# Patient Record
Sex: Female | Born: 1955 | Race: White | Hispanic: No | Marital: Married | Smoking: Never smoker
Health system: Southern US, Community
[De-identification: ages and names within clinical notes are randomized; demographics above are authoritative.]

## PROBLEM LIST (undated history)

## (undated) DIAGNOSIS — K8020 Calculus of gallbladder without cholecystitis without obstruction: Secondary | ICD-10-CM

## (undated) DIAGNOSIS — F411 Generalized anxiety disorder: Secondary | ICD-10-CM

## (undated) DIAGNOSIS — D126 Benign neoplasm of colon, unspecified: Secondary | ICD-10-CM

## (undated) DIAGNOSIS — K219 Gastro-esophageal reflux disease without esophagitis: Secondary | ICD-10-CM

## (undated) DIAGNOSIS — F329 Major depressive disorder, single episode, unspecified: Secondary | ICD-10-CM

## (undated) DIAGNOSIS — K449 Diaphragmatic hernia without obstruction or gangrene: Secondary | ICD-10-CM

## (undated) DIAGNOSIS — I10 Essential (primary) hypertension: Secondary | ICD-10-CM

## (undated) DIAGNOSIS — K589 Irritable bowel syndrome without diarrhea: Secondary | ICD-10-CM

## (undated) DIAGNOSIS — M797 Fibromyalgia: Secondary | ICD-10-CM

## (undated) DIAGNOSIS — K222 Esophageal obstruction: Secondary | ICD-10-CM

## (undated) DIAGNOSIS — C4490 Unspecified malignant neoplasm of skin, unspecified: Secondary | ICD-10-CM

## (undated) DIAGNOSIS — M479 Spondylosis, unspecified: Secondary | ICD-10-CM

## (undated) DIAGNOSIS — B009 Herpesviral infection, unspecified: Secondary | ICD-10-CM

## (undated) DIAGNOSIS — G8929 Other chronic pain: Secondary | ICD-10-CM

## (undated) DIAGNOSIS — D649 Anemia, unspecified: Secondary | ICD-10-CM

## (undated) DIAGNOSIS — E785 Hyperlipidemia, unspecified: Secondary | ICD-10-CM

## (undated) DIAGNOSIS — R51 Headache: Secondary | ICD-10-CM

## (undated) DIAGNOSIS — M1990 Unspecified osteoarthritis, unspecified site: Secondary | ICD-10-CM

## (undated) DIAGNOSIS — F32A Depression, unspecified: Secondary | ICD-10-CM

## (undated) DIAGNOSIS — H269 Unspecified cataract: Secondary | ICD-10-CM

## (undated) DIAGNOSIS — R519 Headache, unspecified: Secondary | ICD-10-CM

## (undated) HISTORY — DX: Other chronic pain: G89.29

## (undated) HISTORY — DX: Benign neoplasm of colon, unspecified: D12.6

## (undated) HISTORY — DX: Diaphragmatic hernia without obstruction or gangrene: K44.9

## (undated) HISTORY — DX: Headache, unspecified: R51.9

## (undated) HISTORY — DX: Unspecified osteoarthritis, unspecified site: M19.90

## (undated) HISTORY — PX: SKIN CANCER EXCISION: SHX779

## (undated) HISTORY — DX: Unspecified malignant neoplasm of skin, unspecified: C44.90

## (undated) HISTORY — DX: Generalized anxiety disorder: F41.1

## (undated) HISTORY — DX: Headache: R51

## (undated) HISTORY — DX: Anemia, unspecified: D64.9

## (undated) HISTORY — DX: Spondylosis, unspecified: M47.9

## (undated) HISTORY — DX: Depression, unspecified: F32.A

## (undated) HISTORY — DX: Major depressive disorder, single episode, unspecified: F32.9

## (undated) HISTORY — DX: Fibromyalgia: M79.7

## (undated) HISTORY — DX: Herpesviral infection, unspecified: B00.9

## (undated) HISTORY — DX: Unspecified cataract: H26.9

## (undated) HISTORY — DX: Hyperlipidemia, unspecified: E78.5

## (undated) HISTORY — DX: Essential (primary) hypertension: I10

## (undated) HISTORY — DX: Irritable bowel syndrome without diarrhea: K58.9

## (undated) HISTORY — DX: Gastro-esophageal reflux disease without esophagitis: K21.9

## (undated) HISTORY — DX: Esophageal obstruction: K22.2

## (undated) HISTORY — DX: Calculus of gallbladder without cholecystitis without obstruction: K80.20

---

## 1979-10-13 HISTORY — PX: TONSILLECTOMY: SUR1361

## 1986-10-12 HISTORY — PX: OTHER SURGICAL HISTORY: SHX169

## 1995-10-13 HISTORY — PX: TUBAL LIGATION: SHX77

## 1997-10-12 HISTORY — PX: ABDOMINAL HYSTERECTOMY: SHX81

## 1998-04-22 ENCOUNTER — Encounter: Admission: RE | Admit: 1998-04-22 | Discharge: 1998-07-21 | Payer: Self-pay | Admitting: Anesthesiology

## 1998-07-26 ENCOUNTER — Encounter: Admission: RE | Admit: 1998-07-26 | Discharge: 1998-10-17 | Payer: Self-pay | Admitting: Anesthesiology

## 1998-09-03 ENCOUNTER — Observation Stay (HOSPITAL_COMMUNITY): Admission: RE | Admit: 1998-09-03 | Discharge: 1998-09-04 | Payer: Self-pay | Admitting: *Deleted

## 1998-10-17 ENCOUNTER — Encounter: Admission: RE | Admit: 1998-10-17 | Discharge: 1999-01-03 | Payer: Self-pay | Admitting: Anesthesiology

## 1999-01-03 ENCOUNTER — Encounter: Admission: RE | Admit: 1999-01-03 | Discharge: 1999-04-03 | Payer: Self-pay | Admitting: Anesthesiology

## 1999-03-17 ENCOUNTER — Other Ambulatory Visit: Admission: RE | Admit: 1999-03-17 | Discharge: 1999-03-17 | Payer: Self-pay | Admitting: *Deleted

## 1999-03-20 ENCOUNTER — Encounter: Admission: RE | Admit: 1999-03-20 | Discharge: 1999-06-18 | Payer: Self-pay | Admitting: Anesthesiology

## 1999-04-09 ENCOUNTER — Encounter: Admission: RE | Admit: 1999-04-09 | Discharge: 1999-07-01 | Payer: Self-pay | Admitting: Critical Care Medicine

## 1999-06-30 ENCOUNTER — Encounter: Admission: RE | Admit: 1999-06-30 | Discharge: 1999-09-28 | Payer: Self-pay | Admitting: Anesthesiology

## 1999-10-17 ENCOUNTER — Encounter: Admission: RE | Admit: 1999-10-17 | Discharge: 1999-12-31 | Payer: Self-pay | Admitting: Anesthesiology

## 2000-01-27 ENCOUNTER — Encounter: Admission: RE | Admit: 2000-01-27 | Discharge: 2000-04-26 | Payer: Self-pay | Admitting: Anesthesiology

## 2000-03-16 ENCOUNTER — Other Ambulatory Visit: Admission: RE | Admit: 2000-03-16 | Discharge: 2000-03-16 | Payer: Self-pay | Admitting: *Deleted

## 2000-05-07 ENCOUNTER — Encounter: Admission: RE | Admit: 2000-05-07 | Discharge: 2000-08-05 | Payer: Self-pay | Admitting: Anesthesiology

## 2000-05-24 ENCOUNTER — Encounter (INDEPENDENT_AMBULATORY_CARE_PROVIDER_SITE_OTHER): Payer: Self-pay | Admitting: *Deleted

## 2001-06-08 ENCOUNTER — Other Ambulatory Visit: Admission: RE | Admit: 2001-06-08 | Discharge: 2001-06-08 | Payer: Self-pay | Admitting: *Deleted

## 2002-06-06 ENCOUNTER — Other Ambulatory Visit: Admission: RE | Admit: 2002-06-06 | Discharge: 2002-06-06 | Payer: Self-pay | Admitting: *Deleted

## 2002-07-18 ENCOUNTER — Encounter: Payer: Self-pay | Admitting: Endocrinology

## 2002-07-18 ENCOUNTER — Encounter: Admission: RE | Admit: 2002-07-18 | Discharge: 2002-07-18 | Payer: Self-pay | Admitting: Endocrinology

## 2002-08-11 ENCOUNTER — Encounter: Admission: RE | Admit: 2002-08-11 | Discharge: 2002-11-09 | Payer: Self-pay

## 2002-12-07 ENCOUNTER — Encounter: Admission: RE | Admit: 2002-12-07 | Discharge: 2003-03-07 | Payer: Self-pay

## 2003-02-16 ENCOUNTER — Encounter: Payer: Self-pay | Admitting: Physical Medicine & Rehabilitation

## 2003-02-16 ENCOUNTER — Encounter
Admission: RE | Admit: 2003-02-16 | Discharge: 2003-02-16 | Payer: Self-pay | Admitting: Physical Medicine & Rehabilitation

## 2003-03-19 ENCOUNTER — Encounter: Admission: RE | Admit: 2003-03-19 | Discharge: 2003-06-17 | Payer: Self-pay | Admitting: Anesthesiology

## 2003-06-26 ENCOUNTER — Encounter
Admission: RE | Admit: 2003-06-26 | Discharge: 2003-09-24 | Payer: Self-pay | Admitting: Physical Medicine & Rehabilitation

## 2003-09-27 ENCOUNTER — Encounter
Admission: RE | Admit: 2003-09-27 | Discharge: 2003-12-26 | Payer: Self-pay | Admitting: Physical Medicine & Rehabilitation

## 2003-12-06 ENCOUNTER — Other Ambulatory Visit: Admission: RE | Admit: 2003-12-06 | Discharge: 2003-12-06 | Payer: Self-pay | Admitting: *Deleted

## 2003-12-20 ENCOUNTER — Encounter
Admission: RE | Admit: 2003-12-20 | Discharge: 2004-03-19 | Payer: Self-pay | Admitting: Physical Medicine & Rehabilitation

## 2004-03-27 ENCOUNTER — Encounter
Admission: RE | Admit: 2004-03-27 | Discharge: 2004-06-25 | Payer: Self-pay | Admitting: Physical Medicine & Rehabilitation

## 2004-06-26 ENCOUNTER — Encounter
Admission: RE | Admit: 2004-06-26 | Discharge: 2004-08-25 | Payer: Self-pay | Admitting: Physical Medicine & Rehabilitation

## 2004-06-27 ENCOUNTER — Ambulatory Visit: Payer: Self-pay | Admitting: Physical Medicine & Rehabilitation

## 2004-08-25 ENCOUNTER — Encounter
Admission: RE | Admit: 2004-08-25 | Discharge: 2004-11-23 | Payer: Self-pay | Admitting: Physical Medicine & Rehabilitation

## 2004-09-16 ENCOUNTER — Ambulatory Visit: Payer: Self-pay | Admitting: Endocrinology

## 2004-11-24 ENCOUNTER — Encounter
Admission: RE | Admit: 2004-11-24 | Discharge: 2005-02-22 | Payer: Self-pay | Admitting: Physical Medicine & Rehabilitation

## 2004-11-25 ENCOUNTER — Ambulatory Visit: Payer: Self-pay | Admitting: Physical Medicine & Rehabilitation

## 2004-12-06 ENCOUNTER — Emergency Department (HOSPITAL_COMMUNITY): Admission: EM | Admit: 2004-12-06 | Discharge: 2004-12-06 | Payer: Self-pay | Admitting: Emergency Medicine

## 2004-12-08 ENCOUNTER — Ambulatory Visit: Payer: Self-pay | Admitting: Endocrinology

## 2004-12-16 ENCOUNTER — Ambulatory Visit: Payer: Self-pay | Admitting: Gastroenterology

## 2004-12-17 ENCOUNTER — Ambulatory Visit: Payer: Self-pay | Admitting: Gastroenterology

## 2004-12-17 HISTORY — PX: OTHER SURGICAL HISTORY: SHX169

## 2005-01-20 ENCOUNTER — Ambulatory Visit: Payer: Self-pay | Admitting: Gastroenterology

## 2005-02-24 ENCOUNTER — Ambulatory Visit: Payer: Self-pay | Admitting: Gastroenterology

## 2005-04-16 ENCOUNTER — Ambulatory Visit: Payer: Self-pay | Admitting: Physical Medicine & Rehabilitation

## 2005-04-16 ENCOUNTER — Encounter
Admission: RE | Admit: 2005-04-16 | Discharge: 2005-07-15 | Payer: Self-pay | Admitting: Physical Medicine & Rehabilitation

## 2005-08-18 ENCOUNTER — Other Ambulatory Visit: Admission: RE | Admit: 2005-08-18 | Discharge: 2005-08-18 | Payer: Self-pay | Admitting: *Deleted

## 2005-08-20 ENCOUNTER — Ambulatory Visit: Payer: Self-pay | Admitting: Endocrinology

## 2005-08-31 ENCOUNTER — Ambulatory Visit: Payer: Self-pay | Admitting: Endocrinology

## 2005-09-10 ENCOUNTER — Encounter
Admission: RE | Admit: 2005-09-10 | Discharge: 2005-12-09 | Payer: Self-pay | Admitting: Physical Medicine & Rehabilitation

## 2005-09-10 ENCOUNTER — Ambulatory Visit: Payer: Self-pay | Admitting: Physical Medicine & Rehabilitation

## 2005-10-02 ENCOUNTER — Ambulatory Visit: Payer: Self-pay | Admitting: Endocrinology

## 2005-10-07 ENCOUNTER — Ambulatory Visit: Payer: Self-pay | Admitting: Endocrinology

## 2005-10-16 ENCOUNTER — Ambulatory Visit: Payer: Self-pay | Admitting: Physical Medicine & Rehabilitation

## 2005-12-30 ENCOUNTER — Ambulatory Visit: Payer: Self-pay | Admitting: Endocrinology

## 2006-01-12 ENCOUNTER — Ambulatory Visit: Payer: Self-pay | Admitting: Physical Medicine & Rehabilitation

## 2006-01-12 ENCOUNTER — Encounter
Admission: RE | Admit: 2006-01-12 | Discharge: 2006-04-12 | Payer: Self-pay | Admitting: Physical Medicine & Rehabilitation

## 2006-02-22 ENCOUNTER — Observation Stay (HOSPITAL_COMMUNITY): Admission: EM | Admit: 2006-02-22 | Discharge: 2006-02-23 | Payer: Self-pay | Admitting: Emergency Medicine

## 2006-02-22 ENCOUNTER — Ambulatory Visit: Payer: Self-pay | Admitting: Internal Medicine

## 2006-02-23 ENCOUNTER — Ambulatory Visit: Payer: Self-pay | Admitting: Internal Medicine

## 2006-03-04 ENCOUNTER — Ambulatory Visit: Payer: Self-pay | Admitting: Physical Medicine & Rehabilitation

## 2006-03-05 ENCOUNTER — Ambulatory Visit: Payer: Self-pay | Admitting: Endocrinology

## 2006-04-08 ENCOUNTER — Ambulatory Visit: Payer: Self-pay | Admitting: Physical Medicine & Rehabilitation

## 2006-05-10 ENCOUNTER — Ambulatory Visit: Payer: Self-pay | Admitting: Physical Medicine & Rehabilitation

## 2006-05-10 ENCOUNTER — Encounter
Admission: RE | Admit: 2006-05-10 | Discharge: 2006-08-08 | Payer: Self-pay | Admitting: Physical Medicine & Rehabilitation

## 2006-05-14 ENCOUNTER — Ambulatory Visit (HOSPITAL_COMMUNITY)
Admission: RE | Admit: 2006-05-14 | Discharge: 2006-05-14 | Payer: Self-pay | Admitting: Physical Medicine and Rehabilitation

## 2006-06-18 ENCOUNTER — Encounter
Admission: RE | Admit: 2006-06-18 | Discharge: 2006-09-16 | Payer: Self-pay | Admitting: Physical Medicine & Rehabilitation

## 2006-06-18 ENCOUNTER — Ambulatory Visit: Payer: Self-pay | Admitting: Physical Medicine & Rehabilitation

## 2006-07-16 ENCOUNTER — Encounter
Admission: RE | Admit: 2006-07-16 | Discharge: 2006-10-14 | Payer: Self-pay | Admitting: Physical Medicine & Rehabilitation

## 2006-07-28 ENCOUNTER — Ambulatory Visit: Payer: Self-pay | Admitting: Endocrinology

## 2006-08-05 ENCOUNTER — Ambulatory Visit: Payer: Self-pay | Admitting: Physical Medicine & Rehabilitation

## 2006-08-16 ENCOUNTER — Other Ambulatory Visit: Admission: RE | Admit: 2006-08-16 | Discharge: 2006-08-16 | Payer: Self-pay | Admitting: *Deleted

## 2006-08-27 ENCOUNTER — Ambulatory Visit: Payer: Self-pay | Admitting: Gastroenterology

## 2006-09-06 ENCOUNTER — Ambulatory Visit: Payer: Self-pay | Admitting: Physical Medicine & Rehabilitation

## 2006-12-03 ENCOUNTER — Encounter
Admission: RE | Admit: 2006-12-03 | Discharge: 2007-03-03 | Payer: Self-pay | Admitting: Physical Medicine & Rehabilitation

## 2006-12-03 ENCOUNTER — Ambulatory Visit: Payer: Self-pay | Admitting: Physical Medicine & Rehabilitation

## 2007-01-10 ENCOUNTER — Ambulatory Visit: Payer: Self-pay | Admitting: Internal Medicine

## 2007-03-22 ENCOUNTER — Ambulatory Visit: Payer: Self-pay | Admitting: Physical Medicine & Rehabilitation

## 2007-03-22 ENCOUNTER — Encounter
Admission: RE | Admit: 2007-03-22 | Discharge: 2007-06-20 | Payer: Self-pay | Admitting: Physical Medicine & Rehabilitation

## 2007-04-13 ENCOUNTER — Encounter
Admission: RE | Admit: 2007-04-13 | Discharge: 2007-07-12 | Payer: Self-pay | Admitting: Physical Medicine & Rehabilitation

## 2007-05-27 ENCOUNTER — Ambulatory Visit: Payer: Self-pay | Admitting: Endocrinology

## 2007-05-27 HISTORY — PX: ELECTROCARDIOGRAM: SHX264

## 2007-06-10 ENCOUNTER — Encounter: Payer: Self-pay | Admitting: Endocrinology

## 2007-06-10 DIAGNOSIS — I10 Essential (primary) hypertension: Secondary | ICD-10-CM | POA: Insufficient documentation

## 2007-06-10 DIAGNOSIS — K219 Gastro-esophageal reflux disease without esophagitis: Secondary | ICD-10-CM

## 2007-06-10 DIAGNOSIS — F411 Generalized anxiety disorder: Secondary | ICD-10-CM

## 2007-06-10 DIAGNOSIS — M1990 Unspecified osteoarthritis, unspecified site: Secondary | ICD-10-CM

## 2007-06-10 HISTORY — DX: Gastro-esophageal reflux disease without esophagitis: K21.9

## 2007-06-10 HISTORY — DX: Essential (primary) hypertension: I10

## 2007-06-10 HISTORY — DX: Generalized anxiety disorder: F41.1

## 2007-06-10 HISTORY — DX: Unspecified osteoarthritis, unspecified site: M19.90

## 2007-07-21 ENCOUNTER — Encounter
Admission: RE | Admit: 2007-07-21 | Discharge: 2007-07-22 | Payer: Self-pay | Admitting: Physical Medicine & Rehabilitation

## 2007-07-21 ENCOUNTER — Ambulatory Visit: Payer: Self-pay | Admitting: Physical Medicine & Rehabilitation

## 2007-08-16 ENCOUNTER — Other Ambulatory Visit: Admission: RE | Admit: 2007-08-16 | Discharge: 2007-08-16 | Payer: Self-pay | Admitting: *Deleted

## 2007-09-14 ENCOUNTER — Encounter: Admission: RE | Admit: 2007-09-14 | Discharge: 2007-09-14 | Payer: Self-pay | Admitting: Endocrinology

## 2007-11-08 ENCOUNTER — Encounter
Admission: RE | Admit: 2007-11-08 | Discharge: 2007-11-09 | Payer: Self-pay | Admitting: Physical Medicine & Rehabilitation

## 2007-11-08 ENCOUNTER — Ambulatory Visit: Payer: Self-pay | Admitting: Physical Medicine & Rehabilitation

## 2007-12-30 ENCOUNTER — Encounter: Admission: RE | Admit: 2007-12-30 | Discharge: 2007-12-30 | Payer: Self-pay | Admitting: Endocrinology

## 2008-01-30 ENCOUNTER — Encounter
Admission: RE | Admit: 2008-01-30 | Discharge: 2008-04-23 | Payer: Self-pay | Admitting: Physical Medicine & Rehabilitation

## 2008-02-01 ENCOUNTER — Ambulatory Visit: Payer: Self-pay | Admitting: Physical Medicine & Rehabilitation

## 2008-02-16 ENCOUNTER — Encounter: Payer: Self-pay | Admitting: Endocrinology

## 2008-02-21 ENCOUNTER — Encounter: Payer: Self-pay | Admitting: Endocrinology

## 2008-04-23 ENCOUNTER — Ambulatory Visit: Payer: Self-pay | Admitting: Physical Medicine & Rehabilitation

## 2008-04-25 ENCOUNTER — Encounter (INDEPENDENT_AMBULATORY_CARE_PROVIDER_SITE_OTHER): Payer: Self-pay | Admitting: General Surgery

## 2008-04-25 ENCOUNTER — Ambulatory Visit (HOSPITAL_COMMUNITY): Admission: RE | Admit: 2008-04-25 | Discharge: 2008-04-25 | Payer: Self-pay | Admitting: General Surgery

## 2008-04-25 HISTORY — PX: CHOLECYSTECTOMY: SHX55

## 2008-06-28 ENCOUNTER — Ambulatory Visit: Payer: Self-pay | Admitting: Endocrinology

## 2008-06-28 DIAGNOSIS — B009 Herpesviral infection, unspecified: Secondary | ICD-10-CM | POA: Insufficient documentation

## 2008-06-28 DIAGNOSIS — R739 Hyperglycemia, unspecified: Secondary | ICD-10-CM | POA: Insufficient documentation

## 2008-06-28 DIAGNOSIS — E785 Hyperlipidemia, unspecified: Secondary | ICD-10-CM | POA: Insufficient documentation

## 2008-06-28 DIAGNOSIS — K589 Irritable bowel syndrome without diarrhea: Secondary | ICD-10-CM

## 2008-06-28 HISTORY — DX: Hyperlipidemia, unspecified: E78.5

## 2008-06-28 HISTORY — DX: Irritable bowel syndrome, unspecified: K58.9

## 2008-06-28 HISTORY — DX: Herpesviral infection, unspecified: B00.9

## 2008-07-13 ENCOUNTER — Encounter
Admission: RE | Admit: 2008-07-13 | Discharge: 2008-10-11 | Payer: Self-pay | Admitting: Physical Medicine & Rehabilitation

## 2008-07-16 ENCOUNTER — Ambulatory Visit: Payer: Self-pay | Admitting: Physical Medicine & Rehabilitation

## 2008-08-14 ENCOUNTER — Ambulatory Visit: Payer: Self-pay | Admitting: Physical Medicine & Rehabilitation

## 2008-09-11 ENCOUNTER — Ambulatory Visit: Payer: Self-pay | Admitting: Physical Medicine & Rehabilitation

## 2008-09-17 ENCOUNTER — Ambulatory Visit: Payer: Self-pay | Admitting: Endocrinology

## 2008-10-03 ENCOUNTER — Ambulatory Visit: Payer: Self-pay | Admitting: Endocrinology

## 2008-11-13 ENCOUNTER — Ambulatory Visit: Payer: Self-pay | Admitting: Physical Medicine & Rehabilitation

## 2008-11-13 ENCOUNTER — Encounter
Admission: RE | Admit: 2008-11-13 | Discharge: 2009-02-11 | Payer: Self-pay | Admitting: Physical Medicine & Rehabilitation

## 2008-12-14 ENCOUNTER — Ambulatory Visit: Payer: Self-pay | Admitting: Endocrinology

## 2009-01-08 ENCOUNTER — Ambulatory Visit: Payer: Self-pay | Admitting: Physical Medicine & Rehabilitation

## 2009-03-25 ENCOUNTER — Ambulatory Visit: Payer: Self-pay | Admitting: Internal Medicine

## 2009-03-26 DIAGNOSIS — J069 Acute upper respiratory infection, unspecified: Secondary | ICD-10-CM | POA: Insufficient documentation

## 2009-03-29 ENCOUNTER — Encounter
Admission: RE | Admit: 2009-03-29 | Discharge: 2009-04-02 | Payer: Self-pay | Admitting: Physical Medicine & Rehabilitation

## 2009-04-02 ENCOUNTER — Ambulatory Visit: Payer: Self-pay | Admitting: Endocrinology

## 2009-04-02 ENCOUNTER — Ambulatory Visit: Payer: Self-pay | Admitting: Physical Medicine & Rehabilitation

## 2009-04-02 ENCOUNTER — Telehealth (INDEPENDENT_AMBULATORY_CARE_PROVIDER_SITE_OTHER): Payer: Self-pay | Admitting: *Deleted

## 2009-04-02 DIAGNOSIS — R059 Cough, unspecified: Secondary | ICD-10-CM | POA: Insufficient documentation

## 2009-04-02 DIAGNOSIS — R05 Cough: Secondary | ICD-10-CM

## 2009-06-21 ENCOUNTER — Encounter
Admission: RE | Admit: 2009-06-21 | Discharge: 2009-06-25 | Payer: Self-pay | Admitting: Physical Medicine & Rehabilitation

## 2009-06-25 ENCOUNTER — Ambulatory Visit: Payer: Self-pay | Admitting: Physical Medicine & Rehabilitation

## 2009-07-02 ENCOUNTER — Encounter: Payer: Self-pay | Admitting: Endocrinology

## 2009-07-04 ENCOUNTER — Ambulatory Visit: Payer: Self-pay | Admitting: Endocrinology

## 2009-07-04 ENCOUNTER — Encounter: Admission: RE | Admit: 2009-07-04 | Discharge: 2009-07-04 | Payer: Self-pay | Admitting: Endocrinology

## 2009-07-04 DIAGNOSIS — L259 Unspecified contact dermatitis, unspecified cause: Secondary | ICD-10-CM | POA: Insufficient documentation

## 2009-07-12 ENCOUNTER — Ambulatory Visit: Payer: Self-pay | Admitting: Endocrinology

## 2009-07-12 LAB — CONVERTED CEMR LAB
ALT: 16 units/L (ref 0–35)
AST: 23 units/L (ref 0–37)
BUN: 8 mg/dL (ref 6–23)
Basophils Absolute: 0.1 10*3/uL (ref 0.0–0.1)
Basophils Relative: 1.4 % (ref 0.0–3.0)
CO2: 30 meq/L (ref 19–32)
Calcium: 9.2 mg/dL (ref 8.4–10.5)
Chloride: 106 meq/L (ref 96–112)
Cholesterol: 264 mg/dL — ABNORMAL HIGH (ref 0–200)
Creatinine, Ser: 0.8 mg/dL (ref 0.4–1.2)
HCT: 38.7 % (ref 36.0–46.0)
HDL: 52.4 mg/dL (ref >39.00)
Ketones, ur: NEGATIVE mg/dL
Monocytes Relative: 4.9 % (ref 3.0–12.0)
Neutrophils Relative %: 64.8 % (ref 43.0–77.0)
Nitrite: NEGATIVE
Platelets: 192 10*3/uL (ref 150.0–400.0)
Potassium: 4.1 meq/L (ref 3.5–5.1)
RDW: 13.5 % (ref 11.5–14.6)
Sodium: 142 meq/L (ref 135–145)
Total Protein: 6.6 g/dL (ref 6.0–8.3)
Triglycerides: 275 mg/dL — ABNORMAL HIGH (ref 0.0–149.0)
pH: 6 (ref 5.0–8.0)

## 2009-07-19 ENCOUNTER — Ambulatory Visit: Payer: Self-pay | Admitting: Endocrinology

## 2009-07-19 DIAGNOSIS — Z780 Asymptomatic menopausal state: Secondary | ICD-10-CM | POA: Insufficient documentation

## 2009-08-15 ENCOUNTER — Ambulatory Visit: Payer: Self-pay | Admitting: Endocrinology

## 2009-08-15 ENCOUNTER — Ambulatory Visit: Payer: Self-pay | Admitting: Internal Medicine

## 2009-08-17 LAB — CONVERTED CEMR LAB
AST: 27 units/L (ref 0–37)
Alkaline Phosphatase: 65 units/L (ref 39–117)
BUN: 12 mg/dL (ref 6–23)
CO2: 31 meq/L (ref 19–32)
Calcium: 9.3 mg/dL (ref 8.4–10.5)
Chloride: 104 meq/L (ref 96–112)
Cholesterol: 184 mg/dL (ref 0–200)
GFR calc non Af Amer: 79.96 mL/min (ref >60)
HDL: 51.2 mg/dL (ref >39.00)
Total Bilirubin: 0.6 mg/dL (ref 0.3–1.2)
Total Protein: 6.9 g/dL (ref 6.0–8.3)
Triglycerides: 186 mg/dL — ABNORMAL HIGH (ref 0.0–149.0)

## 2009-10-15 ENCOUNTER — Encounter
Admission: RE | Admit: 2009-10-15 | Discharge: 2010-01-13 | Payer: Self-pay | Admitting: Physical Medicine & Rehabilitation

## 2009-10-15 ENCOUNTER — Ambulatory Visit: Payer: Self-pay | Admitting: Physical Medicine & Rehabilitation

## 2010-01-10 ENCOUNTER — Ambulatory Visit: Payer: Self-pay | Admitting: Physical Medicine & Rehabilitation

## 2010-01-21 ENCOUNTER — Ambulatory Visit: Payer: Self-pay | Admitting: Endocrinology

## 2010-01-21 DIAGNOSIS — R635 Abnormal weight gain: Secondary | ICD-10-CM | POA: Insufficient documentation

## 2010-04-03 ENCOUNTER — Encounter
Admission: RE | Admit: 2010-04-03 | Discharge: 2010-04-09 | Payer: Self-pay | Admitting: Physical Medicine & Rehabilitation

## 2010-04-09 ENCOUNTER — Ambulatory Visit: Payer: Self-pay | Admitting: Physical Medicine & Rehabilitation

## 2010-07-21 ENCOUNTER — Encounter
Admission: RE | Admit: 2010-07-21 | Discharge: 2010-07-28 | Payer: Self-pay | Source: Home / Self Care | Attending: Physical Medicine & Rehabilitation | Admitting: Physical Medicine & Rehabilitation

## 2010-07-23 ENCOUNTER — Telehealth: Payer: Self-pay | Admitting: Endocrinology

## 2010-07-28 ENCOUNTER — Ambulatory Visit: Payer: Self-pay | Admitting: Physical Medicine & Rehabilitation

## 2010-08-04 ENCOUNTER — Ambulatory Visit: Payer: Self-pay | Admitting: Endocrinology

## 2010-08-04 ENCOUNTER — Encounter: Admission: RE | Admit: 2010-08-04 | Discharge: 2010-08-04 | Payer: Self-pay | Admitting: Endocrinology

## 2010-08-04 LAB — CONVERTED CEMR LAB
ALT: 21 units/L (ref 0–35)
Alkaline Phosphatase: 65 units/L (ref 39–117)
BUN: 16 mg/dL (ref 6–23)
Basophils Relative: 0.3 % (ref 0.0–3.0)
Bilirubin Urine: NEGATIVE
Bilirubin, Direct: 0.1 mg/dL (ref 0.0–0.3)
CO2: 29 meq/L (ref 19–32)
Cholesterol: 215 mg/dL — ABNORMAL HIGH (ref 0–200)
Eosinophils Absolute: 0.1 10*3/uL (ref 0.0–0.7)
GFR calc non Af Amer: 78.53 mL/min (ref >60)
HDL: 57.1 mg/dL (ref >39.00)
Hemoglobin: 13.5 g/dL (ref 12.0–15.0)
Ketones, ur: NEGATIVE mg/dL
MCV: 85.2 fL (ref 78.0–100.0)
Monocytes Absolute: 0.3 10*3/uL (ref 0.1–1.0)
Neutro Abs: 4.7 10*3/uL (ref 1.4–7.7)
Neutrophils Relative %: 72.7 % (ref 43.0–77.0)
Nitrite: NEGATIVE
Platelets: 230 10*3/uL (ref 150.0–400.0)
RBC: 4.61 M/uL (ref 3.87–5.11)
Sodium: 137 meq/L (ref 135–145)
Specific Gravity, Urine: 1.025 (ref 1.000–1.030)
TSH: 1.71 microintl units/mL (ref 0.35–5.50)
Total Bilirubin: 0.5 mg/dL (ref 0.3–1.2)
Total Protein, Urine: NEGATIVE mg/dL
Total Protein: 6.8 g/dL (ref 6.0–8.3)
Triglycerides: 234 mg/dL — ABNORMAL HIGH (ref 0.0–149.0)
Urine Glucose: NEGATIVE mg/dL

## 2010-08-12 ENCOUNTER — Ambulatory Visit: Payer: Self-pay | Admitting: Endocrinology

## 2010-08-12 ENCOUNTER — Encounter: Payer: Self-pay | Admitting: Endocrinology

## 2010-10-16 ENCOUNTER — Encounter
Admission: RE | Admit: 2010-10-16 | Discharge: 2010-10-20 | Payer: Self-pay | Source: Home / Self Care | Attending: Physical Medicine & Rehabilitation | Admitting: Physical Medicine & Rehabilitation

## 2010-10-20 ENCOUNTER — Ambulatory Visit
Admission: RE | Admit: 2010-10-20 | Discharge: 2010-10-20 | Payer: Self-pay | Source: Home / Self Care | Attending: Physical Medicine & Rehabilitation | Admitting: Physical Medicine & Rehabilitation

## 2010-10-20 ENCOUNTER — Ambulatory Visit (HOSPITAL_COMMUNITY)
Admission: RE | Admit: 2010-10-20 | Discharge: 2010-10-20 | Payer: Self-pay | Source: Home / Self Care | Attending: Physical Medicine & Rehabilitation | Admitting: Physical Medicine & Rehabilitation

## 2010-11-02 ENCOUNTER — Encounter: Payer: Self-pay | Admitting: Physical Medicine & Rehabilitation

## 2010-11-11 NOTE — Assessment & Plan Note (Signed)
Summary: CPX /  FLU SHOT /NWS #   Vital Signs:  Patient profile:   55 year old female Menstrual status:  hysterectomy Height:      61 inches (154.94 cm) Weight:      154 pounds (70 kg) BMI:     29.20 O2 Sat:      97 % on Room air Temp:     98.4 degrees F (36.89 degrees C) oral Pulse rate:   91 / minute BP sitting:   120 / 72  (left arm) Cuff size:   regular  Vitals Entered By: Brenton Grills CMA Duncan Dull) (August 12, 2010 1:39 PM)  O2 Flow:  Room air CC: Physical/pt is no longer taking Gabapentin or using Lidex cream/aj Is Patient Diabetic? No Comments pt had mammogram in October, results not back yet, pt is due for pap every 2 years     Menstrual Status hysterectomy   CC:  Physical/pt is no longer taking Gabapentin or using Lidex cream/aj.  History of Present Illness: here for regular wellness examination.  He's feeling pretty well in general, and does not drink or smoke.  Current Medications (verified): 1)  Acyclovir 800 Mg  Tabs (Acyclovir) .... Take 1 By Mouth Qd 2)  Alprazolam 0.5 Mg  Tabs (Alprazolam) .... Take 1 By Mouth Three Times A Day 3)  Hydrocodone-Acetaminophen 10-325 Mg Tabs (Hydrocodone-Acetaminophen) .Marland Kitchen.. 1 Every 4-6 Hours As Needed 4)  Gabapentin 300 Mg Caps (Gabapentin) .... Take 1 Capsule By Mouth At Bedtime 5)  Venlafaxine Hcl 75 Mg Tabs (Venlafaxine Hcl) .... Take 1 Tablet By Mouth Three Times A Day 6)  Screening Mammogram 7)  Lidex 0.05 % Crea (Fluocinonide) .... Three Times A Day As Needed Itching 8)  Pravastatin Sodium 40 Mg Tabs (Pravastatin Sodium) .Marland Kitchen.. 1 Qhs 9)  Lisinopril-Hydrochlorothiazide 10-12.5 Mg Tabs (Lisinopril-Hydrochlorothiazide) .... 1/2 Qd  Allergies (verified): No Known Drug Allergies  Past History:  Past Medical History: Last updated: 06/10/2007 Anxiety GERD Hypertension Osteoarthritis L-Spine Hyperglycemia Insomnia IBS Herpes Simplex (R) Buttock  Family History: Reviewed history from 06/28/2008 and no changes  required. father had colon cancer mother had cervical cancer  Social History: Reviewed history from 07/19/2009 and no changes required. married unemployed  Review of Systems  The patient denies fever, vision loss, decreased hearing, chest pain, syncope, dyspnea on exertion, prolonged cough, headaches, abdominal pain, melena, hematochezia, severe indigestion/heartburn, hematuria, and suspicious skin lesions.         she has few lbs weight gain.  depression is well-controlled  Physical Exam  General:  normal appearance.   Head:  head: no deformity eyes: no periorbital swelling, no proptosis external nose and ears are normal mouth: no lesion seen Neck:  Supple without thyroid enlargement or tenderness.  Breasts:  sees gyn Lungs:  Clear to auscultation bilaterally. Normal respiratory effort.  Heart:  Regular rate and rhythm without murmurs or gallops noted. Normal S1,S2.   Abdomen:  abdomen is soft, nontender.  no hepatosplenomegaly.   not distended.  no hernia  Rectal:  sees gyn  Genitalia:  sees gyn Msk:  muscle bulk and strength are grossly normal.  no obvious joint swelling.  gait is normal and steady  Pulses:  dorsalis pedis intact bilat.  no carotid bruit  Extremities:  no deformity.  no ulcer on the feet.  feet are of normal color and temp.  no edema  Neurologic:  cn 2-12 grossly intact.   readily moves all 4's.   sensation is intact to touch on  the feet Skin:  normal texture and temp.  no rash.  not diaphoretic  Cervical Nodes:  No significant adenopathy.  Psych:  Alert and cooperative; normal mood and affect; normal attention span and concentration.     Impression & Recommendations:  Problem # 1:  ROUTINE GENERAL MEDICAL EXAM@HEALTH  CARE FACL (ICD-V70.0)  Medications Added to Medication List This Visit: 1)  Alprazolam 0.5 Mg Tabs (Alprazolam) .... Take 1 by mouth three times a day (dr [name]) 2)  Hydrocodone-acetaminophen 10-325 Mg Tabs  (Hydrocodone-acetaminophen) .Marland Kitchen.. 1 every 4-6 hours as needed (dr [name]) 3)  Venlafaxine Hcl 225 Mg Xr24h-tab (Venlafaxine hcl) .Marland Kitchen.. 1 tab once daily  Other Orders: Admin 1st Vaccine (04540) Flu Vaccine 38yrs + (98119) EKG w/ Interpretation (93000) Est. Patient 40-64 years [phone])  Preventive Care Screening     gyn is dr Chevis Pretty   Patient Instructions: 1)  go to labs in 6 months for a1c 790.6.  2)  redouble diet efforts. 3)  Please schedule a follow-up appointment in 1 year. 4)  please consider these measures for your health:  minimize alcohol.  do not use tobacco products.  have a colonoscopy at least every 10 years from age 32.  keep firearms safely stored.  always use seat belts.  have working smoke alarms in your home.  see an eye doctor and dentist regularly.  never drive under the influence of alcohol or drugs (including prescription drugs).  those with fair skin should take precautions against the sun. 5)  please let me know what your wishes would be, if artificial life support measures should become necessary.  it is critically important to prevent falling down (keep floor areas well-lit, dry, and free of loose objects). 6)  (update: we discussed code status.  pt requests full code, but would not want to be started or maintained on artificial life-support measures if there was not a reasonable chance of recovery) Prescriptions: VENLAFAXINE HCL 225 MG XR24H-TAB (VENLAFAXINE HCL) 1 tab once daily  #90 x 3   Entered and Authorized by:   Minus Breeding MD   Signed by:   Minus Breeding MD on 08/12/2010   Method used:   Electronically to        [facility] [phone]* (retail)       [address]       [place], [place]  [address]       Ph: [phone] or [phone]       Fax: [phone]   RxID:   [id number]    Orders Added: 1)  Admin 1st Vaccine [90471] 2)  Flu Vaccine 89yrs + [33295] 3)  EKG w/ Interpretation [93000] 4)  Est. Patient 40-64  years [18841]  Flu Vaccine Consent Questions     Do you have a history of severe allergic reactions to this vaccine? no    Any prior history of allergic reactions to egg and/or gelatin? no    Do you have a sensitivity to the preservative Thimersol? no    Do you have a past history of Guillan-Barre Syndrome? no    Do you currently have an acute febrile illness? no    Have you ever had a severe reaction to latex? no    Vaccine information given and explained to patient? yes    Are you currently pregnant? no    Lot Number:[id number]   Exp Date:04/11/2011   Site Given  Right Deltoid IM    .[email or web address]  Preventive Care Screening     gyn is dr Chevis Pretty

## 2010-11-11 NOTE — Assessment & Plan Note (Signed)
Summary: FU--WEIGHT GAIN AND BP  STC   Vital Signs:  Patient profile:   55 year old female Height:      61 inches (165.10 cm) Weight:      159.38 pounds (72.45 kg) BMI:     30.22 O2 Sat:      95 % on Room air Temp:     98.6 degrees F (37.00 degrees C) oral Pulse rate:   101 / minute BP sitting:   112 / 80  (left arm) Cuff size:   regular  Vitals Entered By: Josph Macho RMA (January 21, 2010 1:12 PM)  O2 Flow:  Room air CC: Follow-up visit- weight gain and bp/ CF   CC:  Follow-up visit- weight gain and bp/ CF.  History of Present Illness: she takes lisinopril-hct as rx'ed.  she has ocasional dizziness, but bp was elevated at sr [name]' office recently.   she says weight gain drives her fatigue.  Current Medications (verified): 1)  Acyclovir 800 Mg  Tabs (Acyclovir) .... Take 1 By Mouth Qd 2)  Alprazolam 0.5 Mg  Tabs (Alprazolam) .... Take 1 By Mouth Three Times A Day Qd 3)  Hydrocodone-Acetaminophen 10-325 Mg Tabs (Hydrocodone-Acetaminophen) .Marland Kitchen.. 1 Every 4-6 Hours As Needed 4)  Gabapentin 300 Mg Caps (Gabapentin) .... Take 1 Capsule By Mouth At Bedtime 5)  Venlafaxine Hcl 75 Mg Tabs (Venlafaxine Hcl) .... Take 1 Tablet By Mouth Three Times A Day 6)  Screening Mammogram 7)  Lidex 0.05 % Crea (Fluocinonide) .... Three Times A Day As Needed Itching 8)  Pravastatin Sodium 40 Mg Tabs (Pravastatin Sodium) .Marland Kitchen.. 1 Qhs 9)  Lisinopril-Hydrochlorothiazide 10-12.5 Mg Tabs (Lisinopril-Hydrochlorothiazide) .... 1/2 Qd  Allergies (verified): No Known Drug Allergies  Past History:  Past Medical History: Last updated: 06/10/2007 Anxiety GERD Hypertension Osteoarthritis L-Spine Hyperglycemia Insomnia IBS Herpes Simplex (R) Buttock  Review of Systems  The patient denies syncope.    Physical Exam  General:  normal appearance.   Extremities:  no edema   Impression & Recommendations:  Problem # 1:  HYPERTENSION (ICD-401.9) apparently with an episodic component  Problem  # 2:  WEIGHT GAIN (ICD-783.1) Assessment: Deteriorated  Other Orders: Surgical Referral (Surgery) TLB-TSH (Thyroid Stimulating Hormone) (84443-TSH) Est. Patient Level III (27253)  Patient Instructions: 1)  tests are being ordered for you today.  please call [phone] to hear your test results. 2)  pending the test results, please continue the same medications for now. 3)  refer for weight-loss surgery.  you will be called with a day and time for an appointment. 4)  you should take "Shirlean Kelly" medication, in the meantime. 5)  (update: i left message on phone-tree:  rx as we discussed)

## 2010-11-11 NOTE — Progress Notes (Signed)
Summary: rx refill req  Phone Note Refill Request Message from:  Fax from Pharmacy on July 23, 2010 12:10 PM  Refills Requested: Medication #1:  ALPRAZOLAM 0.5 MG  TABS take 1 by mouth three times a day qd   Dosage confirmed as above?Dosage Confirmed   Last Refilled: 06/24/2010 please advise   Method Requested: Electronic Initial call taken by: Brenton Grills MA,  July 23, 2010 12:11 PM  Follow-up for Phone Call        i printed rx x 1 cpx is due Follow-up by: Minus Breeding MD,  July 23, 2010 1:07 PM  Additional Follow-up for Phone Call Additional follow up Details #1::        Rx faxed to pharmacy left detailed message for pt to schedule CPX Additional Follow-up by: Brenton Grills MA,  July 23, 2010 2:42 PM    New/Updated Medications: ALPRAZOLAM 0.5 MG  TABS (ALPRAZOLAM) take 1 by mouth three times a day Prescriptions: ALPRAZOLAM 0.5 MG  TABS (ALPRAZOLAM) take 1 by mouth three times a day  #100 x 0   Entered and Authorized by:   Minus Breeding MD   Signed by:   Minus Breeding MD on 07/23/2010   Method used:   Print then Give to Patient   RxID:   [id number]

## 2010-12-29 ENCOUNTER — Ambulatory Visit: Payer: 59 | Admitting: Neurosurgery

## 2011-01-12 ENCOUNTER — Encounter: Payer: 59 | Attending: Physical Medicine & Rehabilitation | Admitting: Physical Medicine & Rehabilitation

## 2011-01-12 DIAGNOSIS — IMO0002 Reserved for concepts with insufficient information to code with codable children: Secondary | ICD-10-CM | POA: Insufficient documentation

## 2011-01-12 DIAGNOSIS — M47812 Spondylosis without myelopathy or radiculopathy, cervical region: Secondary | ICD-10-CM

## 2011-01-12 DIAGNOSIS — M5380 Other specified dorsopathies, site unspecified: Secondary | ICD-10-CM

## 2011-01-12 DIAGNOSIS — M545 Low back pain, unspecified: Secondary | ICD-10-CM | POA: Insufficient documentation

## 2011-01-12 DIAGNOSIS — F341 Dysthymic disorder: Secondary | ICD-10-CM | POA: Insufficient documentation

## 2011-01-12 DIAGNOSIS — S6390XA Sprain of unspecified part of unspecified wrist and hand, initial encounter: Secondary | ICD-10-CM

## 2011-01-12 DIAGNOSIS — M47817 Spondylosis without myelopathy or radiculopathy, lumbosacral region: Secondary | ICD-10-CM | POA: Insufficient documentation

## 2011-01-12 DIAGNOSIS — M79609 Pain in unspecified limb: Secondary | ICD-10-CM | POA: Insufficient documentation

## 2011-02-10 NOTE — Assessment & Plan Note (Signed)
[name] is back regarding her back pain.  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  She has had persistent pain particularly in the thigh and low back when she walks for long periods of time.  She feels that her left leg loses control unexpectedly at times also.  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  Pain is about a 9-10/10 on average.  Pain is sharp burning, stabbing, tingling, and aching.  She does still pain is improving.  Sleep is fair.  REVIEW OF SYSTEMS:  Notable for the above.  She does have some anxiety, confusion, weight gain overall.  A full 12-point review is in written health history section of the chart.  SOCIAL HISTORY:  Unchanged.  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  PHYSICAL EXAMINATION:  VITAL SIGNS:  Blood pressure is 126/66, pulse 100, respiratory rate 18, and she is saturating 97% on room air. GENERAL:  The patient is pleasant, alert.  Weight is stable. MUSCULOSKELETAL:  She has some pain with palpation at the right PSIS more than left PSIS joint.  There is generalized lumbar pain with palpation.  She has some elevation of the right hemipelvis and mild level scoliosis in the thoracic spine.  Strength is 5/5 in all 4 limbs. Reflexes are brisk at 2+.  Sensory exam findings were seen today. Straight leg raising cause hamstring discomfort on either leg.  With straight leg raising on the right, she had pain in the left lower back. Luisa Hart testing was equivocal. HEART:  Regular. CHEST:  Clear. ABDOMEN:  Soft and nontender.  ASSESSMENT: 1. History of chronic lumbar facet disease with spondylosis. 2. Left knee meniscus injury. 3.  Depression with anxiety. 4. Acute low back pain suspicious for disk injury with some transient     associated radiculopathy, perhaps at S1 on the left.  PLAN: 1. The patient does seem to be improving.  I would stay conservative     for now.  She does not tolerate any nonsteroidal anti-     inflammatories.  We will try a muscle relaxant, Flexeril 5 mg q.8 h     p.r.n. 2. I want her to continue with her extension exercises as directed by     physical therapy.  These seemed to be helping.  She can try using a     cane if she wants to for longer distance ambulation in case her leg     "gives out." 3. I would also utilize ice and heat. 4. She is to utilize good posture with sitting, standing, walking, and     with her work with the baby as well. 5. If symptoms do not improve over the next few weeks, we will     consider further imaging, but I do expect that to be the case.     [name], M.D. Electronically Signed    ZTS/MedQ D:  01/12/2011 11:38:38  T:  01/13/2011 01:59:06  Job #:  [id number]  cc:   Gregary Signs A. Everardo All, MD [address] [place] [address]

## 2011-02-11 ENCOUNTER — Other Ambulatory Visit (INDEPENDENT_AMBULATORY_CARE_PROVIDER_SITE_OTHER): Payer: 59

## 2011-02-11 ENCOUNTER — Encounter: Payer: 59 | Attending: Physical Medicine & Rehabilitation | Admitting: Physical Medicine & Rehabilitation

## 2011-02-11 DIAGNOSIS — R7989 Other specified abnormal findings of blood chemistry: Secondary | ICD-10-CM

## 2011-02-11 DIAGNOSIS — F341 Dysthymic disorder: Secondary | ICD-10-CM | POA: Insufficient documentation

## 2011-02-11 DIAGNOSIS — M545 Low back pain, unspecified: Secondary | ICD-10-CM | POA: Insufficient documentation

## 2011-02-11 DIAGNOSIS — M23302 Other meniscus derangements, unspecified lateral meniscus, unspecified knee: Secondary | ICD-10-CM | POA: Insufficient documentation

## 2011-02-11 DIAGNOSIS — M5382 Other specified dorsopathies, cervical region: Secondary | ICD-10-CM

## 2011-02-11 DIAGNOSIS — S6390XA Sprain of unspecified part of unspecified wrist and hand, initial encounter: Secondary | ICD-10-CM

## 2011-02-11 DIAGNOSIS — M47817 Spondylosis without myelopathy or radiculopathy, lumbosacral region: Secondary | ICD-10-CM | POA: Insufficient documentation

## 2011-02-11 DIAGNOSIS — M47812 Spondylosis without myelopathy or radiculopathy, cervical region: Secondary | ICD-10-CM

## 2011-02-11 DIAGNOSIS — M5380 Other specified dorsopathies, site unspecified: Secondary | ICD-10-CM

## 2011-02-12 NOTE — Assessment & Plan Note (Signed)
[name] is back regarding her low back pain and multiple pain complaints. 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  She has worked on extension exercises, these have helped a lot of her pain and standing. She still has a hard time when she bends or asked to stand from a seated position.  She is trying to work with how she holds the baby as well. She has pain in the low back into the left buttock region.  She has occasional tingling in the feet.  Pain is about 7-8/10.  The Flexeril helps with spasm, but she noticed some visual phenomenon through right eye and when she stops medication use, these stop as well.  REVIEW OF SYSTEMS:  Notable for the above.  She does report some confusion, depression, ongoing weight gain, anxiety, etc.  Full 12-point review is in the written health and history section of the chart.  SOCIAL HISTORY:  Unchanged.  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  PHYSICAL EXAMINATION:  VITAL SIGNS:  Blood pressure is 126/66, pulse is 100, respiratory rate 18, she is satting 100% on room air. GENERAL:  The patient is pleasant, alert, and oriented. MUSCULOSKELETAL:  She flexed for me and was able to bend to about 75-80 degrees, but not really any low back pain.  She had some tightness in the low back and tightness in the shoulder regions and pain in the right lower quadrant of the abdomen.  She denied any knee pain with this movement.  Extension did not cause any appreciable pain.  She only had mild-to-moderate pain with facet maneuvering.  Patrick's testing was negative. HEART:  Regular. CHEST:  Clear. ABDOMEN:  Soft, nontender. NEUROLOGIC:  Motor and sensory exam was normal.  Reflexes were 2+.  ASSESSMENT: 1. History of chronic lumbar facet disease with spondylosis. 2. Left knee meniscus injury. 3. Depression with anxiety.  PLAN: 1. The patient seems to have improved a bit.  She  has some chronic     back issues.  I talked to her today about the fact that I feel that     she could get by with less narcotic medication than she is using     and that we need to focus more on modalities and exercise as well     as weight loss to treat her back.  I do not see any signs of     persistent radiculopathy on exam, although she has some tingling in     both legs, left more than right. 2. She will focus on some flexion exercises as well.  I would like to     send her for formal physical therapy if possible and she will let     me know when her schedule will allow this. 3. She will see Dr. Everardo All, regarding basic medical followup.  She     mentioned feeling tired all the time, and  I asked her to review     her hormonal levels with Dr. Everardo All.  I am sure he will make     appropriate     recommendations and treatments. 4. I will see her back in about 4 months.  She will call me with any     problems or questions.     [name], M.D. Electronically Signed    ZTS/MedQ D:  02/11/2011 11:55:44  T:  02/11/2011 23:51:53  Job #:  [id number]  cc:   Sean A. Everardo All, MD [address] [place] [address]

## 2011-02-24 NOTE — Assessment & Plan Note (Signed)
[name] is back regarding her low back pain and cervical spondylosis.  She  had continued to have issues regarding her teeth and she requires  multiple dental surgeries over the next several months.  She is having a  lot of pain as a result of some of the things that have already been  done including multiple removals of cavities, teeth, crowns, etc.  Neurontin has not helped the whole lot with her pain, although it is  hard to say precisely as she is having a lot of procedures.  She rates  her pain 8 out of 10,  describes as burning, tingling, and aching.  She  has had some low back pain as well as left ankle pain.  She twisted and  injured her ankle after running into a file cabinet at work.  She uses  Norco for breakthrough pain, which we allowed her take a bit more due to  her multiple problems.   REVIEW OF SYSTEMS:  Notable for the above.  She reports multiple issues  with depression, anxiety, tremor, tingling, dizziness, weakness.  Full  review is in the written health history section and chart and other  pertinent positives are above.   SOCIAL HISTORY:  The patient continues to work full time for [employer].   PHYSICAL EXAMINATION:  VITAL SIGNS:  Blood pressure is 146/80, pulse 90,  respiratory rate 16, and sating 98% on room air.  GENERAL:  The patient is pleasant, alert, and oriented x3.  Affect is  bright and appropriate.  She has good insight and awareness.  EXTREMITIES:  Strength is 5/5 with normal sensory exam today.  HEART:  She has regular heart rhythm.  CHEST:  Clear.  ABDOMEN:  Soft and nontender.  BACK:  Range of motion is fair with some pain still with extension more  than flexion today.  Seems to walk well with no issues with balancing or  transferring.   ASSESSMENT:  1. Chronic lumbar facet syndrome.  2. Depression anxiety.  3. Cervical spondylosis.  4. Bilateral heel spurs.  5. Left leg length discrepancy.  6. Gallstones.   PLAN:  1. We will increase  Norco to 10/325 for breakthrough pain with      intention of decreasing this over the next 2 to 3 months as dental      issues subside.  2. Continue Neurontin, but increased t.i.d. dosing.  3. Xanax p.r.n. for anxiety.  4. I will see her back in 2 months with Nurse Clinic followup next      month.      [name], M.D.  Electronically Signed     ZTS/MedQ  D:  08/14/2008 09:57:22  T:  08/14/2008 22:49:41  Job #:  [id number]

## 2011-02-24 NOTE — Assessment & Plan Note (Signed)
[name] is back regarding her multiple pain issues.  She complains of bit  more pain in her thighs.  She has been suffering from upper respiratory  symptoms for the last 2 weeks and has seen Dr. Everardo All.  She comes in  with a mask over her face today for precaution.  She is still taking her  phentermine, although I want her to wean off this.  She does state that  she has gained further weight regardless.  She is not exercising.  She  has not found a job yet.  She is still anxious in general over her job  and overall well-being.  Sleep has been poor.   REVIEW OF SYSTEMS:  Notable for spasms, dizziness, depression, anxiety,  weight gain, night sweats, fever, and chills.  Other pertinent positives  are above and full review is in the written health and history section  of the chart.   SOCIAL HISTORY:  Unchanged and noted above.   PHYSICAL EXAMINATION:  VITAL SIGNS:  Blood pressure 158/89, pulse 103,  respiratory rate 18.  She is sating 99% on room air.  GENERAL:  The patient is pleasant, alert, and oriented x3.  Affect is  bright.  The weight is unchanged.  EXTREMITIES:  She walks without effort.  She has some pain still with  back extension more than flexion.  Strength is 5/5 with 2/2 sensation.  HEART:  Tachycardiac.  CHEST:  Clear.  ABDOMEN:  Soft, nontender.   ASSESSMENT:  1. Chronic lumbar facet syndrome.  2. Depression with anxiety.  3. Cervical spondylosis.  4. History of calcaneal spurs.  5. History of left knee pain and left ankle sprain.   PLAN:  1. Wean off phentermine.  2. Effexor 75 mg t.i.d.  3. The patient will continue with Norco 10/325 for breakthrough pain      and Xanax for anxiety.  4. Encourage the patient to advocate for herself and work on exercise      career activities etc.  I think [name] is waiting for some miracle      to happen to make everything better, and I feel that she needs to      take charge of her life more and look towards more realistic  goals      activities.  5. I will see her back in 3 months' time.      [name], M.D.  Electronically Signed     ZTS/MedQ  D:  04/02/2009 10:03:53  T:  04/02/2009 23:12:37  Job #:  [id number]

## 2011-02-24 NOTE — Assessment & Plan Note (Signed)
[name] is back regarding her low back pain.  She received some orthotics  through her podiatrist recently for her foot pain and she does not know  if she likes these.  They were custom-made.  She still rates her pain at  a 9-10/10.  Her back may be bothering her more, if anything, with these.  She also was given some ankle braces to wear at night, but she describes  her pain as sharp, burning, stabbing, tingling, aching, constant.  Pain  interferes with her general activity, relations with others, enjoyment  of life on a moderate level.  Patient continues to work full-time.  She  feels very, very stressed by her job and thinks that is affecting her,  as well.  Her exercise is minimal and this has been exacerbated by the  fact the podiatrist recommended she not walk until she gets used to  her orthotics.   REVIEW OF SYSTEMS:  Notable for anxiety, depression, confusion, trouble  walking, tremors, tingling, sugars up and down, occasional skin rash.  Full review is in written health history section.   SOCIAL HISTORY:  Patient is married, living with her husband.   PHYSICAL EXAM:  Blood pressure is 146/72, pulse is 110, respiratory rate  18, she is satting 100% on room air.  Patient is pleasant, alert and oriented times three.  Affect is bright  and appropriate.  Gait is stable.  Arches are fairly preserved.  I examined her orthoses today, which  provide extra arch support, but are very firm with little cushioning.  Calcaneal regions remain tender to deep pressure and palpation.  Low  back is notable for ongoing tenderness in the lumbar spine with limited  extension, more so than flexion.  Weight is stable and she remains a bit  overweight.  HEART:  Regular.  CHEST:  Clear.  ABDOMEN:  Soft, nontender.   ASSESSMENT:  1. Chronic lumbar facet arthropathy.  2. Depression/anxiety.  3. Cervical spondylosis.  4. History of leg length discrepancy.  5. Bilateral heel spurs.  6. History of  weight-gain.   PLAN:  1. Continue Vicodin for break-through pain.  2. We really discussed at length the appropriate exercise for both      physical health, weight-loss and stress management.  I think she      would do very well if she committed herself to such a program.  3. Followup with Dr. Dagoberto Ligas regarding endocrine issues.  4. I am not sure that ankle bracing will promote any type of      improvement in her legs or heels.  I think her orthotic may, in      fact, be exacerbating heel pain.  She might do better just with an      off-the-shelf cushioned arch support with heel pad.  5. I will see her back in four months' time.  She will call me with      any questions or problems.      [name], M.D.  Electronically Signed     ZTS/MedQ  D:  07/22/2007 10:25:02  T:  07/22/2007 15:14:38  Job #:  [id number]

## 2011-02-24 NOTE — Assessment & Plan Note (Signed)
HISTORY OF PRESENT ILLNESS:  [name] is back regarding her chronic back  pain. She complains of some more generalized pain throughout neck, arms,  legs, etc.  Seems to be worse  in the cold and rainy weather that we are  having lately.  She uses Vicodin for break through pain.  She has taken  out her heel/shoe inserts given to her by podiatry and using just  regular shoes with good insole cushioning and seems to be doing better  with her feet.  She rates her pain a 9/10.  She describes it as a sharp  burning and dull, stabbing, aching.  The pain interferes with her  general activity, relations with others, enjoyment of life on a moderate  level.  Sleep is fair, sometimes poor.  She has had some more anxiety  over the holidays as her mother has been sick.  Generally she feels that  her Effexor works well for her along those lines.   REVIEW OF SYSTEMS:  Noted above.  Full review of systems in the health  and history section of the chart.   SOCIAL HISTORY:  Has not changed.   PHYSICAL EXAMINATION:  VITAL SIGNS:  Blood pressure 131/71.  Pulse 107.  Respiratory rate 18. She is sating 98 percent on room air.  GENERAL:  The patient is pleasant.  Alert and oriented times 3.  Affect is bright  and generally appropriate.  The patient is pleasant.  She has fairly  good lumbar flexion.  Again nearly able to touch her toes.  She states  that the flexion relieves her pain.  She has positive extension testing  and facet  maneuvers today.  Strength is generally 5/5. Weight is  generally stable.  HEART:  Regular.  CHEST:  Clear.  ABDOMEN:  Soft and nontender.   ASSESSMENT:  1. Chronic lumbar facet arthropathy.  2. Depression/anxiety.  3. Cervical spondylosis.  4. History of left leg discrepancy.  5. Bilateral  heel spurs.   PLAN:  1. Continue Vicodin for break through pain.  2. Will add Neurontin for some of her generalized pain symptoms.  I am      wondering if she is beginning to centralize  her pain a bit.  3. Start her on 100 mg h.s.  Increased to 300 mg after 2 weeks.  4. Continue appropriate heel/shoe wear.  5. Encouraged appropriate exercises and activity.  We discussed those      today.  She is considering seeing a dietician which I would be in      support of.  6. Maintain Effexor 225 mg XR daily.  7. I will see her back in 3 months.      [name], M.D.  Electronically Signed     ZTS/MedQ  D:  11/09/2007 11:37:30  T:  11/09/2007 15:03:17  Job #:  [id number]

## 2011-02-24 NOTE — Assessment & Plan Note (Signed)
HISTORY:  [name] is back regarding her low back.  She is set for  gallbladder surgery on April 25, 2008.  She may need further surgery for  an adhesion in the bowels as well.  She is rather anxious about the  surgery.  She feels that her back pain is got worse due to anxiety and  some of her pain may have been some referral from the gallbladder  itself.  She is working on better diet, however, as well as weight.  Rates her pain at 9/10.  Sleep is fair.  Pain worsens with walking,  bending, standing, and general activities.  She remains on Vicodin for  breakthrough pain.  She is also on Neurontin 300 mg at bedtime.   REVIEW OF SYSTEMS:  Notable for multiple issues including decreased  taste, depression, anxiety, some confusion, weight loss, fever, chills,  abdominal pain, and coughing.  Full review is in the written health and  history section and other pertinent positives are above.   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   PHYSICAL EXAMINATION:  GENERAL:  The patient is pleasant, alert, and  oriented x3.  Affect is bright and appropriate.  Gait stable.  She continues to have some pain more so with extension and  facet maneuvers.  She appears to have lost some weight.  Strength is  5/5.  Sensory exam is normal.  HEART:  Regular.  CHEST:  Clear.  ABDOMEN:  Soft and nontender.   ASSESSMENT:  1. Chronic lumbar facet syndrome.  2. Depression and anxiety.  3. Cervical spondylosis.  4. Bilateral heel spurs.  5. Left leg length discrepancy.  6. Gallstones.   PLAN:  1. Surgery this Wednesday.  I will follow up with her in the hospital      as needed.  I stated that it would be fine if she needed stronger      pain medicines initially after surgery.  2. Refilled the prescriptions for Vicodin, Xanax, Neurontin, and      phentermine today.  3. I will see her back in about 3 months.      [name],  M.D.  Electronically Signed     ZTS/MedQ  D:  04/23/2008 10:34:07  T:  04/24/2008 06:57:37  Job #:  [id number]

## 2011-02-24 NOTE — Assessment & Plan Note (Signed)
[name] is back regarding her multiple pain complaints.  She actually has  been doing really well with her pain over the last couple of months.  She was laid off work as she is expected.  However, she has been able to  exercise more and did work on her weight.  She has question about her  Effexor whether she can switch to another cheaper alternative.  Her pain  is 8/10 today.  She had her dental procedure done, which peaked her pain  for a while.  Pain interferes with general activity, relations with  others, and enjoyment of life on a moderate-to-severe level.  Sleep is  fair.  She still has some pain in the left low back, buttock area, as  well as the knees and ankle.  Her left knee pain in fact has been better  since the lay off.   REVIEW OF SYSTEMS:  Notable for spasms, depression, and anxiety.  Other  pertinent positives are as above.  Full review is in the written health  and history section of the chart.   SOCIAL HISTORY:  As noted above.   PHYSICAL EXAMINATION:  VITAL SIGNS:  Blood pressure 165/89, pulse is 99,  and respiratory rate is 18.  She is sating 100% on room air.  GENERAL:  The patient is pleasant, alert, and oriented x3.  Affect is  showing bright and appropriate.  She has lost noticeable weight.  She  has minimal antalgia on the left today.  BACK:  Remains painful at extension maneuvers.  Strength is 5/5, normal  sensation.  HEART:  Regular.  CHEST:  Clear.  ABDOMEN:  Soft, nontender.   ASSESSMENT:  1. Chronic lumbar facet syndrome.  2. Depression with anxiety.  3. Cervical spondylosis.  4. History of heel spurs.  5. Left knee pain with response to injection and exercise.  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  6. History of left ankle sprain.   PLAN:  1. We will wean phentermine off taking daily for a month and stop.  2. We changed Effexor XR to the immediate release form t.i.d. 75 mg.  3. Continue  weight loss and exercise measures.  I think she can use      this opportunity.  She has to really improve her health and fitness      levels.  4. Continue to wean Norco 10/325.  The goal will be to get down to 1-2      per day.  5. She may stop the Neurontin at nighttime as well, depending on how      she does with sleep.  She can continue along this line.  6. I will see her back in 3 months' time.      [name], M.D.  Electronically Signed     ZTS/MedQ  D:  01/08/2009 09:46:04  T:  01/09/2009 16:10:96  Job #:  [id number]

## 2011-02-24 NOTE — Assessment & Plan Note (Signed)
[name] is back regarding her low back pain and multiple pain issues.  She  fell recently due to accommodation of factors including ice in her left  ankle which gives her problems periodically due to the arthritis and  sprain she had there.  Left knee is bothering her now once again as a  result and she seems to be favoring it quite a bit.  She feels that the  knee tends to twist and move a lot when she is in stance and also when  she walks and goes up steps.  She also injured her mid back too, as well  has some chronic pain and spasm there just  below the scapula.  Pain  right now is 8-9/10.  She describes as sharp, burning, stabbing, aching,  constant, and sometimes dull.  Pain interferes with general activity,  relations with others, enjoyment of life on a severe level.  Sleep is  good.  Pain usually worsens with most activities as noted above.  It  improves somewhat with the medications as well as heat and rest.  She is  using Norco 10/325 for breakthrough pain to 4 to 5 times a day.   The patient continues to work 40 hours a week for [employer].  No  changes were noted there.   REVIEW OF SYSTEMS:  Notable for the above as well as depression,  anxiety, decreased taste, and weakness.   PHYSICAL EXAMINATION:  VITAL SIGNS:  Blood pressure is 154/79, pulse is  91, respiratory rate 18, and she is sating 99% on room air.  GENERAL:  The patient is pleasant, alert, and oriented x3.  MUSCULOSKELETAL:  She walks with antalgia favoring the left leg today.  Knee had some instability, particularly in the AP plane.  She also had  pain with meniscal provocation today as well.  Left ankle had some  minimal swelling and mild pain with eversion and inversion ranging  today.  Upper back was somewhat tender to palpation and she had trigger  point along the right lower rhomboids, which reproduced her pain  symptoms today.  There is no bruising or skin breakdown in this area.  She had mild decrease in  scapular range of motion as a result today.  Muscles exam is only 5/5 with some pain inhibition.  NEURO:  Sensory exam is normal.  Cognitively, she is intact.  HEART:  Regular.  CHEST:  Clear.  ABDOMEN:  Soft and nontender.   ASSESSMENT:  1. Chronic lumbar facet syndrome.  2. Depression with anxiety.  3. Cervical spondylosis.  4. History of heel spurs.  5. Left leg length discrepancy.  6. Left knee instability and pain with likely osteoarthritis, meniscal      tears, and potentially ligamentous injury.  7. Chronic left ankle sprain/degenerative joint disease.   PLAN:  1. After informed consent, we injected the right rhomboid trigger      point today with 2 mL of 1% lidocaine.  The patient tolerated well.  2. Additionally, I injected the left knee via medial approach with 3      mL of 1% lidocaine and 40 mg of Kenalog.  The patient tolerated      well.  She was given post-injection instructions today.  3. We will have an MRI performed to the left knee to rule out the soft      tissue/ligament injury.  4. We discussed at length that I would like to decrease the amounts of  hydrocodone that she is using.  I think she can do with less and      particularly so after we addressed her knee pain.  She expressed      agreement with that statement and intends to decrease her use over      the next month or 2 months.  5. We discussed also decreasing her phentermine.  I gave her refills      today, but like her to wean off this over the      next few months.  6. Long-term focus will be on aerobic conditioning, back      strengthening, core muscle stabilization etc.      [name], M.D.  Electronically Signed     ZTS/MedQ  D:  11/13/2008 11:27:52  T:  11/14/2008 01:38:06  Job #:  [id number]

## 2011-02-24 NOTE — Assessment & Plan Note (Signed)
HISTORY:  [name] is back regarding her back pain.  She is still having  some symptoms in her back which really have not changed a great deal.  Her biggest concern is her gallstones which have been found through Dr.  Alfonse Alpers. Gegick.  She states that she was told that she had a belly-  full of gallstones.  An ultrasound was done, although nothing else, as  far as I am aware of, was performed as part of her workup.  She rates  her pain as a 9/10.  She has some epigastric pain and pain radiating  down into the left side, as well as her low back symptoms and other  areas of pain.  The Neurontin seems to have worked fairly well for her  generalized pain and sleep at night and she is doing better there.  She  uses Vicodin for break-through pain and is on Xanax for anxiety.   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   REVIEW OF SYSTEMS:  Notable for some intermittent constipation,  depression, confusion and weight gain.  The other symptoms are as noted  above.   PHYSICAL EXAMINATION:  VITAL SIGNS:  Blood pressure 139/75, pulse 88,  respirations 18, saturation 100% on room air.  NEUROLOGIC:  The patient is pleasant, alert and oriented x3.  Affect is  bright and appropriate.  Gait is stable.  She has fair lumbar flexion,  although still has some pain with extension today.  Her weight is  stable.  Strength is 5/5.  Sensory exam is intact.  HEART:  Regular.  CHEST:  Clear.  ABDOMEN:  Soft, nontender.   ASSESSMENT:  1. Chronic lumbar facet arthropathy.  2. Depression/anxiety.  3. Cervical spondylosis.  4. Left leg length discrepancy.  5. Bilateral heel spurs.  6. Gallstones.   PLAN:  1. Follow up with general surgery as recommended.  She may be having      some referred pain from these areas contributing to some of her      back pain.  2. Continue Neurontin 300 mg q.h.s.  I refilled this, as well as Xanax      and Vicodin today.  3. I encouraged ongoing exercise and stretching for now unless advised      otherwise by surgery.   FOLLOWUP:  I will see her back in about three months' time.      [name], M.D.  Electronically Signed     ZTS/MedQ  D:  02/01/2008 10:43:35  T:  02/01/2008 11:05:07  Job #:  [id number]

## 2011-02-24 NOTE — Assessment & Plan Note (Signed)
[name] is back regarding her low back pain.  She has had a significant  amount of dental work over the last few weeks.  Apparently, she had  several crowns that were done incorrectly and she had abscesses  underneath.  She has seen a new dentist.  She has had a lot of facial  pain and headache as a result.  Her stomach is doing much better after  gallbladder prolapse with surgery.  Back has been certainly stable, but  pain is still on the lower lumbar segments.  She is trying to walk and  exercise more and has lost weight.  She is using Norco for breakthrough  pain.  She is to come off the Neurontin essentially.  She still uses  Xanax for anxiety.   REVIEW OF SYSTEMS:  Notable for easy bleeding, depression, and some  anxiety over multiple familial issues going on.  Spasm and dizziness.  Full review is in the written health and history section of the chart.   SOCIAL HISTORY:  The patient is working 40 hours a week still with [employer].   PHYSICAL EXAMINATION:  VITAL SIGNS:  Blood pressure is 161/94, pulse 92,  respiratory rate 18, and she is sating 98% on room air.  Weight is 146  pounds.  GENERAL:  The patient is pleasant, alert, and oriented x3.  There are  multiple dental issues apparent on visual exam today.  Strength is 5/5.  Sensory exam is normal.  BACK:  Somewhat tender with extension today.  It seems to be at baseline  per palpation and posture.  HEART:  Regular.  CHEST:  Clear.  ABDOMEN:  Soft and nontender.  NEUROLOGIC:  Cognitively, she is intact.  Mood is good.   ASSESSMENT:  1. Chronic lumbar facet syndrome.  2. Depression and anxiety.  3. Cervical spondylosis.  4. Bilateral heel spurs.  5. Left leg length discrepancy.  6. Gallstones.   PLAN:  1. Continue with Norco for breakthrough pain for now.  2. I will add Neurontin back into her regimen and go up to b.i.d.      dosing to see if we can help some of her dental/nerve pain in the      mouth and with her  headaches.  It may help her relax as well.  3. I am encouraged about her weight loss and exercise.  She should      continue to use it as a augmenting mechanism for her stress as      well.  4. Xanax for anxiety still, 0.25 q.8 h. p.r.n.  5. I will see her back in about 3 months' time for followup.      [name], M.D.  Electronically Signed     ZTS/MedQ  D:  07/16/2008 11:27:12  T:  07/17/2008 00:14:22  Job #:  [id number]

## 2011-02-24 NOTE — Op Note (Signed)
NAMESHIZA, THELEN               ACCOUNT NO.:  [id number]   MEDICAL RECORD NO.:  [id number]          PATIENT TYPE:  AMB   LOCATION:  DAY                          FACILITY:  [facility]   PHYSICIAN:  Adolph Pollack, M.D.DATE OF BIRTH:  1956-01-26   DATE OF PROCEDURE:  04/25/2008  DATE OF DISCHARGE:                               OPERATIVE REPORT   PREOPERATIVE DIAGNOSIS:  Symptomatic cholelithiasis.   POSTOPERATIVE DIAGNOSIS:  Symptomatic cholelithiasis.   PROCEDURE:  Laparoscopic cholecystectomy, intraoperative cholangiogram.   SURGEON:  Adolph Pollack, M.D.   ASSISTANT:  Leonie Man, MD   ANESTHESIA:  General.   INDICATIONS:  Ms. Eckmann is a 55 year old female, having some migratory  abdominal pain, some upper and some lower abdominal, as well as severe  constipation.  She has known gallstones on ultrasound.  We corrected the  constipation and then she had persistent upper abdominal pain consistent  with biliary colic.  She now presents for elective cholecystectomy.  Procedure risks have been discussed with her preoperatively.   TECHNIQUE:  She is seen in the holding area and then brought to the  operating room, placed supine on the operating table and general  anesthetic was administered.  Her abdominal wall was sterilely prepped  and draped.  Marcaine was infiltrated in the subumbilical region.  A  small incision was made in the subumbilical region through skin,  subcutaneous tissue and fascial layers and peritoneum, entering the  peritoneal cavity under direct vision.  A pursestring suture of 0 Vicryl  was placed around the fascial edges.  A Hassan trocar was introduced  into the peritoneal cavity and pneumoperitoneum created by insufflation  of CO2 gas.   Next laparoscope was introduced.  I placed her in the reverse  Trendelenburg position, right side tilted slightly up.  An 11 mm trocar  was placed through an epigastric incision and two 5 mm trocars placed in  the right mid lateral abdomen.  The fundus of the gallbladder was then  grasped.  No no acute inflammatory changes were noted.  The fundus was  retracted toward the right shoulder.  There are thin adhesions between  the duodenum and the infundibulum of the gallbladder and these were  divided, allowing the duodenum to drop away.  The infundibulum was then  grasped, retracted laterally and then mobilized with blunt dissection on  the gallbladder.  I then identified the cystic duct and an anterior  branch of the cystic artery directly anterior to the cystic duct.  I  isolated the anterior branch of the cystic artery and then created a  window around it.  It was clipped and divided.  I then created a window  around the cystic duct.  I placed a clip at the cystic duct- gallbladder  junction and made an incision in the cystic duct and got some sludge and  bile back.  I then introduced a cholangiocatheter into the abdominal  cavity and placed into the cystic duct and performed a cholangiogram.   Under real time fluoroscopy, dilute contrast was injected into the  cystic duct which was moderate length.  The contrast drained promptly  into the duodenum.  The common hepatic, right left hepatic, common bile  ducts all filled.  There was little irregularity, but I did not notice  any notice any obvious obstructing lesion.  Final report is pending the  radiologist's interpretation.   Following this, the cholangiocatheter was removed, the cystic duct was  clipped 3 times on the biliary side and then divided.  I then used blunt  dissection to identify a posterior branch of the cystic artery and this  was clipped and divided.  The gallbladder was then dissected free from  liver using electrocautery and placed in an Endopouch bag.   The gallbladder fossa was then copiously irrigated.  There is no  bleeding and no bile leak.  I then evacuated fluid as much as possible.  The gallbladder was removed through  the subumbilical port in the  Endopouch bag.  The subumbilical fascial defect was closed under  laparoscopic vision by tightening up and tying down the pursestring  suture.  The remaining trocars were removed and the pneumoperitoneum  released.   The skin was then closed with 4-0 Monocryl subcuticular stitches,  followed by Steri-Strips and sterile dressings.  She tolerated the  procedure well without any apparent complications and was taken to  recovery in satisfactory condition.      Adolph Pollack, M.D.  Electronically Signed     TJR/MEDQ  D:  04/25/2008  T:  04/25/2008  Job:  [id number]   cc:   Alfonse Alpers. Dagoberto Ligas, M.D.  Fax: [phone]   Sean A. Everardo All, MD  [address]  [place]  [place] [address]

## 2011-02-24 NOTE — Assessment & Plan Note (Signed)
[name] Evans is back regarding her low back pain.  She had a fall off her deck  while carrying some chairs and twisted her left ankle.  She has had some  residual swelling, but generally this is improving.  Her pain is at  8/10.  She does report some bilateral heel spur pain.  She has been  trying cushioning and ice, and this is helping somewhat; but she states  that she may need another injection sometime soon again.  She describes  pain and aching, tingling, constant stabbing and burning.  Her pain  rating is an 8/10 on average.  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  She has been more limited in her activity as of  late.  She is seeing Dr. Dagoberto Evans for weight gain and high cortisol level,  and he still has not found any obvious reason for her weight gain.   REVIEW OF SYSTEMS:  Patient reports numbness, tingling, trouble walking,  spasms, confusion, depression, anxiety, loss of taste and smell,  constipation, occasional poor appetite, limb swelling, shortness of  breath.   SOCIAL HISTORY:  Without significant change.   PHYSICAL EXAMINATION:  VITAL SIGNS:  Blood pressure 134/63, pulse 98,  respiratory rate 17.  She is saturating 97% on room air.  GENERAL:  Patient is pleasant, alert and oriented x3.  Affect is bright  and appropriate.  Gait is generally stable with some antalgia at heel  strike bilaterally.  Weight remains generally stable.  MUSCULOSKELETAL:  She has some pain along the rhomboids, particularly on  the right side at the T3-T4 levels.  Shoulder pain is worse with flexion  at the shoulder and abduction of the shoulder.  HEART:  Regular.  CHEST:  Clear.  ABDOMEN:  Soft, nontender.   ASSESSMENT:  1. Chronic lumbar facet arthropathy.  2. Depression/anxiety.  3. Cervical spondylosis with myofascial pain.  4. History of leg length discrepancy.  5. Bilateral heel spurs.  6. Weight gain in hypercortisol state.   PLAN:  1. Continue with current  medications including Vicodin for      breakthrough pain.  2. Encouraged appropriate heel cushioning and icing.  Consider      followup injections in the near future if pain persists.  3. Continue followup with Dr. Dagoberto Evans.  I will send the patient for a      dietary consult to look at better foods and strategies for weight      loss.  4. The patient needs to continue focusing on exercise as possible to      help increase her metabolism.  5. I will see her back in 4 months' time.      [name] Evans, M.D.  Electronically Signed     ZTS/MedQ  D:  03/28/2007 12:41:14  T:  03/28/2007 14:03:53  Job #:  [id number]
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
## 2011-02-27 NOTE — Op Note (Signed)
[name], [name]                           ACCOUNT NO.:  [id number]   MEDICAL RECORD NO.:  [id number]                   PATIENT TYPE:  REC   LOCATION:  [facility]PC                                  FACILITY:  [facility]   PHYSICIAN:  Celene Kras, MD                     DATE OF BIRTH:  1956-10-03   DATE OF PROCEDURE:  DATE OF DISCHARGE:                                 OPERATIVE REPORT   The patient comes in to Pain Management today to be evaluated.  Performed  two point review of systems.   1. To date, the patient has had an equivocal response to interventional     procedures.  She is still having difficulty with gardening, most of her     activities of daily living and her pain level remains fairly high.  I     really do not want to escalate her narcotic based pain medication, and I     do think a minimally invasive interventional approach is warranted.  2. I will go ahead and proceed with a lumbar epidural today as the pain is     more in a global fashion.  In the interim she has been seen by Dr.     Thersa Salt, who has contemplating coccygeal pain, which may be present, and     at some point we may need to entertain further imaging.  She has had not     imaging in the lumbar spine in quite some time, and may need to reassess.     She does not have any particular angulation to the coccyx, and if in fact     this is a pain with comorbidity, probably would be easily amenable to     injection, or else pharmacologic management.  3. Continue on Flexeril.  4. Life style enhancements discussed such as home based therapy.   Objectively, diffuse paralumbar myofascial discomfort and periflexion  extension lateral rotational pain.  Pain over PSIS and pain on extension.  Gains using patch is equivocal.  Coccygeal pain is not classically  reproduced.   IMPRESSION:  1. Degenerative spinal disease, lumbar spine.  2. Facet syndrome.  3. Possible coccygeal component.   PLAN:  1. From a broad  perspective it is reasonable to go ahead and proceed with a     lumbar epidural and see how she does.  I am not going to necessary     proceed with another injection after this but I would like to see how she     does.  She will make an assessment whether this has helped her, and     consider another injection based on need.  She will also determine if the     facet component was more problematic.  She may be amenable to RF.  She     does have a facetal overlay in the lower  segments.  2. Ask her to see her primary care physician regarding home based therapy.  3. Consider muscle stimulation.  4. Further lifestyle enhancements reviewed.   She has consented for today's procedure.   DESCRIPTION OF PROCEDURE:  The patient was taken to the fluoroscopy suite  and placed in prone position.  Back was prepped and draped in the usual  fashion.  Using a Hustead needle advanced to the L5-S1 interspace.  No  evidence of CSF, heme, or paresthesia.  Confirmed placement in multiple  positions.  Test block uneventfully followed by 40 mg of Aristocort and  flushed the needle.   Tolerated the procedure well.  No complications during the procedure.  Appropriate recovery.  Renew Hydrocodone with full disclosure.  Discharge  instructions given.                                                Celene Kras, MD    HH/MEDQ  D:  03/20/2003  T:  03/20/2003  Job:  [id number]

## 2011-02-27 NOTE — H&P (Signed)
[name], [name]               ACCOUNT NO.:  [id number]   MEDICAL RECORD NO.:  [id number]          PATIENT TYPE:  INP   LOCATION:  5707                         FACILITY:  [facility]   PHYSICIAN:  Sean A. Everardo All, M.D. [facility] OF BIRTH:  12/08/1955   DATE OF ADMISSION:  02/22/2006  DATE OF DISCHARGE:                                HISTORY & PHYSICAL   REASON FOR ADMISSION:  Intractable back pain.   HISTORY OF PRESENT ILLNESS:  A 55 year old woman with acute onset one day of  severe left lumbar paravertebral pain.  She was unable to find any  precipitating factors.  She has associated severe constipation and abdominal  cramps.   PAST MEDICAL HISTORY:  1.  Hypertension.  2.  GERD.  3.  Chronic low back pain.  4.  Anxiety/depression.  5.  HSV-2.   SOCIAL HISTORY:  She works in an office situation.  Her husband and multiple  family members are here.   REVIEW OF SYSTEMS:  She denies nausea, vomiting, fever and numbness.   PHYSICAL EXAMINATION:  VITAL SIGNS:  Blood pressure 135/55, heart rate 72,  respirations 20, temperature 97.2.  GENERAL APPEARANCE:  Tearful due to pain.  Pharynx:  Mucous membranes are  dry.  CHEST:  Clear to auscultation.  CARDIOVASCULAR:  No JVD.  No edema.  Regular rate and rhythm.  No murmur.  ABDOMEN:  Soft, nontender, no hepatosplenomegaly.  No masses.  BACK:  Slightly tender at the left lower back of the paravertebral area.  EXTREMITIES:  No deformities seen.  NEUROLOGICAL:  Alert and oriented.  Cranial nerves appear to be intact.  Sensation intact to touching the feet.  She readily moves all four.   LABORATORY DATA:  Hemoglobin 11.2.   IMPRESSION:  1.  Acute intractable exacerbation of low back pain.  2.  Hypertension.  3.  Chronic anxiety/depression which may be contributing to her symptoms.  4.  Constipation with her medication.   PLAN:  1.  Symptomatic therapy.  2.  Intravenous fluids.  3.  When symptoms are full then evaluation for  underlying cause of symptoms      can be considered if appropriate then.           ______________________________  Cleophas Dunker. Everardo All, M.D. [facility]     SAE/MEDQ  D:  02/22/2006  T:  02/22/2006  Job:  [id number]

## 2011-02-27 NOTE — Consult Note (Signed)
[facility]  Patient:    LENNIS, RADER [facility]                       MRN: [id number] Proc. Date: 03/11/00 Adm. Date:  [id number] Attending:  Thyra Breed CC:         Almedia Balls. Randell Patient, M.D.                          Consultation Report  FOLLOW-UP EVALUATION  HISTORY OF PRESENT ILLNESS:  March comes in for follow-up evaluation of her low back pain. Since her last visit, she is having increasing interscapular pain and lower back discomfort. She has been helping her sister move. She is stressed out about the sell of her home. She continues on the hydrocodone, Xanax, and Flexeril at night. She is taking phentermine and Synthroid from Dr. Randell Patient.  PHYSICAL EXAMINATION:  VITAL SIGNS:  Blood pressure 136/54, heart rate 99, respiratory rate 16, O2 saturations 100%, pain level 7/10 and temperature is 97.2.  She exhibited tenderness over the right rhomboids and interscapular regions. The lower extremities demonstrated deep tendon reflexes symmetric with negative straight leg raise signs. She cannot hyperextend her back without discomfort. Forward flexion reduces her discomfort.  IMPRESSION: 1. Low back pain which is a combination of degenerative disk disease and facet    joint syndrome with myofascial pain. 2. Situational stress and anxiety.  DISPOSITION: 1. Continue on hydrocodone 5/500 1 p.o. q. 6h p.r.n. #120 with no refill. 2. Xanax as previously. 3. Flexeril 10 mg 1 p.o. q.p.m. #30 with 2 refills. 4. Followup with me in 4-8 weeks. DD:  03/11/00 TD:  03/11/00 Job: [id number]

## 2011-02-27 NOTE — Consult Note (Signed)
NAMELIVANA, YERIAN                           ACCOUNT NO.:  [id number]   MEDICAL RECORD NO.:  [id number]                   PATIENT TYPE:  REC   LOCATION:  TPC                                  FACILITY:  [facility]   PHYSICIAN:  Zachary George, DO                      DATE OF BIRTH:  [birth year]   DATE OF CONSULTATION:  DATE OF DISCHARGE:                                   CONSULTATION   [facility] NOTE:  Ms. Posas returns to  clinic today for reevaluation.  She was last seen on 09/06/2002.  Currently  she complains of right parascapular pain which started back in January of  2004.  Her job entails a significant amount of typing and using her right  upper extremity repetitively.  She also notes that she occasionally gets  pain radiating into her right upper extremity.  She states that if she puts  her fingers on some of her parascapular muscles and pushes down, that the  symptoms tend to resolve.  She also continues to complain of lower back pain  which has been chronic.  She denies lower extremity radicular symptoms.  She  has been taking hydrocodone 5 mg two to three times per day, which has been  helpful.  We had discussed possibly performing facet joint injections in her  lumbar spine diagnostically and therapeutically.  There is some concern of a  previous allergic reaction to steroids from trigger point injections.  She  is not currently taking any anti-inflammatory medications and states that  previous trials of nonsteroidal anti-inflammatory medications have caused  her GI upset.  Her pain today is a 7 to 9/10 on a subjective scale.  I  reviewed health and history form and 14-point review of systems.   PHYSICAL EXAMINATION:  GENERAL:  Healthy-appearing female in no acute  distress.  VITAL SIGNS:  Blood pressure 135/80, pulse 109, respirations 20, O2  saturation 100% on room air.  BACK:  Examination of the back reveals level pelvis without scoliosis.  There is tenderness to palpation, bilateral lower lumbar paraspinal muscles.  Low back pain is worsened with extension and extension post-rotation  modestly.  There are also trigger points noted in the right levator scapulae  and rhomboid muscles.  NEUROLOGICAL: Neurologically intact in the upper and lower extremities  including motor, sensory, and reflexes.   IMPRESSION:  1. Myofascial pain syndrome with above-noted trigger points.  2. Chronic low back pain.  The patient has lumbar facet arthropathy as well     as degenerative disk changes.  Based on her physical exam findings, I     suspect that the majority of her low back symptoms are secondary to     lumbar facet arthropathy.  3. Non-restorative sleep disorder, improved with the addition of Xanax.   PLAN:  1. Discussed further treatment options with Ms.  [place].  In regards to her     myofascial pain, I would recommend a trial of trigger point injections     with local anesthetic only.  2. Would consider lumbar facet injections diagnostically and     therapeutically;  however, the patient has described a mild allergic     reaction to previous steroids injections.  I would consider this with the     addition of Benadryl.  An alternative to this would be to proceed with     medial branch blocks diagnostically followed by radiofrequency     neuroablation if symptoms are improved.  In that scenario, would     recommend an aggressive physical therapy program for lumbar stabilization     exercises for at least four to six weeks prior to radiofrequency     neurotomy.  Another alternative would be to proceed more conservatively     with a retrial of a COX-2 nonsteroidal anti-inflammatory medication.  I     discussed this with Ms. [place] and we will proceed with Bextra 10 mg     daily over the next two weeks and monitor for affect.  3. The patient to continue with hydrocodone just as needed for pain not     controlled with the above  measures.  4. The patient is to return to clinic in two weeks for reevaluation and     consideration for repeat trigger point injections as predicated upon     patient's response and symptoms.   DESCRIPTION OF PROCEDURE:  Trigger point injections, right levator scapula  and rhomboid muscles.  The patient procedure was described to the patient in  detail including the risks, benefits, and the patient's alternatives and  potential side effects.  Risks include but are not limited to bleeding,  infection, nerve injury, pneumothorax, allergic reaction to medication.  The  patient wishes to proceed.  Informed consent was obtained.  The skin was  prepped in the usual fashion with alcohol swabs.  Each trigger point was  injected with 0.5 mL of 1% lidocaine plus 0.5 mL of 0.25% bupivacaine using  a 25-gauge 1-1/2 inch needle.  There were no complications.  The patient  tolerated the procedure well.  Discharge instructions were given.  The patient was released in stable  condition.  The patient was educated on the above-findings and recommendations and  understands.  There were no barriers to communication.                                                 Zachary George, DO    JW/MEDQ  D:  12/19/2002  T:  12/19/2002  Job:  [id number]   cc:   Gregary Signs A. Everardo All, M.D. [facility]

## 2011-02-27 NOTE — Assessment & Plan Note (Signed)
HISTORY OF PRESENT ILLNESS:  [name] is here in followup of her facet medial  low back pain and myofascial pain. She has done well with the RF ablations.  She has begun working out with friends and become involved in a weight loss  program. She would like to join [employer] and should be doing so shortly. She  is pleased with her pain control at this point and has minimal pain in the  low back except with extension and prolonged standing. There is also some  pain along the right medial scapular border. Her pain on the average is a 5  to 8 out of 10. She uses 2 to 3 Vicodin a day for breakthrough pain. Is able  to work with this dose of medication, which is at 5/500 currently. The  patient uses Xanax 1/2 to 1 tablet 3 times a day as well as occasional  Flexeril for spasms 10 mg q. 8 hours p.r.n.   REVIEW OF SYSTEMS:  The patient denies any new chest pain, shortness of  breath. She has had some recent dental work, which required a few more pain  pills. She had 2 root canals done a few weeks back. She is sleeping very  well. Her mood has been good. She denies any ear, nose or throat problems.  She denies nausea and vomiting, bladder, or bowel issues. Skin has been  intact.   PHYSICAL EXAMINATION:  VITAL SIGNS:  Blood pressure 117/80, pulse 99,  respiratory rate 20, sating 95% on room air.  NEUROLOGIC:  The patient walks with a normal gait. Affect is appropriate.  She seems to have lost some weight. She flexed and touched her toes with  minimal to no discomfort. She had minimal pain with return to the standing  neutral position. With extension with facet maneuvers, she had some pain to  a certain extent bilaterally. Rotation and lateral bending did not cause  discomfort. Motor and sensory examinations were intact in both upper and  lower extremities. Right trapezius area and intra-scapular regions were  slightly tender to palpation today. Posture was good.   ASSESSMENT:  1. Facet arthropathy of  lumbar spine.  2. Myofascial pain.  3. Mild obesity.   PLAN:  1. I encouraged ongoing physical activity. I think she should start with non-     impact program initially with some low level nautilus weights and then     progress to heavier impact exercises over the next several months, as she     is able to tolerate.  2. She will continue with her Vicodin for breakthrough pain, 1 q. 8 hours     p.r.n. She may continue to use Xanax 0.5 to 1 mg t.i.d. p.r.n. as well,     as well as the Flexeril for breakthrough spasms.  3. I will see the patient back in 3 months time.      [name], M.D.   ZTS/MedQ  D:  03/28/2004 16:28:15  T:  03/29/2004 23:33:01  Job #:  [id number]

## 2011-02-27 NOTE — Consult Note (Signed)
NAMEDOHA, BOLING                           ACCOUNT NO.:  [id number]   MEDICAL RECORD NO.:  [id number]                   PATIENT TYPE:  REC   LOCATION:  TPC                                  FACILITY:  [facility]   PHYSICIAN:  Sondra Come, D.O.                 DATE OF BIRTH:  23-Feb-1956   DATE OF CONSULTATION:  09/06/2002  DATE OF DISCHARGE:                                   CONSULTATION   HISTORY OF PRESENT ILLNESS:  Ms. Urquidi returns to clinic today for  reevaluation. She continues to complain of nonradicular low back pain and  occasional spasms in her lower back. One of her biggest complaints today is  that she cannot get a full night's sleep. She has tried the Lidoderm patches  which give her modest improvement at best. She has degenerative disk disease  of the lumbar spine as well as lumbar facet arthropathy. She denies any  increased pain with Valsalva maneuver. She describes her pain today as  constant, achy, throbbing, sharp, burning, stabbing, with associated  weakness. She continues to take hydrocodone 5 mg/500 mg but has decreased  down to four per day. Her pain today is a 8/10 on a subjective scale. She  denies any bowel or bladder dysfunction. I reviewed the health and history  form and 14-point review of systems.   PHYSICAL EXAMINATION:  Reveals a healthy female in no acute distress. Blood  pressure 142/65, pulse 101, respirations 18, O2 saturation 100% on room.  Examination of the back reveals a slightly lower left hemipelvis. There is  tenderness to palpation in bilateral lumbar paraspinal muscles with mild  spasm in her lumbar paraspinous muscles. There is increased pain on  extension and extension plus rotation. Manual muscle testing is 5/5  bilateral lower extremities. Accessory examination is intact to light touch  bilateral lower extremities. Muscle stretch reflexes are symmetrical  bilateral lower extremities. Straight leg raise negative bilaterally. There  is mildly tight hamstrings and hip flexors today which has improved compared  to last visit.   IMPRESSION:  1. Chronic low back pain. I suspect the majority of patient's symptoms are     secondary to lumbar facet arthropathy; however, cannot conclusively rule     out diskogenic etiology.  2. Nonrestorative sleep disorder.   PLAN:  1. Continue home exercise program.  2. Will begin Pamelor 10 mg one to two p.o. q.h.s. as needed #60 without     refills.  3. Consider lumbar facet injections diagnostically and therapeutically, and     I discussed this with pain at length including the risks, benefits,     limitations, and alternatives.  4. Continue hydrocodone for now with the goal of reducing need for narcotic     based pain medication. This was discussed with Ms. [place] as well.  5. The patient to return to clinic in one to two months for  reevaluation.   The patient was educated about findings and recommendations and understands.  There were no barriers to communication.                                              Sondra Come, D.O.   JJW/MEDQ  D:  09/06/2002  T:  09/07/2002  Job:  [id number]   cc:   Gregary Signs A. Everardo All, M.D. [facility]

## 2011-02-27 NOTE — H&P (Signed)
[facility]  Patient:    [name], [name] [facility]                       MRN: [id number] Adm. Date:  [id number] Attending:  Thyra Breed CC:         Almedia Balls. Randell Patient, M.D.                         History and Physical  FOLLOW-UP EVALUATION:  [name] comes in for follow-up evaluation of her chronic low back on the basis of facet joint arthritis and degenerative disk disease with myofascial pain. Since her previous evaluation, she has had increased pain due to a lot of lifting as she is packing to move again. If she is not doing this, she does fairly well. She has developed some burning dysesthesias in the right shoulder area. She continues to have a lower lumbosacral discomfort. She has no new neurologic symptoms.  She was recently seen by Almedia Balls. Fore, M.D. and placed on Z-pak and Tussionex for sore throat.  PHYSICAL EXAMINATION:  VITAL SIGNS:  Blood pressure 125/58, heart rate is 89, respirations 14, O2 saturations are 100%. Pain level is 8/10. Temperature is 97.2.  Deep tendon reflexes are symmetric with negative straight leg raise signs. She has tenderness in the right rhomboid.  IMPRESSION: 1. Low back pain on the basis of facet joint arthritis and degenerative disk    disease. 2. Right rhomboid trigger point on the basis of myofascial pain, likely coming    from her cervical disk disease. 3. Recent upper respiratory tract infection, currently on Z-pak per Almedia Balls.    Fore, M.D.  DISPOSITION: 1. Continue on Hydrocodone/acetaminophen 5/500 1 p.o. q.6h. p.r.n. #120 with    no refill. 2. Continue with Xanax 1 mg 1/2 to 1 p.o. q.8h. p.r.n. #100 with no refill. 3. Continue Flexeril as needed. 4. Followup with me in four to eight weeks. DD:  05/07/00 TD:  05/10/00 Job: [id number]

## 2011-02-27 NOTE — Assessment & Plan Note (Signed)
HISTORY:  The patient is here regarding her back pain and myofascial pain.  She has done fairly well with the facet injections and the RF ablation.  She  is still having some pain that takes place in the low back with prolonged  walking or standing.  Overall though she feels her pain is improved.  She  states that the pain will range from a 5-7/10.  The pain is in the low back  and in the right shoulder region.  She is trying to walk about one mile a  day.  She said that she has gained some weight recently, and weighs more  than she ever has.   CURRENT MEDICATIONS:  1. She remains on Xanax 1 mg t.i.d.  2. Flexeril 10 mg t.i.d. p.r.n.  3. Vicodin 5/500 mg one t.i.d. p.r.n.   REVIEW OF SYSTEMS:  The patient reports a recent bronchitis.  She has some  problems with anxiety and depression, as well as weight gain.  She denies  any reflux, nausea, vomiting, or bladder issues.  She is sleeping fairly  well.  Her outlook has been fair.  Denies any chest pain or shortness of  breath.   PHYSICAL EXAMINATION:  VITAL SIGNS:  Blood pressure 125/55, pulse 99,  saturation 99% on room air.  NEUROLOGIC:  The patient moves fairly well and walks without an antalgic  gait.  She flexes the lumbar spine to approximately 90 degrees, without  discomfort.  When she came up to the standing position, she had more pain.  There was mild discomfort with passive extension, as well as facet maneuvers  bilaterally.  Rotation and lateral bending were non-pain provoking.  Motor  and sensory examinations of the lower extremities was within normal limits.  She had a mild trigger point noted in the right trapezius, but had fairly  normal range of motion except for lateral bending to the left, which was  slightly restricted and caused discomfort in the right shoulder.  Posture  was fair.   ASSESSMENT:  1. Facet arthropathy of the lumbar spine, L4-L5, L5-S1.  2. Myofascial pain.  3. Mild obesity.   PLAN:  1. We  discussed at length the appropriate exercise regimen and plan.  I     think she would do well with a [facility]-type of setup.  We can provide some     diverse treatment-type strategies, including low-level weightlifting,     aerobic therapy and pool therapy.  2. We also discussed dietary strategies.  3. She will continue with her Vicodin for break-through pain, one q.8h.     p.r.n., and Xanax 0.5 mg to 1 mg t.i.d. p.r.n.  She is to use Flexeril     for break-through spasms.  We did not inject her today, as she feels that     her neck still feels fairly good.  4. I will see her back in approximately three months' time.      [name], M.D.   ZTS/MedQ  D:  12/24/2003 12:05:24  T:  12/24/2003 13:01:05  Job #:  [id number]

## 2011-02-27 NOTE — Discharge Summary (Signed)
[name], [name]               ACCOUNT NO.:  [id number]   MEDICAL RECORD NO.:  [id number]          PATIENT TYPE:  INP   LOCATION:  5707                         FACILITY:  [facility]   PHYSICIAN:  Rene Paci, M.D. LHCDATE OF BIRTH:  [birth year]   DATE OF ADMISSION:  02/21/2006  DATE OF DISCHARGE:  02/23/2006                                 DISCHARGE SUMMARY   DISCHARGE DIAGNOSES:  1.  Intractable back pain.  2.  Constipation exacerbated by hemorrhage and chronic pain medications.   HISTORY OF PRESENT ILLNESS:  The patient is a 55 year old female who  presented with complaints of intractable back pain.  She stated that acute  onset of left lumbar pain started 1 day prior to admission.  She was unable  to find any precipitating factors but did note associated severe  constipation and abdominal cramps.  The patient was admitted for further  evaluation and treatment.   PAST MEDICAL HISTORY:  1.  Hypertension.  2.  GERD.  3.  Chronic low back pain.  4.  Anxiety/depression.  5.  Herpes simplex virus II.   COURSE OF HOSPITALIZATION.:  Problem 1.  INTRACTABLE LOW BACK PAIN:  The  patient complained of severe low back pain.  She was placed back on her home  pain regimen, which included Vicodin and Flexeril.  Back pain is now much  improved per patient.  She will be discharged home on her preadmission pain  regimen and instructed to follow up in the pain clinic.   Problem 2.  CONSTIPATION, EXACERBATED BY HEMORRHOIDS IN THE SETTING OF  CHRONIC NARCOTIC USE:  The patient complained of severe hemorrhoidal pain.  A small external hemorrhoid was noted.  She was given lactulose for severe  constipation.  Initial KUB showed a question of ileus and a question of  impaction; however, the patient continued to tolerate p.o.'s throughout the  hospitalization.  There were no clinical Evans of ileus, and she currently  had a bowel movement the morning of admission and reports feeling greatly  improved.  She is instructed to increase fluid intake and fiber intake.   Of note, the patient was noted to be tearful and depressed at times during  this admission, which likely contributed to her symptoms.  She will be  continued on home dosing of Effexor XR.   MEDICATIONS AT DISCHARGE:  1.  Lisinopril 20 mg p.o. daily.  2.  Acyclovir 800 mg p.o. daily.  3.  Effexor XR 225 mg p.o. daily.  4.  Hydrocodone/APAP 5/500 mg one tablet p.o. q.6h. p.r.n. pain.  5.  Alprazolam 0.5 mg three times daily as before.  6.  Cyclobenzaprine 5 mg three times daily as needed.  7.  Protonix 80 mg p.o. before meals.  8.  NuLev 0.125 mg one tablet as needed every four hours.  9.  Phentermine 30 mg  p.o. daily.  10. Preparation H to be used as needed.  11. Colace 100 mg p.o. b.i.d. as needed.   PERTINENT LABORATORY DATA AT DISCHARGE:  Hemoglobin 12.9, hematocrit 35.8,  creatinine 0.9.  Urine pregnancy test negative,  FOLLOW-UP:  Patient instructed to follow up with Dr. Everardo All in one to two  weeks and to contact Dr. Everardo All should she develop nausea, vomiting or  worsening back pain.  She is also instructed to follow up in the pain clinic  and call for an appointment.      Annette S. Annette Juba, NP      Rene Paci, M.D. [facility]  Electronically Signed    MSO/MEDQ  D:  02/23/2006  T:  02/23/2006  Job:  [id number]   cc:   [name] A. Everardo All, M.D. [facility]  [address]  [place]  [place] [address]

## 2011-02-27 NOTE — Assessment & Plan Note (Signed)
DATE OF VISIT:  December 22, 2004.   MEDICAL RECORD NUMBER:  [id number].   [name] is here regarding her low back and knee pain.  She has done well with  the shoe insert on the left heel.  She has stuck with the off the shelf heel  lift and is comfortable with this.  It is a gel type of insert.  She rates  her pain on average at about a 7-8/10.  She uses Vicodin once or twice daily  for breakthrough symptoms.  She notes that the pain in the back is worsened  with walking, bending, sitting and working.  She has been walking a bit more  for exercise.  She has not been regular with her generalized aerobic  exercise program.  She uses Flexeril p.r.n. for spasms.  Also uses Xanax 0.5  mg one three times a day p.r.n. for anxiety.  Ice has been helpful to the  knee.  She went for a colonoscopy and endoscopy earlier this month.  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  She was placed on  Protonix 40 mg b.i.d.  She is awaiting the results of her colonoscopy.   SOCIAL HISTORY:  Without change.   REVIEW OF SYSTEMS:  The patient reports some swelling, blurred vision,  occasional anxiety, problems with taste, periodic headaches and problems  with sleeping.  She reports nausea, reflux, left abdominal pain and  constipation.   PHYSICAL EXAMINATION:  The blood pressure is 113/44, the pulse is 96 and the  respiratory rate is 16.  She is saturating 99% on room air.  The patient  walks with a fairly normal gait.  She does still tend to lean a bit to the  left side.  Affect is bright and appropriate.  Appearance is well kept.  The  right hemipelvis still remains a bit elevated over the left despite the shoe  insert.  She has good flexion and lateral bending to either side.  Extension  did cause some pain today.  The right knee was minimally tender and swollen  today.  Strength was preserved, as well as sensation in both legs today.   ASSESSMENT:  1.  Facet arthropathy of lumbar  spine.  2.  Right knee pain related to patellar tendonitis, which is improved.  3.  Mild obesity.  4.  Leg length discrepancy of the left side.   PLAN:  1.  Continue off the shelf shoe insert.  2.  Recommend purchasing new tennis shoes with better insole support.  3.  Avoid Celebrex in the near future due to esophageal stenosis and      erosion.  4.  The patient may use Vicodin and Flexeril for breakthrough pain and      spasms.  5.  Consider custom inserts.  6.  I placed the patient on Effexor 75 mg t.i.d. so that the patient will      have better coverage from her insurance.  I      would not switch her to BuSpar at this point.  7.  I will see her back in about four months' time.      ZTS/MedQ  D:  12/22/2004 14:57:42  T:  12/22/2004 17:03:13  Job #:  [id number]   cc:   Gregary Signs A. Everardo All, M.D. [facility]

## 2011-02-27 NOTE — Assessment & Plan Note (Signed)
HISTORY OF PRESENT ILLNESS:  [name] is back regarding her low back and knee  pain. She is doing fairly well from a pain standpoint. The pain has been  well controlled. She rates her pain, on average, at a 5 to 7 out of 10. It  often bothers her more when she walks, bends, and works. Improves with  decrease in her stress and rest. She is continuing to wear her left sided  shoe lift. She is up to walking 4 miles at a time. She has lost 20 pounds  since I last saw her. She has had workup for her gastrointestinal system  with her fathers history of colon cancer. It has been negative so far. She  has had some problems with dental issues. She is using her Xanax 0.5 mg 2 to  3 times a day as needed for anxiety with good results. She uses Vicodin once  or twice a day for breakthrough pain. She has used a little bit more  recently due to her dental problems. She uses Flexeril for spasms as needed.  The patient states that her pain is in the left low back region as well as  in the right scapular area at time. There is no radiation into the legs.   SOCIAL HISTORY:  The patient is without change.   REVIEW OF SYSTEMS:  The patient denies any constitutional, gastrointestinal,  genitourinary, neurologic, or psychiatric problems. She has had some mild  stress from a niece, who is going through some issues of her own.   PHYSICAL EXAMINATION:  VITAL SIGNS:  Blood pressure 143/81, pulse 92,  respiratory rate 16. She is sating 95% on room air.  NEUROLOGIC:  The patient is pleasant and in no acute distress. She is alert  and oriented X3. She walks with a normal gait. Affect is bright and  appropriate. Appearance is well kept. Low back range of motion  is stable.  She has good flexion and bending forward and to the side today. Strength is  generally controlled. Right knee is without tenderness.   ASSESSMENT:  1.  Facet arthropathy of the lumbar spine.  2.  Right knee pain associated with patellar tendinitis,  which has improved.  3.  Mild obesity.  4.  Leg length discrepancy on the left.   PLAN:  1.  Continue with shoe insert.  2.  Continue aerobic exercise.  3.  Use Celebrex only as needed for breakthrough pain.  4.  Continue with Vicodin and Flexeril p.r.n. as well.  5.  Effexor 75 mg t.i.d. has been successful for mood control general level.  6.  She uses Xanax 0.5 mg p.r.n.  7.  Will see the patient back in about 6 months time.  8.  She should continue to focus on weight loss and exercise.       ZTS/MedQ  D:  04/17/2005 12:25:16  T:  04/17/2005 13:55:50  Job #:  [id number]   cc:   Gregary Signs A. Everardo All, M.D. [facility]

## 2011-02-27 NOTE — Assessment & Plan Note (Signed)
[name] is back regarding her low back and neck pain.  The low back has been  generally stable.  I injected her right trapezius and rhomboids the last  visit with lidocaine, and she had excellent results with resolution of a lot  of that pain.  She is really happy with where she is at.  She does note  occasional burning pain in the neck when she works multiple hours.  She  tries to do some stretching and cervical distraction, and this seems to give  her some relief.  Norco 7.5 mg seems to help a good bit with her  breakthrough pain as well.  The patient describes the pain as burning, dull,  constant, tingling and aching.  The patient states that sleep is fair.  Pain  is usually worse with walking, bending and prolonged activities.  She  continues to work full-time.  She was given a list of supplements by her  OB/GYN to help with her energy and hormonal issues.  She had questions about  the safety of taking these with her current medications that we prescribe.  The patient does report that Dr. Everardo All noted blood in her urine on  urinalysis, and she is in the middle of a further workup for this.   REVIEW OF SYSTEMS:  The patient reports some depression, anxiety, spasm,  tingling, night sweats, recent bronchitis.  Otherwise she is feeling very  well.   SOCIAL HISTORY:  The patient is working as above and remains married.   MEDICATIONS:  1.  Norco 7.5 mg one q.6h. p.r.n., which she usually uses twice a day.  2.  She uses Flexeril as well p.r.n. t.i.d.  3.  Xanax 0.5 mg one t.i.d. p.r.n.   PHYSICAL EXAMINATION:  VITAL SIGNS:  Blood pressure 136/71, pulse 91,  respiratory rate 16, she is saturating 100% on room air.  GENERAL:  The patient is pleasant, in no acute distress.  She is alert and  oriented x3.  Affect is bright and appropriate.  MUSCULOSKELETAL/NEUROLOGIC:  Gait is stable.  Coordination is fair.  Reflexes are 2+.  Sensation is normal.  Strength is 5/5 in all four  extremities.   Sensory exam is intact.  Muscles are much less taut and better  range of motion today.  Posture is improved.  Spurling test was negative.  Mood was appropriate, and cognitively the patient was intact.   ASSESSMENT:  1.  Facet arthropathy of the lumbar spine.  2.  Chronic cervical spondylosis with myofascial pain.  3.  Right knee pain, which is improved.  4.  Leg length discrepancy on the left.   PLAN:  1.  Continue current medications, Norco 7.5/325 mg one q.6h. p.r.n., as well      as Flexeril 10 mg q.8h. p.r.n.  2.  Continue with range of motion, rest breaks and postural control.  3.  Heat and massage as needed.  4.  Can consider further trigger point injections as needed.  5.  We reviewed her supplements that were recommended, and I think these are      all generally safe.  I do question      ability to maintain a regimen of so many supplements, however, in      addition to the cost that this will incur.  6.  I will see the patient back in about three months' time.      [name], M.D.  Electronically Signed     ZTS/MedQ  D:  10/19/2005  14:39:24  T:  10/20/2005 07:21:49  Job #:  [id number]   cc:   Gregary Signs A. Everardo All, M.D. [facility]  [address]  [place]  [place] [address]

## 2011-02-27 NOTE — Assessment & Plan Note (Signed)
MEDICAL RECORD NUMBER:  [id number].   [name] is here in followup of her facet mediated low back pain and myofascial  pain.  She has done well with the RF ablations.  She was doing fairly well  until a few weeks back when she was carrying a suitcase at home, lifted it  up onto the bed and felt some back spasms.  They seemed to subside over a  few days.  She has also been doing some more lifting at work.  The pain  ranges from 8-9/10 at its worse.  She has a occasional tingling sensation in  the right arm and leg, although these are not consistent.  The pain is  mostly in the lower buttock region, as well as in the right neck area.  The  pain is worse with walking, bending, sitting and working and improves with  rest and medications.  She has been trying to walk every day.  She is still  not involved in a regular exercise program, however.   REVIEW OF SYSTEMS:  The patient reports occasional swelling, weakness,  numbness, spasms, blurred vision, anxiety, depression, sleep problems,  agitation, headaches and occasional reflux.  She has lost weight of about 15  pounds by design.  She reports to skin breakdown, sweating, bruising or  bleeding.   PHYSICAL EXAMINATION:  On physical examination today, the blood pressure is  124/70, pulse 105, respiratory rate 16 and she is saturating 99% on room  air.  The patient walks with a fairly normal gait.  Affect is bright and  appropriate.  Appearance is well kept.  I examined her low back today and  there was minimal discomfort with palpation.  Extension seemed to cause most  pain, however, bilaterally, particularly with facet maneuvers.  There was a  discrepancy of her PSIS and iliac crest today of about 1-1.5 cm.  We  measured leg lengths bilaterally and her left leg was approximately 1-1.25  cm shorter than the right from the ISIS to medial malleolus points.  She had  good flexion today, as well as rotation and lateral bending.  The motor exam  was  intact in the bilateral lower extremities.  Upper extremity motor  strength was intact, as well as sensory function.  Spurling's test was  negative.  There was a taut band of muscle in the low mid trapezius on the  right side.  This seemed to reproduce pain when pressed upon.  Posture  overall was fair, although she tended to sit at rest with the right shoulder  depressed and slightly protracted.   ASSESSMENT:  1.  Facet arthropathy of the lumbar spine.  2.  Myofascial pain of the trapezius muscle particularly.  3.  Mild obesity, which is improving.  4.  Leg length discrepancy on the left.   PLAN:  1.  After informed consent today, we injected the right trapezius trigger      point with 2 mL of 1% lidocaine.  The patient tolerated this well.  2.  For leg length discrepancy, I gave her 1/4-inch shoe lift today which      seemed to make her feel balanced and better instantly.  3.  The patient will join a facility for an exercise maintenance program.  4.  The patient will continue to use Vicodin for breakthrough pain, as well      as Flexeril for spasms.  I did recommend an anti-inflammatory for acute      exacerbations of pain as well.  5.  I will see the patient back in about one month's time.  6.  If she does well with the insert, we will order her custom shoe inserts.      [name], M.D.    ZTS/MedQ  D:  06/27/2004 13:31:51  T:  06/29/2004 16:39:11  Job #:  [id number]

## 2011-02-27 NOTE — Op Note (Signed)
[name], [name]                           ACCOUNT NO.:  [id number]   MEDICAL RECORD NO.:  [id number]                   PATIENT TYPE:  REC   LOCATION:                                       FACILITY:   PHYSICIAN:  Celene Kras, MD                     DATE OF BIRTH:  11-16-1955   DATE OF PROCEDURE:  DATE OF DISCHARGE:                                 OPERATIVE REPORT   PROCEDURE:  [name]  evaluate her health and history form, 14 point review of systems.   1. I reviewed the chart and reviewed progress to date, indications for     interventional procedure, and overall directed care approach.  Out plan     today is L5-S1, L4-5 facetal injection on right and left side with L3-4     as contributory innervation.  We will predicate any further injection     based on need and overall response with the understanding that we are     attempting to improve function and quality of indices and restore the     sleep capacity, decrease likelihood escalation of narcotic base pain     medication.  She will assess this block, followup for possible second in     series and then move forward as the patient's response dictates.  She     does have diskogenic pain as part of the mechanical component and reveals     and anterior and posterior compartment pain.  Would consider addressing     the disk at a later date.  She is nonsurgical according to Dr. Newell Evans.     I have asked her to maintain contact with primary care.   1. Lifestyle enhancements include weight control. This is her best outcome     position for the low back.   1. Do not believe further imaging or diagnostics are warranted at this time.     I did not change her medication.   OBJECTIVE:  Diffuse paralumbar myofascial discomfort in __________ flexion,  extension __________ rotational pain with [name]'s and [name]'s  equivocal.  No new neurological findings, motor sensory, or  reflexes.  Pain  with extension.   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   PLAN:  Facet medial branch injection L5-S1, L4-5, L3-4 right and left side.  L3-4 is contributory innervation independent needle access point.  She has  consented.   The patient taken to the fluoroscopy suite and placed in prone position.  Back prepped and draped in usual fashion.  Using a 22-guage spinal needle,  advanced to facet of medial branch, L5-S1, L4-5, L3-4 right and left side  independent needle access points.  Confirmed placement in multiple  fluoroscopic positions.  I then inject 1 mL of lidocaine 1%  MPF at each  level with a total of 40 mg of Aristocort in divided dose.  She tolerated  the procedure well.  No complications from procedure.  Discharge  instructions given.  Appropriate recovery.  Will see her in followup.                                               Celene Kras, MD    HH/MEDQ  D:  03/06/2003  T:  03/06/2003  Job:  [id number]

## 2011-02-27 NOTE — Procedures (Signed)
NAMESEMYA, [name]               ACCOUNT NO.:  [id number]   MEDICAL RECORD NO.:  [id number]           PATIENT TYPE:   LOCATION:                               FACILITY:  [facility]   PHYSICIAN:  Erick Colace, M.D.DATE OF BIRTH:  07-03-1956   DATE OF PROCEDURE:  04/12/2006  DATE OF DISCHARGE:                                 OPERATIVE REPORT   PROCEDURE:  Bilateral L5 dorsal ramus injection, bilateral L4 medial branch  blocks, bilateral L3 medial branch blocks under fluoroscopic guidance.   INDICATIONS:  Lumbar facet mediated pain with long-term relief following  procedure performed in December 2004.  Pain level is 10/10 currently and  this is despite Norco.   Informed consent was obtained after describing risks and benefits of the  procedure with the patient.  These included bleeding, bruising, infection,  loss of bowel or bladder function, temporary or permanent paralysis and she  elected to proceed and has given written consent.  The patient was placed  prone on the fluoroscopy table, Betadine prep and sterile drape.  A 25-  gauge, an inch and a half needle was used to anesthetize the skin and  subcutaneous tissues with 1% lidocaine x1 cc at each site.  Then, a 22-gauge  3-1/2 inch spinal needle was inserted under fluoroscopic guidance first at  the left S1 SAP sacral ala junction. Bone contact made, confirmed with  lateral imaging.  Omnipaque 180 x 0.5 cc demonstrated no intravascular  uptake and 0.5 cc of a solution containing 1 cc of  40 mg Depo-Medrol  and 2  cc of 2% MPF lidocaine.  Then the right S1 SAP sacral ala junction targeted,  bone contact made, confirmed with lateral imaging.  Omnipaque 180 x 0.5 cc  demonstrated no intravascular uptake and 0.5 cc of the Depo-Medrol/lidocaine  solution injection.  Next the right L5 SAP transverse process junction  targeted, bone contact made and confirmed with lateral imaging.  Omnipaque  180 x 0.5 cc demonstrated no intravascular  uptake and 0.5 cc of Depo-  Medrol/lidocaine solution was injected in the right L4 SAP transverse  process junction targeted, bone contact made and confirmed with lateral  imaging.  Omnipaque 180 x 0.5 cc demonstrated no intravascular uptake and  0.5 cc of Depo-Medrol/lidocaine solution was injected in the left L5 SAP  transverse process junction targeted, bone contact made and confirmed with  lateral imaging.  Omnipaque 180 x 0.5 cc demonstrated no intravascular  uptake and 0.5 cc of Depo-Medrol/lidocaine solution was injected in the left  L4 SAP transverse process junction targeted, bone contact made and confirmed  with lateral imaging.  Omnipaque 180 x 0.5 cc demonstrated no intravascular  uptake and 0.5 cc of Depo-Medrol/lidocaine solution was injected.  The  patient tolerated the procedure well.  Post injection instructions given,  post injection pain level 0/10.      Erick Colace, M.D.  Electronically Signed     AEK/MEDQ  D:  04/12/2006 16:04:02  T:  04/12/2006 17:20:24  Job:  [id number]

## 2011-02-27 NOTE — Assessment & Plan Note (Signed)
HISTORY OF PRESENT ILLNESS:  [name] is here regarding her low back pain.  The  back pain has been increased over the last couple of months.  She also tells  me she was admitted to [facility] on Feb 21, 2006 for intestinal  blockage.  I am not privy to the details of this, but apparently she was  severely constipated when she came in, and this resolved with multiple  measures.  The patient had tried sleep enemas at home without success.  She  has been placed on Colace since the discharge, and it really is not helping.  She is having bowel movements still only once every 3-5 days.  The patient  rates her back pain as a 9-10/10.  The left foot is much better since the  injection was performed.  She describes the pain as sharp, burning, dull,  stabbing, constant tingling and aching.  The pain occurs with general  activity and affects her relationships and enjoyment of life on a severe  level.  Sleep is poor.  The pain worsens with anxiety, as well as walking,  bending, sitting, standing.  She does try to walk.  She has tried to improve  her diet in regards to fiber and vegetables.  She has given up sweetened  sodas.  She stated that she can walk 30 minutes at a time.  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   REVIEW OF SYSTEMS:  Positive for bowel problems, as mentioned above,  weakness, tingling, spasms, depression, anxiety, weight gain, skin rash,  urine retention.  Other pertinent problems listed above and in full review  in the health and history section on the chart.   SOCIAL HISTORY:  The patient is married and living with her husband.   PHYSICAL EXAMINATION:  VITAL SIGNS:  Blood pressure is 151/66, pulse is 102,  respiratory rate 16.  She is satting 99% on room air.  GENERAL:  The patient is pleasant, in no acute distress.  She is alert and  oriented x3.  Affect is bright and appropriate.  Gait is stable.  HEART:  Regular rate and rhythm.  LUNGS:  Clear.  ABDOMEN:  Soft, nontender.  BACK:  The back remains tender along the lumbar facets bilaterally, really  starting at L3 and moving down to S1.  Both PSIS areas are tender.  Axial  loading caused some pain, as well as facet maneuvers to the left and right.  The patient has relief of pain with flexion today.  She is able to flex to  approximately 55-75 degrees with relief of pain.  Extension was accomplished  to approximately 15 degrees with increased pain.  NEUROLOGIC:  Motor  function was 5/5.  Sensory exam was normal.  NEUROLOGIC:  Cognitively, she was appropriate.  HEART:  Regular rate and rhythm.  LUNGS:  Clear.  ABDOMEN:  Soft, nontender.   ASSESSMENT:  1.  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  2.  Cervical spondylosis with myofascial pain.  3.  History of left leg length discrepancy.  4.  History of left calcaneal spurs.   PLAN:  1.  Will set up radiofrequency ablation at L3 through S1 bilaterally.  She      last had RF done by Dr. Stevphen Rochester in January of 2005, only on the right      side.  She had 18 months of  relief with these essentially.  However, the      pain slowly is increasing again.  2.  She will stay with Norco 7.5 for breakthrough pain.  3.  We discussed at length a bowel regimen.  I would like her to start out      with Senokot S, 2 at bedtime, increasing to b.i.d. if needed.  She may      add a fiber supplement, as well.  We talked about appropriate diet and      exercise.  Consider more aggressive measures dependent on her status.      She may want to discuss her Zocor with Dr.      Everardo All, as well, as this may lead to constipation.  4.  I will see her back after she finishes with Dr. Wynn Banker.      [name], M.D.  Electronically Signed     ZTS/MedQ  D:  03/05/2006 12:20:50  T:  03/06/2006 11:33:24  Job #:  [id number]   cc:   Gregary Signs A. Everardo All, M.D. [facility]  [address]   [place]  [place] [address]

## 2011-02-27 NOTE — Op Note (Signed)
[name], STROUGH                           ACCOUNT NO.:  [id number]   MEDICAL RECORD NO.:  [id number]                   PATIENT TYPE:  REC   LOCATION:  [facility]                                  FACILITY:  [facility]   PHYSICIAN:  Celene Kras, MD                     DATE OF BIRTH:  08-28-1956   DATE OF PROCEDURE:  05/15/2003  DATE OF DISCHARGE:                                 OPERATIVE REPORT   SUBJECTIVE:  [name] comes in for pain management today and was  evaluated with health and history form of 14 point review of systems.   1. Aryia is making some progress, and seems to be more active. Although she     does have a facetal overlay, again her  presentation today is more global     in  presentation, and so I will go ahead and address her by a     translaminar approach. Should she break through in a month or 2, we may     consider moving toward facetal components, but I just think  from the     response we are getting  and the  presentation today, I would like to     stay the course. If Dr. Wynn Banker follows up with her and feels medial     branch is warranted, this of course is an option down the road, as a     positive provocative block will lead [place] to consideration of     radiofrequency neuroablation, the decreased likelihood of escalation in     narcotic based pain medication, improved  function and decrease     interventional procedures and will allow participation in rehabilitation     potential.  2. She is complaining about a little bit of gain in weight, which may be     multifactorial. I would like her to continue with her activities. It     possibly has some relationship to the steroid, although the dose we are     giving I would probably have difficulty implicating. She is on Flexeril     t.i.d. and  this may be another issue, and she is fairly sedentary. I     have asked her to see her primary care physician in  this regard.  3. Other lifestyle enhancements were  reviewed.   OBJECTIVE:  .  Objectively, diffuse paralumbar fascia is accompanied with impaired flexion,  extension and lateral rotational pain. She has no significant change in  neurological musculoskeletal  presentation. She has apparently less  myofascial pain.   IMPRESSION:  Degenerative spinal disease of lumbar spine, probable facet  syndrome.   PLAN:  Lumbar epidural. She has consented.   DESCRIPTION OF PROCEDURE:  The patient was taken to the fluoroscopy suite  and  placed in the prone position. Her  back was prepped and draped in the  usual sterile fashion. Using the Hustead needle I advanced to the L5-S1  interspace; no evidence of CSF, heme or paresthesia. Passed block  uneventfully followed by 40 mg of Aristocort and flushed the needle.  She tolerated  the procedure well. There were no complications from the  procedure.    DISPOSITION:  Discharge  instructions were given. She will be seen in follow  up. Dr. Wynn Banker will assist in further direction of her care. On review of  her medications which she brings today, she is appropriate.                                               Celene Kras, MD    HH/MEDQ  D:  05/15/2003  T:  05/15/2003  Job:  [id number]   cc:   Erick Colace, M.D.  [address] [place]  [place] [address]  Fax: [phone]

## 2011-02-27 NOTE — Assessment & Plan Note (Signed)
[name] is back regarding her multiple pain issues.  She has been doing  better over the last few months and she has been exercising and losing  weight.  She occasionally has an irritation when she over does it, but  for most part has found the activity very beneficial.  She using her  Norco for breakthrough pain.  She has been able to get back on her  Effexor XR through the [facility] program.  The patient's pain usually is  around 6-7 out of 10.  Pain is sharp, dull, sometimes stabbing, and  aching.  Pain interferes with general activity, relations with others,  enjoyment of life on a moderate-to-severe level.   REVIEW OF SYSTEMS:  Notable for trouble walking, spasms, weakness.  Full  14 point of review is in the written health and history section of the  chart.   Social history is unchanged.  She and her husband are getting along  fairly well.   PHYSICAL EXAM:  VITAL SIGNS:  Blood pressure 157/87, pulse is 93,  respiratory 16.  She is sating 100% on room air.  NEUROLOGIC:  The patient is pleasant, alert, oriented x3.  She still has  more pain with extension and flexion.  Strength is 5/5 with 2/2  sensation and 2+ reflexes.  HEART:  Regular.  CHEST:  Clear.  ABDOMEN:  Soft, nontender.   ASSESSMENT:  1. Chronic low back pain related to lumbar facet arthropathy.  2. Depression with anxiety.  3. Cervical spondylosis.  4. History of heel and knee pain on the left.   PLAN:  1. The patient has Norco 10/325 recently refilled.  2. Discussed multiple dietary and exercise changes to improve her pain      and stamina.  I think, she is on the right track here.  3. Continue Effexor XR 225 mg daily.  4. I will see her back in about 4 months' time.      [name], M.D.  Electronically Signed     ZTS/MedQ  D:  06/25/2009 12:52:33  T:  06/26/2009 04:54:14  Job #:  [id number]

## 2011-02-27 NOTE — Consult Note (Signed)
[name], [name]                           ACCOUNT NO.:  [id number]   MEDICAL RECORD NO.:  [id number]                   PATIENT TYPE:  REC   LOCATION:  [facility]                                  FACILITY:  [facility]   PHYSICIAN:  Zachary George, DO                      DATE OF BIRTH:  [birth year]   DATE OF CONSULTATION:  01/12/2003  DATE OF DISCHARGE:                                   CONSULTATION   HISTORY OF PRESENT ILLNESS:  The patient returns to clinic today for  reevaluation and possible repeat trigger point injections.  She was last  seen on 12/19/2002 at which time she underwent trigger point injections to the  right levator scapulae and rhomboid muscles for myofascial pain syndrome.  She states she had 100% relief of her right upper back pain for  approximately 24 hours, then her pain started to return towards baseline,  but overall she still has about 20% relief of her right upper back pain.  She still complains of non-radicular lower back pain which is stable.  She  continues taking Vicodin 5 mg 2 per day which is down from 6 per day  previously.  She continues her stretching and walking program, and we  discussed lumbar stabilization exercises which I think would be beneficial  for her in the long run.  We have also discussed possibly proceeding with  lumbar medial branch bock diagnostically and leading to radiofrequency  neural ablation if symptoms are improved.  Facet joint injections are really  not an option because the patient has had allergic reaction to  corticosteroids perviously.  Prior to any medial branch blocks, I would  encourage her to participate in a fairly aggressive core stabilization  exercise program, and we discussed this.  She denies any radicular component  in the upper and lower extremities.   I reviewed the health and history form and 14-point Review of Systems.   PHYSICAL EXAMINATION:  VITAL SIGNS:  Blood pressure 137/76, pulse 95,  respirations 18, O2  saturation 100% on room air.  NEUROLOGIC:  Intact in the upper and lower extremities including motor,  sensory, and reflexes.  MUSCULOSKELETAL:  There are trigger points noted in the right levator  scapulae and rhomboid muscles which are improved from previously to some  degree.   IMPRESSION:  1. Myofascial pain syndrome at above-noted trigger points.  2. Chronic low back pain likely secondary to lumbar facet arthropathy,     although the patient does have some degenerative disk changes as well.   PLAN:  1. Repeat trigger point injections to the right levator scapulae and     rhomboid muscles.  2. Continue home exercise program and add core stabilization exercises.     Encourage patient to obtain an exercise ball and begin performing lumbar     stabilization exercises with the ball.  Would consider referral  to     physical therapy for instruction if needed.  3. The patient is to continue Bextra 10 mg daily as needed.  4. The patient is to continue hydrocodone 5 mg just as needed for pain.     This  has been prescribed by primary care physician and is appropriate at     this point.  Would ultimately like to see her be able to come off of the     hydrocodone if possible.  5. The patient is to return to clinic in two to three weeks for repeat     trigger point injections as predicated by patient's response and     symptoms.   PROCEDURE:  Trigger point injections, right levator scapulae and rhomboid  muscles.   DESCRIPTION OF PROCEDURE:  The procedure was described to the patient in  detail including the risks benefits, limitations, alternatives, and  potential side effects. Risks include but are not limited to bleeding,  infection,  nerve injury, pneumothorax, allergic reaction to medications.  The patient understands and wishes to proceed.  Informed consent was  obtained.   Skin was prepped in the usual fashion with alcohol swabs.  Each trigger  point was injected with 0.5 ml of  1% lidocaine plus 0.5 ml of 0.25%  bupivacaine using a 25-gauge 1-1/2 inch needle.  There were no  complications.  The patient tolerated the procedure well.  Discharge  instructions were given.  The patient was released in stable condition.   The patient was educated about findings and recommendations and understands.  No barriers to communication.                                               Zachary George, DO    JW/MEDQ  D:  01/12/2003  T:  01/12/2003  Job:  [id number]   cc:   Gregary Signs A. Everardo All, M.D. [facility]

## 2011-02-27 NOTE — Procedures (Signed)
[name], [name]               ACCOUNT NO.:  [id number]   MEDICAL RECORD NO.:  [id number]          PATIENT TYPE:  REC   LOCATION:  [facility]                          FACILITY:  [facility]   PHYSICIAN:  Erick Colace, M.D.DATE OF BIRTH:  [birth year]   DATE OF PROCEDURE:  07/19/2006  DATE OF DISCHARGE:                                 OPERATIVE REPORT   PROCEDURE:  Left L5 dorsal ramus injection and radiofrequency, left L4  medial branch block and radiofrequency, left L3 medial branch block and  radiofrequency, done under fluoroscopic guidance.   INDICATION:  Lumbar facet mediated pain with good relief following right  lumbar RF. She has residual left sided pain which is only partially  responsive to medication management.   Informed consent was obtained after describing risks and benefits of the  procedure.  These include bleeding, bruising, infection, loss of bowel and  bladder function, temporary or permanent paralysis.  The patient elects to  proceed and has given written consent. The patient is placed prone on the  fluoroscopy table.  Betadine prep, sterile drape. 25-gauge 1 1/2 inch needle  was used to anesthetize the skin and subcu tissue with 1% lidocaine x2 mL at  each three sites.  Then, a 20-gauge 10-cm RF needle with a 10-mm curved  active tip was inserted under fluoroscopic guidance, targeting the junction  of the left S1 SAP and sacral ala.  Bone contact made, confirmed with  lateral imaging.  Motor stimulation x3 volts demonstrated no lower extremity  twitch. This is followed by injection of 1 mL of a solution containing 1 mL  of 40 mg per mL Depo-Medrol and 2 mL of 1% MPF lidocaine.  Then,  radiofrequency performed at 70 degrees x 70 seconds.  Next, the left L5 SAP  transverse process junction targeted, bone contact made confirmed with  lateral imaging. Motor stimulation x3 volts demonstrated no lower extremity  twitch.  This was followed by injection of 1 mL of the  Depo-Medrol lidocaine  solution followed by RF lesioning at 70 degrees x 70 seconds.  Then, the  left L4 SAP transverse junction targeted, bone contact made, confirmed with  lateral imaging. Motor stimulation x3 volts demonstrated no lower extremity  twitch and then 1 mL of the Depo-Medrol lidocaine solution injected followed  by RF lesioning at 70 degrees x 70 seconds.  The patient tolerated the  procedure well.  Post injection instructions given. She is to follow-up with  Dr. [name] during her next regularly scheduled appointment.      Erick Colace, M.D.  Electronically Signed     AEK/MEDQ  D:  07/19/2006 14:42:45  T:  07/20/2006 21:19:10  Job:  [id number]

## 2011-02-27 NOTE — Assessment & Plan Note (Signed)
[name] is back regarding her low back pain.  She has been usually stable  with her low back.  A strain underneath her right shoulder blade  yesterday while lifting a few six packs of soda and moving them.  She is  also complaining of more heel pain, particularly in the mornings when  she awakens.  We have injected her heels in the past with some results.  She tries to wear heeled shoes.  She is noting that the cushioning  seemed to coincide with walking more at home.  She rates her pain a 9  out of 10 today.  She states that a sharp burning, stabbing, and  sometimes tingling and aching.  Pain interferes with general activity,  relations with others, enjoyment of life on a moderate plus level.   SOCIAL HISTORY:  Is without change.  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   REVIEW OF SYSTEMS:  The patient reports weakness, numbness, trouble  walking in the mornings, spasms, weight gain, abdominal pain, poor  appetite.  She has been trying to work on eating more appropriately and  losing weight but she cannot seem to shed the pounds and feels that if  anything she is gaining them now.  She has seen Dr. Dagoberto Evans for endocrine  followup.   PHYSICAL EXAMINATION:  Blood pressure 138/88, pulse is 107.  Respiratory  rate 16.  She is satting on 97% room air.  The patient is pleasant, no acute distress.  She is alert and oriented x3.  Affect is bright and appropriate. Gait is  stable.  She has fair low back range of motion, although some pain with  facet maneuvers in the low back.  She is painful on the Rhomboids on the  right at approximately T3 and T4.  Is worse with shoulder flexion and  abduction.  NECK:  Is generally stable with a few tender points in the lower  cervical region.  Strength is generally 5 out of 5.  Sensory exam is intact.  She does  appear to have gained some more weight.  HEART:  Was regular.  CHEST:  Was clear.  ABDOMEN:  Soft,  nontender.  Cognitively she is appropriate.   ASSESSMENT:  1. Chronic lumbar facet syndrome.  2. Depression/anxiety.  3  Cervical spondylosis and myofascial pain.  1. History of left leg length discrepancy  2. Bilateral heel spurs.  3. Continued fatigue and weight gain with endocrinology.  The      evaluation is still in process.   PLAN:  1. Await results of Dr. Chipper Evans workup.  2. Before we will prescribe any dietary modifications, I would like to      see the results of her workup.  We reviewed and discussed the      appropriate foods today and exercise.  We need to focus on heart      rate increasing/aerobic exercise to help her burn calories.  She      can also benefit from some light weight lifting as well.  She needs      to vary up her aerobic activities so as to not overtax her feet.  I      recommended biking, aerobics, pool, possible elliptical machine      etc.  3. To use Vicodin for breakthrough pain.  4. Use Effexor and Xanax for mood.  5. We will see the patient back in about 4 months' time.  [name], M.D.  Electronically Signed     ZTS/MedQ  D:  12/06/2006 11:26:45  T:  12/06/2006 12:25:04  Job #:  [id number]

## 2011-02-27 NOTE — Consult Note (Signed)
[name], [name]                           ACCOUNT NO.:  [id number]   MEDICAL RECORD NO.:  [id number]                   PATIENT TYPE:  REC   LOCATION:  [facility]                                  FACILITY:  [facility]   PHYSICIAN:  Sondra Come, D.O.                 DATE OF BIRTH:  Sep 10, 1956   DATE OF CONSULTATION:  08/14/2002  DATE OF DISCHARGE:                                   CONSULTATION   Thank you very much for kindly referring this [facility] to [facility] for evaluation of low back pain.  The patient  was evaluated in our clinic today.  Please refer to the following for  details regarding the history, physical examination, and treatment plan.  Once again, thank you for allowing [place] to participate in the care of this  patient.   CHIEF COMPLAINT:  Low back pain.   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  She has had a 12-year history of low back pain which  she describes as nonradiating, worse with walking, and improved with  sitting.  She notes significant morning stiffness, which improves after 30-  60 minutes.  She also notes increased low back pain with weather changes.  She has been treated with physical therapy and medications in the past for  her low back pain.  She is currently performing a home exercise program.  She described single knee and double knee to the chest stretching exercises,  as well as bed bug exercises.  She also walks one mile per day and has begun  using an ergonomical chair for her job.  She denies any fevers, chills,  night sweats, and weight loss.  She denies bowel and bladder dysfunction.  Her pain is rated as an 8/10 on a subjective scale and described as  constant, aching, burning, and stabbing, worse with walking, bending, and  prolonged sitting, as well as working, and improved  with rest, heat, ice,  and medications, which currently include hydrocodone 5 mg/500 mg two p.o.  t.i.d. as needed.  This has been prescribed by Gregary Signs A. Everardo All, M.D., and  the patient states that she has refills remaining until the first of the  year.  I reviewed records that the patient brings with her today.  She  apparently had a lumbar myelogram in 1992 which revealed a small central  disk bulge at L5-S1, as well as a minimal disk bulge at L4-5.  She has  recently been followed by Kerrin Champagne, M.D., who had given her trials of  Celebrex, Vioxx, and Cataflam, which she states tore her stomach up and did  not really help the pain.  She was then referred to the rehabilitation  center in [place], [place], and she  began seeing Adele Dan,  M.D., as well as the physical therapist and psychologist in their  comprehensive pain program.  She followed by him for approximately a year  until she recently got married and now states that it is too far to drive.  She states that Dr. Mariel Craft primarily treated her with Motrin 800 mg, which  only gave her some temporary relief.  She has also tried Ultram which  offered temporary relief, but nothing has been as helpful as the  hydrocodone.  She also notes that she has a considerable amount of stress at  work secondary to [employer] and she is somewhat  fearful of losing her job.  She notes that the increased stress tends to  worsen her low back pain.  Further treatment trials have included a TENS  unit, which she states helped to some degree, but was very irritating to her  skin.  In addition, she notes that she had some type of steroid injections  in 1991 for neck pain.  The way she describes it, these sound like trigger  point injections, but she states that the steroids caused her face to break  out.  Her function and quality of life indices have declined.  Her sleep is  fair.  I reviewed the health and history  form and 14-point review of  systems.   PAST MEDICAL HISTORY:  1. Migraine headaches.  2. Panic attacks.  3. Depression.  4. Hypertriglyceridemia.  5. Questionable herpes zoster, which is being followed by Gregary Signs A. Everardo All,     M.D.   PAST SURGICAL HISTORY:  1. Tonsillectomy.  2. Bunionectomy.  3. Tubal ligation.  4. Hysterectomy.   FAMILY HISTORY:  Hypertension, diabetes, cancer, and heart disease.   SOCIAL HISTORY:  The patient denies smoking, alcohol, or illicit drug use.  She is married and continues to work for [employer]   ALLERGIES:  Questionable STEROID allergy as it caused her face to break out  following repeated steroid injections.   MEDICATIONS:  1. Hydrocodone 5 mg/500 mg up to six per day as needed.  2. Phentermine.  3. Lisinopril.  4. Actos.  5. Vitamins.   PHYSICAL EXAMINATION:  GENERAL APPEARANCE:  A healthy female in no acute  distress.  The patient is alert and oriented and very pleasant.  Mood and  affect are appropriate.  VITAL SIGNS:  The blood pressure is 152/73, pulse 109, respirations 18, and  O2 saturations 100% on room air.  BACK:  Level pelvis without scoliosis.  There is mild increased lumbar  lordosis with flattened thoracic kyphosis.  There is tenderness to palpation  of bilateral lower lumbar paraspinal muscles.  Range of motion of the lumbar  spine is full in all planes with minimal discomfort on extension and  flexion.  NEUROLOGIC:  Manual muscle testing is 5/5 in bilateral lower extremities.  Sensory examination is intact to light touch in bilateral lower extremities.  Muscle stretch reflexes are 2+/4 in bilateral patella, medial hamstrings,  and Achilles.  Straight leg raise is negative bilaterally.  Pearlean Brownie is  negative bilaterally.  The patient is noted to have fairly tight hamstrings  and hip flexor muscles bilaterally.  EXTREMITIES:  No heat, erythema, or edema in the lower extremities.  LABORATORY DATA:  MRI of the lumbar  spine dated August 10, 2000, was  reviewed.  This reveals degenerative disk changes at L4-5 and L5-S1 with  what appears to be an annular tear at L4-5.  There is also facet arthropathy  at L3-4, L4-5, and L5-S1.   IMPRESSION:  1. Chronic low back pain, mechanical and myofascial.  The patient has     degenerative disk disease of the lumbar spine, as well as lumbar facet     arthropathy.  I am uncertain of the patient's pain generator as it may be     discogenic or facet related or both.  She also has poor lower extremity     flex ability which is contributing to her pain.  2. History of panic disorder with a fair amount of psychosocial stressors     increasing her pain perception.   PLAN AND RECOMMENDATIONS:  1. I had a thorough discussion with the patient regarding treatment options.     This was an extensive consultation of one-hour duration.  There are     various options available to the patient in terms of treating her low     back pain.  We discussed not only medications, but also interventional     procedures to help decrease her pain level.  In terms of medications, we     discussed narcotic pain medication and non-narcotic alternatives.  At     this point, I would like to get her started on some non-narcotic     alternatives to see if we can decrease her narcotic exposure.  Will begin     Lidoderm patches 5%, apply up to 12 hours per day, maximum three packs at     a time, #30 with one refill.  I have asked the patient to monitor her     hydrocodone use.  2. Consider switching hydrocodone to Ultracet one to two p.o. q.d. as     needed.  Ultracet has lower potential for abuse and addiction.  3. Instructed the patient on lower extremity stretching exercise to include     hamstrings and hip flexors.  I have encouraged her to continue with her     home exercise program and give consideration to other forms of therapy to     include yoga and piliates, which may help her more with  flex ability,     stress management, and pain perception.  4. Consider diagnostic/therapeutic lumbar facet injections or medial branch     blocks.  5. If the patient is not getting any relief with the above, would consider a     pharmacologically longer acting opiate medication, such as Duragesic or a     long-acting morphine preparation which will give her more consistent     relief throughout the day if she continues to require four to six     hydrocodone every day.  6. Consider an antidepressant or anxiolytic for the patient's panic     disorder.  7. The patient is to return to the clinic in one month for reevaluation.   The patient was educated on the above findings and recommendations and  understands.  There were no barriers to communication.                                               Sondra Come, D.O.    JJW/MEDQ  D:  08/14/2002  T:  08/15/2002  Job:  [id number]   cc:   Gregary Signs A. Everardo All, M.D. [facility]

## 2011-02-27 NOTE — Procedures (Signed)
NAMEWHITLEE, [name]                           ACCOUNT NO.:  [id number]   MEDICAL RECORD NO.:  [id number]                   PATIENT TYPE:  REC   LOCATION:  [facility]                                  FACILITY:  [facility]   PHYSICIAN:  Erick Colace, M.D.           DATE OF BIRTH:  09/25/1956   DATE OF PROCEDURE:  10/01/2003  DATE OF DISCHARGE:                                 OPERATIVE REPORT   PROCEDURE:  Bilateral L5 dorsal ramus, bilateral L4 and bilateral L3 medial  branch blocks.   PHYSICIAN:  Erick Colace, M.D.   INDICATIONS FOR PROCEDURE:  Back pain with suspected facet arthropathy.   INFORMED CONSENT:  An informed consent was obtained after describing the  risks and benefits of the procedure with the patient.  These included  bleeding, bruising, infection, paralysis and loss of bowel and bladder  function.   DESCRIPTION OF PROCEDURE:  The patient was placed prone on the fluoroscopy  table.  The back was prepped with Betadine and draped in a sterile fashion.  Fluoroscopic imaging was utilized to guide injections.  The skin and  subcutaneous tissues were infiltrated with 2 mL of 1% lidocaine at each of  the stick injection sites.  The injection sites were as follows:  The sacral  groove between the ala, the sacrum and the S1 superior articular process  bilaterally, the junction of the L5 superior articular process and  transverse process bilaterally, and the junction of the L4 superior  articular process, and the transverse process bilaterally.  A #22 gauge 3.5  inch spinal needle was utilized for the injection.  Multiple fluoroscopic  images were utilized for each injection.  Once bone contact was made and  confirmed with both oblique and AP images, 0.3 mL of Omnipaque 180 were  injected into each site, confirming the appropriate location and showing no  evidence of intervascular uptake.  Then 0.3 mL of 2% methyl paraffin-free  lidocaine were injected into each site.  The  patient tolerated the procedure well.  Post-injection instructions were  given.  The pre-procedure pain level 8/10.  Post-procedure pain level was 0/10.   DISPOSITION:  The patient is to call back within one to two days, indicating  pain relief, and with greater than 50% will refer on to Dr. Celene Kras for  lumbar radiofrequency ablation.                                                Erick Colace, M.D.    AEK/MEDQ  D:  10/01/2003 15:34:35  T:  10/01/2003 17:13:26  Job:  [id number]

## 2011-02-27 NOTE — Assessment & Plan Note (Signed)
[name] is back regarding her low back pain. Back has been fairly stable as of  late. She seems to be well with her Vicodin and stretching. She does  complain of more left heel pain as of late. Pain is worse in the morning;  usually improves somewhat with walking. She wears her gel pad in heel with  no complaints of pain. She rates the pain overall at 8/10. She describes the  pain as burning and aching and interferes with general activity in  relationship to enjoyment of life on a significant level. Sleep is fair. She  remains on Norco 7.5 mg one q.6 h. p.r.n., Flexeril 10 mg three times a day  p.r.n., Xanax 0.5 mg t.i.d. p.r.n.   REVIEW OF SYSTEMS:  The patient reports some spasms, trouble walking,  anxiety, recent weight gain, abdominal pain, coughing, sleep apnea. 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 She goes for followup in the next week or two.  Full review is in health history section of the chart.   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   PHYSICAL EXAMINATION:  VITAL SIGNS:  Blood pressure 145/66, pulse 95,  respirations 16. She is saturating 99% on room air.  GENERAL:  The patient is pleasant in no acute distress.  NEUROLOGICAL:  She is alert and oriented x3. Affect is bright and  appropriate. Coordination is fair. Reflexes are 2+ . Her left heel is  painful with weightbearing today. She had pain with palpation over the left  medial calcaneus area. Plantar fascia stretching and movement cause minimal  discomfort today.  SKIN:  Intact. She is wearing appropriate shoe wear today.  HEART:  Regular rate and rhythm.  LUNGS:  Clear.  ABDOMEN:  Soft, nontender.  BACK:  Low back examination was fairly stable.   ASSESSMENT:  1.  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  2.  Cervical spondylosis with myofascial pain.  3.  History of left leg  discrepancy on the left.  4.  Left heel pain consistent with heel spurs.   PLAN:  1.  After informed consent we injected into the left calcaneus with 2 cc of      1% lidocaine and 30 mg of Kenalog. The patient tolerated this well.      Sterile technique was used. The patient was advised to continue with      heel cushioning and appropriate shoe wear.  2.  Continue range of motion and appropriate diet and exercise as tolerated.  3.  I refilled Norco 7.5 mg today as well as Xanax 0.5 mg t.i.d. p.r.n.  4.  I will see the patient back in about two months time.      [name], M.D.  Electronically Signed     ZTS/MedQ  D:  01/13/2006 11:36:42  T:  01/14/2006 10:31:30  Job #:  [id number]   cc:   Gregary Signs A. Everardo All, M.D. [facility]  [address]  [place]  [place] [address]

## 2011-02-27 NOTE — Assessment & Plan Note (Signed)
MEDICAL RECORD NUMBER:  [id number].   [name] is back regarding her back pain. She has had increased knee pain over  the last couple of weeks. Knee has been bothering her more with direct  contact while kneeling as well as climbing steps. She has some problems  sitting and bending in general. Low back has been fairly stable. She rates  her knee pain as an 8/10 essentially. She has use some Vicodin for  breakthrough symptoms. She has really tried nothing new. She does have a  soft knee sleeve that she has used in the past.   SOCIAL HISTORY:  The patient continues to work. She experienced a lot of  stress related to her dad's colon cancer which was removed recently.   REVIEW OF SYSTEMS:  The patient reports some reflux, blurred, vision,  anxiety, depression, problems sleeping, occasional spasm. She has gained  some weight as her activity has gone down.   PHYSICAL EXAMINATION:  Blood pressure is 147/79, pulse 100, respiratory rate  20. She is saturating 98% on room air. The patient walks with a fairly  normal gait. Affect is bright and appropriate. Appearance is well kept. She  is not wearing her shoe lift today on the left side. The right knee is  slightly swollen. There is pain along the distal path of the patella and the  tibia portion of the tendon. Pain is worse with kneeling directly and  somewhat with squatting and resisted extension. She has less pain with  standing today. No knee instability was seen today. Meniscal signs are  negative. Overall strength was fairly intact in both right and left lower  extremities. Low back exam was stable.   ASSESSMENT:  1.  Facet arthropathy of lumbar spine.  2.  New right knee pain which appears to related to possibly patellar      tendinitis in addition to prepatellar bursitis.  3.  Mild obesity.  4.  Leg length discrepancy in the left. The patient has not been wearing her      shoe lift.   PLAN:  1.  I would like to send the patient for  custom shoe lifts through [employer]. If she is unable to afford this, she may consider off the      shelf insoles for added height on the left side.  2.  Advised ice t.i.d. to the right knee and to avoid direct of the knee      with kneeling. She should avoid excessive bending as well.  3.  I gave her a two weeks' trial of Celebrex 200 mg q.d.  4.  Continue Vicodin for breakthrough pain as well as Flexeril p.r.n. for      spasms.  5.  See her back in about one month's time. Consider steroid injections if      pain is persistent.      ZTS/MedQ  D:  11/25/2004 13:16:42  T:  11/25/2004 13:53:54  Job #:  [id number]

## 2011-02-27 NOTE — Assessment & Plan Note (Signed)
Tuesday, September 07, 2006:   [name] is back regarding her low back pain. Back has been doing a bit  better although she has had some persistent pain. She reports more  problems in the neck, right neck, as well as the right leg, which has  had some tingling. We tried her on Wellbutrin at her last visit for mood  and energy and she has nightmares and suicidal ideations. She went back  on the Effexor and this seems to have had a stabilizing effect and she  realized that may have been helping after all. She wonders if she is  having some acute problems with her sciatic nerve. The patient in the  leg has been sporadic. The patient overall rates her pain at an 8 to  9/10. She reports a lot of stress in her life in general. The pain  worsens with walking, general activities and improves with rest and  heat. The patient uses Vicodin for breakthrough pain usually 3-4 times a  day. Pain is an 8 to 9/10 today.   REVIEW OF SYSTEMS:  The patient reports anxiety. Continued weight gain.  She has had dry mouth on occasion as well as some abdominal pain,  nausea, spasms which are intermittent. She has some shortness of breath,  which was attributed to her anxiety.   SOCIAL HISTORY:  The patient is married and lives with her husband. She  continues to work full-time.   PHYSICAL EXAMINATION:  Blood pressure is 120/82, pulse is 90,  respiratory rate is 16. She is satting at 99% on room air. The patient  is generally pleasant in no acute distress. She is alert and oriented  x3. Weight is stable. She has some slight tenderness along the lumbar  paraspinals and facets. She had generalized pain with lumbar extension  and facet provocation today. Flexion caused minimal pain. She had mild  pain with lumbar lateral flexion and rotation. I saw no motor  discrepancies right to left in either leg today and all motor function  was 5/5. Sensory exam was intact. Reflexes were all 2+. Cranial nerve  exam was  normal.  Cognitively, she was appropriate.  HEART: Was regular.  CHEST: Was clear.  ABDOMEN: Soft and nontender.   ASSESSMENT:  1. Chronic lumbar facet syndrome.  2. Depression and anxiety.  3. History of cervical spondylosis and myofascial pain.  4. History of left leg length discrepancy.  5. Recent fatigue, which appears to be slightly improved over the last      2 weeks.   PLAN:  1. Continue range of motion exercises for back and lower extremities.  2. Will recheck serum cortisol in approximately 2 weeks time to      followup low value reported October 31.  3. Continue Effexor for mood and daytime arousal.  4. Refilled Xanax 1 mg three times a day for anxiety.  5. I will see the patient back in about 3 months' time, unless there      should be a reason to followup with her sooner. I told the patient      I will call her with her cortisol lab results.     [name], M.D.  Electronically Signed    ZTS/MedQ  D:  09/07/2006 15:21:42  T:  09/07/2006 19:51:21  Job #:  [id number]

## 2011-02-27 NOTE — Procedures (Signed)
NAMEBRENLYNN, [name]                           ACCOUNT NO.:  [id number]   MEDICAL RECORD NO.:  [id number]                   PATIENT TYPE:  REC   LOCATION:  [facility]                                  FACILITY:  [facility]   PHYSICIAN:  Celene Kras, MD                     DATE OF BIRTH:  11-27-1955   DATE OF PROCEDURE:  10/23/2003  DATE OF DISCHARGE:                                 OPERATIVE REPORT   [name].  I evaluated her  via the health and history form and 14-point review of systems, reviewed  progress to date, reviewed medical record, and discussed treatment  limitations and options with her.   1. She has demonstrated a positive provocative block after facetal injection     L5-S1, L4-5, L3-4, right and left side independent needle axis points.     Most problematic to the right side, and we are consulted today for     consideration radiofrequency neural ablation.  I will go ahead and     address the most problematic side, consider left side based on need at a     later date. I have discussed radiofrequency neural ablation,     expectations, overall directed care approach, risks, complications and     options.  Risks include neuritis, increased pain, no relief at all,     bleeding, infection, nerve damage, stroke, seizure, death and other     unforeseen problems not commonly encountered, nor commonly expected.  She     understands and wishes to proceed.  She also understands that it may take     up to a month to obtain 25 to 50% targeted relief cycling, with improved     range of motion, functional indices, and endurance.  2. Instructed to maintain contact with our physiatry colleagues.  3. No alterations in medication is provided today.  She asked for increase     in Xanax.  Cautions given.   OBJECTIVE:  Diffuse paralumbar myofascial discomfort, pain over PSIS and  notable pain on extension with Gaenslen's and Patrick's equivocal.  No  new  neurological findings, motor, sensory or reflexes.   IMPRESSION:  She has spinal disease, lumbar spine, facet syndrome.   PLAN:  Facet medial branch radiofrequency neuroablation L5-S1, L4-5, L3-4,  L2-3, contributory innervation addressed, right-side independent needle axis  points.  She has consented, at the medial branch.   The patient taken to the fluoroscopy suite and placed in the prone position.  Back prepped and draped in usual fashion.  Using a 22-gauge 10 mm active  tip, RF needle, bent tip, I advanced to the medial branch L4-S1, 4-5, 3-4, 2-  3 contributory innervation addressed, independent needle axis points  confirmed placement in multiple fluoroscopic positions.  I used Isovue 200.  I appropriately stimulate both motor and sensory.  I then  reconfirmed needle  placement at multiple points.  Prior to lesion, 1 mL of lidocaine 1% NPF is  injected at each level, total of 40 mg Aristocort in divided dose.   Lesion is performed at 70 degrees for 70 seconds at each level, and she  tolerated this well.   No complications identified.  Improved at discharge.  Discharge instructions  given.  We will see her in follow-up in about a month and a half or two  months, predicate further injection based on need and overall response.  Instructed to maintain contact with physiatry colleagues.                                                Celene Kras, MD    HH/MEDQ  D:  10/23/2003 10:14:28  T:  10/23/2003 11:11:23  Job:  [id number]

## 2011-02-27 NOTE — Op Note (Signed)
   [name], [name]                           ACCOUNT NO.:  [id number]   MEDICAL RECORD NO.:  [id number]                   PATIENT TYPE:  REC   LOCATION:  [facility]                                  FACILITY:  [facility]   PHYSICIAN:  Celene Kras, MD                     DATE OF BIRTH:  April 16, 1956   DATE OF PROCEDURE:  DATE OF DISCHARGE:                                 OPERATIVE REPORT   ADDENDUM:  The patient returns today with a driver.  We will go ahead and  proceed with the continuation of a lumbar series.  The risks, complications  and options were fully outlined.  She wishes to proceed.   PHYSICAL EXAMINATION:  Objectively no significant interval change.   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   PLAN:  Lumbar steroids, she has consented.   DESCRIPTION OF PROCEDURE:  The patient was taken to the fluoroscopy suite  and placed in the prone position.  The back was prepped and draped in the  usual fashion.  Using a Hustead needle advanced to the L4-5 interspace, no  evidence of CSF, heme or paresthesia.  A test block went uneventfully,  followed by 40 mg of Aristocort and flushed the needle.  She tolerated this  procedure well.  No complications from the procedure.  Discharge  instructions were given.   PLAN:  She will follow up.                                                Celene Kras, MD    HH/MEDQ  D:  04/10/2003  T:  04/10/2003  Job:  [id number]

## 2011-02-27 NOTE — Assessment & Plan Note (Signed)
HISTORY:  [name] is back regarding her low back pain.  She had temporary  relief with the facet injections performed by Dr. Wynn Banker.  I believe she  has radio frequency ablations set up in the near future.  She is complaining  of some right shoulder pain as she has been busy performing more work at  home, doing some shampooing and vacuuming of carpets.  She also injured her  right index finger, jamming it and hyper extending it when cleaning as well.  She has been wearing a small phalangeal splint to help protect the finger,  but still having pain a week after the incident.  She has had some swelling.  She reports no bruising.  Overall, the patient rates her pain on an average  of 7 to 8/10 today.  She uses a few hydrocodone for breakthrough pain, the  7.5 mg dose.  Her bowels are doing a bit better with decreased Zocor.   REVIEW OF SYSTEMS:  Positive for reflux, nausea, chest pain, weakness,  numbness, dizziness, depression, blurred vision, headaches, swelling,  sweating, and weight gain.   PHYSICAL EXAMINATION:  VITAL SIGNS:  The patient's blood pressure is 134/51,  pulse is 94, respiratory rate 16, she is saturating 98% on room air.  GENERAL:  The patient is pleasant, in no acute distress.  Affect is bright  and appropriate.  NEUROLOGIC:  Gait is stable.  Her back is somewhat painful with extension  today.  Flexion caused some mild discomfort.  Right index finger is painful  at the MCP.  Slight swelling was seen.  No discoloration was noted.  There  was no interphalangeal or distal phalanx tenderness today.  Right shoulder  was painful on the mid trapezius with a trigger point being palpated and  reproducing her pain symptoms.  Muscle strength is stable at 5/5.  Sensory  exam was normal.  HEART:  Regular rate and rhythm.  LUNGS:  Clear.  ABDOMEN:  Soft, nontender.  COGNITION:  Appropriate.   ASSESSMENT:  1.  Facet arthropathy of the lumbar spine.  2.  Cervical spondylosis with  myofascial pain.  3.  History of left leg length discrepancy.  4.  Left calcaneal spurs.  5.  Right index finger sprain.   PLAN:  1.  I will send the patient to radiology for an x-ray of the right hand to      rule out a metacarpophalangeal fracture.  2.  I injected the right trapezius trigger point today with 2 cc of 1%      lidocaine.  The patient tolerated well.  3.  The patient will see Dr. Wynn Banker for RF of the lumbar spine.  4.  Continue with Norco for breakthrough pain.  She is using less of this      now.  5.  I will see the patient back in about three months time.  She will see      Dr. Wynn Banker back within the month.      [name], M.D.  Electronically Signed     ZTS/MedQ  D:  05/14/2006 15:26:51  T:  05/14/2006 19:52:24  Job #:  [id number]   cc:   Gregary Signs A. Everardo All, M.D. [facility]  [address]  [place]  [place] [address]

## 2011-02-27 NOTE — Consult Note (Signed)
   NAMESHEREDA, GRAW                           ACCOUNT NO.:  [id number]   MEDICAL RECORD NO.:  [id number]                   PATIENT TYPE:  REC   LOCATION:  [facility]                                  FACILITY:  [facility]   PHYSICIAN:  Celene Kras, MD                     DATE OF BIRTH:  11/08/1955   DATE OF CONSULTATION:  04/10/2003  DATE OF DISCHARGE:                                   CONSULTATION   REASON FOR CONSULTATION:  Anniyah Mood [facility].  I evaluated her on the health and history form and 14  point review of systems.   IMPRESSION:  1. The epidural has helped her to a fairly good result, and so I will go     ahead and proceed with a second.  She does not have a driver today, so we     will arrange for this.  2. Review of her medications.  No significant changes will be made here.     Knows the rationale for performing injections to minimize escalation,     narcotic based pain medication.  3. She has currently had a cardiac risk profile, which I review, and again I     think it is important that she consider weight control as an important     strategy for a positive outcome, as well as home based diet therapy.  4. I do not believe further interventions or diagnostics are warranted.   PHYSICAL EXAMINATION:  Diffuse paralumbar myofascial discomfort.  Impaired  flexion, extension and rotation.  Gaenslen's and Patrick's equivocal.  No  new neurologic findings in motor, sensory, or reflex.   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   PLAN:  Lumbar epidural.  We will arrange for this next week.  Discharge  instructions given.  Review of procedure.                                               Celene Kras, MD    HH/MEDQ  D:  04/10/2003  T:  04/10/2003  Job:  [id number]

## 2011-02-27 NOTE — Assessment & Plan Note (Signed)
MEDICAL RECORD NUMBER:  [id number]   HISTORY OF PRESENT ILLNESS:  [name] is here in follow up of her low back  pain.  Low back has been stable but slightly worse than where she was last  time I saw her in July.  She does report increased neck pain.  She does have  some problems with cervical spondylosis and DDD.  She stated that she was  painting 5-6 weeks ago and had recurrent neck pain.  Since that time, the  pain is on top of the right shoulder as well as between the scapulae.  The  patient rates her pain at a 10/10.  States it interferes with general  function, ADL's and quality of life.  She is using four Vicodin 5/500s a day  essentially.  Flexeril helps somewhat at nighttime, but she does not use it  during the day because of work.  She tries to stick with her home exercise  program.  She has not been doing a lot of stretching or range of motion.  Her primary care physician recommended to replace her cervical collar in the  meantime.  She dose use Xanax occasionally for anxiety.   MEDICATIONS:  1.  Hydrocodone.  2.  Xanax.  3.  Cyclobenzaprine as noted above.   REVIEW OF SYSTEMS:  Positive for occasional weakness, spasms, cold symptoms,  coughing, shortness of breath, decreased sleep, depression, anxiety,  headaches, constipation, nausea, occasional diarrhea.  Other review of  systems as mentioned above.   SOCIAL HISTORY:  The patient continues to work.  Her husband is supportive.   PHYSICAL EXAMINATION:  VITAL SIGNS:  Blood pressure 132/72, pulse 90,  respirations 16, saturating 94% on room air.  GENERAL APPEARANCE:  The patient is pleasant.  She walks with a fairly  stable posture.  NEUROLOGICAL:  Affect is bright and appropriate.  She is well kept.  She has  5/5 strength in all four extremities.  Sensory exam is intact.  NECK:  Painful on the medial trapezius muscle as well as the upper rhomboid.  With palpation in these spastic areas, the patient's pain was reproduced.  Spurling's test was equivocal.  She had very little cervical paraspinal  discomfort on exam today.  Cognitively, the patient was appropriate.  I  though her mood was good overall.  Low back exam essentially was stable.   ASSESSMENT:  1.  Facet arthropathy of the lumbar spine.  2.  Right knee pain which is improved.  3.  Mild obesity.  4.  Leg length discrepancy in the left.  5.  Neck pain which appears to be myofascial in the setting of her chronic      cervical spondylosis.   PLAN:  1.  After informed consent, we injected the trapezius and the rhomboid on      the right with 2 cc of 1% lidocaine after aspiration and skin prep.      Patient tolerated this well and was given post injection instructions.  2.  In the short term, we will change her Vicodin to Norco 7.5/325 one q.6 h      p.r.n.  3.  Recommend heat and stretching massage to the neck area.  4.  If she continues to have neck symptoms into the New Year, we will      consider follow up MRI.  5.  Consider repeat RIF procedures to her lower back region as she had good      results with this two years ago.  6.  I will see the patient back in about 5-6 weeks time.      [name], M.D.  Electronically Signed     ZTS/MedQ  D:  09/11/2005 13:04:47  T:  09/11/2005 14:12:08  Job #:  [id number]   cc:   Gregary Signs A. Everardo All, M.D. [facility]  [address]  [place]  [place] [address]

## 2011-02-27 NOTE — Procedures (Signed)
[name], [name]               ACCOUNT NO.:  [id number]   MEDICAL RECORD NO.:  [id number]          PATIENT TYPE:  REC   LOCATION:  [facility]                          FACILITY:  [facility]   PHYSICIAN:  Erick Colace, M.D.DATE OF BIRTH:  02-19-1956   DATE OF PROCEDURE:  DATE OF DISCHARGE:                                 OPERATIVE REPORT   PROCEDURE:  Lumbar radiofrequency right-sided L5 dorsal ramus, L4 medial  branch, L3 medial branch with medial branch blocks at same levels under  fluoroscopic guidance.   INDICATIONS FOR PROCEDURE:  Prior RF 2004 worn off and facet etiology  reconfirmed with medial branch blocks approximately 2 months ago. Pain only  partially responsive to oral medications and other conservative care.   Informed consent was obtained after describing the risks and benefits of the  procedure to the patient. These include bleeding, bruising, loss of bowel  and bladder function, temporary or permanent paralysis and she elects to  proceed and has given written consent. The patient placed prone on the  fluoroscopy table, Betadine prep, sterile drape, 25 gauge inch and a 1/2  needle was used to anesthetize the skin and subcutaneous tissues with 2 mL  of 1% lidocaine in each of the 3 sites. Then a 20 gauge 10 cm RF needle with  a 10 mm curved active tip was inserted under fluoroscopic guidance targeting  the right S1, SAP sacroiliac junction, bone contact made, confirmed with  lateral imaging. Motor stim x3 volts demonstrated no lower extremity twitch.  A solution containing 1 mL of 40 mg per mL of Depo-Medrol and 2 mL of 1%  methylparaben free lidocaine was injected x1 mL followed by RF lesioning 70  degrees x 70 seconds. Then the right L5 SAP transverse process junction  targeted and bone contact made and confirmed with lateral imaging. Motor  stim 3 volts demonstrated no lower extremity twitch followed by injection of  1 mL of the Depo-Medrol lidocaine solution followed  by RF lesioning 70  degrees x70 seconds and last the right L4 SAP transverse process junction  targeted, bone contact made and confirmed with lateral imaging. Motor stim 3  volts demonstrated no lower extremity twitch. This is followed by injection  of the Depo-Medrol lidocaine solution x 1 mL followed by RF lesioning 70  degrees x70 seconds. The patient tolerated the procedure well. Post  procedure instructions given. Return in 1 month for left-sided lumbar RF at  the same levels.      Erick Colace, M.D.  Electronically Signed     AEK/MEDQ  D:  06/21/2006 11:07:41  T:  06/21/2006 19:25:06  Job:  [id number]

## 2011-02-27 NOTE — Assessment & Plan Note (Signed)
INTERVAL HISTORY:  [name] is back regarding her low back pain and myofascial  pain in the cervical region.  She has had some new pain develop in the left  buttock area with some tingling sensation in the foot over the last 3 or 4  days.  She does not recall any type of causative event.  Pain is at a 9-  10/10 level.  Pain is worse with walking, working, and sometimes with  sitting.  She has some problems sleeping as well.  She states this is  different than the low back pain we treated with the facet injections.  Right shoulder is fair from a pain standpoint.  She did well with the  injection.  She purchased a new left shoe insert as she felt the other one  was too hard to use and became uncomfortable on the heel of her foot.   REVIEW OF SYSTEMS:  Patient denies any new chest pain, cold, wheezing, or  coughing symptoms.  Does have occasional shortness of breath.  Denies frank  weakness in the left leg.  Does have spasms occasionally in the back and  left leg.  Has some problems with anxiety and mood as well as swelling.  She  continues to work on her weight.  She has had no new GI or GU symptoms.   PHYSICAL EXAMINATION:  Blood pressure is 115/45, pulse is 100, respiratory  rate is 20, she is saturating 99% on room air.  Patient walks with a bit of  an antalgic gait favoring the left side.  Affect is bright.  Appearance is  generally normal.  Right hemipelvis remains slightly elevated over the left  today.  I did measure leg length discrepancy and found really only 0.5 cm  difference from the ASIS to the medial malleoli bilaterally.  There was mild  levoscoliosis noted at the lumbar spine into the low thoracic spine.  PSIS  area is minimally tender.  More of her tenderness is distal to this and  seems to be some spasm along the medial portion of the gluteus maximus.  Straight leg testing is negative.  Piriformis testing and Patrick's testing  is negative.  Motor strength is 5/5 in both  lower extremities.  Sensory exam  is intact.  Reflexes are 2+.  Cervical range of motion is fair and she has  normal upper extremity motor and sensory exam today.   ASSESSMENT:  1.  Facet arthropathy of the lumbar spine.  2.  Myofascial pain of the trapezius muscles, particularly on the right      side.  3.  Mild leg length discrepancy on the left.  4.  Mild levoscoliosis.   PLAN:  1.  Some of her new pain may be due to the shoe insert attempt.  We will go      without this insert in the short term.  She may be having some spasm in      the gluteal musculature.  2.  We will increase Flexeril to t.i.d. schedule for a few days as tolerated      then back to her previous p.r.n. q.8h. schedule.  3.  I gave her a week supply of Celebrex for anti-inflammatory usage.  4.  I want her to continue with her activity and exercise program.  5.  She may use Vicodin for breakthrough pain.  6.  I will see her back in 1 month's time.       ZTS/MedQ  D:  07/25/2004 10:20:12  T:  07/25/2004 11:43:31  Job #:  [id number]

## 2011-02-27 NOTE — Assessment & Plan Note (Signed)
[facility]                           GASTROENTEROLOGY OFFICE NOTE   DEAISA, MERIDA                        MRN:          [id number]  DATE:08/27/2006                            DOB:          1955/11/27    Zyan is having recurrent chest pain just being off of her Protonix which  she could not afford.  She was last seen in May 2006.  She has had previous  endoscopy in March 2006.  She, at that time, had erosive esophagitis and an  esophageal stricture that was dilated.  Mucosal biopsies of her esophagus  apparently did not show evidence of Barrett's mucosa.  However, there was  severe ulceration of the esophagus.  Small bowel biopsy was normal.   Her main complaint today is one of extreme anxiety and depression with some  suicidal thoughts.  She recently has been taken off of Effexor and been  placed on Wellbutrin which she takes 300 mg once a day, Risperidone SR 150  mg daily, alprazolam 0.5 mg t.i.d., cyclobenzaprine 10 mg t.i.d., acyclovir  800 mg a day, and lisinopril 20 mg a day.  Her primary care physician is Dr.  Everardo All.  She has a history of hypertension, dyslipidemia, chronic anxiety  and chronic insomnia in addition to her depression.   She is a somewhat anxious appearing white female in no acute distress,  appears her stated age.  She weighs 163 pounds and blood pressure 120/82. Pulse was 92 and regular.  CHEST:  Was clear and I could not appreciate significant murmurs, gallops or  rubs.  She appeared to be in a regular rhythm.  ABDOMEN:  Was entirely benign.  EXTREMITIES:  Were unremarkable.  Mental status was clear.  She is oriented x3.  There were no gross  neurologic deficits noted.   ASSESSMENT:  1. Chest pain secondary to recurrent severe acid reflux.  2. Severe depression with suicidal ideation requiring acute psychiatric      evaluation.  3. History of chronic benzodiazepine dependency.  4. Essential hypertension well  controlled.   RECOMMENDATIONS:  1. Restart Protonix 40 mg twice a day before meals along with standard      antireflux maneuvers.  2. Referred her to behavioral health evaluation unit for possible      hospitalization for psychiatric evaluation.     Vania Rea. Jarold Motto, MD, Caleen Essex, FAGA  Electronically Signed    DRP/MedQ  DD: 08/27/2006  DT: 08/27/2006  Job #: [id number]   cc:   Gregary Signs A. Everardo All, MD

## 2011-02-27 NOTE — Assessment & Plan Note (Signed)
[name] is back regarding her low back pain.  She has had some increase in  pain since the cooler weather has come along, otherwise been stable.  She  has not yet joined [employer] for her exercise therapies.  She was waiting on  some further exercise instruction.  The pain is still predominantly in the  low back region, occasionally in the right shoulder.  It is worse with  walking, sitting, and working.  She likes to bend to relieve pain.   Her medications seem to help her pain which include:  1.  Vicodin 5/500, one q.8h. p.r.n.  2.  We have decreased her Xanax to 0.5 mg t.i.d. p.r.n.  3.  She is also using Flexeril 5-10 mg t.i.d. p.r.n.   SOCIAL HISTORY:  The patient continues to work.  She is worried somewhat  about job stability.  Otherwise she is doing well there.   REVIEW OF SYSTEMS:  The patient reports anxiety, problems with sleep at  times, no bowel or bladder changes, sweating, weight changes, or headaches.   PHYSICAL EXAMINATION:  VITAL SIGNS:  Blood pressure 150/85, pulse is 111,  respiratory rate is 16, sating 99% on room air.  GAIT:  Normal.  AFFECT:  Bright and appropriate.  APPEARANCE:  She is well kept.  MUSCULOSKELETAL:  The patient had excellent flexion today, was able to bend  80-90 degrees in the forward direction.  Extension caused pain in the back  as well as facet maneuvers to either side.  Rotation caused slight pain and  lateral flexion caused little pain at all.  The patient had mild discrepancy  of the PSIS and iliac crest today about 1-cm.  Motor exam was stable both  extremities upper and lower as well as her sensory exam.  Spurling test was  negative.  Posture was fair today.   ASSESSMENT:  1.  Facet arthropathy of the lumbar spine.  2.  Myofascial pain in the trapezius most prominently.  3.  Mild obesity which is improving.  4.  Leg length discrepancy on the left side.   PLAN:  1.  The patient is doing fairly well at this point.  I think she needs to      get involved in a regular aerobic and stretching program.  She needs to      work on her core musculature and stabilization.  She needs to focus on      further weight loss as well.  2.  She will continue with her Vicodin for breakthrough pain as well as      Flexeril p.r.n. for spasms.  3.  Could consider further RF followup ablations if pain is persistent into      the next year.  4.  I will see her back in about three months' time.     ZTS/MedQ  D:  08/27/2004 12:35:07  T:  08/27/2004 16:04:22  Job #:  [id number]   cc:   Gregary Signs A. Everardo All, M.D. [facility]

## 2011-03-30 ENCOUNTER — Encounter: Payer: Self-pay | Admitting: Internal Medicine

## 2011-03-30 ENCOUNTER — Ambulatory Visit (INDEPENDENT_AMBULATORY_CARE_PROVIDER_SITE_OTHER): Payer: 59 | Admitting: Internal Medicine

## 2011-03-30 VITALS — BP 112/80 | HR 92 | Temp 98.8°F | Ht 61.0 in | Wt 163.1 lb

## 2011-03-30 DIAGNOSIS — F411 Generalized anxiety disorder: Secondary | ICD-10-CM

## 2011-03-30 DIAGNOSIS — J209 Acute bronchitis, unspecified: Secondary | ICD-10-CM | POA: Insufficient documentation

## 2011-03-30 DIAGNOSIS — I10 Essential (primary) hypertension: Secondary | ICD-10-CM

## 2011-03-30 MED ORDER — AZITHROMYCIN 250 MG PO TABS: ORAL_TABLET | ORAL | Status: AC

## 2011-03-30 MED ORDER — HYDROCODONE-HOMATROPINE 5-1.5 MG/5ML PO SYRP: 5.0000 mL | ORAL_SOLUTION | Freq: Four times a day (QID) | ORAL | Status: AC | PRN

## 2011-03-30 NOTE — Assessment & Plan Note (Signed)
stable overall by hx and exam, most recent data reviewed with pt, and pt to continue medical treatment as before  Lab Results  Component Value Date   WBC 6.5 08/04/2010   HGB 13.5 08/04/2010   HCT 39.3 08/04/2010   PLT 230.0 08/04/2010   CHOL 215* 08/04/2010   TRIG 234.0* 08/04/2010   HDL 57.10 08/04/2010   LDLDIRECT 115.8 08/04/2010   ALT 21 08/04/2010   AST 19 08/04/2010   NA 137 08/04/2010   K 5.2* 08/04/2010   CL 101 08/04/2010   CREATININE 0.8 08/04/2010   BUN 16 08/04/2010   CO2 29 08/04/2010   TSH 1.71 08/04/2010   HGBA1C 5.6 02/11/2011

## 2011-03-30 NOTE — Progress Notes (Signed)
  Subjective:    Patient ID: [name], female    DOB: 1956-02-23, 55 y.o.   MRN: [id number]  HPI Here with acute onset mild to mod 2-3 days ST, HA, general weakness and malaise, with prod cough greenish sputum, but Pt denies chest pain, increased sob or doe, wheezing, orthopnea, PND, increased LE swelling, palpitations, dizziness or syncope.  Pt denies new neurological symptoms such as new headache, or facial or extremity weakness or numbness  Pt denies polydipsia, polyuria.  Denies worsening depressive symptoms, suicidal ideation, or panic, though has ongoing anxiety, not increased recently.  Past Medical History  Diagnosis Date  . ANXIETY 06/10/2007  . ASYMPTOMATIC POSTMENOPAUSAL STATUS 07/19/2009  . Cough 04/02/2009  . DYSLIPIDEMIA 06/28/2008  . GERD 06/10/2007  . 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 HYPERGLYCEMIA 06/28/2008  . HYPERTENSION 06/10/2007  . Irritable bowel syndrome 06/28/2008  . OSTEOARTHRITIS 06/10/2007  . UPPER RESPIRATORY INFECTION 03/26/2009  . WEIGHT GAIN 01/21/2010   Past Surgical History  Procedure Date  . Abdominal hysterectomy 1999  . Tonsillectomy 1981  . Tubal ligation 1997  . Bunions removed 1988  . Edg 12/17/2004  . Electrocardiogram 05/27/2007    reports that she has never smoked. She does not have any smokeless tobacco history on file. Her alcohol and drug histories not on file. family history includes Cancer in her father and mother. Allergies not on file No current outpatient prescriptions on file prior to visit.   Review of Systems Review of Systems  Constitutional: Negative for diaphoresis and unexpected weight change.  HENT: Negative for drooling and tinnitus.   Eyes: Negative for photophobia and visual disturbance.  Respiratory: Negative for choking and stridor.   Gastrointestinal: Negative for vomiting and blood in stool.     Objective:   Physical Exam BP 112/80  Pulse 92  Temp(Src) 98.8 F (37.1 C) (Oral)  Ht 5\' 1"  (1.549 m)  Wt 163 lb 2 oz (73.993 kg)  BMI  30.82 kg/m2  SpO2 97% Physical Exam  VS noted, mild ill  Constitutional: Pt appears well-developed and well-nourished.  HENT: Head: Normocephalic.  Right Ear: External ear normal.  Left Ear: External ear normal.  Bilat tm's mild erythema.  Sinus nontender.  Pharynx mild erythema Eyes: Conjunctivae and EOM are normal. Pupils are equal, round, and reactive to light.  Neck: Normal range of motion. Neck supple.  Cardiovascular: Normal rate and regular rhythm.   Pulmonary/Chest: Effort normal and breath sounds normal.  Neurological: Pt is alert. No cranial nerve deficit.  Skin: Skin is warm. No erythema.  Psychiatric: Pt behavior is normal. Thought content normal. 1+ nervous        Assessment & Plan:

## 2011-03-30 NOTE — Assessment & Plan Note (Signed)
stable overall by hx and exam, most recent data reviewed with pt, and pt to continue medical treatment as before  BP Readings from Last 3 Encounters:  03/30/11 112/80  08/12/10 120/72  01/21/10 112/80

## 2011-03-30 NOTE — Patient Instructions (Signed)
Take all new medications as prescribed Continue all other medications as before  

## 2011-03-30 NOTE — Assessment & Plan Note (Signed)
Mild to mod, for antibx course,  to f/u any worsening symptoms or concerns 

## 2011-05-25 ENCOUNTER — Other Ambulatory Visit: Payer: Self-pay | Admitting: Internal Medicine

## 2011-05-25 NOTE — Telephone Encounter (Signed)
Needs OV.  

## 2011-05-26 ENCOUNTER — Other Ambulatory Visit: Payer: Self-pay | Admitting: Internal Medicine

## 2011-05-26 NOTE — Telephone Encounter (Signed)
Pharmacy advised  

## 2011-06-12 ENCOUNTER — Encounter: Payer: 59 | Attending: Physical Medicine & Rehabilitation | Admitting: Physical Medicine & Rehabilitation

## 2011-06-12 ENCOUNTER — Ambulatory Visit: Payer: 59 | Admitting: Physical Medicine & Rehabilitation

## 2011-06-12 DIAGNOSIS — S6390XA Sprain of unspecified part of unspecified wrist and hand, initial encounter: Secondary | ICD-10-CM

## 2011-06-12 DIAGNOSIS — M545 Low back pain, unspecified: Secondary | ICD-10-CM | POA: Insufficient documentation

## 2011-06-12 DIAGNOSIS — F3289 Other specified depressive episodes: Secondary | ICD-10-CM | POA: Insufficient documentation

## 2011-06-12 DIAGNOSIS — M47817 Spondylosis without myelopathy or radiculopathy, lumbosacral region: Secondary | ICD-10-CM | POA: Insufficient documentation

## 2011-06-12 DIAGNOSIS — M23302 Other meniscus derangements, unspecified lateral meniscus, unspecified knee: Secondary | ICD-10-CM | POA: Insufficient documentation

## 2011-06-12 DIAGNOSIS — M5380 Other specified dorsopathies, site unspecified: Secondary | ICD-10-CM

## 2011-06-12 DIAGNOSIS — F329 Major depressive disorder, single episode, unspecified: Secondary | ICD-10-CM | POA: Insufficient documentation

## 2011-06-12 DIAGNOSIS — M47812 Spondylosis without myelopathy or radiculopathy, cervical region: Secondary | ICD-10-CM

## 2011-06-12 NOTE — Assessment & Plan Note (Signed)
[name] is back regarding her back pain.  She has been doing better over the last few months.  She attributes some of that may be she cares for and should be more active.  Her mood is improved.  She is using appropriate diet and increased exercise.  Her pain is about 7-8/10.  She had a hormonal followup lab panel apparently through Dr. George Hugh office and this all looked okay.  Sleep is fair.  REVIEW OF SYSTEMS:  Notable for occasional spasms and muscle cramping. She has some depression and anxiety, but this is improved.  Full 12- point review is in the written health and history section of the chart.  SOCIAL HISTORY:  Unchanged.  She is becoming more involved in her church.  She would like to do some work potentially part-time at [employer] or if nothing else volunteering.  PHYSICAL EXAMINATION:  VITAL SIGNS:  Blood pressure is 130/49, pulse 89, respiratory rate 18, and she is saturating 97% on room air. GENERAL:  The patient is pleasant and alert. MUSCULOSKELETAL:  Back range of motion seems to be generally improved with better flexion today.  She was able to bend at least 85 to 90 degrees.  Strength is 4-5/5 with some pain inhibition at the knees. HEART:  Regular. CHEST:  Clear. ABDOMEN:  Soft and nontender. NEUROLOGIC:  Cognitively, she is alert, appropriate.  ASSESSMENT: 1. History of chronic lumbar facet disease with spondylosis. 2. Left knee meniscus injury. 3. Depression with anxiety.  PLAN: 1. The patient has improved further.  I think she realized the effects     of her mood upon her pain and activity levels.  She also needs to     stay active in general as well to increase level of arousal.  She     is looking at some hormonal replacement counseling and supplements.     I do not disagree with this, although I told her this is not the     entire solution to the problem.  I like her to continue working on     the resocialization and activity outside the house.  She needs  to     have some further goals and aspirations. 2. I will see her back here in about 4 months' time.  She needed no     new medication refills today.     [name], M.D. Electronically Signed    ZTS/MedQ D:  06/12/2011 12:02:39  T:  06/12/2011 12:26:08  Job #:  [id number]  cc:   Gregary Signs A. Everardo All, MD [address] [place] [address]

## 2011-06-16 ENCOUNTER — Ambulatory Visit: Payer: 59 | Admitting: Physical Medicine & Rehabilitation

## 2011-07-09 LAB — COMPREHENSIVE METABOLIC PANEL
AST: 23
Albumin: 3.9
BUN: 12
CO2: 31
Creatinine, Ser: 0.83
GFR calc non Af Amer: >60
Potassium: 4.8
Total Bilirubin: 0.4
Total Protein: 6.3

## 2011-07-09 LAB — DIFFERENTIAL
Basophils Relative: 1
Eosinophils Relative: 1
Lymphocytes Relative: 31

## 2011-07-09 LAB — CBC
MCHC: 34.1
RBC: 4.68
RDW: 13.9

## 2011-07-13 ENCOUNTER — Other Ambulatory Visit: Payer: Self-pay | Admitting: Endocrinology

## 2011-07-13 DIAGNOSIS — Z1231 Encounter for screening mammogram for malignant neoplasm of breast: Secondary | ICD-10-CM

## 2011-07-28 ENCOUNTER — Other Ambulatory Visit: Payer: Self-pay | Admitting: Endocrinology

## 2011-07-30 ENCOUNTER — Other Ambulatory Visit: Payer: Self-pay

## 2011-07-30 ENCOUNTER — Other Ambulatory Visit (INDEPENDENT_AMBULATORY_CARE_PROVIDER_SITE_OTHER): Payer: 59

## 2011-07-30 DIAGNOSIS — R7309 Other abnormal glucose: Secondary | ICD-10-CM

## 2011-07-30 LAB — HEMOGLOBIN A1C: Hgb A1c MFr Bld: 5.7 % (ref 4.6–6.5)

## 2011-08-04 ENCOUNTER — Other Ambulatory Visit: Payer: Self-pay | Admitting: Endocrinology

## 2011-08-04 ENCOUNTER — Other Ambulatory Visit (INDEPENDENT_AMBULATORY_CARE_PROVIDER_SITE_OTHER): Payer: 59

## 2011-08-04 ENCOUNTER — Ambulatory Visit (INDEPENDENT_AMBULATORY_CARE_PROVIDER_SITE_OTHER): Payer: 59 | Admitting: Endocrinology

## 2011-08-04 ENCOUNTER — Encounter: Payer: Self-pay | Admitting: Endocrinology

## 2011-08-04 VITALS — BP 114/76 | HR 90 | Temp 98.1°F | Ht 61.0 in | Wt 164.0 lb

## 2011-08-04 DIAGNOSIS — F411 Generalized anxiety disorder: Secondary | ICD-10-CM

## 2011-08-04 DIAGNOSIS — E785 Hyperlipidemia, unspecified: Secondary | ICD-10-CM

## 2011-08-04 DIAGNOSIS — E039 Hypothyroidism, unspecified: Secondary | ICD-10-CM

## 2011-08-04 DIAGNOSIS — I10 Essential (primary) hypertension: Secondary | ICD-10-CM

## 2011-08-04 DIAGNOSIS — K219 Gastro-esophageal reflux disease without esophagitis: Secondary | ICD-10-CM

## 2011-08-04 DIAGNOSIS — Z79899 Other long term (current) drug therapy: Secondary | ICD-10-CM

## 2011-08-04 DIAGNOSIS — Z23 Encounter for immunization: Secondary | ICD-10-CM

## 2011-08-04 DIAGNOSIS — Z780 Asymptomatic menopausal state: Secondary | ICD-10-CM

## 2011-08-04 LAB — TSH: TSH: 6.28 u[IU]/mL — ABNORMAL HIGH (ref 0.35–5.50)

## 2011-08-04 LAB — BASIC METABOLIC PANEL
BUN: 14 mg/dL (ref 6–23)
Chloride: 100 mEq/L (ref 96–112)
GFR: 95.74 mL/min (ref >60.00)
Potassium: 4.3 mEq/L (ref 3.5–5.1)
Sodium: 140 mEq/L (ref 135–145)

## 2011-08-04 LAB — CBC WITH DIFFERENTIAL/PLATELET
Basophils Relative: 0.4 % (ref 0.0–3.0)
Eosinophils Relative: 1.9 % (ref 0.0–5.0)
Hemoglobin: 13.2 g/dL (ref 12.0–15.0)
MCHC: 34.1 g/dL (ref 30.0–36.0)
Monocytes Absolute: 0.4 10*3/uL (ref 0.1–1.0)
Monocytes Relative: 8.2 % (ref 3.0–12.0)
Neutrophils Relative %: 49.6 % (ref 43.0–77.0)

## 2011-08-04 LAB — LIPID PANEL
Cholesterol: 197 mg/dL (ref 0–200)
Total CHOL/HDL Ratio: 4
Triglycerides: 313 mg/dL — ABNORMAL HIGH (ref 0.0–149.0)
VLDL: 62.6 mg/dL — ABNORMAL HIGH (ref 0.0–40.0)

## 2011-08-04 LAB — URINALYSIS, ROUTINE W REFLEX MICROSCOPIC
Bilirubin Urine: NEGATIVE
Ketones, ur: NEGATIVE
Urine Glucose: NEGATIVE
Urobilinogen, UA: 0.2 (ref 0.0–1.0)

## 2011-08-04 LAB — HEPATIC FUNCTION PANEL
ALT: 18 U/L (ref 0–35)
AST: 23 U/L (ref 0–37)
Bilirubin, Direct: 0.1 mg/dL (ref 0.0–0.3)
Total Protein: 7.1 g/dL (ref 6.0–8.3)

## 2011-08-04 MED ORDER — BENZONATATE 200 MG PO CAPS: 200.0000 mg | ORAL_CAPSULE | Freq: Three times a day (TID) | ORAL | Status: DC | PRN

## 2011-08-04 NOTE — Progress Notes (Signed)
Subjective:    Patient ID: [name], female    DOB: 1956-03-21, 55 y.o.   MRN: [id number]  HPI here for regular wellness examination.  He's feeling pretty well in general, and says chronic med probs are stable, except as noted below. Past Medical History  Diagnosis Date  . ANXIETY 06/10/2007  . ASYMPTOMATIC POSTMENOPAUSAL STATUS 07/19/2009  . Cough 04/02/2009  . DYSLIPIDEMIA 06/28/2008  . GERD 06/10/2007  . 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 HYPERGLYCEMIA 06/28/2008  . HYPERTENSION 06/10/2007  . Irritable bowel syndrome 06/28/2008  . OSTEOARTHRITIS 06/10/2007  . UPPER RESPIRATORY INFECTION 03/26/2009  . WEIGHT GAIN 01/21/2010    Past Surgical History  Procedure Date  . Abdominal hysterectomy 1999  . Tonsillectomy 1981  . Tubal ligation 1997  . Bunions removed 1988  . Edg 12/17/2004  . Electrocardiogram 05/27/2007    History   Social History  . Marital Status: Married    Spouse Name: N/A    Number of Children: N/A  . Years of Education: N/A   Occupational History  . Not on file.   Social History Main Topics  . Smoking status: Never Smoker   . Smokeless tobacco: Not on file  . Alcohol Use: Not on file  . Drug Use: Not on file  . Sexually Active: Not on file   Other Topics Concern  . Not on file   Social History Narrative  . No narrative on file    Current Outpatient Prescriptions on File Prior to Visit  Medication Sig Dispense Refill  . acyclovir (ZOVIRAX) 800 MG tablet Take 800 mg by mouth daily.        Marland Kitchen ALPRAZolam (XANAX) 0.5 MG tablet Take 0.5 mg by mouth at bedtime as needed. Dr. [name]       . HYDROcodone-acetaminophen (NORCO) 10-325 MG per tablet Take 1 tablet by mouth every 6 (six) hours as needed. Dr. [name]       . lisinopril-hydrochlorothiazide (PRINZIDE,ZESTORETIC) 10-12.5 MG per tablet 1/2 tablet every day      . pravastatin (PRAVACHOL) 40 MG tablet TAKE 1 TABLET BY MOUTH AT BEDTIME  30 tablet  3  . Venlafaxine HCl 225 MG TB24 Take by mouth daily.          No Known  Allergies  Family History  Problem Relation Age of Onset  . Cancer Mother     cervical  . Cancer Father     colon    BP 114/76  Pulse 90  Temp(Src) 98.1 F (36.7 C) (Oral)  Ht 5\' 1"  (1.549 m)  Wt 164 lb (74.39 kg)  BMI 30.99 kg/m2  SpO2 97%     Review of Systems  Constitutional: Negative for fever.  HENT: Negative for hearing loss.   Eyes: Negative for visual disturbance.  Respiratory:       She attributes doe to weight gain  Cardiovascular: Negative for chest pain.  Gastrointestinal: Negative for anal bleeding.  Genitourinary: Negative for hematuria.  Musculoskeletal: Negative for gait problem.  Skin: Negative for rash.  Neurological: Negative for numbness.  Hematological: Bruises/bleeds easily.  Psychiatric/Behavioral: The patient is not nervous/anxious.        Objective:   Physical Exam VS: see vs page GEN: no distress HEAD: head: no deformity eyes: no periorbital swelling, no proptosis external nose and ears are normal mouth: no lesion seen CHEST WALL: no deformity LUNGS:  Clear to auscultation BREASTS:  sees gyn CV: reg rate and rhythm, no murmur ABD: abdomen is soft, nontender.  no hepatosplenomegaly.  not distended.  no hernia GENITALIA:  sees gyn RECTAL: sees gyn MUSCULOSKELETAL: muscle bulk and strength are grossly normal.  no obvious joint swelling.  gait is normal and steady EXTEMITIES: no deformity.  no ulcer on the feet.  feet are of normal color and temp.  no edema.  There are healed (bunionect) scars bilaterally.   PULSES: dorsalis pedis intact bilat.  no carotid bruit NEURO:  cn 2-12 grossly intact.   readily moves all 4's.  sensation is intact to touch on the feet SKIN:  Normal texture and temperature.  No rash or suspicious lesion is visible.   NODES:  None palpable at the neck PSYCH: alert, oriented x3.  Does not appear anxious nor depressed.   i reviewed electrocardiogram    Assessment & Plan:  Wellness visit today, with problems  stable, except as noted.   SEPARATE EVALUATION FOLLOWS--EACH PROBLEM HERE IS NEW, NOT RESPONDING TO TREATMENT, OR POSES SIGNIFICANT RISK TO THE PATIENT'S HEALTH: HISTORY OF THE PRESENT ILLNESS: Pt states few mos of slight cough in the chest, but no assoc syncope PAST MEDICAL HISTORY reviewed and up to date today REVIEW OF SYSTEMS: Pt reports weight gain.  Depression is well-controlled PHYSICAL EXAMINATION: VITAL SIGNS:  See vs page GENERAL: no distress NECK: There is no palpable thyroid enlargement.  No thyroid nodule is palpable.  No palpable lymphadenopathy at the anterior neck. LAB/XRAY RESULTS: Lab Results  Component Value Date   TSH 6.28* 08/04/2011  IMPRESSION: Cough, uncertain etiology Hypothyroidism, new PLAN: See instruction page

## 2011-08-04 NOTE — Patient Instructions (Addendum)
Please see dr Chevis Pretty for your menopausal symptoms. please consider these measures for your health:  minimize alcohol.  do not use tobacco products.  have a colonoscopy at least every 10 years from age 54.  Women should have an annual mammogram from age 15.  keep firearms safely stored.  always use seat belts.  have working smoke alarms in your home.  see an eye doctor and dentist regularly.  never drive under the influence of alcohol or drugs (including prescription drugs).  those with fair skin should take precautions against the sun. Please do your bone-density after 08/22/11. blood tests are being requested for you today.  please call [phone] to hear your test results.  You will be prompted to enter the 9-digit "MRN" number that appears at the top left of this page, followed by #.  Then you will hear the message. i have sent a prescription to your pharmacy, for anti-cough pills. (update: i left message on phone-tree:  recheck tsh in 6 mos).

## 2011-08-05 DIAGNOSIS — E039 Hypothyroidism, unspecified: Secondary | ICD-10-CM | POA: Insufficient documentation

## 2011-08-05 LAB — LDL CHOLESTEROL, DIRECT: Direct LDL: 109.4 mg/dL

## 2011-08-11 ENCOUNTER — Ambulatory Visit
Admission: RE | Admit: 2011-08-11 | Discharge: 2011-08-11 | Disposition: A | Discharge status: Home / Self Care | Payer: 59 | Source: Ambulatory Visit | Attending: Endocrinology | Admitting: Endocrinology

## 2011-08-11 DIAGNOSIS — Z1231 Encounter for screening mammogram for malignant neoplasm of breast: Secondary | ICD-10-CM

## 2011-08-25 ENCOUNTER — Other Ambulatory Visit: Payer: 59

## 2011-08-27 ENCOUNTER — Other Ambulatory Visit: Payer: Self-pay | Admitting: Endocrinology

## 2011-09-16 ENCOUNTER — Other Ambulatory Visit: Payer: Self-pay | Admitting: Endocrinology

## 2011-10-09 ENCOUNTER — Encounter: Payer: 59 | Attending: Physical Medicine & Rehabilitation | Admitting: Physical Medicine & Rehabilitation

## 2011-10-09 DIAGNOSIS — M47812 Spondylosis without myelopathy or radiculopathy, cervical region: Secondary | ICD-10-CM

## 2011-10-09 DIAGNOSIS — M5380 Other specified dorsopathies, site unspecified: Secondary | ICD-10-CM

## 2011-10-09 DIAGNOSIS — W19XXXA Unspecified fall, initial encounter: Secondary | ICD-10-CM | POA: Insufficient documentation

## 2011-10-09 DIAGNOSIS — M5382 Other specified dorsopathies, cervical region: Secondary | ICD-10-CM

## 2011-10-09 DIAGNOSIS — S6390XA Sprain of unspecified part of unspecified wrist and hand, initial encounter: Secondary | ICD-10-CM

## 2011-10-09 DIAGNOSIS — M47817 Spondylosis without myelopathy or radiculopathy, lumbosacral region: Secondary | ICD-10-CM | POA: Insufficient documentation

## 2011-10-09 DIAGNOSIS — F341 Dysthymic disorder: Secondary | ICD-10-CM | POA: Insufficient documentation

## 2011-10-09 DIAGNOSIS — M545 Low back pain, unspecified: Secondary | ICD-10-CM | POA: Insufficient documentation

## 2011-10-09 DIAGNOSIS — IMO0002 Reserved for concepts with insufficient information to code with codable children: Secondary | ICD-10-CM | POA: Insufficient documentation

## 2011-10-09 NOTE — Assessment & Plan Note (Signed)
[name] is back regarding her multiple pain complaints.  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 She bruised her left knee and this seems to be improving.  Her back and neck have acted a bit as a result.  She has come into a realization that lifting the baby maybe a bit much for her given her pain history.  Pain today is about 8/10.  Described as sharp, burning, stabbing, aching and tingling.  Sleep is poor.  REVIEW OF SYSTEMS:  Notable for the above.  She does report some anxiety and memory loss.  Full 12 point review is in the written health and history section of the chart.  SOCIAL HISTORY:  As noted above.  PHYSICAL EXAMINATION:  VITAL SIGNS:  Blood pressure is 133/58, pulse is 94, respiratory rate is 16 and she is satting 98% on room air. GENERAL:  The patient is pleasant and alert.  At her bend for me today, she could bend to about 80-90 degrees as usual. EXTREMITIES:  She had some pain in the lower lumbar to sacral segments. She seemed to be a little bit tighter in the right paraspinal column as well in the lumbar spine.  She has trigger point in the right trapezius and some tightness and pain in the right sternocleidomastoid particularly superiorly.  She has been limited with rotation of the neck to the right as well as lateral bending to the opposite direction. Strength is grossly 5/5.  Sensory and motor exam seemed to be intact.  ASSESSMENT: 1. History of chronic lumbar facet disease with spondylosis. 2. Left knee meniscus injury. 3. Depression with anxiety. 4. Myofascial pain.  PLAN: 1. The patient needs to look at all for issues when deciding which way     to proceed from a vocational standpoint.  I think 20 pounds is the     upward limit of her regular lifting capacity.  I would like to see     her get into some sedentary work.  I offered her some help  in     getting some volunteer work at [employer] which could potentially     lead to something job wise slow down the road. 2. We could look at trigger point injections today to the right neck;     however, I would like to pursue just massage and stretching and     postural avenues. 3. She has plenty of hydrocodone left so this was not refilled today. 4. Reviewed her visit with Dr. Everardo All from October and the workup     appears benign. 5. I will see her back here in about 4 months.  She is to call with     any problems or questions.     [name], M.D. Electronically Signed    ZTS/MedQ D:  10/09/2011 12:01:26  T:  10/09/2011 12:23:07  Job #:  [id number]

## 2011-10-12 ENCOUNTER — Ambulatory Visit: Payer: 59 | Admitting: Physical Medicine & Rehabilitation

## 2011-10-16 ENCOUNTER — Ambulatory Visit: Payer: 59 | Admitting: Neurosurgery

## 2011-10-21 ENCOUNTER — Other Ambulatory Visit: Payer: Self-pay | Admitting: *Deleted

## 2011-10-21 MED ORDER — ACYCLOVIR 800 MG PO TABS: ORAL_TABLET | ORAL | Status: DC

## 2011-10-21 MED ORDER — PRAVASTATIN SODIUM 40 MG PO TABS: ORAL_TABLET | ORAL | Status: DC

## 2011-10-21 MED ORDER — LISINOPRIL-HYDROCHLOROTHIAZIDE 10-12.5 MG PO TABS: ORAL_TABLET | ORAL | Status: DC

## 2011-10-21 NOTE — Telephone Encounter (Signed)
R'cd fax from [facility] for refill of Lisinopril-HCTZ, Pravastatin and Acyclovir  Last OV-07/2011

## 2011-10-24 ENCOUNTER — Other Ambulatory Visit: Payer: Self-pay | Admitting: Endocrinology

## 2011-11-10 ENCOUNTER — Encounter: Payer: Self-pay | Admitting: Endocrinology

## 2011-11-10 ENCOUNTER — Ambulatory Visit (INDEPENDENT_AMBULATORY_CARE_PROVIDER_SITE_OTHER): Payer: 59 | Admitting: Endocrinology

## 2011-11-10 DIAGNOSIS — B9789 Other viral agents as the cause of diseases classified elsewhere: Secondary | ICD-10-CM

## 2011-11-10 DIAGNOSIS — B349 Viral infection, unspecified: Secondary | ICD-10-CM

## 2011-11-10 MED ORDER — CEFUROXIME AXETIL 250 MG PO TABS: 250.0000 mg | ORAL_TABLET | Freq: Two times a day (BID) | ORAL | Status: AC

## 2011-11-10 NOTE — Patient Instructions (Addendum)
i have sent a prescription to your pharmacy, for an antibiotic. please call if nosebleed happens again.

## 2011-11-10 NOTE — Progress Notes (Signed)
  Subjective:    Patient ID: [name], female    DOB: 02-08-1956, 56 y.o.   MRN: [id number]  HPI Pt states 4 days of moderate myalgias throughout the body, and assoc fever.  Past Medical History  Diagnosis Date  . ANXIETY 06/10/2007  . ASYMPTOMATIC POSTMENOPAUSAL STATUS 07/19/2009  . Cough 04/02/2009  . DYSLIPIDEMIA 06/28/2008  . GERD 06/10/2007  . 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 HYPERGLYCEMIA 06/28/2008  . HYPERTENSION 06/10/2007  . Irritable bowel syndrome 06/28/2008  . OSTEOARTHRITIS 06/10/2007  . UPPER RESPIRATORY INFECTION 03/26/2009  . WEIGHT GAIN 01/21/2010    Past Surgical History  Procedure Date  . Abdominal hysterectomy 1999  . Tonsillectomy 1981  . Tubal ligation 1997  . Bunions removed 1988  . Edg 12/17/2004  . Electrocardiogram 05/27/2007    History   Social History  . Marital Status: Married    Spouse Name: N/A    Number of Children: N/A  . Years of Education: N/A   Occupational History  . Not on file.   Social History Main Topics  . Smoking status: Never Smoker   . Smokeless tobacco: Not on file  . Alcohol Use: Not on file  . Drug Use: Not on file  . Sexually Active: Not on file   Other Topics Concern  . Not on file   Social History Narrative  . No narrative on file    Current Outpatient Prescriptions on File Prior to Visit  Medication Sig Dispense Refill  . ACAI PO Take 1 capsule by mouth 2 (two) times daily. 100mg        . acyclovir (ZOVIRAX) 800 MG tablet TAKE 1 TABLET BY MOUTH EVERY DAY  90 tablet  2  . ALPRAZolam (XANAX) 0.5 MG tablet Take 0.5 mg by mouth at bedtime as needed. Dr. [name]       . benzonatate (TESSALON) 200 MG capsule TAKE 1 CAPSULE (200 MG TOTAL) BY MOUTH 3 (THREE) TIMES DAILY AS NEEDED FOR COUGH.  20 capsule  0  . HYDROcodone-acetaminophen (NORCO) 10-325 MG per tablet Take 1 tablet by mouth every 6 (six) hours as needed. Dr. [name]       . lisinopril-hydrochlorothiazide (PRINZIDE,ZESTORETIC) 10-12.5 MG per tablet TAKE 1/2 TABLET BY MOUTH DAILY   30 tablet  5  . NON FORMULARY Maximum Formula Dietary Supplement  1 by mouth once daily       . pravastatin (PRAVACHOL) 40 MG tablet TAKE 1 TABLET BY MOUTH AT BEDTIME  90 tablet  2  . Venlafaxine HCl 225 MG TB24 Take by mouth daily.          No Known Allergies  Family History  Problem Relation Age of Onset  . Cancer Mother     cervical  . Cancer Father     colon    BP 128/82  Pulse 94  Temp(Src) 98 F (36.7 C) (Oral)  SpO2 98%  Review of Systems She has headache.  She had 1 episode of nosebleed yesterday (none since).      Objective:   Physical Exam VITAL SIGNS:  See vs page GENERAL: no distress head: no deformity eyes: no periorbital swelling, no proptosis external nose and ears are normal mouth: no lesion seen.   Left tm is red.  Right eac is occluded with cerumen.   LUNGS:  Clear to auscultation     Assessment & Plan:  Flu-like illness, new Epistaxis, improved

## 2011-11-14 DIAGNOSIS — B349 Viral infection, unspecified: Secondary | ICD-10-CM | POA: Insufficient documentation

## 2011-12-07 ENCOUNTER — Other Ambulatory Visit: Payer: Self-pay | Admitting: *Deleted

## 2011-12-08 ENCOUNTER — Other Ambulatory Visit: Payer: Self-pay | Admitting: *Deleted

## 2011-12-08 MED ORDER — HYDROCODONE-ACETAMINOPHEN 10-325 MG PO TABS: 1.0000 | ORAL_TABLET | Freq: Four times a day (QID) | ORAL | Status: DC | PRN

## 2011-12-10 ENCOUNTER — Telehealth: Payer: Self-pay | Admitting: Physical Medicine & Rehabilitation

## 2011-12-10 MED ORDER — VENLAFAXINE HCL ER 225 MG PO TB24: 225.0000 mg | ORAL_TABLET | Freq: Every day | ORAL | Status: DC

## 2011-12-10 NOTE — Telephone Encounter (Signed)
Spoke with [place] and informed her that the order had been sent to [facility] electronically now.

## 2011-12-10 NOTE — Telephone Encounter (Signed)
Please send Vanelaflaxin (?) refill to [facility].  Request was sent in December and not returned.  Please send back ASAP as she is almost out.  Call and let her know it is taken care of.

## 2011-12-11 ENCOUNTER — Telehealth: Payer: Self-pay | Admitting: *Deleted

## 2011-12-11 ENCOUNTER — Telehealth: Payer: Self-pay | Admitting: Physical Medicine & Rehabilitation

## 2011-12-11 MED ORDER — HYDROCODONE-ACETAMINOPHEN 10-325 MG PO TABS: 1.0000 | ORAL_TABLET | Freq: Four times a day (QID) | ORAL | Status: DC | PRN

## 2011-12-11 NOTE — Telephone Encounter (Signed)
We refillled Darleth;s Norco through [facility] on 12/08/11. Has only 3 left and they have not shipped yet. May we call her in to local pharmacy enough for a week or two?

## 2011-12-11 NOTE — Telephone Encounter (Signed)
Pain meds have not shipped yet from [facility].  Only 3 left.  Can we fill enough to get her through to 12/17/11?  [facility] should have shipped by then.    Pt called again requesting to transfer all meds from from [facility] to [facility].  EXCEPT for the Effexor, it cost too much.

## 2011-12-11 NOTE — Telephone Encounter (Signed)
I will call the local pharmacy to dispense Norco to get you through until your shipment from [facility] can arrive.

## 2011-12-14 NOTE — Telephone Encounter (Signed)
Yes, that would be fine if she still needs them at this point. i suppose, since you are going to call, that i won't write a script on epic, correct?

## 2012-01-15 ENCOUNTER — Other Ambulatory Visit: Payer: Self-pay | Admitting: *Deleted

## 2012-01-15 MED ORDER — HYDROCODONE-ACETAMINOPHEN 10-325 MG PO TABS: 1.0000 | ORAL_TABLET | Freq: Four times a day (QID) | ORAL | Status: DC | PRN

## 2012-01-15 NOTE — Telephone Encounter (Signed)
Needs hydrocodone refilled. No longer ordering thru [facility]. [facility]. Verbal w/ pt that Rx called in as requested

## 2012-01-21 ENCOUNTER — Telehealth: Payer: Self-pay | Admitting: Physical Medicine & Rehabilitation

## 2012-01-21 NOTE — Telephone Encounter (Signed)
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  Needs letter stating that she is only to lift 20#'s.

## 2012-01-22 ENCOUNTER — Telehealth: Payer: Self-pay | Admitting: *Deleted

## 2012-01-22 ENCOUNTER — Encounter: Payer: Self-pay | Admitting: Physical Medicine & Rehabilitation

## 2012-01-22 NOTE — Telephone Encounter (Signed)
Letter received from Dr [name], Kriste Basque was notified of it being ready for pick up.

## 2012-01-22 NOTE — Telephone Encounter (Signed)
Message left to let Teresha know her letter she requested is ready to pick up

## 2012-02-02 ENCOUNTER — Other Ambulatory Visit: Payer: Self-pay | Admitting: Endocrinology

## 2012-02-05 ENCOUNTER — Encounter: Payer: Self-pay | Admitting: Physical Medicine & Rehabilitation

## 2012-02-05 ENCOUNTER — Encounter: Payer: 59 | Attending: Physical Medicine & Rehabilitation | Admitting: Physical Medicine & Rehabilitation

## 2012-02-05 VITALS — BP 147/82 | HR 77 | Resp 16 | Ht 61.0 in | Wt 168.0 lb

## 2012-02-05 DIAGNOSIS — M5380 Other specified dorsopathies, site unspecified: Secondary | ICD-10-CM | POA: Insufficient documentation

## 2012-02-05 DIAGNOSIS — M47817 Spondylosis without myelopathy or radiculopathy, lumbosacral region: Secondary | ICD-10-CM

## 2012-02-05 DIAGNOSIS — S99919A Unspecified injury of unspecified ankle, initial encounter: Secondary | ICD-10-CM

## 2012-02-05 DIAGNOSIS — M47816 Spondylosis without myelopathy or radiculopathy, lumbar region: Secondary | ICD-10-CM | POA: Insufficient documentation

## 2012-02-05 DIAGNOSIS — S8990XA Unspecified injury of unspecified lower leg, initial encounter: Secondary | ICD-10-CM

## 2012-02-05 DIAGNOSIS — S838X9A Sprain of other specified parts of unspecified knee, initial encounter: Secondary | ICD-10-CM

## 2012-02-05 DIAGNOSIS — F341 Dysthymic disorder: Secondary | ICD-10-CM | POA: Insufficient documentation

## 2012-02-05 DIAGNOSIS — M1990 Unspecified osteoarthritis, unspecified site: Secondary | ICD-10-CM

## 2012-02-05 NOTE — Patient Instructions (Signed)
Continue to ramp up your physical activity to increase your  metabolism

## 2012-02-05 NOTE — Progress Notes (Signed)
  Subjective:    Patient ID: [name], female    DOB: May 10, 1956, 56 y.o.   MRN: [id number]  HPI   [name] is back regarding her chronic pain syndrome.  She has been exercising more. She is frustrated that she's gaining weight deepsite exercising more and eating better.   Her hips are bothering on occasional especially at night.  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    Pain Inventory Average Pain 8 Pain Right Now 8 My pain is sharp, burning, dull, tingling and aching  In the last 24 hours, has pain interfered with the following? General activity 9 Relation with others 9 Enjoyment of life 9 What TIME of day is your pain at its worst? all of the time Sleep (in general) Fair  Pain is worse with: sitting, inactivity, standing, unsure and some activites Pain improves with: rest, heat/ice and medication Relief from Meds: 7  Mobility walk without assistance use a cane how many minutes can you walk? 1 hr ability to climb steps?  yes do you drive?  yes Do you have any goals in this area?  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  no  Physicians involved in your care Any changes since last visit?  no      Review of Systems  Constitutional: Positive for appetite change.       Poor appetite  Respiratory: Positive for cough.   Cardiovascular: Positive for leg swelling.  Gastrointestinal: Positive for abdominal pain.  Musculoskeletal: Positive for back pain and gait problem.       Spasms  Neurological: Positive for dizziness, weakness and numbness.  Psychiatric/Behavioral: Positive for dysphoric mood. The patient is nervous/anxious.   All other systems reviewed and  are negative.       Objective:   Physical Exam  Constitutional: She is oriented to person, place, and time. She appears well-developed and well-nourished.  HENT:  Head: Normocephalic and atraumatic.  Eyes: Conjunctivae and EOM are normal. Pupils are equal, round, and reactive to light.  Neck: Normal range of motion.  Cardiovascular: Normal rate.   Pulmonary/Chest: Effort normal and breath sounds normal.  Abdominal: Soft. Bowel sounds are normal.  Musculoskeletal: Normal range of motion.       Back tender with extension, but she can touch toes. Gait is normal.   Neurological: She is alert and oriented to person, place, and time.  Skin: Skin is warm.  Psychiatric: She has a normal mood and affect. Her behavior is normal. Judgment and thought content normal.          Assessment & Plan:  ASSESSMENT:  1. History of chronic lumbar facet disease with spondylosis.  2. Left knee meniscus injury.  3. Depression with anxiety.  4. Myofascial pain.   PLAN:  1. We discussed diet and exercise today, and she needs to work on more vigorous exercise and more appropriate dietary intake. 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  2. Recommended sleeping on back instead of sides to avoid irritating hips 3. Hydrocodone was not refilled as she has plenty left. 4. I'll see her back in 4 months.

## 2012-02-12 ENCOUNTER — Telehealth: Payer: Self-pay | Admitting: Physical Medicine & Rehabilitation

## 2012-02-12 MED ORDER — HYDROCODONE-ACETAMINOPHEN 10-325 MG PO TABS: 1.0000 | ORAL_TABLET | Freq: Four times a day (QID) | ORAL | Status: DC | PRN

## 2012-02-12 NOTE — Telephone Encounter (Signed)
[facility]  Will be out tomorrow.  Attempted to call patient for exact meds, lvm

## 2012-03-09 ENCOUNTER — Telehealth: Payer: Self-pay | Admitting: Physical Medicine & Rehabilitation

## 2012-03-09 MED ORDER — ALPRAZOLAM 0.5 MG PO TABS: 0.5000 mg | ORAL_TABLET | Freq: Every evening | ORAL | Status: DC | PRN

## 2012-03-09 MED ORDER — HYDROCODONE-ACETAMINOPHEN 10-325 MG PO TABS: 1.0000 | ORAL_TABLET | Freq: Four times a day (QID) | ORAL | Status: DC | PRN

## 2012-03-09 NOTE — Telephone Encounter (Signed)
Needs refill on Xanax and will need refill on Hydrocodone.

## 2012-03-09 NOTE — Telephone Encounter (Signed)
Rx's have been called in, pt aware. 

## 2012-03-16 ENCOUNTER — Other Ambulatory Visit: Payer: Self-pay

## 2012-03-17 ENCOUNTER — Telehealth: Payer: Self-pay | Admitting: *Deleted

## 2012-03-17 MED ORDER — ALPRAZOLAM 0.5 MG PO TABS: 0.5000 mg | ORAL_TABLET | Freq: Three times a day (TID) | ORAL | Status: DC

## 2012-03-17 NOTE — Telephone Encounter (Signed)
Refill Xanax. Last time it was refilled she only received #30 1 QHS but she normally gets 1 TID #90. Please check into this.

## 2012-03-17 NOTE — Telephone Encounter (Signed)
Rx has been called in, pt aware. 

## 2012-04-07 ENCOUNTER — Other Ambulatory Visit: Payer: Self-pay | Admitting: Endocrinology

## 2012-04-11 ENCOUNTER — Other Ambulatory Visit: Payer: Self-pay | Admitting: Physical Medicine & Rehabilitation

## 2012-04-11 ENCOUNTER — Other Ambulatory Visit: Payer: Self-pay | Admitting: Endocrinology

## 2012-04-12 ENCOUNTER — Other Ambulatory Visit: Payer: Self-pay | Admitting: *Deleted

## 2012-04-12 MED ORDER — HYDROCODONE-ACETAMINOPHEN 10-325 MG PO TABS: 1.0000 | ORAL_TABLET | Freq: Four times a day (QID) | ORAL | Status: DC | PRN

## 2012-04-12 MED ORDER — ALPRAZOLAM 0.5 MG PO TABS: 0.5000 mg | ORAL_TABLET | Freq: Three times a day (TID) | ORAL | Status: DC

## 2012-05-10 ENCOUNTER — Telehealth: Payer: Self-pay | Admitting: Physical Medicine & Rehabilitation

## 2012-05-10 ENCOUNTER — Other Ambulatory Visit: Payer: Self-pay | Admitting: *Deleted

## 2012-05-10 MED ORDER — ALPRAZOLAM 0.5 MG PO TABS: 0.5000 mg | ORAL_TABLET | Freq: Three times a day (TID) | ORAL | Status: DC

## 2012-05-10 MED ORDER — HYDROCODONE-ACETAMINOPHEN 10-325 MG PO TABS: 1.0000 | ORAL_TABLET | Freq: Four times a day (QID) | ORAL | Status: DC | PRN

## 2012-05-10 NOTE — Telephone Encounter (Signed)
Lm informing pt medication was called in.

## 2012-05-10 NOTE — Telephone Encounter (Signed)
Hydrocodone last time was for #120, should be #136.  Also needs Alprazolam.

## 2012-05-13 ENCOUNTER — Emergency Department (HOSPITAL_COMMUNITY): Payer: 59

## 2012-05-13 ENCOUNTER — Encounter (HOSPITAL_COMMUNITY): Payer: Self-pay

## 2012-05-13 ENCOUNTER — Emergency Department (HOSPITAL_COMMUNITY)
Admission: EM | Admit: 2012-05-13 | Discharge: 2012-05-13 | Disposition: A | Discharge status: Home / Self Care | Payer: 59 | Attending: Emergency Medicine | Admitting: Emergency Medicine

## 2012-05-13 DIAGNOSIS — Y92009 Unspecified place in unspecified non-institutional (private) residence as the place of occurrence of the external cause: Secondary | ICD-10-CM | POA: Insufficient documentation

## 2012-05-13 DIAGNOSIS — Y93H2 Activity, gardening and landscaping: Secondary | ICD-10-CM | POA: Insufficient documentation

## 2012-05-13 DIAGNOSIS — Z79899 Other long term (current) drug therapy: Secondary | ICD-10-CM | POA: Insufficient documentation

## 2012-05-13 DIAGNOSIS — K589 Irritable bowel syndrome without diarrhea: Secondary | ICD-10-CM | POA: Insufficient documentation

## 2012-05-13 DIAGNOSIS — F411 Generalized anxiety disorder: Secondary | ICD-10-CM | POA: Insufficient documentation

## 2012-05-13 DIAGNOSIS — M47812 Spondylosis without myelopathy or radiculopathy, cervical region: Secondary | ICD-10-CM | POA: Insufficient documentation

## 2012-05-13 DIAGNOSIS — E785 Hyperlipidemia, unspecified: Secondary | ICD-10-CM | POA: Insufficient documentation

## 2012-05-13 DIAGNOSIS — S161XXA Strain of muscle, fascia and tendon at neck level, initial encounter: Secondary | ICD-10-CM

## 2012-05-13 DIAGNOSIS — T23009A Burn of unspecified degree of unspecified hand, unspecified site, initial encounter: Secondary | ICD-10-CM

## 2012-05-13 DIAGNOSIS — M545 Low back pain, unspecified: Secondary | ICD-10-CM | POA: Insufficient documentation

## 2012-05-13 DIAGNOSIS — G8929 Other chronic pain: Secondary | ICD-10-CM | POA: Insufficient documentation

## 2012-05-13 MED ORDER — FENTANYL CITRATE 0.05 MG/ML IJ SOLN
50.0000 ug | Freq: Once | INTRAMUSCULAR | Status: AC
  Administered 2012-05-13: 50 ug via INTRAVENOUS
  Filled 2012-05-13: qty 2

## 2012-05-13 MED ORDER — OXYCODONE-ACETAMINOPHEN 5-325 MG PO TABS: 1.0000 | ORAL_TABLET | Freq: Four times a day (QID) | ORAL | Status: AC | PRN

## 2012-05-13 MED ORDER — SODIUM CHLORIDE 0.9 % IV SOLN
Freq: Once | INTRAVENOUS | Status: AC
  Administered 2012-05-13: 15:00:00 via INTRAVENOUS

## 2012-05-13 MED ORDER — SILVER SULFADIAZINE 1 % EX CREA
TOPICAL_CREAM | Freq: Once | CUTANEOUS | Status: AC
  Administered 2012-05-13: 17:00:00 via TOPICAL
  Filled 2012-05-13: qty 85

## 2012-05-13 MED ORDER — ONDANSETRON HCL 4 MG/2ML IJ SOLN
4.0000 mg | Freq: Once | INTRAMUSCULAR | Status: AC
  Administered 2012-05-13: 4 mg via INTRAVENOUS
  Filled 2012-05-13: qty 2

## 2012-05-13 NOTE — ED Notes (Signed)
Patient transported to X-ray 
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
## 2012-05-13 NOTE — ED Notes (Signed)
Per EMS pt from home, pt on a riding lawn mower mowing an area of grass that was a steep hill and the grass was wet, wheels began spinning, lawn mower tipped over onto her, pt able to get out from under lawn mower, upon ems arrival pt a/o x4, shaking, c/o back pain and (R) index finger pain d/t burn from the lawn mower. Pt reports hx of chronic back pain and is a pt of the pain clinic. VSS bp 124/80, hr 100, rr 18. Pt rates back pain 10/10

## 2012-05-13 NOTE — ED Notes (Signed)
Pt ambulated without difficulty.  Pt denies dizzyness but c/o pain in lower back

## 2012-05-13 NOTE — ED Provider Notes (Signed)
History     CSN: [id number]  Arrival date & time 05/13/12  1436   First MD Initiated Contact with Patient 05/13/12 1510      Chief Complaint  Patient presents with  . Trauma    (Consider location/radiation/quality/duration/timing/severity/associated sxs/prior treatment) HPI Patient presenting after fall from riding lawnmower. She was brought in by EMS on long spine board and with c-collar in place. She states that she was going on he'll and the graft was slippery causing the mom were to overturn. She denies striking her head. She does complain of lower back tenderness. She has chronic low back pain which she states is worsened after the fall. She also complains of pain in her right first and second fingers after feeling that she burned her hand on the lawnmower motor.  There are no other associated systemic symptoms, there are no other alleviating or modifying factors.   Past Medical History  Diagnosis Date  . ANXIETY 06/10/2007  . ASYMPTOMATIC POSTMENOPAUSAL STATUS 07/19/2009  . Cough 04/02/2009  . DYSLIPIDEMIA 06/28/2008  . GERD 06/10/2007  . 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 HYPERGLYCEMIA 06/28/2008  . HYPERTENSION 06/10/2007  . Irritable bowel syndrome 06/28/2008  . OSTEOARTHRITIS 06/10/2007  . UPPER RESPIRATORY INFECTION 03/26/2009  . WEIGHT GAIN 01/21/2010    Past Surgical History  Procedure Date  . Abdominal hysterectomy 1999  . Tonsillectomy 1981  . Tubal ligation 1997  . Bunions removed 1988  . Edg 12/17/2004  . Electrocardiogram 05/27/2007    Family History  Problem Relation Age of Onset  . Cancer Mother     cervical  . Cancer Father     colon    History  Substance Use Topics  . Smoking status: Never Smoker   . Smokeless tobacco: Never Used  . Alcohol Use: Not on file    OB History    Grav Para Term Preterm Abortions TAB SAB Ect Mult Living                  Review of Systems ROS reviewed and all otherwise negative except for mentioned in HPI  Allergies  Review of  patient's allergies indicates no known allergies.  Home Medications   Current Outpatient Rx  Name Route Sig Dispense Refill  . ACYCLOVIR 800 MG PO TABS Oral Take 800 mg by mouth daily.    Marland Kitchen ALPRAZOLAM 0.5 MG PO TABS Oral Take 0.5 mg by mouth 3 (three) times daily as needed. For anxiety    . HYDROCODONE-ACETAMINOPHEN 10-325 MG PO TABS Oral Take 1 tablet by mouth every 6 (six) hours as needed. For pain    . IBUPROFEN PM PO Oral Take 2 tablets by mouth at bedtime as needed. For sleep    . LISINOPRIL-HYDROCHLOROTHIAZIDE 10-12.5 MG PO TABS Oral Take 0.5 tablets by mouth daily.    . ADULT MULTIVITAMIN W/MINERALS CH Oral Take 1 tablet by mouth daily.    Marland Kitchen PRAVASTATIN SODIUM 40 MG PO TABS Oral Take 40 mg by mouth daily.    Marland Kitchen RASPBERRY KETONES PO Oral Take 2 capsules by mouth 2 (two) times daily.    . VENLAFAXINE HCL ER 225 MG PO TB24 Oral Take 225 mg by mouth daily.    . OXYCODONE-ACETAMINOPHEN 5-325 MG PO TABS Oral Take 1-2 tablets by mouth every 6 (six) hours as needed for pain. 15 tablet 0    BP 154/85  Pulse 94  Temp 97.1 F (36.2 C) (Oral)  Resp 22  SpO2 98% Vitals reviewed Physical Exam Physical Examination: General  appearance - alert, anxious appearing, and in no distress Mental status - alert, oriented to person, place, and time Eyes - pupils equal and reactive, extraocular eye movements intact Head- NCAT Neck - c-collar in place, mild ttp over lower cspine Chest - clear to auscultation, no wheezes, rales or rhonchi, symmetric air entry, no chest wall tenderness to palpation or crepitus Heart - normal rate, regular rhythm, normal S1, S2, no murmurs, rubs, clicks or gallops Abdomen - soft, nontender, nondistended, no masses or organomegaly Back exam - mild lumbar midline and paraspinous tenderness, no CVA tenderness- no significant change from her chronic back pain Neurological - alert, oriented, normal speech, strength 5/5 in extremities x 4, sensation intact Musculoskeletal - no  joint tenderness, deformity or swelling, FROM without pain in extremities x 4 Extremities - peripheral pulses normal, no pedal edema, no clubbing or cyanosis Skin - 2nd degree burns over palmar surface of first and second fingers, no circumferentaial burns Psych- anxious, tearful, but cooperative  ED Course  Procedures (including critical care time)  Labs Reviewed - No data to display Dg Lumbar Spine Complete  05/13/2012  *RADIOLOGY REPORT*  Clinical Data: Low back pain after lawnmower accident.  LUMBAR SPINE - COMPLETE 4+ VIEW  Comparison: None.  Findings: Four views study shows no evidence for fracture.  Loss of disc height at L4-5 is associated with trace retrolisthesis of L4 on five.  Facets are well-aligned bilaterally.  SI joints have normal imaging features.  IMPRESSION: Degenerative spondylolisthesis at L4-5.  No acute findings.  Original Report Authenticated By: ERIC A. MANSELL, M.D.   Ct Head Wo Contrast  05/13/2012  *RADIOLOGY REPORT*  Clinical Data:  Fall from lawnmower.  Pain.  CT HEAD WITHOUT CONTRAST CT CERVICAL SPINE WITHOUT CONTRAST  Technique:  Multidetector CT imaging of the head and cervical spine was performed following the standard protocol without intravenous contrast.  Multiplanar CT image reconstructions of the cervical spine were also generated.  Comparison:   None  CT HEAD  Findings: No acute cortical infarct, hemorrhage, or mass lesion is present.  The ventricles are of normal size.  No significant extra- axial fluid collection is present.  The paranasal sinuses and mastoid air cells are clear.  The osseous skull is intact.  IMPRESSION: Negative CT of the head.  CT CERVICAL SPINE  Findings: The cervical spine is imaged from the skull base through T3-4.  There is slight rightward curvature of the cervical spine. The vertebral body heights and alignment maintained.  The chronic end plate degenerative changes are evident with loss of disc height from C3-4 through C6-7.  Osseous  foraminal narrowing is most pronounced at C5-6.  No acute fracture or traumatic subluxation is present.  The soft tissues of the neck are unremarkable.  The lung apices are clear.  IMPRESSION:  1.  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 2.  No acute fracture or traumatic subluxation.  Original Report Authenticated By: Jamesetta Orleans. MATTERN, M.D.   Ct Cervical Spine Wo Contrast  05/13/2012  *RADIOLOGY REPORT*  Clinical Data:  Fall from lawnmower.  Pain.  CT HEAD WITHOUT CONTRAST CT CERVICAL SPINE WITHOUT CONTRAST  Technique:  Multidetector CT imaging of the head and cervical spine was performed following the standard protocol without intravenous contrast.  Multiplanar CT image reconstructions of the cervical spine were also generated.  Comparison:   None  CT HEAD  Findings: No acute cortical infarct, hemorrhage, or mass lesion is present.  The ventricles  are of normal size.  No significant extra- axial fluid collection is present.  The paranasal sinuses and mastoid air cells are clear.  The osseous skull is intact.  IMPRESSION: Negative CT of the head.  CT CERVICAL SPINE  Findings: The cervical spine is imaged from the skull base through T3-4.  There is slight rightward curvature of the cervical spine. The vertebral body heights and alignment maintained.  The chronic end plate degenerative changes are evident with loss of disc height from C3-4 through C6-7.  Osseous foraminal narrowing is most pronounced at C5-6.  No acute fracture or traumatic subluxation is present.  The soft tissues of the neck are unremarkable.  The lung apices are clear.  IMPRESSION:  1.  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 2.  No acute fracture or traumatic subluxation.  Original Report Authenticated By: Jamesetta Orleans. MATTERN, M.D.     1. Cervical strain   2. Burn of hand       MDM  Pt presenting after rollover of riding lawnmower.  Pt with burns to 2  fingers, no other radiologic abnormalities.  She was intiially very anxious, but cooperative and reassured after evaluation.  Pt ambulated in ED without difficulty, no change in her chronic back pain.  Discharged with strict return precautions.  Pt agreeable with plan.        Ethelda Chick, MD 05/14/12 [phone]

## 2012-05-13 NOTE — ED Provider Notes (Signed)
MSE was initiated and I personally evaluated the patient and placed orders (if any) at  2:56 PM on May 13, 2012.  The remainder of the MSE may be completed by another ED provider.  Patient presents after riding mower injury. She was mowing and the grass was wet. The riding mower apparently tipped over on top of her. She was able to get out from underneath on her own. Patient is very upset and shaky. She was immobilized with a c-collar and on a long spine board. On assessment, she has mild lower cervical midline tenderness. She has no midline tenderness to palpation over the thoracic or lumbar spine. No chest or abd pain. Long spine board was cleared. C-collar was left in place. Pain medication was ordered. Pt to be evaluated further by oncoming MD/PA team at 3:00 PM.  Grant Fontana, PA-C 05/13/12 1458  Grant Fontana, PA-C 05/13/12 1523

## 2012-05-13 NOTE — ED Notes (Signed)
Patient transported to CT 

## 2012-05-13 NOTE — ED Notes (Signed)
MD at bedside. Dr. Lestine Box

## 2012-06-07 ENCOUNTER — Encounter: Payer: Self-pay | Admitting: Physical Medicine & Rehabilitation

## 2012-06-07 ENCOUNTER — Encounter: Payer: 59 | Attending: Physical Medicine & Rehabilitation | Admitting: Physical Medicine & Rehabilitation

## 2012-06-07 VITALS — BP 132/73 | HR 91 | Resp 14 | Ht 61.0 in | Wt 163.0 lb

## 2012-06-07 DIAGNOSIS — X58XXXA Exposure to other specified factors, initial encounter: Secondary | ICD-10-CM | POA: Insufficient documentation

## 2012-06-07 DIAGNOSIS — M47816 Spondylosis without myelopathy or radiculopathy, lumbar region: Secondary | ICD-10-CM

## 2012-06-07 DIAGNOSIS — F341 Dysthymic disorder: Secondary | ICD-10-CM | POA: Insufficient documentation

## 2012-06-07 DIAGNOSIS — M545 Low back pain, unspecified: Secondary | ICD-10-CM | POA: Insufficient documentation

## 2012-06-07 DIAGNOSIS — S838X9A Sprain of other specified parts of unspecified knee, initial encounter: Secondary | ICD-10-CM

## 2012-06-07 DIAGNOSIS — I10 Essential (primary) hypertension: Secondary | ICD-10-CM | POA: Insufficient documentation

## 2012-06-07 DIAGNOSIS — S8990XA Unspecified injury of unspecified lower leg, initial encounter: Secondary | ICD-10-CM | POA: Insufficient documentation

## 2012-06-07 DIAGNOSIS — M51379 Other intervertebral disc degeneration, lumbosacral region without mention of lumbar back pain or lower extremity pain: Secondary | ICD-10-CM | POA: Insufficient documentation

## 2012-06-07 DIAGNOSIS — S99919A Unspecified injury of unspecified ankle, initial encounter: Secondary | ICD-10-CM

## 2012-06-07 DIAGNOSIS — Q762 Congenital spondylolisthesis: Secondary | ICD-10-CM | POA: Insufficient documentation

## 2012-06-07 DIAGNOSIS — M47817 Spondylosis without myelopathy or radiculopathy, lumbosacral region: Secondary | ICD-10-CM | POA: Insufficient documentation

## 2012-06-07 DIAGNOSIS — K219 Gastro-esophageal reflux disease without esophagitis: Secondary | ICD-10-CM | POA: Insufficient documentation

## 2012-06-07 DIAGNOSIS — F0781 Postconcussional syndrome: Secondary | ICD-10-CM | POA: Insufficient documentation

## 2012-06-07 DIAGNOSIS — M1990 Unspecified osteoarthritis, unspecified site: Secondary | ICD-10-CM

## 2012-06-07 DIAGNOSIS — M5137 Other intervertebral disc degeneration, lumbosacral region: Secondary | ICD-10-CM | POA: Insufficient documentation

## 2012-06-07 MED ORDER — HYDROCODONE-ACETAMINOPHEN 10-325 MG PO TABS: 1.0000 | ORAL_TABLET | Freq: Four times a day (QID) | ORAL | Status: DC | PRN

## 2012-06-07 MED ORDER — ALPRAZOLAM 0.5 MG PO TABS: 0.5000 mg | ORAL_TABLET | Freq: Three times a day (TID) | ORAL | Status: DC | PRN

## 2012-06-07 NOTE — Progress Notes (Signed)
Subjective:    Patient ID: [name], female    DOB: 12-05-1955, 56 y.o.   MRN: [id number]  HPI  Shamicka is back regarding her low back pain. On 8/2 she was on her riding lawn mower when the mower slid and rolled over on her. She was brought in to the ED for a work up. CT of the head was negative. I reviewed lumbar spine xrays which showed disc disease at the L4-5 level and spondylolisthesis. She might have sustained a mild concussion as well.   As a result her back pain has increased. She has also had headaches, dizziness, balance problems, with  increased depression and anxiety noted as well. She is quite depressed because of her decreased pain and loss of function.  Pain Inventory Average Pain 8 Pain Right Now 8 My pain is sharp, burning, dull, stabbing, tingling and aching  In the last 24 hours, has pain interfered with the following? General activity 9 Relation with others 10 Enjoyment of life 10 What TIME of day is your pain at its worst? varies Sleep (in general) Poor  Pain is worse with: walking, sitting, standing and some activites Pain improves with: pacing activities and injections Relief from Meds: 7  Mobility walk with assistance use a cane how many minutes can you walk? 30 ability to climb steps?  yes do you drive?  yes  Function disabled: date disabled 11/2008 I need assistance with the following:  dressing, meal prep, household duties and shopping  Neuro/Psych weakness numbness tremor tingling trouble walking spasms confusion depression anxiety  Prior Studies x-rays CT/MRI  Physicians involved in your care Any changes since last visit?  no   Family History  Problem Relation Age of Onset  . Cancer Mother     cervical  . Cancer Father     colon   History   Social History  . Marital Status: Married    Spouse Name: N/A    Number of Children: N/A  . Years of Education: N/A   Social History Main Topics  . Smoking status: Never Smoker    . Smokeless tobacco: Never Used  . Alcohol Use: None  . Drug Use: None  . Sexually Active: None   Other Topics Concern  . None   Social History Narrative  . None   Past Surgical History  Procedure Date  . Abdominal hysterectomy 1999  . Tonsillectomy 1981  . Tubal ligation 1997  . Bunions removed 1988  . Edg 12/17/2004  . Electrocardiogram 05/27/2007   Past Medical History  Diagnosis Date  . ANXIETY 06/10/2007  . ASYMPTOMATIC POSTMENOPAUSAL STATUS 07/19/2009  . Cough 04/02/2009  . DYSLIPIDEMIA 06/28/2008  . GERD 06/10/2007  . 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 HYPERGLYCEMIA 06/28/2008  . HYPERTENSION 06/10/2007  . Irritable bowel syndrome 06/28/2008  . OSTEOARTHRITIS 06/10/2007  . UPPER RESPIRATORY INFECTION 03/26/2009  . WEIGHT GAIN 01/21/2010   BP 132/73  Pulse 91  Resp 14  Ht 5\' 1"  (1.549 m)  Wt 163 lb (73.936 kg)  BMI 30.80 kg/m2  SpO2 98%     Review of Systems  Musculoskeletal: Positive for myalgias, back pain, joint swelling, arthralgias and gait problem.  Neurological: Positive for weakness and numbness.  Psychiatric/Behavioral: Positive for confusion and dysphoric mood. The patient is nervous/anxious.   All other systems reviewed and are negative.       Objective:   Physical Exam  Constitutional: She is oriented to person, place, and time. She appears well-developed and well-nourished.  HENT:  Head: Normocephalic and atraumatic.  Eyes: Conjunctivae and EOM are normal. Pupils are equal, round, and reactive to light.  Neck: Normal range of motion.  Cardiovascular: Normal rate.  Pulmonary/Chest: Effort normal and breath sounds normal.  Abdominal: Soft. Bowel sounds are normal.  Musculoskeletal: Normal range of motion.  Back is very tender with extension greater than flexion especially over the L4-S1 levels centrally. Minimal radiation of the pain on exam. Both legs are 5/5 with normal sensation and reflexes. She had difficulty standing in full extension. Neurological: She is  alert and oriented to person, place, and time but was more distractible today and had some difficulty with attention. She had 2 beats of nystagmus with vertical gaze. Romberg test was positive. She had difficulty with heel to toe walking, frequently losing her balance. She required a cane for balance in normal gait. Skin: Skin is warm.  Psychiatric: She has a normal mood and affect. Her behavior is normal. Judgment and thought content normal.    Assessment & Plan:   ASSESSMENT:  1. 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  2. Left knee meniscus injury.  3. Depression with anxiety.  4. Myofascial pain.  5. Concussion on 8/2 with post concussion syndrome  PLAN:  1. MRI of lumbar spine to assess her L4-L5 space which is very suspect on Xray. 2. Discussed mgt of her PCS. It was recommended that she get adequate sleep, regular diet, cane for balance, etc. I told her that most of her symptoms should improve, but if they dont' we could explore therapy to address balance and vestibular sx. 3. Refilled hydrocodone today as well as xanax 4. I'll see her back in about one month.

## 2012-06-07 NOTE — Patient Instructions (Signed)
Post-Concussion Syndrome  Post-concussion syndrome describes the symptoms that can occur after a head injury. These symptoms can last from weeks to months.  CAUSES   It is not clear why some head injuries cause post-concussion syndrome. It can occur whether your head injury was mild or severe and whether you were wearing head protection or not.   SYMPTOMS   Memory difficulties.   Dizziness.   Headaches.   Double vision or blurry vision.   Sensitivity to light.   Hearing difficulties.   Depression.   Tiredness.   Weakness.   Difficulty with concentration.   Difficulty sleeping or staying asleep.   Vomiting.  DIAGNOSIS   There is no test to determine whether you have post-concussion syndrome. Your caregiver may order an imaging scan of your brain, such as a CT scan, to check for other problems that may be causing your symptoms (such as severe injury inside your skull).  TREATMENT   Usually, these problems disappear over time without medical care. Your caregiver may prescribe medicine to help ease your symptoms. It is important to follow up with a neurologist to evaluate your recovery and address any lingering symptoms or issues.  HOME CARE INSTRUCTIONS    Only take over-the-counter or prescription medicines for pain, discomfort, or fever as directed by your caregiver. Do not take aspirin. Aspirin can slow blood clotting.   Sleep with your head slightly elevated to help with headaches.   Avoid any situation where there is potential for another head injury (football, hockey, martial arts, horseback riding). Your condition will get worse every time you experience a concussion. You should avoid these activities until you are evaluated by the appropriate follow-up caregivers.   Keep all follow-up appointments as directed by your caregiver.  SEEK IMMEDIATE MEDICAL CARE IF:   You develop confusion or unusual drowsiness.   You cannot wake the injured person.   You develop nausea or persistent, forceful  vomiting.   You feel like you are moving when you are not (vertigo).   You notice the injured person's eyes moving rapidly back and forth. This may be a sign of vertigo.   You have convulsions or faint.   You have severe, persistent headaches that are not relieved by medicine.   You cannot use your arms or legs normally.   Your pupils change size.   You have clear or bloody discharge from the nose or ears.   Your problems are getting worse, not better.  MAKE SURE YOU:   Understand these instructions.   Will watch your condition.   Will get help right away if you are not doing well or get worse.  Document Released: 03/20/2002 Document Revised: 09/17/2011 Document Reviewed: 04/16/2011  [employer] Patient Information 2012 [employer].

## 2012-06-24 ENCOUNTER — Other Ambulatory Visit: Payer: Self-pay | Admitting: Endocrinology

## 2012-07-02 ENCOUNTER — Ambulatory Visit
Admission: RE | Admit: 2012-07-02 | Discharge: 2012-07-02 | Disposition: A | Discharge status: Home / Self Care | Payer: 59 | Source: Ambulatory Visit | Attending: Physical Medicine & Rehabilitation | Admitting: Physical Medicine & Rehabilitation

## 2012-07-02 DIAGNOSIS — M47816 Spondylosis without myelopathy or radiculopathy, lumbar region: Secondary | ICD-10-CM

## 2012-07-02 DIAGNOSIS — M47817 Spondylosis without myelopathy or radiculopathy, lumbosacral region: Secondary | ICD-10-CM

## 2012-07-04 ENCOUNTER — Telehealth: Payer: Self-pay

## 2012-07-04 NOTE — Telephone Encounter (Signed)
Returning Dr [name] call.  Patient has appointment and will discuss MRI results then.

## 2012-07-05 ENCOUNTER — Other Ambulatory Visit: Payer: 59

## 2012-07-05 ENCOUNTER — Encounter: Payer: Self-pay | Admitting: Physical Medicine & Rehabilitation

## 2012-07-05 ENCOUNTER — Encounter: Payer: 59 | Attending: Physical Medicine & Rehabilitation | Admitting: Physical Medicine & Rehabilitation

## 2012-07-05 VITALS — BP 139/74 | HR 100 | Resp 16 | Ht 61.0 in | Wt 164.0 lb

## 2012-07-05 DIAGNOSIS — M1990 Unspecified osteoarthritis, unspecified site: Secondary | ICD-10-CM

## 2012-07-05 DIAGNOSIS — M47817 Spondylosis without myelopathy or radiculopathy, lumbosacral region: Secondary | ICD-10-CM

## 2012-07-05 DIAGNOSIS — S8990XA Unspecified injury of unspecified lower leg, initial encounter: Secondary | ICD-10-CM

## 2012-07-05 DIAGNOSIS — S838X9A Sprain of other specified parts of unspecified knee, initial encounter: Secondary | ICD-10-CM

## 2012-07-05 DIAGNOSIS — Z5181 Encounter for therapeutic drug level monitoring: Secondary | ICD-10-CM

## 2012-07-05 DIAGNOSIS — M47816 Spondylosis without myelopathy or radiculopathy, lumbar region: Secondary | ICD-10-CM

## 2012-07-05 DIAGNOSIS — B349 Viral infection, unspecified: Secondary | ICD-10-CM

## 2012-07-05 DIAGNOSIS — S99929A Unspecified injury of unspecified foot, initial encounter: Secondary | ICD-10-CM

## 2012-07-05 DIAGNOSIS — X58XXXA Exposure to other specified factors, initial encounter: Secondary | ICD-10-CM | POA: Insufficient documentation

## 2012-07-05 DIAGNOSIS — F0781 Postconcussional syndrome: Secondary | ICD-10-CM

## 2012-07-05 DIAGNOSIS — B9789 Other viral agents as the cause of diseases classified elsewhere: Secondary | ICD-10-CM

## 2012-07-05 MED ORDER — HYDROCODONE-ACETAMINOPHEN 10-325 MG PO TABS: 1.0000 | ORAL_TABLET | Freq: Four times a day (QID) | ORAL | Status: DC | PRN

## 2012-07-05 NOTE — Addendum Note (Signed)
Addended by: Doreene Eland on: 07/05/2012 02:12 PM   Modules accepted: Orders

## 2012-07-05 NOTE — Progress Notes (Signed)
Subjective:    Patient ID: [name], female    DOB: May 13, 1956, 56 y.o.   MRN: [id number]  HPI  Bela is back regarding her acute on chronic low back pain. She continues to have significant back pain. She does have some radiation at times into the left hip/leg and right hip. She states that her feet occasionally will go "numb" when she is sitting for a long period of time, but this tends to resolve when she changes positions. Her feet are never painful.   She still complains of dizziness, but it is improving. She has ongoing tinnitus as well which doesn't seem to be getting any better.   She is using hydrocodone for pain control  At last visit, i ordered an MRI of her spine which revealed:   L3-4: Minimal retrolisthesis L3. Bulge/shallow broad-based  protrusion minimally more notable to the right. Minimal to mild  facet joint degenerative changes.  L4-5: Disc degeneration with disc space narrowing and broad-based  disc osteophyte complex most notable right paracentral position.  Slight indentation of ventral aspect of the thecal sac. Mild facet  joint degenerative changes and mild ligamentum flavum hypertrophy  slightly greater.  L5-S1: Disc degeneration with baseline bulge with superimposed  moderate size left paracentral caudally extending extruded disc  with mass effect upon the ventral aspect of the thecal sac and  minimal impression upon the medial aspect of the origin of the left  S1 nerve root. Mild bilateral facet joint degenerative changes.     Pain Inventory Average Pain 9 Pain Right Now 9 My pain is sharp, burning, dull, stabbing, tingling and aching  In the last 24 hours, has pain interfered with the following? General activity 10 Relation with others 10 Enjoyment of life 10 What TIME of day is your pain at its worst? All Day Sleep (in general) Fair  Pain is worse with: unsure Pain improves with: rest, pacing activities and medication Relief from Meds:  7  Mobility use a cane  Function I need assistance with the following:  dressing, meal prep, household duties and shopping  Neuro/Psych weakness numbness tremor tingling trouble walking spasms dizziness confusion depression anxiety loss of taste or smell  Prior Studies Any changes since last visit?  no  Physicians involved in your care Any changes since last visit?  no   Family History  Problem Relation Age of Onset  . Cancer Mother     cervical  . Cancer Father     colon   History   Social History  . Marital Status: Married    Spouse Name: N/A    Number of Children: N/A  . Years of Education: N/A   Social History Main Topics  . Smoking status: Never Smoker   . Smokeless tobacco: Never Used  . Alcohol Use: None  . Drug Use: None  . Sexually Active: None   Other Topics Concern  . None   Social History Narrative  . None   Past Surgical History  Procedure Date  . Abdominal hysterectomy 1999  . Tonsillectomy 1981  . Tubal ligation 1997  . Bunions removed 1988  . Edg 12/17/2004  . Electrocardiogram 05/27/2007   Past Medical History  Diagnosis Date  . ANXIETY 06/10/2007  . ASYMPTOMATIC POSTMENOPAUSAL STATUS 07/19/2009  . Cough 04/02/2009  . DYSLIPIDEMIA 06/28/2008  . GERD 06/10/2007  . 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 HYPERGLYCEMIA 06/28/2008  . HYPERTENSION 06/10/2007  . Irritable bowel syndrome 06/28/2008  . OSTEOARTHRITIS 06/10/2007  . UPPER RESPIRATORY INFECTION  03/26/2009  . WEIGHT GAIN 01/21/2010   BP 139/74  Pulse 100  Resp 16  Ht 5\' 1"  (1.549 m)  Wt 164 lb (74.39 kg)  BMI 30.99 kg/m2  SpO2 96%      Review of Systems  Constitutional: Positive for diaphoresis, appetite change and unexpected weight change.  HENT: Negative.   Eyes: Negative.   Respiratory: Negative.   Cardiovascular: Negative.   Gastrointestinal: Positive for abdominal pain and diarrhea.  Genitourinary: Negative.   Musculoskeletal: Positive for myalgias and arthralgias.  Skin:  Negative.   Neurological: Positive for dizziness, tremors, weakness and numbness.  Psychiatric/Behavioral: Positive for confusion.       Objective:   Physical Exam Constitutional: She is oriented to person, place, and time. She appears well-developed and well-nourished.  HENT:  Head: Normocephalic and atraumatic.  Eyes: Conjunctivae and EOM are normal. Pupils are equal, round, and reactive to light.  Neck: Normal range of motion.  Cardiovascular: Normal rate.  Pulmonary/Chest: Effort normal and breath sounds normal.  Abdominal: Soft. Bowel sounds are normal.  Musculoskeletal: Normal range of motion.  Back is very tender with extension and flexion especially over the L4-S1 levels centrally. Minimal radiation of the pain on exam. Both legs are 5/5 with normal sensation and reflexes. She had difficulty standing in full extension. SLR was negative with hamstring tightness noted.  Neurological: She is alert and oriented to person, place, and time but was more distractible today and had some difficulty with attention. She no nystagmus with vertical gaze. Romberg test was improved. She had difficulty with heel to toe walking, frequently losing her balance. She required a cane for balance in normal gait.  Skin: Skin is warm.  Psychiatric: She has a normal mood and affect. Her behavior is normal. Judgment and thought content normal.   Assessment & Plan:   ASSESSMENT:  1. 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 Recent MRI notable for protruded disc at L4-5 and an extruded disc at L5-S1.  2. Left knee meniscus injury.  3. Depression with anxiety.  4. Myofascial pain.  5. Concussion on 8/2 with post concussion syndrome    PLAN:  1. Arranged a translaminar ESI at L5-S1 with Dr. Stann Mainland was her most impressive level on MRI. Ultimately I want her going to PT as well, once her pain levels are reduced. 2. Reviewed symptoms related to her PCS.  Continue with adequate sleep, appropriate diet, etc. Her sx seem to be improving. I hesitate adding anything for the tinnitus, as pharmacological mgt tends not to be of a lot of use. Consider vestibular rehab when i send her for back PT. 3. Refilled hydrocodone today  4. I'll see her back pending injections.        Assessment & Plan:

## 2012-07-05 NOTE — Patient Instructions (Addendum)
Continue to remain as mobile as you can. Gentle range of motion as tolerated would be helpful also

## 2012-07-08 ENCOUNTER — Other Ambulatory Visit: Payer: Self-pay | Admitting: Endocrinology

## 2012-07-28 ENCOUNTER — Ambulatory Visit: Payer: 59 | Admitting: Physical Medicine & Rehabilitation

## 2012-08-02 ENCOUNTER — Encounter: Payer: Self-pay | Admitting: Physical Medicine & Rehabilitation

## 2012-08-02 ENCOUNTER — Encounter: Payer: 59 | Attending: Physical Medicine & Rehabilitation

## 2012-08-02 ENCOUNTER — Ambulatory Visit: Payer: 59 | Admitting: Physical Medicine & Rehabilitation

## 2012-08-02 VITALS — BP 138/70 | HR 96 | Resp 14 | Ht 61.0 in | Wt 166.2 lb

## 2012-08-02 DIAGNOSIS — X58XXXA Exposure to other specified factors, initial encounter: Secondary | ICD-10-CM | POA: Insufficient documentation

## 2012-08-02 DIAGNOSIS — M1990 Unspecified osteoarthritis, unspecified site: Secondary | ICD-10-CM | POA: Insufficient documentation

## 2012-08-02 DIAGNOSIS — S8990XA Unspecified injury of unspecified lower leg, initial encounter: Secondary | ICD-10-CM | POA: Insufficient documentation

## 2012-08-02 DIAGNOSIS — IMO0002 Reserved for concepts with insufficient information to code with codable children: Secondary | ICD-10-CM

## 2012-08-02 DIAGNOSIS — F0781 Postconcussional syndrome: Secondary | ICD-10-CM | POA: Insufficient documentation

## 2012-08-02 DIAGNOSIS — S99929A Unspecified injury of unspecified foot, initial encounter: Secondary | ICD-10-CM | POA: Insufficient documentation

## 2012-08-02 DIAGNOSIS — M47817 Spondylosis without myelopathy or radiculopathy, lumbosacral region: Secondary | ICD-10-CM | POA: Insufficient documentation

## 2012-08-02 DIAGNOSIS — M5116 Intervertebral disc disorders with radiculopathy, lumbar region: Secondary | ICD-10-CM

## 2012-08-02 DIAGNOSIS — Z5181 Encounter for therapeutic drug level monitoring: Secondary | ICD-10-CM | POA: Insufficient documentation

## 2012-08-02 MED ORDER — HYDROCODONE-ACETAMINOPHEN 10-325 MG PO TABS: 1.0000 | ORAL_TABLET | Freq: Four times a day (QID) | ORAL | Status: DC | PRN

## 2012-08-02 NOTE — Progress Notes (Signed)
  PROCEDURE RECORD [facility]   Name: [name] DOB:1956/07/03 MRN: [id number]  Date:08/02/2012  Physician: Claudette Laws, MD    Nurse/CMA: Christia Reading  Allergies: No Known Allergies  Consent Signed: yes  Is patient diabetic? no  CBG today?   Pregnant: no LMP: No LMP recorded. Patient has had a hysterectomy. (age 55-55)  Anticoagulants: no Anti-inflammatory: no Antibiotics: no  Procedure:Translaminar Lumbar Epidural Steroid Injection  Position: Prone Start Time:  3:23 End Time: 3:27 Fluoro Time: 14 seconds  RN/CMA Designer, multimedia    Time 2:22 3:34    BP 138/70 133/59    Pulse 96 86    Respirations 14 14    O2 Sat 96 98    S/S 6   6    Pain Level 10/10 0/10     D/C home with [facility], patient A & O X 3, D/C instructions reviewed, and sits independently.

## 2012-08-02 NOTE — Progress Notes (Signed)
Lumbar epidural steroid injection under fluoroscopic guidance  Indication: Lumbosacral radiculitis is not relieved by medication management or other conservative care and interfering with self-care and mobility.  Informed consent was obtained after describing risk and benefits of the procedure with the patient, this includes bleeding, bruising, infection, paralysis and medication side effects.  The patient wishes to proceed and has given written consent.  Patient was placed in a prone position.  The lumbar area was marked and prepped with Betadine.  It was entered with a 25-gauge 1-1/2 inch needle and one mL of 1% lidocaine was injected into the skin and subcutaneous tissue.  Then a 17-gauge spinal needle was inserted under fluoroscopic guidance into the L5-S1 interlaminar space under AP and Lateral imaging.  Once needle tip of approximated the posterior elements, a loss of resistance technique was utilized with lateral imaging.  A positive loss of resistance was obtained and then confirmed by injecting 2 mL's of Omnipaque 180.  Then a solution containing 2 mL's of 40 mg per mL depomedrol and 2 mL's of 1% lidocaine was injected.  The patient tolerated procedure well.  Post procedure instructions were given.  Please se post procedure form.

## 2012-08-02 NOTE — Patient Instructions (Signed)
Epidural Steroid Injection Care After  Refer to this sheet in the next few weeks. These instructions provide you with information on caring for yourself after your procedure. Your caregiver may also give you more specific instructions. Your treatment has been planned according to current medical practices, but problems sometimes occur. Call your caregiver if you have any problems or questions after your procedure. HOME CARE INSTRUCTIONS   Avoid the use of heat on the injection site for a day.  Do not have a tub bath or soak in water for the rest of the day.  Remove the bandage on the next day.  Resume your normal activities on the next day.  Use ice packs or mild pain relievers to reduce the soreness around the injection site.  Follow up with your caregiver 7 to 10 days after the procedure. SEEK MEDICAL CARE IF:   You develop a fever of more than 100.5 F (38.1 C).  You continue to have pain and soreness over the injection site even after taking medicines.  You develop significant nausea or vomiting. SEEK IMMEDIATE MEDICAL CARE IF:   You have severe back pain, which is not relieved by medicines.  You develop severe headache, stiff neck, or sensitivity to light.  You develop any new numbness or weakness of your legs.  You lose control over your bladder or bowel movements.  You develop a fever of more than 102 F (38.9 C).  You develop difficulty breathing. Document Released: 01/13/2011 Document Revised: 12/21/2011 Document Reviewed: 01/13/2011 Exit[employer] Patient Information 2013 [employer].  

## 2012-08-10 ENCOUNTER — Ambulatory Visit (INDEPENDENT_AMBULATORY_CARE_PROVIDER_SITE_OTHER)
Admission: RE | Admit: 2012-08-10 | Discharge: 2012-08-10 | Disposition: A | Discharge status: Home / Self Care | Payer: 59 | Source: Ambulatory Visit

## 2012-08-10 ENCOUNTER — Other Ambulatory Visit (INDEPENDENT_AMBULATORY_CARE_PROVIDER_SITE_OTHER): Payer: 59

## 2012-08-10 ENCOUNTER — Telehealth: Payer: Self-pay | Admitting: *Deleted

## 2012-08-10 DIAGNOSIS — Z780 Asymptomatic menopausal state: Secondary | ICD-10-CM

## 2012-08-10 DIAGNOSIS — Z0000 Encounter for general adult medical examination without abnormal findings: Secondary | ICD-10-CM

## 2012-08-10 DIAGNOSIS — E789 Disorder of lipoprotein metabolism, unspecified: Secondary | ICD-10-CM

## 2012-08-10 LAB — HEPATIC FUNCTION PANEL
Alkaline Phosphatase: 54 U/L (ref 39–117)
Bilirubin, Direct: 0.1 mg/dL (ref 0.0–0.3)
Total Bilirubin: 0.5 mg/dL (ref 0.3–1.2)

## 2012-08-10 LAB — BASIC METABOLIC PANEL
BUN: 15 mg/dL (ref 6–23)
CO2: 31 mEq/L (ref 19–32)
Chloride: 100 mEq/L (ref 96–112)
Creatinine, Ser: 0.8 mg/dL (ref 0.4–1.2)
Glucose, Bld: 78 mg/dL (ref 70–99)
Potassium: 5.2 mEq/L — ABNORMAL HIGH (ref 3.5–5.1)

## 2012-08-10 LAB — URINALYSIS, ROUTINE W REFLEX MICROSCOPIC
Ketones, ur: NEGATIVE
Specific Gravity, Urine: 1.025 (ref 1.000–1.030)
Urine Glucose: NEGATIVE
Urobilinogen, UA: 0.2 (ref 0.0–1.0)
pH: 6 (ref 5.0–8.0)

## 2012-08-10 LAB — CBC WITH DIFFERENTIAL/PLATELET
Basophils Relative: 0.7 % (ref 0.0–3.0)
Eosinophils Absolute: 0.1 10*3/uL (ref 0.0–0.7)
Eosinophils Relative: 1.1 % (ref 0.0–5.0)
Hemoglobin: 13.2 g/dL (ref 12.0–15.0)
Lymphocytes Relative: 31.1 % (ref 12.0–46.0)
MCHC: 33.2 g/dL (ref 30.0–36.0)
Neutro Abs: 3.7 10*3/uL (ref 1.4–7.7)
RBC: 4.63 Mil/uL (ref 3.87–5.11)
WBC: 6.2 10*3/uL (ref 4.5–10.5)

## 2012-08-10 LAB — LIPID PANEL
HDL: 58.8 mg/dL (ref >39.00)
Total CHOL/HDL Ratio: 4
VLDL: 69.6 mg/dL — ABNORMAL HIGH (ref 0.0–40.0)

## 2012-08-10 NOTE — Telephone Encounter (Signed)
[facility] called requesting lab orders for pt. Pt has an appt on 08/17/12. They drew for CBC, Chem 7, Urine. Will you please place orders and notify if there are any other tests needed?

## 2012-08-10 NOTE — Telephone Encounter (Signed)
Orders placed to lab: CBC w/diff, hepatic panel, BMET. TSH, lipids, UA with micro.

## 2012-08-10 NOTE — Telephone Encounter (Signed)
Please add rest of cpx labs, and a1c

## 2012-08-11 LAB — TSH: TSH: 2.63 u[IU]/mL (ref 0.35–5.50)

## 2012-08-16 NOTE — Progress Notes (Signed)
LMOM FOR PT, PT TO CALL IF ANY QUESTIONS

## 2012-08-17 ENCOUNTER — Encounter: Payer: Self-pay | Admitting: Endocrinology

## 2012-08-17 ENCOUNTER — Ambulatory Visit (INDEPENDENT_AMBULATORY_CARE_PROVIDER_SITE_OTHER): Payer: 59 | Admitting: Endocrinology

## 2012-08-17 VITALS — BP 122/80 | HR 100 | Temp 98.1°F | Wt 166.0 lb

## 2012-08-17 DIAGNOSIS — K219 Gastro-esophageal reflux disease without esophagitis: Secondary | ICD-10-CM

## 2012-08-17 DIAGNOSIS — F329 Major depressive disorder, single episode, unspecified: Secondary | ICD-10-CM | POA: Insufficient documentation

## 2012-08-17 DIAGNOSIS — E785 Hyperlipidemia, unspecified: Secondary | ICD-10-CM

## 2012-08-17 DIAGNOSIS — F3289 Other specified depressive episodes: Secondary | ICD-10-CM

## 2012-08-17 MED ORDER — ATORVASTATIN CALCIUM 40 MG PO TABS: 40.0000 mg | ORAL_TABLET | Freq: Every day | ORAL | Status: DC

## 2012-08-17 MED ORDER — OMEPRAZOLE 40 MG PO CPDR
40.0000 mg | DELAYED_RELEASE_CAPSULE | Freq: Every day | ORAL | Status: DC
Start: 2012-08-17 — End: 2013-08-28

## 2012-08-17 MED ORDER — HYDROCHLOROTHIAZIDE 25 MG PO TABS: 25.0000 mg | ORAL_TABLET | Freq: Every day | ORAL | Status: DC

## 2012-08-17 NOTE — Progress Notes (Signed)
Subjective:    Patient ID: [name], female    DOB: 11-08-1955, 57 y.o.   MRN: [id number]  HPI here for regular wellness examination.  He's feeling pretty well in general, and says chronic med probs are stable, except as noted below.   Past Medical History  Diagnosis Date  . ANXIETY 06/10/2007  . ASYMPTOMATIC POSTMENOPAUSAL STATUS 07/19/2009  . Cough 04/02/2009  . DYSLIPIDEMIA 06/28/2008  . GERD 06/10/2007  . 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 HYPERGLYCEMIA 06/28/2008  . HYPERTENSION 06/10/2007  . Irritable bowel syndrome 06/28/2008  . OSTEOARTHRITIS 06/10/2007  . UPPER RESPIRATORY INFECTION 03/26/2009  . WEIGHT GAIN 01/21/2010    Past Surgical History  Procedure Date  . Abdominal hysterectomy 1999  . Tonsillectomy 1981  . Tubal ligation 1997  . Bunions removed 1988  . Edg 12/17/2004  . Electrocardiogram 05/27/2007    History   Social History  . Marital Status: Married    Spouse Name: N/A    Number of Children: N/A  . Years of Education: N/A   Occupational History  . Not on file.   Social History Main Topics  . Smoking status: Never Smoker   . Smokeless tobacco: Never Used  . Alcohol Use: Not on file  . Drug Use: Not on file  . Sexually Active: Not on file   Other Topics Concern  . Not on file   Social History Narrative  . No narrative on file    Current Outpatient Prescriptions on File Prior to Visit  Medication Sig Dispense Refill  . acyclovir (ZOVIRAX) 800 MG tablet Take 800 mg by mouth daily.      Marland Kitchen ALPRAZolam (XANAX) 0.5 MG tablet Take 1 tablet (0.5 mg total) by mouth 3 (three) times daily as needed. For anxiety  90 tablet  3  . cyclobenzaprine (FLEXERIL) 5 MG tablet       . HYDROcodone-acetaminophen (NORCO) 10-325 MG per tablet Take 1 tablet by mouth every 6 (six) hours as needed. For pain  120 tablet  0  . Multiple Vitamin (MULTIVITAMIN WITH MINERALS) TABS Take 1 tablet by mouth daily.      Marland Kitchen RASPBERRY KETONES PO Take 2 capsules by mouth 2 (two) times daily.      .  Venlafaxine HCl 225 MG TB24 Take 225 mg by mouth daily.      Marland Kitchen atorvastatin (LIPITOR) 40 MG tablet Take 1 tablet (40 mg total) by mouth daily.  30 tablet  11  . hydrochlorothiazide (HYDRODIURIL) 25 MG tablet Take 1 tablet (25 mg total) by mouth daily.  90 tablet  3  . omeprazole (PRILOSEC) 40 MG capsule Take 1 capsule (40 mg total) by mouth daily.  30 capsule  11    No Known Allergies  Family History  Problem Relation Age of Onset  . Cancer Mother     cervical  . Cancer Father     colon    BP 122/80  Pulse 100  Temp 98.1 F (36.7 C) (Oral)  Wt 166 lb (75.297 kg)  SpO2 94%    Review of Systems  Constitutional: Negative for fever and unexpected weight change.  HENT: Negative for hearing loss.   Eyes: Negative for visual disturbance.  Respiratory: Negative for shortness of breath.   Cardiovascular: Negative for chest pain.  Gastrointestinal: Negative for anal bleeding.  Genitourinary: Negative for hematuria.  Musculoskeletal: Positive for back pain.  Skin: Negative for rash.  Neurological: Negative for syncope and numbness.  Hematological: Bruises/bleeds easily.  Psychiatric/Behavioral: The patient is nervous/anxious.  Objective:   Physical Exam VS: see vs page GEN: no distress HEAD: head: no deformity eyes: no periorbital swelling, no proptosis external nose and ears are normal mouth: no lesion seen NECK: supple, thyroid is not enlarged CHEST WALL: no deformity LUNGS:  Clear to auscultation BREASTS:  sees gyn CV: reg rate and rhythm, no murmur ABD: abdomen is soft, nontender.  no hepatosplenomegaly.  not distended.  no hernia GENITALIA/RECTAL: sees gyn MUSCULOSKELETAL: muscle bulk and strength are grossly normal.  no obvious joint swelling.  gait is normal and steady EXTEMITIES: no deformity.  no ulcer on the feet.  feet are of normal color and temp.  no edema PULSES: dorsalis pedis intact bilat.  no carotid bruit NEURO:  cn 2-12 grossly intact.    readily moves all 4's.  sensation is intact to touch on the feet SKIN:  Normal texture and temperature.  No rash or suspicious lesion is visible.   NODES:  None palpable at the neck PSYCH: alert, oriented x3.  Does not appear anxious nor depressed.      Assessment & Plan:  Wellness visit today, with problems stable, except as noted.   SEPARATE EVALUATION FOLLOWS--EACH PROBLEM HERE IS NEW, NOT RESPONDING TO TREATMENT, OR POSES SIGNIFICANT RISK TO THE PATIENT'S HEALTH:  HISTORY OF THE PRESENT ILLNESS: Pt states 2 mos of slight heartburn sensation in the mouth, but no assoc dysphagia.   depression is worse. PAST MEDICAL HISTORY reviewed and up to date today REVIEW OF SYSTEMS: She also has back and neck pain, since recent lawnmower accident.   She has tinnitus since the accident.   PHYSICAL EXAMINATION: VITAL SIGNS:  See vs page GENERAL: no distress Both eac's and tm's are normal Spine: nontender LABS: Lab Results  Component Value Date   WBC 6.2 08/10/2012   HGB 13.2 08/10/2012   HCT 39.7 08/10/2012   PLT 216.0 08/10/2012   GLUCOSE 78 08/10/2012   CHOL 234* 08/10/2012   TRIG 348.0* 08/10/2012   HDL 58.80 08/10/2012   LDLDIRECT 133.1 08/10/2012   LDLCALC 96 08/15/2009   ALT 23 08/10/2012   AST 23 08/10/2012   NA 139 08/10/2012   K 5.2* 08/10/2012   CL 100 08/10/2012   CREATININE 0.8 08/10/2012   BUN 15 08/10/2012   CO2 31 08/10/2012   TSH 2.63 08/10/2012   INR 1.0 04/20/2008   HGBA1C 5.6 08/10/2012  IMPRESSION: Gerd, new, no alarm sxs Low-back pain, persistent Tinnitus, new, uncertain etiology hyperkalemia, prob due to acei Dyslipidemia, needs increased rx PLAN: See instruction page

## 2012-08-17 NOTE — Patient Instructions (Addendum)
Refer to a psychiatry specialist.  you will receive a phone call, about a day and time for an appointment i have sent a prescription to your pharmacy, for a pill against the heartburn.  Please change lisinopril-hctz to the hctz only.  i have sent a prescription to your pharmacy.  Change pravastatin to lipitor.  i have sent a prescription to your pharmacy.  please consider these measures for your health:  minimize alcohol.  do not use tobacco products.  have a colonoscopy at least every 10 years from age 55.  Women should have an annual mammogram from age 79.  keep firearms safely stored.  always use seat belts.  have working smoke alarms in your home.  see an eye doctor and dentist regularly.  never drive under the influence of alcohol or drugs (including prescription drugs).  those with fair skin should take precautions against the sun. Please return in 1 year.

## 2012-08-18 ENCOUNTER — Encounter: Payer: Self-pay | Admitting: Endocrinology

## 2012-08-23 ENCOUNTER — Encounter: Payer: Self-pay | Admitting: Endocrinology

## 2012-08-24 ENCOUNTER — Other Ambulatory Visit: Payer: Self-pay | Admitting: Endocrinology

## 2012-08-24 DIAGNOSIS — Z1231 Encounter for screening mammogram for malignant neoplasm of breast: Secondary | ICD-10-CM

## 2012-09-02 ENCOUNTER — Encounter: Payer: 59 | Attending: Physical Medicine & Rehabilitation | Admitting: Physical Medicine & Rehabilitation

## 2012-09-02 ENCOUNTER — Encounter: Payer: Self-pay | Admitting: Physical Medicine & Rehabilitation

## 2012-09-02 VITALS — BP 156/86 | HR 92 | Resp 18 | Ht 61.0 in | Wt 171.0 lb

## 2012-09-02 DIAGNOSIS — X58XXXA Exposure to other specified factors, initial encounter: Secondary | ICD-10-CM | POA: Insufficient documentation

## 2012-09-02 DIAGNOSIS — M47817 Spondylosis without myelopathy or radiculopathy, lumbosacral region: Secondary | ICD-10-CM

## 2012-09-02 DIAGNOSIS — F0781 Postconcussional syndrome: Secondary | ICD-10-CM

## 2012-09-02 DIAGNOSIS — M47816 Spondylosis without myelopathy or radiculopathy, lumbar region: Secondary | ICD-10-CM

## 2012-09-02 DIAGNOSIS — M1990 Unspecified osteoarthritis, unspecified site: Secondary | ICD-10-CM

## 2012-09-02 MED ORDER — OXYCODONE HCL 10 MG PO TABS: 10.0000 mg | ORAL_TABLET | Freq: Four times a day (QID) | ORAL | Status: DC | PRN

## 2012-09-02 MED ORDER — MECLIZINE HCL 12.5 MG PO TABS: 12.5000 mg | ORAL_TABLET | Freq: Three times a day (TID) | ORAL | Status: DC | PRN

## 2012-09-02 NOTE — Patient Instructions (Signed)
CALL ME WITH QUESTIONS 

## 2012-09-02 NOTE — Progress Notes (Signed)
Subjective:    Patient ID: [name], female    DOB: 1955/10/26, 56 y.o.   MRN: [id number]  HPI  [name] is back regarding her back pain and concussion. She states that the injection last month gave her relief for about 24 hours (the pain was gone), then the pain proceeded to come back like it was before. Her pain is predominantly in the low back but she does have radiation down the left leg. She describes it as tingling, stabbing.   She continues to complain of dizziness, especially when she changes positions. She hasn't noticed much improvement with this since i last saw her.   She doesn't feel that the hydrocodone is effective for her pain any more. Her husband is with her today and is concerned about her pain levels.   Pain Inventory Average Pain 9 Pain Right Now 9 My pain is sharp, burning, dull, stabbing, tingling and aching  In the last 24 hours, has pain interfered with the following? General activity 10 Relation with others 10 Enjoyment of life 10 What TIME of day is your pain at its worst? all the time Sleep (in general) Poor  Pain is worse with: walking, bending, sitting, inactivity, standing, unsure and some activites Pain improves with: pacing activities and medication Relief from Meds: 9  Mobility walk with assistance use a cane ability to climb steps?  yes do you drive?  yes  Function not employed: date last employed 2010 I need assistance with the following:  meal prep, household duties and shopping  Neuro/Psych bladder control problems weakness numbness tremor tingling trouble walking spasms dizziness confusion depression anxiety  Prior Studies Any changes since last visit?  no  Physicians involved in your care Any changes since last visit?  no   Family History  Problem Relation Age of Onset  . Cancer Mother     cervical  . Cancer Father     colon   History   Social History  . Marital Status: Married    Spouse Name: [name]   Number of Children: [name]  . Years of Education: [name]   Social History Main Topics  . Smoking status: Never Smoker   . Smokeless tobacco: Never Used  . Alcohol Use: None  . Drug Use: None  . Sexually Active: None   Other Topics Concern  . None   Social History Narrative  . None   Past Surgical History  Procedure Date  . Abdominal hysterectomy 1999  . Tonsillectomy 1981  . Tubal ligation 1997  . Bunions removed 1988  . Edg 12/17/2004  . Electrocardiogram 05/27/2007   Past Medical History  Diagnosis Date  . ANXIETY 06/10/2007  . ASYMPTOMATIC POSTMENOPAUSAL STATUS 07/19/2009  . Cough 04/02/2009  . DYSLIPIDEMIA 06/28/2008  . GERD 06/10/2007  . 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 HYPERGLYCEMIA 06/28/2008  . HYPERTENSION 06/10/2007  . Irritable bowel syndrome 06/28/2008  . OSTEOARTHRITIS 06/10/2007  . UPPER RESPIRATORY INFECTION 03/26/2009  . WEIGHT GAIN 01/21/2010   BP 156/86  Pulse 92  Resp 18  Ht 5\' 1"  (1.549 m)  Wt 171 lb (77.565 kg)  BMI 32.31 kg/m2  SpO2 93%    Review of Systems  Constitutional: Positive for diaphoresis and unexpected weight change.  Respiratory: Positive for cough.   Musculoskeletal: Positive for myalgias, back pain, arthralgias and gait problem.  Neurological: Positive for dizziness, tremors, weakness and numbness.  Psychiatric/Behavioral: Positive for confusion and dysphoric mood. The patient is nervous/anxious.   All other systems reviewed and are negative.  Objective:   Physical Exam  Constitutional: She is oriented to person, place, and time. She appears well-developed and well-nourished.  HENT:  Head: Normocephalic and atraumatic.  Eyes: Conjunctivae and EOM are normal. Pupils are equal, round, and reactive to light.  Neck: Normal range of motion.  Cardiovascular: Normal rate.  Pulmonary/Chest: Effort normal and breath sounds normal.  Abdominal: Soft. Bowel sounds are normal.  Musculoskeletal: Normal range of motion.  Back is very tender with  extension and flexion especially over the L4-S1 levels centrally. Minimal radiation of the pain on exam. Both legs are 5/5 with normal sensation and reflexes. She had difficulty standing in full extension. SLR was negative with hamstring tightness noted in both legs, left greater than right.   Neurological: She is alert and oriented to person, place, and time but was more distractible today and had some difficulty with attention. She had 1-2 beats nystagmus with left lateral gaze.   She had difficulty with heel to toe walking, frequently losing her balance. She required a cane for balance in normal gait.  Skin: Skin is warm.  Psychiatric: She has a normal mood and affect, except for appearing a little down. Her behavior is normal. Judgment and thought content normal.  Assessment & Plan:    ASSESSMENT:  1. 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 Recent MRI notable for protruded disc at L4-5 and an extruded disc at L5-S1.  2. Left knee meniscus injury.  3. Depression with anxiety.  4. Myofascial pain.  5. Concussion on 8/2 with post concussion syndrome   PLAN:  1. Arranged a repeat translaminar ESI at L5-S1 with Dr. Stann Mainland was her most impressive level on MRI. I will make a referral to Dr. Marikay Alar, for NS opinion as well as her pain has been persistent and was only briefly responsive to the [facility]  2. Again reviewed symptoms related to her PCS. Continue with adequate sleep, appropriate diet, etc. Her sx are persistent. She is concerned about attending therapy for these due to cost consideration. I did write her an rx for meclizine. Will make the referral for vestibular rehab as soon as she is able to afford. 3. Will change her hydrocodone to oxycodone 10mg  to see if she gets better relief of back/leg pain. 4. I'll see her back pending injections. 30 minutes of face to face patient care time were spent during this visit. All questions were  encouraged and answered.

## 2012-09-06 ENCOUNTER — Other Ambulatory Visit: Payer: Self-pay | Admitting: Endocrinology

## 2012-09-06 ENCOUNTER — Other Ambulatory Visit: Payer: Self-pay | Admitting: *Deleted

## 2012-09-06 ENCOUNTER — Ambulatory Visit
Admission: RE | Admit: 2012-09-06 | Discharge: 2012-09-06 | Disposition: A | Discharge status: Home / Self Care | Payer: 59 | Source: Ambulatory Visit | Attending: Endocrinology | Admitting: Endocrinology

## 2012-09-06 DIAGNOSIS — Z1231 Encounter for screening mammogram for malignant neoplasm of breast: Secondary | ICD-10-CM

## 2012-09-07 ENCOUNTER — Ambulatory Visit: Payer: 59

## 2012-09-14 ENCOUNTER — Telehealth: Payer: Self-pay

## 2012-09-14 NOTE — Telephone Encounter (Signed)
Patient called regarding an appointment she has with Dr Yetta Barre that we referred her to.  She needs her MRI, xray and other medical records faxed to [facility] at [phone].  Please contact patient when this is done and if any questions.

## 2012-09-19 ENCOUNTER — Encounter: Payer: Self-pay | Admitting: Physical Medicine & Rehabilitation

## 2012-09-20 ENCOUNTER — Telehealth: Payer: Self-pay

## 2012-09-20 NOTE — Telephone Encounter (Signed)
Spoke with [name] regarding her message.  She was just following up on her medical records being sent to Dr Yetta Barre.  Per [facility] these records have been sent.  Patient will call Dr Yetta Barre to follow up.  She will then call our office to set up follow up appointment.

## 2012-09-20 NOTE — Telephone Encounter (Signed)
Left message on cell and at home for patient to call office.

## 2012-09-20 NOTE — Telephone Encounter (Signed)
Left message for patient to call office regarding her my chart message.... Good Evening Dr. [name], [facility] you and your family had a nice Thanksgiving holiday? I was in incredible pain. My hips are really giving me a fit! My reason for writing this note, is to see if you got the letter I wrote to you last week? I not only sent you this note via this website, but I also called the office and left a message. Before taking the time to rewrite the note, and to make sure I have all of the information entered correctly I wanted to see if you did in fact get the note. I followed the correct procedures; however, I have not received any type of reply from you or your office. I did call back today and finally got someone to talk to me one on one. This alone, took care of one of the problems; however, I did want to discuss additional problems with you. If you will reply and type in a simple YES or NO, I will take it from there! Thank you so much for your time and I will look forward to hearing back from you. Respectfully, Elayne Snare. [place]

## 2012-09-22 ENCOUNTER — Other Ambulatory Visit: Payer: Self-pay | Admitting: Endocrinology

## 2012-09-22 ENCOUNTER — Other Ambulatory Visit: Payer: Self-pay | Admitting: Physical Medicine & Rehabilitation

## 2012-09-23 ENCOUNTER — Encounter: Payer: Self-pay | Admitting: Physical Medicine & Rehabilitation

## 2012-09-23 ENCOUNTER — Telehealth: Payer: Self-pay | Admitting: *Deleted

## 2012-09-23 ENCOUNTER — Telehealth: Payer: Self-pay

## 2012-09-23 DIAGNOSIS — M1990 Unspecified osteoarthritis, unspecified site: Secondary | ICD-10-CM

## 2012-09-23 DIAGNOSIS — M47816 Spondylosis without myelopathy or radiculopathy, lumbar region: Secondary | ICD-10-CM

## 2012-09-23 DIAGNOSIS — M47817 Spondylosis without myelopathy or radiculopathy, lumbosacral region: Secondary | ICD-10-CM

## 2012-09-23 MED ORDER — OXYCODONE HCL 10 MG PO TABS: 10.0000 mg | ORAL_TABLET | Freq: Four times a day (QID) | ORAL | Status: DC | PRN

## 2012-09-23 NOTE — Telephone Encounter (Signed)
Patient called requesting her rx early.  She has been taking more than directed because she read the package insert and it told her to take when she has pain.  So she is out early.  New script given to her today.  She last got this filled 11/22.  According to pharmacy she can not get this filled until 12/19.  Advised patient to have pharmacy call next week and we could approve this time only.

## 2012-09-23 NOTE — Telephone Encounter (Signed)
Patient aware script is ready for pick up 

## 2012-09-23 NOTE — Telephone Encounter (Signed)
Please remind me to give her a buzz on Monday---thanks

## 2012-09-23 NOTE — Telephone Encounter (Signed)
[name] would like a refill of the following medications: Oxycodone HCl 10 MG TABS [SWARTZ,ZACHARY T, MD]  Preferred pharmacy: [id number] - , [place] - [address] Comment: Dear Dr. [name], I am writing to let you know that I have 2 days of Oxycodone left and will need a refill by the Fri night. I continue to have pain; however, it is less and I have also been able to sleep throughout the entire night. The pharmacy note reads: "Take 1 tablet by mouth every six hours as needed. Pain medications work best if they are used when the first signs of pain occur. If you wait until the pain has worsened, the medication may not work as well." I have found this works best.

## 2012-09-23 NOTE — Telephone Encounter (Signed)
Dr. [name], As you can see, I sent a message to you yesterday, in regards of needing a refill on the Oxycodone by the end of this weekend. I received a call from your office this moring at 11:52 a.m., I believe it was by [name], telling me that I had to come to your office to pick up my prescription. They told me to bring my Driver's License and said that I would need to sign for the prescription. So, I got ready, got dressed and drove all the way from my home to your office to pick up the prescription. I will tell you right now that [name] is the only person in there (besides April), that knows what the heck is going on. If it were not for those two, I would be bald today. [facility], only to be informed that I could not get it filled until 12/19. I stood there dumb-founded. I thought what the heck is going on! I drove all the way to your office and then to [facility] only to be told I could not get it. I called your office and unfortunately, [name] answered the phone. At first she acted very confused and then she decided to go with what the pharmacy was saying. This is a 33 mile round trip for me. I have exactly 13 pills remaining. It was my understanding that I was to try this medication out to see how it worked best for me. The first few days, yes, I took more than I probably should have, but I took what I needed to make the pain stop! We have already discussed where all the pain is that I am having, so I won't go into that again. I took 4 a day, but still had a lot of pain. But again, the only days I went over were the first 3 to 4 days I began taking it. If you will check previous messages, you will see where I was asking if I was taking too much, etc. I don't know if you read that, or not. [facility] said that [facility] would have to rewrite the prescription and change the dosage, if I needed more than what was said. So, please write me and tell me what I am to do at this point. I was  hoping to go home for the holidays, but it appears that I may not be able to at this point. I will anxiously await your reply. I do hope it is from you, and not from another staff member. Thank you for your time and assistance, [name]

## 2012-09-27 ENCOUNTER — Other Ambulatory Visit: Payer: Self-pay | Admitting: Physical Medicine & Rehabilitation

## 2012-09-27 ENCOUNTER — Encounter: Payer: Self-pay | Admitting: Endocrinology

## 2012-09-27 ENCOUNTER — Telehealth: Payer: Self-pay | Admitting: *Deleted

## 2012-09-27 MED ORDER — OXYCODONE HCL 10 MG PO TABS: 10.0000 mg | ORAL_TABLET | Freq: Every day | ORAL | Status: DC | PRN

## 2012-09-27 NOTE — Telephone Encounter (Signed)
Spoke to [facility]. Changed rx to read 5x daily prn. This will allow her to have it filled early although my expectation is tha she stays at 4 per day max

## 2012-09-27 NOTE — Telephone Encounter (Signed)
Patient is returning Dr. [name] call from this morning about her Oxycodone Rx.

## 2012-09-28 ENCOUNTER — Other Ambulatory Visit: Payer: Self-pay | Admitting: *Deleted

## 2012-09-28 MED ORDER — VENLAFAXINE HCL ER 225 MG PO TB24: 225.0000 mg | ORAL_TABLET | Freq: Every day | ORAL | Status: DC

## 2012-10-06 ENCOUNTER — Ambulatory Visit: Payer: 59

## 2012-10-08 ENCOUNTER — Other Ambulatory Visit: Payer: Self-pay | Admitting: Physical Medicine & Rehabilitation

## 2012-10-10 ENCOUNTER — Encounter: Payer: Self-pay | Admitting: Physical Medicine & Rehabilitation

## 2012-10-10 ENCOUNTER — Encounter: Payer: Self-pay | Admitting: Endocrinology

## 2012-10-18 ENCOUNTER — Other Ambulatory Visit: Payer: Self-pay | Admitting: Neurological Surgery

## 2012-10-27 ENCOUNTER — Encounter (HOSPITAL_COMMUNITY): Payer: Self-pay

## 2012-10-27 ENCOUNTER — Telehealth: Payer: Self-pay | Admitting: Physical Medicine & Rehabilitation

## 2012-10-27 MED ORDER — OXYCODONE HCL 10 MG PO TABS: 10.0000 mg | ORAL_TABLET | ORAL | Status: DC | PRN

## 2012-10-27 NOTE — Telephone Encounter (Signed)
Matrice Herro would like a refill of the following medications: Oxycodone HCl 10 MG TABS [SWARTZ,ZACHARY T, MD] Preferred pharmacy: [id number] - [place], [place] - [address] Comment: Hello! My prescription for the above runs out tomorrow 1/17. Do I need to come by and pick up the prescription? Or, do I need to call the Pharmacy and have them call you? Note: The last prescription came from [facility] [address]. Hopefully, [facility] have [facility] by now. I have an appt with Dr. [name] on the 1/21 at 10:20. Please reply to this note today, if possible, or call me at [phone]. Thank you very much

## 2012-10-27 NOTE — Telephone Encounter (Signed)
Prescription printed for Dr. Swartz to sign 

## 2012-10-28 ENCOUNTER — Encounter (HOSPITAL_COMMUNITY)
Admission: RE | Admit: 2012-10-28 | Discharge: 2012-10-28 | Disposition: A | Discharge status: Home / Self Care | Payer: 59 | Source: Ambulatory Visit | Attending: Neurological Surgery | Admitting: Neurological Surgery

## 2012-10-28 ENCOUNTER — Encounter (HOSPITAL_COMMUNITY): Payer: Self-pay

## 2012-10-28 LAB — CBC WITH DIFFERENTIAL/PLATELET
Basophils Absolute: 0 10*3/uL (ref 0.0–0.1)
Basophils Relative: 1 % (ref 0–1)
Eosinophils Absolute: 0.1 10*3/uL (ref 0.0–0.7)
Eosinophils Relative: 2 % (ref 0–5)
MCH: 27.7 pg (ref 26.0–34.0)
MCHC: 33.8 g/dL (ref 30.0–36.0)
MCV: 82 fL (ref 78.0–100.0)
Platelets: 226 10*3/uL (ref 150–400)
RDW: 12.7 % (ref 11.5–15.5)
WBC: 5.1 10*3/uL (ref 4.0–10.5)

## 2012-10-28 LAB — BASIC METABOLIC PANEL
CO2: 29 mEq/L (ref 19–32)
Calcium: 9.7 mg/dL (ref 8.4–10.5)
Creatinine, Ser: 0.81 mg/dL (ref 0.50–1.10)
Glucose, Bld: 89 mg/dL (ref 70–99)

## 2012-10-28 LAB — TYPE AND SCREEN

## 2012-10-28 LAB — SURGICAL PCR SCREEN
MRSA, PCR: NEGATIVE
Staphylococcus aureus: NEGATIVE

## 2012-10-28 LAB — ABO/RH: ABO/RH(D): O POS

## 2012-10-28 LAB — PROTIME-INR: INR: 0.95 (ref 0.00–1.49)

## 2012-10-28 NOTE — Telephone Encounter (Signed)
Left message that prescriptions are available for pick up.

## 2012-10-28 NOTE — Pre-Procedure Instructions (Signed)
[name]  10/28/2012   Your procedure is scheduled on:  11/03/12  Report to [facility] at 530 AM.  Call this number if you have problems the morning of surgery: [phone]   Remember:   Do not eat food or drink liquids after midnight.   Take these medicines the morning of surgery with A SIP OF WATER: zovirax,xanax,prilosec,oxycodone   Do not wear jewelry, make-up or nail polish.  Do not wear lotions, powders, or perfumes. You may wear deodorant.  Do not shave 48 hours prior to surgery. Men may shave face and neck.  Do not bring valuables to the hospital.  Contacts, dentures or bridgework may not be worn into surgery.  Leave suitcase in the car. After surgery it may be brought to your room.  For patients admitted to the hospital, checkout time is 11:00 AM the day of  discharge.   Patients discharged the day of surgery will not be allowed to drive  home.  Name and phone number of your driver: family  Special Instructions: Shower using CHG 2 nights before surgery and the night before surgery.  If you shower the day of surgery use CHG.  Use special wash - you have one bottle of CHG for all showers.  You should use approximately 1/3 of the bottle for each shower.   Please read over the following fact sheets that you were given: Pain Booklet, Coughing and Deep Breathing, MRSA Information and Surgical Site Infection Prevention

## 2012-11-01 ENCOUNTER — Encounter: Payer: 59 | Attending: Physical Medicine & Rehabilitation | Admitting: Physical Medicine & Rehabilitation

## 2012-11-01 ENCOUNTER — Encounter: Payer: Self-pay | Admitting: Physical Medicine & Rehabilitation

## 2012-11-01 VITALS — BP 131/62 | HR 91 | Resp 14 | Ht 61.0 in | Wt 172.6 lb

## 2012-11-01 DIAGNOSIS — M47817 Spondylosis without myelopathy or radiculopathy, lumbosacral region: Secondary | ICD-10-CM

## 2012-11-01 DIAGNOSIS — S99919A Unspecified injury of unspecified ankle, initial encounter: Secondary | ICD-10-CM

## 2012-11-01 DIAGNOSIS — S8990XA Unspecified injury of unspecified lower leg, initial encounter: Secondary | ICD-10-CM | POA: Insufficient documentation

## 2012-11-01 DIAGNOSIS — M47816 Spondylosis without myelopathy or radiculopathy, lumbar region: Secondary | ICD-10-CM

## 2012-11-01 DIAGNOSIS — X58XXXA Exposure to other specified factors, initial encounter: Secondary | ICD-10-CM | POA: Insufficient documentation

## 2012-11-01 DIAGNOSIS — M1990 Unspecified osteoarthritis, unspecified site: Secondary | ICD-10-CM

## 2012-11-01 DIAGNOSIS — S99929A Unspecified injury of unspecified foot, initial encounter: Secondary | ICD-10-CM | POA: Insufficient documentation

## 2012-11-01 DIAGNOSIS — S838X9A Sprain of other specified parts of unspecified knee, initial encounter: Secondary | ICD-10-CM

## 2012-11-01 MED ORDER — OXYCODONE HCL 10 MG PO TABS: 10.0000 mg | ORAL_TABLET | Freq: Four times a day (QID) | ORAL | Status: DC | PRN

## 2012-11-01 NOTE — Patient Instructions (Signed)
Call me with questions 

## 2012-11-01 NOTE — Progress Notes (Signed)
Subjective:    Patient ID: [name], female    DOB: 1955-11-15, 57 y.o.   MRN: [id number]  HPI  [name] is here in follow up of her multiple pain complaints. She saw Dr. Yetta Barre who has scheduled lumbar fusion surgery for her on Friday. Pain levels have remained constant. She has tried to be conservative with her activities most recently. She did not receive the second injection by Dr. Wynn Banker.   The oxycodone has been helpful for breakthrough pain moreso than the hydrocodone.    Pain Inventory Average Pain 8 Pain Right Now 8 My pain is sharp, burning, dull, stabbing, tingling and aching  In the last 24 hours, has pain interfered with the following? General activity 10 Relation with others 10 Enjoyment of life 10 What TIME of day is your pain at its worst? night Sleep (in general) Poor  Pain is worse with: walking, bending, sitting, inactivity, standing and some activites Pain improves with: rest, pacing activities and medication Relief from Meds: 10  Mobility walk with assistance use a cane use a walker ability to climb steps?  yes do you drive?  yes  Function disabled: date disabled 2008 I need assistance with the following:  dressing, toileting, meal prep, household duties and shopping  Neuro/Psych bladder control problems weakness numbness tremor tingling trouble walking spasms dizziness confusion depression anxiety loss of taste or smell suicidal thoughts does not have a plan  Prior Studies Any changes since last visit?  no  Physicians involved in your care Any changes since last visit?  yes Neurosurgeon Marikay Alar   Family History  Problem Relation Age of Onset  . Cancer Mother     cervical  . Cancer Father     colon   History   Social History  . Marital Status: Married    Spouse Name: N/A    Number of Children: N/A  . Years of Education: N/A   Social History Main Topics  . Smoking status: Never Smoker   . Smokeless tobacco:  Never Used  . Alcohol Use: No  . Drug Use: No  . Sexually Active: Yes   Other Topics Concern  . None   Social History Narrative  . None   Past Surgical History  Procedure Date  . Abdominal hysterectomy 1999  . Tonsillectomy 1981  . Tubal ligation 1997  . Bunions removed 1988  . Edg 12/17/2004  . Electrocardiogram 05/27/2007   Past Medical History  Diagnosis Date  . ANXIETY 06/10/2007  . ASYMPTOMATIC POSTMENOPAUSAL STATUS 07/19/2009  . Cough 04/02/2009  . DYSLIPIDEMIA 06/28/2008  . GERD 06/10/2007  . 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 HYPERGLYCEMIA 06/28/2008  . HYPERTENSION 06/10/2007  . Irritable bowel syndrome 06/28/2008  . OSTEOARTHRITIS 06/10/2007  . UPPER RESPIRATORY INFECTION 03/26/2009  . WEIGHT GAIN 01/21/2010   BP 131/62  Pulse 91  Resp 14  Ht 5\' 1"  (1.549 m)  Wt 172 lb 9.6 oz (78.291 kg)  BMI 32.61 kg/m2  SpO2 94%    Review of Systems  Genitourinary:       Bladder control problems   Musculoskeletal: Positive for gait problem.       Spasms   Neurological: Positive for dizziness, tremors, weakness and numbness.       Tingling   Psychiatric/Behavioral: Positive for suicidal ideas, confusion and dysphoric mood. The patient is nervous/anxious.        No plan  All other systems reviewed and are negative.       Objective:   Physical Exam  Constitutional: She is oriented to person, place, and time. She appears well-developed and well-nourished.  HENT:  Head: Normocephalic and atraumatic.  Eyes: Conjunctivae and EOM are normal. Pupils are equal, round, and reactive to light.  Neck: Normal range of motion.  Cardiovascular: Normal rate.  Pulmonary/Chest: Effort normal and breath sounds normal.  Abdominal: Soft. Bowel sounds are normal.  Musculoskeletal: Normal range of motion.  Back remains tender with extension and flexion especially over the L4-S1 levels centrally. Minimal radiation of the pain on exam. Both legs are 5/5 with normal sensation and reflexes. She had difficulty  standing in full extension. SLR was negative with hamstring tightness noted in both legs, left greater than right.  Neurological: She is alert and oriented to person, place, and time but was more distractible today and had some difficulty with attention. She had 1-2 beats nystagmus with left lateral gaze. She had difficulty with heel to toe walking, frequently losing her balance. She required a cane for balance in normal gait.  Skin: Skin is warm.  Psychiatric: She has a normal mood and affect, except for appearing a little down. Her behavior is normal. Judgment and thought content normal.   Assessment & Plan:   ASSESSMENT:  1. 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 Recent MRI notable for protruded disc at L4-5 and an extruded disc at L5-S1.  2. Left knee meniscus injury.  3. Depression with anxiety.  4. Myofascial pain.  5. Concussion on 8/2 with post concussion syndrome   PLAN:  1. Lumbar fusion surgery Friday per Dr. Yetta Barre. Likely L5-S1, perhaps L4-5.   2. Continue to work on vestibular sx. These appear to be gradually improving.   3. Oxycodone is working for pain. Refilled #120 for pain.  She may need a little more coverage initially post-op. Consider long acting agent in the hospital if needed. 4.I'll see her back in about a month.

## 2012-11-02 MED ORDER — DEXAMETHASONE SODIUM PHOSPHATE 10 MG/ML IJ SOLN
10.0000 mg | INTRAMUSCULAR | Status: AC
  Administered 2012-11-03: 10 mg via INTRAVENOUS
  Filled 2012-11-02: qty 1

## 2012-11-02 MED ORDER — CEFAZOLIN SODIUM-DEXTROSE 2-3 GM-% IV SOLR
2.0000 g | INTRAVENOUS | Status: AC
  Administered 2012-11-03: 2 g via INTRAVENOUS
  Filled 2012-11-02: qty 50

## 2012-11-03 ENCOUNTER — Encounter (HOSPITAL_COMMUNITY): Payer: Self-pay | Admitting: *Deleted

## 2012-11-03 ENCOUNTER — Encounter (HOSPITAL_COMMUNITY): Payer: Self-pay | Admitting: Neurological Surgery

## 2012-11-03 ENCOUNTER — Ambulatory Visit (HOSPITAL_COMMUNITY): Payer: 59 | Admitting: *Deleted

## 2012-11-03 ENCOUNTER — Inpatient Hospital Stay (HOSPITAL_COMMUNITY): Payer: 59

## 2012-11-03 ENCOUNTER — Inpatient Hospital Stay (HOSPITAL_COMMUNITY)
Admission: RE | Admit: 2012-11-03 | Discharge: 2012-11-05 | DRG: 460 | Disposition: A | Discharge status: Home / Self Care | Payer: 59 | Source: Ambulatory Visit | Attending: Neurological Surgery | Admitting: Neurological Surgery

## 2012-11-03 ENCOUNTER — Encounter (HOSPITAL_COMMUNITY)
Admission: RE | Disposition: A | Discharge status: Home / Self Care | Payer: Self-pay | Source: Ambulatory Visit | Attending: Neurological Surgery

## 2012-11-03 DIAGNOSIS — M47816 Spondylosis without myelopathy or radiculopathy, lumbar region: Secondary | ICD-10-CM

## 2012-11-03 DIAGNOSIS — M5137 Other intervertebral disc degeneration, lumbosacral region: Secondary | ICD-10-CM | POA: Diagnosis present

## 2012-11-03 DIAGNOSIS — M51379 Other intervertebral disc degeneration, lumbosacral region without mention of lumbar back pain or lower extremity pain: Secondary | ICD-10-CM | POA: Diagnosis present

## 2012-11-03 DIAGNOSIS — M1990 Unspecified osteoarthritis, unspecified site: Secondary | ICD-10-CM | POA: Diagnosis present

## 2012-11-03 DIAGNOSIS — E785 Hyperlipidemia, unspecified: Secondary | ICD-10-CM | POA: Diagnosis present

## 2012-11-03 DIAGNOSIS — Z79899 Other long term (current) drug therapy: Secondary | ICD-10-CM

## 2012-11-03 DIAGNOSIS — S838X9A Sprain of other specified parts of unspecified knee, initial encounter: Secondary | ICD-10-CM

## 2012-11-03 DIAGNOSIS — M47817 Spondylosis without myelopathy or radiculopathy, lumbosacral region: Principal | ICD-10-CM | POA: Diagnosis present

## 2012-11-03 DIAGNOSIS — I10 Essential (primary) hypertension: Secondary | ICD-10-CM | POA: Diagnosis present

## 2012-11-03 DIAGNOSIS — K219 Gastro-esophageal reflux disease without esophagitis: Secondary | ICD-10-CM | POA: Diagnosis present

## 2012-11-03 DIAGNOSIS — K589 Irritable bowel syndrome without diarrhea: Secondary | ICD-10-CM | POA: Diagnosis present

## 2012-11-03 DIAGNOSIS — F411 Generalized anxiety disorder: Secondary | ICD-10-CM | POA: Diagnosis present

## 2012-11-03 SURGERY — POSTERIOR LUMBAR FUSION 2 LEVEL
Anesthesia: General | Site: Back | Wound class: Clean

## 2012-11-03 MED ORDER — KETOROLAC TROMETHAMINE 30 MG/ML IJ SOLN
INTRAMUSCULAR | Status: DC | PRN
  Administered 2012-11-03: 30 mg via INTRAVENOUS

## 2012-11-03 MED ORDER — NALOXONE HCL 0.4 MG/ML IJ SOLN: 0.4000 mg | INTRAMUSCULAR | Status: DC | PRN

## 2012-11-03 MED ORDER — HYDROCHLOROTHIAZIDE 12.5 MG PO CAPS
12.5000 mg | ORAL_CAPSULE | Freq: Every day | ORAL | Status: DC
  Administered 2012-11-03 – 2012-11-05 (×3): 12.5 mg via ORAL
  Filled 2012-11-03 (×4): qty 1

## 2012-11-03 MED ORDER — SODIUM CHLORIDE 0.9 % IV SOLN
10.0000 mg | INTRAVENOUS | Status: DC | PRN
  Administered 2012-11-03: 10 ug/min via INTRAVENOUS

## 2012-11-03 MED ORDER — VENLAFAXINE HCL ER 225 MG PO TB24: 225.0000 mg | ORAL_TABLET | Freq: Every day | ORAL | Status: DC

## 2012-11-03 MED ORDER — HYDROMORPHONE 0.3 MG/ML IV SOLN
INTRAVENOUS | Status: DC
  Administered 2012-11-03: 4.16 mg via INTRAVENOUS
  Administered 2012-11-03: 3.3 mg via INTRAVENOUS
  Administered 2012-11-03: 16:00:00 via INTRAVENOUS
  Administered 2012-11-04: 4.5 mg via INTRAVENOUS
  Administered 2012-11-04: 1.2 mg via INTRAVENOUS
  Administered 2012-11-04: 5.1 mg via INTRAVENOUS
  Administered 2012-11-04: 7.5 mg via INTRAVENOUS
  Administered 2012-11-05: 06:00:00 via INTRAVENOUS
  Filled 2012-11-03 (×4): qty 25

## 2012-11-03 MED ORDER — SUCCINYLCHOLINE CHLORIDE 20 MG/ML IJ SOLN
INTRAMUSCULAR | Status: DC | PRN
  Administered 2012-11-03: 140 mg via INTRAVENOUS

## 2012-11-03 MED ORDER — SODIUM CHLORIDE 0.9 % IV SOLN
INTRAVENOUS | Status: DC | PRN
  Administered 2012-11-03: 11:00:00 via INTRAVENOUS

## 2012-11-03 MED ORDER — BACITRACIN 50000 UNITS IM SOLR
INTRAMUSCULAR | Status: AC
  Filled 2012-11-03: qty 1

## 2012-11-03 MED ORDER — DIPHENHYDRAMINE HCL 12.5 MG/5ML PO ELIX: 12.5000 mg | ORAL_SOLUTION | Freq: Four times a day (QID) | ORAL | Status: DC | PRN

## 2012-11-03 MED ORDER — ACETAMINOPHEN 325 MG PO TABS: 650.0000 mg | ORAL_TABLET | ORAL | Status: DC | PRN

## 2012-11-03 MED ORDER — CEFAZOLIN SODIUM 1-5 GM-% IV SOLN
1.0000 g | Freq: Three times a day (TID) | INTRAVENOUS | Status: AC
  Administered 2012-11-03 (×2): 1 g via INTRAVENOUS
  Filled 2012-11-03 (×3): qty 50

## 2012-11-03 MED ORDER — OXYCODONE HCL 5 MG PO TABS: 5.0000 mg | ORAL_TABLET | Freq: Once | ORAL | Status: DC | PRN

## 2012-11-03 MED ORDER — ONDANSETRON HCL 4 MG/2ML IJ SOLN: 4.0000 mg | Freq: Four times a day (QID) | INTRAMUSCULAR | Status: DC | PRN

## 2012-11-03 MED ORDER — DEXAMETHASONE 4 MG PO TABS
4.0000 mg | ORAL_TABLET | Freq: Four times a day (QID) | ORAL | Status: DC
  Administered 2012-11-03 – 2012-11-05 (×7): 4 mg via ORAL
  Filled 2012-11-03 (×12): qty 1

## 2012-11-03 MED ORDER — CYCLOBENZAPRINE HCL 10 MG PO TABS: 10.0000 mg | ORAL_TABLET | Freq: Three times a day (TID) | ORAL | Status: DC | PRN

## 2012-11-03 MED ORDER — PANTOPRAZOLE SODIUM 40 MG PO TBEC
40.0000 mg | DELAYED_RELEASE_TABLET | Freq: Every day | ORAL | Status: DC
  Administered 2012-11-03 – 2012-11-05 (×2): 40 mg via ORAL
  Filled 2012-11-03 (×3): qty 1

## 2012-11-03 MED ORDER — ACETAMINOPHEN 10 MG/ML IV SOLN
INTRAVENOUS | Status: AC
  Administered 2012-11-03: 1000 mg via INTRAVENOUS
  Filled 2012-11-03: qty 100

## 2012-11-03 MED ORDER — HYDROMORPHONE HCL PF 1 MG/ML IJ SOLN
INTRAMUSCULAR | Status: AC
  Filled 2012-11-03: qty 1

## 2012-11-03 MED ORDER — MECLIZINE HCL 12.5 MG PO TABS
12.5000 mg | ORAL_TABLET | Freq: Three times a day (TID) | ORAL | Status: DC | PRN
  Filled 2012-11-03: qty 1

## 2012-11-03 MED ORDER — MORPHINE SULFATE 2 MG/ML IJ SOLN
INTRAMUSCULAR | Status: AC
  Filled 2012-11-03: qty 1

## 2012-11-03 MED ORDER — LACTATED RINGERS IV SOLN
INTRAVENOUS | Status: DC | PRN
  Administered 2012-11-03 (×3): via INTRAVENOUS

## 2012-11-03 MED ORDER — HYDROMORPHONE HCL PF 1 MG/ML IJ SOLN
0.2500 mg | INTRAMUSCULAR | Status: DC | PRN
  Administered 2012-11-03 (×6): 0.5 mg via INTRAVENOUS

## 2012-11-03 MED ORDER — DIPHENHYDRAMINE HCL 50 MG/ML IJ SOLN: 12.5000 mg | Freq: Four times a day (QID) | INTRAMUSCULAR | Status: DC | PRN

## 2012-11-03 MED ORDER — ACETAMINOPHEN 10 MG/ML IV SOLN
1000.0000 mg | Freq: Four times a day (QID) | INTRAVENOUS | Status: AC
  Administered 2012-11-03 – 2012-11-04 (×4): 1000 mg via INTRAVENOUS
  Filled 2012-11-03 (×5): qty 100

## 2012-11-03 MED ORDER — KETOROLAC TROMETHAMINE 30 MG/ML IJ SOLN: 30.0000 mg | Freq: Three times a day (TID) | INTRAMUSCULAR | Status: DC | PRN

## 2012-11-03 MED ORDER — ACETAMINOPHEN 650 MG RE SUPP: 650.0000 mg | RECTAL | Status: DC | PRN

## 2012-11-03 MED ORDER — MORPHINE SULFATE 2 MG/ML IJ SOLN
1.0000 mg | INTRAMUSCULAR | Status: DC | PRN
  Administered 2012-11-03: 4 mg via INTRAVENOUS

## 2012-11-03 MED ORDER — SODIUM CHLORIDE 0.9 % IJ SOLN: 3.0000 mL | INTRAMUSCULAR | Status: DC | PRN

## 2012-11-03 MED ORDER — ONDANSETRON HCL 4 MG/2ML IJ SOLN: 4.0000 mg | INTRAMUSCULAR | Status: DC | PRN

## 2012-11-03 MED ORDER — PHENOL 1.4 % MT LIQD: 1.0000 | OROMUCOSAL | Status: DC | PRN

## 2012-11-03 MED ORDER — SODIUM CHLORIDE 0.9 % IR SOLN
Status: DC | PRN
  Administered 2012-11-03: 07:00:00

## 2012-11-03 MED ORDER — DEXAMETHASONE SODIUM PHOSPHATE 4 MG/ML IJ SOLN
4.0000 mg | Freq: Four times a day (QID) | INTRAMUSCULAR | Status: DC
  Filled 2012-11-03 (×8): qty 1

## 2012-11-03 MED ORDER — FENTANYL CITRATE 0.05 MG/ML IJ SOLN
INTRAMUSCULAR | Status: DC | PRN
  Administered 2012-11-03: 100 ug via INTRAVENOUS
  Administered 2012-11-03: 50 ug via INTRAVENOUS
  Administered 2012-11-03: 100 ug via INTRAVENOUS
  Administered 2012-11-03 (×5): 50 ug via INTRAVENOUS
  Administered 2012-11-03: 100 ug via INTRAVENOUS
  Administered 2012-11-03 (×2): 50 ug via INTRAVENOUS

## 2012-11-03 MED ORDER — MIDAZOLAM HCL 5 MG/5ML IJ SOLN
INTRAMUSCULAR | Status: DC | PRN
  Administered 2012-11-03: 2 mg via INTRAVENOUS

## 2012-11-03 MED ORDER — SODIUM CHLORIDE 0.9 % IJ SOLN: 9.0000 mL | INTRAMUSCULAR | Status: DC | PRN

## 2012-11-03 MED ORDER — DIAZEPAM 5 MG PO TABS
5.0000 mg | ORAL_TABLET | Freq: Four times a day (QID) | ORAL | Status: DC | PRN
  Administered 2012-11-03 – 2012-11-04 (×3): 5 mg via ORAL
  Filled 2012-11-03 (×3): qty 1

## 2012-11-03 MED ORDER — MENTHOL 3 MG MT LOZG
1.0000 | LOZENGE | OROMUCOSAL | Status: DC | PRN
  Filled 2012-11-03: qty 9

## 2012-11-03 MED ORDER — LISINOPRIL 10 MG PO TABS
10.0000 mg | ORAL_TABLET | Freq: Every day | ORAL | Status: DC
  Administered 2012-11-03 – 2012-11-05 (×3): 10 mg via ORAL
  Filled 2012-11-03 (×4): qty 1

## 2012-11-03 MED ORDER — VENLAFAXINE HCL ER 150 MG PO CP24
225.0000 mg | ORAL_CAPSULE | Freq: Every day | ORAL | Status: DC
  Administered 2012-11-03 – 2012-11-05 (×3): 225 mg via ORAL
  Filled 2012-11-03 (×5): qty 1

## 2012-11-03 MED ORDER — LIDOCAINE HCL (CARDIAC) 20 MG/ML IV SOLN
INTRAVENOUS | Status: DC | PRN
  Administered 2012-11-03: 60 mg via INTRAVENOUS

## 2012-11-03 MED ORDER — POTASSIUM CHLORIDE IN NACL 20-0.9 MEQ/L-% IV SOLN
INTRAVENOUS | Status: DC
  Administered 2012-11-03 – 2012-11-04 (×2): via INTRAVENOUS
  Filled 2012-11-03 (×5): qty 1000

## 2012-11-03 MED ORDER — SODIUM CHLORIDE 0.9 % IV SOLN
INTRAVENOUS | Status: AC
  Filled 2012-11-03: qty 500

## 2012-11-03 MED ORDER — PROPOFOL 10 MG/ML IV BOLUS
INTRAVENOUS | Status: DC | PRN
  Administered 2012-11-03: 120 mg via INTRAVENOUS

## 2012-11-03 MED ORDER — SENNA 8.6 MG PO TABS
1.0000 | ORAL_TABLET | Freq: Two times a day (BID) | ORAL | Status: DC
  Administered 2012-11-03 – 2012-11-05 (×4): 8.6 mg via ORAL
  Filled 2012-11-03 (×5): qty 1

## 2012-11-03 MED ORDER — HEMOSTATIC AGENTS (NO CHARGE) OPTIME
TOPICAL | Status: DC | PRN
  Administered 2012-11-03: 1 via TOPICAL

## 2012-11-03 MED ORDER — SODIUM CHLORIDE 0.9 % IJ SOLN
3.0000 mL | Freq: Two times a day (BID) | INTRAMUSCULAR | Status: DC
  Administered 2012-11-03: 3 mL via INTRAVENOUS

## 2012-11-03 MED ORDER — MEPERIDINE HCL 25 MG/ML IJ SOLN: 6.2500 mg | INTRAMUSCULAR | Status: DC | PRN

## 2012-11-03 MED ORDER — OXYCODONE HCL 5 MG PO TABS
10.0000 mg | ORAL_TABLET | Freq: Four times a day (QID) | ORAL | Status: DC | PRN
  Administered 2012-11-03 – 2012-11-05 (×5): 10 mg via ORAL
  Filled 2012-11-03 (×6): qty 2

## 2012-11-03 MED ORDER — BUPIVACAINE HCL (PF) 0.25 % IJ SOLN
INTRAMUSCULAR | Status: DC | PRN
  Administered 2012-11-03: 6 mL

## 2012-11-03 MED ORDER — OXYCODONE HCL 10 MG PO TABS: 10.0000 mg | ORAL_TABLET | Freq: Four times a day (QID) | ORAL | Status: DC | PRN

## 2012-11-03 MED ORDER — OXYCODONE HCL 5 MG/5ML PO SOLN: 5.0000 mg | Freq: Once | ORAL | Status: DC | PRN

## 2012-11-03 MED ORDER — ONDANSETRON HCL 4 MG/2ML IJ SOLN: 4.0000 mg | Freq: Once | INTRAMUSCULAR | Status: DC | PRN

## 2012-11-03 MED ORDER — ARTIFICIAL TEARS OP OINT
TOPICAL_OINTMENT | OPHTHALMIC | Status: DC | PRN
  Administered 2012-11-03: 1 via OPHTHALMIC

## 2012-11-03 MED ORDER — THROMBIN 20000 UNITS EX SOLR
CUTANEOUS | Status: DC | PRN
  Administered 2012-11-03: 07:00:00 via TOPICAL

## 2012-11-03 MED ORDER — 0.9 % SODIUM CHLORIDE (POUR BTL) OPTIME
TOPICAL | Status: DC | PRN
  Administered 2012-11-03: 1000 mL

## 2012-11-03 MED ORDER — ACYCLOVIR 800 MG PO TABS
800.0000 mg | ORAL_TABLET | Freq: Every day | ORAL | Status: DC
  Administered 2012-11-03 – 2012-11-05 (×3): 800 mg via ORAL
  Filled 2012-11-03 (×4): qty 1

## 2012-11-03 MED ORDER — ONDANSETRON HCL 4 MG/2ML IJ SOLN
INTRAMUSCULAR | Status: DC | PRN
  Administered 2012-11-03: 4 mg via INTRAVENOUS

## 2012-11-03 MED ORDER — ALPRAZOLAM 0.5 MG PO TABS
0.5000 mg | ORAL_TABLET | Freq: Three times a day (TID) | ORAL | Status: DC | PRN
  Administered 2012-11-04: 0.5 mg via ORAL
  Filled 2012-11-03: qty 1

## 2012-11-03 MED ORDER — LISINOPRIL-HYDROCHLOROTHIAZIDE 10-12.5 MG PO TABS: 0.5000 | ORAL_TABLET | Freq: Every day | ORAL | Status: DC

## 2012-11-03 MED ORDER — LIDOCAINE HCL 4 % MT SOLN
OROMUCOSAL | Status: DC | PRN
  Administered 2012-11-03: 4 mL via TOPICAL

## 2012-11-03 MED ORDER — SODIUM CHLORIDE 0.9 % IV SOLN: 250.0000 mL | INTRAVENOUS | Status: DC

## 2012-11-03 SURGICAL SUPPLY — 63 items
BAG DECANTER FOR FLEXI CONT (MISCELLANEOUS) ×2 IMPLANT
BENZOIN TINCTURE PRP APPL 2/3 (GAUZE/BANDAGES/DRESSINGS) ×2 IMPLANT
BLADE SURG ROTATE 9660 (MISCELLANEOUS) IMPLANT
BONE MATRIX OSTEOCEL PLUS 5CC (Bone Implant) ×2 IMPLANT
BUR MATCHSTICK NEURO 3.0 LAGG (BURR) ×2 IMPLANT
CAGE COROENT MP 8X23 (Cage) ×4 IMPLANT
CANISTER SUCTION 2500CC (MISCELLANEOUS) ×2 IMPLANT
CLIP NEUROVISION LG (CLIP) ×2 IMPLANT
CLOTH BEACON ORANGE TIMEOUT ST (SAFETY) ×2 IMPLANT
CONT SPEC 4OZ CLIKSEAL STRL BL (MISCELLANEOUS) ×4 IMPLANT
COVER BACK TABLE 24X17X13 BIG (DRAPES) IMPLANT
COVER TABLE BACK 60X90 (DRAPES) ×2 IMPLANT
DRAPE C-ARM 42X72 X-RAY (DRAPES) ×4 IMPLANT
DRAPE C-ARMOR (DRAPES) ×2 IMPLANT
DRAPE LAPAROTOMY 100X72X124 (DRAPES) ×2 IMPLANT
DRAPE POUCH INSTRU U-SHP 10X18 (DRAPES) ×2 IMPLANT
DRAPE SURG 17X23 STRL (DRAPES) ×2 IMPLANT
DRESSING TELFA 8X3 (GAUZE/BANDAGES/DRESSINGS) ×2 IMPLANT
DRSG OPSITE 4X5.5 SM (GAUZE/BANDAGES/DRESSINGS) ×2 IMPLANT
DURAPREP 26ML APPLICATOR (WOUND CARE) ×2 IMPLANT
ELECT REM PT RETURN 9FT ADLT (ELECTROSURGICAL) ×2
ELECTRODE REM PT RTRN 9FT ADLT (ELECTROSURGICAL) ×1 IMPLANT
EVACUATOR 1/8 PVC DRAIN (DRAIN) ×2 IMPLANT
GAUZE SPONGE 4X4 16PLY XRAY LF (GAUZE/BANDAGES/DRESSINGS) IMPLANT
GLOVE BIO SURGEON STRL SZ8 (GLOVE) ×4 IMPLANT
GLOVE INDICATOR 7.0 STRL GRN (GLOVE) ×2 IMPLANT
GLOVE INDICATOR 8.5 STRL (GLOVE) ×4 IMPLANT
GLOVE OPTIFIT SS 6.5 STRL BRWN (GLOVE) ×2 IMPLANT
GOWN BRE IMP SLV AUR LG STRL (GOWN DISPOSABLE) ×2 IMPLANT
GOWN BRE IMP SLV AUR XL STRL (GOWN DISPOSABLE) ×4 IMPLANT
GOWN STRL REIN 2XL LVL4 (GOWN DISPOSABLE) IMPLANT
HEMOSTAT POWDER KIT SURGIFOAM (HEMOSTASIS) IMPLANT
KIT BASIN OR (CUSTOM PROCEDURE TRAY) ×2 IMPLANT
KIT NEEDLE NVM5 EMG ELECT (KITS) ×1 IMPLANT
KIT NEEDLE NVM5 EMG ELECTRODE (KITS) ×1
KIT ROOM TURNOVER OR (KITS) ×2 IMPLANT
MILL MEDIUM DISP (BLADE) ×2 IMPLANT
NEEDLE HYPO 25X1 1.5 SAFETY (NEEDLE) ×2 IMPLANT
NS IRRIG 1000ML POUR BTL (IV SOLUTION) ×2 IMPLANT
PACK LAMINECTOMY NEURO (CUSTOM PROCEDURE TRAY) ×2 IMPLANT
PAD ARMBOARD 7.5X6 YLW CONV (MISCELLANEOUS) ×6 IMPLANT
ROD 55MM (Rod) ×2 IMPLANT
ROD SPNL 55XPREBNT NS MAS (Rod) ×2 IMPLANT
SCREW LOCK (Screw) ×6 IMPLANT
SCREW LOCK FXNS SPNE MAS PL (Screw) ×6 IMPLANT
SCREW PLIF MAS 5.5X35 LUMBAR (Screw) ×4 IMPLANT
SCREW SHANK 5.0X30MM (Screw) ×2 IMPLANT
SCREW TULIP 5.5 (Screw) ×8 IMPLANT
SPONGE LAP 4X18 X RAY DECT (DISPOSABLE) IMPLANT
SPONGE SURGIFOAM ABS GEL 100 (HEMOSTASIS) ×2 IMPLANT
STRIP CLOSURE SKIN 1/2X4 (GAUZE/BANDAGES/DRESSINGS) ×2 IMPLANT
SUT VIC AB 0 CT1 18XCR BRD8 (SUTURE) ×1 IMPLANT
SUT VIC AB 0 CT1 8-18 (SUTURE) ×1
SUT VIC AB 2-0 CP2 18 (SUTURE) ×2 IMPLANT
SUT VIC AB 3-0 SH 8-18 (SUTURE) ×4 IMPLANT
SYR 20ML ECCENTRIC (SYRINGE) ×2 IMPLANT
TOWEL OR 17X24 6PK STRL BLUE (TOWEL DISPOSABLE) ×2 IMPLANT
TOWEL OR 17X26 10 PK STRL BLUE (TOWEL DISPOSABLE) ×2 IMPLANT
TRAP SPECIMEN MUCOUS 40CC (MISCELLANEOUS) IMPLANT
TRAY FOLEY CATH 14FRSI W/METER (CATHETERS) ×2 IMPLANT
WATER STERILE IRR 1000ML POUR (IV SOLUTION) ×2 IMPLANT
mas plif cage 8x9x23 12 degree nuvasive (Cage) ×4 IMPLANT
mas plif shanks nuvasive ×6 IMPLANT

## 2012-11-03 NOTE — Anesthesia Preprocedure Evaluation (Addendum)
Anesthesia Evaluation  Patient identified by MRN, date of birth, ID band Patient awake    Reviewed: Allergy & Precautions, H&P , NPO status , Patient's Chart, lab work & pertinent test results  History of Anesthesia Complications Negative for: history of anesthetic complications  Airway Mallampati: I TM Distance: >3 FB Neck ROM: Limited    Dental  (+) Edentulous Lower, Edentulous Upper and Dental Advisory Given   Pulmonary neg pulmonary ROS,          Cardiovascular hypertension, Pt. on medications     Neuro/Psych    GI/Hepatic GERD-  Medicated and Controlled,  Endo/Other    Renal/GU      Musculoskeletal   Abdominal   Peds  Hematology   Anesthesia Other Findings   Reproductive/Obstetrics                          Anesthesia Physical Anesthesia Plan  ASA: II  Anesthesia Plan: General   Post-op Pain Management:    Induction: Intravenous  Airway Management Planned: Oral ETT  Additional Equipment:   Intra-op Plan:   Post-operative Plan: Extubation in OR  Informed Consent: I have reviewed the patients History and Physical, chart, labs and discussed the procedure including the risks, benefits and alternatives for the proposed anesthesia with the patient or authorized representative who has indicated his/her understanding and acceptance.     Plan Discussed with: CRNA and Surgeon  Anesthesia Plan Comments:         Anesthesia Quick Evaluation

## 2012-11-03 NOTE — Preoperative (Signed)
Beta Blockers   Reason not to administer Beta Blockers:Not Applicable 

## 2012-11-03 NOTE — Progress Notes (Signed)
Dr Yetta Barre at bedside.  Order received to remove foley catheter.

## 2012-11-03 NOTE — H&P (Signed)
Subjective: Patient is a 57 y.o. female admitted for PLIF. Onset of symptoms was several years ago, gradually worsening since that time.  The pain is rated severe, and is located at the across the lower back and radiates to legs. The pain is described as aching and throbbing and occurs all day. The symptoms have been progressive. Symptoms are exacerbated by exercise, standing and twisting. MRI or CT showed stenosis/ DDD/ spondylosis.   Past Medical History  Diagnosis Date  . ANXIETY 06/10/2007  . ASYMPTOMATIC POSTMENOPAUSAL STATUS 07/19/2009  . Cough 04/02/2009  . DYSLIPIDEMIA 06/28/2008  . GERD 06/10/2007  . 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 HYPERGLYCEMIA 06/28/2008  . HYPERTENSION 06/10/2007  . Irritable bowel syndrome 06/28/2008  . OSTEOARTHRITIS 06/10/2007  . UPPER RESPIRATORY INFECTION 03/26/2009  . WEIGHT GAIN 01/21/2010    Past Surgical History  Procedure Date  . Abdominal hysterectomy 1999  . Tonsillectomy 1981  . Tubal ligation 1997  . Bunions removed 1988  . Edg 12/17/2004  . Electrocardiogram 05/27/2007    Prior to Admission medications   Medication Sig Start Date End Date Taking? Authorizing Provider  acyclovir (ZOVIRAX) 800 MG tablet Take 800 mg by mouth daily.   Yes Historical Provider, MD  ALPRAZolam Prudy Feeler) 0.5 MG tablet Take 0.5 mg by mouth 3 (three) times daily as needed. For anxiety   Yes Historical Provider, MD  atorvastatin (LIPITOR) 40 MG tablet Take 1 tablet (40 mg total) by mouth daily. 08/17/12  Yes Romero Belling, MD  cyclobenzaprine (FLEXERIL) 5 MG tablet Take 5 mg by mouth 3 (three) times daily as needed. spasm 05/06/12  Yes Historical Provider, MD  lisinopril-hydrochlorothiazide (PRINZIDE,ZESTORETIC) 10-12.5 MG per tablet Take 0.5 tablets by mouth daily.   Yes Historical Provider, MD  meclizine (ANTIVERT) 12.5 MG tablet Take 12.5 mg by mouth 3 (three) times daily as needed. nausea   Yes Historical Provider, MD  naproxen sodium (ANAPROX) 220 MG tablet Take 220 mg by mouth 2 (two) times  daily as needed. pain   Yes Historical Provider, MD  omeprazole (PRILOSEC) 40 MG capsule Take 1 capsule (40 mg total) by mouth daily. 08/17/12  Yes Romero Belling, MD  Oxycodone HCl 10 MG TABS Take 1 tablet (10 mg total) by mouth every 6 (six) hours as needed. pain 11/01/12  Yes [name], MD  Venlafaxine HCl 225 MG TB24 Take 1 tablet (225 mg total) by mouth daily. 09/28/12  Yes [name], MD   No Known Allergies  History  Substance Use Topics  . Smoking status: Never Smoker   . Smokeless tobacco: Never Used  . Alcohol Use: No    Family History  Problem Relation Age of Onset  . Cancer Mother     cervical  . Cancer Father     colon     Review of Systems  Positive ROS: neg  All other systems have been reviewed and were otherwise negative with the exception of those mentioned in the HPI and as above.  Objective: Vital signs in last 24 hours: Temp:  [98.2 F (36.8 C)] 98.2 F (36.8 C) (01/23 0606) Pulse Rate:  [84] 84  (01/23 0606) Resp:  [20] 20  (01/23 0606) BP: (104)/(69) 104/69 mmHg (01/23 0606) SpO2:  [96 %] 96 % (01/23 0606)  General Appearance: Alert, cooperative, no distress, appears stated age Head: Normocephalic, without obvious abnormality, atraumatic Eyes: PERRL, conjunctiva/corneas clear, EOM's intact, fundi benign, both eyes      Ears: Normal TM's and external ear canals, both ears Throat:  Lips, mucosa, and tongue normal; teeth and gums normal Neck: Supple, symmetrical, trachea midline, no adenopathy; thyroid: No enlargement/tenderness/nodules; no carotid bruit or JVD Back: Symmetric, no curvature, ROM normal, no CVA tenderness Lungs: Clear to auscultation bilaterally, respirations unlabored Heart: Regular rate and rhythm, S1 and S2 normal, no murmur, rub or gallop Abdomen: Soft, non-tender, bowel sounds active all four quadrants, no masses, no organomegaly Extremities: Extremities normal, atraumatic, no cyanosis or edema Pulses: 2+ and symmetric all  extremities Skin: Skin color, texture, turgor normal, no rashes or lesions  NEUROLOGIC:   Mental status: Alert and oriented x4,  no aphasia, good attention span, fund of knowledge, and memory Motor Exam - grossly normal Sensory Exam - grossly normal Reflexes: 1+ Coordination - grossly normal Gait - grossly normal Balance - grossly normal Cranial Nerves: I: smell Not tested  II: visual acuity  OS: nl    OD: nl  II: visual fields Full to confrontation  II: pupils Equal, round, reactive to light  III,VII: ptosis None  III,IV,VI: extraocular muscles  Full ROM  V: mastication Normal  V: facial light touch sensation  Normal  V,VII: corneal reflex  Present  VII: facial muscle function - upper  Normal  VII: facial muscle function - lower Normal  VIII: hearing Not tested  IX: soft palate elevation  Normal  IX,X: gag reflex Present  XI: trapezius strength  5/5  XI: sternocleidomastoid strength 5/5  XI: neck flexion strength  5/5  XII: tongue strength  Normal    Data Review Lab Results  Component Value Date   WBC 5.1 10/28/2012   HGB 13.4 10/28/2012   HCT 39.6 10/28/2012   MCV 82.0 10/28/2012   PLT 226 10/28/2012   Lab Results  Component Value Date   NA 139 10/28/2012   K 3.9 10/28/2012   CL 101 10/28/2012   CO2 29 10/28/2012   BUN 10 10/28/2012   CREATININE 0.81 10/28/2012   GLUCOSE 89 10/28/2012   Lab Results  Component Value Date   INR 0.95 10/28/2012    Assessment/Plan: Patient admitted for PLIF L4-5 L5-S1. Patient has failed conservative therapy.  I explained the condition and procedure to the patient and answered any questions.  Patient wishes to proceed with procedure as planned. Understands risks/ benefits and typical outcomes of procedure.   Dorothy Landgrebe S 11/03/2012 7:45 AM

## 2012-11-03 NOTE — Transfer of Care (Signed)
Immediate Anesthesia Transfer of Care Note  Patient: [name]  Procedure(s) Performed: Procedure(s) (LRB) with comments: POSTERIOR LUMBAR FUSION 2 LEVEL (N/A) - Lumbar four-five,lumbar five sacral one maximal access surgery posterior lumbar interbody fusion  Patient Location: PACU  Anesthesia Type:General  Level of Consciousness: awake and alert   Airway & Oxygen Therapy: Patient Spontanous Breathing and Patient connected to face mask oxygen  Post-op Assessment: Report given to PACU RN and Post -op Vital signs reviewed and stable  Post vital signs: Reviewed and stable  Complications: No apparent anesthesia complications

## 2012-11-03 NOTE — OR Nursing (Signed)
NUVASIVE NEEDLE ELECTRODES PLACED PRIOR TO START AND REMOVED END OF CASE

## 2012-11-03 NOTE — Progress Notes (Signed)
Pt requests pain med before tx to rm

## 2012-11-03 NOTE — Anesthesia Postprocedure Evaluation (Signed)
Anesthesia Post Note  Patient: [name]  Procedure(s) Performed: Procedure(s) (LRB): POSTERIOR LUMBAR FUSION 2 LEVEL (N/A)  Anesthesia type: MAC  Patient location: PACU  Post pain: Pain level controlled  Post assessment: Patient's Cardiovascular Status Stable  Last Vitals:  Filed Vitals:   11/03/12 1254  BP:   Pulse: 95  Temp:   Resp: 19    Post vital signs: Reviewed and stable  Level of consciousness: sedated  Complications: No apparent anesthesia complications

## 2012-11-03 NOTE — Op Note (Signed)
11/03/2012  12:01 PM  PATIENT:  [name]  57 y.o. female  PRE-OPERATIVE DIAGNOSIS:  Lumbar spondylosis with lumbar spinal stenosis and instability L4-5 L5-S1  POST-OPERATIVE DIAGNOSIS:  Same  PROCEDURE:   1. Decompressive lumbar laminectomy, hemi-facetectomy and foraminotomies L4-5 L5-S1 to decompress the L4, L5 and S1 nerve roots and adequately address her stenosis requiring more work than would be required of the typical PLIF procedure in order to adequately decompress the neural elements.  2. Posterior lumbar interbody fusion L4-5 and L5-S1 using a PEEK interbody cages packed with morcellized allograft 3. Posterior fixation L4-S1 using Nuvasive cortical pedicle screws.    SURGEON:  Marikay Alar, MD  ASSISTANTS: Dr. Venetia Maxon  ANESTHESIA:  General  EBL: 400 ml  Total I/O In: 2340 [I.V.:2200; Blood:140] Out: 715 [Urine:315; Blood:400]  BLOOD ADMINISTERED:140 CC PRBC  DRAINS: Hemovac   INDICATION FOR PROCEDURE: This patient presented with a 20 year history of progressive pain in her back and legs. She tried medical management for quite some time without relief. She had an MRI which showed multilevel spondylosis and degenerative disc disease with foraminal stenosis. Recommended a 2 level decompression and fusion in hopes of improving her pain syndrome. Patient understood the risks, benefits, and alternatives and potential outcomes and wished to proceed.  PROCEDURE DETAILS:  The patient was brought to the operating room. After induction of generalized endotracheal anesthesia the patient was rolled into the prone position on chest rolls and all pressure points were padded. The patient's lumbar region was cleaned and then prepped with DuraPrep and draped in the usual sterile fashion. Anesthesia was injected and then a dorsal midline incision was made and carried down to the lumbosacral fascia. The fascia was opened and the paraspinous musculature was taken down in a subperiosteal  fashion to expose L4-5 and L5-S1. EMG monitoring was used throughout the entirety of the case. Intraoperative fluoroscopy confirmed my level, and I started with placement of the L4 and L5 cortical pedicle screws. The pedicle screw entry zones were identified utilizing AP fluoroscopy. I then used AP and lateral fluoroscopy to drill an upper and outward direction into the pedicles of L4 and L5 bilaterally. I then tapped each hole and placed 5 5 x 25 mm pedicle screws bilaterally at L4 and L5. I then turned my attention to the decompression to address her spinal stenosis, and the spinous process was removed and complete lumbar laminectomies, hemi- facetectomies, and foraminotomies were performed at L4-5 and L5-S1 to decompress the L4, L5 and S1 nerve roots. The yellow ligament was removed to expose the underlying dura and nerve roots, and generous foraminotomies were performed to adequately decompress the neural elements. Once the decompression was complete, I turned my attention to the posterior lower lumbar interbody fusion. The epidural venous vasculature was coagulated and cut sharply. Disc space was incised and the initial discectomy was performed with pituitary rongeurs. The disc space was distracted with sequential distractors to a height of 8 mm. We then used a series of scrapers and shavers to prepare the endplates for fusion. The midline was prepared with Epstein curettes. Once the complete discectomy was finished, we packed an appropriate sized peek interbody cage with local autograft and morcellized allograft, gently retracted the nerve root, and tapped the cage into position at L4-5 and L5-S1 bilaterally. The midline was packed with morselized autograft and allograft. We then turned our attention to the posterior fixation at S1. The pedicle screw entry zones were identified utilizing surface landmarks and fluoroscopy. We probed  each pedicle with the pedicle probe and tapped each pedicle with the  appropriate tap. We palpated with a ball probe to assure no break in the cortex. We then placed 60 by 35 mm pedicle screws into the pedicles bilaterally at S1.  We then placed lordotic rods into the multiaxial screw heads of the pedicle screws and locked these in position with the locking caps and anti-torque device. We achieved compression of our grafts. We then checked our construct with AP and lateral fluoroscopy. Irrigated with copious amounts of bacitracin-containing saline solution. Placed a medium Hemovac drain through separate stab incision. Inspected the nerve roots once again to assure adequate decompression, lined to the dura with Gelfoam, and closed the muscle and the fascia with 0 Vicryl. Closed the subcutaneous tissues with 2-0 Vicryl and subcuticular tissues with 3-0 Vicryl. The skin was closed with benzoin and Steri-Strips. Dressing was then applied, the patient was awakened from general anesthesia and transported to the recovery room in stable condition. At the end of the procedure all sponge, needle and instrument counts were correct.   PLAN OF CARE: Admit to inpatient   PATIENT DISPOSITION:  PACU - hemodynamically stable.   Delay start of Pharmacological VTE agent (>24hrs) due to surgical blood loss or risk of bleeding:  yes

## 2012-11-03 NOTE — Progress Notes (Signed)
There is no RN available to take report.  Pt on hold in PACU waiting to go to rm.

## 2012-11-04 NOTE — Progress Notes (Signed)
Patient ID: [name], female   DOB: January 22, 1956, 57 y.o.   MRN: [id number] Pt c/o significant back soreness. C/o some R foot numbness. Has good LE strength though moves slowly secondary to pain. Mobilize today. PT/OT.

## 2012-11-04 NOTE — Evaluation (Signed)
Physical Therapy Evaluation Patient Details Name: [name] MRN: [id number] DOB: 28-Jan-1956 Today's Date: 11/04/2012 Time: 0865-7846 PT Time Calculation (min): 33 min  PT Assessment / Plan / Recommendation Clinical Impression  Pt s/p PLF L4-S1. Pt is at supervision level for all mobility, no further acute PT needs. Will not follow. All education completed; pt and husband compliant    PT Assessment  Patent does not need any further PT services    Follow Up Recommendations  No PT follow up;Supervision - Intermittent    Does the patient have the potential to tolerate intense rehabilitation      Barriers to Discharge        Equipment Recommendations  None recommended by PT    Recommendations for Other Services     Frequency      Precautions / Restrictions Precautions Precautions: Back Precaution Booklet Issued: Yes (comment) Precaution Comments: pt educated on 3/3 back precautions Required Braces or Orthoses: Spinal Brace Spinal Brace: Applied in sitting position;Lumbar corset Restrictions Weight Bearing Restrictions: No   Pertinent Vitals/Pain Pain 3/10. Pain meds given prior to session      Mobility  Bed Mobility Bed Mobility: Rolling Left;Left Sidelying to Sit;Sitting - Scoot to Edge of Bed Rolling Left: 5: Supervision Left Sidelying to Sit: 5: Supervision Sitting - Scoot to Edge of Bed: 5: Supervision Details for Bed Mobility Assistance: VC for safe technique to maintain back precautions Transfers Transfers: Stand to Sit;Sit to Stand Sit to Stand: 5: Supervision;With upper extremity assist;From bed Stand to Sit: 5: Supervision;With upper extremity assist;To chair/3-in-1 Details for Transfer Assistance: VC for safe hand placement and technique to maintain back straight throughout transfer Ambulation/Gait Ambulation/Gait Assistance: 5: Supervision Ambulation Distance (Feet): 400 Feet Assistive device: None Ambulation/Gait Assistance Details: Supervision for  safety. Pt with normal gait pattern, no deficits Gait Pattern: Within Functional Limits Gait velocity: normal gait speed Stairs: Yes Stairs Assistance: 5: Supervision Stairs Assistance Details (indicate cue type and reason): Supervision for safety only Stair Management Technique: One rail Right;Step to pattern;Alternating pattern;Forwards Number of Stairs: 12     Shoulder Instructions     Exercises     PT Diagnosis:    PT Problem List:   PT Treatment Interventions:     PT Goals    Visit Information  Last PT Received On: 11/04/12 Assistance Needed: +1    Subjective Data  Patient Stated Goal: to go home   Prior Functioning  Home Living Lives With: Spouse Available Help at Discharge: Family;Available 24 hours/day Type of Home: House Home Access: Stairs to enter [employer] of Steps: 3 Entrance Stairs-Rails: Right;Left;Can reach both Home Layout: One level Bathroom Shower/Tub: Walk-in Contractor: Standard Bathroom Accessibility: Yes How Accessible: Accessible via walker Home Adaptive Equipment: Built-in shower seat;Walker - rolling;Grab bars in shower Prior Function Level of Independence: Independent Able to Take Stairs?: Yes Driving: Yes Vocation: Unemployed Communication Communication: No difficulties Dominant Hand: Right    Cognition  Overall Cognitive Status: Appears within functional limits for tasks assessed/performed Arousal/Alertness: Awake/alert Orientation Level: Appears intact for tasks assessed Behavior During Session: [facility] for tasks performed    Extremity/Trunk Assessment Right Lower Extremity Assessment RLE ROM/Strength/Tone: Within functional levels RLE Sensation: WFL - Light Touch Left Lower Extremity Assessment LLE ROM/Strength/Tone: Within functional levels LLE Sensation: WFL - Light Touch   Balance    End of Session PT - End of Session Equipment Utilized During Treatment: Gait belt;Back brace Activity  Tolerance: Patient tolerated treatment well Patient left: in chair;with call bell/phone  within reach;with family/visitor present Nurse Communication: Mobility status  GP     Milana Kidney 11/04/2012, 10:05 AM

## 2012-11-04 NOTE — Evaluation (Signed)
Occupational Therapy Evaluation Patient Details Name: [name] MRN: [id number] DOB: 1956-03-17 Today's Date: 11/04/2012 Time: 1425-1500 OT Time Calculation (min): 35 min  OT Assessment / Plan / Recommendation Clinical Impression  Pleasant 57 yr old female admitted for elective lumbar fusion.  Currently presents at an overall supervision/modified independent level for simulated selfcare tasks and functional transfers.  Pt will have supervision for all ADLs from her spouse.  No further OT needs of DME.    OT Assessment  Patient does not need any further OT services    Follow Up Recommendations  No OT follow up    Barriers to Discharge None    Equipment Recommendations  None recommended by OT          Precautions / Restrictions Precautions Precautions: Back Precaution Booklet Issued: Yes (comment) Precaution Comments: pt educated on 3/3 back precautions Required Braces or Orthoses: Spinal Brace Spinal Brace: Applied in sitting position;Lumbar corset Restrictions Weight Bearing Restrictions: No   Pertinent Vitals/Pain Pt reported pain 1/10 in her back    ADL  Eating/Feeding: Simulated;Independent Where Assessed - Eating/Feeding: Edge of bed Grooming: Simulated;Modified independent Where Assessed - Grooming: Supported standing Upper Body Bathing: Simulated;Modified independent Where Assessed - Upper Body Bathing: Unsupported sitting Lower Body Bathing: Simulated Where Assessed - Lower Body Bathing: Unsupported sit to stand Upper Body Dressing: Performed;Supervision/safety;Other (comment) (min instructional cueing for back corsette) Where Assessed - Upper Body Dressing: Unsupported sitting Lower Body Dressing: Supervision/safety Where Assessed - Lower Body Dressing: Unsupported sit to stand Toilet Transfer: Simulated;Modified independent Toilet Transfer Method: Other (comment) (ambulate without assistive device) Toilet Transfer Equipment: Regular height  toilet Toileting - Clothing Manipulation and Hygiene: Simulated;Modified independent Where Assessed - Toileting Clothing Manipulation and Hygiene: Sit to stand from 3-in-1 or toilet Tub/Shower Transfer: Simulated;Supervision/safety Tub/Shower Transfer Method: Science writer: Walk in shower Equipment Used: Back brace Transfers/Ambulation Related to ADLs: Pt is overall modified independent for mobility without use of RW. ADL Comments: Pt overall supervision to modified independent with simulated selfcare tasks and functional selfcare tasks.  Able to state 3/3 back precautions.  Educated on need for reacher to help adhere to back precautions for LB selfcare tasks.  She already has a walk-in shower with built in seat.       Visit Information  Last OT Received On: 11/04/12 Assistance Needed: +1    Subjective Data  Subjective: I thought I did pretty good. Patient Stated Goal: Hopefully go home tomorrow.   Prior Functioning     Home Living Lives With: Spouse Available Help at Discharge: Family;Available 24 hours/day Type of Home: House Home Access: Stairs to enter [employer] of Steps: 3 Entrance Stairs-Rails: Right;Left;Can reach both Home Layout: One level Bathroom Shower/Tub: Walk-in Contractor: Standard Bathroom Accessibility: Yes How Accessible: Accessible via walker Home Adaptive Equipment: Built-in shower seat;Walker - rolling;Grab bars in shower Prior Function Level of Independence: Independent Able to Take Stairs?: Yes Driving: Yes Vocation: Unemployed Communication Communication: No difficulties Dominant Hand: Right         Vision/Perception Vision - Assessment Eye Alignment: Within Functional Limits Vision Assessment: Vision not tested Perception Perception: Within Functional Limits Praxis Praxis: Intact   Cognition  Overall Cognitive Status: Appears within functional limits for tasks  assessed/performed Arousal/Alertness: Awake/alert Orientation Level: Appears intact for tasks assessed Behavior During Session: [facility] for tasks performed    Extremity/Trunk Assessment Right Upper Extremity Assessment RUE ROM/Strength/Tone: Within functional levels;Unable to fully assess;Due to precautions (For AROM WFLS) RUE Sensation: WFL - Light  Touch RUE Coordination: WFL - gross/fine motor Left Upper Extremity Assessment LUE ROM/Strength/Tone: Due to precautions;Unable to fully assess;Within functional levels (For AROM WFLS.) LUE Sensation: WFL - Light Touch LUE Coordination: WFL - gross/fine motor Trunk Assessment Trunk Assessment: Normal     Mobility Bed Mobility Bed Mobility: Left Sidelying to Sit;Rolling Left Rolling Left: 5: Supervision Left Sidelying to Sit: 5: Supervision;HOB flat Transfers Transfers: Sit to Stand Sit to Stand: With upper extremity assist;6: Modified independent (Device/Increase time) Stand to Sit: Without upper extremity assist;6: Modified independent (Device/Increase time)           Balance Balance Balance Assessed: Yes Dynamic Standing Balance Dynamic Standing - Balance Support: No upper extremity supported Dynamic Standing - Level of Assistance: 6: Modified independent (Device/Increase time)   End of Session OT - End of Session Equipment Utilized During Treatment: Back brace Activity Tolerance: Patient tolerated treatment well Patient left: in chair;with call bell/phone within reach;with family/visitor present Nurse Communication: Mobility status    Kyera Felan OTR/L Pager number [phone] 11/04/2012, 3:12 PM

## 2012-11-05 NOTE — Progress Notes (Signed)
Subjective: Patient reports She's going great no leg pain back pain is well controlled on pills she wants to go home  Objective: Vital signs in last 24 hours: Temp:  [97.9 F (36.6 C)-98.5 F (36.9 C)] 97.9 F (36.6 C) (01/25 0509) Pulse Rate:  [95-102] 99  (01/25 0509) Resp:  [15-20] 15  (01/25 0627) BP: (109-132)/(57-70) 118/59 mmHg (01/25 0509) SpO2:  [96 %-100 %] 98 % (01/25 0627)  Intake/Output from previous day: 01/24 0701 - 01/25 0700 In: -  Out: 40 [Drains:40] Intake/Output this shift:    Awake alert oriented strength 5 out of 5 wound clean and dry  Lab Results: No results found for this basename: WBC:2,HGB:2,HCT:2,PLT:2 in the last 72 hours BMET No results found for this basename: NA:2,K:2,CL:2,CO2:2,GLUCOSE:2,BUN:2,CREATININE:2,CALCIUM:2 in the last 72 hours  Studies/Results: Dg Lumbar Spine 2-3 Views  11/03/2012  *RADIOLOGY REPORT*  Clinical Data: Decompression and posterior interbody fusion L4-5, L5-S1  DG C-ARM 61-120 MIN,LUMBAR SPINE - 2-3 VIEW  Comparison: MR 07/02/2012  Findings: A series of four spot fluoroscopic images from intraoperative fluoroscopy document placement of   bilateral pedicle screws L4, L5, and S1 with vertical interconnecting rods. Placement of   graft material in the L4-5 and L5-S1 interspaces with graft markers.  IMPRESSION:  PLIF L4-S1.   Original Report Authenticated By: D. Andria Rhein, MD    Dg C-arm 61-120 Min  11/03/2012  *RADIOLOGY REPORT*  Clinical Data: Decompression and posterior interbody fusion L4-5, L5-S1  DG C-ARM 61-120 MIN,LUMBAR SPINE - 2-3 VIEW  Comparison: MR 07/02/2012  Findings: A series of four spot fluoroscopic images from intraoperative fluoroscopy document placement of   bilateral pedicle screws L4, L5, and S1 with vertical interconnecting rods. Placement of   graft material in the L4-5 and L5-S1 interspaces with graft markers.  IMPRESSION:  PLIF L4-S1.   Original Report Authenticated By: D. Andria Rhein, MD      Assessment/Plan: Discharge home  LOS: 2 days     Skippy Marhefka P 11/05/2012, 9:07 AM

## 2012-11-07 HISTORY — PX: OTHER SURGICAL HISTORY: SHX169

## 2012-11-07 NOTE — Discharge Summary (Signed)
  Physician Discharge Summary  Patient ID: [name] MRN: [id number] DOB/AGE: [birth year] 57 y.o.  Admit date: 11/03/2012 Discharge date: 11/05/2012  Admission Diagnoses: Lumbar spondylosis and stenosis L4-5 L5-S1  Discharge Diagnoses: Same Active Problems:  * No active hospital problems. *    Discharged Condition: good  Hospital Course: Patient was admitted to the hospital underwent an L4-5 L5-S1 decompression stabilization procedure postoperatively patient did very well went to recovery room and the floor on the floor patient was convalescing well was angling and voiding spontaneously tolerating regular diet was stable and be discharged home posterior day 2 patient will be discharged scheduled followup Dr. Yetta Barre approximately 1-2 weeks.  Consults: Significant Diagnostic Studies: Treatments: L4-5 L5-S1 fusion Discharge Exam: Blood pressure 130/70, pulse 99, temperature 98.7 F (37.1 C), temperature source Oral, resp. rate 17, height 5\' 1"  (1.549 m), weight 77.111 kg (170 lb), SpO2 98.00%. Awake alert oriented strength out of 5 wound clean and dry  Disposition: Home  Discharge Orders    Future Appointments: Provider: Department: Dept Phone: Center:   12/02/2012 12:40 PM [name], MD [facility] [phone] [facility]       Medication List     As of 11/07/2012  7:07 AM    TAKE these medications         acyclovir 800 MG tablet   Commonly known as: ZOVIRAX   Take 800 mg by mouth daily.      ALPRAZolam 0.5 MG tablet   Commonly known as: XANAX   Take 0.5 mg by mouth 3 (three) times daily as needed. For anxiety      atorvastatin 40 MG tablet   Commonly known as: LIPITOR   Take 1 tablet (40 mg total) by mouth daily.      cyclobenzaprine 5 MG tablet   Commonly known as: FLEXERIL   Take 5 mg by mouth 3 (three) times daily as needed. spasm      lisinopril-hydrochlorothiazide 10-12.5 MG per tablet   Commonly known as:  PRINZIDE,ZESTORETIC   Take 0.5 tablets by mouth daily.      meclizine 12.5 MG tablet   Commonly known as: ANTIVERT   Take 12.5 mg by mouth 3 (three) times daily as needed. nausea      naproxen sodium 220 MG tablet   Commonly known as: ANAPROX   Take 220 mg by mouth 2 (two) times daily as needed. pain      omeprazole 40 MG capsule   Commonly known as: PRILOSEC   Take 1 capsule (40 mg total) by mouth daily.      Oxycodone HCl 10 MG Tabs   Take 1 tablet (10 mg total) by mouth every 6 (six) hours as needed. pain      Venlafaxine HCl 225 MG Tb24   Take 1 tablet (225 mg total) by mouth daily.         Signed: Maripat Borba P 11/07/2012, 7:07 AM

## 2012-11-16 ENCOUNTER — Encounter: Payer: Self-pay | Admitting: Physical Medicine & Rehabilitation

## 2012-11-17 ENCOUNTER — Telehealth: Payer: Self-pay | Admitting: *Deleted

## 2012-11-17 NOTE — Telephone Encounter (Signed)
[name] is out of hospital from back surgery and needs to get her oxy filled.  You gave her an rx on 10/27/12 before her surgery.  She did not take an rx from Dr Yetta Barre and has taken all of those oxy. The prescription you gave her for after surgery (dated 11/01/12) pharmacy will not fill. She is out. Can I tell them to go ahead and fill it?

## 2012-11-17 NOTE — Telephone Encounter (Signed)
That's fine, go ahead and fill.   Thanks

## 2012-11-18 NOTE — Telephone Encounter (Signed)
I spoke with the pharmacy and told them to go ahead and fill [name]'s oxycodone and notified Suzie by voicemail (personally identified) that she can fill her prescription.

## 2012-11-26 ENCOUNTER — Other Ambulatory Visit: Payer: Self-pay

## 2012-12-01 ENCOUNTER — Encounter: Payer: Self-pay | Admitting: Endocrinology

## 2012-12-01 NOTE — Telephone Encounter (Signed)
If pt has had flu shot, please fix epic so it says so, thanks.

## 2012-12-02 ENCOUNTER — Encounter: Payer: 59 | Attending: Physical Medicine & Rehabilitation | Admitting: Physical Medicine & Rehabilitation

## 2012-12-02 ENCOUNTER — Encounter: Payer: Self-pay | Admitting: Physical Medicine & Rehabilitation

## 2012-12-02 VITALS — BP 124/69 | HR 104 | Resp 14 | Ht 61.0 in | Wt 169.0 lb

## 2012-12-02 DIAGNOSIS — M549 Dorsalgia, unspecified: Secondary | ICD-10-CM

## 2012-12-02 DIAGNOSIS — M1990 Unspecified osteoarthritis, unspecified site: Secondary | ICD-10-CM

## 2012-12-02 DIAGNOSIS — F0781 Postconcussional syndrome: Secondary | ICD-10-CM

## 2012-12-02 DIAGNOSIS — Z5181 Encounter for therapeutic drug level monitoring: Secondary | ICD-10-CM

## 2012-12-02 DIAGNOSIS — X58XXXA Exposure to other specified factors, initial encounter: Secondary | ICD-10-CM | POA: Insufficient documentation

## 2012-12-02 DIAGNOSIS — M47812 Spondylosis without myelopathy or radiculopathy, cervical region: Secondary | ICD-10-CM

## 2012-12-02 DIAGNOSIS — M47817 Spondylosis without myelopathy or radiculopathy, lumbosacral region: Secondary | ICD-10-CM

## 2012-12-02 DIAGNOSIS — M47816 Spondylosis without myelopathy or radiculopathy, lumbar region: Secondary | ICD-10-CM

## 2012-12-02 MED ORDER — HYDROCODONE-ACETAMINOPHEN 5-325 MG PO TABS: 1.0000 | ORAL_TABLET | Freq: Four times a day (QID) | ORAL | Status: DC | PRN

## 2012-12-02 NOTE — Progress Notes (Signed)
Subjective:    Patient ID: [name], female    DOB: 1956-06-16, 57 y.o.   MRN: [id number]  HPI  Haevyn is back regarding her pain issues. She had L4-5 L5-S1 decompression stabilization by Dr. Yetta Barre last month and has done extremely well. She has been home and is working on a home exercise program. She is wearing a lumbar corset for comfort. She has some dull pain in her back and leg but nothing close to her pain before. She complains of bilateral hip pain that bothers her from time to time. Her neck pain is minimal and usually located in the low neck, most severe when she turns to the left or right at a sharp angle.  She is taking oxycodone for breakthrough pain and really doesn't feel that she needs it any longer.   Her vestibular symptoms have essentially resolved   Pain Inventory Average Pain 6 Pain Right Now 7 My pain is dull, tingling and aching  In the last 24 hours, has pain interfered with the following? General activity 8 Relation with others 8 Enjoyment of life 8 What TIME of day is your pain at its worst? all the time Sleep (in general) Poor  Pain is worse with: bending, sitting, standing, unsure and some activites Pain improves with: rest and medication Relief from Meds: 9  Mobility walk with assistance use a cane use a walker how many minutes can you walk? 15-20 ability to climb steps?  yes do you drive?  yes Do you have any goals in this area?  yes  Function not employed: date last employed 02/10 I need assistance with the following:  dressing, meal prep, household duties and shopping Do you have any goals in this area?  yes  Neuro/Psych weakness numbness tremor trouble walking spasms confusion depression anxiety loss of taste or smell  Prior Studies Had back surgery  Physicians involved in your care Any changes since last visit?  no   Family History  Problem Relation Age of Onset  . Cancer Mother     cervical  . Cancer Father    colon   History   Social History  . Marital Status: Married    Spouse Name: N/A    Number of Children: N/A  . Years of Education: N/A   Social History Main Topics  . Smoking status: Never Smoker   . Smokeless tobacco: Never Used  . Alcohol Use: No  . Drug Use: No  . Sexually Active: Yes   Other Topics Concern  . None   Social History Narrative  . None   Past Surgical History  Procedure Laterality Date  . Abdominal hysterectomy  1999  . Tonsillectomy  1981  . Tubal ligation  1997  . Bunions removed  1988  . Edg  12/17/2004  . Electrocardiogram  05/27/2007   Past Medical History  Diagnosis Date  . ANXIETY 06/10/2007  . ASYMPTOMATIC POSTMENOPAUSAL STATUS 07/19/2009  . Cough 04/02/2009  . DYSLIPIDEMIA 06/28/2008  . GERD 06/10/2007  . 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 HYPERGLYCEMIA 06/28/2008  . HYPERTENSION 06/10/2007  . Irritable bowel syndrome 06/28/2008  . OSTEOARTHRITIS 06/10/2007  . UPPER RESPIRATORY INFECTION 03/26/2009  . WEIGHT GAIN 01/21/2010   BP 124/69  Pulse 104  Resp 14  Ht 5\' 1"  (1.549 m)  Wt 169 lb (76.658 kg)  BMI 31.95 kg/m2  SpO2 96%     Review of Systems  Constitutional: Positive for appetite change and unexpected weight change.  Cardiovascular: Positive for leg swelling.  Musculoskeletal: Positive for back pain and gait problem.  Neurological: Positive for tremors, weakness and numbness.  Psychiatric/Behavioral: Positive for confusion and dysphoric mood. The patient is nervous/anxious.   All other systems reviewed and are negative.       Objective:   Physical Exam  Constitutional: She is oriented to person, place, and time. She appears well-developed and well-nourished. No distress HENT:  Head: Normocephalic and atraumatic.  Eyes: Conjunctivae and EOM are normal. Pupils are equal, round, and reactive to light.  Neck: Normal range of motion.  Cardiovascular: Normal rate.  Pulmonary/Chest: Effort normal and breath sounds normal.  Abdominal: Soft. Bowel  sounds are normal.  Musculoskeletal: Normal range of motion.  Back is somewhat tender to touch but much improved. She has improved range of motion and gait today. SLR was equivocal. She appear very comfortable. Neurological: She is alert and oriented to person, place, and time but was more distractible today and had some difficulty with attention. Balance is near normal. No cognitive or vestibular issues are seen.Skin: Skin is warm.  Psychiatric: She has a normal mood and affect, except for appearing a little down. Her behavior is normal. Judgment and thought content normal.  Assessment & Plan:   ASSESSMENT:  1. 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 Recent MRI notable for protruded disc at L4-5 and an extruded disc at L5-S1. She is now s/p L4-5 L5-S1 decompression stabilization with substantial mprovement of her back and right leg pain.  2. Left knee meniscus injury.  3. Depression with anxiety.  4. Cervical spondylosis/Myofascial pain.- under reasonable control at present 5. Concussion on 8/2 with post concussion syndrome   PLAN:  1. Discussed returning to physical activity to increase ROM, strength, aerobic capacity.  We discussed being reasonable about her activities however.  2. We reviewed her cervical CT and although there is significant disc disease at C5-6, her symptoms are minimal at this point. I would favor watching her clinically for now as she pursues increased activity, ROM, and strength exercises. 3. Hydrocodone will resume in place of her oxycodone as her pain levels are reduced. We will only use the 5/325 dosing. She will use up the remainder of her oxy IR 10mg  at the 1/2 tab dosing until they are gone 4.I'll see her back in about 3 month. 30 minutes of face to face patient care time were spent during this visit. All questions were encouraged and answered.

## 2012-12-02 NOTE — Patient Instructions (Addendum)
CONTINUE WITH YOUR EXERCISE TO INCREASE YOUR EXERCISE CAPACITY, STRENGTH AND RANGE OF MOTION

## 2012-12-14 ENCOUNTER — Other Ambulatory Visit: Payer: Self-pay | Admitting: Physical Medicine & Rehabilitation

## 2012-12-26 ENCOUNTER — Encounter: Payer: Self-pay | Admitting: Physical Medicine & Rehabilitation

## 2013-01-04 ENCOUNTER — Other Ambulatory Visit: Payer: Self-pay | Admitting: Physical Medicine & Rehabilitation

## 2013-01-04 ENCOUNTER — Telehealth: Payer: Self-pay

## 2013-01-04 MED ORDER — ALPRAZOLAM 0.5 MG PO TABS: 0.5000 mg | ORAL_TABLET | Freq: Three times a day (TID) | ORAL | Status: DC | PRN

## 2013-01-04 MED ORDER — HYDROCODONE-ACETAMINOPHEN 10-325 MG PO TABS: 1.0000 | ORAL_TABLET | Freq: Four times a day (QID) | ORAL | Status: DC | PRN

## 2013-01-04 NOTE — Telephone Encounter (Signed)
Patient called to get hydrocodone 10/325 and alprazolam 0.5 refilled.  Please call.

## 2013-01-04 NOTE — Telephone Encounter (Signed)
Notified refills done 

## 2013-01-05 ENCOUNTER — Encounter: Payer: Self-pay | Admitting: Endocrinology

## 2013-01-05 NOTE — Telephone Encounter (Signed)
This was called in

## 2013-01-06 NOTE — Telephone Encounter (Signed)
Mrs Cadena, I did call the medication in on the 26th. In fact I had to call them twice to order the xanax and make sure they understood that we were temporarily increasing your hydrocdone from 5-325 to 10-325.  [name], my coworker heard me make the calls.  I called over there this morning after seeing your email, and they do have the orders. THEY put them on hold because the last time you got your meds filled was on the 2nd of March and it "was too early to fill them". I am not sure why they told you they did not have the order, but they did.  I told them you needed to get them filled and to release them early, and they said it would be about an hour and you could pick them up.  I apologize for your inconvenience, but as I said, the orders were placed.

## 2013-01-11 ENCOUNTER — Other Ambulatory Visit: Payer: Self-pay | Admitting: Dermatology

## 2013-01-25 ENCOUNTER — Encounter: Payer: Self-pay | Admitting: Physical Medicine & Rehabilitation

## 2013-01-26 ENCOUNTER — Telehealth: Payer: Self-pay | Admitting: *Deleted

## 2013-01-26 ENCOUNTER — Other Ambulatory Visit: Payer: Self-pay | Admitting: Physical Medicine & Rehabilitation

## 2013-01-26 NOTE — Telephone Encounter (Signed)
[name] is requesting a refill on her Hydrocodone 10-325 because we gave her 90 instead of 120.  I am sure you said we could increase the dose to 10-325 but #90 but don't see an actual written note about the order. Do you want her to have the 120?( Her my chart message says : Please refill Hyrocodon-Acetaminophn 10-325. Last prescript was for 90 tabs, vs the usual 120 tabs. I have 10 left. Also note, had emergency appt with Dr. Marikay Alar on 4/11 for excruciating rt hip pain. Just finished steroid pk he prescribed. Said to continue pain meds by Dr. [name]. [facility] [phone]. Pls call to confirm! [phone]. BD: 09-23-2056. Thks, [place])

## 2013-01-27 ENCOUNTER — Other Ambulatory Visit: Payer: Self-pay | Admitting: Physical Medicine & Rehabilitation

## 2013-01-27 ENCOUNTER — Encounter: Payer: Self-pay | Admitting: Physical Medicine & Rehabilitation

## 2013-01-30 ENCOUNTER — Other Ambulatory Visit: Payer: Self-pay | Admitting: Physical Medicine & Rehabilitation

## 2013-01-30 ENCOUNTER — Encounter: Payer: Self-pay | Admitting: Physical Medicine & Rehabilitation

## 2013-01-30 ENCOUNTER — Telehealth: Payer: Self-pay | Admitting: *Deleted

## 2013-01-30 NOTE — Telephone Encounter (Signed)
Hydrocodone called in per Dr [name].  Patient aware.

## 2013-01-30 NOTE — Telephone Encounter (Signed)
[facility].  Patient aware via mychart.

## 2013-01-30 NOTE — Telephone Encounter (Signed)
Hydrocodone called in.  Patient informed via mychart.

## 2013-01-30 NOTE — Telephone Encounter (Signed)
We stayed at #90 because she was taking essentially 2-3 per day of the oxycodone by my recollection, and we just increased the dose of the hydrocodone.---however #120 are fine for now. We can call them in if you haven't already

## 2013-01-31 ENCOUNTER — Telehealth: Payer: Self-pay

## 2013-01-31 MED ORDER — CYCLOBENZAPRINE HCL 5 MG PO TABS: ORAL_TABLET | ORAL | Status: DC

## 2013-01-31 NOTE — Telephone Encounter (Signed)
[facility] requesting flexeril refill.  Verbal given.

## 2013-02-27 ENCOUNTER — Encounter: Payer: Self-pay | Admitting: Physical Medicine & Rehabilitation

## 2013-02-27 ENCOUNTER — Encounter: Payer: 59 | Attending: Physical Medicine & Rehabilitation | Admitting: Physical Medicine & Rehabilitation

## 2013-02-27 VITALS — BP 144/90 | HR 97 | Resp 14 | Ht 61.0 in | Wt 177.0 lb

## 2013-02-27 DIAGNOSIS — M1990 Unspecified osteoarthritis, unspecified site: Secondary | ICD-10-CM

## 2013-02-27 DIAGNOSIS — M47817 Spondylosis without myelopathy or radiculopathy, lumbosacral region: Secondary | ICD-10-CM

## 2013-02-27 DIAGNOSIS — Z5181 Encounter for therapeutic drug level monitoring: Secondary | ICD-10-CM

## 2013-02-27 DIAGNOSIS — F0781 Postconcussional syndrome: Secondary | ICD-10-CM

## 2013-02-27 DIAGNOSIS — M549 Dorsalgia, unspecified: Secondary | ICD-10-CM

## 2013-02-27 DIAGNOSIS — X58XXXA Exposure to other specified factors, initial encounter: Secondary | ICD-10-CM | POA: Insufficient documentation

## 2013-02-27 DIAGNOSIS — F329 Major depressive disorder, single episode, unspecified: Secondary | ICD-10-CM

## 2013-02-27 DIAGNOSIS — F3289 Other specified depressive episodes: Secondary | ICD-10-CM

## 2013-02-27 DIAGNOSIS — Z79899 Other long term (current) drug therapy: Secondary | ICD-10-CM

## 2013-02-27 DIAGNOSIS — M47812 Spondylosis without myelopathy or radiculopathy, cervical region: Secondary | ICD-10-CM

## 2013-02-27 MED ORDER — TRAZODONE HCL 100 MG PO TABS: 100.0000 mg | ORAL_TABLET | Freq: Every day | ORAL | Status: DC

## 2013-02-27 MED ORDER — HYDROCODONE-ACETAMINOPHEN 10-325 MG PO TABS: 1.0000 | ORAL_TABLET | Freq: Four times a day (QID) | ORAL | Status: DC | PRN

## 2013-02-27 NOTE — Patient Instructions (Signed)
CALL ME WITH ANY PROBLEMS OR QUESTIONS (#[phone]).  HAVE A GOOD DAY  

## 2013-02-27 NOTE — Progress Notes (Signed)
Subjective:    Patient ID: [name], female    DOB: 01/25/1956, 57 y.o.   MRN: [id number]  HPI  Ishia is back regarding her chronic back pain. She is disappointed that she is still having pain. She is getting depressed as a result. She remains on the effexor as I've presribed.  She is trying to walk daily but is limited due to pain and often anxiety. Her pain is often a sharp right low back pain with some radiation to the right side. She is doing a few stretches at home which she has done for some time.   Dr. Yetta Barre just fitted her with a bone stimulation to promote further healing at her op site. He did give her clearance to pursue full activity. She admits to being frustrated that she's not "better" already.   Pain Inventory Average Pain 7 Pain Right Now 7 My pain is sharp, burning, dull, stabbing, tingling and aching  In the last 24 hours, has pain interfered with the following? General activity 9 Relation with others 9 Enjoyment of life 10 What TIME of day is your pain at its worst? all the time Sleep (in general) Poor  Pain is worse with: walking, bending, sitting, inactivity, standing and some activites Pain improves with: medication Relief from Meds: 7  Mobility walk without assistance walk with assistance use a cane how many minutes can you walk? 10-60 ability to climb steps?  yes do you drive?  yes Do you have any goals in this area?  yes  Function not employed: date last employed 2010 disabled: date disabled . I need assistance with the following:  dressing, bathing, toileting, meal prep, household duties and shopping Do you have any goals in this area?  yes  Neuro/Psych weakness numbness tremor tingling trouble walking spasms dizziness confusion depression anxiety loss of taste or smell suicidal thoughts-no plan, just depressed about being in pain  Prior Studies Any changes since last visit?  yes x-rays  Physicians involved in your care Any  changes since last visit?  yes Tomasita Morrow   Family History  Problem Relation Age of Onset  . Cancer Mother     cervical  . Cancer Father     colon   History   Social History  . Marital Status: Married    Spouse Name: N/A    Number of Children: N/A  . Years of Education: N/A   Social History Main Topics  . Smoking status: Never Smoker   . Smokeless tobacco: Never Used  . Alcohol Use: No  . Drug Use: No  . Sexually Active: Yes   Other Topics Concern  . None   Social History Narrative  . None   Past Surgical History  Procedure Laterality Date  . Abdominal hysterectomy  1999  . Tonsillectomy  1981  . Tubal ligation  1997  . Bunions removed  1988  . Edg  12/17/2004  . Electrocardiogram  05/27/2007   Past Medical History  Diagnosis Date  . ANXIETY 06/10/2007  . ASYMPTOMATIC POSTMENOPAUSAL STATUS 07/19/2009  . Cough 04/02/2009  . DYSLIPIDEMIA 06/28/2008  . GERD 06/10/2007  . 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 HYPERGLYCEMIA 06/28/2008  . HYPERTENSION 06/10/2007  . Irritable bowel syndrome 06/28/2008  . OSTEOARTHRITIS 06/10/2007  . UPPER RESPIRATORY INFECTION 03/26/2009  . WEIGHT GAIN 01/21/2010   BP 144/90  Pulse 97  Resp 14  Ht 5\' 1"  (1.549 m)  Wt 177 lb (80.287 kg)  BMI 33.46 kg/m2  SpO2 100%  Review of Systems  Constitutional: Positive for diaphoresis, appetite change and unexpected weight change.  Respiratory: Positive for cough and shortness of breath.   Musculoskeletal: Positive for back pain.  Neurological: Positive for dizziness, tremors, weakness and numbness.  Psychiatric/Behavioral: Positive for suicidal ideas, confusion and dysphoric mood. The patient is nervous/anxious.   All other systems reviewed and are negative.       Objective:   Physical Exam Constitutional: She is oriented to person, place, and time. She appears well-developed and well-nourished. No distress  HENT:  Head: Normocephalic and atraumatic.  Eyes: Conjunctivae and EOM are normal. Pupils  are equal, round, and reactive to light.  Neck: Normal range of motion.  Cardiovascular: Normal rate.  Pulmonary/Chest: Effort normal and breath sounds normal.  Abdominal: Soft. Bowel sounds are normal.  Musculoskeletal: Normal range of motion.  Back is less tender. She bent to 90 degrees and it "felt good". She had m ore pain with extension and facet maneuver. Pain seemed to be above surgical site.  SLR was equivocal. She appear very comfortable.   Neurological: She is alert and oriented to person, place, and time but was more distractible today and had some difficulty with attention. Balance is near normal. No cognitive or vestibular issues are seen.Skin: Skin is warm.  Psychiatric: Her mood is pleasant, but she's.  Her behavior is normal. Judgment and thought content normal.  Assessment & Plan:   ASSESSMENT:  1. 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 Recent MRI notable for protruded disc at L4-5 and an extruded disc at L5-S1. She is now s/p L4-5 L5-S1 decompression stabilization with substantial mprovement of her back and right leg pain.  2. Left knee meniscus injury.  3. Depression with anxiety.  4. Cervical spondylosis/Myofascial pain.- under reasonable control at present  5. Concussion on 8/2 with post concussion syndrome    PLAN:  1. Discussed returning to physical activity to increase ROM, strength, aerobic capacity. Pilates principles were provided. I think she would do well with aquatic based activities, including walking and aerobics. A stationary bike would also be an option. We also discussed further community integration as well.  2. Made a referral to Dr. Vickii Chafe for pain and anxiety. She needs to have better coping strategies for her pain. She also has anxiety attacks with exacerbate her pain. .  3. Hydrocodone will continue for breakthrough pain.  4. Will increase trazodone to 100mg  to help her sleep and perhaps give her  mood a boost.  5.I'll see her back in about 3 month. 30 minutes of face to face patient care time were spent during this visit. All questions were encouraged and answered.

## 2013-03-27 ENCOUNTER — Other Ambulatory Visit: Payer: Self-pay | Admitting: Physical Medicine & Rehabilitation

## 2013-04-01 ENCOUNTER — Other Ambulatory Visit: Payer: Self-pay | Admitting: Physical Medicine & Rehabilitation

## 2013-04-03 NOTE — Telephone Encounter (Signed)
Alprazolam refill called to pharmacy. 

## 2013-04-06 ENCOUNTER — Other Ambulatory Visit: Payer: Self-pay | Admitting: Endocrinology

## 2013-04-06 ENCOUNTER — Encounter: Payer: Self-pay | Admitting: Physical Medicine & Rehabilitation

## 2013-04-06 ENCOUNTER — Encounter: Payer: Self-pay | Admitting: Endocrinology

## 2013-04-06 DIAGNOSIS — F3289 Other specified depressive episodes: Secondary | ICD-10-CM

## 2013-04-06 DIAGNOSIS — F329 Major depressive disorder, single episode, unspecified: Secondary | ICD-10-CM

## 2013-04-18 ENCOUNTER — Other Ambulatory Visit: Payer: Self-pay | Admitting: *Deleted

## 2013-04-18 ENCOUNTER — Other Ambulatory Visit: Payer: Self-pay | Admitting: Endocrinology

## 2013-04-18 MED ORDER — ACYCLOVIR 800 MG PO TABS: 800.0000 mg | ORAL_TABLET | Freq: Every day | ORAL | Status: DC

## 2013-05-17 ENCOUNTER — Other Ambulatory Visit: Payer: Self-pay | Admitting: Dermatology

## 2013-05-22 ENCOUNTER — Other Ambulatory Visit: Payer: Self-pay | Admitting: Physical Medicine & Rehabilitation

## 2013-05-30 ENCOUNTER — Encounter: Payer: Self-pay | Admitting: Physical Medicine & Rehabilitation

## 2013-05-30 ENCOUNTER — Encounter: Payer: 59 | Attending: Physical Medicine & Rehabilitation | Admitting: Physical Medicine & Rehabilitation

## 2013-05-30 DIAGNOSIS — F3289 Other specified depressive episodes: Secondary | ICD-10-CM

## 2013-05-30 DIAGNOSIS — M47817 Spondylosis without myelopathy or radiculopathy, lumbosacral region: Secondary | ICD-10-CM

## 2013-05-30 DIAGNOSIS — M1990 Unspecified osteoarthritis, unspecified site: Secondary | ICD-10-CM | POA: Insufficient documentation

## 2013-05-30 DIAGNOSIS — F329 Major depressive disorder, single episode, unspecified: Secondary | ICD-10-CM | POA: Insufficient documentation

## 2013-05-30 DIAGNOSIS — F0781 Postconcussional syndrome: Secondary | ICD-10-CM | POA: Insufficient documentation

## 2013-05-30 DIAGNOSIS — F411 Generalized anxiety disorder: Secondary | ICD-10-CM | POA: Insufficient documentation

## 2013-05-30 DIAGNOSIS — X58XXXA Exposure to other specified factors, initial encounter: Secondary | ICD-10-CM | POA: Insufficient documentation

## 2013-05-30 DIAGNOSIS — M47816 Spondylosis without myelopathy or radiculopathy, lumbar region: Secondary | ICD-10-CM

## 2013-05-30 MED ORDER — METHYLPHENIDATE HCL 5 MG PO TABS: 5.0000 mg | ORAL_TABLET | Freq: Two times a day (BID) | ORAL | Status: DC

## 2013-05-30 MED ORDER — AMITRIPTYLINE HCL 10 MG PO TABS: 10.0000 mg | ORAL_TABLET | Freq: Every day | ORAL | Status: DC

## 2013-05-30 MED ORDER — HYDROCODONE-ACETAMINOPHEN 10-325 MG PO TABS: ORAL_TABLET | ORAL | Status: DC

## 2013-05-30 NOTE — Patient Instructions (Signed)
STOP TRAZODONE FOR NOW  TRY ELAVIL 10MG  AT BED. MAY TAKE 20MG  IF NEEDED.  TRY MELATONIN OVER THE COUNTER FOR SLEEP ARCHITECTURE 3MG  TO 8MG  AT BEDTIME.

## 2013-05-30 NOTE — Progress Notes (Signed)
Subjective:    Patient ID: [name], female    DOB: Aug 01, 1956, 57 y.o.   MRN: [id number]  HPI  [name] is back regarding her chronic pain. She has concerns over her memory, concentration, headaches, depression. She's also had persistent balance problems. Her back and neck continue to bother her also, although she is having generalized pain. Dr. Yetta Barre saw her recently and ordered a scan of her C-spine, the results of which she has not heard about.   She never saw Dr. Zenda Alpers because of insurance covg issues. Her mood has been very poor as a whole because of the multiple issues above. She has thought about "ending her life" but has never sincerely thought about going through with something like that. She doesn't know how to get out of this "rutt" she is in.   She has noticed that she often is losing track of what she's doing around home or if she's driving. Sometimes she's afraid to interact with others based on how "out of it" she feels.   The trazodone helped her sleep but seeed to give her nightmares. She is drinking some teal which does tend to relax her.      Pain Inventory Average Pain 6 Pain Right Now 6 My pain is sharp, burning, dull, stabbing, tingling and aching  In the last 24 hours, has pain interfered with the following? General activity 10 Relation with others 10 Enjoyment of life 10 What TIME of day is your pain at its worst? constant Sleep (in general) Poor  Pain is worse with: walking, bending, sitting, inactivity, standing and some activites Pain improves with: medication Relief from Meds: 5  Mobility use a cane how many minutes can you walk? 20 ability to climb steps?  no do you drive?  yes Do you have any goals in this area?  no  Function disabled: date disabled 2014 I need assistance with the following:  dressing, bathing, meal prep, household duties and shopping Do you have any goals in this area?  no  Neuro/Psych bladder control  problems weakness numbness tremor tingling trouble walking spasms dizziness confusion depression anxiety loss of taste or smell suicidal thoughts-talks to minister  Prior Studies Any changes since last visit?  yes x-rays CT/MRI  Physicians involved in your care Any changes since last visit?  yes Dr Everardo All, Dr Yetta Barre, Gertie Gowda, Lowella Grip   Family History  Problem Relation Age of Onset  . Cancer Mother     cervical  . Cancer Father     colon   History   Social History  . Marital Status: Married    Spouse Name: N/A    Number of Children: N/A  . Years of Education: N/A   Social History Main Topics  . Smoking status: Never Smoker   . Smokeless tobacco: Never Used  . Alcohol Use: No  . Drug Use: No  . Sexual Activity: Yes   Other Topics Concern  . None   Social History Narrative  . None   Past Surgical History  Procedure Laterality Date  . Abdominal hysterectomy  1999  . Tonsillectomy  1981  . Tubal ligation  1997  . Bunions removed  1988  . Edg  12/17/2004  . Electrocardiogram  05/27/2007  . Skin cancer excision     Past Medical History  Diagnosis Date  . ANXIETY 06/10/2007  . ASYMPTOMATIC POSTMENOPAUSAL STATUS 07/19/2009  . Cough 04/02/2009  . DYSLIPIDEMIA 06/28/2008  . GERD 06/10/2007  . 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  HYPERGLYCEMIA 06/28/2008  . HYPERTENSION 06/10/2007  . Irritable bowel syndrome 06/28/2008  . OSTEOARTHRITIS 06/10/2007  . UPPER RESPIRATORY INFECTION 03/26/2009  . WEIGHT GAIN 01/21/2010   There were no vitals taken for this visit.     Review of Systems  Constitutional: Positive for diaphoresis and appetite change.  Respiratory: Positive for shortness of breath.   Gastrointestinal: Positive for nausea, abdominal pain and diarrhea.  Musculoskeletal: Positive for myalgias, back pain and arthralgias.  Neurological: Positive for dizziness, tremors, weakness and numbness.  Psychiatric/Behavioral: Positive for suicidal ideas, confusion and  dysphoric mood. The patient is nervous/anxious.   All other systems reviewed and are negative.       Objective:   Physical Exam Constitutional: She is oriented to person, place, and time. She appears well-developed and well-nourished. No distress  HENT:  Head: Normocephalic and atraumatic.  Eyes: Conjunctivae and EOM are normal. Pupils are equal, round, and reactive to light.  Neck: Normal range of motion.  Cardiovascular: Normal rate.  Pulmonary/Chest: Effort normal and breath sounds normal.  Abdominal: Soft. Bowel sounds are normal.  Musculoskeletal: Normal range of motion.  Back is less tender.  She had m ore pain with extension and facet maneuver. Pain seemed to be above surgical site. SLR was equivocal. She appear very comfortable.  Neurological: She is alert and oriented to person, place, time, but remains distractible today and had persistent difficulty with attention. She remembered 3/3 words after 5 minutes. She could only perform 1/3 serial 7's. She needed help with simple number sequencing. She did better with word sequencing. Abstract thinking was fair to normal.  Balance was still a little off today.   Skin: Skin is warm.  Psychiatric: Her mood is pleasant, but she appears depressed and often anxious.   Assessment & Plan:   ASSESSMENT:  1. 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 Recent MRI notable for protruded disc at L4-5 and an extruded disc at L5-S1. She is now s/p L4-5 L5-S1 decompression stabilization with substantial mprovement of her back and right leg pain.  2. Left knee meniscus injury.  3. Depression with anxiety.  4. Cervical spondylosis/Myofascial pain.- under reasonable control at present  5. Concussion on 8/2 with post concussion syndrome- she has ongoing memory and concentration deficits.     PLAN:  1. MRI of the cervical spine per Dr. Yetta Barre.  2. Will make a referral to speech language pathology to assess basic  cognition, memory and work on adaptation skills for deficits. Ultimately, she needs neuropsych assessment, but we had difficulties with insurance covg---may have to appeal this decision, if we run into ongoing road-blocks 3. Hydrocodone will continue for breakthrough pain. Don't need to increase 4. In place of trazodone which may be contributing to nightmares we will try low dose elavil 10-20mg  qhs. Also introduce melatonin to assist with sleep quality.  5. Will introduce ritalin to assist with attention and concentration as well as energy. This may help boost her mood as well.  6 .I'll see her back in about 3 month. 30 minutes of face to face patient care time were spent during this visit. All questions were encouraged and answered.

## 2013-06-14 ENCOUNTER — Encounter: Payer: 59 | Admitting: Speech Pathology

## 2013-06-19 ENCOUNTER — Ambulatory Visit: Payer: 59 | Admitting: Speech Pathology

## 2013-06-20 ENCOUNTER — Other Ambulatory Visit: Payer: Self-pay | Admitting: Physical Medicine & Rehabilitation

## 2013-06-21 NOTE — Telephone Encounter (Signed)
Hydrocodone called to pharmacy in response to electronic request for refill. Last fill was documented as 05/23/13.  Our records indicates an rx was ordered at visit on 05/30/13 but was on "phone in" and pharmacy has no record of it so it was not filled, so refill is appropriate.

## 2013-06-22 ENCOUNTER — Ambulatory Visit: Payer: 59 | Attending: Physical Medicine & Rehabilitation

## 2013-06-22 DIAGNOSIS — R41841 Cognitive communication deficit: Secondary | ICD-10-CM | POA: Insufficient documentation

## 2013-06-22 DIAGNOSIS — IMO0001 Reserved for inherently not codable concepts without codable children: Secondary | ICD-10-CM | POA: Insufficient documentation

## 2013-06-22 DIAGNOSIS — S069XAS Unspecified intracranial injury with loss of consciousness status unknown, sequela: Secondary | ICD-10-CM | POA: Insufficient documentation

## 2013-06-22 DIAGNOSIS — S069X9S Unspecified intracranial injury with loss of consciousness of unspecified duration, sequela: Secondary | ICD-10-CM | POA: Insufficient documentation

## 2013-06-22 DIAGNOSIS — IMO0002 Reserved for concepts with insufficient information to code with codable children: Secondary | ICD-10-CM | POA: Insufficient documentation

## 2013-06-23 ENCOUNTER — Encounter: Payer: Self-pay | Admitting: Endocrinology

## 2013-06-27 ENCOUNTER — Encounter: Payer: Self-pay | Admitting: Physical Medicine & Rehabilitation

## 2013-06-27 ENCOUNTER — Encounter: Payer: 59 | Attending: Physical Medicine & Rehabilitation | Admitting: Physical Medicine & Rehabilitation

## 2013-06-27 VITALS — BP 137/73 | HR 93 | Resp 16 | Ht 61.0 in | Wt 167.6 lb

## 2013-06-27 DIAGNOSIS — M47817 Spondylosis without myelopathy or radiculopathy, lumbosacral region: Secondary | ICD-10-CM

## 2013-06-27 DIAGNOSIS — F411 Generalized anxiety disorder: Secondary | ICD-10-CM

## 2013-06-27 DIAGNOSIS — F3289 Other specified depressive episodes: Secondary | ICD-10-CM

## 2013-06-27 DIAGNOSIS — F329 Major depressive disorder, single episode, unspecified: Secondary | ICD-10-CM

## 2013-06-27 DIAGNOSIS — M47812 Spondylosis without myelopathy or radiculopathy, cervical region: Secondary | ICD-10-CM

## 2013-06-27 DIAGNOSIS — M47816 Spondylosis without myelopathy or radiculopathy, lumbar region: Secondary | ICD-10-CM

## 2013-06-27 DIAGNOSIS — F0781 Postconcussional syndrome: Secondary | ICD-10-CM | POA: Insufficient documentation

## 2013-06-27 DIAGNOSIS — M1990 Unspecified osteoarthritis, unspecified site: Secondary | ICD-10-CM | POA: Insufficient documentation

## 2013-06-27 DIAGNOSIS — X58XXXA Exposure to other specified factors, initial encounter: Secondary | ICD-10-CM | POA: Insufficient documentation

## 2013-06-27 MED ORDER — HYDROCODONE-ACETAMINOPHEN 10-325 MG PO TABS: ORAL_TABLET | ORAL | Status: DC

## 2013-06-27 MED ORDER — METHYLPHENIDATE HCL 5 MG PO TABS: 5.0000 mg | ORAL_TABLET | Freq: Two times a day (BID) | ORAL | Status: DC

## 2013-06-27 NOTE — Progress Notes (Signed)
Subjective:    Patient ID: [name], female    DOB: [birth year], 57 y.o.   MRN: [id number]  HPI  [name] is back regarding her chronic issues. She had benefit with the elavil for sleep and mood. The ritalin has helped with her energy levels, anxiety. She is very pleased with the results.   She continues with the hydrocodone which helps with her breakthrough pain.   Dr. Yetta Barre has her set up for a cervical ACDF tomorrow!. This is being done at the surgical center. She is a little apprehensive but looks forward to having better ROM and pain control in the neck and shoulders.   From a balance standpoint she is still using a cane. Therapy is working on her vestibular deficits.     Pain Inventory Average Pain 7 Pain Right Now 6 My pain is sharp, burning, dull, stabbing, tingling, aching and other  In the last 24 hours, has pain interfered with the following? General activity 10 Relation with others 10 Enjoyment of life 10 What TIME of day is your pain at its worst? all day Sleep (in general) Good  Pain is worse with: walking, bending, sitting, standing, some activites and other Pain improves with: rest, heat/ice, pacing activities, medication and other Relief from Meds: 8  Mobility walk with assistance use a cane how many minutes can you walk? 20-30 do you drive?  yes Do you have any goals in this area?  yes  Function not employed: date last employed na I need assistance with the following:  meal prep, household duties and shopping Do you have any goals in this area?  yes  Neuro/Psych weakness numbness tremor tingling trouble walking spasms dizziness confusion depression anxiety loss of taste or smell  Prior Studies Any changes since last visit?  yes x-rays CT/MRI  Physicians involved in your care Any changes since last visit?  yes other mental therapist   Family History  Problem Relation Age of Onset  . Cancer Mother     cervical  . Cancer Father     colon   History   Social History  . Marital Status: Married    Spouse Name: N/A    Number of Children: N/A  . Years of Education: N/A   Social History Main Topics  . Smoking status: Never Smoker   . Smokeless tobacco: Never Used  . Alcohol Use: No  . Drug Use: No  . Sexual Activity: Yes   Other Topics Concern  . None   Social History Narrative  . None   Past Surgical History  Procedure Laterality Date  . Abdominal hysterectomy  1999  . Tonsillectomy  1981  . Tubal ligation  1997  . Bunions removed  1988  . Edg  12/17/2004  . Electrocardiogram  05/27/2007  . Skin cancer excision     Past Medical History  Diagnosis Date  . ANXIETY 06/10/2007  . ASYMPTOMATIC POSTMENOPAUSAL STATUS 07/19/2009  . Cough 04/02/2009  . DYSLIPIDEMIA 06/28/2008  . GERD 06/10/2007  . 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 HYPERGLYCEMIA 06/28/2008  . HYPERTENSION 06/10/2007  . Irritable bowel syndrome 06/28/2008  . OSTEOARTHRITIS 06/10/2007  . UPPER RESPIRATORY INFECTION 03/26/2009  . WEIGHT GAIN 01/21/2010   BP 137/73  Pulse 93  Resp 16  Ht 5\' 1"  (1.549 m)  Wt 167 lb 9.6 oz (76.023 kg)  BMI 31.68 kg/m2  SpO2 98%     Review of Systems  Constitutional: Positive for diaphoresis and appetite change.  HENT:  Loss of taste or smell  Cardiovascular: Positive for leg swelling.  Musculoskeletal: Positive for gait problem.  Neurological: Positive for dizziness, tremors, weakness and numbness.       Tingling, spasms  Psychiatric/Behavioral: Positive for confusion and dysphoric mood. The patient is nervous/anxious.        Objective:   Physical Exam  Constitutional: She is oriented to person, place, and time. She appears well-developed and well-nourished. No distress  HENT:  Head: Normocephalic and atraumatic.  Eyes: Conjunctivae and EOM are normal. Pupils are equal, round, and reactive to light.  Neck: Normal range of motion.  Cardiovascular: Normal rate.  Pulmonary/Chest: Effort normal and breath sounds  normal.  Abdominal: Soft. Bowel sounds are normal.  Musculoskeletal: Normal range of motion.  Neck rom slightly limited. Back tender to palpation and flexion  Neurological: memory and attention are better. Strength symmetrical..  Skin: Skin is warm.  Psychiatric: Her mood is pleasant and she's in good spirits.    Assessment & Plan:   ASSESSMENT:  1. 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 Recent MRI notable for protruded disc at L4-5 and an extruded disc at L5-S1. She is now s/p L4-5 L5-S1 decompression stabilization with substantial mprovement of her back and right leg pain.  2. Left knee meniscus injury.  3. Depression with anxiety which has responded to the elavil and ritalin.  4. Cervical spondylosis/Myofascial pain.- see below 5. Concussion on 8/2 with post concussion syndrome- she has ongoing memory and concentration deficits.   PLAN:  1. Cervical ACDF per Dr. Yetta Barre tomorrow.  2. Continue outpt therapies as tolerated post-operatively. She will be limited with vestibular rx with a cervical collar in place 3. Hydrocodone will continue for breakthrough pain. She may take oxycodone if needed post op per Dr. Yetta Barre for the acute period 4. Elavil 10-20mg  qhs for sleep, mood, pain. Melatonin to assist with sleep architecture  5. Ritalin will continue for arousal and attention..  6 .I'll see her back in about 3 months. 30 minutes of face to face patient care time were spent during this visit. All questions were encouraged and answered.

## 2013-06-27 NOTE — Patient Instructions (Signed)
CALL ME WITH ANY PROBLEMS OR QUESTIONS (#[phone]).  HAVE A GOOD DAY  

## 2013-06-28 HISTORY — PX: OTHER SURGICAL HISTORY: SHX169

## 2013-06-29 ENCOUNTER — Other Ambulatory Visit: Payer: Self-pay | Admitting: Physical Medicine & Rehabilitation

## 2013-07-03 ENCOUNTER — Ambulatory Visit: Payer: 59

## 2013-07-06 ENCOUNTER — Ambulatory Visit: Payer: 59 | Admitting: Speech Pathology

## 2013-07-10 ENCOUNTER — Ambulatory Visit: Payer: 59 | Admitting: Speech Pathology

## 2013-07-13 ENCOUNTER — Ambulatory Visit: Payer: 59 | Attending: Physical Medicine & Rehabilitation

## 2013-07-13 DIAGNOSIS — IMO0001 Reserved for inherently not codable concepts without codable children: Secondary | ICD-10-CM | POA: Insufficient documentation

## 2013-07-13 DIAGNOSIS — R41841 Cognitive communication deficit: Secondary | ICD-10-CM | POA: Insufficient documentation

## 2013-07-18 ENCOUNTER — Other Ambulatory Visit: Payer: Self-pay | Admitting: Endocrinology

## 2013-07-19 ENCOUNTER — Ambulatory Visit: Payer: 59

## 2013-07-21 ENCOUNTER — Ambulatory Visit: Payer: 59

## 2013-07-25 ENCOUNTER — Other Ambulatory Visit: Payer: Self-pay

## 2013-07-25 ENCOUNTER — Ambulatory Visit: Payer: 59 | Admitting: Speech Pathology

## 2013-07-25 DIAGNOSIS — Z1231 Encounter for screening mammogram for malignant neoplasm of breast: Secondary | ICD-10-CM

## 2013-07-26 ENCOUNTER — Ambulatory Visit (INDEPENDENT_AMBULATORY_CARE_PROVIDER_SITE_OTHER): Payer: 59 | Admitting: Endocrinology

## 2013-07-26 ENCOUNTER — Encounter: Payer: Self-pay | Admitting: Endocrinology

## 2013-07-26 VITALS — BP 126/80 | HR 90 | Ht 61.0 in | Wt 167.0 lb

## 2013-07-26 DIAGNOSIS — Z0000 Encounter for general adult medical examination without abnormal findings: Secondary | ICD-10-CM

## 2013-07-26 DIAGNOSIS — E785 Hyperlipidemia, unspecified: Secondary | ICD-10-CM

## 2013-07-26 DIAGNOSIS — F411 Generalized anxiety disorder: Secondary | ICD-10-CM

## 2013-07-26 DIAGNOSIS — Z23 Encounter for immunization: Secondary | ICD-10-CM

## 2013-07-26 DIAGNOSIS — I10 Essential (primary) hypertension: Secondary | ICD-10-CM

## 2013-07-26 DIAGNOSIS — E039 Hypothyroidism, unspecified: Secondary | ICD-10-CM

## 2013-07-26 DIAGNOSIS — R7309 Other abnormal glucose: Secondary | ICD-10-CM

## 2013-07-26 DIAGNOSIS — Z79899 Other long term (current) drug therapy: Secondary | ICD-10-CM

## 2013-07-26 NOTE — Progress Notes (Signed)
Subjective:    Patient ID: [name], female    DOB: 1956/08/03, 57 y.o.   MRN: [id number]  HPI Pt is here for regular wellness examination, and is feeling pretty well in general, and says chronic med probs are stable, except as noted below Past Medical History  Diagnosis Date  . ANXIETY 06/10/2007  . ASYMPTOMATIC POSTMENOPAUSAL STATUS 07/19/2009  . Cough 04/02/2009  . DYSLIPIDEMIA 06/28/2008  . GERD 06/10/2007  . 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 HYPERGLYCEMIA 06/28/2008  . HYPERTENSION 06/10/2007  . Irritable bowel syndrome 06/28/2008  . OSTEOARTHRITIS 06/10/2007  . UPPER RESPIRATORY INFECTION 03/26/2009  . WEIGHT GAIN 01/21/2010    Past Surgical History  Procedure Laterality Date  . Abdominal hysterectomy  1999  . Tonsillectomy  1981  . Tubal ligation  1997  . Bunions removed  1988  . Edg  12/17/2004  . Electrocardiogram  05/27/2007  . Skin cancer excision      History   Social History  . Marital Status: Married    Spouse Name: N/A    Number of Children: N/A  . Years of Education: N/A   Occupational History  . Not on file.   Social History Main Topics  . Smoking status: Never Smoker   . Smokeless tobacco: Never Used  . Alcohol Use: No  . Drug Use: No  . Sexual Activity: Yes   Other Topics Concern  . Not on file   Social History Narrative  . No narrative on file    Current Outpatient Prescriptions on File Prior to Visit  Medication Sig Dispense Refill  . acyclovir (ZOVIRAX) 800 MG tablet TAKE 1 TABLET DAILY  90 tablet  0  . ALPRAZolam (XANAX) 0.5 MG tablet TAKE 1 TABLET 3 TIMES A Annette AS NEEDED FOR ANXIETY  90 tablet  2  . amitriptyline (ELAVIL) 10 MG tablet Take 1-2 tablets (10-20 mg total) by mouth at bedtime.  60 tablet  3  . atorvastatin (LIPITOR) 40 MG tablet Take 1 tablet (40 mg total) by mouth daily.  30 tablet  11  . cyclobenzaprine (FLEXERIL) 5 MG tablet TAKE 1 TABLET BY MOUTH EVERY 8 HOURS AS NEEDED  60 tablet  1  . fluorouracil (EFUDEX) 5 % cream Apply topically 2  (two) times daily.      Marland Kitchen HYDROcodone-acetaminophen (NORCO) 10-325 MG per tablet TAKE 1 TABLET EVERY 6 HOURS AS NEEDED FOR PAIN  120 tablet  0  . lisinopril-hydrochlorothiazide (PRINZIDE,ZESTORETIC) 10-12.5 MG per tablet Take 0.5 tablets by mouth daily.      . meclizine (ANTIVERT) 12.5 MG tablet       . methylphenidate (RITALIN) 5 MG tablet Take 1 tablet (5 mg total) by mouth 2 (two) times daily. At 7am and 12 noon daily  60 tablet  0  . omeprazole (PRILOSEC) 40 MG capsule Take 1 capsule (40 mg total) by mouth daily.  30 capsule  11  . Venlafaxine HCl 225 MG TB24 Take 1 tablet (225 mg total) by mouth daily.  90 tablet  3   No current facility-administered medications on file prior to visit.    No Known Allergies  Family History  Problem Relation Age of Onset  . Cancer Mother     cervical  . Cancer Father     colon    BP 126/80  Pulse 90  Ht 5\' 1"  (1.549 m)  Wt 167 lb (75.751 kg)  BMI 31.57 kg/m2  SpO2 99%     Review of Systems  Constitutional: Positive for fatigue.  Negative for unexpected weight change.  HENT: Negative for hearing loss.   Eyes: Negative for visual disturbance.  Respiratory: Negative for shortness of breath.   Cardiovascular: Negative for chest pain.  Gastrointestinal: Negative for anal bleeding.  Endocrine: Negative for cold intolerance.  Genitourinary: Negative for hematuria.  Musculoskeletal:       No change in chronic low-back pain  Skin: Negative for rash.  Allergic/Immunologic: Positive for environmental allergies.  Neurological: Negative for syncope.  Psychiatric/Behavioral: Positive for dysphoric mood.       Objective:   Physical Exam VS: see vs page GEN: no distress HEAD: head: no deformity eyes: no periorbital swelling, no proptosis external nose and ears are normal mouth: no lesion seen NECK: a healed scar is present (c-spine surgery).  i do not appreciate a nodule in the thyroid or elsewhere in the neck CHEST WALL: no  deformity LUNGS:  Clear to auscultation BREASTS:  sees gyn CV: reg rate and rhythm, no murmur ABD: abdomen is soft, nontender.  no hepatosplenomegaly.  not distended.  no hernia GENITALIA/RECTAL: sees gyn MUSCULOSKELETAL: muscle bulk and strength are grossly normal.  no obvious joint swelling.  gait is normal and steady.  Old healed surgical scar at the lower back EXTEMITIES: no deformity.  no ulcer on the feet.  feet are of normal color and temp.  no edema.  Old healed surgical scars on both feet.   PULSES: dorsalis pedis intact bilat.  no carotid bruit NEURO:  cn 2-12 grossly intact.   readily moves all 4's.  sensation is intact to touch on the feet SKIN:  Normal texture and temperature.  No rash or suspicious lesion is visible.   NODES:  None palpable at the neck PSYCH: alert, oriented x3.  Does not appear anxious nor depressed.   i reviewed electrocardiogram    Assessment & Plan:  Wellness visit today, with problems stable, except as noted. we discussed code status.  pt requests full code, but would not want to be started or maintained on artificial life-support measures if there was not a reasonable chance of recovery.

## 2013-07-26 NOTE — Patient Instructions (Signed)
please consider these measures for your health:  minimize alcohol.  do not use tobacco products.  have a colonoscopy at least every 10 years from age 57.  Women should have an annual mammogram from age 13.  keep firearms safely stored.  always use seat belts.  have working smoke alarms in your home.  see an eye doctor and dentist regularly.  never drive under the influence of alcohol or drugs (including prescription drugs).  those with fair skin should take precautions against the sun. Please return in 1 year. blood tests are being requested for you today.  We'll contact you with results.

## 2013-07-27 ENCOUNTER — Ambulatory Visit: Payer: 59 | Admitting: Speech Pathology

## 2013-07-27 LAB — CBC WITH DIFFERENTIAL/PLATELET
Basophils Absolute: 0 10*3/uL (ref 0.0–0.1)
Basophils Relative: 1 % (ref 0.0–3.0)
Eosinophils Relative: 2.1 % (ref 0.0–5.0)
HCT: 35.8 % — ABNORMAL LOW (ref 36.0–46.0)
Lymphs Abs: 1.7 10*3/uL (ref 0.7–4.0)
MCHC: 33.9 g/dL (ref 30.0–36.0)
MCV: 79.7 fl (ref 78.0–100.0)
Monocytes Absolute: 0.4 10*3/uL (ref 0.1–1.0)
Monocytes Relative: 7.3 % (ref 3.0–12.0)
RBC: 4.49 Mil/uL (ref 3.87–5.11)
WBC: 4.9 10*3/uL (ref 4.5–10.5)

## 2013-07-27 LAB — HEPATIC FUNCTION PANEL
AST: 26 U/L (ref 0–37)
Albumin: 4.1 g/dL (ref 3.5–5.2)
Alkaline Phosphatase: 85 U/L (ref 39–117)
Bilirubin, Direct: 0 mg/dL (ref 0.0–0.3)
Total Bilirubin: 0.3 mg/dL (ref 0.3–1.2)
Total Protein: 6.8 g/dL (ref 6.0–8.3)

## 2013-07-27 LAB — BASIC METABOLIC PANEL
Calcium: 9.3 mg/dL (ref 8.4–10.5)
GFR: 79.94 mL/min (ref >60.00)
Glucose, Bld: 106 mg/dL — ABNORMAL HIGH (ref 70–99)
Potassium: 4.4 mEq/L (ref 3.5–5.1)
Sodium: 140 mEq/L (ref 135–145)

## 2013-07-27 LAB — LIPID PANEL
HDL: 50.2 mg/dL (ref >39.00)
Total CHOL/HDL Ratio: 4
Triglycerides: 348 mg/dL — ABNORMAL HIGH (ref 0.0–149.0)

## 2013-07-27 LAB — HEMOGLOBIN A1C: Hgb A1c MFr Bld: 6.1 % (ref 4.6–6.5)

## 2013-07-27 LAB — LDL CHOLESTEROL, DIRECT: Direct LDL: 106.1 mg/dL

## 2013-07-28 ENCOUNTER — Telehealth: Payer: Self-pay

## 2013-07-28 DIAGNOSIS — F411 Generalized anxiety disorder: Secondary | ICD-10-CM

## 2013-07-28 DIAGNOSIS — F329 Major depressive disorder, single episode, unspecified: Secondary | ICD-10-CM

## 2013-07-28 DIAGNOSIS — F3289 Other specified depressive episodes: Secondary | ICD-10-CM

## 2013-07-28 DIAGNOSIS — M1990 Unspecified osteoarthritis, unspecified site: Secondary | ICD-10-CM

## 2013-07-28 DIAGNOSIS — M47816 Spondylosis without myelopathy or radiculopathy, lumbar region: Secondary | ICD-10-CM

## 2013-07-28 DIAGNOSIS — F0781 Postconcussional syndrome: Secondary | ICD-10-CM

## 2013-07-28 DIAGNOSIS — M47817 Spondylosis without myelopathy or radiculopathy, lumbosacral region: Secondary | ICD-10-CM

## 2013-07-28 MED ORDER — METHYLPHENIDATE HCL 5 MG PO TABS: 5.0000 mg | ORAL_TABLET | Freq: Two times a day (BID) | ORAL | Status: DC

## 2013-07-28 NOTE — Telephone Encounter (Signed)
Patient is requesting a refill on Ritalin.

## 2013-07-28 NOTE — Telephone Encounter (Signed)
Printed rx for [name] to sign.

## 2013-07-31 NOTE — Telephone Encounter (Signed)
Left message advising patient ritalin script is ready for pick up.

## 2013-08-19 ENCOUNTER — Other Ambulatory Visit: Payer: Self-pay | Admitting: Physical Medicine & Rehabilitation

## 2013-08-25 ENCOUNTER — Encounter: Payer: Self-pay | Admitting: Physical Medicine and Rehabilitation

## 2013-08-25 ENCOUNTER — Encounter: Payer: 59 | Attending: Physical Medicine and Rehabilitation | Admitting: Physical Medicine and Rehabilitation

## 2013-08-25 VITALS — BP 148/78 | HR 105 | Resp 14 | Ht 61.0 in | Wt 169.8 lb

## 2013-08-25 DIAGNOSIS — M47817 Spondylosis without myelopathy or radiculopathy, lumbosacral region: Secondary | ICD-10-CM

## 2013-08-25 DIAGNOSIS — F411 Generalized anxiety disorder: Secondary | ICD-10-CM

## 2013-08-25 DIAGNOSIS — M47816 Spondylosis without myelopathy or radiculopathy, lumbar region: Secondary | ICD-10-CM

## 2013-08-25 DIAGNOSIS — Z981 Arthrodesis status: Secondary | ICD-10-CM | POA: Insufficient documentation

## 2013-08-25 DIAGNOSIS — F3289 Other specified depressive episodes: Secondary | ICD-10-CM

## 2013-08-25 DIAGNOSIS — F329 Major depressive disorder, single episode, unspecified: Secondary | ICD-10-CM

## 2013-08-25 DIAGNOSIS — M545 Low back pain, unspecified: Secondary | ICD-10-CM | POA: Insufficient documentation

## 2013-08-25 DIAGNOSIS — M542 Cervicalgia: Secondary | ICD-10-CM | POA: Insufficient documentation

## 2013-08-25 DIAGNOSIS — X58XXXA Exposure to other specified factors, initial encounter: Secondary | ICD-10-CM | POA: Insufficient documentation

## 2013-08-25 DIAGNOSIS — M961 Postlaminectomy syndrome, not elsewhere classified: Secondary | ICD-10-CM

## 2013-08-25 DIAGNOSIS — M1990 Unspecified osteoarthritis, unspecified site: Secondary | ICD-10-CM

## 2013-08-25 DIAGNOSIS — Z79899 Other long term (current) drug therapy: Secondary | ICD-10-CM | POA: Insufficient documentation

## 2013-08-25 DIAGNOSIS — S8990XA Unspecified injury of unspecified lower leg, initial encounter: Secondary | ICD-10-CM | POA: Insufficient documentation

## 2013-08-25 DIAGNOSIS — F0781 Postconcussional syndrome: Secondary | ICD-10-CM

## 2013-08-25 DIAGNOSIS — F341 Dysthymic disorder: Secondary | ICD-10-CM | POA: Insufficient documentation

## 2013-08-25 MED ORDER — METHYLPHENIDATE HCL 5 MG PO TABS: 5.0000 mg | ORAL_TABLET | Freq: Two times a day (BID) | ORAL | Status: DC

## 2013-08-25 MED ORDER — ACETAMINOPHEN-CODEINE #4 300-60 MG PO TABS: 1.0000 | ORAL_TABLET | Freq: Four times a day (QID) | ORAL | Status: DC | PRN

## 2013-08-25 NOTE — Progress Notes (Signed)
Subjective:    Patient ID: [name], female    DOB: [birth year], 57 y.o.   MRN: [id number]  HPI The patient is a 57 year old  female, who presents with LBP and left sided neck pain .  The patient complains about moderate to severe pain , the LBP radiates into her LLE intermittendly.  She describes the pain as dull and aching . Applying heat, taking medications , changing positions alleviate the symptoms. Prolonged sitting aggrevates the symptoms. The patient grades his pain as a  7/10. Hx of ACDF C5-6, 06/28/13, by Dr. Yetta Barre Hx of PSF L4-S1, 11/03/12, by Dr. Yetta Barre The problem is improving some.  Pain Inventory Average Pain 6 Pain Right Now 7 My pain is sharp, burning, dull, tingling and aching  In the last 24 hours, has pain interfered with the following? General activity 10 Relation with others 10 Enjoyment of life 10 What TIME of day is your pain at its worst? all Sleep (in general) Good  Pain is worse with: walking, bending, sitting, standing, unsure and some activites Pain improves with: rest, heat/ice and medication Relief from Meds: 5  Mobility use a cane use a walker how many minutes can you walk? 30 ability to climb steps?  yes do you drive?  yes  Function not employed: date last employed 2010 disabled: date disabled 2014 I need assistance with the following:  meal prep, household duties and shopping  Neuro/Psych weakness numbness tremor tingling trouble walking spasms dizziness confusion depression anxiety loss of taste or smell  Prior Studies Any changes since last visit?  no  Physicians involved in your care Any changes since last visit?  no   Family History  Problem Relation Age of Onset  . Cancer Mother     cervical  . Cancer Father     colon   History   Social History  . Marital Status: Married    Spouse Name: N/A    Number of Children: N/A  . Years of Education: N/A   Social History Main Topics  . Smoking status: Never Smoker    . Smokeless tobacco: Never Used  . Alcohol Use: No  . Drug Use: No  . Sexual Activity: Yes   Other Topics Concern  . None   Social History Narrative  . None   Past Surgical History  Procedure Laterality Date  . Abdominal hysterectomy  1999  . Tonsillectomy  1981  . Tubal ligation  1997  . Bunions removed  1988  . Edg  12/17/2004  . Electrocardiogram  05/27/2007  . Skin cancer excision     Past Medical History  Diagnosis Date  . ANXIETY 06/10/2007  . ASYMPTOMATIC POSTMENOPAUSAL STATUS 07/19/2009  . Cough 04/02/2009  . DYSLIPIDEMIA 06/28/2008  . GERD 06/10/2007  . 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 HYPERGLYCEMIA 06/28/2008  . HYPERTENSION 06/10/2007  . Irritable bowel syndrome 06/28/2008  . OSTEOARTHRITIS 06/10/2007  . UPPER RESPIRATORY INFECTION 03/26/2009  . WEIGHT GAIN 01/21/2010   BP 148/78  Pulse 105  Resp 14  Ht 5\' 1"  (1.549 m)  Wt 169 lb 12.8 oz (77.021 kg)  BMI 32.10 kg/m2  SpO2 98%   Review of Systems  Constitutional: Positive for appetite change.  Musculoskeletal: Positive for back pain and gait problem.       Spasm  Neurological: Positive for dizziness, tremors, weakness and numbness.       Tingling  Psychiatric/Behavioral: Positive for confusion and dysphoric mood. The patient is nervous/anxious.   All other systems reviewed  and are negative.       Objective:   Physical Exam  Constitutional: She is oriented to person, place, and time. She appears well-developed and well-nourished.  HENT:  Head: Normocephalic.  Neck: Neck supple.  Musculoskeletal: She exhibits tenderness.  Neurological: She is alert and oriented to person, place, and time.  Skin: Skin is warm and dry.  Psychiatric: She has a normal mood and affect.  Symmetric normal motor tone is noted throughout. Normal muscle bulk. Muscle testing reveals 5/5 muscle strength of the upper extremity, and 5/5 of the lower extremity. Full range of motion in upper and lower extremities. ROM of C-spine is mildly restricted,  45 degree rotation bilateral, with pain at the end of ROM with rotation to the left. Fine motor movements are normal in both hands. Sensory is intact and symmetric to light touch, pinprick and proprioception.  Patient arises from chair with mild difficulty.        Assessment & Plan:  1.s/p L4-5 L5-S1 decompression stabilization, on 11/03/12, by Dr. Yetta Barre, with substantial improvement of her back and  leg pain. 2. Cervical ACDF  C5-6, on 06/28/13, by Dr. Yetta Barre recovering very well 3. Left knee meniscus injury.  4. Depression with anxiety which has responded to the elavil and ritalin.  5.  Concussion on 8/2 with post concussion syndrome- she has ongoing memory and concentration deficits.  PLAN:  1. She would like to switch from her Hydrocodone 10mg  qid to Tylenol, I gave her a prescription for Tylenol # 4 qid, she will see Dr. [name] in 1 month, and report how this went for her, she wants to safe money, and does not want to have to come to the office every month.   4. Elavil 10-20mg  qhs for sleep, mood, pain. Melatonin to assist with sleep architecture  5. Ritalin will continue for arousal and attention, gave new Rx to be filled when due.  6 . back in about 1 month, has app with Dr. [name].  All questions were encouraged and answered. Recommended starting a more regular walking program, and showed her some tricks to improve her posture.

## 2013-08-25 NOTE — Patient Instructions (Signed)
Continue with your walking as tolerated

## 2013-08-28 ENCOUNTER — Other Ambulatory Visit: Payer: Self-pay | Admitting: *Deleted

## 2013-08-28 MED ORDER — OMEPRAZOLE 40 MG PO CPDR: 40.0000 mg | DELAYED_RELEASE_CAPSULE | Freq: Every day | ORAL | Status: DC

## 2013-08-28 MED ORDER — ATORVASTATIN CALCIUM 40 MG PO TABS: 40.0000 mg | ORAL_TABLET | Freq: Every day | ORAL | Status: DC

## 2013-09-11 ENCOUNTER — Ambulatory Visit
Admission: RE | Admit: 2013-09-11 | Discharge: 2013-09-11 | Disposition: A | Discharge status: Home / Self Care | Payer: 59 | Source: Ambulatory Visit

## 2013-09-11 DIAGNOSIS — Z1231 Encounter for screening mammogram for malignant neoplasm of breast: Secondary | ICD-10-CM

## 2013-09-12 ENCOUNTER — Encounter: Payer: Self-pay | Admitting: Endocrinology

## 2013-09-13 ENCOUNTER — Other Ambulatory Visit: Payer: Self-pay | Admitting: *Deleted

## 2013-09-13 MED ORDER — VENLAFAXINE HCL ER 225 MG PO TB24: 225.0000 mg | ORAL_TABLET | Freq: Every day | ORAL | Status: DC

## 2013-09-13 MED ORDER — ACYCLOVIR 800 MG PO TABS: 800.0000 mg | ORAL_TABLET | Freq: Every day | ORAL | Status: DC

## 2013-09-19 ENCOUNTER — Other Ambulatory Visit: Payer: Self-pay | Admitting: *Deleted

## 2013-09-19 DIAGNOSIS — M47817 Spondylosis without myelopathy or radiculopathy, lumbosacral region: Secondary | ICD-10-CM

## 2013-09-19 DIAGNOSIS — F329 Major depressive disorder, single episode, unspecified: Secondary | ICD-10-CM

## 2013-09-19 DIAGNOSIS — M47816 Spondylosis without myelopathy or radiculopathy, lumbar region: Secondary | ICD-10-CM

## 2013-09-19 DIAGNOSIS — F3289 Other specified depressive episodes: Secondary | ICD-10-CM

## 2013-09-19 DIAGNOSIS — M1990 Unspecified osteoarthritis, unspecified site: Secondary | ICD-10-CM

## 2013-09-19 DIAGNOSIS — F0781 Postconcussional syndrome: Secondary | ICD-10-CM

## 2013-09-19 DIAGNOSIS — F411 Generalized anxiety disorder: Secondary | ICD-10-CM

## 2013-09-20 ENCOUNTER — Telehealth: Payer: Self-pay | Admitting: *Deleted

## 2013-09-20 DIAGNOSIS — F3289 Other specified depressive episodes: Secondary | ICD-10-CM

## 2013-09-20 DIAGNOSIS — M47816 Spondylosis without myelopathy or radiculopathy, lumbar region: Secondary | ICD-10-CM

## 2013-09-20 DIAGNOSIS — M1990 Unspecified osteoarthritis, unspecified site: Secondary | ICD-10-CM

## 2013-09-20 DIAGNOSIS — M47817 Spondylosis without myelopathy or radiculopathy, lumbosacral region: Secondary | ICD-10-CM

## 2013-09-20 DIAGNOSIS — F329 Major depressive disorder, single episode, unspecified: Secondary | ICD-10-CM

## 2013-09-20 DIAGNOSIS — F0781 Postconcussional syndrome: Secondary | ICD-10-CM

## 2013-09-20 DIAGNOSIS — F411 Generalized anxiety disorder: Secondary | ICD-10-CM

## 2013-09-20 MED ORDER — METHYLPHENIDATE HCL 5 MG PO TABS: 5.0000 mg | ORAL_TABLET | Freq: Two times a day (BID) | ORAL | Status: DC

## 2013-09-20 NOTE — Telephone Encounter (Signed)
Awaiting [name]'s reply about how her tylenol # 4 is working for her.

## 2013-09-21 NOTE — Telephone Encounter (Signed)
Left message for [name] to call about medications.  A myChart message was sent to her also requesting clarification of her medications

## 2013-09-22 ENCOUNTER — Encounter: Payer: 59 | Admitting: Physical Medicine & Rehabilitation

## 2013-09-25 ENCOUNTER — Encounter: Payer: Self-pay | Admitting: *Deleted

## 2013-09-25 ENCOUNTER — Telehealth: Payer: Self-pay | Admitting: *Deleted

## 2013-09-25 ENCOUNTER — Encounter: Payer: 59 | Attending: Physical Medicine & Rehabilitation | Admitting: *Deleted

## 2013-09-25 VITALS — BP 142/65 | HR 106 | Resp 14 | Ht 61.0 in | Wt 169.0 lb

## 2013-09-25 DIAGNOSIS — F0781 Postconcussional syndrome: Secondary | ICD-10-CM | POA: Insufficient documentation

## 2013-09-25 DIAGNOSIS — F329 Major depressive disorder, single episode, unspecified: Secondary | ICD-10-CM

## 2013-09-25 DIAGNOSIS — M1990 Unspecified osteoarthritis, unspecified site: Secondary | ICD-10-CM

## 2013-09-25 DIAGNOSIS — M47812 Spondylosis without myelopathy or radiculopathy, cervical region: Secondary | ICD-10-CM

## 2013-09-25 DIAGNOSIS — M545 Low back pain, unspecified: Secondary | ICD-10-CM | POA: Insufficient documentation

## 2013-09-25 DIAGNOSIS — Z981 Arthrodesis status: Secondary | ICD-10-CM | POA: Insufficient documentation

## 2013-09-25 DIAGNOSIS — M47816 Spondylosis without myelopathy or radiculopathy, lumbar region: Secondary | ICD-10-CM

## 2013-09-25 DIAGNOSIS — M542 Cervicalgia: Secondary | ICD-10-CM | POA: Insufficient documentation

## 2013-09-25 DIAGNOSIS — F411 Generalized anxiety disorder: Secondary | ICD-10-CM

## 2013-09-25 DIAGNOSIS — M47817 Spondylosis without myelopathy or radiculopathy, lumbosacral region: Secondary | ICD-10-CM

## 2013-09-25 DIAGNOSIS — F3289 Other specified depressive episodes: Secondary | ICD-10-CM

## 2013-09-25 DIAGNOSIS — Z79899 Other long term (current) drug therapy: Secondary | ICD-10-CM

## 2013-09-25 DIAGNOSIS — Z5181 Encounter for therapeutic drug level monitoring: Secondary | ICD-10-CM

## 2013-09-25 MED ORDER — AMITRIPTYLINE HCL 10 MG PO TABS: 10.0000 mg | ORAL_TABLET | Freq: Every day | ORAL | Status: DC

## 2013-09-25 MED ORDER — ALPRAZOLAM 0.5 MG PO TABS: 0.5000 mg | ORAL_TABLET | Freq: Three times a day (TID) | ORAL | Status: DC | PRN
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
## 2013-09-25 NOTE — Patient Instructions (Signed)
Please schedule for 2 month follow up with RN for med management

## 2013-09-26 ENCOUNTER — Other Ambulatory Visit: Payer: Self-pay | Admitting: *Deleted

## 2013-09-26 ENCOUNTER — Encounter: Payer: Self-pay | Admitting: Endocrinology

## 2013-09-26 MED ORDER — HYDROCODONE-ACETAMINOPHEN 10-325 MG PO TABS: 1.0000 | ORAL_TABLET | Freq: Four times a day (QID) | ORAL | Status: DC | PRN

## 2013-09-26 MED ORDER — OMEPRAZOLE 40 MG PO CPDR: 40.0000 mg | DELAYED_RELEASE_CAPSULE | Freq: Every day | ORAL | Status: DC

## 2013-09-26 NOTE — Telephone Encounter (Signed)
Ok to change to hydrocodone. You are correct about ritalin. She needs this to help with poor attention and memory

## 2013-09-26 NOTE — Telephone Encounter (Signed)
Please refill prn 

## 2013-10-11 LAB — HM COLONOSCOPY

## 2013-10-16 ENCOUNTER — Other Ambulatory Visit: Payer: Self-pay

## 2013-10-16 MED ORDER — VENLAFAXINE HCL ER 225 MG PO TB24: 225.0000 mg | ORAL_TABLET | Freq: Every day | ORAL | Status: DC

## 2013-10-17 ENCOUNTER — Other Ambulatory Visit: Payer: Self-pay | Admitting: *Deleted

## 2013-10-17 ENCOUNTER — Other Ambulatory Visit: Payer: Self-pay | Admitting: Endocrinology

## 2013-10-17 ENCOUNTER — Encounter: Payer: Self-pay | Admitting: Physical Medicine & Rehabilitation

## 2013-10-17 MED ORDER — LISINOPRIL-HYDROCHLOROTHIAZIDE 10-12.5 MG PO TABS: 0.5000 | ORAL_TABLET | Freq: Every day | ORAL | Status: DC

## 2013-10-17 MED ORDER — HYDROCODONE-ACETAMINOPHEN 10-325 MG PO TABS: 1.0000 | ORAL_TABLET | Freq: Four times a day (QID) | ORAL | Status: DC | PRN

## 2013-10-17 NOTE — Telephone Encounter (Signed)
Printed RX for Dr Naaman Plummer to sign for Nyeisha's hydrocodone. My chart messge sent to let her know she can pick it up tomorrow  10/18/13

## 2013-10-23 ENCOUNTER — Other Ambulatory Visit: Payer: Self-pay | Admitting: Physical Medicine & Rehabilitation

## 2013-10-31 ENCOUNTER — Encounter: Payer: Self-pay | Admitting: Endocrinology

## 2013-11-16 ENCOUNTER — Other Ambulatory Visit: Payer: Self-pay | Admitting: *Deleted

## 2013-11-16 MED ORDER — VENLAFAXINE HCL ER 225 MG PO TB24: 1.0000 | ORAL_TABLET | Freq: Every day | ORAL | Status: DC

## 2013-11-16 MED ORDER — OMEPRAZOLE 40 MG PO CPDR: 40.0000 mg | DELAYED_RELEASE_CAPSULE | Freq: Every day | ORAL | Status: DC

## 2013-11-22 ENCOUNTER — Encounter: Payer: 59 | Attending: Physical Medicine and Rehabilitation | Admitting: Physical Medicine & Rehabilitation

## 2013-11-22 ENCOUNTER — Encounter: Payer: Self-pay | Admitting: Physical Medicine & Rehabilitation

## 2013-11-22 VITALS — BP 141/73 | HR 103 | Resp 14 | Ht 61.0 in | Wt 171.0 lb

## 2013-11-22 DIAGNOSIS — F329 Major depressive disorder, single episode, unspecified: Secondary | ICD-10-CM

## 2013-11-22 DIAGNOSIS — M461 Sacroiliitis, not elsewhere classified: Secondary | ICD-10-CM | POA: Insufficient documentation

## 2013-11-22 DIAGNOSIS — B9789 Other viral agents as the cause of diseases classified elsewhere: Secondary | ICD-10-CM | POA: Insufficient documentation

## 2013-11-22 DIAGNOSIS — M1990 Unspecified osteoarthritis, unspecified site: Secondary | ICD-10-CM | POA: Insufficient documentation

## 2013-11-22 DIAGNOSIS — M47817 Spondylosis without myelopathy or radiculopathy, lumbosacral region: Secondary | ICD-10-CM

## 2013-11-22 DIAGNOSIS — B349 Viral infection, unspecified: Secondary | ICD-10-CM

## 2013-11-22 DIAGNOSIS — M47816 Spondylosis without myelopathy or radiculopathy, lumbar region: Secondary | ICD-10-CM

## 2013-11-22 DIAGNOSIS — F0781 Postconcussional syndrome: Secondary | ICD-10-CM

## 2013-11-22 DIAGNOSIS — F3289 Other specified depressive episodes: Secondary | ICD-10-CM | POA: Insufficient documentation

## 2013-11-22 DIAGNOSIS — X58XXXA Exposure to other specified factors, initial encounter: Secondary | ICD-10-CM | POA: Insufficient documentation

## 2013-11-22 DIAGNOSIS — F411 Generalized anxiety disorder: Secondary | ICD-10-CM | POA: Insufficient documentation

## 2013-11-22 MED ORDER — HYDROCODONE-ACETAMINOPHEN 10-325 MG PO TABS: 1.0000 | ORAL_TABLET | Freq: Four times a day (QID) | ORAL | Status: DC | PRN

## 2013-11-22 MED ORDER — METHYLPHENIDATE HCL 10 MG PO TABS: 10.0000 mg | ORAL_TABLET | Freq: Two times a day (BID) | ORAL | Status: DC

## 2013-11-22 MED ORDER — METHOCARBAMOL 500 MG PO TABS: 500.0000 mg | ORAL_TABLET | Freq: Four times a day (QID) | ORAL | Status: DC | PRN

## 2013-11-22 MED ORDER — DULOXETINE HCL 30 MG PO CPEP: 30.0000 mg | ORAL_CAPSULE | Freq: Every day | ORAL | Status: DC

## 2013-11-22 NOTE — Progress Notes (Signed)
Subjective:    Patient ID: [name], female    DOB: 08-22-1956, 58 y.o.   MRN: [id number]  HPI  Lajuanda is back regarding her multiple complaints. She is complaining of increased low back pain. She describes it as sharp and shooting across her back. It seems to bother her with simple movements, twisting. When she sits for prolonged periods of time at her desk or on the floor her right leg will go numb with some associated pain. She tends to keep her feet elevated up on a stool.  She had cervical surgery in September which seems to have gone quite well.   Her knees still bother her with weight bearing. She uses a cane for balance and off loading.   She is taking 4 hydrocodone per day. She is no longer using the flexeril due to sedation.   Her mood has been worsening over the last few months. It seemed to bottom out in January. She has concerns over the relationship with her husband and is discouraged by her pain and neurological issues. She felt suicidal at one point but never thought of following through.    Pain Inventory Average Pain 8 Pain Right Now 7 My pain is sharp, burning, dull, stabbing, tingling and aching  In the last 24 hours, has pain interfered with the following? General activity 10 Relation with others 10 Enjoyment of life 10 What TIME of day is your pain at its worst? constant Sleep (in general) Fair  Pain is worse with: walking, bending, sitting, inactivity, standing and some activites Pain improves with: rest, pacing activities and medication Relief from Meds: 7  Mobility walk with assistance use a cane use a walker how many minutes can you walk? 15-30 ability to climb steps?  yes do you drive?  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  no   Family History  Problem Relation Age of Onset  . Cancer Mother     cervical  . Cancer Father     colon   History   Social History  . Marital Status: Married    Spouse Name: N/A    Number of Children: N/A  . Years of Education: N/A   Social History Main Topics  . Smoking status: Never Smoker   . Smokeless tobacco: Never Used  . Alcohol Use: No  . Drug Use: No  . Sexual Activity: Yes   Other Topics Concern  . None   Social History Narrative  . None   Past Surgical History  Procedure Laterality Date  . Abdominal hysterectomy  1999  . Tonsillectomy  1981  . Tubal ligation  1997  . Bunions removed  1988  . Edg  12/17/2004  . Electrocardiogram  05/27/2007  . Skin cancer excision     Past Medical History  Diagnosis Date  . ANXIETY 06/10/2007  . ASYMPTOMATIC POSTMENOPAUSAL STATUS 07/19/2009  . Cough 04/02/2009  . DYSLIPIDEMIA 06/28/2008  . GERD 06/10/2007  . 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 HYPERGLYCEMIA 06/28/2008  . HYPERTENSION 06/10/2007  . Irritable bowel syndrome 06/28/2008  . OSTEOARTHRITIS 06/10/2007  . UPPER RESPIRATORY INFECTION 03/26/2009  . WEIGHT GAIN 01/21/2010   BP 141/73  Pulse 103  Resp 14  Ht 5\' 1"  (1.549 m)  Wt 171 lb (77.565 kg)  BMI 32.33 kg/m2  SpO2 99%  Opioid Risk Score: 1 Fall Risk Score: High Fall Risk (>13 points) (patient educated handout declined)   Review of Systems  Constitutional: Positive for appetite change and unexpected weight change.  Gastrointestinal: Positive for diarrhea.  Musculoskeletal: Positive for arthralgias, back pain, gait problem and myalgias.  Neurological: Positive for dizziness, tremors, weakness and numbness.  Psychiatric/Behavioral: Positive for suicidal ideas, confusion and dysphoric mood. The patient is nervous/anxious.   All other systems reviewed and are negative.         Objective:   Physical Exam  Constitutional: She is oriented to person, place, and time. She appears well-developed and well-nourished. . Has gained weight.  HENT:  Head: Normocephalic and atraumatic.  Eyes: Conjunctivae and EOM are normal. Pupils are equal, round, and reactive to light.  Neck: Normal range of motion.  Cardiovascular: Normal rate.  Pulmonary/Chest: Effort normal and breath sounds normal.  Abdominal: Soft. Bowel sounds are normal.  Musculoskeletal: Normal range of motion.  Neck slightly limited. Back tender to palpation and flexion. SIJ, PSIS's are painful. FABER and compression tests are negative. Right lower lumbar paraspinals are taut. Lumbar flexion is poor. Hamstrings are tight. Neurological: memory and attention are better. Strength symmetrical. Sensory is normal. DTR's 1+.  Skin: Skin is warm.  Psychiatric: Her mood is tearful at times. She seems somewhat depressed.    Assessment & Plan:   ASSESSMENT:  1. 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 Recent MRI notable for protruded disc at L4-5 and an extruded disc at L5-S1. She is now s/p L4-5 L5-S1 decompression stabilization with substantial mprovement of her back and right leg pain.  2. Left knee meniscus injury.  3. Depression with anxiety.  Worsening over the last few months. 4. Cervical spondylosis/Myofascial pain.- see below  5. Concussion on 8/2 with post concussion syndrome- she has ongoing memory and concentration deficits. 6. 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 Adjusting seating at home to avoid sitting on IT's. Her sitting position may be more of the problem than anything else. (she is short!) 2. PT for SIJ management and to improve trunk and pelvis/le rom. Consider SIJ blocks 3. Hydrocodone will continue for breakthrough pain.  4. Elavil 10-20mg  qhs for sleep, mood, pain. Can continue Melatonin to assist with  sleep architecture  4. Robaxin for muscle relaxation. Dc flexeril due to sedation 5. Increase ritalin for energy, memory, focus. 8-10 hours of sleep. Add cymbalta 30mg --wean off effexor 6 .I'll see her back in about 2 months. 30 minutes of face to face patient care time were spent during this visit. All questions were encouraged and answered.

## 2013-11-22 NOTE — Patient Instructions (Signed)
Do not start cymbalta until you have increased ritalin for one week

## 2013-11-24 ENCOUNTER — Encounter: Payer: Self-pay | Admitting: Endocrinology

## 2013-11-30 ENCOUNTER — Telehealth: Payer: Self-pay

## 2013-11-30 ENCOUNTER — Encounter: Payer: Self-pay | Admitting: Physical Medicine & Rehabilitation

## 2013-11-30 NOTE — Telephone Encounter (Signed)
Patient called and stated that [facility] had called her and wanted our clinic to call them. I contacted [facility] and they had questions regarding patients RX for Cymbalta and Effexor. Informed Pharmacist that patient is being weaned off the Effexor, so they will fill patient's Cymbalta. Left patient a voicemail informing her that everything has been taken care of with the pharmacy and if she has any questions give [place] a call back.

## 2013-12-13 ENCOUNTER — Encounter: Payer: Self-pay | Admitting: Physical Medicine & Rehabilitation

## 2013-12-15 ENCOUNTER — Other Ambulatory Visit: Payer: Self-pay | Admitting: *Deleted

## 2013-12-15 DIAGNOSIS — M461 Sacroiliitis, not elsewhere classified: Secondary | ICD-10-CM

## 2013-12-15 DIAGNOSIS — M1990 Unspecified osteoarthritis, unspecified site: Secondary | ICD-10-CM

## 2013-12-15 DIAGNOSIS — F329 Major depressive disorder, single episode, unspecified: Secondary | ICD-10-CM

## 2013-12-15 DIAGNOSIS — B349 Viral infection, unspecified: Secondary | ICD-10-CM

## 2013-12-15 DIAGNOSIS — F3289 Other specified depressive episodes: Secondary | ICD-10-CM

## 2013-12-15 DIAGNOSIS — F411 Generalized anxiety disorder: Secondary | ICD-10-CM

## 2013-12-15 MED ORDER — ALPRAZOLAM 0.5 MG PO TABS: 0.5000 mg | ORAL_TABLET | Freq: Three times a day (TID) | ORAL | Status: DC | PRN

## 2013-12-15 MED ORDER — HYDROCODONE-ACETAMINOPHEN 10-325 MG PO TABS: 1.0000 | ORAL_TABLET | Freq: Four times a day (QID) | ORAL | Status: DC | PRN

## 2013-12-15 NOTE — Telephone Encounter (Signed)
RX printed for MD to sign for RN visit 12/18/13 

## 2013-12-18 ENCOUNTER — Encounter: Payer: 59 | Attending: Physical Medicine & Rehabilitation | Admitting: *Deleted

## 2013-12-18 ENCOUNTER — Encounter: Payer: Self-pay | Admitting: *Deleted

## 2013-12-18 VITALS — BP 129/66 | HR 119 | Resp 14

## 2013-12-18 DIAGNOSIS — I10 Essential (primary) hypertension: Secondary | ICD-10-CM | POA: Insufficient documentation

## 2013-12-18 DIAGNOSIS — M47817 Spondylosis without myelopathy or radiculopathy, lumbosacral region: Secondary | ICD-10-CM | POA: Insufficient documentation

## 2013-12-18 DIAGNOSIS — K219 Gastro-esophageal reflux disease without esophagitis: Secondary | ICD-10-CM | POA: Insufficient documentation

## 2013-12-18 DIAGNOSIS — F3289 Other specified depressive episodes: Secondary | ICD-10-CM

## 2013-12-18 DIAGNOSIS — E785 Hyperlipidemia, unspecified: Secondary | ICD-10-CM | POA: Insufficient documentation

## 2013-12-18 DIAGNOSIS — F329 Major depressive disorder, single episode, unspecified: Secondary | ICD-10-CM

## 2013-12-18 DIAGNOSIS — F0781 Postconcussional syndrome: Secondary | ICD-10-CM

## 2013-12-18 DIAGNOSIS — M47812 Spondylosis without myelopathy or radiculopathy, cervical region: Secondary | ICD-10-CM | POA: Insufficient documentation

## 2013-12-18 MED ORDER — METHYLPHENIDATE HCL 10 MG PO TABS: 10.0000 mg | ORAL_TABLET | Freq: Two times a day (BID) | ORAL | Status: DC

## 2013-12-18 NOTE — Patient Instructions (Signed)
Follow up with DR Naaman Plummer 01/19/14
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
## 2013-12-19 ENCOUNTER — Telehealth: Payer: Self-pay | Admitting: *Deleted

## 2013-12-19 NOTE — Telephone Encounter (Signed)
Notified Ms Boise that rx is available for pick up at the front desk.

## 2013-12-20 ENCOUNTER — Telehealth: Payer: Self-pay

## 2013-12-20 DIAGNOSIS — F32A Depression, unspecified: Secondary | ICD-10-CM

## 2013-12-20 DIAGNOSIS — F329 Major depressive disorder, single episode, unspecified: Secondary | ICD-10-CM

## 2013-12-20 NOTE — Telephone Encounter (Signed)
Patient came into the office today.  She was tearful and upset.  She said she tried to contact her sister and her sister wouldn't speak to her.  She woke up this morning angry wanting to kill her sister.  Patient has recently started cymbalta and stopped effexor.  Per Dr Naaman Plummer patient should go back on effexor.  He also advised she take her xanax, she says she is taking this tid.  We will refer the patient to Dr Valentina Shaggy.  Reccommended patient reach out to the health department, [facility] and her local nursing home for assistance as well.   Also advised patient she could go to the hospital and be evaluated.  She is going to go home and put on some soothing music.  Patient smiled as she left.

## 2013-12-21 ENCOUNTER — Ambulatory Visit: Payer: 59 | Admitting: Physical Medicine and Rehabilitation

## 2013-12-28 ENCOUNTER — Encounter: Payer: Self-pay | Admitting: Physical Medicine & Rehabilitation

## 2014-01-02 ENCOUNTER — Encounter: Payer: Self-pay | Admitting: Physical Medicine & Rehabilitation

## 2014-01-02 ENCOUNTER — Encounter: Payer: Self-pay | Admitting: Endocrinology

## 2014-01-03 ENCOUNTER — Other Ambulatory Visit: Payer: Self-pay

## 2014-01-03 MED ORDER — VENLAFAXINE HCL ER 75 MG PO CP24: 225.0000 mg | ORAL_CAPSULE | Freq: Every day | ORAL | Status: DC

## 2014-01-03 NOTE — Telephone Encounter (Signed)
[facility]ffexor [facility]ill [facility].  Patient aware.

## 2014-01-04 DIAGNOSIS — F4542 Pain disorder with related psychological factors: Secondary | ICD-10-CM

## 2014-01-04 DIAGNOSIS — F331 Major depressive disorder, recurrent, moderate: Secondary | ICD-10-CM

## 2014-01-11 DIAGNOSIS — F331 Major depressive disorder, recurrent, moderate: Secondary | ICD-10-CM

## 2014-01-14 ENCOUNTER — Encounter: Payer: Self-pay | Admitting: Endocrinology

## 2014-01-15 ENCOUNTER — Other Ambulatory Visit: Payer: Self-pay | Admitting: Physical Medicine & Rehabilitation

## 2014-01-15 ENCOUNTER — Other Ambulatory Visit: Payer: Self-pay

## 2014-01-15 MED ORDER — ATORVASTATIN CALCIUM 40 MG PO TABS: 40.0000 mg | ORAL_TABLET | Freq: Every day | ORAL | Status: DC

## 2014-01-15 MED ORDER — ACYCLOVIR 800 MG PO TABS: 800.0000 mg | ORAL_TABLET | Freq: Every day | ORAL | Status: DC

## 2014-01-18 DIAGNOSIS — F331 Major depressive disorder, recurrent, moderate: Secondary | ICD-10-CM

## 2014-01-19 ENCOUNTER — Encounter: Payer: Self-pay | Admitting: Physical Medicine & Rehabilitation

## 2014-01-19 ENCOUNTER — Encounter: Payer: 59 | Attending: Physical Medicine and Rehabilitation | Admitting: Physical Medicine & Rehabilitation

## 2014-01-19 VITALS — BP 156/75 | HR 94 | Resp 14 | Ht 61.0 in | Wt 172.0 lb

## 2014-01-19 DIAGNOSIS — M1990 Unspecified osteoarthritis, unspecified site: Secondary | ICD-10-CM | POA: Insufficient documentation

## 2014-01-19 DIAGNOSIS — F0781 Postconcussional syndrome: Secondary | ICD-10-CM | POA: Insufficient documentation

## 2014-01-19 DIAGNOSIS — M461 Sacroiliitis, not elsewhere classified: Secondary | ICD-10-CM | POA: Insufficient documentation

## 2014-01-19 DIAGNOSIS — X58XXXA Exposure to other specified factors, initial encounter: Secondary | ICD-10-CM | POA: Insufficient documentation

## 2014-01-19 DIAGNOSIS — F3289 Other specified depressive episodes: Secondary | ICD-10-CM

## 2014-01-19 DIAGNOSIS — B9789 Other viral agents as the cause of diseases classified elsewhere: Secondary | ICD-10-CM | POA: Insufficient documentation

## 2014-01-19 DIAGNOSIS — M47817 Spondylosis without myelopathy or radiculopathy, lumbosacral region: Secondary | ICD-10-CM

## 2014-01-19 DIAGNOSIS — F329 Major depressive disorder, single episode, unspecified: Secondary | ICD-10-CM | POA: Insufficient documentation

## 2014-01-19 DIAGNOSIS — F411 Generalized anxiety disorder: Secondary | ICD-10-CM | POA: Insufficient documentation

## 2014-01-19 DIAGNOSIS — M47816 Spondylosis without myelopathy or radiculopathy, lumbar region: Secondary | ICD-10-CM

## 2014-01-19 DIAGNOSIS — B349 Viral infection, unspecified: Secondary | ICD-10-CM

## 2014-01-19 MED ORDER — HYDROCODONE-ACETAMINOPHEN 10-325 MG PO TABS: 1.0000 | ORAL_TABLET | Freq: Four times a day (QID) | ORAL | Status: DC | PRN

## 2014-01-19 MED ORDER — METHYLPHENIDATE HCL 10 MG PO TABS: 10.0000 mg | ORAL_TABLET | Freq: Two times a day (BID) | ORAL | Status: DC

## 2014-01-19 NOTE — Patient Instructions (Addendum)
Let me know about your psychiatrist options. I will make a referral.  Also check with [facility] about psychiatry options within their program.  Increase the effexor to 2 capsules daily for 3 weeks, then to 3 capsules if you are not feeling any better

## 2014-01-19 NOTE — Progress Notes (Signed)
Subjective:    Patient ID: [name], female    DOB: August 30, 1956, 58 y.o.   MRN: [id number]  HPI  [name] is back regarding her chronic pain. She has seen Dr. Valentina Shaggy for 3 visits. I have been in contact with Dr. Valentina Shaggy as well. He feels that she needs psychiatric assessment and treatment. Journii has been very depressed at times. She feels that her husband is not supporting her in any way. Her husband perceives her needs as a financial burden in many ways. She has felt suicidal at times but has never had any intentions on following through. She does have a few friends who she talks to who really help. Dr. Valentina Shaggy has been very helpful as well.   We discussed her effexor today and she is only taking 75mg  currently---she has never titrated up higher. She remains on the ritalin for arousal and attention as i've rx'ed.   Her pain is diffuse involving her low back, knees, upper extremities etc. She is unable to pursue a lot of the physcial activities she wants to because of the pain.    Pain Inventory Average Pain 8 Pain Right Now 8 My pain is sharp, burning, dull, stabbing, tingling and aching  In the last 24 hours, has pain interfered with the following? General activity 10 Relation with others 10 Enjoyment of life 10 What TIME of day is your pain at its worst? all Sleep (in general) Fair  Pain is worse with: walking, bending, sitting, inactivity and standing Pain improves with: rest, heat/ice, pacing activities and medication Relief from Meds: 8  Mobility use a cane how many minutes can you walk? 15-30 ability to climb steps?  yes do you drive?  yes  Function not employed: date last employed . disabled: date disabled waiting on this I need assistance with the following:  dressing, meal prep, household duties and shopping  Neuro/Psych weakness numbness tremor tingling trouble walking spasms dizziness confusion depression anxiety suicidal thoughts  Working with Dr  Valentina Shaggy  Prior Studies Any changes since last visit?  no  Physicians involved in your care Any changes since last visit?  no   Family History  Problem Relation Age of Onset  . Cancer Mother     cervical  . Cancer Father     colon   History   Social History  . Marital Status: Married    Spouse Name: N/A    Number of Children: N/A  . Years of Education: N/A   Social History Main Topics  . Smoking status: Never Smoker   . Smokeless tobacco: Never Used  . Alcohol Use: No  . Drug Use: No  . Sexual Activity: Yes   Other Topics Concern  . None   Social History Narrative  . None   Past Surgical History  Procedure Laterality Date  . Abdominal hysterectomy  1999  . Tonsillectomy  1981  . Tubal ligation  1997  . Bunions removed  1988  . Edg  12/17/2004  . Electrocardiogram  05/27/2007  . Skin cancer excision     Past Medical History  Diagnosis Date  . ANXIETY 06/10/2007  . ASYMPTOMATIC POSTMENOPAUSAL STATUS 07/19/2009  . Cough 04/02/2009  . DYSLIPIDEMIA 06/28/2008  . GERD 06/10/2007  . 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 HYPERGLYCEMIA 06/28/2008  . HYPERTENSION 06/10/2007  . Irritable bowel syndrome 06/28/2008  . OSTEOARTHRITIS 06/10/2007  . UPPER RESPIRATORY INFECTION 03/26/2009  . WEIGHT GAIN 01/21/2010   BP 156/75  Pulse 94  Resp 14  Ht  5\' 1"  (1.549 m)  Wt 172 lb (78.019 kg)  BMI 32.52 kg/m2  SpO2 96%  Opioid Risk Score:   Fall Risk Score:   Review of Systems  Constitutional: Positive for diaphoresis and unexpected weight change.  Musculoskeletal:       Spasms  Neurological: Positive for dizziness, tremors, weakness and numbness.       Tingling  Psychiatric/Behavioral: Positive for suicidal ideas and dysphoric mood. The patient is nervous/anxious.        Working with Dr Valentina Shaggy  All other systems reviewed and are negative.      Objective:   Physical Exam  Constitutional: She is oriented to person, place, and time. She appears well-developed and well-nourished. . Has gained  weight.  HENT:  Head: Normocephalic and atraumatic.  Eyes: Conjunctivae and EOM are normal. Pupils are equal, round, and reactive to light.  Neck: Normal range of motion.  Cardiovascular: Normal rate.  Pulmonary/Chest: Effort normal and breath sounds normal.  Abdominal: Soft. Bowel sounds are normal.  Musculoskeletal: Normal range of motion.  Neck slightly limited. Back tender to palpation and flexion. SIJ, PSIS's are painful. FABER and compression tests are negative. Right lower lumbar paraspinals are taut. Lumbar flexion is poor. Hamstrings are tight.  Neurological: memory and attention are better. Strength symmetrical. Sensory is normal. DTR's 1+.  Skin: Skin is warm.  Psychiatric: flat, depressed. Not as tearful as our last visit.    Assessment & Plan:   ASSESSMENT:  1. 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 Recent MRI notable for protruded disc at L4-5 and an extruded disc at L5-S1. She is now s/p L4-5 L5-S1 decompression stabilization with substantial mprovement of her back and right leg pain.  2. Left knee meniscus injury.  3. Depression with anxiety. Worsening since head trauma  4. Cervical spondylosis/Myofascial pain.- see below  5. Concussion on 8/2 with post concussion syndrome- she has ongoing memory and concentration deficits.  6. 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 [facility]. Will also make a psychiatry referral pending financial access--the patient is checking options. [facility] may be able to offer her more affordable psychiatric care as well.  2.  Consider SIJ blocks in the future. 3. Hydrocodone will continue for breakthrough pain.  4. Elavil 10-20mg  qhs for sleep, mood, pain. Can continue Melatonin to assist with sleep architecture  4. Increase effexor to 225mg  ultimately. We will start with 150mg  initially. 5. Maintain  ritalin for arousal  6 .I'll see her back in about 1 months. 30 minutes of face to face patient care time were spent during this visit. All questions were encouraged and answered.          Assessment & Plan:

## 2014-02-08 DIAGNOSIS — F331 Major depressive disorder, recurrent, moderate: Secondary | ICD-10-CM

## 2014-02-13 ENCOUNTER — Other Ambulatory Visit: Payer: Self-pay | Admitting: Neurological Surgery

## 2014-02-13 DIAGNOSIS — M545 Low back pain, unspecified: Secondary | ICD-10-CM

## 2014-02-16 ENCOUNTER — Encounter: Payer: 59 | Attending: Physical Medicine and Rehabilitation | Admitting: Registered Nurse

## 2014-02-16 ENCOUNTER — Encounter: Payer: Self-pay | Admitting: Registered Nurse

## 2014-02-16 VITALS — BP 138/82 | HR 114 | Resp 14 | Ht 61.0 in | Wt 170.0 lb

## 2014-02-16 DIAGNOSIS — M1990 Unspecified osteoarthritis, unspecified site: Secondary | ICD-10-CM

## 2014-02-16 DIAGNOSIS — F3289 Other specified depressive episodes: Secondary | ICD-10-CM

## 2014-02-16 DIAGNOSIS — F411 Generalized anxiety disorder: Secondary | ICD-10-CM

## 2014-02-16 DIAGNOSIS — M47817 Spondylosis without myelopathy or radiculopathy, lumbosacral region: Secondary | ICD-10-CM

## 2014-02-16 DIAGNOSIS — F329 Major depressive disorder, single episode, unspecified: Secondary | ICD-10-CM | POA: Insufficient documentation

## 2014-02-16 DIAGNOSIS — Z5181 Encounter for therapeutic drug level monitoring: Secondary | ICD-10-CM

## 2014-02-16 DIAGNOSIS — X58XXXA Exposure to other specified factors, initial encounter: Secondary | ICD-10-CM | POA: Insufficient documentation

## 2014-02-16 DIAGNOSIS — M461 Sacroiliitis, not elsewhere classified: Secondary | ICD-10-CM | POA: Insufficient documentation

## 2014-02-16 DIAGNOSIS — M47816 Spondylosis without myelopathy or radiculopathy, lumbar region: Secondary | ICD-10-CM

## 2014-02-16 DIAGNOSIS — F0781 Postconcussional syndrome: Secondary | ICD-10-CM

## 2014-02-16 DIAGNOSIS — B349 Viral infection, unspecified: Secondary | ICD-10-CM

## 2014-02-16 DIAGNOSIS — Z79899 Other long term (current) drug therapy: Secondary | ICD-10-CM

## 2014-02-16 DIAGNOSIS — B9789 Other viral agents as the cause of diseases classified elsewhere: Secondary | ICD-10-CM | POA: Insufficient documentation

## 2014-02-16 MED ORDER — METHYLPHENIDATE HCL 10 MG PO TABS: 10.0000 mg | ORAL_TABLET | Freq: Two times a day (BID) | ORAL | Status: DC

## 2014-02-16 MED ORDER — HYDROCODONE-ACETAMINOPHEN 10-325 MG PO TABS: 1.0000 | ORAL_TABLET | Freq: Four times a day (QID) | ORAL | Status: DC | PRN

## 2014-02-16 NOTE — Progress Notes (Signed)
Subjective:    Patient ID: [name], female    DOB: 03-10-1956, 58 y.o.   MRN: [id number]  HPI: [name] is a 58 year old female who returns for follow up for chronic pain and medication refill. She says her pain is in her lower back and posteriorly left leg. She rates her pain 9. Her current exercise regime is walking.  Today she's feeling very good, she has not had any sucidal ideation in seven days. She is very appreciative of Dr. Naaman Evans sending her to Dr. Valentina Evans. He listened to how she felt. Her husband also spoke to Dr. Valentina Evans and he explained his wife condition to him. She says since her husband has spoken to Dr. Valentina Evans there relationship has improved. At one time she thought she would have to get a divorce.  She will be following Dr. Doyne Evans on June 2nd 2015.  She seen Dr. Ronnald Evans she had a CT Scan on her lower back and he sent her to Dr. Brien Evans and she had a nerve conduction study this morning. She will be following up with Dr. Ronnald Evans in a Evans weeks.  Pain Inventory Average Pain 8 Pain Right Now 9 My pain is sharp, burning, dull, stabbing, tingling, aching and other  In the last 24 hours, has pain interfered with the following? General activity 8 Relation with others 10 Enjoyment of life 10 What TIME of day is your pain at its worst? constant all day Sleep (in general) Fair  Pain is worse with: walking, bending, sitting, inactivity, standing and unsure Pain improves with: rest, heat/ice, medication and other Relief from Meds: 8  Mobility walk with assistance use a cane how many minutes can you walk? 15-30 ability to climb steps?  yes do you drive?  yes transfers alone  Function disabled: date disabled 11/2008 I need assistance with the following:  meal prep, household duties and shopping  Neuro/Psych weakness numbness tremor tingling trouble walking spasms dizziness confusion depression anxiety loss of taste or smell suicidal thoughts  Prior  Studies Any changes since last visit?  yes x-rays nerve study  Physicians involved in your care Any changes since last visit?  no   Family History  Problem Relation Age of Onset  . Cancer Mother     cervical  . Cancer Father     colon   History   Social History  . Marital Status: Married    Spouse Name: N/A    Number of Children: N/A  . Years of Education: N/A   Social History Main Topics  . Smoking status: Never Smoker   . Smokeless tobacco: Never Used  . Alcohol Use: No  . Drug Use: No  . Sexual Activity: Yes   Other Topics Concern  . None   Social History Narrative  . None   Past Surgical History  Procedure Laterality Date  . Abdominal hysterectomy  1999  . Tonsillectomy  1981  . Tubal ligation  1997  . Bunions removed  1988  . Edg  12/17/2004  . Electrocardiogram  05/27/2007  . Skin cancer excision     Past Medical History  Diagnosis Date  . ANXIETY 06/10/2007  . ASYMPTOMATIC POSTMENOPAUSAL STATUS 07/19/2009  . Cough 04/02/2009  . DYSLIPIDEMIA 06/28/2008  . GERD 06/10/2007  . 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 HYPERGLYCEMIA 06/28/2008  . HYPERTENSION 06/10/2007  . Irritable bowel syndrome 06/28/2008  . OSTEOARTHRITIS 06/10/2007  . UPPER RESPIRATORY INFECTION 03/26/2009  . WEIGHT GAIN 01/21/2010   BP 138/82  Pulse 114  Resp 14  Ht 5\' 1"  (1.549 m)  Wt 170 lb (77.111 kg)  BMI 32.14 kg/m2  SpO2 97%  Opioid Risk Score:   Fall Risk Score: Low Fall Risk (0-5 points) (pt educated on fall risk, brochure given to pt previously)   Review of Systems  Constitutional: Positive for diaphoresis and appetite change.  HENT:       Loss of smell or taste  Respiratory: Positive for cough and shortness of breath.   Cardiovascular: Positive for leg swelling.  Gastrointestinal: Positive for nausea and vomiting.  Neurological: Positive for dizziness, tremors, weakness and numbness.       Tingling, spasms  Psychiatric/Behavioral: Positive for suicidal ideas and confusion. The patient is  nervous/anxious.        Depression, pt's last suicidal thought was a week ago. Pt states that Dr. Valentina Evans is helping her a lot.   All other systems reviewed and are negative.      Objective:   Physical Exam  Nursing note and vitals reviewed. Constitutional: She is oriented to person, place, and time. She appears well-developed and well-nourished.  HENT:  Head: Normocephalic and atraumatic.  Neck: Neck supple.  Cardiovascular: Normal rate and regular rhythm.   Pulmonary/Chest: Effort normal and breath sounds normal.  Musculoskeletal:  Normal Muscle Bulk and Muscle Testing reveals: Upper Extremities Full ROM. Muscle Strength:  Right Arm 5/5. Left Arm 4/5. Lumbar Paraspinal Tenderness: L: 4- S-1 Left Gluteus Maximus Tenderness Walks with a Cane Arises from chair with ease.  Neurological: She is alert and oriented to person, place, and time.  Skin: Skin is dry.  Psychiatric: She has a normal mood and affect.  Euphoric abdout how she feels at this time.          Assessment & Plan:  1. History of chronic lumbar facet disease with spondylosis/DDD/ L4-5 spondylolisthesis.With L4-5 L5-S1 decompression stabilization: Refilled: Hydrocodone 10/325mg  one tablet every 6 hours as needed #120. 2. Depression with anxiety:  Continue Xanax, Elavil, and Cymbalta. Has F/U appt with Dr. Doyne Evans on June 2nd. 4. Cervical spondylosis:  Stable at this time with no complaints. Continue Medication Regime. 5. Post Concussion Syndrome: Refilled: Ritalin 10 mg BID #60  20 minutes of face to face patient care time was spent during this visit. All questions were encouraged and answered.  F/U in 1 month

## 2014-02-22 ENCOUNTER — Ambulatory Visit
Admission: RE | Admit: 2014-02-22 | Discharge: 2014-02-22 | Disposition: A | Discharge status: Home / Self Care | Payer: 59 | Source: Ambulatory Visit | Attending: Neurological Surgery | Admitting: Neurological Surgery

## 2014-02-22 DIAGNOSIS — M545 Low back pain, unspecified: Secondary | ICD-10-CM

## 2014-02-28 ENCOUNTER — Telehealth: Payer: Self-pay

## 2014-02-28 NOTE — Telephone Encounter (Signed)
Have a patient contact her insurance company for any suggestions

## 2014-02-28 NOTE — Telephone Encounter (Signed)
Is there another generic for Methylphenidate, which is a generic for Ritalin? Reason I am asking, is because the Methylphenidate costs $35.98 for 60 tablets. (1 month supply)

## 2014-02-28 NOTE — Telephone Encounter (Signed)
Left message for patient to call office to inform her to find out what her insurance will cover for ritalin.

## 2014-03-01 ENCOUNTER — Telehealth: Payer: Self-pay

## 2014-03-01 NOTE — Telephone Encounter (Signed)
Patient returned call to clinic. Contacted patient to advise her to find out what her insurance will cover for ritalin per Zella Ball. Patient states the Methylphenidate is costing her to much.

## 2014-03-08 ENCOUNTER — Telehealth: Payer: Self-pay | Admitting: Endocrinology

## 2014-03-08 NOTE — Telephone Encounter (Signed)
Please offer ov tomorrow, as i need to see the rash

## 2014-03-08 NOTE — Telephone Encounter (Signed)
Pt has terrible bought of poison ivy she is pt of Dr. Loanne Drilling PCP please assist she has been using Fluacinonide USP 0.05 % this was rx to her by her dermatologist a few years back

## 2014-03-08 NOTE — Telephone Encounter (Signed)
Please read note below and advise.  

## 2014-03-09 ENCOUNTER — Encounter: Payer: Self-pay | Admitting: Endocrinology

## 2014-03-09 ENCOUNTER — Ambulatory Visit (INDEPENDENT_AMBULATORY_CARE_PROVIDER_SITE_OTHER): Payer: 59 | Admitting: Endocrinology

## 2014-03-09 VITALS — BP 130/82 | HR 104 | Temp 98.6°F | Ht 61.0 in | Wt 173.0 lb

## 2014-03-09 DIAGNOSIS — L259 Unspecified contact dermatitis, unspecified cause: Secondary | ICD-10-CM

## 2014-03-09 MED ORDER — FLUOCINONIDE 0.05 % EX CREA: 1.0000 "application " | TOPICAL_CREAM | Freq: Four times a day (QID) | CUTANEOUS | Status: DC

## 2014-03-09 MED ORDER — METHYLPREDNISOLONE (PAK) 4 MG PO TABS: ORAL_TABLET | ORAL | Status: DC

## 2014-03-09 NOTE — Telephone Encounter (Signed)
Requested call back to set up appointment.  

## 2014-03-09 NOTE — Patient Instructions (Addendum)
i have sent 2 prescriptions to your pharmacy: skin cream and steroid "pack."   Loratadine (non-prescription) will help your rash also.   I hope you feel better soon.  If you don't feel better by next week, please call back.  Please call sooner if you get worse.

## 2014-03-09 NOTE — Telephone Encounter (Signed)
Pt coming for ov today at 1pm.

## 2014-03-09 NOTE — Progress Notes (Signed)
Subjective:    Patient ID: [name], female    DOB: [birth year], 57 y.o.   MRN: [id number]  HPI Pt states few days of moderate rash throughout the body, and assoc itching.   Past Medical History  Diagnosis Date  . ANXIETY 06/10/2007  . ASYMPTOMATIC POSTMENOPAUSAL STATUS 07/19/2009  . Cough 04/02/2009  . DYSLIPIDEMIA 06/28/2008  . GERD 06/10/2007  . 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 HYPERGLYCEMIA 06/28/2008  . HYPERTENSION 06/10/2007  . Irritable bowel syndrome 06/28/2008  . OSTEOARTHRITIS 06/10/2007  . UPPER RESPIRATORY INFECTION 03/26/2009  . WEIGHT GAIN 01/21/2010    Past Surgical History  Procedure Laterality Date  . Abdominal hysterectomy  1999  . Tonsillectomy  1981  . Tubal ligation  1997  . Bunions removed  1988  . Edg  12/17/2004  . Electrocardiogram  05/27/2007  . Skin cancer excision      History   Social History  . Marital Status: Married    Spouse Name: N/A    Number of Children: N/A  . Years of Education: N/A   Occupational History  . Not on file.   Social History Main Topics  . Smoking status: Never Smoker   . Smokeless tobacco: Never Used  . Alcohol Use: No  . Drug Use: No  . Sexual Activity: Yes   Other Topics Concern  . Not on file   Social History Narrative  . No narrative on file    Current Outpatient Prescriptions on File Prior to Visit  Medication Sig Dispense Refill  . acyclovir (ZOVIRAX) 800 MG tablet Take 1 tablet (800 mg total) by mouth daily.  90 tablet  1  . ALPRAZolam (XANAX) 0.5 MG tablet Take 1 tablet (0.5 mg total) by mouth 3 (three) times daily as needed for anxiety.  90 tablet  2  . amitriptyline (ELAVIL) 10 MG tablet TAKE 1 TO 2 TABLETS BY MOUTH AT BEDTIME  60 tablet  3  . atorvastatin (LIPITOR) 40 MG tablet Take 1 tablet (40 mg total) by mouth daily.  90 tablet  1  . DULoxetine (CYMBALTA) 30 MG capsule Take 1 capsule (30 mg total) by mouth daily.  30 capsule  3  . HYDROcodone-acetaminophen (NORCO) 10-325 MG per tablet Take 1 tablet by mouth  every 6 (six) hours as needed.  120 tablet  0  . lisinopril-hydrochlorothiazide (PRINZIDE,ZESTORETIC) 10-12.5 MG per tablet Take 0.5 tablets by mouth daily.  90 tablet  1  . meclizine (ANTIVERT) 12.5 MG tablet TAKE 1 TABLET (12.5 MG TOTAL) BY MOUTH 3 (THREE) TIMES DAILY AS NEEDED.  45 tablet  2  . methocarbamol (ROBAXIN) 500 MG tablet Take 1 tablet (500 mg total) by mouth every 6 (six) hours as needed for muscle spasms.  90 tablet  3  . methylphenidate (RITALIN) 10 MG tablet Take 1 tablet (10 mg total) by mouth 2 (two) times daily. At 0700 and 1200 daily  60 tablet  0  . omeprazole (PRILOSEC) 40 MG capsule Take 1 capsule (40 mg total) by mouth daily.  90 capsule  2  . traZODone (DESYREL) 100 MG tablet       . venlafaxine XR (EFFEXOR XR) 75 MG 24 hr capsule Take 3 capsules (225 mg total) by mouth daily.  90 capsule  0   No current facility-administered medications on file prior to visit.    No Known Allergies  Family History  Problem Relation Age of Onset  . Cancer Mother     cervical  . Cancer Father  colon    BP 130/82  Pulse 104  Temp(Src) 98.6 F (37 C) (Oral)  Ht 5\' 1"  (1.549 m)  Wt 173 lb (78.472 kg)  BMI 32.70 kg/m2  SpO2 97%  Review of Systems Denies fever.      Objective:   Physical Exam VITAL SIGNS:  See vs page.    GENERAL: no distress.   Skin: moderate eczematous rash throughout the body.        Assessment & Plan:  Contact dermatitis, new

## 2014-03-13 ENCOUNTER — Ambulatory Visit (INDEPENDENT_AMBULATORY_CARE_PROVIDER_SITE_OTHER): Payer: 59 | Admitting: Psychiatry

## 2014-03-13 ENCOUNTER — Encounter (HOSPITAL_COMMUNITY): Payer: Self-pay | Admitting: Psychiatry

## 2014-03-13 VITALS — BP 158/91 | HR 84 | Ht 61.0 in | Wt 171.2 lb

## 2014-03-13 DIAGNOSIS — F0781 Postconcussional syndrome: Secondary | ICD-10-CM

## 2014-03-13 DIAGNOSIS — F411 Generalized anxiety disorder: Secondary | ICD-10-CM

## 2014-03-13 DIAGNOSIS — F4310 Post-traumatic stress disorder, unspecified: Secondary | ICD-10-CM | POA: Insufficient documentation

## 2014-03-13 DIAGNOSIS — F329 Major depressive disorder, single episode, unspecified: Secondary | ICD-10-CM | POA: Insufficient documentation

## 2014-03-13 DIAGNOSIS — F410 Panic disorder [episodic paroxysmal anxiety] without agoraphobia: Secondary | ICD-10-CM | POA: Insufficient documentation

## 2014-03-13 DIAGNOSIS — F3289 Other specified depressive episodes: Secondary | ICD-10-CM

## 2014-03-13 MED ORDER — METHYLPHENIDATE HCL 10 MG PO TABS: 10.0000 mg | ORAL_TABLET | Freq: Two times a day (BID) | ORAL | Status: DC

## 2014-03-13 MED ORDER — VENLAFAXINE HCL ER 150 MG PO CP24: 300.0000 mg | ORAL_CAPSULE | Freq: Every day | ORAL | Status: DC

## 2014-03-13 MED ORDER — ALPRAZOLAM 0.5 MG PO TABS: 0.5000 mg | ORAL_TABLET | Freq: Three times a day (TID) | ORAL | Status: DC | PRN

## 2014-03-13 NOTE — Patient Instructions (Signed)
Continue Xanax 0.5mg  po 3x/day with plan to eventually decrease and taper off.   Increase Venlafaxine to 300mg  daily for mood and anxiety. Pt is aware dose is above FDA recommended dose and will call if SE develop. Pt has agreed to take increased dose.  D/C Amitriptyline due to med interaction  Continue Ritalin 10mg  twice daily b/c pt reports it provides some motivation and energy. Pt is aware that is medication is being used as an adjunct for treatment of depression and it is off label indictation.

## 2014-03-13 NOTE — Progress Notes (Signed)
Psychiatric Assessment Adult  Patient Identification:  [name] Date of Evaluation:  03/13/2014 Chief Complaint: depression and anxiety and pain History of Chief Complaint:   Chief Complaint  Patient presents with  . Anxiety  . Depression    HPI Comments: In 1987 she was engaged to her sole mate and he died in a construction accident after a lot of suffering. Pt was by his side for 40 days in the hospital and he died in her arms. This is when her depression began. Today she still misses him but has tried to move on and has since married 3x. Pt is endorsing multiple stressors including her sole mate's death, being laid off from [employer] in 2010 after 28 yrs and being unable to find another job, distant relationship from her sister and pt's own health/pain.   Today reports she is severely depressed and she doesn't look forward to things and doesn't know how to be happy anymore. Endorsing worthlessness, guilt and poor self confidence and isolation. Also reports frequent crying spells, low motivation, poor libido, anhedonia. Reports racing thoughts with poor sleep. She is getting about 4-6 hrs/night and energy is poor. Appetite and concentration are poor. Reports she is easily distracted and memory is poor. Endorsing hopelessness. Pt is attempted to be more active and recently did some gardening work for her parents. States it made it feel good and she plans to do more in the future. Endorsing passive thoughts of death. Denies SI/HI. 3 months ago she was having active SI with plan but no intention. Pt wants to live for her husband and dog.  Recently put on disability for spinal issues but even this did not relieve her stress. Pt has been on Ritalin for a few months. PCP prescribed it to help with energy and mood. States it does help with motivation and gives her some energy. Reports she has been on Effexor for 3 yrs and once when she stopped it her depression worsened. Pt was on Cymbalta but it caused SI  so she was switched back to Effexor. Pt has been on Xanax for 3+ yrs and she is taking 3x/day. She is unsure if is helping like it used to. Pt has been on Elavil for less than year for sleep and states it is not very effective.   Review of Systems  Constitutional: Positive for activity change, appetite change and fatigue.  HENT: Negative.   Eyes: Negative.   Respiratory: Negative.   Cardiovascular: Negative.   Gastrointestinal: Negative.   Musculoskeletal: Positive for back pain, neck pain and neck stiffness.  Skin: Positive for rash.  Neurological: Negative.   Psychiatric/Behavioral: Positive for sleep disturbance, dysphoric mood and decreased concentration. The patient is nervous/anxious.    Physical Exam  Psychiatric: Her speech is normal and behavior is normal. Judgment and thought content normal. She exhibits a depressed mood. She exhibits abnormal recent memory.    Depressive Symptoms: yes see HPI  (Hypo) Manic Symptoms:   Elevated Mood:  No Irritable Mood:  No Grandiosity:  No Distractibility:  No Labiality of Mood:  No Delusions:  No Hallucinations:  No Impulsivity:  No Sexually Inappropriate Behavior:  No Financial Extravagance:  No Flight of Ideas:  No  Anxiety Symptoms: Excessive Worry:  worrying 24/7. Racing thoughts keep her up and make her tired. Anxiety causes HA and GI upset Panic Symptoms:  Began in HS. Last one was one week ago. States they come on suddenly. Symptoms- sweating, hyperawareness, shaky, trembling, weakness, cheast pain, SOB, palpiations.  Made better by seeing someone familiar. Agoraphobia:  Yes Obsessive Compulsive: No  Symptoms: None, Specific Phobias:  No Social Anxiety:  Yes- public speaking is difficult. Has fainted when forced to make presentations. No fear of social interaction in general  Psychotic Symptoms:  Hallucinations: When lying in bed she hears a group of people mumbling in the other room, like the tv is on. States it only  happens when she is about to fall asleep or when she first wakes up.  Auditory Delusions:  No Paranoia:  No   Ideas of Reference:  No  PTSD Symptoms: Ever had a traumatic exposure:  Yes- death of her finance and his suffering  Had a traumatic exposure in the last month:  No Re-experiencing: Yes Flashbacks Intrusive Thoughts Triggers such as music cause intrusive memories Hypervigilance:  No Hyperarousal: Yes Emotional Numbness/Detachment Irritability/Anger Sleep Avoidance: Yes Decreased Interest/Participation  Traumatic Brain Injury: Yes Blunt Trauma 2 yrs ago she fell while moving the lawn  Past Psychiatric History: Diagnosis: MDD, Anxiety  Hospitalizations: denies  Outpatient Care: therapy with Dr. Crisoforo Oxford  Substance Abuse Care: denies  Self-Mutilation: denies  Suicidal Attempts: denies  Violent Behaviors: denies   Past Medical History:   Past Medical History  Diagnosis Date  . ANXIETY 06/10/2007  . ASYMPTOMATIC POSTMENOPAUSAL STATUS 07/19/2009  . Cough 04/02/2009  . DYSLIPIDEMIA 06/28/2008  . GERD 06/10/2007  . 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 HYPERGLYCEMIA 06/28/2008  . HYPERTENSION 06/10/2007  . Irritable bowel syndrome 06/28/2008  . OSTEOARTHRITIS 06/10/2007  . UPPER RESPIRATORY INFECTION 03/26/2009  . WEIGHT GAIN 01/21/2010  . Degenerative arthritis of spine     back and neck   History of Loss of Consciousness:  No Seizure History:  No Cardiac History:  No Allergies:  No Known Allergies Current Medications:  Current Outpatient Prescriptions  Medication Sig Dispense Refill  . acyclovir (ZOVIRAX) 800 MG tablet Take 1 tablet (800 mg total) by mouth daily.  90 tablet  1  . ALPRAZolam (XANAX) 0.5 MG tablet Take 1 tablet (0.5 mg total) by mouth 3 (three) times daily as needed for anxiety.  90 tablet  2  . amitriptyline (ELAVIL) 10 MG tablet TAKE 1 TO 2 TABLETS BY MOUTH AT BEDTIME  60 tablet  3  . atorvastatin (LIPITOR) 40 MG tablet Take 1 tablet (40 mg total) by mouth daily.  90  tablet  1  . fluocinonide cream (LIDEX) 0.27 % Apply 1 application topically 4 (four) times daily. As needed for rash  60 g  1  . HYDROcodone-acetaminophen (NORCO) 10-325 MG per tablet Take 1 tablet by mouth every 6 (six) hours as needed.  120 tablet  0  . lisinopril-hydrochlorothiazide (PRINZIDE,ZESTORETIC) 10-12.5 MG per tablet Take 0.5 tablets by mouth daily.  90 tablet  1  . meclizine (ANTIVERT) 12.5 MG tablet TAKE 1 TABLET (12.5 MG TOTAL) BY MOUTH 3 (THREE) TIMES DAILY AS NEEDED.  45 tablet  2  . methocarbamol (ROBAXIN) 500 MG tablet Take 1 tablet (500 mg total) by mouth every 6 (six) hours as needed for muscle spasms.  90 tablet  3  . methylphenidate (RITALIN) 10 MG tablet Take 1 tablet (10 mg total) by mouth 2 (two) times daily. At 0700 and 1200 daily  60 tablet  0  . methylPREDNIsolone (MEDROL DOSPACK) 4 MG tablet follow package directions  21 tablet  0  . omeprazole (PRILOSEC) 40 MG capsule Take 1 capsule (40 mg total) by mouth daily.  90 capsule  2  . venlafaxine XR (  EFFEXOR XR) 75 MG 24 hr capsule Take 3 capsules (225 mg total) by mouth daily.  90 capsule  0  . DULoxetine (CYMBALTA) 30 MG capsule Take 1 capsule (30 mg total) by mouth daily.  30 capsule  3  . traZODone (DESYREL) 100 MG tablet        No current facility-administered medications for this visit.    Previous Psychotropic Medications:  Medication Dose   Cymbalta caused SI                      Substance Abuse History in the last 12 months: Substance Age of 1st Use Last Use Amount Specific Type  Nicotine  denies       Alcohol  denies        Cannabis  denies        Opiates  denies        Cocaine  denies        Methamphetamines  denies        LSD  denies        Ecstasy  denies         Benzodiazepines  denies        Caffeine   today 2-3 glasses/day Decaf tea  Inhalants  denies        Others: denies                         Medical Consequences of Substance Abuse: denies  Legal Consequences of Substance  Abuse: denies  Family Consequences of Substance Abuse: denies  Blackouts:  No DT's:  No Withdrawal Symptoms:  No None  Social History: Current Place of Residence: [place], [place] living with husband Place of Birth: [place] Family Members: parents, 2 older sister Marital Status:  married 3 times, current husband 10 yrs. Children: 0  Sons:   Daughters: 1 step daugter Relationships: denies Education:  HS Soil scientist Problems/Performance: didn't like school Religious Beliefs/Practices: [employer] History of Abuse: none Occupational Experiences: [employer] for 28 yrs, fired in 2010 when department closed, now on Science writer History:  None. Legal History: denies Hobbies/Interests: photography and gardening.  Family History:   Family History  Problem Relation Age of Onset  . Cancer Mother     cervical  . Cancer Father     colon  . Anxiety disorder Maternal Aunt   . Depression Maternal Aunt   . Anxiety disorder Maternal Uncle   . Depression Maternal Uncle     suicide attempt by gun shot wound to head. Survived  . Alcohol abuse Other   . Drug abuse Other     Mental Status Examination/Evaluation: Objective:  Appearance: Casual  Eye Contact::  Good  Speech:  Clear and Coherent  Volume:  Normal  Mood:  Depressed   Affect:  Congruent and Tearful  Thought Process:  Intact, Linear and Logical  Orientation:  Full (Time, Place, and Person)  Thought Content:  WDL  Suicidal Thoughts:  No  Homicidal Thoughts:  No  Judgement:  Fair  Insight:  Fair  Psychomotor Activity:  Normal  Akathisia:  No  Handed:  Right  AIMS (if indicated):  n/a  Assets:  Communication Skills Desire for [place] Talents/Skills Transportation    Laboratory/X-Ray Psychological Evaluation(s)   will order denies   Assessment:  MDD; GAD; Panic disorder, PTSD  AXIS I MDD; GAD; Panic disorder, PTSD  AXIS II Deferred  AXIS III  Past Medical History  Diagnosis Date  . ANXIETY 06/10/2007  . ASYMPTOMATIC POSTMENOPAUSAL STATUS 07/19/2009  . Cough 04/02/2009  . DYSLIPIDEMIA 06/28/2008  . GERD 06/10/2007  . 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 HYPERGLYCEMIA 06/28/2008  . HYPERTENSION 06/10/2007  . Irritable bowel syndrome 06/28/2008  . OSTEOARTHRITIS 06/10/2007  . UPPER RESPIRATORY INFECTION 03/26/2009  . WEIGHT GAIN 01/21/2010  . Degenerative arthritis of spine     back and neck     AXIS IV other psychosocial or environmental problems and health stressors  AXIS V 51-60 moderate symptoms   Treatment Plan/Recommendations:  Plan of Care:  Continue meds, risks/benefits and SE of the medication discussed. Pt verbalized understanding and verbal consent obtained for treatment.  Affirm with the patient that the medications are taken as ordered. Patient expressed understanding of how their medications were to be used.   Confidentiality and exclusions reviewed with pt who verbalized understanding.   Laboratory:  CBC Chemistry Profile Folic Acid GGT HbAIC UDS UA Tsh; T3/T4  Psychotherapy: Therapy: brief supportive therapy provided. Discussed psychosocial stressors in detail.     Medications: Continue Xanax 0.5mg  po TID prn with plan to eventually decrease and taper off.   Increase Venlafaxine to 300mg  po qD for mood and anxiety. Pt is aware dose is above FDA recommended dose and will call if SE develop. Pt has agreed to take increased dose.  D/C Amitriptyline due to med interaction  Continue Ritalin 10mg  po BID b/c pt reports it provides some motivation and energy. Pt is aware that is medication is being used as an adjunct for treatment of depression and it is off label indictation. Pt is aware that I will be taking over management of Ritalin and she needs to call at least 5 days prior to running out for refill.   Routine PRN Medications:  Yes  Consultations: encouraged to continue individual therapy with Dr. Valentina Shaggy  Safety Concerns: Pt  denies SI and is at an acute low risk for suicide.Patient told to call clinic if any problems occur. Patient advised to go to ER  if she should develop SI/HI, side effects, or if symptoms worsen. Has crisis numbers to call if needed. Pt verbalized understanding.    Other:  F/up in 2  months or sooner if needed     Charlcie Cradle, MD 6/2/201510:18 AM

## 2014-03-15 ENCOUNTER — Encounter: Payer: Self-pay | Admitting: Endocrinology

## 2014-03-16 ENCOUNTER — Encounter: Payer: 59 | Attending: Physical Medicine and Rehabilitation | Admitting: Registered Nurse

## 2014-03-16 ENCOUNTER — Encounter: Payer: Self-pay | Admitting: Registered Nurse

## 2014-03-16 VITALS — BP 155/85 | HR 110 | Resp 14 | Ht 61.0 in | Wt 170.0 lb

## 2014-03-16 DIAGNOSIS — B9789 Other viral agents as the cause of diseases classified elsewhere: Secondary | ICD-10-CM

## 2014-03-16 DIAGNOSIS — M1990 Unspecified osteoarthritis, unspecified site: Secondary | ICD-10-CM | POA: Insufficient documentation

## 2014-03-16 DIAGNOSIS — F411 Generalized anxiety disorder: Secondary | ICD-10-CM | POA: Insufficient documentation

## 2014-03-16 DIAGNOSIS — M47816 Spondylosis without myelopathy or radiculopathy, lumbar region: Secondary | ICD-10-CM

## 2014-03-16 DIAGNOSIS — Z79899 Other long term (current) drug therapy: Secondary | ICD-10-CM

## 2014-03-16 DIAGNOSIS — B349 Viral infection, unspecified: Secondary | ICD-10-CM

## 2014-03-16 DIAGNOSIS — F0781 Postconcussional syndrome: Secondary | ICD-10-CM

## 2014-03-16 DIAGNOSIS — F3289 Other specified depressive episodes: Secondary | ICD-10-CM

## 2014-03-16 DIAGNOSIS — F329 Major depressive disorder, single episode, unspecified: Secondary | ICD-10-CM | POA: Insufficient documentation

## 2014-03-16 DIAGNOSIS — M461 Sacroiliitis, not elsewhere classified: Secondary | ICD-10-CM

## 2014-03-16 DIAGNOSIS — M47817 Spondylosis without myelopathy or radiculopathy, lumbosacral region: Secondary | ICD-10-CM | POA: Insufficient documentation

## 2014-03-16 DIAGNOSIS — X58XXXA Exposure to other specified factors, initial encounter: Secondary | ICD-10-CM | POA: Insufficient documentation

## 2014-03-16 DIAGNOSIS — Z5181 Encounter for therapeutic drug level monitoring: Secondary | ICD-10-CM | POA: Insufficient documentation

## 2014-03-16 MED ORDER — HYDROCODONE-ACETAMINOPHEN 10-325 MG PO TABS: 1.0000 | ORAL_TABLET | Freq: Four times a day (QID) | ORAL | Status: DC | PRN

## 2014-03-16 NOTE — Progress Notes (Signed)
Subjective:    Patient ID: [name], female    DOB: [birth year], 57 y.o.   MRN: [id number]  HPI:[name] is a 58 year old female who returns for follow up for chronic pain and medication refill. She says her pain is located in her lower back, left buttock and left leg. She rates her pain 8. Her current exercise regime is walking and gardening.  About three weeks ago she was cleaning her parents yard and came in contact with poison ivy her PMD prescribed a [place] and she has been feeling better.  She seen Dr. Volanda Napoleon on June 2nd she discontinued the elavil and increased her effexor dose to 300 mg daily. She also wrote her prescriptions for Ritalin and Xanax.    Pain Inventory Average Pain 8 Pain Right Now 8 My pain is constant, sharp, burning, dull, stabbing, tingling and aching  In the last 24 hours, has pain interfered with the following? General activity 10 Relation with others 10 Enjoyment of life 10 What TIME of day is your pain at its worst? varies Sleep (in general) Fair  Pain is worse with: walking, bending, sitting, inactivity, standing, unsure and some activites Pain improves with: rest, heat/ice, pacing activities and medication Relief from Meds: 5  Mobility walk with assistance use a cane how many minutes can you walk? 30 to 1 hour ability to climb steps?  no do you drive?  yes transfers alone  Function disabled: date disabled 02/2014 I need assistance with the following:  dressing, meal prep, household duties and shopping  Neuro/Psych bladder control problems weakness numbness tremor tingling trouble walking spasms dizziness confusion depression anxiety loss of taste or smell suicidal thoughts  Prior Studies Any changes since last visit?  yes x-rays CT/MRI nerve study  Physicians involved in your care Any changes since last visit?  yes Psychiatrist Dr. Doyne Keel   Family History  Problem Relation Age of Onset  . Cancer  Mother     cervical  . Cancer Father     colon  . Anxiety disorder Maternal Aunt   . Depression Maternal Aunt   . Anxiety disorder Maternal Uncle   . Depression Maternal Uncle     suicide attempt by gun shot wound to head. Survived  . Alcohol abuse Other   . Drug abuse Other    History   Social History  . Marital Status: Married    Spouse Name: N/A    Number of Children: N/A  . Years of Education: N/A   Social History Main Topics  . Smoking status: Never Smoker   . Smokeless tobacco: Never Used  . Alcohol Use: No  . Drug Use: No  . Sexual Activity: Yes   Other Topics Concern  . None   Social History Narrative  . None   Past Surgical History  Procedure Laterality Date  . Abdominal hysterectomy  1999  . Tonsillectomy  1981  . Tubal ligation  1997  . Bunions removed  1988  . Edg  12/17/2004  . Electrocardiogram  05/27/2007  . Skin cancer excision    . Cholecystectomy  04/25/2008  . C4 c5 cage insertion  06/28/2013  . L5 s1 rod insertion  11/07/2012   Past Medical History  Diagnosis Date  . ANXIETY 06/10/2007  . ASYMPTOMATIC POSTMENOPAUSAL STATUS 07/19/2009  . Cough 04/02/2009  . DYSLIPIDEMIA 06/28/2008  . GERD 06/10/2007  . 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 HYPERGLYCEMIA 06/28/2008  . HYPERTENSION 06/10/2007  . Irritable bowel syndrome  06/28/2008  . OSTEOARTHRITIS 06/10/2007  . UPPER RESPIRATORY INFECTION 03/26/2009  . WEIGHT GAIN 01/21/2010  . Degenerative arthritis of spine     back and neck   BP 155/85  Pulse 110  Resp 14  Ht 5\' 1"  (1.549 m)  Wt 170 lb (77.111 kg)  BMI 32.14 kg/m2  SpO2 96%  Opioid Risk Score:   Fall Risk Score: Moderate Fall Risk (6-13 points) (pt educated on fall risk, brochure given to pt previously)   Review of Systems  Constitutional: Positive for appetite change and unexpected weight change.  Respiratory: Positive for cough and shortness of breath.   Cardiovascular: Positive for leg swelling.  Gastrointestinal: Positive for nausea and abdominal  pain.  Genitourinary:       Bladder control problems  Musculoskeletal: Positive for back pain.  Neurological: Positive for dizziness, tremors, weakness and numbness.       Tingling, spasms  Psychiatric/Behavioral: Positive for suicidal ideas and confusion. The patient is nervous/anxious.        Depression Suicidal thoughts- pt is currently seeing a phychiatrist  All other systems reviewed and are negative.      Objective:   Physical Exam  Constitutional: She is oriented to person, place, and time. She appears well-developed and well-nourished.  HENT:  Head: Normocephalic and atraumatic.  Neck: Normal range of motion. Neck supple.  Cardiovascular: Normal rate and regular rhythm.   Pulmonary/Chest: Effort normal and breath sounds normal.  Musculoskeletal:  Normal Muscle Bulk and Muscle testing Reveals: Upper and Lower Extremities Full ROM and Muscle Strength 5/5 Spine forward flexion 80 degrees and extension 20 degres Left gluteal Maximus tenderness noted  Arises from chair with ease Narrow Based gait   Neurological: She is alert and oriented to person, place, and time.  Skin: Skin is warm and dry.  Psychiatric: She has a normal mood and affect.  Feels very good, glad she has been working with Dr, Meda Coffee and Dr. Doyne Keel. She and her husband are doing well this has affected her outcome           Assessment & Plan:  1. History of chronic lumbar facet disease with spondylosis/DDD/ L4-5 spondylolisthesis.With L4-5 L5-S1 decompression stabilization: Refilled: Hydrocodone 10/325mg  one tablet every 6 hours as needed #120.  2. Depression with anxiety: Continue Xanax, and Cymbalta. Following  Dr. Doyne Keel and Dr. Valentina Shaggy 4. Cervical spondylosis: Stable at this time with no complaints. Continue Medication Regime.  5. Post Concussion Syndrome: Dr. Doyne Keel wrote prescription for Ritalin  20 minutes of face to face patient care time was spent during this visit. All questions were  encouraged and answered.   F/U in 1 month

## 2014-03-18 ENCOUNTER — Encounter: Payer: Self-pay | Admitting: Physical Medicine & Rehabilitation

## 2014-03-22 ENCOUNTER — Other Ambulatory Visit: Payer: Self-pay | Admitting: Physical Medicine & Rehabilitation

## 2014-03-22 LAB — CBC WITH DIFFERENTIAL/PLATELET
Basophils Absolute: 0 10*3/uL (ref 0.0–0.1)
Basophils Relative: 1 % (ref 0–1)
EOS PCT: 2 % (ref 0–5)
Eosinophils Absolute: 0.1 10*3/uL (ref 0.0–0.7)
HEMATOCRIT: 35.2 % — AB (ref 36.0–46.0)
HEMOGLOBIN: 11.5 g/dL — AB (ref 12.0–15.0)
LYMPHS ABS: 1.4 10*3/uL (ref 0.7–4.0)
Lymphocytes Relative: 29 % (ref 12–46)
MCH: 24.4 pg — AB (ref 26.0–34.0)
MCHC: 32.7 g/dL (ref 30.0–36.0)
MCV: 74.7 fL — AB (ref 78.0–100.0)
MONO ABS: 0.3 10*3/uL (ref 0.1–1.0)
MONOS PCT: 7 % (ref 3–12)
NEUTROS PCT: 61 % (ref 43–77)
Neutro Abs: 2.9 10*3/uL (ref 1.7–7.7)
Platelets: 225 10*3/uL (ref 150–400)
RBC: 4.71 MIL/uL (ref 3.87–5.11)
RDW: 16.2 % — ABNORMAL HIGH (ref 11.5–15.5)
WBC: 4.8 10*3/uL (ref 4.0–10.5)

## 2014-03-23 LAB — COMPLETE METABOLIC PANEL WITH GFR
ALT: 20 U/L (ref 0–35)
AST: 21 U/L (ref 0–37)
Albumin: 4.6 g/dL (ref 3.5–5.2)
Alkaline Phosphatase: 88 U/L (ref 39–117)
BILIRUBIN TOTAL: 0.4 mg/dL (ref 0.2–1.2)
BUN: 8 mg/dL (ref 6–23)
CO2: 29 mEq/L (ref 19–32)
Calcium: 9.8 mg/dL (ref 8.4–10.5)
Chloride: 101 mEq/L (ref 96–112)
Creat: 0.86 mg/dL (ref 0.50–1.10)
GFR, Est African American: 87 mL/min
GFR, Est Non African American: 76 mL/min
GLUCOSE: 109 mg/dL — AB (ref 70–99)
Potassium: 3.8 mEq/L (ref 3.5–5.3)
SODIUM: 142 meq/L (ref 135–145)
TOTAL PROTEIN: 6.8 g/dL (ref 6.0–8.3)

## 2014-03-23 LAB — TSH: TSH: 3.757 u[IU]/mL (ref 0.350–4.500)

## 2014-03-23 LAB — FOLATE: Folate: 7.8 ng/mL

## 2014-03-23 LAB — T4, FREE: Free T4: 1.05 ng/dL (ref 0.80–1.80)

## 2014-03-23 LAB — HEMOGLOBIN A1C
HEMOGLOBIN A1C: 6 % — AB (ref <5.7)
Mean Plasma Glucose: 126 mg/dL — ABNORMAL HIGH (ref <117)

## 2014-03-23 LAB — URINALYSIS
BILIRUBIN URINE: NEGATIVE
GLUCOSE, UA: NEGATIVE mg/dL
HGB URINE DIPSTICK: NEGATIVE
Ketones, ur: NEGATIVE mg/dL
Leukocytes, UA: NEGATIVE
Nitrite: NEGATIVE
PH: 6 (ref 5.0–8.0)
Protein, ur: NEGATIVE mg/dL
Specific Gravity, Urine: 1.021 (ref 1.005–1.030)
Urobilinogen, UA: 0.2 mg/dL (ref 0.0–1.0)

## 2014-03-23 LAB — DRUG SCREEN, URINE
AMPHETAMINE SCRN UR: NEGATIVE
Barbiturate Quant, Ur: NEGATIVE
Benzodiazepines.: POSITIVE — AB
CREATININE, U: 149.25 mg/dL
Cocaine Metabolites: NEGATIVE
Marijuana Metabolite: NEGATIVE
Methadone: NEGATIVE
OPIATES: POSITIVE — AB
Phencyclidine (PCP): NEGATIVE
Propoxyphene: NEGATIVE

## 2014-03-23 LAB — T3: T3, Total: 130.2 ng/dL (ref 80.0–204.0)

## 2014-03-23 LAB — GAMMA GT: GGT: 35 U/L (ref 7–51)

## 2014-04-04 ENCOUNTER — Telehealth: Payer: Self-pay

## 2014-04-04 ENCOUNTER — Other Ambulatory Visit: Payer: Self-pay

## 2014-04-04 ENCOUNTER — Encounter (HOSPITAL_BASED_OUTPATIENT_CLINIC_OR_DEPARTMENT_OTHER): Payer: 59 | Admitting: Physical Medicine & Rehabilitation

## 2014-04-04 ENCOUNTER — Encounter: Payer: Self-pay | Admitting: Physical Medicine & Rehabilitation

## 2014-04-04 VITALS — BP 138/79 | HR 106 | Resp 14 | Ht 61.0 in | Wt 170.0 lb

## 2014-04-04 DIAGNOSIS — F332 Major depressive disorder, recurrent severe without psychotic features: Secondary | ICD-10-CM

## 2014-04-04 DIAGNOSIS — F0781 Postconcussional syndrome: Secondary | ICD-10-CM

## 2014-04-04 DIAGNOSIS — F411 Generalized anxiety disorder: Secondary | ICD-10-CM

## 2014-04-04 DIAGNOSIS — M461 Sacroiliitis, not elsewhere classified: Secondary | ICD-10-CM

## 2014-04-04 DIAGNOSIS — F3289 Other specified depressive episodes: Secondary | ICD-10-CM

## 2014-04-04 DIAGNOSIS — F329 Major depressive disorder, single episode, unspecified: Secondary | ICD-10-CM

## 2014-04-04 DIAGNOSIS — M47817 Spondylosis without myelopathy or radiculopathy, lumbosacral region: Secondary | ICD-10-CM

## 2014-04-04 DIAGNOSIS — F4310 Post-traumatic stress disorder, unspecified: Secondary | ICD-10-CM

## 2014-04-04 DIAGNOSIS — B349 Viral infection, unspecified: Secondary | ICD-10-CM

## 2014-04-04 DIAGNOSIS — B9789 Other viral agents as the cause of diseases classified elsewhere: Secondary | ICD-10-CM

## 2014-04-04 DIAGNOSIS — M47816 Spondylosis without myelopathy or radiculopathy, lumbar region: Secondary | ICD-10-CM

## 2014-04-04 MED ORDER — METHYLPHENIDATE HCL 10 MG PO TABS: 10.0000 mg | ORAL_TABLET | Freq: Three times a day (TID) | ORAL | Status: DC

## 2014-04-04 MED ORDER — HYDROCODONE-ACETAMINOPHEN 10-325 MG PO TABS: 1.0000 | ORAL_TABLET | Freq: Four times a day (QID) | ORAL | Status: DC | PRN

## 2014-04-04 NOTE — Telephone Encounter (Signed)
Left message for patient to call office to schedule appointment.

## 2014-04-04 NOTE — Patient Instructions (Signed)
NOTES FOR PSYCHIATRY:  NOT SLEEPING WELL----WHAT ARE PSYCHIATRIST'S SUGGESTIONS?  -COULD YOU RESUME ELAVIL?  -CONSIDER LOW DOSE SEROQUEL (25-50MG )    I WANT YOU GETTING OUT OF THE HOUSE, WORKING ON RANGE OF MOTION AND EXERCISE.Marland Kitchen   FOCUS ON STRETCHING AND LENGTHENING YOUR BACK. BENDING EXERCISES WILL HELP YOU THE MOST.

## 2014-04-04 NOTE — Progress Notes (Signed)
Subjective:    Patient ID: [name], female    DOB: 23-Feb-1956, 58 y.o.   MRN: [id number]  HPI  [name] is back regarding her chronic pain and post-concussion issues. She states that she's been doing fairly well until she went through a psych-counseling session which was traumatic for her (related an experience where she lost a loved-one).  Her elavil was stopped and her effexor was increased by behavioral health. She feels that the increasd effexor has helped her anxiety and mood. . She is not sleeping as well however. She is taking her otc melatonin.   Ritalin works well for focus and attention but she feels that she needs an extra dose late in the day.   Dr. Ronnald Ramp sent her for head CT and NCS but the patient is unaware of the results. She isworking on some basic back exercises and stretches per NS direction.  She is still not getting out of the house much. She is involved a bit with her church.     Pain Inventory Average Pain 7 Pain Right Now 7 My pain is constant, sharp, burning, dull, stabbing, tingling and aching  In the last 24 hours, has pain interfered with the following? General activity 10 Relation with others 10 Enjoyment of life 10 What TIME of day is your pain at its worst? constant all day Sleep (in general) Poor  Pain is worse with: walking, sitting, standing, unsure and some activites Pain improves with: rest, pacing activities and medication Relief from Meds: 5  Mobility walk with assistance use a cane how many minutes can you walk? 30 ability to climb steps?  yes do you drive?  yes transfers alone  Function disabled: date disabled na I need assistance with the following:  meal prep, household duties and shopping  Neuro/Psych weakness numbness spasms dizziness confusion depression anxiety loss of taste or smell  Prior Studies Any changes since last visit?  yes CT/MRI nerve study  Physicians involved in your care Any changes since last  visit?  no   Family History  Problem Relation Age of Onset  . Cancer Mother     cervical  . Cancer Father     colon  . Anxiety disorder Maternal Aunt   . Depression Maternal Aunt   . Anxiety disorder Maternal Uncle   . Depression Maternal Uncle     suicide attempt by gun shot wound to head. Survived  . Alcohol abuse Other   . Drug abuse Other    History   Social History  . Marital Status: Married    Spouse Name: N/A    Number of Children: N/A  . Years of Education: N/A   Social History Main Topics  . Smoking status: Never Smoker   . Smokeless tobacco: Never Used  . Alcohol Use: No  . Drug Use: No  . Sexual Activity: Yes   Other Topics Concern  . None   Social History Narrative  . None   Past Surgical History  Procedure Laterality Date  . Abdominal hysterectomy  1999  . Tonsillectomy  1981  . Tubal ligation  1997  . Bunions removed  1988  . Edg  12/17/2004  . Electrocardiogram  05/27/2007  . Skin cancer excision    . Cholecystectomy  04/25/2008  . C4 c5 cage insertion  06/28/2013  . L5 s1 rod insertion  11/07/2012   Past Medical History  Diagnosis Date  . ANXIETY 06/10/2007  . ASYMPTOMATIC POSTMENOPAUSAL STATUS 07/19/2009  . Cough 04/02/2009  .  DYSLIPIDEMIA 06/28/2008  . GERD 06/10/2007  . 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 HYPERGLYCEMIA 06/28/2008  . HYPERTENSION 06/10/2007  . Irritable bowel syndrome 06/28/2008  . OSTEOARTHRITIS 06/10/2007  . UPPER RESPIRATORY INFECTION 03/26/2009  . WEIGHT GAIN 01/21/2010  . Degenerative arthritis of spine     back and neck   BP 138/79  Pulse 106  Resp 14  Ht 5\' 1"  (1.549 m)  Wt 170 lb (77.111 kg)  BMI 32.14 kg/m2  SpO2 96%  Opioid Risk Score:   Fall Risk Score: Moderate Fall Risk (6-13 points) (pt educated on fall risk, brochure given to pt previously)    Review of Systems  Constitutional: Positive for appetite change.  HENT:       Loss of taste or smell  Respiratory: Positive for cough and shortness of breath.   Cardiovascular:  Positive for leg swelling.  Gastrointestinal: Positive for nausea.  Musculoskeletal: Positive for back pain.  Skin: Positive for rash.  Neurological: Positive for dizziness, weakness and numbness.       Spasms  Psychiatric/Behavioral: Positive for confusion. The patient is nervous/anxious.        Depression  All other systems reviewed and are negative.      Objective:   Physical Exam Constitutional: She is oriented to person, place, and time. She appears well-developed and well-nourished. . Has gained weight.  HENT:  Head: Normocephalic and atraumatic.  Eyes: Conjunctivae and EOM are normal. Pupils are equal, round, and reactive to light.  Neck: Normal range of motion.  Cardiovascular: Normal rate.  Pulmonary/Chest: Effort normal and breath sounds normal.  Abdominal: Soft. Bowel sounds are normal.  Musculoskeletal: Normal range of motion.    Back tender to palpation and extension today. Flexion relieved her pain. SIJ, PSIS's are painful.     lower lumbar paraspinals are tight. Lumbar flexion is poor. Hamstrings are tight.  Neurological: memory and attention are better but still needs redirection at times. . Strength symmetrical. Sensory is normal. DTR's 1+.  Skin: Skin is warm.  Psychiatric: affect better but still anxious.  Assessment & Plan:    ASSESSMENT:  1. 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 Recent MRI notable for protruded disc at L4-5 and an extruded disc at L5-S1. She is now s/p L4-5 L5-S1 decompression stabilization with substantial mprovement of her back and right leg pain.  2. Left knee meniscus injury.  3. Depression with anxiety. Worsening since head trauma  4. Cervical spondylosis/Myofascial pain.- see below  5. Concussion on 8/2 with post concussion syndrome- she has ongoing memory and concentration deficits.  6. 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 Continue per  Psychiatry at [facility]. I asked [name] to discuss with MD regarding the resumption of elavil or initiation of seroquel to help with sleep and perhaps to further augment her mood..  2. Reviewed appropriate exercises for her low back. Furthermore, i want her to work on getting out of her house and walking/exercising each day. She has a fear of leaving the house still.   3. Hydrocodone will continue for breakthrough pain.  4.  Can continue Melatonin to assist with sleep architecture  4. Maintain effexor 300mg  qd per psychiatry.   5. Increase ritalin to 10mg  tid for attention/arousal---she shouldn't take the last dose after 5pm. 6 .Follow up in about 1 months. 30 minutes of face to face patient care time were spent during this visit. All questions were encouraged and  answered.

## 2014-04-16 ENCOUNTER — Ambulatory Visit: Payer: Self-pay | Admitting: Registered Nurse

## 2014-05-04 ENCOUNTER — Encounter: Payer: 59 | Attending: Physical Medicine and Rehabilitation | Admitting: Registered Nurse

## 2014-05-04 ENCOUNTER — Encounter: Payer: Self-pay | Admitting: Registered Nurse

## 2014-05-04 VITALS — BP 139/76 | HR 95 | Resp 14 | Wt 168.0 lb

## 2014-05-04 DIAGNOSIS — F329 Major depressive disorder, single episode, unspecified: Secondary | ICD-10-CM | POA: Diagnosis present

## 2014-05-04 DIAGNOSIS — F4310 Post-traumatic stress disorder, unspecified: Secondary | ICD-10-CM

## 2014-05-04 DIAGNOSIS — F0781 Postconcussional syndrome: Secondary | ICD-10-CM

## 2014-05-04 DIAGNOSIS — M461 Sacroiliitis, not elsewhere classified: Secondary | ICD-10-CM

## 2014-05-04 DIAGNOSIS — Z5181 Encounter for therapeutic drug level monitoring: Secondary | ICD-10-CM

## 2014-05-04 DIAGNOSIS — X58XXXA Exposure to other specified factors, initial encounter: Secondary | ICD-10-CM | POA: Insufficient documentation

## 2014-05-04 DIAGNOSIS — B9789 Other viral agents as the cause of diseases classified elsewhere: Secondary | ICD-10-CM | POA: Diagnosis present

## 2014-05-04 DIAGNOSIS — B349 Viral infection, unspecified: Secondary | ICD-10-CM

## 2014-05-04 DIAGNOSIS — F411 Generalized anxiety disorder: Secondary | ICD-10-CM

## 2014-05-04 DIAGNOSIS — Z79899 Other long term (current) drug therapy: Secondary | ICD-10-CM

## 2014-05-04 DIAGNOSIS — M47817 Spondylosis without myelopathy or radiculopathy, lumbosacral region: Secondary | ICD-10-CM

## 2014-05-04 DIAGNOSIS — F332 Major depressive disorder, recurrent severe without psychotic features: Secondary | ICD-10-CM

## 2014-05-04 DIAGNOSIS — F3289 Other specified depressive episodes: Secondary | ICD-10-CM | POA: Diagnosis present

## 2014-05-04 DIAGNOSIS — M47816 Spondylosis without myelopathy or radiculopathy, lumbar region: Secondary | ICD-10-CM

## 2014-05-04 MED ORDER — ALPRAZOLAM 0.5 MG PO TABS: 0.5000 mg | ORAL_TABLET | Freq: Three times a day (TID) | ORAL | Status: DC | PRN

## 2014-05-04 MED ORDER — HYDROCODONE-ACETAMINOPHEN 10-325 MG PO TABS: 1.0000 | ORAL_TABLET | Freq: Four times a day (QID) | ORAL | Status: DC | PRN

## 2014-05-04 MED ORDER — METHYLPHENIDATE HCL 10 MG PO TABS: 10.0000 mg | ORAL_TABLET | Freq: Three times a day (TID) | ORAL | Status: DC

## 2014-05-04 NOTE — Progress Notes (Signed)
Subjective:    Patient ID: [name], female    DOB: 16-Jul-1956, 58 y.o.   MRN: [id number]  HPI: [name] is a 58 year old female who returns for follow up for chronic pain and medication refill. She says her pain is located between her shoulder blades. She rates her pain 8. Her current exercise regime is walking short distances and performing stretching exercises. She has become more active with outside activities. She is feeling better about herself. She is still attending counseling.  Pain Inventory Average Pain 8 Pain Right Now 8 My pain is sharp, burning, dull, stabbing and aching  In the last 24 hours, has pain interfered with the following? General activity 10 Relation with others 10 Enjoyment of life 10 What TIME of day is your pain at its worst? morning evening and night Sleep (in general) Fair  Pain is worse with: bending, sitting, inactivity and unsure Pain improves with: rest and medication Relief from Meds: 8  Mobility walk without assistance walk with assistance use a cane how many minutes can you walk? 30 ability to climb steps?  yes do you drive?  yes  Function disabled: date disabled . I need assistance with the following:  meal prep, household duties and shopping  Neuro/Psych weakness numbness tremor tingling trouble walking spasms dizziness confusion depression anxiety loss of taste or smell  Prior Studies Any changes since last visit?  no  Physicians involved in your care Any changes since last visit?  no   Family History  Problem Relation Age of Onset  . Cancer Mother     cervical  . Cancer Father     colon  . Anxiety disorder Maternal Aunt   . Depression Maternal Aunt   . Anxiety disorder Maternal Uncle   . Depression Maternal Uncle     suicide attempt by gun shot wound to head. Survived  . Alcohol abuse Other   . Drug abuse Other    History   Social History  . Marital Status: Married    Spouse Name: N/A   Number of Children: N/A  . Years of Education: N/A   Social History Main Topics  . Smoking status: Never Smoker   . Smokeless tobacco: Never Used  . Alcohol Use: No  . Drug Use: No  . Sexual Activity: Yes   Other Topics Concern  . None   Social History Narrative  . None   Past Surgical History  Procedure Laterality Date  . Abdominal hysterectomy  1999  . Tonsillectomy  1981  . Tubal ligation  1997  . Bunions removed  1988  . Edg  12/17/2004  . Electrocardiogram  05/27/2007  . Skin cancer excision    . Cholecystectomy  04/25/2008  . C4 c5 cage insertion  06/28/2013  . L5 s1 rod insertion  11/07/2012   Past Medical History  Diagnosis Date  . ANXIETY 06/10/2007  . ASYMPTOMATIC POSTMENOPAUSAL STATUS 07/19/2009  . Cough 04/02/2009  . DYSLIPIDEMIA 06/28/2008  . GERD 06/10/2007  . 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 HYPERGLYCEMIA 06/28/2008  . HYPERTENSION 06/10/2007  . Irritable bowel syndrome 06/28/2008  . OSTEOARTHRITIS 06/10/2007  . UPPER RESPIRATORY INFECTION 03/26/2009  . WEIGHT GAIN 01/21/2010  . Degenerative arthritis of spine     back and neck   BP 139/76  Pulse 95  Resp 14  Wt 168 lb (76.204 kg)  SpO2 96%  Opioid Risk Score:   Fall Risk Score: Moderate Fall Risk (6-13 points) (previously educated and given handout)  Review of Systems  Constitutional: Positive for diaphoresis, appetite change and unexpected weight change.  Respiratory: Positive for cough and shortness of breath.   Musculoskeletal: Positive for gait problem.       Spasms  Neurological: Positive for dizziness, tremors, weakness and numbness.       Tingling  Psychiatric/Behavioral: Positive for confusion and dysphoric mood. The patient is nervous/anxious.   All other systems reviewed and are negative.      Objective:   Physical Exam  Nursing note and vitals reviewed. Constitutional: She is oriented to person, place, and time. She appears well-developed and well-nourished.  HENT:  Head: Normocephalic and atraumatic.    Neck: Normal range of motion. Neck supple.  Cardiovascular: Normal rate, regular rhythm and normal heart sounds.   Pulmonary/Chest: Effort normal and breath sounds normal.  Musculoskeletal:  Normal Muscle Bulk and Muscle Testing Reveals: Upper Extremities: Full ROM and Muscle Strength 5/5 Spine Of Scapula Tenderness on the Right Side Spinal Forward Flexion 30 Degrees and Extension 10 Degrees Lower Extremities: Full ROM and Muscle Strength 5/5 Arises from Chair with ease Narrow Based Gait  Neurological: She is alert and oriented to person, place, and time.  Skin: Skin is warm and dry.  Psychiatric: She has a normal mood and affect.          Assessment & Plan:  1. History of chronic lumbar facet disease with spondylosis/DDD/ L4-5 spondylolisthesis.With L4-5 L5-S1 decompression stabilization: Refilled: Hydrocodone 10/325mg  one tablet every 6 hours as needed #120.  2. Depression with anxiety: Continue Xanax, and Cymbalta. Following Dr. Doyne Keel and Dr. Valentina Shaggy  4. Cervical spondylosis: Stable at this time with no complaints. Continue Medication Regime.  5. Post Concussion Syndrome: Refilled Ritalin 10 mg one tablet three times a day. #90.  20 minutes of face to face patient care time was spent during this visit. All questions were encouraged and answered.   F/U in 1 month

## 2014-05-10 ENCOUNTER — Other Ambulatory Visit (HOSPITAL_COMMUNITY): Payer: Self-pay | Admitting: Psychiatry

## 2014-05-15 ENCOUNTER — Ambulatory Visit (INDEPENDENT_AMBULATORY_CARE_PROVIDER_SITE_OTHER): Payer: 59 | Admitting: Psychiatry

## 2014-05-15 ENCOUNTER — Encounter (HOSPITAL_COMMUNITY): Payer: Self-pay | Admitting: Psychiatry

## 2014-05-15 VITALS — BP 138/86 | HR 105 | Ht 61.0 in | Wt 170.2 lb

## 2014-05-15 DIAGNOSIS — F410 Panic disorder [episodic paroxysmal anxiety] without agoraphobia: Secondary | ICD-10-CM

## 2014-05-15 DIAGNOSIS — F411 Generalized anxiety disorder: Secondary | ICD-10-CM

## 2014-05-15 DIAGNOSIS — F3289 Other specified depressive episodes: Secondary | ICD-10-CM

## 2014-05-15 DIAGNOSIS — F4310 Post-traumatic stress disorder, unspecified: Secondary | ICD-10-CM

## 2014-05-15 DIAGNOSIS — F329 Major depressive disorder, single episode, unspecified: Secondary | ICD-10-CM

## 2014-05-15 MED ORDER — ALPRAZOLAM 0.5 MG PO TABS: 0.5000 mg | ORAL_TABLET | Freq: Three times a day (TID) | ORAL | Status: DC | PRN

## 2014-05-15 MED ORDER — LITHIUM CARBONATE 300 MG PO TABS: 300.0000 mg | ORAL_TABLET | Freq: Every day | ORAL | Status: DC

## 2014-05-15 MED ORDER — VENLAFAXINE HCL ER 150 MG PO CP24: 300.0000 mg | ORAL_CAPSULE | Freq: Every day | ORAL | Status: DC

## 2014-05-15 MED ORDER — AMITRIPTYLINE HCL 10 MG PO TABS: 10.0000 mg | ORAL_TABLET | Freq: Every day | ORAL | Status: DC

## 2014-05-15 NOTE — Progress Notes (Signed)
[facility] Progress Note  [name] [id number] 58 y.o.  05/15/2014 2:09 PM  Chief Complaint: "still sad"  History of Present Illness: Pt states depression continues unchanged. Husband tells her she is very irritable and mood changes from happy to angry quickly. Pt notes she gets "road rage" when driving and will yell, honk her horn and make inappropriate gestures at other drivers.   Sleep is poor and even with Amitripytline 20mg  and she is getting about 4 hrs/night. States she was told by her pain doctor to continue Elavil. Energy and appetite are poor. States she is not happy and reports anhedonia. She is making herself go to [place] every weekend but is not social with people. Memory and focus are bad.  Pt is having at least one panic attack a day. Anxiety is worse. Pt is scared all the time. Pt is taking Xanax TID and it helps to relax her.   Pt is taking Effexor, Xanax, Elavil and Ritalin as prescribed and endorsing SE of dry mouth. Ritalin does give her some energy but doesn't do anything else.   Suicidal Ideation: Yes passive thoughts at night Plan Formed: No Patient has means to carry out plan: No  Homicidal Ideation: No Plan Formed: No Patient has means to carry out plan: No  Review of Systems: Psychiatric: Agitation: Yes Hallucination: No Depressed Mood: Yes Insomnia: Yes Hypersomnia: No Altered Concentration: Yes Feels Worthless: Yes Grandiose Ideas: No Belief In Special Powers: No New/Increased Substance Abuse: No Compulsions: No  Neurologic: Headache: Yes Seizure: No Paresthesias: No  Past Medical, Family, Social History: Pt denies nicotine, alcohol and drug use. Reports a family psych hx of anxiety, depression and alcohol and drug abuse. Pt lives with her husband in [place]. She was raised in [place] by her parents with her 2 older sisters. Pt has been married 3 times and has been with her current husband for 13 yrs. She has one  step daughter. She is a Psychologist, forensic. Pt worked in the [employer] for 28 yrs and was fired in 2010 when the department closed. She is now on disability.   Outpatient Encounter Prescriptions as of 05/15/2014  Medication Sig  . acyclovir (ZOVIRAX) 800 MG tablet Take 1 tablet (800 mg total) by mouth daily.  Marland Kitchen ALPRAZolam (XANAX) 0.5 MG tablet Take 1 tablet (0.5 mg total) by mouth 3 (three) times daily as needed for anxiety.  Marland Kitchen amitriptyline (ELAVIL) 10 MG tablet 10 mg at bedtime.   Marland Kitchen atorvastatin (LIPITOR) 40 MG tablet Take 1 tablet (40 mg total) by mouth daily.  . fluocinonide cream (LIDEX) 8.14 % Apply 1 application topically 4 (four) times daily. As needed for rash  . HYDROcodone-acetaminophen (NORCO) 10-325 MG per tablet Take 1 tablet by mouth every 6 (six) hours as needed.  Marland Kitchen lisinopril-hydrochlorothiazide (PRINZIDE,ZESTORETIC) 10-12.5 MG per tablet Take 0.5 tablets by mouth daily.  . meclizine (ANTIVERT) 12.5 MG tablet TAKE 1 TABLET (12.5 MG TOTAL) BY MOUTH 3 (THREE) TIMES DAILY AS NEEDED.  . methocarbamol (ROBAXIN) 500 MG tablet Take 1 tablet (500 mg total) by mouth every 6 (six) hours as needed for muscle spasms.  . methylphenidate (RITALIN) 10 MG tablet Take 1 tablet (10 mg total) by mouth 3 (three) times daily. At 0700, 1200, AND 1700 DAILY  . omeprazole (PRILOSEC) 40 MG capsule Take 1 capsule (40 mg total) by mouth daily.  Marland Kitchen venlafaxine XR (EFFEXOR XR) 150 MG 24 hr capsule Take 2 capsules (300 mg total) by mouth daily.  Past Psychiatric History/Hospitalization(s): Anxiety: Yes Bipolar Disorder: No Depression: Yes Mania: No Psychosis: No Schizophrenia: No Personality Disorder: No Hospitalization for psychiatric illness: No History of Electroconvulsive Shock Therapy: No Prior Suicide Attempts: No  Physical Exam: Constitutional:  BP 138/86  Pulse 105  Ht 5\' 1"  (1.549 m)  Wt 170 lb 3.2 oz (77.202 kg)  BMI 32.18 kg/m2  General Appearance: alert, oriented, no acute  distress  Musculoskeletal: Strength & Muscle Tone: within normal limits Gait & Station: normal Patient leans: N/A  Mental Status Examination/Evaluation: Objective: Attitude: Calm and cooperative  Appearance: Casual, appears to be stated age  Eye Contact::  Good  Speech:  Clear and Coherent and Normal Rate  Volume:  Normal  Mood:  depressed  Affect:  Congruent  Thought Process:  Goal Directed, Linear and Logical  Orientation:  Full (Time, Place, and Person)  Thought Content:  WDL  Suicidal Thoughts:  Yes.  without intent/plan  Homicidal Thoughts:  No  Judgement:  Fair  Insight:  Fair  Concentration: good  Memory: Immediate- fair Recent- fair Remote-fair  Recall: fair  Language: fair  Gait and Station: normal  [employer] of Knowledge: average  Psychomotor Activity:  Normal  Akathisia:  No  Handed:  Right  AIMS (if indicated): n/a       Medical Decision Making (Choose Three): Review of Psycho-Social Stressors (1), Review or order clinical lab tests (1), Established Problem, Worsening (2), Review of Medication Regimen & Side Effects (2) and Review of New Medication or Change in Dosage (2)  Assessment: AXIS I  MDD; GAD; Panic disorder, PTSD   AXIS II  Deferred   AXIS III  Past Medical History    Diagnosis  Date    .  ANXIETY  06/10/2007    .  ASYMPTOMATIC POSTMENOPAUSAL STATUS  07/19/2009    .  Cough  04/02/2009    .  DYSLIPIDEMIA  06/28/2008    .  GERD  06/10/2007    .  HSV  06/28/2008    .  HYPERGLYCEMIA  06/28/2008    .  HYPERTENSION  06/10/2007    .  Irritable bowel syndrome  06/28/2008    .  OSTEOARTHRITIS  06/10/2007    .  UPPER RESPIRATORY INFECTION  03/26/2009    .  WEIGHT GAIN  01/21/2010    .  Degenerative arthritis of spine       back and neck   AXIS IV  other psychosocial or environmental problems and health stressors   AXIS V  51-60 moderate symptoms       Treatment Plan/Recommendations:  Plan of Care:  Medication management with supportive therapy.  Risks/benefits and SE of the medication discussed. Pt verbalized understanding and verbal consent obtained for treatment.  Affirm with the patient that the medications are taken as ordered. Patient expressed understanding of how their medications were to be used.  - worsening of symptoms    Laboratory: reviewed labs CBC shows signs of anemia Chemistry Profile - glucose elevated Folic Acid WNL GGT WNL HbAIC elevated UDS + benzos and opiates as prescribed UA WNL Tsh; T3/T4 WNL  Psychotherapy: Therapy: brief supportive therapy provided. Discussed psychosocial stressors in detail.   Medications: Continue Xanax 0.5mg  po TID prn with plan to eventually decrease and taper off.   Venlafaxine XR 300mg  po qD for mood and anxiety. Pt is aware dose is above FDA recommended dose and will call if SE develop. Pt has agreed to take dose as prescribed.   Continue Amitriptyline  10mg  po qHS for sleep. Pt is aware of possible drug interactions and is willing to continue the medication.    Start trial Lithium 300mg  po qHS for mood.   discontinue Ritalin as it is not effective and possibly increasing her BP  Routine PRN Medications: Yes   Consultations: encouraged to continue individual therapy with Dr. Valentina Shaggy when she can afford it  Safety Concerns: Pt denies SI and is at an acute low risk for suicide.Patient told to call clinic if any problems occur. Patient advised to go to ER if she should develop SI/HI, side effects, or if symptoms worsen. Has crisis numbers to call if needed. Pt verbalized understanding.   Other: F/up in 2 months or sooner if needed     Charlcie Cradle, MD 05/15/2014

## 2014-05-21 ENCOUNTER — Encounter: Payer: Self-pay | Admitting: Registered Nurse

## 2014-06-04 ENCOUNTER — Encounter: Payer: Self-pay | Admitting: Registered Nurse

## 2014-06-04 ENCOUNTER — Encounter: Payer: 59 | Attending: Physical Medicine and Rehabilitation | Admitting: Registered Nurse

## 2014-06-04 VITALS — BP 150/78 | HR 101 | Resp 14 | Wt 169.0 lb

## 2014-06-04 DIAGNOSIS — B9789 Other viral agents as the cause of diseases classified elsewhere: Secondary | ICD-10-CM | POA: Diagnosis not present

## 2014-06-04 DIAGNOSIS — M47816 Spondylosis without myelopathy or radiculopathy, lumbar region: Secondary | ICD-10-CM

## 2014-06-04 DIAGNOSIS — F329 Major depressive disorder, single episode, unspecified: Secondary | ICD-10-CM | POA: Diagnosis present

## 2014-06-04 DIAGNOSIS — F3289 Other specified depressive episodes: Secondary | ICD-10-CM | POA: Insufficient documentation

## 2014-06-04 DIAGNOSIS — M461 Sacroiliitis, not elsewhere classified: Secondary | ICD-10-CM

## 2014-06-04 DIAGNOSIS — F411 Generalized anxiety disorder: Secondary | ICD-10-CM | POA: Diagnosis present

## 2014-06-04 DIAGNOSIS — F4310 Post-traumatic stress disorder, unspecified: Secondary | ICD-10-CM

## 2014-06-04 DIAGNOSIS — B349 Viral infection, unspecified: Secondary | ICD-10-CM

## 2014-06-04 DIAGNOSIS — Z79899 Other long term (current) drug therapy: Secondary | ICD-10-CM

## 2014-06-04 DIAGNOSIS — Z5181 Encounter for therapeutic drug level monitoring: Secondary | ICD-10-CM

## 2014-06-04 DIAGNOSIS — X58XXXA Exposure to other specified factors, initial encounter: Secondary | ICD-10-CM | POA: Insufficient documentation

## 2014-06-04 DIAGNOSIS — M47817 Spondylosis without myelopathy or radiculopathy, lumbosacral region: Secondary | ICD-10-CM

## 2014-06-04 DIAGNOSIS — F332 Major depressive disorder, recurrent severe without psychotic features: Secondary | ICD-10-CM

## 2014-06-04 MED ORDER — HYDROCODONE-ACETAMINOPHEN 10-325 MG PO TABS: 1.0000 | ORAL_TABLET | Freq: Four times a day (QID) | ORAL | Status: DC | PRN

## 2014-06-04 NOTE — Progress Notes (Signed)
Subjective:    Patient ID: [name], female    DOB: April 21, 1956, 58 y.o.   MRN: [id number]  HPI: [name] is a 58 year old female who returns for follow up for chronic pain and medication refill. She says her pain is located between her shoulder blades, lower back radiating into her lower extremities. She rates her pain 8. Her current exercise regime is walking.  She says [name] put her on Lithium 300 mg daily, she had adverse effect with mood swings she has stopped taking the medication. She states she will let [name] know.  Pain Inventory Average Pain 8 Pain Right Now 8 My pain is sharp and burning  In the last 24 hours, has pain interfered with the following? General activity 10 Relation with others 10 Enjoyment of life 10 What TIME of day is your pain at its worst? varies Sleep (in general) Good  Pain is worse with: walking, sitting, standing, unsure and some activites Pain improves with: rest, heat/ice, medication and injections Relief from Meds: 6  Mobility walk without assistance use a cane how many minutes can you walk? 30 do you drive?  yes  Function disabled: date disabled 2015 I need assistance with the following:  meal prep, household duties and shopping  Neuro/Psych weakness tremor trouble walking spasms confusion depression anxiety loss of taste or smell  Prior Studies Any changes since last visit?  no  Physicians involved in your care Any changes since last visit?  no   Family History  Problem Relation Age of Onset  . Cancer Mother     cervical  . Cancer Father     colon  . Anxiety disorder Maternal Aunt   . Depression Maternal Aunt   . Anxiety disorder Maternal Uncle   . Depression Maternal Uncle     suicide attempt by gun shot wound to head. Survived  . Alcohol abuse Other   . Drug abuse Other    History   Social History  . Marital Status: Married    Spouse Name: N/A    Number of Children: N/A  . Years of  Education: N/A   Social History Main Topics  . Smoking status: Never Smoker   . Smokeless tobacco: Never Used  . Alcohol Use: No  . Drug Use: No  . Sexual Activity: Yes   Other Topics Concern  . None   Social History Narrative  . None   Past Surgical History  Procedure Laterality Date  . Abdominal hysterectomy  1999  . Tonsillectomy  1981  . Tubal ligation  1997  . Bunions removed  1988  . Edg  12/17/2004  . Electrocardiogram  05/27/2007  . Skin cancer excision    . Cholecystectomy  04/25/2008  . C4 c5 cage insertion  06/28/2013  . L5 s1 rod insertion  11/07/2012   Past Medical History  Diagnosis Date  . ANXIETY 06/10/2007  . ASYMPTOMATIC POSTMENOPAUSAL STATUS 07/19/2009  . Cough 04/02/2009  . DYSLIPIDEMIA 06/28/2008  . GERD 06/10/2007  . 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 HYPERGLYCEMIA 06/28/2008  . HYPERTENSION 06/10/2007  . Irritable bowel syndrome 06/28/2008  . OSTEOARTHRITIS 06/10/2007  . UPPER RESPIRATORY INFECTION 03/26/2009  . WEIGHT GAIN 01/21/2010  . Degenerative arthritis of spine     back and neck   BP 150/78  Pulse 101  Resp 14  Wt 169 lb (76.658 kg)  SpO2 98%  Opioid Risk Score:   Fall Risk Score: Moderate Fall Risk (6-13 points) (previously educated  and given handout) Review of Systems  Constitutional: Positive for chills, diaphoresis, appetite change and unexpected weight change.  Respiratory: Positive for cough.   Gastrointestinal: Positive for nausea, abdominal pain and diarrhea.  Musculoskeletal: Positive for gait problem.       Spasms  Neurological: Positive for dizziness, tremors and weakness.  Psychiatric/Behavioral: Positive for confusion and dysphoric mood. The patient is nervous/anxious.   All other systems reviewed and are negative.      Objective:   Physical Exam  Nursing note and vitals reviewed. Constitutional: She is oriented to person, place, and time. She appears well-developed and well-nourished.  HENT:  Head: Normocephalic and atraumatic.  Neck:  Normal range of motion. Neck supple.  Cardiovascular: Normal rate and regular rhythm.   Pulmonary/Chest: Effort normal and breath sounds normal.  Musculoskeletal:  Normal Muscle Bulk and Muscle testing Reveals: Upper Extremities: Full ROM and Muscle strength 5/5 Back without spinal or paraspinal tenderness Lower extremities: Full ROM and Muscle Strength 5/5 Arises from chair with ease Narrow Based Gait  Neurological: She is alert and oriented to person, place, and time.  Skin: Skin is warm and dry.  Psychiatric: She has a normal mood and affect.          Assessment & Plan:  1. History of chronic lumbar facet disease with spondylosis/DDD/ L4-5 spondylolisthesis.With L4-5 L5-S1 decompression stabilization: Refilled: Hydrocodone 10/325mg  one tablet every 6 hours as needed #120.  2. Depression with anxiety: Continue Xanax, and Effexor. Following [name] and [name]  4. Cervical spondylosis: Stable at this time with no complaints. Continue Medication Regime.  5. Post Concussion Syndrome: Continue Ritalin 10 mg one tablet three times a day. #90.   20 minutes of face to face patient care time was spent during this visit. All questions were encouraged and answered.  F/U in 1 month

## 2014-06-13 ENCOUNTER — Encounter: Payer: Self-pay | Admitting: Physical Medicine & Rehabilitation

## 2014-06-13 ENCOUNTER — Other Ambulatory Visit: Payer: Self-pay

## 2014-06-13 MED ORDER — METHYLPHENIDATE HCL 10 MG PO TABS: 10.0000 mg | ORAL_TABLET | Freq: Three times a day (TID) | ORAL | Status: DC

## 2014-06-24 ENCOUNTER — Other Ambulatory Visit: Payer: Self-pay | Admitting: Endocrinology

## 2014-07-03 ENCOUNTER — Encounter: Payer: Self-pay | Admitting: Registered Nurse

## 2014-07-03 ENCOUNTER — Encounter: Payer: 59 | Attending: Physical Medicine and Rehabilitation | Admitting: Registered Nurse

## 2014-07-03 VITALS — BP 130/78 | HR 89 | Resp 14

## 2014-07-03 DIAGNOSIS — Z5181 Encounter for therapeutic drug level monitoring: Secondary | ICD-10-CM

## 2014-07-03 DIAGNOSIS — M47817 Spondylosis without myelopathy or radiculopathy, lumbosacral region: Secondary | ICD-10-CM

## 2014-07-03 DIAGNOSIS — F329 Major depressive disorder, single episode, unspecified: Secondary | ICD-10-CM | POA: Diagnosis present

## 2014-07-03 DIAGNOSIS — B349 Viral infection, unspecified: Secondary | ICD-10-CM

## 2014-07-03 DIAGNOSIS — F3289 Other specified depressive episodes: Secondary | ICD-10-CM | POA: Diagnosis present

## 2014-07-03 DIAGNOSIS — Z79899 Other long term (current) drug therapy: Secondary | ICD-10-CM

## 2014-07-03 DIAGNOSIS — B9789 Other viral agents as the cause of diseases classified elsewhere: Secondary | ICD-10-CM | POA: Insufficient documentation

## 2014-07-03 DIAGNOSIS — F411 Generalized anxiety disorder: Secondary | ICD-10-CM | POA: Diagnosis present

## 2014-07-03 DIAGNOSIS — M461 Sacroiliitis, not elsewhere classified: Secondary | ICD-10-CM | POA: Insufficient documentation

## 2014-07-03 DIAGNOSIS — M47816 Spondylosis without myelopathy or radiculopathy, lumbar region: Secondary | ICD-10-CM

## 2014-07-03 MED ORDER — HYDROCODONE-ACETAMINOPHEN 10-325 MG PO TABS: 1.0000 | ORAL_TABLET | Freq: Four times a day (QID) | ORAL | Status: DC | PRN

## 2014-07-03 NOTE — Progress Notes (Signed)
Subjective:    Patient ID: [name], female    DOB: [birth year], 58 y.o.   MRN: [id number]  HPI: [name] is a 58 year old female who returns for follow up for chronic pain and medication refill. She says her pain is located in her lower back and occasionally in her left hip. She rates her pain 8. Her current exercise regime is walking and performing stretching exercises. She says Dr. Doyne Keel put her on Lithium 300 mg daily, she had adverse effect's with dizziness and profusely sweating and had episodes of double vision and blackouts. She stopped the lithium and has a follow up appointment with Dr. Doyne Keel. During her episode on 06/10/14, she didn't seek medical attention. She was instructed if this occurs again to seek medical attention and she verbalizes understanding.  Pain Inventory Average Pain 8 Pain Right Now 8 My pain is sharp, burning, dull, stabbing, tingling and aching  In the last 24 hours, has pain interfered with the following? General activity 8 Relation with others 9 Enjoyment of life 8 What TIME of day is your pain at its worst? varies Sleep (in general) Fair  Pain is worse with: walking, bending, sitting, standing and some activites Pain improves with: rest, heat/ice, pacing activities and medication Relief from Meds: went on vacation 06/18/14 w/o mucle spasms all week  Mobility use a cane how many minutes can you walk? 30 ability to climb steps?  yes do you drive?  yes  Function disabled: date disabled . I need assistance with the following:  dressing, meal prep, household duties and shopping  Neuro/Psych weakness numbness tremor tingling trouble walking spasms dizziness confusion depression anxiety loss of taste or smell  Prior Studies Any changes since last visit?  yes  Black out episode 06/10/14--did not call ems or go to Ed  Physicians involved in your care Any changes since last visit?  no   Family History  Problem Relation Age  of Onset  . Cancer Mother     cervical  . Cancer Father     colon  . Anxiety disorder Maternal Aunt   . Depression Maternal Aunt   . Anxiety disorder Maternal Uncle   . Depression Maternal Uncle     suicide attempt by gun shot wound to head. Survived  . Alcohol abuse Other   . Drug abuse Other    History   Social History  . Marital Status: Married    Spouse Name: N/A    Number of Children: N/A  . Years of Education: N/A   Social History Main Topics  . Smoking status: Never Smoker   . Smokeless tobacco: Never Used  . Alcohol Use: No  . Drug Use: No  . Sexual Activity: Yes   Other Topics Concern  . None   Social History Narrative  . None   Past Surgical History  Procedure Laterality Date  . Abdominal hysterectomy  1999  . Tonsillectomy  1981  . Tubal ligation  1997  . Bunions removed  1988  . Edg  12/17/2004  . Electrocardiogram  05/27/2007  . Skin cancer excision    . Cholecystectomy  04/25/2008  . C4 c5 cage insertion  06/28/2013  . L5 s1 rod insertion  11/07/2012   Past Medical History  Diagnosis Date  . ANXIETY 06/10/2007  . ASYMPTOMATIC POSTMENOPAUSAL STATUS 07/19/2009  . Cough 04/02/2009  . DYSLIPIDEMIA 06/28/2008  . GERD 06/10/2007  . 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 HYPERGLYCEMIA 06/28/2008  . HYPERTENSION 06/10/2007  .  Irritable bowel syndrome 06/28/2008  . OSTEOARTHRITIS 06/10/2007  . UPPER RESPIRATORY INFECTION 03/26/2009  . WEIGHT GAIN 01/21/2010  . Degenerative arthritis of spine     back and neck   BP 130/78  Pulse 89  Resp 14  SpO2 99%  Opioid Risk Score:   Fall Risk Score: Moderate Fall Risk (6-13 points) (previously educated and given handout) Review of Systems  Constitutional: Positive for chills and diaphoresis.       Loss of taste or smell  Respiratory: Positive for cough and shortness of breath.   Gastrointestinal: Positive for nausea, vomiting and diarrhea.  Genitourinary: Positive for dysuria.  Musculoskeletal: Positive for gait problem.       Spasms    Neurological: Positive for dizziness, tremors, weakness and numbness.       Tingling  Psychiatric/Behavioral: Positive for confusion and dysphoric mood. The patient is nervous/anxious.   All other systems reviewed and are negative.      Objective:   Physical Exam  Nursing note and vitals reviewed. Constitutional: She is oriented to person, place, and time. She appears well-developed and well-nourished.  HENT:  Head: Normocephalic and atraumatic.  Neck: Normal range of motion. Neck supple.  Cardiovascular: Normal rate and regular rhythm.   Pulmonary/Chest: Effort normal and breath sounds normal.  Musculoskeletal:  Normal Muscle Bulk and Muscle Testing Reveals:  Upper Extremities: Full ROM and Muscle Strength 5/5 Lumbar Paraspinal tenderness: L-3- L-5 Lower Extremities: Full ROM and Muscle Strength 5/5 Arises from chair with ease/ Uses a straight Cane Narrow Based gait    Neurological: She is alert and oriented to person, place, and time.  Skin: Skin is warm and dry.  Psychiatric: She has a normal mood and affect.          Assessment & Plan:  1. History of chronic lumbar facet disease with spondylosis/DDD/ L4-5 spondylolisthesis.With L4-5 L5-S1 decompression stabilization: Refilled: Hydrocodone 10/325mg  one tablet every 6 hours as needed #120.  2. Depression with anxiety: Continue Xanax, and Effexor. Following Dr. Doyne Keel and Dr. Valentina Shaggy  4. Cervical spondylosis: Stable at this time with no complaints. Continue Medication Regime.  5. Post Concussion Syndrome: Continue Ritalin 10 mg one tablet three times a day. #90.   20 minutes of face to face patient care time was spent during this visit. All questions were encouraged and answered.   F/U in 1 month

## 2014-07-10 ENCOUNTER — Other Ambulatory Visit (HOSPITAL_COMMUNITY): Payer: Self-pay | Admitting: Psychiatry

## 2014-07-17 ENCOUNTER — Encounter (HOSPITAL_COMMUNITY): Payer: Self-pay | Admitting: Psychiatry

## 2014-07-17 ENCOUNTER — Ambulatory Visit (INDEPENDENT_AMBULATORY_CARE_PROVIDER_SITE_OTHER): Payer: 59 | Admitting: Psychiatry

## 2014-07-17 VITALS — BP 134/76 | HR 96 | Ht 61.0 in | Wt 166.6 lb

## 2014-07-17 DIAGNOSIS — F411 Generalized anxiety disorder: Secondary | ICD-10-CM

## 2014-07-17 DIAGNOSIS — F410 Panic disorder [episodic paroxysmal anxiety] without agoraphobia: Secondary | ICD-10-CM

## 2014-07-17 DIAGNOSIS — F4310 Post-traumatic stress disorder, unspecified: Secondary | ICD-10-CM

## 2014-07-17 DIAGNOSIS — F331 Major depressive disorder, recurrent, moderate: Secondary | ICD-10-CM

## 2014-07-17 MED ORDER — VENLAFAXINE HCL ER 150 MG PO CP24: 300.0000 mg | ORAL_CAPSULE | Freq: Every day | ORAL | Status: DC

## 2014-07-17 MED ORDER — ALPRAZOLAM 0.5 MG PO TABS: 0.5000 mg | ORAL_TABLET | Freq: Three times a day (TID) | ORAL | Status: DC | PRN

## 2014-07-17 NOTE — Progress Notes (Signed)
Patient ID: [name], female   DOB: 09-28-1956, 58 y.o.   MRN: [id number]  [facility] Progress Note  Maille Halliwell [id number] 58 y.o.  07/17/2014 1:58 PM  Chief Complaint: "still sad"  History of Present Illness: Pt states depression is improving and thinks it is due to Venlafaxine. She is no longer depressed on a daily basis but mostly due to triggers.  No longer taking Amitriptyline and Lithium since August. States she is crying less frequently. Ritalin is helping with energy and motivation. She only takes it on days she has low energy. Pt is starting a bunch of projects but is not completing any. Memory remains poor and she states she has to write everything down. Denies anhedonia and isolation. Energy and appetite are low.   Pt is having panic attack 3x/week. Anxiety is "not good" and a lot is due to her husband. Pt is scared all the time. Pt is taking Xanax TID and it helps to relax her.   Pt is taking Effexor, Xanax and Ritalin as prescribed and endorsing SE of dry mouth.  Suicidal Ideation: No Plan Formed: No Patient has means to carry out plan: No  Homicidal Ideation: No Plan Formed: No Patient has means to carry out plan: No  Review of Systems: Psychiatric: Agitation: Yes at husband Hallucination: No Depressed Mood: Yes mild Insomnia: Yes 4-5 hrs/night Hypersomnia: No Altered Concentration: Yes Feels Worthless: Yes on/off Grandiose Ideas: No Belief In Special Powers: No New/Increased Substance Abuse: No Compulsions: No  Review of Systems  Neurological: Positive for headaches.     Neurologic: Headache: Yes Seizure: No Paresthesias: No  Past Medical, Family, Social History: Pt denies nicotine, alcohol and drug use. Reports a family psych hx of anxiety, depression and alcohol and drug abuse. Pt lives with her husband in [place]. She was raised in [place] by her parents with her 2 older sisters. Pt has been married 3 times and has been  with her current husband for 13 yrs. She has one step daughter. She is a Psychologist, forensic. Pt worked in the [employer] for 28 yrs and was fired in 2010 when the department closed. She is now on disability.   Family History  Problem Relation Age of Onset  . Cancer Mother     cervical  . Cancer Father     colon  . Anxiety disorder Maternal Aunt   . Depression Maternal Aunt   . Anxiety disorder Maternal Uncle   . Depression Maternal Uncle     suicide attempt by gun shot wound to head. Survived  . Alcohol abuse Other   . Drug abuse Other    Past Medical History  Diagnosis Date  . ANXIETY 06/10/2007  . ASYMPTOMATIC POSTMENOPAUSAL STATUS 07/19/2009  . Cough 04/02/2009  . DYSLIPIDEMIA 06/28/2008  . GERD 06/10/2007  . 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 HYPERGLYCEMIA 06/28/2008  . HYPERTENSION 06/10/2007  . Irritable bowel syndrome 06/28/2008  . OSTEOARTHRITIS 06/10/2007  . UPPER RESPIRATORY INFECTION 03/26/2009  . WEIGHT GAIN 01/21/2010  . Degenerative arthritis of spine     back and neck    Outpatient Encounter Prescriptions as of 07/17/2014  Medication Sig  . acyclovir (ZOVIRAX) 800 MG tablet Take 1 tablet (800 mg total) by mouth daily.  Marland Kitchen ALPRAZolam (XANAX) 0.5 MG tablet Take 1 tablet (0.5 mg total) by mouth 3 (three) times daily as needed for anxiety.  Marland Kitchen atorvastatin (LIPITOR) 40 MG tablet TAKE 1 TABLET DAILY  . lisinopril-hydrochlorothiazide (PRINZIDE,ZESTORETIC) 10-12.5 MG per tablet Take 0.5  tablets by mouth daily.  . meclizine (ANTIVERT) 12.5 MG tablet TAKE 1 TABLET (12.5 MG TOTAL) BY MOUTH 3 (THREE) TIMES DAILY AS NEEDED.  Marland Kitchen methylphenidate (RITALIN) 10 MG tablet Take 1 tablet (10 mg total) by mouth 3 (three) times daily with meals. 0700,1200,1700  . omeprazole (PRILOSEC) 40 MG capsule Take 1 capsule (40 mg total) by mouth daily.  Marland Kitchen venlafaxine XR (EFFEXOR-XR) 150 MG 24 hr capsule Take 2 capsules (300 mg total) by mouth daily.  Marland Kitchen amitriptyline (ELAVIL) 10 MG tablet Take 1 tablet (10 mg total) by mouth at bedtime.  Marland Kitchen  HYDROcodone-acetaminophen (NORCO) 10-325 MG per tablet Take 1 tablet by mouth every 6 (six) hours as needed.  . lithium 300 MG tablet Take 1 tablet (300 mg total) by mouth daily.    Past Psychiatric History/Hospitalization(s): Anxiety: Yes Bipolar Disorder: No Depression: Yes Mania: No Psychosis: No Schizophrenia: No Personality Disorder: No Hospitalization for psychiatric illness: No History of Electroconvulsive Shock Therapy: No Prior Suicide Attempts: No  Physical Exam: Constitutional:  BP 134/76  Pulse 96  Ht 5\' 1"  (1.549 m)  Wt 166 lb 9.6 oz (75.569 kg)  BMI 31.49 kg/m2  General Appearance: alert, oriented, no acute distress  Musculoskeletal: Strength & Muscle Tone: within normal limits Gait & Station: normal Patient leans: N/A  Mental Status Examination/Evaluation: Objective: Attitude: Calm and cooperative  Appearance: Casual, appears to be stated age  Eye Contact::  Good  Speech:  Clear and Coherent and Normal Rate  Volume:  Normal  Mood:  depressed  Affect:  Congruent- brighter at today's appt than at previous appts  Thought Process:  Circumstantial  Orientation:  Full (Time, Place, and Person)  Thought Content:  WDL  Suicidal Thoughts:  Yes.  without intent/plan  Homicidal Thoughts:  No  Judgement:  Fair  Insight:  Fair  Concentration: fair at times she is tangential but redirectable  Memory: Immediate- fair Recent- fair Remote-fair  Recall: fair  Language: fair  Gait and Station: normal  [employer] of Knowledge: average  Psychomotor Activity:  Normal  Akathisia:  No  Handed:  Right  AIMS (if indicated): n/a       Medical Decision Making (Choose Three): Established Problem, Stable/Improving (1), Review of Psycho-Social Stressors (1), Review of Last Therapy Session (1) and Review of Medication Regimen & Side Effects (2)  Assessment: AXIS I  MDD- recurrent, moderate; GAD; Panic disorder without agorophobia, PTSD   AXIS II  Deferred   AXIS  III  Past Medical History    Diagnosis  Date    .  ANXIETY  06/10/2007    .  ASYMPTOMATIC POSTMENOPAUSAL STATUS  07/19/2009    .  Cough  04/02/2009    .  DYSLIPIDEMIA  06/28/2008    .  GERD  06/10/2007    .  HSV  06/28/2008    .  HYPERGLYCEMIA  06/28/2008    .  HYPERTENSION  06/10/2007    .  Irritable bowel syndrome  06/28/2008    .  OSTEOARTHRITIS  06/10/2007    .  UPPER RESPIRATORY INFECTION  03/26/2009    .  WEIGHT GAIN  01/21/2010    .  Degenerative arthritis of spine       back and neck   AXIS IV  other psychosocial or environmental problems and health stressors   AXIS V  51-60 moderate symptoms       Treatment Plan/Recommendations:  Plan of Care:  Medication management with supportive therapy. Risks/benefits and SE of the  medication discussed. Pt verbalized understanding and verbal consent obtained for treatment.  Affirm with the patient that the medications are taken as ordered. Patient expressed understanding of how their medications were to be used.  - overall improvement of depression and panic symptoms but anxiety remains high     Laboratory: none today  Psychotherapy: Therapy: brief supportive therapy provided. Discussed psychosocial stressors in detail.   Medications: Continue Xanax 0.5mg  po TID prn with plan to eventually decrease and taper off.   Venlafaxine XR 300mg  po qD for mood and anxiety. Pt is aware dose is above FDA recommended dose and will call if SE develop. Pt has agreed to take dose as prescribed.   D/C Amitriptyline  D/C Lithium    continue Ritalin 10mg  po TID as adjunct for depresion  Routine PRN Medications: Yes   Consultations: encouraged to continue individual therapy with Dr. Valentina Shaggy when she can afford it  Safety Concerns: Pt denies SI and is at an acute low risk for suicide.Patient told to call clinic if any problems occur. Patient advised to go to ER if she should develop SI/HI, side effects, or if symptoms worsen. Has crisis numbers to call if needed. Pt  verbalized understanding.   Other: F/up in 3 months or sooner if needed     Charlcie Cradle, MD 07/17/2014

## 2014-07-18 ENCOUNTER — Telehealth (HOSPITAL_COMMUNITY): Payer: Self-pay

## 2014-07-18 DIAGNOSIS — F410 Panic disorder [episodic paroxysmal anxiety] without agoraphobia: Secondary | ICD-10-CM | POA: Insufficient documentation

## 2014-07-18 NOTE — Telephone Encounter (Signed)
Pt is not prescribed Trazodone. We reviewed her med list at her appt on 10/6 and she was provided with an updated list at the end of the appt.

## 2014-07-18 NOTE — Telephone Encounter (Signed)
Pt is no longer taking Amitriptyline. Refill request denied.

## 2014-07-19 ENCOUNTER — Encounter (HOSPITAL_COMMUNITY): Payer: Self-pay | Admitting: Psychiatry

## 2014-07-19 ENCOUNTER — Telehealth (HOSPITAL_COMMUNITY): Payer: Self-pay

## 2014-07-19 ENCOUNTER — Other Ambulatory Visit (HOSPITAL_COMMUNITY): Payer: Self-pay | Admitting: Psychiatry

## 2014-07-21 ENCOUNTER — Other Ambulatory Visit: Payer: Self-pay | Admitting: Endocrinology

## 2014-07-30 ENCOUNTER — Ambulatory Visit (INDEPENDENT_AMBULATORY_CARE_PROVIDER_SITE_OTHER): Payer: 59 | Admitting: Endocrinology

## 2014-07-30 ENCOUNTER — Encounter: Payer: Self-pay | Admitting: Endocrinology

## 2014-07-30 VITALS — BP 136/88 | HR 108 | Temp 98.1°F | Ht 61.0 in | Wt 163.0 lb

## 2014-07-30 DIAGNOSIS — Z0000 Encounter for general adult medical examination without abnormal findings: Secondary | ICD-10-CM

## 2014-07-30 DIAGNOSIS — I10 Essential (primary) hypertension: Secondary | ICD-10-CM

## 2014-07-30 DIAGNOSIS — Z23 Encounter for immunization: Secondary | ICD-10-CM

## 2014-07-30 DIAGNOSIS — R739 Hyperglycemia, unspecified: Secondary | ICD-10-CM

## 2014-07-30 DIAGNOSIS — Z0189 Encounter for other specified special examinations: Secondary | ICD-10-CM

## 2014-07-30 DIAGNOSIS — R0600 Dyspnea, unspecified: Secondary | ICD-10-CM

## 2014-07-30 DIAGNOSIS — N951 Menopausal and female climacteric states: Secondary | ICD-10-CM

## 2014-07-30 LAB — MICROALBUMIN / CREATININE URINE RATIO
Creatinine,U: 209 mg/dL
Microalb Creat Ratio: 0.5 mg/g (ref 0.0–30.0)
Microalb, Ur: 1 mg/dL (ref 0.0–1.9)

## 2014-07-30 LAB — URINALYSIS, ROUTINE W REFLEX MICROSCOPIC
BILIRUBIN URINE: NEGATIVE
Ketones, ur: NEGATIVE
LEUKOCYTES UA: NEGATIVE
NITRITE: NEGATIVE
Specific Gravity, Urine: 1.025 (ref 1.000–1.030)
Total Protein, Urine: NEGATIVE
Urine Glucose: NEGATIVE
Urobilinogen, UA: 0.2 (ref 0.0–1.0)
pH: 6.5 (ref 5.0–8.0)

## 2014-07-30 MED ORDER — LOSARTAN POTASSIUM-HCTZ 100-12.5 MG PO TABS: 1.0000 | ORAL_TABLET | Freq: Every day | ORAL | Status: DC

## 2014-07-30 NOTE — Patient Instructions (Addendum)
Let's check a treadmill test.  you will receive a phone call, about a day and time for an appointment. Please change the lisinopril to another pill.  i have sent a prescription to your pharmacy. please consider these measures for your health:  minimize alcohol.  do not use tobacco products.  have a colonoscopy at least every 10 years from age 57.  Women should have an annual mammogram from age 16.  keep firearms safely stored.  always use seat belts.  have working smoke alarms in your home.  see an eye doctor and dentist regularly.  never drive under the influence of alcohol or drugs (including prescription drugs).  those with fair skin should take precautions against the sun. Your heart rate is a little fast.  I don't know if this is the Ritalin.  Please discuss with Dr Delfino Lovett tomorrow.   blood tests are being requested for you today.  We'll contact you with results. Please return in 1 year.

## 2014-07-30 NOTE — Progress Notes (Signed)
Subjective:    Patient ID: [name], female    DOB: January 14, 1956, 58 y.o.   MRN: [id number]  HPI Pt is here for regular wellness examination, and is feeling pretty well in general, and says chronic med probs are stable, except as noted below Past Medical History  Diagnosis Date  . ANXIETY 06/10/2007  . ASYMPTOMATIC POSTMENOPAUSAL STATUS 07/19/2009  . Cough 04/02/2009  . DYSLIPIDEMIA 06/28/2008  . GERD 06/10/2007  . 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 HYPERGLYCEMIA 06/28/2008  . HYPERTENSION 06/10/2007  . Irritable bowel syndrome 06/28/2008  . OSTEOARTHRITIS 06/10/2007  . UPPER RESPIRATORY INFECTION 03/26/2009  . WEIGHT GAIN 01/21/2010  . Degenerative arthritis of spine     back and neck    Past Surgical History  Procedure Laterality Date  . Abdominal hysterectomy  1999  . Tonsillectomy  1981  . Tubal ligation  1997  . Bunions removed  1988  . Edg  12/17/2004  . Electrocardiogram  05/27/2007  . Skin cancer excision    . Cholecystectomy  04/25/2008  . C4 c5 cage insertion  06/28/2013  . L5 s1 rod insertion  11/07/2012    History   Social History  . Marital Status: Married    Spouse Name: N/A    Number of Children: N/A  . Years of Education: N/A   Occupational History  . Not on file.   Social History Main Topics  . Smoking status: Never Smoker   . Smokeless tobacco: Never Used  . Alcohol Use: No  . Drug Use: No  . Sexual Activity: Yes   Other Topics Concern  . Not on file   Social History Narrative  . No narrative on file    Current Outpatient Prescriptions on File Prior to Visit  Medication Sig Dispense Refill  . acyclovir (ZOVIRAX) 800 MG tablet Take 1 tablet (800 mg total) by mouth daily.  90 tablet  1  . ALPRAZolam (XANAX) 0.5 MG tablet Take 1 tablet (0.5 mg total) by mouth 3 (three) times daily as needed for anxiety.  90 tablet  2  . atorvastatin (LIPITOR) 40 MG tablet TAKE 1 TABLET DAILY  90 tablet  0  . meclizine (ANTIVERT) 12.5 MG tablet TAKE 1 TABLET (12.5 MG TOTAL) BY  MOUTH 3 (THREE) TIMES DAILY AS NEEDED.  45 tablet  2  . methylphenidate (RITALIN) 10 MG tablet Take 1 tablet (10 mg total) by mouth 3 (three) times daily with meals. 0700,1200,1700  90 tablet  0  . omeprazole (PRILOSEC) 40 MG capsule TAKE 1 CAPSULE DAILY  90 capsule  1  . venlafaxine XR (EFFEXOR-XR) 150 MG 24 hr capsule Take 2 capsules (300 mg total) by mouth daily.  60 capsule  2   No current facility-administered medications on file prior to visit.    Allergies  Allergen Reactions  . Poison Ivy Extract [employer] Poison Ivy]   . Poison Oak Extract [employer] Poison Oak]   . Poison Sumac Extract     Family History  Problem Relation Age of Onset  . Cancer Mother     cervical  . Cancer Father     colon  . Anxiety disorder Maternal Aunt   . Depression Maternal Aunt   . Anxiety disorder Maternal Uncle   . Depression Maternal Uncle     suicide attempt by gun shot wound to head. Survived  . Alcohol abuse Other   . Drug abuse Other     BP 136/88  Pulse 108  Temp(Src) 98.1 F (36.7  C) (Oral)  Ht 5\' 1"  (1.549 m)  Wt 163 lb (73.936 kg)  BMI 30.81 kg/m2  SpO2 98%     Review of Systems  Constitutional: Negative for fever.       She has lost a few lbs  HENT: Negative for hearing loss.   Eyes: Negative for visual disturbance.  Respiratory: Negative for wheezing.   Cardiovascular: Negative for leg swelling.  Gastrointestinal: Negative for anal bleeding.  Endocrine: Negative for cold intolerance.  Genitourinary: Negative for hematuria.  Musculoskeletal: Negative for neck pain.  Skin: Negative for wound.  Allergic/Immunologic: Negative for environmental allergies.  Neurological: Negative for syncope.  Hematological: Bruises/bleeds easily.  Psychiatric/Behavioral: Negative for suicidal ideas.       Objective:   Physical Exam VS: see vs page GEN: no distress HEAD: head: no deformity eyes: no periorbital swelling, no proptosis external nose and ears are  normal mouth: no lesion seen NECK: a healed scar is present (C-spine surgery).  i do not appreciate a nodule in the thyroid or elsewhere in the neck CHEST WALL: no deformity BREASTS: sees gyn CV: tachycardic rate, but normal rhythm, no murmur ABD: abdomen is soft, nontender.  no hepatosplenomegaly.  not distended.  no hernia GENITALIA/RECTAL: sees gyn MUSCULOSKELETAL: muscle bulk and strength are grossly normal.  no obvious joint swelling.  gait is normal and steady.  Old healed surgical scar at the midline sacral area EXTEMITIES: no deformity.  no ulcer on the feet.  feet are of normal color and temp.  no edema PULSES: dorsalis pedis intact bilat.  no carotid bruit NEURO:  cn 2-12 grossly intact.   readily moves all 4's.  sensation is intact to touch on the feet SKIN:  Normal texture and temperature.  No rash or suspicious lesion is visible.   NODES:  None palpable at the neck PSYCH: alert, well-oriented.  Does not appear anxious nor depressed.       Assessment & Plan:  Wellness visit today, with problems stable, except as noted. please consider these measures for your health:  minimize alcohol.  do not use tobacco products.  have a colonoscopy at least every 10 years from age 54.  Women should have an annual mammogram from age 9.  keep firearms safely stored.  always use seat belts.  have working smoke alarms in your home.  see an eye doctor and dentist regularly.  never drive under the influence of alcohol or drugs (including prescription drugs).  those with fair skin should take precautions against the sun.   SEPARATE EVALUATION FOLLOWS--EACH PROBLEM HERE IS NEW, NOT RESPONDING TO TREATMENT, OR POSES SIGNIFICANT RISK TO THE PATIENT'S HEALTH: HISTORY OF THE PRESENT ILLNESS: Pt states few mos of intermittent slight sob sensation in the chest, in the context of exertion.  No assoc LOC PAST MEDICAL HISTORY reviewed and up to date today.   REVIEW OF SYSTEMS:  She has slight chest pain,  not with the sob, but for 1-2 minutes when she is resting at night.   She has a ticked in her throat PHYSICAL EXAMINATION: VITAL SIGNS:  See vs page GENERAL: no distress LUNGS:  Clear to auscultation LAB/XRAY RESULTS: i reviewed electrocardiogram Lab Results  Component Value Date   WBC 5.0 07/30/2014   HGB 11.9* 07/30/2014   HCT 36.7 07/30/2014   PLT 208.0 07/30/2014   GLUCOSE 98 07/30/2014   CHOL 180 07/30/2014   TRIG 249.0* 07/30/2014   HDL 39.40 07/30/2014   LDLDIRECT 95.5 07/30/2014   LDLCALC 96 08/15/2009  ALT 17 07/30/2014   AST 26 07/30/2014   NA 138 07/30/2014   K 4.1 07/30/2014   CL 98 07/30/2014   CREATININE 0.8 07/30/2014   BUN 13 07/30/2014   CO2 28 07/30/2014   TSH 3.757 03/22/2014   INR 0.95 10/28/2012   HGBA1C 6.0 07/30/2014   MICROALBUR 1.0 07/30/2014  IMPRESSION: Doe, new, uncertain etiology Tachycardia, uncertain etiology, possibly due to anxiety PLAN:  Let's check a treadmill test. Your heart rate is a little fast.  I don't know if this is the Ritalin.  Please discuss with Dr Delfino Lovett tomorrow.

## 2014-07-31 ENCOUNTER — Encounter: Payer: Self-pay | Admitting: Registered Nurse

## 2014-07-31 ENCOUNTER — Encounter: Payer: 59 | Attending: Physical Medicine and Rehabilitation | Admitting: Registered Nurse

## 2014-07-31 VITALS — BP 150/96 | HR 93 | Resp 14 | Wt 164.8 lb

## 2014-07-31 DIAGNOSIS — F4310 Post-traumatic stress disorder, unspecified: Secondary | ICD-10-CM

## 2014-07-31 DIAGNOSIS — F332 Major depressive disorder, recurrent severe without psychotic features: Secondary | ICD-10-CM

## 2014-07-31 DIAGNOSIS — M47816 Spondylosis without myelopathy or radiculopathy, lumbar region: Secondary | ICD-10-CM

## 2014-07-31 DIAGNOSIS — Z5181 Encounter for therapeutic drug level monitoring: Secondary | ICD-10-CM

## 2014-07-31 DIAGNOSIS — F411 Generalized anxiety disorder: Secondary | ICD-10-CM | POA: Insufficient documentation

## 2014-07-31 DIAGNOSIS — M461 Sacroiliitis, not elsewhere classified: Secondary | ICD-10-CM | POA: Insufficient documentation

## 2014-07-31 DIAGNOSIS — Z79899 Other long term (current) drug therapy: Secondary | ICD-10-CM

## 2014-07-31 DIAGNOSIS — M47817 Spondylosis without myelopathy or radiculopathy, lumbosacral region: Secondary | ICD-10-CM | POA: Insufficient documentation

## 2014-07-31 LAB — CBC WITH DIFFERENTIAL/PLATELET
Basophils Absolute: 0 10*3/uL (ref 0.0–0.1)
Basophils Relative: 0.5 % (ref 0.0–3.0)
Eosinophils Absolute: 0 10*3/uL (ref 0.0–0.7)
Eosinophils Relative: 0.6 % (ref 0.0–5.0)
HCT: 36.7 % (ref 36.0–46.0)
Hemoglobin: 11.9 g/dL — ABNORMAL LOW (ref 12.0–15.0)
Lymphocytes Relative: 31.9 % (ref 12.0–46.0)
Lymphs Abs: 1.6 10*3/uL (ref 0.7–4.0)
MCHC: 32.5 g/dL (ref 30.0–36.0)
MCV: 77.9 fl — ABNORMAL LOW (ref 78.0–100.0)
Monocytes Absolute: 0.3 10*3/uL (ref 0.1–1.0)
Monocytes Relative: 5.5 % (ref 3.0–12.0)
Neutro Abs: 3.1 10*3/uL (ref 1.4–7.7)
Neutrophils Relative %: 61.5 % (ref 43.0–77.0)
Platelets: 208 10*3/uL (ref 150.0–400.0)
RBC: 4.72 Mil/uL (ref 3.87–5.11)
RDW: 15.8 % — ABNORMAL HIGH (ref 11.5–15.5)
WBC: 5 10*3/uL (ref 4.0–10.5)

## 2014-07-31 LAB — HEPATIC FUNCTION PANEL
ALBUMIN: 3.8 g/dL (ref 3.5–5.2)
ALK PHOS: 79 U/L (ref 39–117)
ALT: 17 U/L (ref 0–35)
AST: 26 U/L (ref 0–37)
Bilirubin, Direct: 0.1 mg/dL (ref 0.0–0.3)
TOTAL PROTEIN: 7.2 g/dL (ref 6.0–8.3)
Total Bilirubin: 0.5 mg/dL (ref 0.2–1.2)

## 2014-07-31 LAB — BASIC METABOLIC PANEL
BUN: 13 mg/dL (ref 6–23)
CO2: 28 meq/L (ref 19–32)
Calcium: 9.4 mg/dL (ref 8.4–10.5)
Chloride: 98 mEq/L (ref 96–112)
Creatinine, Ser: 0.8 mg/dL (ref 0.4–1.2)
GFR: 77.39 mL/min (ref >60.00)
GLUCOSE: 98 mg/dL (ref 70–99)
POTASSIUM: 4.1 meq/L (ref 3.5–5.1)
Sodium: 138 mEq/L (ref 135–145)

## 2014-07-31 LAB — LDL CHOLESTEROL, DIRECT: Direct LDL: 95.5 mg/dL

## 2014-07-31 LAB — HEMOGLOBIN A1C: Hgb A1c MFr Bld: 6 % (ref 4.6–6.5)

## 2014-07-31 LAB — LIPID PANEL
Cholesterol: 180 mg/dL (ref 0–200)
HDL: 39.4 mg/dL
NonHDL: 140.6
Total CHOL/HDL Ratio: 5
Triglycerides: 249 mg/dL — ABNORMAL HIGH (ref 0.0–149.0)
VLDL: 49.8 mg/dL — ABNORMAL HIGH (ref 0.0–40.0)

## 2014-07-31 LAB — IBC PANEL
Iron: 39 ug/dL — ABNORMAL LOW (ref 42–145)
Saturation Ratios: 10.3 % — ABNORMAL LOW (ref 20.0–50.0)
Transferrin: 270.5 mg/dL (ref 212.0–360.0)

## 2014-07-31 MED ORDER — HYDROCODONE-ACETAMINOPHEN 10-325 MG PO TABS: 1.0000 | ORAL_TABLET | Freq: Four times a day (QID) | ORAL | Status: DC | PRN

## 2014-07-31 NOTE — Progress Notes (Signed)
Subjective:    Patient ID: [name], female    DOB: [birth year], 58 y.o.   MRN: [id number]  HPI: [name] is a 58 year old female who returns for follow up for chronic pain and medication refill. She says her pain is located in her left buttock. She rates her pain 8. She has not followed her usual exercise regime due to episodes of dizziness and blackouts. Her PCP has scheduled a stress test.   Pain Inventory Average Pain 8 Pain Right Now 8 My pain is constant, sharp, burning, dull, stabbing, tingling and aching  In the last 24 hours, has pain interfered with the following? General activity 10 Relation with others 10 Enjoyment of life 10 What TIME of day is your pain at its worst? daytime, evening and night Sleep (in general) Poor  Pain is worse with: walking, sitting, inactivity, standing and some activites Pain improves with: rest, heat/ice, therapy/exercise, pacing activities and medication Relief from Meds: 6  Mobility walk with assistance how many minutes can you walk? 30 minutes ability to climb steps?  no do you drive?  yes  Function disabled: date disabled .  Neuro/Psych bladder control problems weakness numbness tremor tingling trouble walking spasms dizziness confusion depression anxiety loss of taste or smell  Prior Studies Any changes since last visit?  no  Physicians involved in your care Any changes since last visit?  no   Family History  Problem Relation Age of Onset  . Cancer Mother     cervical  . Cancer Father     colon  . Anxiety disorder Maternal Aunt   . Depression Maternal Aunt   . Anxiety disorder Maternal Uncle   . Depression Maternal Uncle     suicide attempt by gun shot wound to head. Survived  . Alcohol abuse Other   . Drug abuse Other    History   Social History  . Marital Status: Married    Spouse Name: N/A    Number of Children: N/A  . Years of Education: N/A   Social History Main Topics  .  Smoking status: Never Smoker   . Smokeless tobacco: Never Used  . Alcohol Use: No  . Drug Use: No  . Sexual Activity: Yes   Other Topics Concern  . None   Social History Narrative  . None   Past Surgical History  Procedure Laterality Date  . Abdominal hysterectomy  1999  . Tonsillectomy  1981  . Tubal ligation  1997  . Bunions removed  1988  . Edg  12/17/2004  . Electrocardiogram  05/27/2007  . Skin cancer excision    . Cholecystectomy  04/25/2008  . C4 c5 cage insertion  06/28/2013  . L5 s1 rod insertion  11/07/2012   Past Medical History  Diagnosis Date  . ANXIETY 06/10/2007  . ASYMPTOMATIC POSTMENOPAUSAL STATUS 07/19/2009  . Cough 04/02/2009  . DYSLIPIDEMIA 06/28/2008  . GERD 06/10/2007  . 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 HYPERGLYCEMIA 06/28/2008  . HYPERTENSION 06/10/2007  . Irritable bowel syndrome 06/28/2008  . OSTEOARTHRITIS 06/10/2007  . UPPER RESPIRATORY INFECTION 03/26/2009  . WEIGHT GAIN 01/21/2010  . Degenerative arthritis of spine     back and neck   BP 150/96  Pulse 93  Resp 14  Wt 164 lb 12.8 oz (74.753 kg)  SpO2 97%  Opioid Risk Score:   Fall Risk Score:     Review of Systems     Objective:   Physical Exam  Nursing note and vitals  reviewed. Constitutional: She is oriented to person, place, and time. She appears well-developed and well-nourished.  HENT:  Head: Normocephalic and atraumatic.  Neck: Normal range of motion. Neck supple.  Cardiovascular: Normal rate and regular rhythm.   Pulmonary/Chest: Effort normal and breath sounds normal.  Musculoskeletal:  Normal Muscle Bulk and Muscle Testing Reveals: Upper Extremities: Full ROM and Muscle Strength 5/5 Lumbar Paraspinal Tenderness: L-3- L-5 Left Gluteal Maximus Tenderness/ S1 Lower Extremities: Full ROM and Muscle Strength 5/5 Arises from chair with ease Narrow Based gait Using Straight Cane for Support  Neurological: She is alert and oriented to person, place, and time.  Skin: Skin is warm and dry.    Psychiatric: She has a normal mood and affect.          Assessment & Plan:  1. History of chronic lumbar facet disease with spondylosis/DDD/ L4-5 spondylolisthesis.With L4-5 L5-S1 decompression stabilization: Refilled: Hydrocodone 10/325mg  one tablet every 6 hours as needed #120.  2. Depression with anxiety: Continue Xanax, and Effexor. Following Dr. Doyne Keel and Dr. Valentina Shaggy  3. Cervical spondylosis: Stable at this time with no complaints. Continue Medication Regime.  4. Post Concussion Syndrome: Continue Ritalin 10 mg one tablet three times a day. #90.  20 minutes of face to face patient care time was spent during this visit. All questions were encouraged and answered.  F/U in 1 month

## 2014-08-02 ENCOUNTER — Encounter: Payer: Self-pay | Admitting: Endocrinology

## 2014-08-06 ENCOUNTER — Telehealth: Payer: Self-pay | Admitting: Endocrinology

## 2014-08-06 ENCOUNTER — Encounter: Payer: Self-pay | Admitting: Endocrinology

## 2014-08-06 NOTE — Telephone Encounter (Signed)
Hello Dr. Loanne Drilling. Am I suppose to make my own appointment for the Stress Test? Or, will one of your staff members do it? I had another attack this weekend. I am having trouble getting a deep breath and I know something is going on. It's different from my usual panic/anxiety attacks.

## 2014-08-07 ENCOUNTER — Other Ambulatory Visit: Payer: Self-pay

## 2014-08-08 ENCOUNTER — Inpatient Hospital Stay: Admission: RE | Admit: 2014-08-08 | Payer: Self-pay | Source: Ambulatory Visit

## 2014-08-08 ENCOUNTER — Telehealth: Payer: Self-pay

## 2014-08-08 NOTE — Telephone Encounter (Signed)
Bone Density department called stating pt is going to be rescheduled for her bone density till after October the 30th. Pt's insurance will cover bone density every 2 years and it has not been quite 2 years yet.

## 2014-08-14 ENCOUNTER — Other Ambulatory Visit: Payer: Self-pay

## 2014-08-14 DIAGNOSIS — Z1231 Encounter for screening mammogram for malignant neoplasm of breast: Secondary | ICD-10-CM

## 2014-08-16 ENCOUNTER — Ambulatory Visit (INDEPENDENT_AMBULATORY_CARE_PROVIDER_SITE_OTHER)
Admission: RE | Admit: 2014-08-16 | Discharge: 2014-08-16 | Disposition: A | Discharge status: Home / Self Care | Payer: 59 | Source: Ambulatory Visit | Attending: Endocrinology | Admitting: Endocrinology

## 2014-08-16 DIAGNOSIS — I10 Essential (primary) hypertension: Secondary | ICD-10-CM

## 2014-08-16 DIAGNOSIS — Z0189 Encounter for other specified special examinations: Secondary | ICD-10-CM

## 2014-08-16 DIAGNOSIS — N951 Menopausal and female climacteric states: Secondary | ICD-10-CM

## 2014-08-16 DIAGNOSIS — R0600 Dyspnea, unspecified: Secondary | ICD-10-CM

## 2014-08-16 DIAGNOSIS — R739 Hyperglycemia, unspecified: Secondary | ICD-10-CM

## 2014-08-16 DIAGNOSIS — Z23 Encounter for immunization: Secondary | ICD-10-CM

## 2014-08-16 DIAGNOSIS — Z0000 Encounter for general adult medical examination without abnormal findings: Secondary | ICD-10-CM

## 2014-08-17 ENCOUNTER — Encounter: Payer: Self-pay | Admitting: Endocrinology

## 2014-08-17 DIAGNOSIS — Z780 Asymptomatic menopausal state: Secondary | ICD-10-CM | POA: Insufficient documentation

## 2014-08-17 DIAGNOSIS — M810 Age-related osteoporosis without current pathological fracture: Secondary | ICD-10-CM | POA: Insufficient documentation

## 2014-08-17 DIAGNOSIS — M8580 Other specified disorders of bone density and structure, unspecified site: Secondary | ICD-10-CM | POA: Insufficient documentation

## 2014-08-27 ENCOUNTER — Encounter: Payer: 59 | Attending: Physical Medicine and Rehabilitation | Admitting: Registered Nurse

## 2014-08-27 ENCOUNTER — Encounter: Payer: Self-pay | Admitting: Registered Nurse

## 2014-08-27 VITALS — BP 124/68 | HR 100 | Resp 16 | Ht 61.0 in | Wt 168.8 lb

## 2014-08-27 DIAGNOSIS — F4310 Post-traumatic stress disorder, unspecified: Secondary | ICD-10-CM

## 2014-08-27 DIAGNOSIS — F332 Major depressive disorder, recurrent severe without psychotic features: Secondary | ICD-10-CM

## 2014-08-27 DIAGNOSIS — M461 Sacroiliitis, not elsewhere classified: Secondary | ICD-10-CM

## 2014-08-27 DIAGNOSIS — Z79899 Other long term (current) drug therapy: Secondary | ICD-10-CM

## 2014-08-27 DIAGNOSIS — F411 Generalized anxiety disorder: Secondary | ICD-10-CM | POA: Diagnosis present

## 2014-08-27 DIAGNOSIS — M47817 Spondylosis without myelopathy or radiculopathy, lumbosacral region: Secondary | ICD-10-CM | POA: Insufficient documentation

## 2014-08-27 DIAGNOSIS — Z5181 Encounter for therapeutic drug level monitoring: Secondary | ICD-10-CM

## 2014-08-27 MED ORDER — METHYLPHENIDATE HCL 10 MG PO TABS: 10.0000 mg | ORAL_TABLET | Freq: Three times a day (TID) | ORAL | Status: DC

## 2014-08-27 MED ORDER — HYDROCODONE-ACETAMINOPHEN 10-325 MG PO TABS: 1.0000 | ORAL_TABLET | Freq: Four times a day (QID) | ORAL | Status: DC | PRN

## 2014-08-27 NOTE — Progress Notes (Signed)
Subjective:    Patient ID: [name], female    DOB: Sep 26, 1956, 58 y.o.   MRN: [id number]  HPI: [name] is a 58 year old female who returns for follow up for chronic pain and medication refill. She says her pain is located in her left buttock. She rates her pain 8. She has not followed her usual exercise regime due to episodes of dizziness and blackouts. Her PCP has scheduled a stress test.    Pain Inventory Average Pain 8 Pain Right Now 8 My pain is constant, sharp, burning, dull, stabbing, tingling and aching  In the last 24 hours, has pain interfered with the following? General activity 9 Relation with others 10 Enjoyment of life 10 What TIME of day is your pain at its worst? VARIES Sleep (in general) Fair  Pain is worse with: walking, sitting, standing and some activites Pain improves with: rest and medication Relief from Meds: 9  Mobility walk without assistance how many minutes can you walk? 2-3  ability to climb steps?  yes do you drive?  yes  Function disabled: date disabled 08/12/2014  Neuro/Psych weakness numbness tremor tingling trouble walking spasms dizziness confusion depression anxiety  Prior Studies Any changes since last visit?  no  Physicians involved in your care Any changes since last visit?  no   Family History  Problem Relation Age of Onset  . Cancer Mother     cervical  . Cancer Father     colon  . Anxiety disorder Maternal Aunt   . Depression Maternal Aunt   . Anxiety disorder Maternal Uncle   . Depression Maternal Uncle     suicide attempt by gun shot wound to head. Survived  . Alcohol abuse Other   . Drug abuse Other    History   Social History  . Marital Status: Married    Spouse Name: N/A    Number of Children: N/A  . Years of Education: N/A   Social History Main Topics  . Smoking status: Never Smoker   . Smokeless tobacco: Never Used  . Alcohol Use: No  . Drug Use: No  . Sexual Activity: Yes     Other Topics Concern  . None   Social History Narrative   Past Surgical History  Procedure Laterality Date  . Abdominal hysterectomy  1999  . Tonsillectomy  1981  . Tubal ligation  1997  . Bunions removed  1988  . Edg  12/17/2004  . Electrocardiogram  05/27/2007  . Skin cancer excision    . Cholecystectomy  04/25/2008  . C4 c5 cage insertion  06/28/2013  . L5 s1 rod insertion  11/07/2012   Past Medical History  Diagnosis Date  . ANXIETY 06/10/2007  . ASYMPTOMATIC POSTMENOPAUSAL STATUS 07/19/2009  . Cough 04/02/2009  . DYSLIPIDEMIA 06/28/2008  . GERD 06/10/2007  . 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 HYPERGLYCEMIA 06/28/2008  . HYPERTENSION 06/10/2007  . Irritable bowel syndrome 06/28/2008  . OSTEOARTHRITIS 06/10/2007  . UPPER RESPIRATORY INFECTION 03/26/2009  . WEIGHT GAIN 01/21/2010  . Degenerative arthritis of spine     back and neck   Pulse 100  Resp 16  Ht 5\' 1"  (1.549 m)  Wt 168 lb 12.8 oz (76.567 kg)  BMI 31.91 kg/m2  SpO2 97%  Opioid Risk Score:   Fall Risk Score: Low Fall Risk (0-5 points)  Review of Systems  HENT: Negative.   Eyes: Negative.   Respiratory: Negative.   Cardiovascular: Negative.   Gastrointestinal: Negative.   Endocrine:  Negative.   Genitourinary: Negative.   Musculoskeletal: Positive for myalgias.  Skin: Negative.   Allergic/Immunologic: Negative.   Neurological: Positive for dizziness, tremors, weakness and numbness.  Hematological: Negative.        Objective:   Physical Exam        Assessment & Plan:  1. History of chronic lumbar facet disease with spondylosis/DDD/ L4-5 spondylolisthesis.With L4-5 L5-S1 decompression stabilization: Refilled: Hydrocodone 10/325mg  one tablet every 6 hours as needed #120.  2. Depression with anxiety: Continue Xanax, and Effexor. Following Dr. Doyne Keel and Dr. Valentina Shaggy  3. Cervical spondylosis: Stable at this time with no complaints. Continue Medication Regime.  4. Post Concussion Syndrome: Continue Ritalin 10 mg one  tablet three times a day. #90.  20 minutes of face to face patient care time was spent during this visit. All questions were encouraged and answered.  F/U in 1 month

## 2014-08-27 NOTE — Progress Notes (Signed)
Subjective:    Patient ID: [name], female    DOB: [birth year], 58 y.o.   MRN: [id number]  HPI: [name] is a 58 year old female who returns for follow up for chronic pain and medication refill. She says her pain is located in her lower back. She rates her pain 8. Also stated her PCP has not allowed her to follow her usual exercise regime until she has the stress test. It's scheduled 09/18/2014. She says she would like to speak to  Dr. Valentina Evans again, she says he was very supportive. She likes speaking to him, she states she is feeling great and her husband has noticed the change as well. She was encouraged to call his office. She verbalizes understanding.  Pain Inventory Average Pain 5 Pain Right Now 8 My pain is constant, sharp, burning, dull, stabbing, tingling and aching  In the last 24 hours, has pain interfered with the following? General activity 8 Relation with others 8 Enjoyment of life 8 What TIME of day is your pain at its worst? varies Sleep (in general) Fair  Pain is worse with: walking, standing and some activites Pain improves with: rest, heat/ice and medication Relief from Meds: 5  Mobility walk without assistance how many minutes can you walk? 5 ability to climb steps?  no do you drive?  no  Function retired  Neuro/Psych weakness trouble walking depression  Prior Studies Any changes since last visit?  no  Physicians involved in your care Any changes since last visit?  no   Family History  Problem Relation Age of Onset  . Cancer Mother     cervical  . Cancer Father     colon  . Anxiety disorder Maternal Aunt   . Depression Maternal Aunt   . Anxiety disorder Maternal Uncle   . Depression Maternal Uncle     suicide attempt by gun shot wound to head. Survived  . Alcohol abuse Other   . Drug abuse Other    History   Social History  . Marital Status: Married    Spouse Name: N/A    Number of Children: N/A  . Years of Education:  N/A   Social History Main Topics  . Smoking status: Never Smoker   . Smokeless tobacco: Never Used  . Alcohol Use: No  . Drug Use: No  . Sexual Activity: Yes   Other Topics Concern  . None   Social History Narrative   Past Surgical History  Procedure Laterality Date  . Abdominal hysterectomy  1999  . Tonsillectomy  1981  . Tubal ligation  1997  . Bunions removed  1988  . Edg  12/17/2004  . Electrocardiogram  05/27/2007  . Skin cancer excision    . Cholecystectomy  04/25/2008  . C4 c5 cage insertion  06/28/2013  . L5 s1 rod insertion  11/07/2012   Past Medical History  Diagnosis Date  . ANXIETY 06/10/2007  . ASYMPTOMATIC POSTMENOPAUSAL STATUS 07/19/2009  . Cough 04/02/2009  . DYSLIPIDEMIA 06/28/2008  . GERD 06/10/2007  . 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 HYPERGLYCEMIA 06/28/2008  . HYPERTENSION 06/10/2007  . Irritable bowel syndrome 06/28/2008  . OSTEOARTHRITIS 06/10/2007  . UPPER RESPIRATORY INFECTION 03/26/2009  . WEIGHT GAIN 01/21/2010  . Degenerative arthritis of spine     back and neck   BP 124/68 mmHg  Pulse 100  Resp 16  Ht 5\' 1"  (1.549 m)  Wt 168 lb 12.8 oz (76.567 kg)  BMI 31.91 kg/m2  SpO2 97%  Opioid Risk Score:   Fall Risk Score: Low Fall Risk (0-5 points)  Review of Systems  Constitutional: Negative.   HENT: Negative.   Eyes: Negative.   Respiratory: Negative.   Cardiovascular: Negative.   Gastrointestinal: Negative.   Endocrine: Negative.   Genitourinary: Negative.   Musculoskeletal: Positive for myalgias, back pain and joint swelling.  Skin: Negative.   Allergic/Immunologic: Negative.   Neurological: Negative.   Hematological: Negative.   Psychiatric/Behavioral: Positive for dysphoric mood. The patient is nervous/anxious.        Objective:   Physical Exam  Constitutional: She is oriented to person, place, and time. She appears well-developed and well-nourished.  HENT:  Head: Atraumatic.  Neck: Normal range of motion. Neck supple.  Cardiovascular: Normal rate  and regular rhythm.   Pulmonary/Chest: Effort normal and breath sounds normal.  Musculoskeletal:  Normal Muscle Bulk and Muscle Testing Reveals: Upper Extremities: Full ROM and Muscle Strength 5/5 Back without Spinal or Paraspinal tenderness Lower Extremities: Full ROM and Muscle Strength 5/5 Arises from chair with ease Narrow Based gait  Neurological: She is alert and oriented to person, place, and time.  Skin: Skin is warm and dry.  Psychiatric: She has a normal mood and affect.  Nursing note and vitals reviewed.         Assessment & Plan:  1. History of chronic lumbar facet disease with spondylosis/DDD/ L4-5 spondylolisthesis.With L4-5 L5-S1 decompression stabilization: Refilled: Hydrocodone 10/325mg  one tablet every 6 hours as needed #120.  2. Depression with anxiety: Continue Xanax, and Effexor. Following Dr. Doyne Evans and Dr. Valentina Evans  3. Cervical spondylosis: Stable at this time with no complaints. Continue Medication Regime.  4. Post Concussion Syndrome: Continue Ritalin 10 mg one tablet three times a day. #90.   20 minutes of face to face patient care time was spent during this visit. All questions were encouraged and answered.   F/U in 1 month

## 2014-08-27 NOTE — Progress Notes (Deleted)
   Subjective:    Patient ID: [name], female    DOB: November 11, 1955, 58 y.o.   MRN: [id number]  HPI  Pain Inventory Average Pain  Pain Right Now  My pain iS   In the last 24 hours, has pain interfered with the following? General activity  Relation with others  Enjoyment of life  What TIME of day is your pain at its worst?  Sleep (in general)   Pain is worse with:  Pain improves with:  Relief from Meds:   Mobility   Function   Neuro/Psych   Prior Studies   Physicians involved in your care    Family History  Problem Relation Age of Onset  . Cancer Mother     cervical  . Cancer Father     colon  . Anxiety disorder Maternal Aunt   . Depression Maternal Aunt   . Anxiety disorder Maternal Uncle   . Depression Maternal Uncle     suicide attempt by gun shot wound to head. Survived  . Alcohol abuse Other   . Drug abuse Other    History   Social History  . Marital Status: Married    Spouse Name: N/A    Number of Children: N/A  . Years of Education: N/A   Social History Main Topics  . Smoking status: Never Smoker   . Smokeless tobacco: Never Used  . Alcohol Use: No  . Drug Use: No  . Sexual Activity: Yes   Other Topics Concern  . None   Social History Narrative   Past Surgical History  Procedure Laterality Date  . Abdominal hysterectomy  1999  . Tonsillectomy  1981  . Tubal ligation  1997  . Bunions removed  1988  . Edg  12/17/2004  . Electrocardiogram  05/27/2007  . Skin cancer excision    . Cholecystectomy  04/25/2008  . C4 c5 cage insertion  06/28/2013  . L5 s1 rod insertion  11/07/2012   Past Medical History  Diagnosis Date  . ANXIETY 06/10/2007  . ASYMPTOMATIC POSTMENOPAUSAL STATUS 07/19/2009  . Cough 04/02/2009  . DYSLIPIDEMIA 06/28/2008  . GERD 06/10/2007  . 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 HYPERGLYCEMIA 06/28/2008  . HYPERTENSION 06/10/2007  . Irritable bowel syndrome 06/28/2008  . OSTEOARTHRITIS 06/10/2007  . UPPER RESPIRATORY INFECTION 03/26/2009  .  WEIGHT GAIN 01/21/2010  . Degenerative arthritis of spine     back and neck   BP 106/68 mmHg  Pulse 78  Resp 16  Ht 5\' 2"  (1.575 m)  Wt 138 lb (62.596 kg)  BMI 25.23 kg/m2  SpO2 96%  Opioid Risk Score:   Fall Risk Score: Low Fall Risk (0-5 points)  Review of Systems  Eyes: Negative.   Respiratory: Negative.   Cardiovascular: Negative.   Gastrointestinal: Negative.   Endocrine: Positive for cold intolerance.  Genitourinary: Negative.   Musculoskeletal: Positive for myalgias, back pain and joint swelling.  Skin: Negative.   Allergic/Immunologic: Negative.   Neurological:       WEAKNESS, TROUBLE WALKING, DEPRESSION  Psychiatric/Behavioral: Positive for dysphoric mood. The patient is nervous/anxious.        Objective:   Physical Exam        Assessment & Plan:

## 2014-08-28 ENCOUNTER — Other Ambulatory Visit: Payer: Self-pay | Admitting: Endocrinology

## 2014-09-07 ENCOUNTER — Other Ambulatory Visit (HOSPITAL_COMMUNITY): Payer: Self-pay | Admitting: Psychiatry

## 2014-09-12 ENCOUNTER — Ambulatory Visit
Admission: RE | Admit: 2014-09-12 | Discharge: 2014-09-12 | Disposition: A | Discharge status: Home / Self Care | Payer: 59 | Source: Ambulatory Visit

## 2014-09-12 ENCOUNTER — Other Ambulatory Visit (HOSPITAL_COMMUNITY): Payer: Self-pay | Admitting: Psychiatry

## 2014-09-12 DIAGNOSIS — Z1231 Encounter for screening mammogram for malignant neoplasm of breast: Secondary | ICD-10-CM

## 2014-09-17 ENCOUNTER — Telehealth: Payer: Self-pay | Admitting: Registered Nurse

## 2014-09-17 ENCOUNTER — Encounter: Payer: Self-pay | Admitting: Physical Medicine & Rehabilitation

## 2014-09-18 ENCOUNTER — Encounter: Payer: Self-pay | Admitting: Nurse Practitioner

## 2014-09-18 ENCOUNTER — Ambulatory Visit (INDEPENDENT_AMBULATORY_CARE_PROVIDER_SITE_OTHER): Payer: 59 | Admitting: Nurse Practitioner

## 2014-09-18 VITALS — BP 125/70 | HR 106

## 2014-09-18 DIAGNOSIS — Z23 Encounter for immunization: Secondary | ICD-10-CM

## 2014-09-18 DIAGNOSIS — R9431 Abnormal electrocardiogram [ECG] [EKG]: Secondary | ICD-10-CM

## 2014-09-18 DIAGNOSIS — Z0189 Encounter for other specified special examinations: Secondary | ICD-10-CM

## 2014-09-18 DIAGNOSIS — I10 Essential (primary) hypertension: Secondary | ICD-10-CM

## 2014-09-18 DIAGNOSIS — R55 Syncope and collapse: Secondary | ICD-10-CM

## 2014-09-18 DIAGNOSIS — R0600 Dyspnea, unspecified: Secondary | ICD-10-CM

## 2014-09-18 DIAGNOSIS — N951 Menopausal and female climacteric states: Secondary | ICD-10-CM

## 2014-09-18 DIAGNOSIS — R739 Hyperglycemia, unspecified: Secondary | ICD-10-CM

## 2014-09-18 DIAGNOSIS — Z0000 Encounter for general adult medical examination without abnormal findings: Secondary | ICD-10-CM

## 2014-09-18 NOTE — Progress Notes (Signed)
Exercise Treadmill Test  Pre-Exercise Testing Evaluation Rhythm: sinus tachycardia  Rate: 108 bpm     Test  Exercise Tolerance Test Ordering MD: Mertie Moores, MD  Interpreting MD: Truitt Merle, NP  Unique Test No: 1  Treadmill:  1  Indication for ETT: exertional dyspnea  Contraindication to ETT: No   Stress Modality: exercise - treadmill  Cardiac Imaging Performed: non   Protocol: standard Bruce - maximal  Max BP:    Max MPHR (bpm):  163 85% MPR (bpm):  139  MPHR obtained (bpm):   % MPHR obtained:    Reached 85% MPHR (min:sec):   Total Exercise Time (min-sec):    Workload in METS:   Borg Scale:   Reason ETT Terminated:      Study was aborted.    Comments: Patient presents today for routine GXT. Referred here for tachycardia and DOE from her PCP. No known cardiac issues. She tells me however, that she has black out spells EVERY WEEK for at least the last year. She has had spells while sitting and standing. She has had family members with CAD noted. She is short of breath. She was told that she had a "glitch" on her EKG.  Patient's resting EKG is abnormal with inferior Q's, poor R wave progression. She has sinus tach.   Her GXT is cancelled. She will need lexiscan Myoview - patient is quite limited by back pain and has resting abnormal EKG. She will need an echocardiogram to rule out structural defects and we will place an event monitor today - her passing out spells happen at least weekly.   Will get her back to see one of the MD's in consultation. She was advised to NOT be driving.    Recommendations: Change to [place] Echo Event monitor Cardiology Consult  Patient is agreeable to this plan and will call if any problems develop in the interim.   Burtis Junes, RN, [place] [address] [place], [place]  [address] [phone]

## 2014-09-18 NOTE — Patient Instructions (Addendum)
We will arrange for [facility]  We will arrange for echocardiogram  We will be placing an event monitor today  Cardiology consult in 4 to 5 weeks for discussion  You will hear about your stress test and echo results a few days after completed  You should NOT be driving a car until this evaluation is complete  Call the [place] office at [phone] if you have any questions, problems or concerns.

## 2014-09-19 ENCOUNTER — Other Ambulatory Visit: Payer: Self-pay | Admitting: Endocrinology

## 2014-09-19 ENCOUNTER — Encounter (INDEPENDENT_AMBULATORY_CARE_PROVIDER_SITE_OTHER): Payer: 59

## 2014-09-19 ENCOUNTER — Encounter: Payer: Self-pay | Admitting: Radiology

## 2014-09-19 ENCOUNTER — Ambulatory Visit (HOSPITAL_COMMUNITY): Payer: 59 | Attending: Nurse Practitioner | Admitting: Cardiology

## 2014-09-19 DIAGNOSIS — R739 Hyperglycemia, unspecified: Secondary | ICD-10-CM

## 2014-09-19 DIAGNOSIS — I10 Essential (primary) hypertension: Secondary | ICD-10-CM

## 2014-09-19 DIAGNOSIS — R55 Syncope and collapse: Secondary | ICD-10-CM

## 2014-09-19 DIAGNOSIS — Z0000 Encounter for general adult medical examination without abnormal findings: Secondary | ICD-10-CM

## 2014-09-19 DIAGNOSIS — Z23 Encounter for immunization: Secondary | ICD-10-CM

## 2014-09-19 DIAGNOSIS — R0600 Dyspnea, unspecified: Secondary | ICD-10-CM

## 2014-09-19 DIAGNOSIS — N951 Menopausal and female climacteric states: Secondary | ICD-10-CM

## 2014-09-19 DIAGNOSIS — R9431 Abnormal electrocardiogram [ECG] [EKG]: Secondary | ICD-10-CM

## 2014-09-19 NOTE — Progress Notes (Signed)
Echo performed. 

## 2014-09-19 NOTE — Progress Notes (Signed)
Lifewatch 30 day monitor applied. EOS 10-20-14

## 2014-09-20 ENCOUNTER — Ambulatory Visit (HOSPITAL_COMMUNITY): Payer: 59 | Attending: Cardiology | Admitting: Radiology

## 2014-09-20 DIAGNOSIS — R739 Hyperglycemia, unspecified: Secondary | ICD-10-CM

## 2014-09-20 DIAGNOSIS — R0600 Dyspnea, unspecified: Secondary | ICD-10-CM

## 2014-09-20 DIAGNOSIS — R55 Syncope and collapse: Secondary | ICD-10-CM

## 2014-09-20 DIAGNOSIS — R9431 Abnormal electrocardiogram [ECG] [EKG]: Secondary | ICD-10-CM

## 2014-09-20 DIAGNOSIS — I10 Essential (primary) hypertension: Secondary | ICD-10-CM

## 2014-09-20 DIAGNOSIS — Z0000 Encounter for general adult medical examination without abnormal findings: Secondary | ICD-10-CM

## 2014-09-20 DIAGNOSIS — N951 Menopausal and female climacteric states: Secondary | ICD-10-CM

## 2014-09-20 DIAGNOSIS — Z23 Encounter for immunization: Secondary | ICD-10-CM

## 2014-09-20 MED ORDER — TECHNETIUM TC 99M SESTAMIBI GENERIC - CARDIOLITE
11.0000 | Freq: Once | INTRAVENOUS | Status: AC | PRN
  Administered 2014-09-20: 11 via INTRAVENOUS

## 2014-09-20 MED ORDER — TECHNETIUM TC 99M SESTAMIBI GENERIC - CARDIOLITE
33.0000 | Freq: Once | INTRAVENOUS | Status: AC | PRN
  Administered 2014-09-20: 33 via INTRAVENOUS

## 2014-09-20 MED ORDER — AMINOPHYLLINE 25 MG/ML IV SOLN
75.0000 mg | Freq: Once | INTRAVENOUS | Status: AC
  Administered 2014-09-20: 75 mg via INTRAVENOUS

## 2014-09-20 MED ORDER — REGADENOSON 0.4 MG/5ML IV SOLN
0.4000 mg | Freq: Once | INTRAVENOUS | Status: AC
  Administered 2014-09-20: 0.4 mg via INTRAVENOUS

## 2014-09-20 NOTE — Progress Notes (Signed)
[facility] [address] [place], [place] [address] [phone]    Cardiology Nuclear Med Study  [name] is a 58 y.o. female     MRN : [id number]     DOB: [birth year]  Procedure Date: 09/20/2014  Nuclear Med Background Indication for Stress Test:  Evaluation for Ischemia and Abnormal EKG History:  Abnormal Resting EKG Cardiac Risk Factors: Hypertension  Symptoms:  Palpitations, SOB and Syncope   Nuclear Pre-Procedure Caffeine/Decaff Intake:  None NPO After: 6:30 pm   Lungs:  clear O2 Sat: 100% on room air. IV 0.9% NS with Angio Cath:  22g  IV Site: R Hand  IV Started by:  Crissie Figures, RN  Chest Size (in):  40 Cup Size: DD  Height: 5\' 1"  (1.549 m)  Weight:  166 lb (75.297 kg)  BMI:  Body mass index is 31.38 kg/(m^2). Tech Comments: Aminophylline 75 mg given IV for symptoms. All resolved before leaving.    Nuclear Med Study 1 or 2 day study: 1 day  Stress Test Type:  Lexiscan  Reading MD: N/A  Order Authorizing Provider:  Mertie Moores, MD  Resting Radionuclide: Technetium 17m Sestamibi  Resting Radionuclide Dose: 11.0 mCi   Stress Radionuclide:  Technetium 50m Sestamibi  Stress Radionuclide Dose: 33.0 mCi           Stress Protocol Rest HR: 84 Stress HR: 109  Rest BP: 116/71 Stress BP: 126/96  Exercise Time (min): n/a METS: n/a   Predicted Max HR: 163 bpm % Max HR: 66.87 bpm Rate Pressure Product: 13734   Dose of Adenosine (mg):  n/a Dose of Lexiscan: 0.4 mg  Dose of Atropine (mg): n/a Dose of Dobutamine: n/a mcg/kg/min (at max HR)  Stress Test Technologist: Perrin Maltese, EMT-P  Nuclear Technologist:  Earl Many, CNMT     Rest Procedure:  Myocardial perfusion imaging was performed at rest 45 minutes following the intravenous administration of Technetium 47m Sestamibi. Rest ECG: NSR - Normal EKG  Stress Procedure:  The patient received IV Lexiscan 0.4 mg over 15-seconds.  Technetium 4m Sestamibi injected at  30-seconds. This patient had sob,nausea, headache, abdominal pain, and was lt. Headed with the Lexiscan injection. Quantitative spect images were obtained after a 45 minute delay. Stress ECG: No significant change from baseline ECG  QPS Raw Data Images:  Normal; no motion artifact; normal heart/lung ratio. Stress Images:  Normal homogeneous uptake in all areas of the myocardium. Rest Images:  Normal homogeneous uptake in all areas of the myocardium. Subtraction (SDS):  No evidence of ischemia. Transient Ischemic Dilatation (Normal <1.22):  0.96 Lung/Heart Ratio (Normal <0.45):  0.25  Quantitative Gated Spect Images QGS EDV:  66 ml QGS ESV:  16 ml  Impression Exercise Capacity:  Lexiscan with no exercise. BP Response:  Normal blood pressure response. Clinical Symptoms:  No significant symptoms noted. ECG Impression:  No significant ST segment change suggestive of ischemia. Comparison with Prior Nuclear Study: No images to compare  Overall Impression:  Normal stress nuclear study.  No evidence of ischemia.    LV Ejection Fraction: 76%.  LV Wall Motion:  NL LV Function; NL Wall Motion.   Thayer Headings, Brooke Bonito., MD, [facility] 09/20/2014, 4:11 PM [address]  [place] Pager [phone]]

## 2014-09-21 NOTE — Telephone Encounter (Signed)
Called Mrs. Rosasco,  Regarding her email, Left a message to return my call.

## 2014-09-24 ENCOUNTER — Encounter: Payer: 59 | Attending: Physical Medicine and Rehabilitation | Admitting: Registered Nurse

## 2014-09-24 ENCOUNTER — Encounter: Payer: Self-pay | Admitting: Registered Nurse

## 2014-09-24 VITALS — BP 135/78 | HR 95 | Resp 14 | Ht 61.0 in | Wt 167.0 lb

## 2014-09-24 DIAGNOSIS — F411 Generalized anxiety disorder: Secondary | ICD-10-CM | POA: Insufficient documentation

## 2014-09-24 DIAGNOSIS — M47817 Spondylosis without myelopathy or radiculopathy, lumbosacral region: Secondary | ICD-10-CM | POA: Diagnosis present

## 2014-09-24 DIAGNOSIS — Z5181 Encounter for therapeutic drug level monitoring: Secondary | ICD-10-CM

## 2014-09-24 DIAGNOSIS — M461 Sacroiliitis, not elsewhere classified: Secondary | ICD-10-CM | POA: Insufficient documentation

## 2014-09-24 DIAGNOSIS — Z79899 Other long term (current) drug therapy: Secondary | ICD-10-CM

## 2014-09-24 MED ORDER — AMITRIPTYLINE HCL 10 MG PO TABS: ORAL_TABLET | ORAL | Status: DC

## 2014-09-24 MED ORDER — HYDROCODONE-ACETAMINOPHEN 10-325 MG PO TABS: 1.0000 | ORAL_TABLET | Freq: Four times a day (QID) | ORAL | Status: DC | PRN

## 2014-09-24 MED ORDER — METHYLPHENIDATE HCL 10 MG PO TABS: 10.0000 mg | ORAL_TABLET | Freq: Two times a day (BID) | ORAL | Status: DC

## 2014-09-24 MED ORDER — ALPRAZOLAM 0.5 MG PO TABS: 0.5000 mg | ORAL_TABLET | Freq: Three times a day (TID) | ORAL | Status: DC | PRN

## 2014-09-24 NOTE — Progress Notes (Signed)
Subjective:    Patient ID: [name], female    DOB: 07-Mar-1956, 58 y.o.   MRN: [id number]  HPI: Mrs. Philicia Heyne is a 58 year old female who returns for follow up for chronic pain and medication refill. She says her pain is located in her lower back. She rates her pain 8. Her current exercise regime consists of daily activites, cardiologist doesn't want her to perform exercise routine till after all the cardiology workup has been completed. She has ECHO on 07/20/2014, Life-watch cardiac monitor applied on 09/19/14 will continue for five weeks,on 09/20/2014 Lexiscan performed. Mrs. Bruster not seeing Dr. Doyne Keel her choice, she says : she is receiving counseling from her Doristine Bosworth and [facility], she feels much better. Also asked if her Ritalin could be decreased to Twice a day, this has been dome. Instructed to call the office if she needs anything she verbalizes understanding.  Pain Inventory Average Pain 8 Pain Right Now 8 My pain is constant, sharp, burning, dull, stabbing, tingling and aching  In the last 24 hours, has pain interfered with the following? General activity 10 Relation with others 10 Enjoyment of life 10 What TIME of day is your pain at its worst? evening Sleep (in general) Fair  Pain is worse with: walking, bending, sitting, standing, unsure and some activites Pain improves with: rest, heat/ice, pacing activities and medication Relief from Meds: 4  Mobility walk with assistance use a cane ability to climb steps?  yes do you drive?  no Do you have any goals in this area?  yes  Function not employed: date last employed 11/2008 disabled: date disabled 02/2014 I need assistance with the following:  dressing, meal prep, household duties and shopping  Neuro/Psych weakness numbness tremor tingling trouble walking spasms dizziness confusion depression anxiety  Prior Studies Any changes since last visit?  yes bone scan nuclear stress test/heart  monitor  Physicians involved in your care Any changes since last visit?  yes Dr. Pernell Dupre, Cardiologist   Family History  Problem Relation Age of Onset  . Cancer Mother     cervical  . Cancer Father     colon  . Anxiety disorder Maternal Aunt   . Depression Maternal Aunt   . Anxiety disorder Maternal Uncle   . Depression Maternal Uncle     suicide attempt by gun shot wound to head. Survived  . Alcohol abuse Other   . Drug abuse Other    History   Social History  . Marital Status: Married    Spouse Name: N/A    Number of Children: N/A  . Years of Education: N/A   Social History Main Topics  . Smoking status: Never Smoker   . Smokeless tobacco: Never Used  . Alcohol Use: No  . Drug Use: No  . Sexual Activity: Yes   Other Topics Concern  . None   Social History Narrative   Past Surgical History  Procedure Laterality Date  . Abdominal hysterectomy  1999  . Tonsillectomy  1981  . Tubal ligation  1997  . Bunions removed  1988  . Edg  12/17/2004  . Electrocardiogram  05/27/2007  . Skin cancer excision    . Cholecystectomy  04/25/2008  . C4 c5 cage insertion  06/28/2013  . L5 s1 rod insertion  11/07/2012   Past Medical History  Diagnosis Date  . ANXIETY 06/10/2007  . ASYMPTOMATIC POSTMENOPAUSAL STATUS 07/19/2009  . Cough 04/02/2009  . DYSLIPIDEMIA 06/28/2008  . GERD 06/10/2007  . HSV  06/28/2008  . HYPERGLYCEMIA 06/28/2008  . HYPERTENSION 06/10/2007  . Irritable bowel syndrome 06/28/2008  . OSTEOARTHRITIS 06/10/2007  . UPPER RESPIRATORY INFECTION 03/26/2009  . WEIGHT GAIN 01/21/2010  . Degenerative arthritis of spine     back and neck   BP 135/78 mmHg  Pulse 95  Resp 14  Ht 5\' 1"  (1.549 m)  Wt 167 lb (75.751 kg)  BMI 31.57 kg/m2  SpO2 96%  Opioid Risk Score:   Fall Risk Score: Low Fall Risk (0-5 points) (pt has rec'd pamphlet during prior visit)2 Review of Systems  Constitutional: Positive for appetite change.       Weight gain Poor appetite   Respiratory:  Positive for cough and shortness of breath.   Cardiovascular: Positive for leg swelling.  Gastrointestinal: Positive for nausea and abdominal pain.  Musculoskeletal: Positive for gait problem.  Neurological: Positive for dizziness, tremors, weakness and numbness.       Tingling Spasms   Psychiatric/Behavioral: Positive for confusion and dysphoric mood. The patient is nervous/anxious.   All other systems reviewed and are negative.      Objective:   Physical Exam  Constitutional: She is oriented to person, place, and time. She appears well-developed and well-nourished.  HENT:  Head: Normocephalic and atraumatic.  Neck: Normal range of motion. Neck supple.  Cardiovascular: Normal rate and regular rhythm.   Pulmonary/Chest: Effort normal and breath sounds normal.  Musculoskeletal:  Normal Muscle Bulk and Muscle testing Reveals: Upper extremities: Full ROM and Muscle strength 5/5 Lumbar Paraspinal Tenderness: L-3- L-4 Lower Extremities:Full ROM and Muscle Strength 5/5 Arises from chair with ease Narrow based gait    Neurological: She is alert and oriented to person, place, and time.  Skin: Skin is warm and dry.  Psychiatric: She has a normal mood and affect.  Nursing note and vitals reviewed.         Assessment & Plan:  1. History of chronic lumbar facet disease with spondylosis/DDD/ L4-5 spondylolisthesis.With L4-5 L5-S1 decompression stabilization: Refilled: Hydrocodone 10/325mg  one tablet every 6 hours as needed #120.  2. Depression with anxiety: Continue Xanax, and Effexor. Following  Dr. Valentina Shaggy and Doristine Bosworth for Counseling. 3. Cervical spondylosis: Stable at this time with no complaints. Continue Medication Regime.  4. Post Concussion Syndrome:Reduced to Ritalin 10 mg one tablet twice a day 0700 and noon #60.  5. Insomnia: RX: Elavil 10 mg HS 1-2 tablets as needed.  20 minutes of face to face patient care time was spent during this visit. All questions were encouraged  and answered.   F/U in 1 month

## 2014-10-13 ENCOUNTER — Other Ambulatory Visit (HOSPITAL_COMMUNITY): Payer: Self-pay | Admitting: Psychiatry

## 2014-10-16 NOTE — Telephone Encounter (Signed)
Pt needs to schedule appt. Once scheduled we can give enough pills to make it to that appt.

## 2014-10-18 ENCOUNTER — Ambulatory Visit (HOSPITAL_COMMUNITY): Payer: Self-pay | Admitting: Psychiatry

## 2014-10-22 ENCOUNTER — Encounter: Payer: Self-pay | Admitting: Registered Nurse

## 2014-10-22 ENCOUNTER — Encounter: Payer: 59 | Attending: Physical Medicine and Rehabilitation | Admitting: Registered Nurse

## 2014-10-22 ENCOUNTER — Telehealth (HOSPITAL_COMMUNITY): Payer: Self-pay

## 2014-10-22 VITALS — BP 142/74 | HR 102

## 2014-10-22 DIAGNOSIS — M461 Sacroiliitis, not elsewhere classified: Secondary | ICD-10-CM

## 2014-10-22 DIAGNOSIS — M47817 Spondylosis without myelopathy or radiculopathy, lumbosacral region: Secondary | ICD-10-CM | POA: Diagnosis present

## 2014-10-22 DIAGNOSIS — F411 Generalized anxiety disorder: Secondary | ICD-10-CM | POA: Insufficient documentation

## 2014-10-22 DIAGNOSIS — Z5181 Encounter for therapeutic drug level monitoring: Secondary | ICD-10-CM

## 2014-10-22 DIAGNOSIS — Z79899 Other long term (current) drug therapy: Secondary | ICD-10-CM

## 2014-10-22 MED ORDER — METHYLPHENIDATE HCL 10 MG PO TABS: 10.0000 mg | ORAL_TABLET | Freq: Two times a day (BID) | ORAL | Status: DC

## 2014-10-22 MED ORDER — HYDROCODONE-ACETAMINOPHEN 10-325 MG PO TABS: 1.0000 | ORAL_TABLET | Freq: Four times a day (QID) | ORAL | Status: DC | PRN

## 2014-10-22 NOTE — Progress Notes (Signed)
Subjective:    Patient ID: [name], female    DOB: [birth year], 59 y.o.   MRN: [id number]  HPI: [name] is a 59 year old female who returns for follow up for chronic pain and medication refill. She says her pain is located in her lower back and left buttock. She rates her pain 8. Her current exercise regime consists of daily activites, cardiologist doesn't want her to perform exercise untill  she has a follow-up appointment with cardiologist 11/16/14.  Pain Inventory Average Pain 8 Pain Right Now 8 My pain is sharp, burning, dull, stabbing and aching  In the last 24 hours, has pain interfered with the following? General activity 10 Relation with others 8 Enjoyment of life 9 What TIME of day is your pain at its worst? all Sleep (in general) Fair  Pain is worse with: walking, bending, sitting, standing and some activites Pain improves with: therapy/exercise and medication Relief from Meds: 7  Mobility use a cane ability to climb steps?  yes do you drive?  yes  Function disabled: date disabled 2014 I need assistance with the following:  meal prep, household duties and shopping  Neuro/Psych bladder control problems weakness numbness tremor tingling trouble walking spasms dizziness depression anxiety  Prior Studies Any changes since last visit?  no  Physicians involved in your care Any changes since last visit?  no   Family History  Problem Relation Age of Onset  . Cancer Mother     cervical  . Cancer Father     colon  . Anxiety disorder Maternal Aunt   . Depression Maternal Aunt   . Anxiety disorder Maternal Uncle   . Depression Maternal Uncle     suicide attempt by gun shot wound to head. Survived  . Alcohol abuse Other   . Drug abuse Other    History   Social History  . Marital Status: Married    Spouse Name: N/A    Number of Children: N/A  . Years of Education: N/A   Social History Main Topics  . Smoking status: Never Smoker   .  Smokeless tobacco: Never Used  . Alcohol Use: No  . Drug Use: No  . Sexual Activity: Yes   Other Topics Concern  . None   Social History Narrative   Past Surgical History  Procedure Laterality Date  . Abdominal hysterectomy  1999  . Tonsillectomy  1981  . Tubal ligation  1997  . Bunions removed  1988  . Edg  12/17/2004  . Electrocardiogram  05/27/2007  . Skin cancer excision    . Cholecystectomy  04/25/2008  . C4 c5 cage insertion  06/28/2013  . L5 s1 rod insertion  11/07/2012   Past Medical History  Diagnosis Date  . ANXIETY 06/10/2007  . ASYMPTOMATIC POSTMENOPAUSAL STATUS 07/19/2009  . Cough 04/02/2009  . DYSLIPIDEMIA 06/28/2008  . GERD 06/10/2007  . 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 HYPERGLYCEMIA 06/28/2008  . HYPERTENSION 06/10/2007  . Irritable bowel syndrome 06/28/2008  . OSTEOARTHRITIS 06/10/2007  . UPPER RESPIRATORY INFECTION 03/26/2009  . WEIGHT GAIN 01/21/2010  . Degenerative arthritis of spine     back and neck   There were no vitals taken for this visit.  Opioid Risk Score:   Fall Risk Score: Moderate Fall Risk (6-13 points) (previously educated and given handout) Review of Systems  Constitutional: Positive for appetite change.  Respiratory: Positive for cough and shortness of breath.   Musculoskeletal: Positive for gait problem.  Spasms  Neurological: Positive for dizziness, tremors, weakness and numbness.       Tingling  Psychiatric/Behavioral: Positive for dysphoric mood. The patient is nervous/anxious.   All other systems reviewed and are negative.      Objective:   Physical Exam  Constitutional: She is oriented to person, place, and time. She appears well-developed and well-nourished.  HENT:  Head: Normocephalic and atraumatic.  Neck: Normal range of motion. Neck supple.  Cardiovascular: Normal rate and regular rhythm.   Pulmonary/Chest: Effort normal.  Musculoskeletal:  Normal Muscle bulk and Muscle Testing Reveals: Upper Extremities: Full ROM and Muscle  strength 5/5 Lumbar Paraspinal Tenderness: L-3- L-5 Lower extremities: Full ROM and Muscle strength 5/5 Arises from chair with ease Narrow based gait  Neurological: She is alert and oriented to person, place, and time.  Skin: Skin is warm and dry.  Psychiatric: She has a normal mood and affect.  Nursing note and vitals reviewed.         Assessment & Plan:  1. History of chronic lumbar facet disease with spondylosis/DDD/ L4-5 spondylolisthesis.With L4-5 L5-S1 decompression stabilization: Refilled: Hydrocodone 10/325mg  one tablet every 6 hours as needed #120.  2. Depression with anxiety: Continue Xanax, and Effexor. Following Doristine Bosworth for Counseling. 3. Cervical spondylosis: Stable at this time with no complaints. Continue Medication Regime.  4. Post Concussion Syndrome: Continue Ritalin 10 mg one tablet twice a day 0700 and noon #60.  5. Insomnia: RX: Elavil 10 mg HS 1-2 tablets as needed.  20 minutes of face to face patient care time was spent during this visit. All questions were encouraged and answered.   F/U in 1 month

## 2014-11-15 DIAGNOSIS — R002 Palpitations: Secondary | ICD-10-CM | POA: Insufficient documentation

## 2014-11-16 ENCOUNTER — Telehealth: Payer: Self-pay

## 2014-11-16 ENCOUNTER — Ambulatory Visit (INDEPENDENT_AMBULATORY_CARE_PROVIDER_SITE_OTHER): Payer: 59 | Admitting: Interventional Cardiology

## 2014-11-16 ENCOUNTER — Encounter: Payer: Self-pay | Admitting: Interventional Cardiology

## 2014-11-16 VITALS — BP 132/60 | HR 98 | Ht 61.0 in | Wt 169.0 lb

## 2014-11-16 DIAGNOSIS — M810 Age-related osteoporosis without current pathological fracture: Secondary | ICD-10-CM

## 2014-11-16 DIAGNOSIS — E785 Hyperlipidemia, unspecified: Secondary | ICD-10-CM

## 2014-11-16 DIAGNOSIS — R0602 Shortness of breath: Secondary | ICD-10-CM

## 2014-11-16 DIAGNOSIS — R002 Palpitations: Secondary | ICD-10-CM

## 2014-11-16 DIAGNOSIS — R0600 Dyspnea, unspecified: Secondary | ICD-10-CM

## 2014-11-16 DIAGNOSIS — I10 Essential (primary) hypertension: Secondary | ICD-10-CM

## 2014-11-16 LAB — BRAIN NATRIURETIC PEPTIDE: PRO B NATRI PEPTIDE: 15 pg/mL (ref 0.0–100.0)

## 2014-11-16 NOTE — Telephone Encounter (Signed)
-----   Message from [place], MD sent at 11/16/2014  5:17 PM EST ----- Blood test is normal. I believe her overall heart situation is normal. No further testing or treatment is necessary.

## 2014-11-16 NOTE — Patient Instructions (Signed)
Your physician recommends that you continue on your current medications as directed. Please refer to the Current Medication list given to you today.  Lab Today: Bnp  Your physician recommends that you schedule a follow-up appointment as needed

## 2014-11-16 NOTE — Telephone Encounter (Signed)
Pt aware of lab results. Blood test is normal. I believe her overall heart situation is normal. No further testing or treatment is necessary.

## 2014-11-16 NOTE — Progress Notes (Signed)
Patient ID: [name], female   DOB: September 05, 1956, 59 y.o.   MRN: [id number]    Cardiology Office Note   Date:  11/16/2014   ID:  [name], DOB [birth year], MRN [id number]  PCP:  Renato Shin, MD  Cardiologist:   Sinclair Grooms, MD   No chief complaint on file.     History of Present Illness: [name] is a 59 y.o. female who presents for multiple complaints to rule out heart disease. She is a primary patient of Dr. Renato Shin. She's been having dyspnea, fleeting chest discomfort, his sense that she cannot get a deep breath. She has exertional intolerance. Because of these complaints she has had difficult and cardiac workup which includes a thirty-day continuous monitor, an echocardiogram, and a stress Cardiolite. The only abnormality that has been identified is mild to moderate left ventricular hypertrophy. The myocardial perfusion study was low risk. No significant arrhythmias were noted but sinus tachycardia was frequent. LV systolic function is normal.  She has a history of anxiety. She does not smoke.    Past Medical History  Diagnosis Date  . ANXIETY 06/10/2007  . ASYMPTOMATIC POSTMENOPAUSAL STATUS 07/19/2009  . Cough 04/02/2009  . DYSLIPIDEMIA 06/28/2008  . GERD 06/10/2007  . 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 HYPERGLYCEMIA 06/28/2008  . HYPERTENSION 06/10/2007  . Irritable bowel syndrome 06/28/2008  . OSTEOARTHRITIS 06/10/2007  . UPPER RESPIRATORY INFECTION 03/26/2009  . WEIGHT GAIN 01/21/2010  . Degenerative arthritis of spine     back and neck    Past Surgical History  Procedure Laterality Date  . Abdominal hysterectomy  1999  . Tonsillectomy  1981  . Tubal ligation  1997  . Bunions removed  1988  . Edg  12/17/2004  . Electrocardiogram  05/27/2007  . Skin cancer excision    . Cholecystectomy  04/25/2008  . C4 c5 cage insertion  06/28/2013  . L5 s1 rod insertion  11/07/2012     Current Outpatient Prescriptions  Medication Sig Dispense Refill  . acyclovir (ZOVIRAX) 800 MG  tablet TAKE 1 TABLET DAILY 90 tablet 0  . ALPRAZolam (XANAX) 0.5 MG tablet Take 1 tablet (0.5 mg total) by mouth 3 (three) times daily as needed for anxiety. 90 tablet 2  . amitriptyline (ELAVIL) 10 MG tablet Take 1- 2 tablets at bedtime as needed 180 tablet 0  . atorvastatin (LIPITOR) 40 MG tablet TAKE 1 TABLET DAILY 90 tablet 1  . HYDROcodone-acetaminophen (NORCO) 10-325 MG per tablet Take 1 tablet by mouth every 6 (six) hours as needed. 120 tablet 0  . losartan-hydrochlorothiazide (HYZAAR) 100-12.5 MG per tablet Take 1 tablet by mouth daily. 90 tablet 11  . meclizine (ANTIVERT) 12.5 MG tablet TAKE 1 TABLET (12.5 MG TOTAL) BY MOUTH 3 (THREE) TIMES DAILY AS NEEDED. 45 tablet 2  . methylphenidate (RITALIN) 10 MG tablet Take 1 tablet (10 mg total) by mouth 2 (two) times daily with breakfast and lunch. 0700,1200 60 tablet 0  . omeprazole (PRILOSEC) 40 MG capsule TAKE 1 CAPSULE DAILY 90 capsule 1  . venlafaxine XR (EFFEXOR-XR) 150 MG 24 hr capsule Take 2 capsules (300 mg total) by mouth daily. 60 capsule 2   No current facility-administered medications for this visit.    Allergies:   Poison ivy extract; Poison oak extract; and Poison sumac extract    Social History:  The patient  reports that she has never smoked. She has never used smokeless tobacco. She reports that she does not drink alcohol or use illicit drugs.  Family History:  The patient's family history includes Alcohol abuse in her other; Anxiety disorder in her maternal aunt and maternal uncle; Cancer in her father and mother; Depression in her maternal aunt and maternal uncle; Drug abuse in her other.    ROS:  Please see the history of present illness.   Otherwise, review of systems are positive for headache and decreased recent memory.   All other systems are reviewed and negative.    PHYSICAL EXAM: VS:  BP 132/60 mmHg  Pulse 98  Ht 5\' 1"  (1.549 m)  Wt 169 lb (76.658 kg)  BMI 31.95 kg/m2  SpO2 99% , BMI Body mass index is  31.95 kg/(m^2). GEN: Well nourished, well developed, in no acute distress HEENT: normal Neck: no JVD, carotid bruits, or masses Cardiac: RRR; no murmurs, rubs, or gallops,no edema  Respiratory:  clear to auscultation bilaterally, normal work of breathing GI: soft, nontender, nondistended, + BS MS: no deformity or atrophy Skin: warm and dry, no rash Neuro:  Strength and sensation are intact Psych: euthymic mood, full affect   EKG:  EKG is not ordered today. The ekg done recently is normal.   Recent Labs: 03/22/2014: TSH 3.757 07/30/2014: ALT 17; BUN 13; Creatinine 0.8; Hemoglobin 11.9*; Platelets 208.0; Potassium 4.1; Sodium 138 11/16/2014: Pro B Natriuretic peptide (BNP) 15.0    Lipid Panel    Component Value Date/Time   CHOL 180 07/30/2014 1609   TRIG 249.0* 07/30/2014 1609   HDL 39.40 07/30/2014 1609   CHOLHDL 5 07/30/2014 1609   VLDL 49.8* 07/30/2014 1609   LDLCALC 96 08/15/2009 1246   LDLDIRECT 95.5 07/30/2014 1609      Wt Readings from Last 3 Encounters:  11/16/14 169 lb (76.658 kg)  09/24/14 167 lb (75.751 kg)  09/20/14 166 lb (75.297 kg)      Other studies Reviewed: Additional studies/ records that were reviewed today include: Recent cardiovascular tests including an echocardiogram, stress nuclear study, and 30 day monitor were reviewed.. Review of the above records demonstrates: LVH is only significant abnormality   ASSESSMENT AND PLAN:  1. Given LVH on echo, perhaps dyspnea is related to diastolic heart failure. Would do a BNP determination to see if this supports the possibility of diastolic 2. Atypical/nonischemic chest pain 3. History of syncope of uncertain cause with negative workup   Current medicines are reviewed at length with the patient today.  The patient has concerns regarding medicines. He was to note she can get off of some of her current medical therapies.  The following changes have been made:  no change  Labs/ tests ordered today  include:   Orders Placed This Encounter  Procedures  . B Nat Peptide     Disposition:   FU with . Adianna Darwin in PRN    Signed, Sinclair Grooms, MD  11/16/2014 1:23 PM    [facility] [place], [place], [place]  [address] Phone: [phone]; Fax: [phone]

## 2014-11-19 ENCOUNTER — Encounter: Payer: Self-pay | Admitting: Endocrinology

## 2014-11-26 ENCOUNTER — Ambulatory Visit: Payer: Self-pay | Admitting: Registered Nurse

## 2014-11-28 ENCOUNTER — Encounter: Payer: Self-pay | Admitting: Registered Nurse

## 2014-11-28 ENCOUNTER — Encounter: Payer: 59 | Attending: Physical Medicine and Rehabilitation | Admitting: Registered Nurse

## 2014-11-28 VITALS — BP 132/61 | HR 95 | Resp 14

## 2014-11-28 DIAGNOSIS — M47817 Spondylosis without myelopathy or radiculopathy, lumbosacral region: Secondary | ICD-10-CM | POA: Diagnosis present

## 2014-11-28 DIAGNOSIS — M461 Sacroiliitis, not elsewhere classified: Secondary | ICD-10-CM | POA: Insufficient documentation

## 2014-11-28 DIAGNOSIS — F411 Generalized anxiety disorder: Secondary | ICD-10-CM | POA: Diagnosis present

## 2014-11-28 DIAGNOSIS — Z5181 Encounter for therapeutic drug level monitoring: Secondary | ICD-10-CM

## 2014-11-28 DIAGNOSIS — Z79899 Other long term (current) drug therapy: Secondary | ICD-10-CM

## 2014-11-28 DIAGNOSIS — M25561 Pain in right knee: Secondary | ICD-10-CM

## 2014-11-28 DIAGNOSIS — M961 Postlaminectomy syndrome, not elsewhere classified: Secondary | ICD-10-CM

## 2014-11-28 MED ORDER — HYDROCODONE-ACETAMINOPHEN 10-325 MG PO TABS: 1.0000 | ORAL_TABLET | Freq: Four times a day (QID) | ORAL | Status: DC | PRN

## 2014-11-28 MED ORDER — METHYLPHENIDATE HCL 10 MG PO TABS: 10.0000 mg | ORAL_TABLET | Freq: Two times a day (BID) | ORAL | Status: DC

## 2014-11-28 NOTE — Progress Notes (Signed)
Subjective:    Patient ID: [name], female    DOB: 07/20/1956, 58 y.o.   MRN: [id number]  HPI: [name] is a 59 year old female who returns for follow up for chronic pain and medication refill. She says her pain is located in her lower back and right knee. She rates her pain 8. Her current exercise regime is walking daily 30 minutes a day. On 11/26/14 she was walking down her porch steps holding onto the rail and lost her footing and landed on her right knee. She didn't have her cane with her. She was able to get herself up and didn't seek medical attention.  Pain Inventory Average Pain 7 Pain Right Now 8 My pain is constant, sharp, burning, dull, stabbing, tingling and aching  In the last 24 hours, has pain interfered with the following? General activity 7 Relation with others 7 Enjoyment of life 9 What TIME of day is your pain at its worst? all Sleep (in general) Fair  Pain is worse with: walking, sitting, standing and other Pain improves with: rest, heat/ice, medication and other Relief from Meds: 8  Mobility walk with assistance use a cane how many minutes can you walk? 30-45 ability to climb steps?  yes do you drive?  yes Do you have any goals in this area?  yes  Function not employed: date last employed 11/2008 disabled: date disabled 02/2014 I need assistance with the following:  meal prep, household duties and shopping Do you have any goals in this area?  yes  Neuro/Psych weakness numbness tremor tingling spasms dizziness confusion depression anxiety loss of taste or smell  Prior Studies Any changes since last visit?  no  Physicians involved in your care Any changes since last visit?  no   Family History  Problem Relation Age of Onset  . Cancer Mother     cervical  . Cancer Father     colon  . Anxiety disorder Maternal Aunt   . Depression Maternal Aunt   . Anxiety disorder Maternal Uncle   . Depression Maternal Uncle    suicide attempt by gun shot wound to head. Survived  . Alcohol abuse Other   . Drug abuse Other    History   Social History  . Marital Status: Married    Spouse Name: N/A  . Number of Children: N/A  . Years of Education: N/A   Social History Main Topics  . Smoking status: Never Smoker   . Smokeless tobacco: Never Used  . Alcohol Use: No  . Drug Use: No  . Sexual Activity: Yes   Other Topics Concern  . None   Social History Narrative   Past Surgical History  Procedure Laterality Date  . Abdominal hysterectomy  1999  . Tonsillectomy  1981  . Tubal ligation  1997  . Bunions removed  1988  . Edg  12/17/2004  . Electrocardiogram  05/27/2007  . Skin cancer excision    . Cholecystectomy  04/25/2008  . C4 c5 cage insertion  06/28/2013  . L5 s1 rod insertion  11/07/2012   Past Medical History  Diagnosis Date  . ANXIETY 06/10/2007  . ASYMPTOMATIC POSTMENOPAUSAL STATUS 07/19/2009  . Cough 04/02/2009  . DYSLIPIDEMIA 06/28/2008  . GERD 06/10/2007  . 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 HYPERGLYCEMIA 06/28/2008  . HYPERTENSION 06/10/2007  . Irritable bowel syndrome 06/28/2008  . OSTEOARTHRITIS 06/10/2007  . UPPER RESPIRATORY INFECTION 03/26/2009  . WEIGHT GAIN 01/21/2010  . Degenerative arthritis of spine  back and neck   BP 132/61 mmHg  Pulse 95  Resp 14  SpO2 98%  Opioid Risk Score:   Fall Risk Score: Moderate Fall Risk (6-13 points)   Review of Systems  Constitutional:       Loss of taste  Respiratory: Positive for cough, shortness of breath and wheezing.   Cardiovascular: Positive for leg swelling.  Neurological: Positive for dizziness, tremors, weakness and numbness.       Tingling Spasms   Psychiatric/Behavioral: Positive for confusion and dysphoric mood. The patient is nervous/anxious.   All other systems reviewed and are negative.      Objective:   Physical Exam  Constitutional: She is oriented to person, place, and time. She appears well-developed and well-nourished.  HENT:    Head: Normocephalic and atraumatic.  Neck: Normal range of motion. Neck supple.  Cardiovascular: Normal rate and regular rhythm.   Pulmonary/Chest: Effort normal and breath sounds normal.  Musculoskeletal:  Normal Muscle Bulk and Muscle Testing Reveals: Upper Extremities: Full ROM and Muscle Strength 5/5 Back without Spinal or Paraspinal Tenderness Lower Extremities: Full ROM and Muscle strength 5/5 Arises from chair with ease/ Using Straight Cane for Support Narrow Based Gait  Neurological: She is alert and oriented to person, place, and time.  Skin: Skin is warm and dry.  Psychiatric: She has a normal mood and affect.  Nursing note and vitals reviewed.         Assessment & Plan:  1. History of chronic lumbar facet disease with spondylosis/DDD/ L4-5 spondylolisthesis.With L4-5 L5-S1 decompression stabilization: Refilled: Hydrocodone 10/325mg  one tablet every 6 hours as needed #120.  2. Depression with anxiety: Continue Xanax, and Effexor. Continue with [name] for Counseling. 3. Cervical spondylosis: Stable at this time with no complaints. Continue Medication Regime.  4. Post Concussion Syndrome: Continue Ritalin 10 mg one tablet twice a day 0700 and noon #60.  5. Insomnia: RX: Elavil 10 mg HS 1-2 tablets as needed.  20 minutes of face to face patient care time was spent during this visit. All questions were encouraged and answered.   F/U in 1 month

## 2014-12-05 ENCOUNTER — Other Ambulatory Visit: Payer: Self-pay | Admitting: Endocrinology

## 2014-12-05 ENCOUNTER — Encounter: Payer: Self-pay | Admitting: Endocrinology

## 2014-12-05 ENCOUNTER — Other Ambulatory Visit: Payer: Self-pay | Admitting: Registered Nurse

## 2014-12-05 NOTE — Telephone Encounter (Signed)
Last OV 07/30/2014

## 2015-01-02 ENCOUNTER — Encounter: Payer: 59 | Attending: Physical Medicine and Rehabilitation | Admitting: Registered Nurse

## 2015-01-02 ENCOUNTER — Encounter: Payer: Self-pay | Admitting: Registered Nurse

## 2015-01-02 VITALS — BP 134/62 | HR 88 | Resp 14

## 2015-01-02 DIAGNOSIS — Z79899 Other long term (current) drug therapy: Secondary | ICD-10-CM | POA: Diagnosis not present

## 2015-01-02 DIAGNOSIS — M461 Sacroiliitis, not elsewhere classified: Secondary | ICD-10-CM

## 2015-01-02 DIAGNOSIS — M47817 Spondylosis without myelopathy or radiculopathy, lumbosacral region: Secondary | ICD-10-CM | POA: Diagnosis present

## 2015-01-02 DIAGNOSIS — F411 Generalized anxiety disorder: Secondary | ICD-10-CM | POA: Diagnosis present

## 2015-01-02 DIAGNOSIS — G894 Chronic pain syndrome: Secondary | ICD-10-CM

## 2015-01-02 DIAGNOSIS — Z5181 Encounter for therapeutic drug level monitoring: Secondary | ICD-10-CM | POA: Diagnosis not present

## 2015-01-02 MED ORDER — ALPRAZOLAM 0.5 MG PO TABS: 0.5000 mg | ORAL_TABLET | Freq: Three times a day (TID) | ORAL | Status: DC | PRN

## 2015-01-02 MED ORDER — METHYLPHENIDATE HCL 10 MG PO TABS: 10.0000 mg | ORAL_TABLET | Freq: Two times a day (BID) | ORAL | Status: DC

## 2015-01-02 MED ORDER — HYDROCODONE-ACETAMINOPHEN 10-325 MG PO TABS: 1.0000 | ORAL_TABLET | Freq: Four times a day (QID) | ORAL | Status: DC | PRN

## 2015-01-02 NOTE — Progress Notes (Signed)
Subjective:    Patient ID: [name], female    DOB: [birth year], 59 y.o.   MRN: [id number]  HPI: [name] is a 59 year old female who returns for follow up for chronic pain and medication refill. She says her pain is located in her lower back, bilateral hips and left buttock. She rates her pain 8. Her current exercise regime is walking daily 30 minutes a day. Her PHQ-9 score 18 she's receiving counseling from her pastor.  Her uncle has been hospitalilized at [facility] he was thrown off his horse. She feels her PHQ-9 score of 18 is related to this stressful situation. She admits to being depressed no suicidal ideation or plan.  Pain Inventory Average Pain 8 Pain Right Now 8 My pain is constant, sharp, burning, dull, stabbing, tingling and aching  In the last 24 hours, has pain interfered with the following? General activity 10 Relation with others 8 Enjoyment of life 8 What TIME of day is your pain at its worst? daytime Sleep (in general) Fair  Pain is worse with: walking, bending, sitting, inactivity, standing and some activites Pain improves with: rest, heat/ice and medication Relief from Meds: 5  Mobility use a cane how many minutes can you walk? 10-20 ability to climb steps?  yes do you drive?  yes  Function disabled: date disabled 11/2008  Neuro/Psych weakness numbness tremor tingling trouble walking spasms dizziness confusion depression anxiety  Prior Studies Any changes since last visit?  no  Physicians involved in your care Any changes since last visit?  no   Family History  Problem Relation Age of Onset  . Cancer Mother     cervical  . Cancer Father     colon  . Anxiety disorder Maternal Aunt   . Depression Maternal Aunt   . Anxiety disorder Maternal Uncle   . Depression Maternal Uncle     suicide attempt by gun shot wound to head. Survived  . Alcohol abuse Other   . Drug abuse Other    History   Social History  . Marital  Status: Married    Spouse Name: N/A  . Number of Children: N/A  . Years of Education: N/A   Social History Main Topics  . Smoking status: Never Smoker   . Smokeless tobacco: Never Used  . Alcohol Use: No  . Drug Use: No  . Sexual Activity: Yes   Other Topics Concern  . None   Social History Narrative   Past Surgical History  Procedure Laterality Date  . Abdominal hysterectomy  1999  . Tonsillectomy  1981  . Tubal ligation  1997  . Bunions removed  1988  . Edg  12/17/2004  . Electrocardiogram  05/27/2007  . Skin cancer excision    . Cholecystectomy  04/25/2008  . C4 c5 cage insertion  06/28/2013  . L5 s1 rod insertion  11/07/2012   Past Medical History  Diagnosis Date  . ANXIETY 06/10/2007  . ASYMPTOMATIC POSTMENOPAUSAL STATUS 07/19/2009  . Cough 04/02/2009  . DYSLIPIDEMIA 06/28/2008  . GERD 06/10/2007  . 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 HYPERGLYCEMIA 06/28/2008  . HYPERTENSION 06/10/2007  . Irritable bowel syndrome 06/28/2008  . OSTEOARTHRITIS 06/10/2007  . UPPER RESPIRATORY INFECTION 03/26/2009  . WEIGHT GAIN 01/21/2010  . Degenerative arthritis of spine     back and neck   BP 134/62 mmHg  Pulse 88  Resp 14  SpO2 94%  Opioid Risk Score:   Fall Risk Score:  `1  Depression screen PHQ  2/9  Depression screen PHQ 2/9 01/02/2015  Decreased Interest 3  Down, Depressed, Hopeless 1  PHQ - 2 Score 4  Altered sleeping 3  Tired, decreased energy 2  Change in appetite 2  Feeling bad or failure about yourself  1  Trouble concentrating 3  Moving slowly or fidgety/restless 2  Suicidal thoughts 1  PHQ-9 Score 18     Review of Systems  HENT: Negative.   Eyes: Negative.   Respiratory: Negative.   Cardiovascular: Negative.   Gastrointestinal: Negative.   Endocrine: Negative.   Genitourinary: Negative.   Musculoskeletal: Positive for myalgias, back pain, arthralgias and neck pain.  Skin: Negative.   Allergic/Immunologic: Negative.   Neurological: Positive for dizziness, tremors, weakness  and numbness.       Tingling, trouble walking, spasms  Hematological: Negative.   Psychiatric/Behavioral: Positive for confusion and dysphoric mood. The patient is nervous/anxious.        Objective:   Physical Exam  Constitutional: She is oriented to person, place, and time. She appears well-developed and well-nourished.  HENT:  Head: Normocephalic and atraumatic.  Neck: Normal range of motion. Neck supple.  Cardiovascular: Normal rate and regular rhythm.   Pulmonary/Chest: Effort normal and breath sounds normal.  Musculoskeletal:  Normal Muscle Bulk and Muscle Testing Reveals: Upper Extremities: Full ROM and Muscle Strength 5/5 Lumbar Paraspinal Tenderness: L-3- L-5 Lower Extremities: Full ROM and Muscle Strength 5/5 Arises from chair with ease Narrow Based Gait  Neurological: She is alert and oriented to person, place, and time.  Skin: Skin is warm and dry.  Psychiatric: She has a normal mood and affect.  Nursing note and vitals reviewed.         Assessment & Plan:  1. History of chronic lumbar facet disease with spondylosis/DDD/ L4-5 spondylolisthesis.With L4-5 L5-S1 decompression stabilization: Refilled: Hydrocodone 10/325mg  one tablet every 6 hours as needed #120.  2. Depression with anxiety: Continue Xanax, and Effexor. Continue Counseling with [name]. 3. Cervical spondylosis: Stable at this time with no complaints. Continue Medication Regime.  4. Post Concussion Syndrome: Continue Ritalin 10 mg one tablet twice a day 0700 and noon #60.  5. Insomnia: Continue: Elavil 10 mg HS 1-2 tablets as needed.  20 minutes of face to face patient care time was spent during this visit. All questions were encouraged and answered.   F/U in 1 month

## 2015-01-03 ENCOUNTER — Other Ambulatory Visit: Payer: Self-pay | Admitting: Registered Nurse

## 2015-01-04 ENCOUNTER — Other Ambulatory Visit: Payer: Self-pay | Admitting: Endocrinology

## 2015-01-04 LAB — PMP ALCOHOL METABOLITE (ETG): Ethyl Glucuronide (EtG): NEGATIVE ng/mL

## 2015-01-05 LAB — METHYLPHENIDATE METAB, QUANT, U: RITALINIC ACID: 8528 ng/mL — AB (ref <100)

## 2015-01-07 LAB — BENZODIAZEPINES (GC/LC/MS), URINE
Alprazolam metabolite (GC/LC/MS), ur confirm: 176 ng/mL (ref <25)
Clonazepam metabolite (GC/LC/MS), ur confirm: NEGATIVE ng/mL (ref <25)
Flurazepam metabolite (GC/LC/MS), ur confirm: NEGATIVE ng/mL (ref <50)
Lorazepam (GC/LC/MS), ur confirm: NEGATIVE ng/mL (ref <50)
MIDAZOLAMU: NEGATIVE ng/mL (ref <50)
NORDIAZEPAMU: NEGATIVE ng/mL (ref <50)
OXAZEPAMU: NEGATIVE ng/mL (ref <50)
Temazepam (GC/LC/MS), ur confirm: NEGATIVE ng/mL (ref <50)
Triazolam metabolite (GC/LC/MS), ur confirm: NEGATIVE ng/mL (ref <50)

## 2015-01-07 LAB — OPIATES/OPIOIDS (LC/MS-MS)
Codeine Urine: NEGATIVE ng/mL (ref <50)
HYDROCODONE: 3311 ng/mL (ref <50)
Hydromorphone: 135 ng/mL (ref <50)
MORPHINE: NEGATIVE ng/mL (ref <50)
Norhydrocodone, Ur: 6381 ng/mL (ref <50)
Noroxycodone, Ur: NEGATIVE ng/mL (ref <50)
OXYMORPHONE, URINE: NEGATIVE ng/mL (ref <50)
Oxycodone, ur: NEGATIVE ng/mL (ref <50)

## 2015-01-08 LAB — PRESCRIPTION MONITORING PROFILE (SOLSTAS)
Amphetamine/Meth: NEGATIVE ng/mL
Barbiturate Screen, Urine: NEGATIVE ng/mL
Buprenorphine, Urine: NEGATIVE ng/mL
Cannabinoid Scrn, Ur: NEGATIVE ng/mL
Carisoprodol, Urine: NEGATIVE ng/mL
Cocaine Metabolites: NEGATIVE ng/mL
Creatinine, Urine: 84.13 mg/dL (ref >20.0)
FENTANYL URINE: NEGATIVE ng/mL
MDMA URINE: NEGATIVE ng/mL
METHADONE SCREEN, URINE: NEGATIVE ng/mL
Meperidine, Ur: NEGATIVE ng/mL
NITRITES URINE, INITIAL: NEGATIVE ug/mL
Oxycodone Screen, Ur: NEGATIVE ng/mL
PH URINE, INITIAL: 6.8 pH (ref 4.5–8.9)
Propoxyphene: NEGATIVE ng/mL
TRAMADOL UR: NEGATIVE ng/mL
Tapentadol, urine: NEGATIVE ng/mL
ZOLPIDEM, URINE: NEGATIVE ng/mL

## 2015-01-17 NOTE — Progress Notes (Signed)
Urine drug screen for this encounter is consistent for prescribed medication 

## 2015-01-29 ENCOUNTER — Encounter: Payer: 59 | Attending: Physical Medicine and Rehabilitation | Admitting: Registered Nurse

## 2015-01-29 ENCOUNTER — Encounter: Payer: Self-pay | Admitting: Registered Nurse

## 2015-01-29 VITALS — BP 140/64 | HR 108 | Resp 14

## 2015-01-29 DIAGNOSIS — F0781 Postconcussional syndrome: Secondary | ICD-10-CM | POA: Diagnosis not present

## 2015-01-29 DIAGNOSIS — G894 Chronic pain syndrome: Secondary | ICD-10-CM | POA: Diagnosis not present

## 2015-01-29 DIAGNOSIS — M47817 Spondylosis without myelopathy or radiculopathy, lumbosacral region: Secondary | ICD-10-CM | POA: Insufficient documentation

## 2015-01-29 DIAGNOSIS — Z79899 Other long term (current) drug therapy: Secondary | ICD-10-CM

## 2015-01-29 DIAGNOSIS — M461 Sacroiliitis, not elsewhere classified: Secondary | ICD-10-CM | POA: Insufficient documentation

## 2015-01-29 DIAGNOSIS — F411 Generalized anxiety disorder: Secondary | ICD-10-CM

## 2015-01-29 DIAGNOSIS — Z5181 Encounter for therapeutic drug level monitoring: Secondary | ICD-10-CM

## 2015-01-29 MED ORDER — VENLAFAXINE HCL ER 150 MG PO CP24: 150.0000 mg | ORAL_CAPSULE | Freq: Every day | ORAL | Status: DC

## 2015-01-29 MED ORDER — METHYLPHENIDATE HCL 10 MG PO TABS: 10.0000 mg | ORAL_TABLET | Freq: Two times a day (BID) | ORAL | Status: DC

## 2015-01-29 MED ORDER — HYDROCODONE-ACETAMINOPHEN 10-325 MG PO TABS: 1.0000 | ORAL_TABLET | Freq: Four times a day (QID) | ORAL | Status: DC | PRN

## 2015-01-29 NOTE — Progress Notes (Signed)
Subjective:    Patient ID: [name], female    DOB: March 31, 1956, 59 y.o.   MRN: [id number]  HPI: [name] is a 59 year old female who returns for follow up for chronic pain and medication refill. She says her pain is located in her lower back and right knee. She rates her pain 9. Her current exercise regime is walking daily 20 minutes a day. Also states three weeks ago she was in the mall with her sister, she was on the escalator and she looked down and became dizzy and fell forward and landed in her knees. Her sister and pedestrians assisted her up. She didn't seek medical attention.  Pain Inventory Average Pain 8 Pain Right Now 9 My pain is sharp, burning, dull, stabbing, tingling and aching  In the last 24 hours, has pain interfered with the following? General activity 10 Relation with others 10 Enjoyment of life 10 What TIME of day is your pain at its worst? varies Sleep (in general) Fair  Pain is worse with: walking, bending, sitting, inactivity, standing and some activites Pain improves with: rest, heat/ice, pacing activities and medication Relief from Meds: 6  Mobility walk without assistance walk with assistance use a cane how many minutes can you walk? 15-30 ability to climb steps?  yes do you drive?  yes  Function not employed: date last employed 2015 disabled: date disabled 2015 I need assistance with the following:  dressing, meal prep, household duties and shopping  Neuro/Psych bladder control problems weakness numbness tremor tingling trouble walking spasms dizziness confusion depression anxiety loss of taste or smell  Prior Studies Any changes since last visit?  no  Physicians involved in your care Any changes since last visit?  no   Family History  Problem Relation Age of Onset  . Cancer Mother     cervical  . Cancer Father     colon  . Anxiety disorder Maternal Aunt   . Depression Maternal Aunt   . Anxiety disorder  Maternal Uncle   . Depression Maternal Uncle     suicide attempt by gun shot wound to head. Survived  . Alcohol abuse Other   . Drug abuse Other    History   Social History  . Marital Status: Married    Spouse Name: N/A  . Number of Children: N/A  . Years of Education: N/A   Social History Main Topics  . Smoking status: Never Smoker   . Smokeless tobacco: Never Used  . Alcohol Use: No  . Drug Use: No  . Sexual Activity: Yes   Other Topics Concern  . None   Social History Narrative   Past Surgical History  Procedure Laterality Date  . Abdominal hysterectomy  1999  . Tonsillectomy  1981  . Tubal ligation  1997  . Bunions removed  1988  . Edg  12/17/2004  . Electrocardiogram  05/27/2007  . Skin cancer excision    . Cholecystectomy  04/25/2008  . C4 c5 cage insertion  06/28/2013  . L5 s1 rod insertion  11/07/2012   Past Medical History  Diagnosis Date  . ANXIETY 06/10/2007  . ASYMPTOMATIC POSTMENOPAUSAL STATUS 07/19/2009  . Cough 04/02/2009  . DYSLIPIDEMIA 06/28/2008  . GERD 06/10/2007  . 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 HYPERGLYCEMIA 06/28/2008  . HYPERTENSION 06/10/2007  . Irritable bowel syndrome 06/28/2008  . OSTEOARTHRITIS 06/10/2007  . UPPER RESPIRATORY INFECTION 03/26/2009  . WEIGHT GAIN 01/21/2010  . Degenerative arthritis of spine     back  and neck   BP 140/64 mmHg  Pulse 108  Resp 14  SpO2 96%  Opioid Risk Score:   Fall Risk Score: High Fall Risk (>13 points) (previously educated and given handout)`1  Depression screen PHQ 2/9  Depression screen PHQ 2/9 01/02/2015  Decreased Interest 3  Down, Depressed, Hopeless 1  PHQ - 2 Score 4  Altered sleeping 3  Tired, decreased energy 2  Change in appetite 2  Feeling bad or failure about yourself  1  Trouble concentrating 3  Moving slowly or fidgety/restless 2  Suicidal thoughts 1  PHQ-9 Score 18    Review of Systems  Constitutional: Positive for chills, activity change and unexpected weight change.  HENT:       Loss of  taste or smell  Respiratory: Positive for cough and shortness of breath.   Cardiovascular: Positive for leg swelling.  Gastrointestinal: Positive for nausea and vomiting.  Genitourinary:       Bladder control problems  Musculoskeletal:       Spasms  Neurological: Positive for dizziness, tremors, weakness and numbness.       Tingling  Psychiatric/Behavioral: Positive for confusion and dysphoric mood. The patient is nervous/anxious.   All other systems reviewed and are negative.      Objective:   Physical Exam  Constitutional: She is oriented to person, place, and time. She appears well-developed and well-nourished.  HENT:  Head: Normocephalic and atraumatic.  Neck: Normal range of motion. Neck supple.  Cardiovascular: Normal rate and regular rhythm.   [id number]  Pulmonary/Chest: Effort normal and breath sounds normal.  Musculoskeletal:  Normal Muscle Bulk and Muscle Testing Reveals: Upper Extremities: Full ROM and Muscle Strength 5/5 Lumbar Paraspinal Tenderness: L-3- L-5 Lower Extremities: Full ROM and Muscle Strength 5/5   Neurological: She is alert and oriented to person, place, and time.  Skin: Skin is warm and dry.  Psychiatric: She has a normal mood and affect.          Assessment & Plan:  1. History of chronic lumbar facet disease with spondylosis/DDD/ L4-5 spondylolisthesis.With L4-5 L5-S1 decompression stabilization: Refilled: Hydrocodone 10/325mg  one tablet every 6 hours as needed #120.  2. Depression with anxiety: Continue Xanax, and Effexor. Continue Counseling with Doristine Bosworth. 3. Cervical spondylosis: Stable at this time with no complaints. Continue Medication Regime.  4. Post Concussion Syndrome: Continue Ritalin 10 mg one tablet twice a day 0700 and noon #60.  5. Insomnia: Continue: Elavil 10 mg HS 1-2 tablets as needed.  20 minutes of face to face patient care time was spent during this visit. All questions were encouraged and answered.   F/U in 1 month

## 2015-02-26 ENCOUNTER — Encounter: Payer: Self-pay | Admitting: Physical Medicine & Rehabilitation

## 2015-02-26 ENCOUNTER — Encounter: Payer: 59 | Attending: Physical Medicine and Rehabilitation | Admitting: Physical Medicine & Rehabilitation

## 2015-02-26 VITALS — BP 122/81 | HR 101 | Resp 14

## 2015-02-26 DIAGNOSIS — M461 Sacroiliitis, not elsewhere classified: Secondary | ICD-10-CM | POA: Diagnosis present

## 2015-02-26 DIAGNOSIS — M47816 Spondylosis without myelopathy or radiculopathy, lumbar region: Secondary | ICD-10-CM | POA: Diagnosis not present

## 2015-02-26 DIAGNOSIS — F4310 Post-traumatic stress disorder, unspecified: Secondary | ICD-10-CM | POA: Diagnosis not present

## 2015-02-26 DIAGNOSIS — F411 Generalized anxiety disorder: Secondary | ICD-10-CM | POA: Diagnosis not present

## 2015-02-26 DIAGNOSIS — M47817 Spondylosis without myelopathy or radiculopathy, lumbosacral region: Secondary | ICD-10-CM | POA: Insufficient documentation

## 2015-02-26 MED ORDER — HYDROCODONE-ACETAMINOPHEN 10-325 MG PO TABS: 1.0000 | ORAL_TABLET | Freq: Four times a day (QID) | ORAL | Status: DC | PRN

## 2015-02-26 MED ORDER — METHYLPHENIDATE HCL 10 MG PO TABS: 10.0000 mg | ORAL_TABLET | Freq: Two times a day (BID) | ORAL | Status: DC

## 2015-02-26 MED ORDER — GABAPENTIN 300 MG PO CAPS: 300.0000 mg | ORAL_CAPSULE | Freq: Three times a day (TID) | ORAL | Status: DC

## 2015-02-26 MED ORDER — GABAPENTIN 300 MG PO CAPS: 300.0000 mg | ORAL_CAPSULE | Freq: Every day | ORAL | Status: DC

## 2015-02-26 NOTE — Progress Notes (Signed)
Subjective:    Patient ID: [name], female    DOB: 1955/12/08, 59 y.o.   MRN: [id number]  HPI   [name] is here in follow up of her chronic pain and post-concussive syndrome. The effexor ultimately helped her depression once the dose was adjusted down to the 150mg  dose---she is handling stress better and is no longer having an suicidal ideations.    Pain Inventory Average Pain 8 Pain Right Now 8 My pain is sharp, burning, dull, stabbing, tingling and aching  In the last 24 hours, has pain interfered with the following? General activity 8 Relation with others 10 Enjoyment of life 7 What TIME of day is your pain at its worst? all Sleep (in general) Fair  Pain is worse with: bending, sitting, inactivity, unsure and some activites Pain improves with: rest, heat/ice, pacing activities and medication Relief from Meds: 5  Mobility walk without assistance walk with assistance use a cane how many minutes can you walk? 30-60 ability to climb steps?  yes do you drive?  yes Do you have any goals in this area?  yes  Function disabled: date disabled . I need assistance with the following:  dressing, meal prep, household duties and shopping  Neuro/Psych bladder control problems weakness numbness tremor tingling trouble walking spasms dizziness confusion depression anxiety loss of taste or smell  Prior Studies Any changes since last visit?  no  Physicians involved in your care Any changes since last visit?  no   Family History  Problem Relation Age of Onset  . Cancer Mother     cervical  . Cancer Father     colon  . Anxiety disorder Maternal Aunt   . Depression Maternal Aunt   . Anxiety disorder Maternal Uncle   . Depression Maternal Uncle     suicide attempt by gun shot wound to head. Survived  . Alcohol abuse Other   . Drug abuse Other    History   Social History  . Marital Status: Married    Spouse Name: N/A  . Number of Children: N/A  . Years  of Education: N/A   Social History Main Topics  . Smoking status: Never Smoker   . Smokeless tobacco: Never Used  . Alcohol Use: No  . Drug Use: No  . Sexual Activity: Yes   Other Topics Concern  . None   Social History Narrative   Past Surgical History  Procedure Laterality Date  . Abdominal hysterectomy  1999  . Tonsillectomy  1981  . Tubal ligation  1997  . Bunions removed  1988  . Edg  12/17/2004  . Electrocardiogram  05/27/2007  . Skin cancer excision    . Cholecystectomy  04/25/2008  . C4 c5 cage insertion  06/28/2013  . L5 s1 rod insertion  11/07/2012   Past Medical History  Diagnosis Date  . ANXIETY 06/10/2007  . ASYMPTOMATIC POSTMENOPAUSAL STATUS 07/19/2009  . Cough 04/02/2009  . DYSLIPIDEMIA 06/28/2008  . GERD 06/10/2007  . 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 HYPERGLYCEMIA 06/28/2008  . HYPERTENSION 06/10/2007  . Irritable bowel syndrome 06/28/2008  . OSTEOARTHRITIS 06/10/2007  . UPPER RESPIRATORY INFECTION 03/26/2009  . WEIGHT GAIN 01/21/2010  . Degenerative arthritis of spine     back and neck   BP 122/81 mmHg  Pulse 101  Resp 14  SpO2 99%  Opioid Risk Score:   Fall Risk Score: High Fall Risk (>13 points)`1  Depression screen PHQ 2/9  Depression screen [facility] 2/9 01/02/2015  Decreased Interest 3  Down, Depressed, Hopeless 1  PHQ - 2 Score 4  Altered sleeping 3  Tired, decreased energy 2  Change in appetite 2  Feeling bad or failure about yourself  1  Trouble concentrating 3  Moving slowly or fidgety/restless 2  Suicidal thoughts 1  PHQ-9 Score 18     Review of Systems  Constitutional:       Weight gain Poor appetite  Respiratory: Positive for cough.   Gastrointestinal: Positive for abdominal pain and diarrhea.  Musculoskeletal:       Hand swelling  Neurological: Positive for dizziness, tremors, weakness and numbness.       Tingling Spasms   All other systems reviewed and are negative.      Objective:   Physical Exam  Constitutional: She is oriented to  person, place, and time. She appears well-developed and well-nourished.    HENT:  Head: Normocephalic and atraumatic.  Eyes: Conjunctivae and EOM are normal. Pupils are equal, round, and reactive to light.  Neck: Normal range of motion.  Cardiovascular: Normal rate.  Pulmonary/Chest: Effort normal and breath sounds normal.  Abdominal: Soft. Bowel sounds are normal.  Musculoskeletal: Normal range of motion.  Back tender to palpation and extension today more than flexion. SIJ, PSIS's are painful. lower lumbar paraspinals are tight. Walks with cane. Reasonable balance and weight shift Neurological: memory and attention are better but still needs redirection at times. . Strength symmetrical. Sensory is normal. DTR's 1+.  Skin: Skin is warm.  Psychiatric: affect MUCH improved.   Assessment & Plan:   ASSESSMENT:  1. 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  s/p L4-5 L5-S1 decompression stabilization with substantial mprovement of her back and right leg pain.  2. Left knee meniscus injury.  3. Depression with anxiety. Much mproved with effexor and spiritual/social supports in place 4. Cervical spondylosis/Myofascial pain.- see below  5. Concussion on 8/2 with post concussion syndrome- she has ongoing memory and concentration deficits.  6. 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 Effexor 150mg  daily.  2. Reviewed appropriate posture and techniques.  3. Hydrocodone will continue for breakthrough pain 10/325 q6 prn #120  4. May continue melatonin to assist with sleep architecture  4. Add gabapentin 300mg  qhs for sleep/radicular pain---stop elavil   5. Maintain ritalin to 10mg  tid for attention/arousal---   6 .Follow up in about 1 months. 30 minutes of face to face patient care time were spent during this visit. All questions were encouraged and answered.

## 2015-02-26 NOTE — Patient Instructions (Signed)
CONTINUE TO WORK DAILY ON EXERCISE AND POSTURE.   STRETCH BEFORE AND AFTER PHYSICAL ACTIVITY

## 2015-03-07 ENCOUNTER — Other Ambulatory Visit: Payer: Self-pay | Admitting: Endocrinology

## 2015-03-09 ENCOUNTER — Other Ambulatory Visit: Payer: Self-pay | Admitting: Registered Nurse

## 2015-03-18 ENCOUNTER — Encounter: Payer: Self-pay | Admitting: Endocrinology

## 2015-04-01 ENCOUNTER — Encounter: Payer: 59 | Attending: Physical Medicine and Rehabilitation | Admitting: Registered Nurse

## 2015-04-01 ENCOUNTER — Encounter: Payer: Self-pay | Admitting: Registered Nurse

## 2015-04-01 ENCOUNTER — Other Ambulatory Visit: Payer: Self-pay | Admitting: Registered Nurse

## 2015-04-01 ENCOUNTER — Telehealth: Payer: Self-pay | Admitting: Endocrinology

## 2015-04-01 VITALS — BP 118/65 | HR 97 | Resp 16

## 2015-04-01 DIAGNOSIS — G894 Chronic pain syndrome: Secondary | ICD-10-CM | POA: Diagnosis not present

## 2015-04-01 DIAGNOSIS — F411 Generalized anxiety disorder: Secondary | ICD-10-CM

## 2015-04-01 DIAGNOSIS — Z79899 Other long term (current) drug therapy: Secondary | ICD-10-CM

## 2015-04-01 DIAGNOSIS — M461 Sacroiliitis, not elsewhere classified: Secondary | ICD-10-CM | POA: Diagnosis present

## 2015-04-01 DIAGNOSIS — M47817 Spondylosis without myelopathy or radiculopathy, lumbosacral region: Secondary | ICD-10-CM | POA: Insufficient documentation

## 2015-04-01 DIAGNOSIS — F32A Depression, unspecified: Secondary | ICD-10-CM

## 2015-04-01 DIAGNOSIS — Z5181 Encounter for therapeutic drug level monitoring: Secondary | ICD-10-CM

## 2015-04-01 DIAGNOSIS — F329 Major depressive disorder, single episode, unspecified: Secondary | ICD-10-CM

## 2015-04-01 MED ORDER — ALPRAZOLAM 0.5 MG PO TABS: 0.5000 mg | ORAL_TABLET | Freq: Three times a day (TID) | ORAL | Status: DC | PRN

## 2015-04-01 MED ORDER — HYDROCODONE-ACETAMINOPHEN 10-325 MG PO TABS: 1.0000 | ORAL_TABLET | Freq: Four times a day (QID) | ORAL | Status: DC | PRN

## 2015-04-01 MED ORDER — METHYLPHENIDATE HCL 10 MG PO TABS: 10.0000 mg | ORAL_TABLET | Freq: Two times a day (BID) | ORAL | Status: DC

## 2015-04-01 NOTE — Telephone Encounter (Signed)
please call patient: We got an "unread message alert."  Please give pt the message, thanks.

## 2015-04-01 NOTE — Telephone Encounter (Signed)
I contacted the pt and advised her Acyclovir has been sent. Requested call back from the pt to scheduled her annual physical appointment.

## 2015-04-01 NOTE — Progress Notes (Signed)
Subjective:    Patient ID: [name], female    DOB: 12/21/1955, 59 y.o.   MRN: [id number]  HPI: [name] is a 59 year old female who returns for follow up for chronic pain and medication refill. She says her pain is located in her lower back. She rates her pain 9. Her current exercise regime is walking and gardening. Also states "her father was hospitalized with mini strokes", she's tearful emotional support given. All questions answered.  She's going to call Dr. Valentina Shaggy for follow up appointment due to depression as it relates to her father. She denies any suicidal ideation or plan.  Pain Inventory Average Pain 9 Pain Right Now 9 My pain is constant, sharp, burning, dull, stabbing, tingling and aching  In the last 24 hours, has pain interfered with the following? General activity 10 Relation with others 10 Enjoyment of life 10 What TIME of day is your pain at its worst? all Sleep (in general) Fair  Pain is worse with: walking, sitting, standing and some activites Pain improves with: rest, heat/ice, pacing activities and medication Relief from Meds: 6  Mobility walk with assistance use a cane how many minutes can you walk? 30 do you drive?  yes  Function disabled: date disabled 02/2014 I need assistance with the following:  meal prep, household duties and shopping  Neuro/Psych weakness numbness tremor tingling spasms dizziness confusion depression anxiety loss of taste or smell  Prior Studies Any changes since last visit?  no  Physicians involved in your care Any changes since last visit?  no   Family History  Problem Relation Age of Onset  . Cancer Mother     cervical  . Cancer Father     colon  . Anxiety disorder Maternal Aunt   . Depression Maternal Aunt   . Anxiety disorder Maternal Uncle   . Depression Maternal Uncle     suicide attempt by gun shot wound to head. Survived  . Alcohol abuse Other   . Drug abuse Other    History    Social History  . Marital Status: Married    Spouse Name: N/A  . Number of Children: N/A  . Years of Education: N/A   Social History Main Topics  . Smoking status: Never Smoker   . Smokeless tobacco: Never Used  . Alcohol Use: No  . Drug Use: No  . Sexual Activity: Yes   Other Topics Concern  . None   Social History Narrative   Past Surgical History  Procedure Laterality Date  . Abdominal hysterectomy  1999  . Tonsillectomy  1981  . Tubal ligation  1997  . Bunions removed  1988  . Edg  12/17/2004  . Electrocardiogram  05/27/2007  . Skin cancer excision    . Cholecystectomy  04/25/2008  . C4 c5 cage insertion  06/28/2013  . L5 s1 rod insertion  11/07/2012   Past Medical History  Diagnosis Date  . ANXIETY 06/10/2007  . ASYMPTOMATIC POSTMENOPAUSAL STATUS 07/19/2009  . Cough 04/02/2009  . DYSLIPIDEMIA 06/28/2008  . GERD 06/10/2007  . 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 HYPERGLYCEMIA 06/28/2008  . HYPERTENSION 06/10/2007  . Irritable bowel syndrome 06/28/2008  . OSTEOARTHRITIS 06/10/2007  . UPPER RESPIRATORY INFECTION 03/26/2009  . WEIGHT GAIN 01/21/2010  . Degenerative arthritis of spine     back and neck   BP 118/65 mmHg  Pulse 97  Resp 16  SpO2 97%  Opioid Risk Score:   Fall Risk Score: Low Fall Risk (0-5  points) (previously educated and given handout)`1  Depression screen PHQ 2/9  Depression screen PHQ 2/9 01/02/2015  Decreased Interest 3  Down, Depressed, Hopeless 1  PHQ - 2 Score 4  Altered sleeping 3  Tired, decreased energy 2  Change in appetite 2  Feeling bad or failure about yourself  1  Trouble concentrating 3  Moving slowly or fidgety/restless 2  Suicidal thoughts 1  PHQ-9 Score 18    Review of Systems  Constitutional: Positive for appetite change.  Respiratory: Positive for cough and shortness of breath.   Cardiovascular: Positive for leg swelling.  Gastrointestinal: Positive for nausea.  Musculoskeletal:       Spasms  Neurological: Positive for dizziness,  tremors, weakness and numbness.       Tingling  Psychiatric/Behavioral: Positive for confusion and dysphoric mood. The patient is nervous/anxious.   All other systems reviewed and are negative.      Objective:   Physical Exam  Constitutional: She is oriented to person, place, and time. She appears well-developed and well-nourished.  HENT:  Head: Normocephalic and atraumatic.  Neck: Normal range of motion. Neck supple.  Cardiovascular: Normal rate and regular rhythm.   Pulmonary/Chest: Effort normal and breath sounds normal.  Musculoskeletal:  Normal Muscle Bulk and Muscle Testing Reveals: Upper Extremities: Full ROM and Muscle Strength 5/5 Lumbar Paraspinal Tenderness: L-3- L-5 Lower Extremities: Full ROM and Muscle Strength 5/5 Arises from chair with ease/ Using Straight Cane for Support Narrow Based gait    Neurological: She is alert and oriented to person, place, and time.  Skin: Skin is warm and dry.  Psychiatric: She has a normal mood and affect.  Nursing note and vitals reviewed.         Assessment & Plan:  1. History of chronic lumbar facet disease with spondylosis/DDD/ L4-5 spondylolisthesis.With L4-5 L5-S1 decompression stabilization: Refilled: Hydrocodone 10/325mg  one tablet every 6 hours as needed #120. Second script given to accommodate scheduled appointment. 2. Depression with anxiety: Continue Xanax, and Effexor. F/U with Dr. Valentina Shaggy. 3. Cervical spondylosis: Stable at this time with no complaints. Continue Medication Regime.  4. Post Concussion Syndrome: Continue Ritalin 10 mg one tablet twice a day 0700 and noon #60.Second script given to accommodate scheduled appointment.  30 minutes of face to face patient care time was spent during this visit. All questions were encouraged and answered.   F/U in 1 month

## 2015-04-03 LAB — PMP ALCOHOL METABOLITE (ETG): ETGU: NEGATIVE ng/mL

## 2015-04-05 ENCOUNTER — Other Ambulatory Visit: Payer: Self-pay | Admitting: Endocrinology

## 2015-04-05 LAB — BENZODIAZEPINES (GC/LC/MS), URINE
Alprazolam metabolite (GC/LC/MS), ur confirm: 87 ng/mL (ref <25)
CLONAZEPAU: NEGATIVE ng/mL (ref <25)
Flurazepam metabolite (GC/LC/MS), ur confirm: NEGATIVE ng/mL (ref <50)
LORAZEPAMU: NEGATIVE ng/mL (ref <50)
Midazolam (GC/LC/MS), ur confirm: NEGATIVE ng/mL (ref <50)
Nordiazepam (GC/LC/MS), ur confirm: NEGATIVE ng/mL (ref <50)
Oxazepam (GC/LC/MS), ur confirm: NEGATIVE ng/mL (ref <50)
TRIAZOLAMU: NEGATIVE ng/mL (ref <50)
Temazepam (GC/LC/MS), ur confirm: NEGATIVE ng/mL (ref <50)

## 2015-04-05 LAB — OPIATES/OPIOIDS (LC/MS-MS)
Codeine Urine: NEGATIVE ng/mL (ref <50)
HYDROCODONE: 862 ng/mL (ref <50)
HYDROMORPHONE: 107 ng/mL (ref <50)
Morphine Urine: NEGATIVE ng/mL (ref <50)
NORHYDROCODONE, UR: 1458 ng/mL (ref <50)
Noroxycodone, Ur: NEGATIVE ng/mL (ref <50)
Oxycodone, ur: NEGATIVE ng/mL (ref <50)
Oxymorphone: NEGATIVE ng/mL (ref <50)

## 2015-04-06 LAB — PRESCRIPTION MONITORING PROFILE (SOLSTAS)
AMPHETAMINE/METH: NEGATIVE ng/mL
BARBITURATE SCREEN, URINE: NEGATIVE ng/mL
Buprenorphine, Urine: NEGATIVE ng/mL
CANNABINOID SCRN UR: NEGATIVE ng/mL
CARISOPRODOL, URINE: NEGATIVE ng/mL
COCAINE METABOLITES: NEGATIVE ng/mL
Creatinine, Urine: 41.23 mg/dL (ref >20.0)
FENTANYL URINE: NEGATIVE ng/mL
MDMA URINE: NEGATIVE ng/mL
Meperidine, Ur: NEGATIVE ng/mL
Methadone Screen, Urine: NEGATIVE ng/mL
Nitrites, Initial: NEGATIVE ug/mL
Oxycodone Screen, Ur: NEGATIVE ng/mL
Propoxyphene: NEGATIVE ng/mL
TAPENTADOLUR: NEGATIVE ng/mL
Tramadol Scrn, Ur: NEGATIVE ng/mL
ZOLPIDEM, URINE: NEGATIVE ng/mL
pH, Initial: 6.6 pH (ref 4.5–8.9)

## 2015-04-10 NOTE — Progress Notes (Signed)
Urine drug screen for this encounter is consistent for prescribed medication 

## 2015-04-19 ENCOUNTER — Ambulatory Visit (INDEPENDENT_AMBULATORY_CARE_PROVIDER_SITE_OTHER): Payer: 59 | Admitting: Endocrinology

## 2015-04-19 VITALS — BP 138/70 | HR 108 | Temp 98.4°F | Ht 61.0 in | Wt 167.0 lb

## 2015-04-19 DIAGNOSIS — L259 Unspecified contact dermatitis, unspecified cause: Secondary | ICD-10-CM

## 2015-04-19 MED ORDER — FLUOCINONIDE 0.05 % EX CREA: 1.0000 "application " | TOPICAL_CREAM | Freq: Four times a day (QID) | CUTANEOUS | Status: DC

## 2015-04-19 NOTE — Progress Notes (Signed)
Subjective:    Patient ID: [name], female    DOB: 12-09-1955, 59 y.o.   MRN: [id number]  HPI Pt states 1 week of moderate rash on the thighs and legs, but no assoc itching.  She feels this was precip by moving her parents' lawn.   Past Medical History  Diagnosis Date  . ANXIETY 06/10/2007  . ASYMPTOMATIC POSTMENOPAUSAL STATUS 07/19/2009  . Cough 04/02/2009  . DYSLIPIDEMIA 06/28/2008  . GERD 06/10/2007  . 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 HYPERGLYCEMIA 06/28/2008  . HYPERTENSION 06/10/2007  . Irritable bowel syndrome 06/28/2008  . OSTEOARTHRITIS 06/10/2007  . UPPER RESPIRATORY INFECTION 03/26/2009  . WEIGHT GAIN 01/21/2010  . Degenerative arthritis of spine     back and neck    Past Surgical History  Procedure Laterality Date  . Abdominal hysterectomy  1999  . Tonsillectomy  1981  . Tubal ligation  1997  . Bunions removed  1988  . Edg  12/17/2004  . Electrocardiogram  05/27/2007  . Skin cancer excision    . Cholecystectomy  04/25/2008  . C4 c5 cage insertion  06/28/2013  . L5 s1 rod insertion  11/07/2012    History   Social History  . Marital Status: Married    Spouse Name: N/A  . Number of Children: N/A  . Years of Education: N/A   Occupational History  . Not on file.   Social History Main Topics  . Smoking status: Never Smoker   . Smokeless tobacco: Never Used  . Alcohol Use: No  . Drug Use: No  . Sexual Activity: Yes   Other Topics Concern  . Not on file   Social History Narrative    Current Outpatient Prescriptions on File Prior to Visit  Medication Sig Dispense Refill  . acyclovir (ZOVIRAX) 800 MG tablet TAKE 1 TABLET DAILY 90 tablet 3  . ALPRAZolam (XANAX) 0.5 MG tablet Take 1 tablet (0.5 mg total) by mouth 3 (three) times daily as needed for anxiety. 90 tablet 2  . atorvastatin (LIPITOR) 40 MG tablet TAKE 1 TABLET DAILY 90 tablet 0  . gabapentin (NEURONTIN) 300 MG capsule Take 1 capsule (300 mg total) by mouth at bedtime. 30 capsule 3  . HYDROcodone-acetaminophen  (NORCO) 10-325 MG per tablet Take 1 tablet by mouth every 6 (six) hours as needed. 120 tablet 0  . losartan-hydrochlorothiazide (HYZAAR) 100-12.5 MG per tablet Take 1 tablet by mouth daily. 90 tablet 11  . meclizine (ANTIVERT) 12.5 MG tablet TAKE 1 TABLET (12.5 MG TOTAL) BY MOUTH 3 (THREE) TIMES DAILY AS NEEDED. 45 tablet 2  . methylphenidate (RITALIN) 10 MG tablet Take 1 tablet (10 mg total) by mouth 2 (two) times daily with breakfast and lunch. 0700,1200 60 tablet 0  . omeprazole (PRILOSEC) 40 MG capsule TAKE 1 CAPSULE DAILY 90 capsule 0  . venlafaxine XR (EFFEXOR-XR) 150 MG 24 hr capsule Take 1 capsule (150 mg total) by mouth daily with breakfast. 90 capsule 3   No current facility-administered medications on file prior to visit.    Allergies  Allergen Reactions  . Poison Ivy Extract [Extract Of Poison Ivy] Rash  . Poison Oak Extract [Extract Of Poison Oak] Rash  . Poison Sumac Extract Rash    Family History  Problem Relation Age of Onset  . Cancer Mother     cervical  . Cancer Father     colon  . Anxiety disorder Maternal Aunt   . Depression Maternal Aunt   . Anxiety disorder Maternal Uncle   .  Depression Maternal Uncle     suicide attempt by gun shot wound to head. Survived  . Alcohol abuse Other   . Drug abuse Other     BP 138/70 mmHg  Pulse 108  Temp(Src) 98.4 F (36.9 C) (Oral)  Ht 5\' 1"  (1.549 m)  Wt 167 lb (75.751 kg)  BMI 31.57 kg/m2  SpO2 93%    Review of Systems Denies fever and sob.     Objective:   Physical Exam VITAL SIGNS:  See vs page GENERAL: no distress Skin: moderate urticaria on both LE's.        Assessment & Plan:  Contact dermatitis, new.  Patient is advised the following: Patient Instructions  i have sent a prescription to your pharmacy, for a steroid cream. I hope you feel better soon.  If you don't feel better by next week, please call back.  Please call sooner if you get worse. Please come back for a regular physical  appointment in 4 months (must be after 07/31/15).

## 2015-04-19 NOTE — Patient Instructions (Addendum)
i have sent a prescription to your pharmacy, for a steroid cream. I hope you feel better soon.  If you don't feel better by next week, please call back.  Please call sooner if you get worse. Please come back for a regular physical appointment in 4 months (must be after 07/31/15).

## 2015-05-08 ENCOUNTER — Encounter: Payer: 59 | Attending: Physical Medicine and Rehabilitation | Admitting: Registered Nurse

## 2015-05-08 ENCOUNTER — Encounter: Payer: Self-pay | Admitting: Registered Nurse

## 2015-05-08 VITALS — BP 144/70 | HR 94 | Resp 14

## 2015-05-08 DIAGNOSIS — M461 Sacroiliitis, not elsewhere classified: Secondary | ICD-10-CM | POA: Insufficient documentation

## 2015-05-08 DIAGNOSIS — F332 Major depressive disorder, recurrent severe without psychotic features: Secondary | ICD-10-CM | POA: Diagnosis not present

## 2015-05-08 DIAGNOSIS — M47817 Spondylosis without myelopathy or radiculopathy, lumbosacral region: Secondary | ICD-10-CM | POA: Insufficient documentation

## 2015-05-08 DIAGNOSIS — M961 Postlaminectomy syndrome, not elsewhere classified: Secondary | ICD-10-CM | POA: Diagnosis not present

## 2015-05-08 DIAGNOSIS — F411 Generalized anxiety disorder: Secondary | ICD-10-CM | POA: Diagnosis present

## 2015-05-08 DIAGNOSIS — Z79899 Other long term (current) drug therapy: Secondary | ICD-10-CM

## 2015-05-08 DIAGNOSIS — Z5181 Encounter for therapeutic drug level monitoring: Secondary | ICD-10-CM

## 2015-05-08 DIAGNOSIS — G894 Chronic pain syndrome: Secondary | ICD-10-CM | POA: Diagnosis not present

## 2015-05-08 MED ORDER — HYDROCODONE-ACETAMINOPHEN 10-325 MG PO TABS: 1.0000 | ORAL_TABLET | Freq: Four times a day (QID) | ORAL | Status: DC | PRN

## 2015-05-08 MED ORDER — METHYLPHENIDATE HCL 10 MG PO TABS: 10.0000 mg | ORAL_TABLET | Freq: Two times a day (BID) | ORAL | Status: DC

## 2015-05-08 NOTE — Progress Notes (Signed)
Subjective:    Patient ID: [name], female    DOB: 1955-12-14, 59 y.o.   MRN: [id number]  HPI: [name] is a 59 year old female who returns for follow up for chronic pain and medication refill. She says her pain is located in her lower back. She rates her pain 8. Her current exercise regime is walking and gardening.  Pain Inventory Average Pain 8 Pain Right Now 8 My pain is intermittent, constant, sharp, burning, dull, stabbing, tingling and aching  In the last 24 hours, has pain interfered with the following? General activity 10 Relation with others 9 Enjoyment of life 9 What TIME of day is your pain at its worst? VARIES Sleep (in general) Fair  Pain is worse with: walking, sitting, standing, unsure and some activites Pain improves with: rest, heat/ice, pacing activities and medication Relief from Meds: 8  Mobility use a cane how many minutes can you walk? 30 ability to climb steps?  yes do you drive?  yes  Function disabled: date disabled 02/2014  Neuro/Psych weakness numbness tremor tingling trouble walking spasms dizziness depression anxiety  Prior Studies Any changes since last visit?  no  Physicians involved in your care Any changes since last visit?  no   Family History  Problem Relation Age of Onset  . Cancer Mother     cervical  . Cancer Father     colon  . Anxiety disorder Maternal Aunt   . Depression Maternal Aunt   . Anxiety disorder Maternal Uncle   . Depression Maternal Uncle     suicide attempt by gun shot wound to head. Survived  . Alcohol abuse Other   . Drug abuse Other    History   Social History  . Marital Status: Married    Spouse Name: N/A  . Number of Children: N/A  . Years of Education: N/A   Social History Main Topics  . Smoking status: Never Smoker   . Smokeless tobacco: Never Used  . Alcohol Use: No  . Drug Use: No  . Sexual Activity: Yes   Other Topics Concern  . None   Social History  Narrative   Past Surgical History  Procedure Laterality Date  . Abdominal hysterectomy  1999  . Tonsillectomy  1981  . Tubal ligation  1997  . Bunions removed  1988  . Edg  12/17/2004  . Electrocardiogram  05/27/2007  . Skin cancer excision    . Cholecystectomy  04/25/2008  . C4 c5 cage insertion  06/28/2013  . L5 s1 rod insertion  11/07/2012   Past Medical History  Diagnosis Date  . ANXIETY 06/10/2007  . ASYMPTOMATIC POSTMENOPAUSAL STATUS 07/19/2009  . Cough 04/02/2009  . DYSLIPIDEMIA 06/28/2008  . GERD 06/10/2007  . 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 HYPERGLYCEMIA 06/28/2008  . HYPERTENSION 06/10/2007  . Irritable bowel syndrome 06/28/2008  . OSTEOARTHRITIS 06/10/2007  . UPPER RESPIRATORY INFECTION 03/26/2009  . WEIGHT GAIN 01/21/2010  . Degenerative arthritis of spine     back and neck   BP 144/70 mmHg  Pulse 94  Resp 14  SpO2 97%  Opioid Risk Score:   Fall Risk Score:  `1  Depression screen PHQ 2/9  Depression screen PHQ 2/9 01/02/2015  Decreased Interest 3  Down, Depressed, Hopeless 1  PHQ - 2 Score 4  Altered sleeping 3  Tired, decreased energy 2  Change in appetite 2  Feeling bad or failure about yourself  1  Trouble concentrating 3  Moving slowly or fidgety/restless  2  Suicidal thoughts 1  PHQ-9 Score 18     Review of Systems  HENT: Negative.   Eyes: Negative.   Respiratory: Negative.   Cardiovascular: Negative.   Gastrointestinal: Negative.   Endocrine: Negative.   Genitourinary: Negative.   Musculoskeletal: Positive for myalgias, back pain and arthralgias.  Skin: Negative.   Allergic/Immunologic: Negative.   Neurological: Positive for dizziness, tremors, weakness and numbness.       Tingling, trouble walking, spasms  Hematological: Negative.   Psychiatric/Behavioral: Positive for dysphoric mood. The patient is nervous/anxious.        Objective:   Physical Exam  Constitutional: She is oriented to person, place, and time. She appears well-developed and well-nourished.   HENT:  Head: Normocephalic and atraumatic.  Neck: Normal range of motion. Neck supple.  Cardiovascular: Normal rate and regular rhythm.   Pulmonary/Chest: Effort normal and breath sounds normal.  Musculoskeletal:  Normal Muscle Bulk and Muscle Testing Reveals: Upper Extremities: Full ROM and Muscle Strength 5/5 Lumbar Paraspinal Tenderness: L-3- L-5 Lower Extremities: Full ROM and Muscle Strength 5/5 Arises from chair with Ease using straight cane for support Narrow Based Gait  Neurological: She is alert and oriented to person, place, and time.  Skin: Skin is warm and dry.  Psychiatric: She has a normal mood and affect.  Nursing note and vitals reviewed.         Assessment & Plan:  1. History of chronic lumbar facet disease with spondylosis/DDD/ L4-5 spondylolisthesis.With L4-5 L5-S1 decompression stabilization: Refilled: Hydrocodone 10/325mg  one tablet every 6 hours as needed #120.  2. Depression with anxiety: Continue Xanax, and Effexor. F/U with Dr. Valentina Shaggy. 3. Cervical spondylosis: Stable at this time with no complaints. Continue Medication Regime.  4. Post Concussion Syndrome: Continue Ritalin 10 mg one tablet twice a day 0700 and noon #60.  20 minutes of face to face patient care time was spent during this visit. All questions were encouraged and answered.   F/U in 1 month

## 2015-05-27 ENCOUNTER — Encounter: Payer: Self-pay | Admitting: Endocrinology

## 2015-05-29 ENCOUNTER — Other Ambulatory Visit: Payer: Self-pay | Admitting: Physical Medicine & Rehabilitation

## 2015-06-04 ENCOUNTER — Other Ambulatory Visit: Payer: Self-pay | Admitting: Endocrinology

## 2015-06-10 ENCOUNTER — Telehealth: Payer: Self-pay | Admitting: *Deleted

## 2015-06-10 ENCOUNTER — Encounter: Payer: 59 | Attending: Physical Medicine and Rehabilitation | Admitting: Registered Nurse

## 2015-06-10 ENCOUNTER — Encounter: Payer: Self-pay | Admitting: Registered Nurse

## 2015-06-10 ENCOUNTER — Telehealth: Payer: Self-pay | Admitting: Interventional Cardiology

## 2015-06-10 VITALS — BP 99/57 | HR 71

## 2015-06-10 DIAGNOSIS — M7061 Trochanteric bursitis, right hip: Secondary | ICD-10-CM | POA: Diagnosis not present

## 2015-06-10 DIAGNOSIS — M461 Sacroiliitis, not elsewhere classified: Secondary | ICD-10-CM | POA: Diagnosis present

## 2015-06-10 DIAGNOSIS — F411 Generalized anxiety disorder: Secondary | ICD-10-CM

## 2015-06-10 DIAGNOSIS — F32A Depression, unspecified: Secondary | ICD-10-CM

## 2015-06-10 DIAGNOSIS — M47817 Spondylosis without myelopathy or radiculopathy, lumbosacral region: Secondary | ICD-10-CM | POA: Diagnosis not present

## 2015-06-10 DIAGNOSIS — G894 Chronic pain syndrome: Secondary | ICD-10-CM | POA: Diagnosis not present

## 2015-06-10 DIAGNOSIS — Z5181 Encounter for therapeutic drug level monitoring: Secondary | ICD-10-CM

## 2015-06-10 DIAGNOSIS — M25561 Pain in right knee: Secondary | ICD-10-CM

## 2015-06-10 DIAGNOSIS — Z79899 Other long term (current) drug therapy: Secondary | ICD-10-CM

## 2015-06-10 DIAGNOSIS — F329 Major depressive disorder, single episode, unspecified: Secondary | ICD-10-CM

## 2015-06-10 MED ORDER — METHYLPHENIDATE HCL 10 MG PO TABS: 10.0000 mg | ORAL_TABLET | Freq: Two times a day (BID) | ORAL | Status: DC

## 2015-06-10 MED ORDER — HYDROCODONE-ACETAMINOPHEN 10-325 MG PO TABS: 1.0000 | ORAL_TABLET | Freq: Four times a day (QID) | ORAL | Status: DC | PRN

## 2015-06-10 MED ORDER — AMITRIPTYLINE HCL 10 MG PO TABS: ORAL_TABLET | ORAL | Status: DC

## 2015-06-10 MED ORDER — ALPRAZOLAM 0.5 MG PO TABS: 0.5000 mg | ORAL_TABLET | Freq: Three times a day (TID) | ORAL | Status: DC | PRN

## 2015-06-10 NOTE — Progress Notes (Signed)
Subjective:    Patient ID: [name], female    DOB: [birth year], 59 y.o.   MRN: [id number]  HPI: [name] is a 59 year old female who returns for follow up for chronic pain and medication refill. She says her pain is located in her right hip and right knee. She rates her pain 8. Her current exercise regime is walking. Also states last week Thursday 06/06/2015 she was working in her parent's yard and had a black out spell. She states she's been having these spells frequently. Like twice a week. She didn't go to the ED. She was instructed to call her cardiologist. She called Dr. Daneen Schick the triage nurse will be calling her with an appointment. At this time she denies any dizziness. She was instructed to call office once she arrives home and to call the cardiologist and her PCP she verbalizes understanding.   Pain Inventory Average Pain 8 Pain Right Now 8 My pain is sharp, burning, dull, stabbing, tingling, aching and other  In the last 24 hours, has pain interfered with the following? General activity 10 Relation with others 9 Enjoyment of life 9 What TIME of day is your pain at its worst? same Sleep (in general) Fair  Pain is worse with: walking, sitting, standing, some activites and other Pain improves with: rest, heat/ice, pacing activities, medication and other Relief from Meds: 8  Mobility use a cane ability to climb steps?  yes do you drive?  yes  Function disabled: date disabled 5/15 I need assistance with the following:  meal prep, household duties and shopping  Neuro/Psych bladder control problems weakness numbness tremor tingling trouble walking spasms dizziness confusion depression anxiety loss of taste or smell  Prior Studies Any changes since last visit?  no  Physicians involved in your care Any changes since last visit?  no   Family History  Problem Relation Age of Onset  . Cancer Mother     cervical  . Cancer Father     colon    . Anxiety disorder Maternal Aunt   . Depression Maternal Aunt   . Anxiety disorder Maternal Uncle   . Depression Maternal Uncle     suicide attempt by gun shot wound to head. Survived  . Alcohol abuse Other   . Drug abuse Other    Social History   Social History  . Marital Status: Married    Spouse Name: N/A  . Number of Children: N/A  . Years of Education: N/A   Social History Main Topics  . Smoking status: Never Smoker   . Smokeless tobacco: Never Used  . Alcohol Use: No  . Drug Use: No  . Sexual Activity: Yes   Other Topics Concern  . None   Social History Narrative   Past Surgical History  Procedure Laterality Date  . Abdominal hysterectomy  1999  . Tonsillectomy  1981  . Tubal ligation  1997  . Bunions removed  1988  . Edg  12/17/2004  . Electrocardiogram  05/27/2007  . Skin cancer excision    . Cholecystectomy  04/25/2008  . C4 c5 cage insertion  06/28/2013  . L5 s1 rod insertion  11/07/2012   Past Medical History  Diagnosis Date  . ANXIETY 06/10/2007  . ASYMPTOMATIC POSTMENOPAUSAL STATUS 07/19/2009  . Cough 04/02/2009  . DYSLIPIDEMIA 06/28/2008  . GERD 06/10/2007  . 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 HYPERGLYCEMIA 06/28/2008  . HYPERTENSION 06/10/2007  . Irritable bowel syndrome 06/28/2008  . OSTEOARTHRITIS 06/10/2007  .  UPPER RESPIRATORY INFECTION 03/26/2009  . WEIGHT GAIN 01/21/2010  . Degenerative arthritis of spine     back and neck   BP 99/57 mmHg  Pulse 71  SpO2 97%  Opioid Risk Score:   Fall Risk Score:  `1  Depression screen PHQ 2/9  Depression screen [facility] 2/9 06/10/2015 01/02/2015  Decreased Interest 1 3  Down, Depressed, Hopeless 1 1  PHQ - 2 Score 2 4  Altered sleeping - 3  Tired, decreased energy - 2  Change in appetite - 2  Feeling bad or failure about yourself  - 1  Trouble concentrating - 3  Moving slowly or fidgety/restless - 2  Suicidal thoughts - 1  PHQ-9 Score - 18     Review of Systems  All other systems reviewed and are negative.       Objective:   Physical Exam  Constitutional: She is oriented to person, place, and time. She appears well-developed and well-nourished.  HENT:  Head: Normocephalic and atraumatic.  Neck: Normal range of motion. Neck supple.  Cardiovascular: Normal rate and regular rhythm.   Pulmonary/Chest: Effort normal and breath sounds normal.  Musculoskeletal:  Normal Muscle Bulk and Muscle Testing Reveals: Upper Extremities: Full ROM and Muscle Strength 5/5 Lumbar Paraspinal Tenderness: L-3- L-4 Lower Extremities: Full ROM and Muscle Strength 5/5 Arises from chair with ease Narrow Based Gait    Neurological: She is alert and oriented to person, place, and time.  Skin: Skin is warm and dry.  Psychiatric: She has a normal mood and affect.  Nursing note and vitals reviewed.         Assessment & Plan:  1. History of chronic lumbar facet disease with spondylosis/DDD/ L4-5 spondylolisthesis.With L4-5 L5-S1 decompression stabilization: Refilled: Hydrocodone 10/325mg  one tablet every 6 hours as needed #120.  2. Depression with anxiety: Continue Xanax, and Effexor. F/U with Dr. Valentina Shaggy. 3. Cervical spondylosis: Stable at this time with no complaints. Continue Medication Regime.  4. Post Concussion Syndrome: Continue Ritalin 10 mg one tablet twice a day 0700 and noon #60.  20 minutes of face to face patient care time was spent during this visit. All questions were encouraged and answered.   F/U in 1 month

## 2015-06-10 NOTE — Telephone Encounter (Signed)
Called patient, confirmed complaint symptoms.  Says that this summer, mostly when working in the yard or afterward, she has been having blackout type spells.  During these times her field of vision dims to almost blackness.  She says her heart rate elevates and she gets sob. Has history of anxiety.  Discussed hydration and drinking plenty of water during exertion.  She says she always drinks plenty of soft drinks.  I explained that the caffine in soft drinks cause additional excretion vs replenishing body fluids.  I asked her about whether she had talked to her PCP about this. She said she had and he advised her that when she comes in for her physical in Oct. he wants her to have a PFT.  I suggested that she contact his office and get this test moved up since that was his recommendation.  She isn't having any symptoms currently. She expressed understanding and agreed to this plan.  She has had negative workup in the past for similar complaints.

## 2015-06-10 NOTE — Telephone Encounter (Signed)
Pt c/o Shortness Of Breath: STAT if SOB developed within the last 24 hours or pt is noticeably SOB on the phone  1. Are you currently SOB (can you hear that pt is SOB on the phone)? No  2. How long have you been experiencing SOB? 70month  3. Are you SOB when sitting or when up moving around? both  4. Are you currently experiencing any other symptoms? Passing out and stated she will see total blackness.

## 2015-06-10 NOTE — Telephone Encounter (Signed)
[name] called back to let Zella Ball know that she called her cardiologist and they want her to see Dr  Loanne Drilling first and if he finds anything they will see her.  She sees Loanne Drilling 06/12/15 @ 3:30

## 2015-06-12 ENCOUNTER — Encounter: Payer: Self-pay | Admitting: Endocrinology

## 2015-06-12 ENCOUNTER — Emergency Department (HOSPITAL_COMMUNITY)
Admission: EM | Admit: 2015-06-12 | Discharge: 2015-06-12 | Disposition: A | Discharge status: Home / Self Care | Payer: 59 | Attending: Emergency Medicine | Admitting: Emergency Medicine

## 2015-06-12 ENCOUNTER — Encounter (HOSPITAL_COMMUNITY): Payer: Self-pay | Admitting: *Deleted

## 2015-06-12 ENCOUNTER — Ambulatory Visit (INDEPENDENT_AMBULATORY_CARE_PROVIDER_SITE_OTHER): Payer: 59 | Admitting: Endocrinology

## 2015-06-12 ENCOUNTER — Emergency Department (HOSPITAL_COMMUNITY): Payer: 59

## 2015-06-12 ENCOUNTER — Telehealth: Payer: Self-pay

## 2015-06-12 VITALS — BP 137/82 | HR 99 | Temp 98.1°F | Ht 61.0 in | Wt 162.0 lb

## 2015-06-12 DIAGNOSIS — I10 Essential (primary) hypertension: Secondary | ICD-10-CM | POA: Diagnosis not present

## 2015-06-12 DIAGNOSIS — M1990 Unspecified osteoarthritis, unspecified site: Secondary | ICD-10-CM | POA: Diagnosis not present

## 2015-06-12 DIAGNOSIS — R079 Chest pain, unspecified: Secondary | ICD-10-CM | POA: Diagnosis present

## 2015-06-12 DIAGNOSIS — Z8619 Personal history of other infectious and parasitic diseases: Secondary | ICD-10-CM | POA: Diagnosis not present

## 2015-06-12 DIAGNOSIS — F419 Anxiety disorder, unspecified: Secondary | ICD-10-CM | POA: Diagnosis not present

## 2015-06-12 DIAGNOSIS — Z79899 Other long term (current) drug therapy: Secondary | ICD-10-CM | POA: Insufficient documentation

## 2015-06-12 DIAGNOSIS — R0789 Other chest pain: Secondary | ICD-10-CM | POA: Diagnosis not present

## 2015-06-12 DIAGNOSIS — K219 Gastro-esophageal reflux disease without esophagitis: Secondary | ICD-10-CM | POA: Insufficient documentation

## 2015-06-12 DIAGNOSIS — Z7982 Long term (current) use of aspirin: Secondary | ICD-10-CM | POA: Insufficient documentation

## 2015-06-12 DIAGNOSIS — E785 Hyperlipidemia, unspecified: Secondary | ICD-10-CM | POA: Insufficient documentation

## 2015-06-12 DIAGNOSIS — K589 Irritable bowel syndrome without diarrhea: Secondary | ICD-10-CM | POA: Insufficient documentation

## 2015-06-12 DIAGNOSIS — E876 Hypokalemia: Secondary | ICD-10-CM | POA: Diagnosis not present

## 2015-06-12 LAB — CBC
HEMATOCRIT: 33.1 % — AB (ref 36.0–46.0)
HEMOGLOBIN: 10.8 g/dL — AB (ref 12.0–15.0)
MCH: 24.4 pg — ABNORMAL LOW (ref 26.0–34.0)
MCHC: 32.6 g/dL (ref 30.0–36.0)
MCV: 74.7 fL — ABNORMAL LOW (ref 78.0–100.0)
Platelets: 188 10*3/uL (ref 150–400)
RBC: 4.43 MIL/uL (ref 3.87–5.11)
RDW: 14.8 % (ref 11.5–15.5)
WBC: 5.4 10*3/uL (ref 4.0–10.5)

## 2015-06-12 LAB — COMPREHENSIVE METABOLIC PANEL
ALBUMIN: 3.8 g/dL (ref 3.5–5.0)
ALK PHOS: 73 U/L (ref 38–126)
ALT: 12 U/L — ABNORMAL LOW (ref 14–54)
ANION GAP: 9 (ref 5–15)
AST: 21 U/L (ref 15–41)
BILIRUBIN TOTAL: 0.4 mg/dL (ref 0.3–1.2)
BUN: 13 mg/dL (ref 6–20)
CALCIUM: 9.1 mg/dL (ref 8.9–10.3)
CO2: 28 mmol/L (ref 22–32)
Chloride: 100 mmol/L — ABNORMAL LOW (ref 101–111)
Creatinine, Ser: 0.82 mg/dL (ref 0.44–1.00)
GFR calc Af Amer: >60 mL/min (ref >60)
GFR calc non Af Amer: >60 mL/min (ref >60)
GLUCOSE: 102 mg/dL — AB (ref 65–99)
Potassium: 2.8 mmol/L — ABNORMAL LOW (ref 3.5–5.1)
SODIUM: 137 mmol/L (ref 135–145)
Total Protein: 6.3 g/dL — ABNORMAL LOW (ref 6.5–8.1)

## 2015-06-12 LAB — PROTIME-INR
INR: 1.04 (ref 0.00–1.49)
Prothrombin Time: 13.8 seconds (ref 11.6–15.2)

## 2015-06-12 LAB — TROPONIN I: Troponin I: <0.03 ng/mL (ref <0.031)

## 2015-06-12 LAB — MAGNESIUM: Magnesium: 2 mg/dL (ref 1.7–2.4)

## 2015-06-12 MED ORDER — ASPIRIN 81 MG PO CHEW: 324.0000 mg | CHEWABLE_TABLET | Freq: Once | ORAL | Status: DC

## 2015-06-12 MED ORDER — POTASSIUM CHLORIDE CRYS ER 20 MEQ PO TBCR
40.0000 meq | EXTENDED_RELEASE_TABLET | Freq: Once | ORAL | Status: AC
  Administered 2015-06-12: 40 meq via ORAL
  Filled 2015-06-12: qty 2

## 2015-06-12 MED ORDER — PROMETHAZINE HCL 25 MG/ML IJ SOLN
25.0000 mg | Freq: Once | INTRAMUSCULAR | Status: AC
  Administered 2015-06-12: 25 mg via INTRAVENOUS
  Filled 2015-06-12: qty 1

## 2015-06-12 MED ORDER — POTASSIUM CHLORIDE 10 MEQ/100ML IV SOLN
10.0000 meq | INTRAVENOUS | Status: AC
  Administered 2015-06-12 (×3): 10 meq via INTRAVENOUS
  Filled 2015-06-12 (×3): qty 100

## 2015-06-12 NOTE — Telephone Encounter (Signed)
Pt got B/P rechecked on f/u visit 06/12/2015

## 2015-06-12 NOTE — ED Provider Notes (Signed)
CSN: [id number]     Arrival date & time 06/12/15  1641 History   First MD Initiated Contact with Patient 06/12/15 1643     Chief Complaint  Patient presents with  . Chest Pain     (Consider location/radiation/quality/duration/timing/severity/associated sxs/prior Treatment) HPI Patient presents from her endocrinology office afterwards of chest pain. Patient acknowledges having short-term memory issues, but it seems as though within the past 2 hours, she had the sudden onset of left-sided chest pain, radiating to the left jaw. Symptoms improved with nitroglycerin. Patient has baseline dyspnea, this is unchanged. Currently, the patient has minimal chest discomfort, no change from baseline dyspnea, denies other pain or changes from baseline medical condition. No recent medication changes, diet changes. Patient has episodic syncope currently being evaluated by her physician.  Past Medical History  Diagnosis Date  . ANXIETY 06/10/2007  . ASYMPTOMATIC POSTMENOPAUSAL STATUS 07/19/2009  . Cough 04/02/2009  . DYSLIPIDEMIA 06/28/2008  . GERD 06/10/2007  . 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 HYPERGLYCEMIA 06/28/2008  . HYPERTENSION 06/10/2007  . Irritable bowel syndrome 06/28/2008  . OSTEOARTHRITIS 06/10/2007  . UPPER RESPIRATORY INFECTION 03/26/2009  . WEIGHT GAIN 01/21/2010  . Degenerative arthritis of spine     back and neck   Past Surgical History  Procedure Laterality Date  . Abdominal hysterectomy  1999  . Tonsillectomy  1981  . Tubal ligation  1997  . Bunions removed  1988  . Edg  12/17/2004  . Electrocardiogram  05/27/2007  . Skin cancer excision    . Cholecystectomy  04/25/2008  . C4 c5 cage insertion  06/28/2013  . L5 s1 rod insertion  11/07/2012   Family History  Problem Relation Age of Onset  . Cancer Mother     cervical  . Cancer Father     colon  . Anxiety disorder Maternal Aunt   . Depression Maternal Aunt   . Anxiety disorder Maternal Uncle   . Depression Maternal Uncle     suicide attempt  by gun shot wound to head. Survived  . Alcohol abuse Other   . Drug abuse Other    Social History  Substance Use Topics  . Smoking status: Never Smoker   . Smokeless tobacco: Never Used  . Alcohol Use: No   OB History    No data available     Review of Systems  Constitutional:       Per HPI, otherwise negative  HENT:       Per HPI, otherwise negative  Respiratory:       Per HPI, otherwise negative  Cardiovascular:       Per HPI, otherwise negative  Gastrointestinal: Negative for vomiting.  Endocrine:       Negative aside from HPI  Genitourinary:       Neg aside from HPI   Musculoskeletal:       Per HPI, otherwise negative  Skin: Negative.   Neurological: Negative for syncope.      Allergies  Gabapentin; Poison ivy extract; Poison oak extract; and Poison sumac extract  Home Medications   Prior to Admission medications   Medication Sig Start Date End Date Taking? Authorizing Provider  acyclovir (ZOVIRAX) 800 MG tablet TAKE 1 TABLET DAILY 12/05/14  Yes Renato Shin, MD  ALPRAZolam Duanne Moron) 0.5 MG tablet Take 1 tablet (0.5 mg total) by mouth 3 (three) times daily as needed for anxiety. 06/10/15  Yes Bayard Hugger, NP  amitriptyline (ELAVIL) 10 MG tablet 1- 2 tablets at Bedtime as needed Patient taking differently:  Take 10 mg by mouth at bedtime as needed for sleep.  06/10/15  Yes Bayard Hugger, NP  aspirin 81 MG chewable tablet Chew 324 mg by mouth once.   Yes Historical Provider, MD  atorvastatin (LIPITOR) 40 MG tablet TAKE 1 TABLET DAILY 06/04/15  Yes Renato Shin, MD  HYDROcodone-acetaminophen (NORCO) 10-325 MG per tablet Take 1 tablet by mouth every 6 (six) hours as needed. 06/10/15  Yes Bayard Hugger, NP  losartan-hydrochlorothiazide (HYZAAR) 100-12.5 MG per tablet Take 1 tablet by mouth daily. 07/30/14  Yes Renato Shin, MD  meclizine (ANTIVERT) 12.5 MG tablet TAKE 1 TABLET (12.5 MG TOTAL) BY MOUTH 3 (THREE) TIMES DAILY AS NEEDED. 10/23/13  Yes Meredith Staggers, MD   Melatonin 5 MG TABS Take 5 mg by mouth at bedtime as needed (for sleep).   Yes Historical Provider, MD  methylphenidate (RITALIN) 10 MG tablet Take 1 tablet (10 mg total) by mouth 2 (two) times daily with breakfast and lunch. [id number] 06/10/15  Yes Bayard Hugger, NP  nitroGLYCERIN (NITROSTAT) 0.4 MG SL tablet Place 0.4 mg under the tongue every 5 (five) minutes as needed for chest pain.   Yes Historical Provider, MD  omeprazole (PRILOSEC) 40 MG capsule TAKE 1 CAPSULE DAILY 04/05/15  Yes Renato Shin, MD  venlafaxine XR (EFFEXOR-XR) 150 MG 24 hr capsule Take 1 capsule (150 mg total) by mouth daily with breakfast. 01/29/15  Yes Bayard Hugger, NP  fluocinonide cream (LIDEX) 5.02 % Apply 1 application topically 4 (four) times daily. As needed for rash 04/19/15   Renato Shin, MD   BP 131/74 mmHg  Pulse 83  Resp 14  Ht 5\' 1"  (1.549 m)  Wt 162 lb (73.483 kg)  BMI 30.63 kg/m2  SpO2 95% Physical Exam  Constitutional: She is oriented to person, place, and time. She appears well-developed and well-nourished. No distress.  HENT:  Head: Normocephalic and atraumatic.  Eyes: Conjunctivae and EOM are normal.  Cardiovascular: Normal rate, regular rhythm and normal pulses.   Pulmonary/Chest: Effort normal and breath sounds normal. No stridor. No respiratory distress.  Abdominal: She exhibits no distension.  Musculoskeletal: She exhibits no edema.  Neurological: She is alert and oriented to person, place, and time. No cranial nerve deficit.  Skin: Skin is warm and dry.  Psychiatric: Her speech is delayed. She is withdrawn. Cognition and memory are impaired. She exhibits abnormal recent memory.  Nursing note and vitals reviewed.   ED Course  Procedures (including critical care time) Labs Review Labs Reviewed  CBC  COMPREHENSIVE METABOLIC PANEL  MAGNESIUM  PROTIME-INR  TROPONIN I    Imaging Review No results found. I have personally reviewed and evaluated these images and lab results as part  of my medical decision-making.   EKG Interpretation   Date/Time:  Wednesday June 12 2015 16:54:34 EDT Ventricular Rate:  83 PR Interval:  146 QRS Duration: 96 QT Interval:  387 QTC Calculation: 455 R Axis:   -9 Text Interpretation:  Sinus rhythm Probable LVH with secondary repol abnrm  Anterior Q waves, possibly due to LVH Baseline wander in lead(s) I III aVL  aVF V1 V2 Sinus rhythm Left axis deviation Left ventricular hypertrophy  Artifact Abnormal ekg Confirmed by Carmin Muskrat  MD [phone]) on  06/12/2015 5:15:13 PM     Following resuscitation with fluids, potassium, patient has no ongoing complaints. Second troponin is normal.  I again discussed all findings the patient and a family member. We will follow up with primary care for  repeat lab evaluation, additional evaluation of her episodic chest pain. MDM   Final diagnoses:  Atypical chest pain  Hypokalemia   Patient presents with concern of chest pain. Patient description of pain in the left chest, with rotation superiorly was concerning, but evaluation here, with negative troponins, nonischemic EKG, no evidence of arrhythmia on monitoring his reassuring for the low suspicion of ongoing coronary ischemia.  Patient did have evidence for hypokalemia, and this was repleted with IV and oral medication. 4 hours of monitoring, patient had no decompensation, no evidence for ongoing ischemia, infection, neurovascular compromise. Patient discharged in stable condition.   Carmin Muskrat, MD 06/13/15 [phone]

## 2015-06-12 NOTE — ED Notes (Signed)
Pt arrives from [facility] via [facility]. Pt states she has been having periodic moments of "everything going black" and syncopal episodes. Pt states she has been having centralized cp that radiates to left breast, and up to the left jaw. Pt states this is accompanied by sob. MD stated on scene that they believe the syncopal episodes are coming from sudden drops in bp not attributed to orthostatic hypotension. Pt received 324 mg asa and 1 nitro pta with relief.

## 2015-06-12 NOTE — Progress Notes (Signed)
   Subjective:    Patient ID: [name], female    DOB: 1955-10-20, 59 y.o.   MRN: [id number]  HPI Pt has chest pain.  911 is called.   Review of Systems     Objective:   Physical Exam        Assessment & Plan:

## 2015-06-12 NOTE — Discharge Instructions (Signed)
As discussed, it is important that you follow-up with primary care physician within the next week.  Tonight's evaluation has been largely reassuring, aside from demonstrating an abnormal potassium result.  Please be sure to speak with your physician for additional evaluation, and repeat blood work to ensure that her potassium levels have normalized.  Return here for concerning changes in your condition.

## 2015-06-12 NOTE — ED Notes (Signed)
Patient undressed, in gown, on monitor, continuous pulse oximetry and blood pressure cuff 

## 2015-06-12 NOTE — ED Notes (Signed)
Clarified with Dr. Vanita Panda, patient is to receive 3rd run of Kcl, then prepare for discharge.

## 2015-06-12 NOTE — ED Notes (Signed)
Called tray for patient.

## 2015-06-13 ENCOUNTER — Encounter: Payer: Self-pay | Admitting: Registered Nurse

## 2015-06-13 DIAGNOSIS — R079 Chest pain, unspecified: Secondary | ICD-10-CM | POA: Insufficient documentation

## 2015-06-25 ENCOUNTER — Ambulatory Visit (INDEPENDENT_AMBULATORY_CARE_PROVIDER_SITE_OTHER): Payer: 59 | Admitting: Endocrinology

## 2015-06-25 ENCOUNTER — Encounter: Payer: Self-pay | Admitting: Endocrinology

## 2015-06-25 VITALS — BP 104/70 | HR 95 | Temp 98.0°F | Ht 61.0 in | Wt 163.0 lb

## 2015-06-25 DIAGNOSIS — Z0000 Encounter for general adult medical examination without abnormal findings: Secondary | ICD-10-CM | POA: Insufficient documentation

## 2015-06-25 DIAGNOSIS — R079 Chest pain, unspecified: Secondary | ICD-10-CM

## 2015-06-25 DIAGNOSIS — D649 Anemia, unspecified: Secondary | ICD-10-CM | POA: Diagnosis not present

## 2015-06-25 DIAGNOSIS — R739 Hyperglycemia, unspecified: Secondary | ICD-10-CM | POA: Diagnosis not present

## 2015-06-25 DIAGNOSIS — E876 Hypokalemia: Secondary | ICD-10-CM

## 2015-06-25 DIAGNOSIS — Z0189 Encounter for other specified special examinations: Secondary | ICD-10-CM

## 2015-06-25 DIAGNOSIS — Z23 Encounter for immunization: Secondary | ICD-10-CM | POA: Diagnosis not present

## 2015-06-25 DIAGNOSIS — F411 Generalized anxiety disorder: Secondary | ICD-10-CM

## 2015-06-25 LAB — CBC WITH DIFFERENTIAL/PLATELET
BASOS PCT: 0.9 % (ref 0.0–3.0)
Basophils Absolute: 0 10*3/uL (ref 0.0–0.1)
EOS PCT: 0.4 % (ref 0.0–5.0)
Eosinophils Absolute: 0 10*3/uL (ref 0.0–0.7)
HCT: 35.3 % — ABNORMAL LOW (ref 36.0–46.0)
Hemoglobin: 11.5 g/dL — ABNORMAL LOW (ref 12.0–15.0)
LYMPHS ABS: 1.3 10*3/uL (ref 0.7–4.0)
Lymphocytes Relative: 28.8 % (ref 12.0–46.0)
MCHC: 32.5 g/dL (ref 30.0–36.0)
MCV: 72.9 fl — AB (ref 78.0–100.0)
MONO ABS: 0.2 10*3/uL (ref 0.1–1.0)
MONOS PCT: 5 % (ref 3.0–12.0)
NEUTROS ABS: 2.8 10*3/uL (ref 1.4–7.7)
NEUTROS PCT: 64.9 % (ref 43.0–77.0)
Platelets: 237 10*3/uL (ref 150.0–400.0)
RBC: 4.85 Mil/uL (ref 3.87–5.11)
RDW: 16.8 % — AB (ref 11.5–15.5)
WBC: 4.4 10*3/uL (ref 4.0–10.5)

## 2015-06-25 LAB — BASIC METABOLIC PANEL
BUN: 14 mg/dL (ref 6–23)
CALCIUM: 9.5 mg/dL (ref 8.4–10.5)
CO2: 32 mEq/L (ref 19–32)
Chloride: 99 mEq/L (ref 96–112)
Creatinine, Ser: 0.78 mg/dL (ref 0.40–1.20)
GFR: 80.58 mL/min (ref >60.00)
GLUCOSE: 98 mg/dL (ref 70–99)
POTASSIUM: 3.5 meq/L (ref 3.5–5.1)
SODIUM: 139 meq/L (ref 135–145)

## 2015-06-25 LAB — URINALYSIS, ROUTINE W REFLEX MICROSCOPIC
Bilirubin Urine: NEGATIVE
Ketones, ur: NEGATIVE
Leukocytes, UA: NEGATIVE
Nitrite: NEGATIVE
SPECIFIC GRAVITY, URINE: 1.015 (ref 1.000–1.030)
TOTAL PROTEIN, URINE-UPE24: NEGATIVE
URINE GLUCOSE: NEGATIVE
Urobilinogen, UA: 0.2 (ref 0.0–1.0)
WBC, UA: NONE SEEN (ref 0–?)
pH: 7 (ref 5.0–8.0)

## 2015-06-25 LAB — HEPATIC FUNCTION PANEL
ALBUMIN: 4.6 g/dL (ref 3.5–5.2)
ALK PHOS: 80 U/L (ref 39–117)
ALT: 13 U/L (ref 0–35)
AST: 19 U/L (ref 0–37)
Bilirubin, Direct: 0 mg/dL (ref 0.0–0.3)
TOTAL PROTEIN: 7.2 g/dL (ref 6.0–8.3)
Total Bilirubin: 0.4 mg/dL (ref 0.2–1.2)

## 2015-06-25 LAB — LIPID PANEL
CHOL/HDL RATIO: 4
CHOLESTEROL: 179 mg/dL (ref 0–200)
HDL: 51 mg/dL (ref >39.00)
NonHDL: 128.19
Triglycerides: 257 mg/dL — ABNORMAL HIGH (ref 0.0–149.0)
VLDL: 51.4 mg/dL — AB (ref 0.0–40.0)

## 2015-06-25 LAB — IBC PANEL
Iron: 36 ug/dL — ABNORMAL LOW (ref 42–145)
Saturation Ratios: 8.2 % — ABNORMAL LOW (ref 20.0–50.0)
Transferrin: 312 mg/dL (ref 212.0–360.0)

## 2015-06-25 LAB — HEMOGLOBIN A1C: HEMOGLOBIN A1C: 6 % (ref 4.6–6.5)

## 2015-06-25 LAB — TSH: TSH: 3.02 u[IU]/mL (ref 0.35–4.50)

## 2015-06-25 LAB — LDL CHOLESTEROL, DIRECT: Direct LDL: 84 mg/dL

## 2015-06-25 NOTE — Progress Notes (Signed)
Subjective:    Patient ID: [name], female    DOB: 03/17/1956, 59 y.o.   MRN: [id number]  HPI The state of at least three ongoing medical problems is addressed today, with interval history of each noted here: dyslipidemia: she has weight gain HTN: she denies leg cramps. Anemia: she denies BRBPR Past Medical History  Diagnosis Date  . ANXIETY 06/10/2007  . ASYMPTOMATIC POSTMENOPAUSAL STATUS 07/19/2009  . Cough 04/02/2009  . DYSLIPIDEMIA 06/28/2008  . GERD 06/10/2007  . 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 HYPERGLYCEMIA 06/28/2008  . HYPERTENSION 06/10/2007  . Irritable bowel syndrome 06/28/2008  . OSTEOARTHRITIS 06/10/2007  . UPPER RESPIRATORY INFECTION 03/26/2009  . WEIGHT GAIN 01/21/2010  . Degenerative arthritis of spine     back and neck    Past Surgical History  Procedure Laterality Date  . Abdominal hysterectomy  1999  . Tonsillectomy  1981  . Tubal ligation  1997  . Bunions removed  1988  . Edg  12/17/2004  . Electrocardiogram  05/27/2007  . Skin cancer excision    . Cholecystectomy  04/25/2008  . C4 c5 cage insertion  06/28/2013  . L5 s1 rod insertion  11/07/2012    Social History   Social History  . Marital Status: Married    Spouse Name: N/A  . Number of Children: N/A  . Years of Education: N/A   Occupational History  . Not on file.   Social History Main Topics  . Smoking status: Never Smoker   . Smokeless tobacco: Never Used  . Alcohol Use: No  . Drug Use: No  . Sexual Activity: Yes   Other Topics Concern  . Not on file   Social History Narrative    Current Outpatient Prescriptions on File Prior to Visit  Medication Sig Dispense Refill  . acyclovir (ZOVIRAX) 800 MG tablet TAKE 1 TABLET DAILY 90 tablet 3  . ALPRAZolam (XANAX) 0.5 MG tablet Take 1 tablet (0.5 mg total) by mouth 3 (three) times daily as needed for anxiety. 90 tablet 2  . amitriptyline (ELAVIL) 10 MG tablet 1- 2 tablets at Bedtime as needed (Patient taking differently: Take 10 mg by mouth at bedtime as  needed for sleep. ) 60 tablet 0  . atorvastatin (LIPITOR) 40 MG tablet TAKE 1 TABLET DAILY 90 tablet 1  . fluocinonide cream (LIDEX) 1.51 % Apply 1 application topically 4 (four) times daily. As needed for rash 60 g 1  . HYDROcodone-acetaminophen (NORCO) 10-325 MG per tablet Take 1 tablet by mouth every 6 (six) hours as needed. 120 tablet 0  . losartan-hydrochlorothiazide (HYZAAR) 100-12.5 MG per tablet Take 1 tablet by mouth daily. 90 tablet 11  . meclizine (ANTIVERT) 12.5 MG tablet TAKE 1 TABLET (12.5 MG TOTAL) BY MOUTH 3 (THREE) TIMES DAILY AS NEEDED. 45 tablet 2  . Melatonin 5 MG TABS Take 5 mg by mouth at bedtime as needed (for sleep).    . methylphenidate (RITALIN) 10 MG tablet Take 1 tablet (10 mg total) by mouth 2 (two) times daily with breakfast and lunch. 0700,1200 60 tablet 0  . omeprazole (PRILOSEC) 40 MG capsule TAKE 1 CAPSULE DAILY 90 capsule 0  . venlafaxine XR (EFFEXOR-XR) 150 MG 24 hr capsule Take 1 capsule (150 mg total) by mouth daily with breakfast. 90 capsule 3  . aspirin 81 MG chewable tablet Chew 324 mg by mouth once.     No current facility-administered medications on file prior to visit.    Allergies  Allergen Reactions  . Gabapentin Other (  See Comments)    Bad headaches  . Poison Ivy Extract [Extract Of Poison Ivy] Rash  . Poison Oak Extract [Extract Of Poison Oak] Rash  . Poison Sumac Extract Rash    Family History  Problem Relation Age of Onset  . Cancer Mother     cervical  . Cancer Father     colon  . Anxiety disorder Maternal Aunt   . Depression Maternal Aunt   . Anxiety disorder Maternal Uncle   . Depression Maternal Uncle     suicide attempt by gun shot wound to head. Survived  . Alcohol abuse Other   . Drug abuse Other     BP 104/70 mmHg  Pulse 95  Temp(Src) 98 F (36.7 C) (Oral)  Ht 5\' 1"  (1.549 m)  Wt 163 lb (73.936 kg)  BMI 30.81 kg/m2  SpO2 98%   Review of Systems Denies hematuria.  Chest pain is intermittent.     Objective:    Physical Exam VITAL SIGNS:  See vs page GENERAL: no distress LUNGS:  Clear to auscultation HEART:  Regular rate and rhythm without murmurs noted. Normal S1,S2.   Ext: no edema   D-dimer is normal  Lab Results  Component Value Date   WBC 4.4 06/25/2015   HGB 11.5* 06/25/2015   HCT 35.3* 06/25/2015   MCV 72.9* 06/25/2015   PLT 237.0 06/25/2015   Lab Results  Component Value Date   CHOL 179 06/25/2015   HDL 51.00 06/25/2015   LDLCALC 96 08/15/2009   LDLDIRECT 84.0 06/25/2015   TRIG 257.0* 06/25/2015   CHOLHDL 4 06/25/2015      Assessment & Plan:  Chest pain, prob from chest wall.  HTN: with ? Of situational component Anemia: mild: take iron, 1 pill twice a day.  Dyslipidemia: well-controlled.  Please continue the same medication.  Patient is advised the following: Patient Instructions  blood tests are requested for you today.  We'll let you know about the results. Please continue the same medication for blood pressure.  We'll recheck it next time.   here are some tests for blood in the bowels.  please follow the instructions, and return to the lab.   Please come back for a regular physical appointment as scheduled.

## 2015-06-25 NOTE — Patient Instructions (Addendum)
blood tests are requested for you today.  We'll let you know about the results. Please continue the same medication for blood pressure.  We'll recheck it next time.   here are some tests for blood in the bowels.  please follow the instructions, and return to the lab.   Please come back for a regular physical appointment as scheduled.

## 2015-06-26 LAB — D-DIMER, QUANTITATIVE: D-Dimer, Quant: 0.47 ug/mL-FEU (ref 0.00–0.48)

## 2015-07-02 ENCOUNTER — Other Ambulatory Visit: Payer: Self-pay | Admitting: Endocrinology

## 2015-07-06 ENCOUNTER — Other Ambulatory Visit: Payer: Self-pay | Admitting: Registered Nurse

## 2015-07-10 ENCOUNTER — Encounter: Payer: Self-pay | Admitting: Registered Nurse

## 2015-07-10 ENCOUNTER — Encounter: Payer: 59 | Attending: Physical Medicine and Rehabilitation | Admitting: Registered Nurse

## 2015-07-10 VITALS — BP 126/76 | HR 103 | Resp 14

## 2015-07-10 DIAGNOSIS — F411 Generalized anxiety disorder: Secondary | ICD-10-CM

## 2015-07-10 DIAGNOSIS — Z5181 Encounter for therapeutic drug level monitoring: Secondary | ICD-10-CM

## 2015-07-10 DIAGNOSIS — M47817 Spondylosis without myelopathy or radiculopathy, lumbosacral region: Secondary | ICD-10-CM | POA: Diagnosis not present

## 2015-07-10 DIAGNOSIS — G894 Chronic pain syndrome: Secondary | ICD-10-CM | POA: Diagnosis not present

## 2015-07-10 DIAGNOSIS — F329 Major depressive disorder, single episode, unspecified: Secondary | ICD-10-CM | POA: Diagnosis not present

## 2015-07-10 DIAGNOSIS — F32A Depression, unspecified: Secondary | ICD-10-CM

## 2015-07-10 DIAGNOSIS — Z79899 Other long term (current) drug therapy: Secondary | ICD-10-CM

## 2015-07-10 DIAGNOSIS — M461 Sacroiliitis, not elsewhere classified: Secondary | ICD-10-CM | POA: Insufficient documentation

## 2015-07-10 MED ORDER — HYDROCODONE-ACETAMINOPHEN 10-325 MG PO TABS: 1.0000 | ORAL_TABLET | Freq: Four times a day (QID) | ORAL | Status: DC | PRN

## 2015-07-10 MED ORDER — METHYLPHENIDATE HCL 10 MG PO TABS: 10.0000 mg | ORAL_TABLET | Freq: Two times a day (BID) | ORAL | Status: DC

## 2015-07-10 NOTE — Progress Notes (Signed)
Subjective:    Patient ID: [name], female    DOB: 1956-04-02, 59 y.o.   MRN: [id number]  HPI: [name] is a 59 year old female who returns for follow up for chronic pain and medication refill. She says her pain is located in her lower back and sacral tenderness.  She rates her pain 9. Her current exercise regime is walking.  On 06/12/2015 she was in Dr. Loanne Drilling office began having chest pain she was sent to [facility] for evaluation. She denies any chest pain at this time. She was noted to be hyperkalemic K+ 2.8.  Pain Inventory Average Pain 8 Pain Right Now 9 My pain is constant, sharp, burning, dull, stabbing, tingling and aching  In the last 24 hours, has pain interfered with the following? General activity 10 Relation with others 10 Enjoyment of life 10 What TIME of day is your pain at its worst? all Sleep (in general) Fair  Pain is worse with: walking, bending, sitting, inactivity, standing, unsure and some activites Pain improves with: rest, heat/ice, pacing activities and medication Relief from Meds: 7  Mobility walk with assistance use a cane use a walker how many minutes can you walk? depends ability to climb steps?  yes do you drive?  yes  Function not employed: date last employed 2010 disabled: date disabled 2015 I need assistance with the following:  meal prep, household duties and shopping  Neuro/Psych weakness numbness tremor tingling trouble walking spasms dizziness confusion depression anxiety loss of taste or smell  Prior Studies Any changes since last visit?  no  Physicians involved in your care Any changes since last visit?  no   Family History  Problem Relation Age of Onset  . Cancer Mother     cervical  . Cancer Father     colon  . Anxiety disorder Maternal Aunt   . Depression Maternal Aunt   . Anxiety disorder Maternal Uncle   . Depression Maternal Uncle     suicide attempt by gun shot wound to head. Survived    . Alcohol abuse Other   . Drug abuse Other    Social History   Social History  . Marital Status: Married    Spouse Name: N/A  . Number of Children: N/A  . Years of Education: N/A   Social History Main Topics  . Smoking status: Never Smoker   . Smokeless tobacco: Never Used  . Alcohol Use: No  . Drug Use: No  . Sexual Activity: Yes   Other Topics Concern  . None   Social History Narrative   Past Surgical History  Procedure Laterality Date  . Abdominal hysterectomy  1999  . Tonsillectomy  1981  . Tubal ligation  1997  . Bunions removed  1988  . Edg  12/17/2004  . Electrocardiogram  05/27/2007  . Skin cancer excision    . Cholecystectomy  04/25/2008  . C4 c5 cage insertion  06/28/2013  . L5 s1 rod insertion  11/07/2012   Past Medical History  Diagnosis Date  . ANXIETY 06/10/2007  . ASYMPTOMATIC POSTMENOPAUSAL STATUS 07/19/2009  . Cough 04/02/2009  . DYSLIPIDEMIA 06/28/2008  . GERD 06/10/2007  . 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 HYPERGLYCEMIA 06/28/2008  . HYPERTENSION 06/10/2007  . Irritable bowel syndrome 06/28/2008  . OSTEOARTHRITIS 06/10/2007  . UPPER RESPIRATORY INFECTION 03/26/2009  . WEIGHT GAIN 01/21/2010  . Degenerative arthritis of spine     back and neck   BP 126/76 mmHg  Pulse 103  Resp  14  SpO2 96%  Opioid Risk Score:   Fall Risk Score:  `1  Depression screen PHQ 2/9  Depression screen [facility] 2/9 06/10/2015 01/02/2015  Decreased Interest 1 3  Down, Depressed, Hopeless 1 1  PHQ - 2 Score 2 4  Altered sleeping - 3  Tired, decreased energy - 2  Change in appetite - 2  Feeling bad or failure about yourself  - 1  Trouble concentrating - 3  Moving slowly or fidgety/restless - 2  Suicidal thoughts - 1  PHQ-9 Score - 18     Review of Systems  Constitutional:       Loss of taste/smell Weight gain Loss of appetite  Respiratory: Positive for cough and shortness of breath.   Cardiovascular: Positive for leg swelling.  Gastrointestinal: Positive for nausea and abdominal  pain.  Skin: Positive for rash.  Neurological: Positive for dizziness, tremors, weakness and numbness.       Tingling Spasms   Hematological: Bruises/bleeds easily.  Psychiatric/Behavioral: Positive for confusion and dysphoric mood. The patient is nervous/anxious.   All other systems reviewed and are negative.      Objective:   Physical Exam  Constitutional: She is oriented to person, place, and time. She appears well-developed and well-nourished.  HENT:  Head: Normocephalic and atraumatic.  Neck: Normal range of motion. Neck supple.  Cardiovascular: Normal rate and regular rhythm.   Pulmonary/Chest: Effort normal and breath sounds normal.  Musculoskeletal:  Normal Muscle Bulk and Muscle Testing Reveals: Upper Extremities: Full ROM and Muscle Strength 5/5 Lumbar Paraspinal Tenderness: L-3- L-4 Sacral Tenderness Lower Extremities: Full ROM and Muscle Strength 5/5 Arises from chair slowly using straight cane for support. Antalgic gait  Neurological: She is alert and oriented to person, place, and time.  Skin: Skin is warm and dry.  Psychiatric: She has a normal mood and affect.  Nursing note and vitals reviewed.         Assessment & Plan:  1. History of chronic lumbar facet disease with spondylosis/DDD/ L4-5 spondylolisthesis.With L4-5 L5-S1 decompression stabilization: Refilled: Hydrocodone 10/325mg  one tablet every 6 hours as needed #120.  2. Depression with anxiety: Continue Xanax, and Effexor. F/U with Dr. Valentina Shaggy. 3. Cervical spondylosis: Stable at this time with no complaints. Continue Medication Regime.  4. Post Concussion Syndrome: Continue Ritalin 10 mg one tablet twice a day 0700 and noon #60.  20 minutes of face to face patient care time was spent during this visit. All questions were encouraged and answered.   F/U in 1 month

## 2015-07-12 ENCOUNTER — Ambulatory Visit: Payer: Self-pay | Admitting: Dietician

## 2015-07-25 ENCOUNTER — Other Ambulatory Visit: Payer: 59

## 2015-07-25 DIAGNOSIS — K588 Other irritable bowel syndrome: Secondary | ICD-10-CM

## 2015-07-26 ENCOUNTER — Other Ambulatory Visit: Payer: Self-pay

## 2015-07-26 DIAGNOSIS — K588 Other irritable bowel syndrome: Secondary | ICD-10-CM

## 2015-07-26 LAB — HEMOCCULT SLIDES (X 3 CARDS)
Fecal Occult Blood: NEGATIVE
OCCULT 1: NEGATIVE
OCCULT 2: NEGATIVE
OCCULT 3: NEGATIVE
OCCULT 4: NEGATIVE
OCCULT 5: NEGATIVE

## 2015-08-01 ENCOUNTER — Ambulatory Visit (INDEPENDENT_AMBULATORY_CARE_PROVIDER_SITE_OTHER): Payer: 59 | Admitting: Endocrinology

## 2015-08-01 VITALS — BP 138/82 | HR 81 | Temp 98.2°F | Resp 16 | Ht 61.0 in | Wt 164.0 lb

## 2015-08-01 DIAGNOSIS — D509 Iron deficiency anemia, unspecified: Secondary | ICD-10-CM

## 2015-08-01 DIAGNOSIS — Z0000 Encounter for general adult medical examination without abnormal findings: Secondary | ICD-10-CM

## 2015-08-01 DIAGNOSIS — H8110 Benign paroxysmal vertigo, unspecified ear: Secondary | ICD-10-CM

## 2015-08-01 LAB — CBC WITH DIFFERENTIAL/PLATELET
BASOS PCT: 0.7 % (ref 0.0–3.0)
Basophils Absolute: 0 10*3/uL (ref 0.0–0.1)
EOS ABS: 0.1 10*3/uL (ref 0.0–0.7)
EOS PCT: 1.2 % (ref 0.0–5.0)
HEMATOCRIT: 37.3 % (ref 36.0–46.0)
Hemoglobin: 12 g/dL (ref 12.0–15.0)
LYMPHS PCT: 30.8 % (ref 12.0–46.0)
Lymphs Abs: 1.4 10*3/uL (ref 0.7–4.0)
MCHC: 32.1 g/dL (ref 30.0–36.0)
MCV: 77.1 fl — ABNORMAL LOW (ref 78.0–100.0)
MONO ABS: 0.3 10*3/uL (ref 0.1–1.0)
Monocytes Relative: 7.3 % (ref 3.0–12.0)
NEUTROS PCT: 60 % (ref 43.0–77.0)
Neutro Abs: 2.7 10*3/uL (ref 1.4–7.7)
PLATELETS: 200 10*3/uL (ref 150.0–400.0)
RBC: 4.83 Mil/uL (ref 3.87–5.11)
RDW: 19.6 % — AB (ref 11.5–15.5)
WBC: 4.5 10*3/uL (ref 4.0–10.5)

## 2015-08-01 LAB — IBC PANEL
Iron: 241 ug/dL — ABNORMAL HIGH (ref 42–145)
Saturation Ratios: 66.5 % — ABNORMAL HIGH (ref 20.0–50.0)
TRANSFERRIN: 259 mg/dL (ref 212.0–360.0)

## 2015-08-01 NOTE — Patient Instructions (Addendum)
Please see Dr Oletta Lamas.  you will receive a phone call, about a day and time for an appointment.  Please also see a neurology specialist.  you will receive a phone call, about a day and time for an appointment.  please consider these measures for your health:  minimize alcohol.  do not use tobacco products.  have a colonoscopy at least every 10 years from age 58.  Women should have an annual mammogram from age 60.  keep firearms safely stored.  always use seat belts.  have working smoke alarms in your home.  see an eye doctor and dentist regularly.  never drive under the influence of alcohol or drugs (including prescription drugs).  those with fair skin should take precautions against the sun.  it is critically important to prevent falling down (keep floor areas well-lit, dry, and free of loose objects.  If you have a cane, walker, or wheelchair, you should use it, even for short trips around the house.  Also, try not to rush).

## 2015-08-01 NOTE — Progress Notes (Signed)
Subjective:    Patient ID: [name], female    DOB: Dec 27, 1955, 58 y.o.   MRN: [id number]  HPI Pt is here for regular wellness examination, and is feeling pretty well in general, and says chronic med probs are stable, except as noted below. Past Medical History  Diagnosis Date  . ANXIETY 06/10/2007  . ASYMPTOMATIC POSTMENOPAUSAL STATUS 07/19/2009  . Cough 04/02/2009  . DYSLIPIDEMIA 06/28/2008  . GERD 06/10/2007  . 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 HYPERGLYCEMIA 06/28/2008  . HYPERTENSION 06/10/2007  . Irritable bowel syndrome 06/28/2008  . OSTEOARTHRITIS 06/10/2007  . UPPER RESPIRATORY INFECTION 03/26/2009  . WEIGHT GAIN 01/21/2010  . Degenerative arthritis of spine     back and neck    Past Surgical History  Procedure Laterality Date  . Abdominal hysterectomy  1999  . Tonsillectomy  1981  . Tubal ligation  1997  . Bunions removed  1988  . Edg  12/17/2004  . Electrocardiogram  05/27/2007  . Skin cancer excision    . Cholecystectomy  04/25/2008  . C4 c5 cage insertion  06/28/2013  . L5 s1 rod insertion  11/07/2012    Social History   Social History  . Marital Status: Married    Spouse Name: N/A  . Number of Children: N/A  . Years of Education: N/A   Occupational History  . Not on file.   Social History Main Topics  . Smoking status: Never Smoker   . Smokeless tobacco: Never Used  . Alcohol Use: No  . Drug Use: No  . Sexual Activity: Yes   Other Topics Concern  . Not on file   Social History Narrative    Current Outpatient Prescriptions on File Prior to Visit  Medication Sig Dispense Refill  . acyclovir (ZOVIRAX) 800 MG tablet TAKE 1 TABLET DAILY 90 tablet 3  . ALPRAZolam (XANAX) 0.5 MG tablet Take 1 tablet (0.5 mg total) by mouth 3 (three) times daily as needed for anxiety. 90 tablet 2  . amitriptyline (ELAVIL) 10 MG tablet TAKE 1 TO 2 TABLETS BY MOUTH AT BEDTIME AS NEEDED 60 tablet 2  . aspirin 81 MG chewable tablet Chew 324 mg by mouth once.    Marland Kitchen atorvastatin (LIPITOR) 40 MG  tablet TAKE 1 TABLET DAILY 90 tablet 1  . ferrous sulfate 325 (65 FE) MG tablet Take 325 mg by mouth 2 (two) times daily with a meal.    . HYDROcodone-acetaminophen (NORCO) 10-325 MG tablet Take 1 tablet by mouth every 6 (six) hours as needed. 120 tablet 0  . losartan-hydrochlorothiazide (HYZAAR) 100-12.5 MG per tablet Take 1 tablet by mouth daily. 90 tablet 11  . meclizine (ANTIVERT) 12.5 MG tablet TAKE 1 TABLET (12.5 MG TOTAL) BY MOUTH 3 (THREE) TIMES DAILY AS NEEDED. 45 tablet 2  . Melatonin 5 MG TABS Take 5 mg by mouth at bedtime as needed (for sleep).    . methylphenidate (RITALIN) 10 MG tablet Take 1 tablet (10 mg total) by mouth 2 (two) times daily with breakfast and lunch. 0700,1200 60 tablet 0  . omeprazole (PRILOSEC) 40 MG capsule TAKE 1 CAPSULE DAILY 90 capsule 2  . venlafaxine XR (EFFEXOR-XR) 150 MG 24 hr capsule Take 1 capsule (150 mg total) by mouth daily with breakfast. 90 capsule 3  . fluocinonide cream (LIDEX) 5.00 % Apply 1 application topically 4 (four) times daily. As needed for rash (Patient not taking: Reported on 08/01/2015) 60 g 1   No current facility-administered medications on file prior to visit.  Allergies  Allergen Reactions  . Gabapentin Other (See Comments)    Bad headaches  . Poison Ivy Extract [Extract Of Poison Ivy] Rash  . Poison Oak Extract [Extract Of Poison Oak] Rash  . Poison Sumac Extract Rash    Family History  Problem Relation Age of Onset  . Cancer Mother     cervical  . Cancer Father     colon  . Anxiety disorder Maternal Aunt   . Depression Maternal Aunt   . Anxiety disorder Maternal Uncle   . Depression Maternal Uncle     suicide attempt by gun shot wound to head. Survived  . Alcohol abuse Other   . Drug abuse Other     BP 138/82 mmHg  Pulse 81  Temp(Src) 98.2 F (36.8 C) (Oral)  Resp 16  Ht 5\' 1"  (1.549 m)  Wt 164 lb (74.39 kg)  BMI 31.00 kg/m2  SpO2 98%  Review of Systems  Constitutional: Negative for fever and  unexpected weight change.  HENT: Negative for hearing loss.   Eyes: Negative for visual disturbance.  Respiratory: Negative for shortness of breath.   Cardiovascular: Negative for leg swelling.  Gastrointestinal: Negative for blood in stool.  Endocrine: Negative for cold intolerance.  Genitourinary: Negative for hematuria.  Musculoskeletal: Negative for gait problem.  Skin: Negative for rash.  Allergic/Immunologic: Negative for environmental allergies.  Neurological: Negative for syncope.  Hematological: Bruises/bleeds easily.  Psychiatric/Behavioral: Negative for suicidal ideas.       Objective:   Physical Exam VS: see vs page GEN: no distress HEAD: head: no deformity eyes: no periorbital swelling, no proptosis external nose and ears are normal mouth: no lesion seen NECK: supple, thyroid is not enlarged CHEST WALL: no deformity LUNGS:  Clear to auscultation.  BREASTS: sees gyn.   CV: reg rate and rhythm, no murmur. GENITALIA/RECTAL: sees gyn MUSCULOSKELETAL: muscle bulk and strength are grossly normal.  no obvious joint swelling.  gait is steady with a cane.  EXTEMITIES: no deformity.  no ulcer on the feet.  feet are of normal color and temp.  no edema PULSES: dorsalis pedis intact bilat.  no carotid bruit NEURO:  cn 2-12 grossly intact.   readily moves all 4's.  sensation is intact to touch on the feet SKIN:  Normal texture and temperature.  No rash or suspicious lesion is visible.   NODES:  None palpable at the neck PSYCH: alert, well-oriented.  Does not appear anxious nor depressed.      Assessment & Plan:  Wellness visit today, with problems stable, except as noted.   SEPARATE EVALUATION FOLLOWS--EACH PROBLEM HERE IS NEW, NOT RESPONDING TO TREATMENT, OR POSES SIGNIFICANT RISK TO THE PATIENT'S HEALTH: HISTORY OF THE PRESENT ILLNESS: Pt states few years of intermittent mild pain at the epigastric area, and assoc chest pain PAST MEDICAL HISTORY reviewed and up to  date today REVIEW OF SYSTEMS: She also has heartburn.  Vertigo persists PHYSICAL EXAMINATION: VITAL SIGNS:  See vs page GENERAL: no distress ABDOMEN: abdomen is soft, nontender.  no hepatosplenomegaly.  not distended.  no hernia LAB/XRAY RESULTS: Lab Results  Component Value Date   WBC 4.5 08/01/2015   HGB 12.0 08/01/2015   HCT 37.3 08/01/2015   MCV 77.1* 08/01/2015   PLT 200.0 08/01/2015  IMPRESSION: Vertigo, persistent Heartburn, new fe-deficiency anemia.  Improved on fe supplementation.  She should consider EGD to eval this. PLAN:  Please see Dr Oletta Lamas.  you will receive a phone call, about a day and time for  an appointment.  Please also see a neurology specialist.  you will receive a phone call, about a day and time for an appointment.  D/c fe tabs

## 2015-08-01 NOTE — Progress Notes (Signed)
Pre visit review using our clinic review tool, if applicable. No additional management support is needed unless otherwise documented below in the visit note. 

## 2015-08-01 NOTE — Progress Notes (Signed)
we discussed code status.  pt requests full code, but would not want to be started or maintained on artificial life-support measures if there was not a reasonable chance of recovery 

## 2015-08-08 ENCOUNTER — Encounter: Payer: Self-pay | Admitting: Endocrinology

## 2015-08-09 ENCOUNTER — Encounter: Payer: 59 | Attending: Physical Medicine and Rehabilitation | Admitting: Registered Nurse

## 2015-08-09 ENCOUNTER — Other Ambulatory Visit: Payer: Self-pay

## 2015-08-09 ENCOUNTER — Encounter: Payer: Self-pay | Admitting: Registered Nurse

## 2015-08-09 ENCOUNTER — Other Ambulatory Visit: Payer: Self-pay | Admitting: Registered Nurse

## 2015-08-09 VITALS — BP 140/79 | HR 99

## 2015-08-09 DIAGNOSIS — M5416 Radiculopathy, lumbar region: Secondary | ICD-10-CM | POA: Diagnosis not present

## 2015-08-09 DIAGNOSIS — F411 Generalized anxiety disorder: Secondary | ICD-10-CM | POA: Diagnosis not present

## 2015-08-09 DIAGNOSIS — M47817 Spondylosis without myelopathy or radiculopathy, lumbosacral region: Secondary | ICD-10-CM | POA: Diagnosis not present

## 2015-08-09 DIAGNOSIS — F329 Major depressive disorder, single episode, unspecified: Secondary | ICD-10-CM | POA: Diagnosis not present

## 2015-08-09 DIAGNOSIS — M461 Sacroiliitis, not elsewhere classified: Secondary | ICD-10-CM | POA: Insufficient documentation

## 2015-08-09 DIAGNOSIS — F32A Depression, unspecified: Secondary | ICD-10-CM

## 2015-08-09 DIAGNOSIS — G894 Chronic pain syndrome: Secondary | ICD-10-CM

## 2015-08-09 DIAGNOSIS — Z5181 Encounter for therapeutic drug level monitoring: Secondary | ICD-10-CM

## 2015-08-09 DIAGNOSIS — Z1231 Encounter for screening mammogram for malignant neoplasm of breast: Secondary | ICD-10-CM

## 2015-08-09 DIAGNOSIS — Z79899 Other long term (current) drug therapy: Secondary | ICD-10-CM

## 2015-08-09 MED ORDER — METHYLPHENIDATE HCL 10 MG PO TABS: 10.0000 mg | ORAL_TABLET | Freq: Two times a day (BID) | ORAL | Status: DC

## 2015-08-09 MED ORDER — HYDROCODONE-ACETAMINOPHEN 10-325 MG PO TABS: 1.0000 | ORAL_TABLET | Freq: Four times a day (QID) | ORAL | Status: DC | PRN

## 2015-08-09 NOTE — Progress Notes (Signed)
Subjective:    Patient ID: [name], female    DOB: 02-23-1956, 59 y.o.   MRN: [id number]  HPI: [name] is a 58 year old female who returns for follow up for chronic pain and medication refill. She says her pain is located in her lower back radiating into left leg posteriorly and sacral tenderness. She rates her pain 9. Her current exercise regime is walking. Also states she has poison Ivy and Dr. Loanne Drilling following.  Pain Inventory Average Pain 8 Pain Right Now 9 My pain is sharp, burning, dull, stabbing, tingling and aching  In the last 24 hours, has pain interfered with the following? General activity 10 Relation with others 10 Enjoyment of life 10 What TIME of day is your pain at its worst? NA Sleep (in general) Fair  Pain is worse with: walking, bending, sitting, inactivity, standing and some activites Pain improves with: rest, heat/ice and medication Relief from Meds: 8  Mobility walk with assistance use a cane how many minutes can you walk? 30 ability to climb steps?  yes do you drive?  yes Do you have any goals in this area?  yes  Function disabled: date disabled 02/2014 I need assistance with the following:  dressing, meal prep, household duties and shopping Do you have any goals in this area?  yes  Neuro/Psych weakness numbness tremor tingling trouble walking spasms dizziness confusion depression anxiety loss of taste or smell  Prior Studies Any changes since last visit?  no  Physicians involved in your care Any changes since last visit?  no   Family History  Problem Relation Age of Onset  . Cancer Mother     cervical  . Cancer Father     colon  . Anxiety disorder Maternal Aunt   . Depression Maternal Aunt   . Anxiety disorder Maternal Uncle   . Depression Maternal Uncle     suicide attempt by gun shot wound to head. Survived  . Alcohol abuse Other   . Drug abuse Other    Social History   Social History  . Marital  Status: Married    Spouse Name: N/A  . Number of Children: N/A  . Years of Education: N/A   Social History Main Topics  . Smoking status: Never Smoker   . Smokeless tobacco: Never Used  . Alcohol Use: No  . Drug Use: No  . Sexual Activity: Yes   Other Topics Concern  . None   Social History Narrative   Past Surgical History  Procedure Laterality Date  . Abdominal hysterectomy  1999  . Tonsillectomy  1981  . Tubal ligation  1997  . Bunions removed  1988  . Edg  12/17/2004  . Electrocardiogram  05/27/2007  . Skin cancer excision    . Cholecystectomy  04/25/2008  . C4 c5 cage insertion  06/28/2013  . L5 s1 rod insertion  11/07/2012   Past Medical History  Diagnosis Date  . ANXIETY 06/10/2007  . ASYMPTOMATIC POSTMENOPAUSAL STATUS 07/19/2009  . Cough 04/02/2009  . DYSLIPIDEMIA 06/28/2008  . GERD 06/10/2007  . 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 HYPERGLYCEMIA 06/28/2008  . HYPERTENSION 06/10/2007  . Irritable bowel syndrome 06/28/2008  . OSTEOARTHRITIS 06/10/2007  . UPPER RESPIRATORY INFECTION 03/26/2009  . WEIGHT GAIN 01/21/2010  . Degenerative arthritis of spine     back and neck   BP 140/79 mmHg  Pulse 99  SpO2 97%  Opioid Risk Score:   Fall Risk Score:  `1  Depression screen PHQ  2/9  Depression screen PHQ 2/9 06/10/2015 01/02/2015  Decreased Interest 1 3  Down, Depressed, Hopeless 1 1  PHQ - 2 Score 2 4  Altered sleeping - 3  Tired, decreased energy - 2  Change in appetite - 2  Feeling bad or failure about yourself  - 1  Trouble concentrating - 3  Moving slowly or fidgety/restless - 2  Suicidal thoughts - 1  PHQ-9 Score - 18     Review of Systems  Respiratory: Positive for cough and shortness of breath.   Gastrointestinal: Positive for nausea.  Musculoskeletal:       Spasms  Skin: Positive for rash.  Neurological: Positive for dizziness, tremors, weakness and numbness.       Tingling Gait instability  Psychiatric/Behavioral: Positive for confusion. The patient is  nervous/anxious.        Depression  All other systems reviewed and are negative.      Objective:   Physical Exam  Constitutional: She is oriented to person, place, and time. She appears well-developed and well-nourished.  HENT:  Head: Normocephalic and atraumatic.  Neck: Normal range of motion. Neck supple.  Cardiovascular: Normal rate and regular rhythm.   Pulmonary/Chest: Effort normal and breath sounds normal.  Musculoskeletal:  Normal Muscle Bulk and Muscle Testing Reveals: Upper Extremities: Full ROM and Muscle Strength 5/5 Lumbar Paraspinal Tenderness: L-4- L-5 Sacral Tenderness: S-1 Lower Extremities: Full ROM and Muscle Strength 5/5 Arises from chair slowly Narrow Based gait using straight cane for support     Neurological: She is alert and oriented to person, place, and time.  Skin: Skin is warm and dry.  Psychiatric: She has a normal mood and affect.  Nursing note and vitals reviewed.         Assessment & Plan:  1. History of chronic lumbar facet disease with spondylosis/DDD/ L4-5 spondylolisthesis.With L4-5 L5-S1 decompression stabilization: Refilled: Hydrocodone 10/325mg  one tablet every 6 hours as needed #120.  2. Depression with anxiety: Continue Xanax, and Effexor. F/U with Dr. Valentina Shaggy. 3. Cervical spondylosis: Stable at this time with no complaints. Continue Medication Regime.  4. Post Concussion Syndrome: Continue Ritalin 10 mg one tablet twice a day 0700 and noon #60.  20 minutes of face to face patient care time was spent during this visit. All questions were encouraged and answered.   F/U in 1 month

## 2015-08-11 LAB — METHYLPHENIDATE METAB, QUANT, U: Ritalinic Acid: 19007 ng/mL — ABNORMAL HIGH (ref <100)

## 2015-08-13 LAB — PMP ALCOHOL METABOLITE (ETG): Ethyl Glucuronide (EtG): NEGATIVE ng/mL

## 2015-08-16 LAB — BENZODIAZEPINES (GC/LC/MS), URINE
Alprazolam metabolite (GC/LC/MS), ur confirm: 304 ng/mL (ref <25)
Clonazepam metabolite (GC/LC/MS), ur confirm: NEGATIVE ng/mL (ref <25)
FLURAZEPAMU: NEGATIVE ng/mL (ref <50)
LORAZEPAMU: NEGATIVE ng/mL (ref <50)
MIDAZOLAMU: NEGATIVE ng/mL (ref <50)
Nordiazepam (GC/LC/MS), ur confirm: NEGATIVE ng/mL (ref <50)
Oxazepam (GC/LC/MS), ur confirm: NEGATIVE ng/mL (ref <50)
TEMAZEPAMU: NEGATIVE ng/mL (ref <50)
Triazolam metabolite (GC/LC/MS), ur confirm: NEGATIVE ng/mL (ref <50)

## 2015-08-16 LAB — OPIATES/OPIOIDS (LC/MS-MS)
Codeine Urine: NEGATIVE ng/mL (ref <50)
Hydrocodone: 4219 ng/mL (ref <50)
Hydromorphone: 254 ng/mL (ref <50)
MORPHINE: NEGATIVE ng/mL (ref <50)
NOROXYCODONE, UR: NEGATIVE ng/mL (ref <50)
Norhydrocodone, Ur: 9645 ng/mL (ref <50)
OXYCODONE, UR: NEGATIVE ng/mL (ref <50)
OXYMORPHONE, URINE: NEGATIVE ng/mL (ref <50)

## 2015-08-17 LAB — PRESCRIPTION MONITORING PROFILE (SOLSTAS)
AMPHETAMINE/METH: NEGATIVE ng/mL
BUPRENORPHINE, URINE: NEGATIVE ng/mL
Barbiturate Screen, Urine: NEGATIVE ng/mL
Cannabinoid Scrn, Ur: NEGATIVE ng/mL
Carisoprodol, Urine: NEGATIVE ng/mL
Cocaine Metabolites: NEGATIVE ng/mL
Creatinine, Urine: 117.18 mg/dL (ref >20.0)
FENTANYL URINE: NEGATIVE ng/mL
MDMA URINE: NEGATIVE ng/mL
METHADONE SCREEN, URINE: NEGATIVE ng/mL
Meperidine, Ur: NEGATIVE ng/mL
NITRITES URINE, INITIAL: NEGATIVE ug/mL
Oxycodone Screen, Ur: NEGATIVE ng/mL
PROPOXYPHENE: NEGATIVE ng/mL
TAPENTADOLUR: NEGATIVE ng/mL
Tramadol Scrn, Ur: NEGATIVE ng/mL
ZOLPIDEM, URINE: NEGATIVE ng/mL
pH, Initial: 6.9 pH (ref 4.5–8.9)

## 2015-08-18 ENCOUNTER — Encounter: Payer: Self-pay | Admitting: Endocrinology

## 2015-08-28 NOTE — Progress Notes (Signed)
Urine drug screen for this encounter is consistent for prescribed medication 

## 2015-08-30 ENCOUNTER — Encounter: Payer: Self-pay | Admitting: Internal Medicine

## 2015-09-03 ENCOUNTER — Encounter: Payer: Self-pay | Admitting: Endocrinology

## 2015-09-09 ENCOUNTER — Encounter: Payer: Self-pay | Admitting: Registered Nurse

## 2015-09-09 ENCOUNTER — Encounter: Payer: 59 | Attending: Physical Medicine and Rehabilitation | Admitting: Registered Nurse

## 2015-09-09 VITALS — BP 135/75 | HR 89 | Resp 14

## 2015-09-09 DIAGNOSIS — F411 Generalized anxiety disorder: Secondary | ICD-10-CM | POA: Insufficient documentation

## 2015-09-09 DIAGNOSIS — Z79899 Other long term (current) drug therapy: Secondary | ICD-10-CM

## 2015-09-09 DIAGNOSIS — M461 Sacroiliitis, not elsewhere classified: Secondary | ICD-10-CM | POA: Diagnosis present

## 2015-09-09 DIAGNOSIS — G894 Chronic pain syndrome: Secondary | ICD-10-CM

## 2015-09-09 DIAGNOSIS — F332 Major depressive disorder, recurrent severe without psychotic features: Secondary | ICD-10-CM

## 2015-09-09 DIAGNOSIS — M47817 Spondylosis without myelopathy or radiculopathy, lumbosacral region: Secondary | ICD-10-CM | POA: Diagnosis present

## 2015-09-09 DIAGNOSIS — Z5181 Encounter for therapeutic drug level monitoring: Secondary | ICD-10-CM

## 2015-09-09 MED ORDER — AMITRIPTYLINE HCL 10 MG PO TABS: 10.0000 mg | ORAL_TABLET | Freq: Every evening | ORAL | Status: DC | PRN

## 2015-09-09 MED ORDER — HYDROCODONE-ACETAMINOPHEN 10-325 MG PO TABS: 1.0000 | ORAL_TABLET | Freq: Four times a day (QID) | ORAL | Status: DC | PRN

## 2015-09-09 MED ORDER — METHYLPHENIDATE HCL 10 MG PO TABS: 10.0000 mg | ORAL_TABLET | Freq: Two times a day (BID) | ORAL | Status: DC

## 2015-09-09 MED ORDER — ALPRAZOLAM 0.5 MG PO TABS: 0.5000 mg | ORAL_TABLET | Freq: Three times a day (TID) | ORAL | Status: DC | PRN

## 2015-09-09 NOTE — Progress Notes (Signed)
Subjective:    Patient ID: [name], female    DOB: December 01, 1955, 59 y.o.   MRN: [id number]  HPI: [name] is a 58 year old female who returns for follow up for chronic pain and medication refill. She says her pain is located in her mid-lower back radiating into left leg posteriorly and sacral tenderness. She rates her pain 8. Her current exercise regime is walking.   Pain Inventory Average Pain 8 Pain Right Now 8 My pain is sharp, burning, dull, stabbing, tingling and aching  In the last 24 hours, has pain interfered with the following? General activity 10 Relation with others 10 Enjoyment of life 10 What TIME of day is your pain at its worst? na Sleep (in general) Fair  Pain is worse with: walking, sitting, inactivity, standing, unsure and some activites Pain improves with: rest, heat/ice, pacing activities and medication Relief from Meds: 7  Mobility use a cane ability to climb steps?  yes do you drive?  yes needs help with transfers  Function not employed: date last employed 11/2008 disabled: date disabled 02/2014 I need assistance with the following:  meal prep, household duties and shopping  Neuro/Psych bladder control problems weakness numbness tremor tingling trouble walking spasms dizziness confusion depression anxiety loss of taste or smell  Prior Studies Any changes since last visit?  no  Physicians involved in your care Any changes since last visit?  no   Family History  Problem Relation Age of Onset  . Cancer Mother     cervical  . Cancer Father     colon  . Anxiety disorder Maternal Aunt   . Depression Maternal Aunt   . Anxiety disorder Maternal Uncle   . Depression Maternal Uncle     suicide attempt by gun shot wound to head. Survived  . Alcohol abuse Other   . Drug abuse Other    Social History   Social History  . Marital Status: Married    Spouse Name: N/A  . Number of Children: N/A  . Years of Education: N/A     Social History Main Topics  . Smoking status: Never Smoker   . Smokeless tobacco: Never Used  . Alcohol Use: No  . Drug Use: No  . Sexual Activity: Yes   Other Topics Concern  . None   Social History Narrative   Past Surgical History  Procedure Laterality Date  . Abdominal hysterectomy  1999  . Tonsillectomy  1981  . Tubal ligation  1997  . Bunions removed  1988  . Edg  12/17/2004  . Electrocardiogram  05/27/2007  . Skin cancer excision    . Cholecystectomy  04/25/2008  . C4 c5 cage insertion  06/28/2013  . L5 s1 rod insertion  11/07/2012   Past Medical History  Diagnosis Date  . ANXIETY 06/10/2007  . ASYMPTOMATIC POSTMENOPAUSAL STATUS 07/19/2009  . Cough 04/02/2009  . DYSLIPIDEMIA 06/28/2008  . GERD 06/10/2007  . 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 HYPERGLYCEMIA 06/28/2008  . HYPERTENSION 06/10/2007  . Irritable bowel syndrome 06/28/2008  . OSTEOARTHRITIS 06/10/2007  . UPPER RESPIRATORY INFECTION 03/26/2009  . WEIGHT GAIN 01/21/2010  . Degenerative arthritis of spine     back and neck   BP 135/75 mmHg  Pulse 89  Resp 14  SpO2 98%  Opioid Risk Score:   Fall Risk Score:  `1  Depression screen PHQ 2/9  Depression screen [facility] 2/9 06/10/2015 01/02/2015  Decreased Interest 1 3  Down, Depressed, Hopeless 1 1  PHQ -  2 Score 2 4  Altered sleeping - 3  Tired, decreased energy - 2  Change in appetite - 2  Feeling bad or failure about yourself  - 1  Trouble concentrating - 3  Moving slowly or fidgety/restless - 2  Suicidal thoughts - 1  PHQ-9 Score - 18     Review of Systems  Constitutional: Positive for appetite change.  Respiratory: Positive for cough and shortness of breath.        Respiratory infections  Cardiovascular:       Limb swelling  Genitourinary:       Bladder control problems  Skin: Positive for rash.  Neurological: Positive for tremors, weakness and numbness.       Tingling Loss of taste or smell  Psychiatric/Behavioral: Positive for confusion and dysphoric mood. The  patient is nervous/anxious.   All other systems reviewed and are negative.      Objective:   Physical Exam  Constitutional: She is oriented to person, place, and time. She appears well-developed and well-nourished.  HENT:  Head: Normocephalic and atraumatic.  Neck: Normal range of motion. Neck supple.  Cardiovascular: Normal rate and regular rhythm.   Pulmonary/Chest: Effort normal and breath sounds normal.  Musculoskeletal:  Normal Muscle Bulk and Muscle Testing Reveals: Upper Extremities: Full ROM and Muscle Strength 5/5 Thoracic Paraspinal Tenderness: T-10- T-12 Lumbar Hypersensitivity Lower Extremities: Full ROM and Muscle Strength 5/5 Right Lower Flexion Produces Pain into Lumbar Arises from chair slowly using straight cane for support Narrow Based gait  Neurological: She is alert and oriented to person, place, and time.  Skin: Skin is warm and dry.  Psychiatric: She has a normal mood and affect.  Nursing note and vitals reviewed.         Assessment & Plan:  1. History of chronic lumbar facet disease with spondylosis/DDD/ L4-5 spondylolisthesis.With L4-5 L5-S1 decompression stabilization: Refilled: Hydrocodone 10/325mg  one tablet every 6 hours as needed #120.  2. Depression with anxiety: Continue Xanax, and Effexor. F/U with Dr. Valentina Shaggy. 3. Cervical spondylosis: Stable at this time with no complaints. Continue Medication Regime.  4. Post Concussion Syndrome: Continue Ritalin 10 mg one tablet twice a day 0700 and noon #60. 5. Insomnia: Continue Elavil  20 minutes of face to face patient care time was spent during this visit. All questions were encouraged and answered.   F/U in 1 month

## 2015-09-16 ENCOUNTER — Other Ambulatory Visit (INDEPENDENT_AMBULATORY_CARE_PROVIDER_SITE_OTHER): Payer: 59

## 2015-09-16 ENCOUNTER — Ambulatory Visit (INDEPENDENT_AMBULATORY_CARE_PROVIDER_SITE_OTHER): Payer: 59 | Admitting: Neurology

## 2015-09-16 ENCOUNTER — Encounter: Payer: Self-pay | Admitting: Neurology

## 2015-09-16 VITALS — BP 134/70 | HR 92 | Ht 61.5 in | Wt 165.0 lb

## 2015-09-16 DIAGNOSIS — R55 Syncope and collapse: Secondary | ICD-10-CM | POA: Diagnosis not present

## 2015-09-16 DIAGNOSIS — R251 Tremor, unspecified: Secondary | ICD-10-CM | POA: Diagnosis not present

## 2015-09-16 DIAGNOSIS — R413 Other amnesia: Secondary | ICD-10-CM | POA: Diagnosis not present

## 2015-09-16 DIAGNOSIS — H539 Unspecified visual disturbance: Secondary | ICD-10-CM

## 2015-09-16 LAB — VITAMIN B12: VITAMIN B 12: 359 pg/mL (ref 211–911)

## 2015-09-16 NOTE — Progress Notes (Addendum)
NEUROLOGY CONSULTATION NOTE  Rielle Schlauch MRN: [id number] DOB: 06-02-1956  Referring provider: Dr. Loanne Drilling Primary care provider: Dr. Loanne Drilling  Reason for consult:  Vertigo, memory  HISTORY OF PRESENT ILLNESS: [name] is a 59 year old right-handed female with chronic pain syndrome, anxiety, depression, PTSD, dyslipidemia, hyperglycemia, hypertension, IBS, and osteoarthritis who presents for vertigo, visual disturbance and memory deficits.  History obtained by patient, her sister and PCP note.  Labs reviewed.  DIZZINESS: Sometimes when she squats and stands up, she has a sensation of dizziness described as lightheadedness, followed by a blacking out of vision for a moment.  There is no loss of awareness, nausea or spinning.  MEMORY DEFICITS: She and her sister report short-term memory problems which have gradually gotten worse over the past several years.  Now, she is sometimes disoriented when she drives on familiar routes.  Sometimes she will drive to the grocery store and forget why she went there.  She used to pay the bills but her husband took over several years ago because she would frequently make mistakes such as forgetting to pay the bills or pay a bill twice.  She sometimes has trouble remembering the names of people she just met but also she will forget the last names of familiar friends.  There is no family history of dementia.  She reports having sustained a concussion in 2015 after falling and hitting the right side of her head on the pavement.  Recent TSH was 3.02.  VISUAL DISTURBANCE: Sometimes, when she sees lights while driving at night, or when laying in bed, she will briefly notice a light in the periphery of her vision in the left eye.  She saw an ophthalmologist and retinal specialist, who didn't find anything.  There is no associated headache or nausea.  She also reports tremor of the left hand.  PAST MEDICAL HISTORY: Past Medical History  Diagnosis Date  .  ANXIETY 06/10/2007  . ASYMPTOMATIC POSTMENOPAUSAL STATUS 07/19/2009  . Cough 04/02/2009  . DYSLIPIDEMIA 06/28/2008  . GERD 06/10/2007  . 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 HYPERGLYCEMIA 06/28/2008  . HYPERTENSION 06/10/2007  . Irritable bowel syndrome 06/28/2008  . OSTEOARTHRITIS 06/10/2007  . UPPER RESPIRATORY INFECTION 03/26/2009  . WEIGHT GAIN 01/21/2010  . Degenerative arthritis of spine     back and neck    PAST SURGICAL HISTORY: Past Surgical History  Procedure Laterality Date  . Abdominal hysterectomy  1999  . Tonsillectomy  1981  . Tubal ligation  1997  . Bunions removed  1988  . Edg  12/17/2004  . Electrocardiogram  05/27/2007  . Skin cancer excision    . Cholecystectomy  04/25/2008  . C4 c5 cage insertion  06/28/2013  . L5 s1 rod insertion  11/07/2012    MEDICATIONS: Current Outpatient Prescriptions on File Prior to Visit  Medication Sig Dispense Refill  . ALPRAZolam (XANAX) 0.5 MG tablet Take 1 tablet (0.5 mg total) by mouth 3 (three) times daily as needed for anxiety. 90 tablet 2  . amitriptyline (ELAVIL) 10 MG tablet Take 1-2 tablets (10-20 mg total) by mouth at bedtime as needed. 60 tablet 2  . aspirin 81 MG chewable tablet Chew 324 mg by mouth once.    Marland Kitchen atorvastatin (LIPITOR) 40 MG tablet TAKE 1 TABLET DAILY 90 tablet 1  . HYDROcodone-acetaminophen (NORCO) 10-325 MG tablet Take 1 tablet by mouth every 6 (six) hours as needed. 120 tablet 0  . losartan-hydrochlorothiazide (HYZAAR) 100-12.5 MG per tablet Take 1 tablet by mouth daily. [place]  tablet 11  . meclizine (ANTIVERT) 12.5 MG tablet TAKE 1 TABLET (12.5 MG TOTAL) BY MOUTH 3 (THREE) TIMES DAILY AS NEEDED. 45 tablet 2  . Melatonin 5 MG TABS Take 5 mg by mouth at bedtime as needed (for sleep).    . methylphenidate (RITALIN) 10 MG tablet Take 1 tablet (10 mg total) by mouth 2 (two) times daily with breakfast and lunch. 0700,1200 60 tablet 0  . omeprazole (PRILOSEC) 40 MG capsule TAKE 1 CAPSULE DAILY 90 capsule 2  . venlafaxine XR (EFFEXOR-XR) 150  MG 24 hr capsule Take 1 capsule (150 mg total) by mouth daily with breakfast. 90 capsule 3   No current facility-administered medications on file prior to visit.    ALLERGIES: Allergies  Allergen Reactions  . Gabapentin Other (See Comments)    Bad headaches  . Poison Ivy Extract [Extract Of Poison Ivy] Rash  . Poison Oak Extract [Extract Of Poison Oak] Rash  . Poison Sumac Extract Rash    FAMILY HISTORY: Family History  Problem Relation Age of Onset  . Cancer Mother     cervical  . Cancer Father     colon  . Anxiety disorder Maternal Aunt   . Depression Maternal Aunt   . Anxiety disorder Maternal Uncle   . Depression Maternal Uncle     suicide attempt by gun shot wound to head. Survived  . Alcohol abuse Other   . Drug abuse Other     SOCIAL HISTORY: Social History   Social History  . Marital Status: Married    Spouse Name: N/A  . Number of Children: N/A  . Years of Education: N/A   Occupational History  . Not on file.   Social History Main Topics  . Smoking status: Never Smoker   . Smokeless tobacco: Never Used  . Alcohol Use: No  . Drug Use: No  . Sexual Activity: Yes   Other Topics Concern  . Not on file   Social History Narrative   Pt has h.s. degree    REVIEW OF SYSTEMS: Constitutional: No fevers, chills, or sweats, no generalized fatigue, change in appetite Eyes: No visual changes, double vision, eye pain Ear, nose and throat: No hearing loss, ear pain, nasal congestion, sore throat Cardiovascular: No chest pain, palpitations Respiratory:  No shortness of breath at rest or with exertion, wheezes GastrointestinaI: No nausea, vomiting, diarrhea, abdominal pain, fecal incontinence Genitourinary:  No dysuria, urinary retention or frequency Musculoskeletal:  No neck pain, back pain Integumentary: No rash, pruritus, skin lesions Neurological: as above Psychiatric: No depression, insomnia, anxiety Endocrine: No palpitations, fatigue, diaphoresis,  mood swings, change in appetite, change in weight, increased thirst Hematologic/Lymphatic:  No anemia, purpura, petechiae. Allergic/Immunologic: no itchy/runny eyes, nasal congestion, recent allergic reactions, rashes  PHYSICAL EXAM: Filed Vitals:   09/16/15 1430  BP: 134/70  Pulse: 92   General: No acute distress.  Patient appears well-groomed.  Head:  Normocephalic/atraumatic Eyes:  fundi unremarkable, without vessel changes, exudates, hemorrhages or papilledema. Neck: supple, no paraspinal tenderness, full range of motion Back: No paraspinal tenderness Heart: regular rate and rhythm Lungs: Clear to auscultation bilaterally. Vascular: No carotid bruits. Neurological Exam: Mental status: alert and oriented to person, place, and time, recent and remote memory intact, fund of knowledge intact, attention and concentration intact, speech fluent and not dysarthric, language intact. MMSE - Mini Mental State Exam 09/16/2015  Orientation to time 4  Orientation to Place 5  Registration 3  Attention/ Calculation 5  Recall 3  Language-  name 2 objects 2  Language- repeat 1  Language- follow 3 step command 3  Language- read & follow direction 1  Write a sentence 1  Copy design 1  Total score 29   Cranial nerves: CN I: not tested CN II: pupils equal, round and reactive to light, visual fields intact, fundi unremarkable, without vessel changes, exudates, hemorrhages or papilledema. CN III, IV, VI:  full range of motion, no nystagmus, no ptosis CN V: facial sensation intact CN VII: upper and lower face symmetric CN VIII: hearing intact CN IX, X: gag intact, uvula midline CN XI: sternocleidomastoid and trapezius muscles intact CN XII: tongue midline Bulk & Tone: normal, no fasciculations. Motor:  5/5 throughout  Sensation: temperature and vibration sensation intact. Deep Tendon Reflexes:  2+ throughout, toes downgoing.  Finger to nose testing:  Without dysmetria.  Heel to shin:   Without dysmetria.  Gait:  Right limp, ambulates with cane.  Able to turn and tandem walk. Romberg negative.  IMPRESSION: Near-syncope.  This is not vertigo.  It does not appear to be neurologic.  Consider workup for syncope via PCP or cardiology Memory deficits.   Visual disturbance.  Again, these do not appear to be neurologic Tremor.  No tremor appreciated on today's exam   PLAN: 1.  To evaluate memory deficits, we will get MRI of the brain 2.  We will also check B12 level 3.  If unremarkable, will refer for neuropsychological testing 4.  Follow up after testing.  Thank you for allowing me to take part in the care of this patient.  Metta Clines, DO  CC:  Renato Shin, MD

## 2015-09-18 ENCOUNTER — Ambulatory Visit
Admission: RE | Admit: 2015-09-18 | Discharge: 2015-09-18 | Disposition: A | Discharge status: Home / Self Care | Payer: 59 | Source: Ambulatory Visit

## 2015-09-18 DIAGNOSIS — Z1231 Encounter for screening mammogram for malignant neoplasm of breast: Secondary | ICD-10-CM

## 2015-09-25 ENCOUNTER — Ambulatory Visit
Admission: RE | Admit: 2015-09-25 | Discharge: 2015-09-25 | Disposition: A | Discharge status: Home / Self Care | Payer: 59 | Source: Ambulatory Visit | Attending: Neurology | Admitting: Neurology

## 2015-09-25 DIAGNOSIS — R413 Other amnesia: Secondary | ICD-10-CM

## 2015-09-26 ENCOUNTER — Telehealth: Payer: Self-pay

## 2015-09-26 DIAGNOSIS — R413 Other amnesia: Secondary | ICD-10-CM

## 2015-09-26 NOTE — Telephone Encounter (Signed)
-----   Message from Pieter Partridge, DO sent at 09/26/2015  7:21 AM EST ----- MRI shows changes that are often seen in people with history of high blood pressure but nothing that would account for memory loss.  I would like to refer her for neuropsychological testing to further evaluate the memory loss and have her follow up soon afterwards to discuss results and next steps.

## 2015-09-26 NOTE — Telephone Encounter (Signed)
Message relayed to patient. Verbalized understanding and denied questions.   

## 2015-09-26 NOTE — Telephone Encounter (Signed)
Pt called back and stated that after speaking to husband she thinks she already had that done in 2015 w/ Dr. Valentina Shaggy.

## 2015-09-28 ENCOUNTER — Other Ambulatory Visit: Payer: Self-pay | Admitting: Endocrinology

## 2015-09-30 ENCOUNTER — Encounter: Payer: 59 | Attending: Physical Medicine and Rehabilitation | Admitting: Physical Medicine & Rehabilitation

## 2015-09-30 ENCOUNTER — Encounter: Payer: Self-pay | Admitting: Physical Medicine & Rehabilitation

## 2015-09-30 VITALS — BP 107/62 | HR 95 | Resp 16

## 2015-09-30 DIAGNOSIS — M47812 Spondylosis without myelopathy or radiculopathy, cervical region: Secondary | ICD-10-CM

## 2015-09-30 DIAGNOSIS — M461 Sacroiliitis, not elsewhere classified: Secondary | ICD-10-CM | POA: Insufficient documentation

## 2015-09-30 DIAGNOSIS — M47816 Spondylosis without myelopathy or radiculopathy, lumbar region: Secondary | ICD-10-CM

## 2015-09-30 DIAGNOSIS — F411 Generalized anxiety disorder: Secondary | ICD-10-CM | POA: Diagnosis present

## 2015-09-30 DIAGNOSIS — F0781 Postconcussional syndrome: Secondary | ICD-10-CM | POA: Diagnosis not present

## 2015-09-30 DIAGNOSIS — I10 Essential (primary) hypertension: Secondary | ICD-10-CM

## 2015-09-30 DIAGNOSIS — M47817 Spondylosis without myelopathy or radiculopathy, lumbosacral region: Secondary | ICD-10-CM | POA: Diagnosis not present

## 2015-09-30 MED ORDER — LOSARTAN POTASSIUM-HCTZ 50-12.5 MG PO TABS: 1.0000 | ORAL_TABLET | Freq: Every day | ORAL | Status: DC

## 2015-09-30 MED ORDER — HYDROCODONE-ACETAMINOPHEN 10-325 MG PO TABS: 1.0000 | ORAL_TABLET | Freq: Four times a day (QID) | ORAL | Status: DC | PRN

## 2015-09-30 MED ORDER — METHYLPHENIDATE HCL 10 MG PO TABS: 10.0000 mg | ORAL_TABLET | Freq: Two times a day (BID) | ORAL | Status: DC

## 2015-09-30 NOTE — Progress Notes (Signed)
Subjective:    Patient ID: [name], female    DOB: [birth year], 57 y.o.   MRN: [id number]  HPI   [name] is here in follow up of her chronic pain and concussion symptoms. She states she is still having some dizziness when she goes from a bending to standing position. She is having ongoing low back pain as well. Her right hip bothers her with walking and when she sleeps on her right side.   She was referred to neurology for ongoing memory deficits. 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 Neurologist recommended neuropsych testing apparently.  She continues on her ritalin as prescribed and uses a notebook to keep track of items. She states this works well for her.   Pain Inventory Average Pain 8 Pain Right Now 9 My pain is sharp, burning, dull, stabbing, tingling and aching  In the last 24 hours, has pain interfered with the following? General activity 10 Relation with others 10 Enjoyment of life 10 What TIME of day is your pain at its worst? morning, daytime, evening, night Sleep (in general) Fair  Pain is worse with: walking, bending, sitting, inactivity, standing, unsure, some activites and other Pain improves with: rest, heat/ice, pacing activities, medication and other Relief from Meds: 6  Mobility use a cane use a walker how many minutes can you walk? 30-60 ability to climb steps?  yes do you drive?  yes  Function disabled: date disabled 02/2014 I need assistance with the following:  dressing, meal prep, household duties and shopping  Neuro/Psych bladder control problems weakness numbness tremor tingling trouble walking spasms dizziness confusion depression anxiety loss of taste or smell  Prior Studies Any changes since last visit?  yes CT/MRI  Physicians involved in your care Primary care . Neurologist Dr. Tomi Likens   Family History  Problem Relation Age of Onset  . Cancer Mother     cervical  . Cancer Father     colon  . Anxiety disorder  Maternal Aunt   . Depression Maternal Aunt   . Anxiety disorder Maternal Uncle   . Depression Maternal Uncle     suicide attempt by gun shot wound to head. Survived  . Alcohol abuse Other   . Drug abuse Other    Social History   Social History  . Marital Status: Married    Spouse Name: N/A  . Number of Children: N/A  . Years of Education: N/A   Social History Main Topics  . Smoking status: Never Smoker   . Smokeless tobacco: Never Used  . Alcohol Use: No  . Drug Use: No  . Sexual Activity: Yes   Other Topics Concern  . None   Social History Narrative   Pt has h.s. degree   Past Surgical History  Procedure Laterality Date  . Abdominal hysterectomy  1999  . Tonsillectomy  1981  . Tubal ligation  1997  . Bunions removed  1988  . Edg  12/17/2004  . Electrocardiogram  05/27/2007  . Skin cancer excision    . Cholecystectomy  04/25/2008  . C4 c5 cage insertion  06/28/2013  . L5 s1 rod insertion  11/07/2012   Past Medical History  Diagnosis Date  . ANXIETY 06/10/2007  . ASYMPTOMATIC POSTMENOPAUSAL STATUS 07/19/2009  . Cough 04/02/2009  . DYSLIPIDEMIA 06/28/2008  . GERD 06/10/2007  . 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 HYPERGLYCEMIA 06/28/2008  . HYPERTENSION 06/10/2007  . Irritable bowel syndrome 06/28/2008  . OSTEOARTHRITIS 06/10/2007  . UPPER RESPIRATORY INFECTION 03/26/2009  .  WEIGHT GAIN 01/21/2010  . Degenerative arthritis of spine     back and neck   BP 107/62 mmHg  Pulse 95  Resp 16  SpO2 97%  Opioid Risk Score:   Fall Risk Score:  `1  Depression screen PHQ 2/9  Depression screen [facility] 2/9 06/10/2015 01/02/2015  Decreased Interest 1 3  Down, Depressed, Hopeless 1 1  PHQ - 2 Score 2 4  Altered sleeping - 3  Tired, decreased energy - 2  Change in appetite - 2  Feeling bad or failure about yourself  - 1  Trouble concentrating - 3  Moving slowly or fidgety/restless - 2  Suicidal thoughts - 1  PHQ-9 Score - 18     Review of Systems  Constitutional: Positive for unexpected  weight change.       Bladder control problems Loss of taste or smell  Respiratory: Positive for cough and shortness of breath.   Cardiovascular: Positive for leg swelling.  Musculoskeletal: Positive for gait problem.  Neurological: Positive for dizziness, tremors, weakness and numbness.       Tingling spasms  Psychiatric/Behavioral: Positive for confusion and dysphoric mood. The patient is nervous/anxious.   All other systems reviewed and are negative.      Objective:   Physical Exam  Constitutional: She is oriented to person, place, and time. She appears well-developed and well-nourished.  HENT:  Head: Normocephalic and atraumatic.  Eyes: Conjunctivae and EOM are normal. Pupils are equal, round, and reactive to light.  Neck: Normal range of motion.  Cardiovascular: Normal rate.  Pulmonary/Chest: Effort normal and breath sounds normal.  Abdominal: Soft. Bowel sounds are normal.  Musculoskeletal: Normal range of motion.  Back tender to palpation and extension today more than flexion. She has pain with palpation of the right greater troch.  SIJ, PSIS's are painful. lower lumbar paraspinals are tight. Walks with cane. Reasonable balance and weight shift  Neurological: memory and attention are better. She takes her time. Focus is improved. . Strength symmetrical. Sensory is normal. DTR's 1+.  Skin: Skin is warm.  Psychiatric: affect MUCH improved.    Assessment & Plan:   ASSESSMENT:  1. 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 s/p L4-5 L5-S1 decompression stabilization with substantial mprovement of her back and right leg pain.  2. Left knee meniscus injury.  3. Depression with anxiety. Much mproved with effexor and spiritual/social supports in place  4. Cervical spondylosis/Myofascial pain.- see below  5. Concussion on 05/13/14 with post concussion syndrome- she has ongoing memory and concentration deficits.  6. SIJ inflammation secondary  to chronic back issues and loss of mobility    PLAN:  1. Greater troch exercises were provided today. Consider injection if pain does not subside.  2. May need to adjust hyzaar down. Be careful to avoid sudden position changes. Decreased to 50/12.5mg ---she will observe 3. Hydrocodone will continue for breakthrough pain 10/325 q6 prn #120  4. May continue melatonin to assist with sleep architecture  4. Elavil prn at bedtime 5. Maintain ritalin to 10mg  tid for attention/arousal--- this is effective.  Memory deficits are out of result of her concussion/ head trauma. She does have SVD on MRI which was non-specific. Neuropsych testing/cognitive testing is not indicated as we've done this in the past and she is compensating with ritalin and adaptive strategies.  6 .Follow up in about 1 months with me or NP. 30 minutes of face to face patient care time were spent during this visit. All questions were  encouraged and answered.

## 2015-09-30 NOTE — Patient Instructions (Signed)
PLEASE CALL ME WITH ANY PROBLEMS OR QUESTIONS (#[phone]). HAVE A HAPPY HOLIDAY SEASON!!!    

## 2015-10-03 ENCOUNTER — Telehealth: Payer: Self-pay | Admitting: *Deleted

## 2015-10-03 NOTE — Telephone Encounter (Signed)
Received fax from [facility] requesting to clarify which Losartan/HCTz dose to follow as they have two, one from Dr Loanne Drilling and one from Dr Naaman Plummer on 09/30/15. I verified from [facility] that Dr Naaman Plummer wanted to decrease her dose so I called [facility] and gave them the approval to follow Dr Naaman Plummer order. Fax sent to scan center for media

## 2015-10-04 ENCOUNTER — Encounter: Payer: Self-pay | Admitting: *Deleted

## 2015-10-22 ENCOUNTER — Other Ambulatory Visit: Payer: Self-pay

## 2015-10-22 ENCOUNTER — Encounter: Payer: Self-pay | Admitting: Endocrinology

## 2015-10-23 ENCOUNTER — Other Ambulatory Visit: Payer: Self-pay | Admitting: Endocrinology

## 2015-10-23 MED ORDER — ACYCLOVIR 800 MG PO TABS: 800.0000 mg | ORAL_TABLET | Freq: Every day | ORAL | Status: DC

## 2015-10-31 ENCOUNTER — Encounter: Payer: 59 | Attending: Physical Medicine and Rehabilitation | Admitting: Registered Nurse

## 2015-10-31 ENCOUNTER — Encounter: Payer: Self-pay | Admitting: Registered Nurse

## 2015-10-31 VITALS — BP 121/64 | HR 91 | Resp 16

## 2015-10-31 DIAGNOSIS — M461 Sacroiliitis, not elsewhere classified: Secondary | ICD-10-CM | POA: Insufficient documentation

## 2015-10-31 DIAGNOSIS — F0781 Postconcussional syndrome: Secondary | ICD-10-CM

## 2015-10-31 DIAGNOSIS — M961 Postlaminectomy syndrome, not elsewhere classified: Secondary | ICD-10-CM

## 2015-10-31 DIAGNOSIS — M47817 Spondylosis without myelopathy or radiculopathy, lumbosacral region: Secondary | ICD-10-CM | POA: Diagnosis not present

## 2015-10-31 DIAGNOSIS — F411 Generalized anxiety disorder: Secondary | ICD-10-CM | POA: Diagnosis not present

## 2015-10-31 DIAGNOSIS — Z79899 Other long term (current) drug therapy: Secondary | ICD-10-CM

## 2015-10-31 DIAGNOSIS — Z5181 Encounter for therapeutic drug level monitoring: Secondary | ICD-10-CM

## 2015-10-31 DIAGNOSIS — G894 Chronic pain syndrome: Secondary | ICD-10-CM | POA: Diagnosis not present

## 2015-10-31 MED ORDER — HYDROCODONE-ACETAMINOPHEN 10-325 MG PO TABS: 1.0000 | ORAL_TABLET | Freq: Four times a day (QID) | ORAL | Status: DC | PRN

## 2015-10-31 MED ORDER — ALPRAZOLAM 0.5 MG PO TABS: 0.5000 mg | ORAL_TABLET | Freq: Three times a day (TID) | ORAL | Status: DC | PRN

## 2015-10-31 MED ORDER — VENLAFAXINE HCL ER 150 MG PO CP24: 150.0000 mg | ORAL_CAPSULE | Freq: Every day | ORAL | Status: DC

## 2015-10-31 MED ORDER — METHYLPHENIDATE HCL 10 MG PO TABS: 10.0000 mg | ORAL_TABLET | Freq: Two times a day (BID) | ORAL | Status: DC

## 2015-10-31 NOTE — Progress Notes (Signed)
Subjective:    Patient ID: [name], female    DOB: April 06, 1956, 59 y.o.   MRN: [id number]  HPI: [name] is a 60 year old female who returns for follow up for chronic pain and medication refill. She says her pain is located in her upper -lower back and right hip. She rates her pain 8. Her current exercise regime is walking and performing stretching exercises. Ms. Evans friend was diagnosed with cancer and placed under hospice care she has been sitting with her. Will allow her to return in March she verbalizes understanding. Emotional support given.  Pain Inventory Average Pain 8 Pain Right Now 8 My pain is sharp, burning, dull, stabbing, tingling and aching  In the last 24 hours, has pain interfered with the following? General activity 10 Relation with others 10 Enjoyment of life 10 What TIME of day is your pain at its worst? all Sleep (in general) Fair  Pain is worse with: walking, bending, sitting, inactivity, standing, unsure and some activites Pain improves with: rest, heat/ice, pacing activities and medication Relief from Meds: 8  Mobility walk without assistance how many minutes can you walk? varies ability to climb steps?  yes do you drive?  yes use a wheelchair Do you have any goals in this area?  yes  Function disabled: date disabled . I need assistance with the following:  dressing, meal prep, household duties and shopping  Neuro/Psych weakness numbness tremor tingling trouble walking spasms dizziness confusion depression anxiety  Prior Studies Any changes since last visit?  no  Physicians involved in your care Any changes since last visit?  no   Family History  Problem Relation Age of Onset  . Cancer Mother     cervical  . Cancer Father     colon  . Anxiety disorder Maternal Aunt   . Depression Maternal Aunt   . Anxiety disorder Maternal Uncle   . Depression Maternal Uncle     suicide attempt by gun shot wound to head.  Survived  . Alcohol abuse Other   . Drug abuse Other    Social History   Social History  . Marital Status: Married    Spouse Name: N/A  . Number of Children: N/A  . Years of Education: N/A   Social History Main Topics  . Smoking status: Never Smoker   . Smokeless tobacco: Never Used  . Alcohol Use: No  . Drug Use: No  . Sexual Activity: Yes   Other Topics Concern  . None   Social History Narrative   Pt has h.s. degree   Past Surgical History  Procedure Laterality Date  . Abdominal hysterectomy  1999  . Tonsillectomy  1981  . Tubal ligation  1997  . Bunions removed  1988  . Edg  12/17/2004  . Electrocardiogram  05/27/2007  . Skin cancer excision    . Cholecystectomy  04/25/2008  . C4 c5 cage insertion  06/28/2013  . L5 s1 rod insertion  11/07/2012   Past Medical History  Diagnosis Date  . ANXIETY 06/10/2007  . ASYMPTOMATIC POSTMENOPAUSAL STATUS 07/19/2009  . Cough 04/02/2009  . DYSLIPIDEMIA 06/28/2008  . GERD 06/10/2007  . 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 HYPERGLYCEMIA 06/28/2008  . HYPERTENSION 06/10/2007  . Irritable bowel syndrome 06/28/2008  . OSTEOARTHRITIS 06/10/2007  . UPPER RESPIRATORY INFECTION 03/26/2009  . WEIGHT GAIN 01/21/2010  . Degenerative arthritis of spine     back and neck  . Tubular adenoma of colon   . Hiatal hernia   .  Esophageal stricture    BP 121/64 mmHg  Pulse 91  Resp 16  SpO2 97%  Opioid Risk Score:   Fall Risk Score:  `1  Depression screen PHQ 2/9  Depression screen [facility] 2/9 06/10/2015 01/02/2015  Decreased Interest 1 3  Down, Depressed, Hopeless 1 1  PHQ - 2 Score 2 4  Altered sleeping - 3  Tired, decreased energy - 2  Change in appetite - 2  Feeling bad or failure about yourself  - 1  Trouble concentrating - 3  Moving slowly or fidgety/restless - 2  Suicidal thoughts - 1  PHQ-9 Score - 18     Review of Systems  Constitutional: Positive for diaphoresis, appetite change and unexpected weight change.  Respiratory: Positive for cough and  shortness of breath.   Cardiovascular: Positive for leg swelling.  Gastrointestinal: Positive for diarrhea.  All other systems reviewed and are negative.      Objective:   Physical Exam  Constitutional: She is oriented to person, place, and time. She appears well-developed and well-nourished.  HENT:  Head: Normocephalic and atraumatic.  Neck: Normal range of motion. Neck supple.  Cardiovascular: Normal rate and regular rhythm.   Pulmonary/Chest: Effort normal and breath sounds normal.  Musculoskeletal:  Normal Muscle Bulk and Muscle Testing Reveals: Upper Extremities: Full ROM and Muscle Strength 5/5 Thoracic Paraspinal Tenderness: T-3- T-6 Lumbar Paraspinal Tenderness: L-3- L-5 Lower Extremities: Full ROM and Muscle Strength 5/5 Arises from chair with ease/ using straight cane for support Narrow Based gait   Neurological: She is alert and oriented to person, place, and time.  Skin: Skin is warm and dry.  Psychiatric: She has a normal mood and affect.  Nursing note and vitals reviewed.         Assessment & Plan:  1. History of chronic lumbar facet disease with spondylosis/DDD/ L4-5 spondylolisthesis.With L4-5 L5-S1 decompression stabilization: Refilled: Hydrocodone 10/325mg  one tablet every 6 hours as needed #120.  2. Depression with anxiety: Continue Xanax, and Effexor. F/U with Dr. Valentina Shaggy. 3. Cervical spondylosis: Stable at this time with no complaints. Continue Medication Regime.  4. Post Concussion Syndrome: Continue Ritalin 10 mg one tablet twice a day 0700 and noon #60. 5. Insomnia: Continue Elavil  20 minutes of face to face patient care time was spent during this visit. All questions were encouraged and answered.   F/U in 1 month

## 2015-11-05 ENCOUNTER — Other Ambulatory Visit (INDEPENDENT_AMBULATORY_CARE_PROVIDER_SITE_OTHER): Payer: 59

## 2015-11-05 ENCOUNTER — Ambulatory Visit (INDEPENDENT_AMBULATORY_CARE_PROVIDER_SITE_OTHER): Payer: 59 | Admitting: Internal Medicine

## 2015-11-05 ENCOUNTER — Encounter: Payer: Self-pay | Admitting: Internal Medicine

## 2015-11-05 VITALS — BP 120/80 | HR 96 | Ht 60.5 in | Wt 163.1 lb

## 2015-11-05 DIAGNOSIS — K449 Diaphragmatic hernia without obstruction or gangrene: Secondary | ICD-10-CM | POA: Diagnosis not present

## 2015-11-05 DIAGNOSIS — Z86010 Personal history of colonic polyps: Secondary | ICD-10-CM

## 2015-11-05 DIAGNOSIS — K219 Gastro-esophageal reflux disease without esophagitis: Secondary | ICD-10-CM

## 2015-11-05 DIAGNOSIS — Z800 Family history of malignant neoplasm of digestive organs: Secondary | ICD-10-CM

## 2015-11-05 DIAGNOSIS — D509 Iron deficiency anemia, unspecified: Secondary | ICD-10-CM | POA: Diagnosis not present

## 2015-11-05 LAB — CBC WITH DIFFERENTIAL/PLATELET
BASOS PCT: 0.7 % (ref 0.0–3.0)
Basophils Absolute: 0 10*3/uL (ref 0.0–0.1)
EOS ABS: 0 10*3/uL (ref 0.0–0.7)
Eosinophils Relative: 0.4 % (ref 0.0–5.0)
HCT: 39.1 % (ref 36.0–46.0)
HEMOGLOBIN: 12.8 g/dL (ref 12.0–15.0)
LYMPHS ABS: 1.5 10*3/uL (ref 0.7–4.0)
Lymphocytes Relative: 29.2 % (ref 12.0–46.0)
MCHC: 32.7 g/dL (ref 30.0–36.0)
MCV: 79.9 fl (ref 78.0–100.0)
MONO ABS: 0.3 10*3/uL (ref 0.1–1.0)
Monocytes Relative: 6.2 % (ref 3.0–12.0)
NEUTROS ABS: 3.2 10*3/uL (ref 1.4–7.7)
Neutrophils Relative %: 63.5 % (ref 43.0–77.0)
PLATELETS: 239 10*3/uL (ref 150.0–400.0)
RBC: 4.89 Mil/uL (ref 3.87–5.11)
RDW: 15.2 % (ref 11.5–15.5)
WBC: 5 10*3/uL (ref 4.0–10.5)

## 2015-11-05 LAB — IBC PANEL
IRON: 52 ug/dL (ref 42–145)
Saturation Ratios: 12.6 % — ABNORMAL LOW (ref 20.0–50.0)
Transferrin: 294 mg/dL (ref 212.0–360.0)

## 2015-11-05 LAB — FERRITIN: Ferritin: 17.7 ng/mL (ref 10.0–291.0)

## 2015-11-05 MED ORDER — PANTOPRAZOLE SODIUM 40 MG PO TBEC: 40.0000 mg | DELAYED_RELEASE_TABLET | Freq: Two times a day (BID) | ORAL | Status: DC

## 2015-11-05 NOTE — Progress Notes (Signed)
Patient ID: [name], female   DOB: 1956/04/15, 60 y.o.   MRN: [id number] HPI: [name] is a 60 year old female with history of GERD, hiatal hernia, adenomatous colon polyps, family history of colon cancer in her father at age 22 who is seen in consultation at the request of Dr. Loanne Drilling to evaluate heartburn and uncontrolled GERD. She is here alone today. She reports trouble with nightly reflux and heartburn. This is despite omeprazole 40 mg daily. She's having frequent sour brash and belching. Been going on for at least a year. She stopped eating late and dramatically reduced caffeine. She occasionally feels nocturnal emesis/regurgitation associated with cough and burning in her throat. Some nausea without vomiting. No dysphagia or odynophagia. Stable weight appetite slightly decreased.  She also recently has been found to be anemic with low iron studies. Her stools were heme-negative. She took iron supplementation and hemoglobin and iron studies improved dramatically. Primary care stopped her oral iron. She has not seen his will blood in her stool or melena. When she uses Pepto-Bismol for reflux at night her stools are dark. She reports regular bowel movements without diarrhea, constipation. No visible blood in her stool or melena.  She has had colonoscopy and prior endoscopy. Colonoscopy was last performed by Dr. Leonie Douglas on 10/11/2013 -- this revealed two 5 mm left-sided tubular adenomas which were removed, and was an otherwise normal exam. Five-year surveillance was recommended. Prior endoscopy March 2006 showed 5 cm hiatal hernia and severe grade C reflux esophagitis. There was suspicion for Barrett's esophagus at that time.  She has chronic back and spine pain treated by Dr. Tessa Lerner in pain clinic with hydrocodone. She is on disability.  Past Medical History  Diagnosis Date  . ANXIETY 06/10/2007  . ASYMPTOMATIC POSTMENOPAUSAL STATUS 07/19/2009  . Cough 04/02/2009  . DYSLIPIDEMIA  06/28/2008  . GERD 06/10/2007  . 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 HYPERGLYCEMIA 06/28/2008  . HYPERTENSION 06/10/2007  . Irritable bowel syndrome 06/28/2008  . OSTEOARTHRITIS 06/10/2007  . UPPER RESPIRATORY INFECTION 03/26/2009  . WEIGHT GAIN 01/21/2010  . Degenerative arthritis of spine     back and neck  . Tubular adenoma of colon   . Hiatal hernia   . Esophageal stricture   . Anemia   . Skin cancer     basal cell  . Chronic headaches   . Depression   . Fibromyalgia   . Gallstones   . Pneumonia     Past Surgical History  Procedure Laterality Date  . Abdominal hysterectomy  1999  . Tonsillectomy  1981  . Tubal ligation  1997  . Bunions removed  1988  . Edg  12/17/2004  . Electrocardiogram  05/27/2007  . Skin cancer excision    . Cholecystectomy  04/25/2008  . C4 c5 cage insertion  06/28/2013  . L5 s1 rod insertion  11/07/2012  . Skin cancer excision      several    Outpatient Prescriptions Prior to Visit  Medication Sig Dispense Refill  . acyclovir (ZOVIRAX) 800 MG tablet Take 1 tablet (800 mg total) by mouth daily. 90 tablet 3  . ALPRAZolam (XANAX) 0.5 MG tablet Take 1 tablet (0.5 mg total) by mouth 3 (three) times daily as needed for anxiety. 90 tablet 2  . amitriptyline (ELAVIL) 10 MG tablet Take 1-2 tablets (10-20 mg total) by mouth at bedtime as needed. 60 tablet 2  . atorvastatin (LIPITOR) 40 MG tablet TAKE 1 TABLET DAILY 90 tablet 1  . HYDROcodone-acetaminophen (NORCO) 10-325 MG tablet Take  1 tablet by mouth every 6 (six) hours as needed. 120 tablet 0  . losartan-hydrochlorothiazide (HYZAAR) 50-12.5 MG tablet Take 1 tablet by mouth daily. 30 tablet 3  . meclizine (ANTIVERT) 12.5 MG tablet TAKE 1 TABLET (12.5 MG TOTAL) BY MOUTH 3 (THREE) TIMES DAILY AS NEEDED. 45 tablet 2  . methylphenidate (RITALIN) 10 MG tablet Take 1 tablet (10 mg total) by mouth 2 (two) times daily with breakfast and lunch. 0700,1200 60 tablet 0  . venlafaxine XR (EFFEXOR-XR) 150 MG 24 hr capsule Take 1 capsule (150 mg  total) by mouth daily with breakfast. 90 capsule 3  . omeprazole (PRILOSEC) 40 MG capsule TAKE 1 CAPSULE DAILY 90 capsule 2  . aspirin 81 MG chewable tablet Chew 324 mg by mouth once.    . Melatonin 5 MG TABS Take 5 mg by mouth at bedtime as needed (for sleep).     No facility-administered medications prior to visit.    Allergies  Allergen Reactions  . Gabapentin Other (See Comments)    Bad headaches  . Poison Ivy Extract [Extract Of Poison Ivy] Rash  . Poison Oak Extract [Extract Of Poison Oak] Rash  . Poison Sumac Extract Rash    Family History  Problem Relation Age of Onset  . Cervical cancer Mother   . Colon cancer Father   . Anxiety disorder Maternal Aunt   . Depression Maternal Aunt   . Anxiety disorder Maternal Uncle   . Depression Maternal Uncle     suicide attempt by gun shot wound to head. Survived  . Alcohol abuse Other   . Drug abuse Other   . Breast cancer Maternal Grandmother   . Diabetes Maternal Grandmother   . Breast cancer Paternal Aunt   . Diabetes Mother   . Colon polyps Sister     x 2  . Heart attack Father   . Stroke Father   . Stroke Mother     Social History  Substance Use Topics  . Smoking status: Never Smoker   . Smokeless tobacco: Never Used  . Alcohol Use: No    ROS: As per history of present illness, otherwise negative  BP 120/80 mmHg  Pulse 96  Ht 5' 0.5" (1.537 m)  Wt 163 lb 2 oz (73.993 kg)  BMI 31.32 kg/m2 Constitutional: Well-developed and well-nourished. No distress. HEENT: Normocephalic and atraumatic. Oropharynx is clear and moist. No oropharyngeal exudate. Conjunctivae are normal.  No scleral icterus. Neck: Neck supple. Trachea midline. Cardiovascular: Normal rate, regular rhythm and intact distal pulses. No M/R/G Pulmonary/chest: Effort normal and breath sounds normal. No wheezing, rales or rhonchi. Abdominal: Soft, epigastric tenderness without rebound or guarding, nondistended. Bowel sounds active throughout.   Extremities: no clubbing, cyanosis, or edema Lymphadenopathy: No cervical adenopathy noted. Neurological: Alert and oriented to person place and time. Skin: Skin is warm and dry. No rashes noted. Psychiatric: Normal mood and affect. Behavior is normal.  RELEVANT LABS AND IMAGING: CBC    Component Value Date/Time   WBC 5.0 11/05/2015 1431   RBC 4.89 11/05/2015 1431   HGB 12.8 11/05/2015 1431   HCT 39.1 11/05/2015 1431   PLT 239.0 11/05/2015 1431   MCV 79.9 11/05/2015 1431   MCH 24.4* 06/12/2015 1742   MCHC 32.7 11/05/2015 1431   RDW 15.2 11/05/2015 1431   LYMPHSABS 1.5 11/05/2015 1431   MONOABS 0.3 11/05/2015 1431   EOSABS 0.0 11/05/2015 1431   BASOSABS 0.0 11/05/2015 1431    CMP     Component Value Date/Time  NA 139 06/25/2015 1402   K 3.5 06/25/2015 1402   CL 99 06/25/2015 1402   CO2 32 06/25/2015 1402   GLUCOSE 98 06/25/2015 1402   BUN 14 06/25/2015 1402   CREATININE 0.78 06/25/2015 1402   CREATININE 0.86 03/22/2014 1438   CALCIUM 9.5 06/25/2015 1402   PROT 7.2 06/25/2015 1402   ALBUMIN 4.6 06/25/2015 1402   AST 19 06/25/2015 1402   ALT 13 06/25/2015 1402   ALKPHOS 80 06/25/2015 1402   BILITOT 0.4 06/25/2015 1402   GFRNONAA >60 06/12/2015 1742   GFRNONAA 76 03/22/2014 1438   GFRAA >60 06/12/2015 1742   GFRAA 87 03/22/2014 1438   Iron/TIBC/Ferritin/ %Sat    Component Value Date/Time   IRON 52 11/05/2015 1431   FERRITIN 17.7 11/05/2015 1431   IRONPCTSAT 12.6* 11/05/2015 1431     ASSESSMENT/PLAN:  60 year old female with history of GERD, hiatal hernia, adenomatous colon polyps, family history of colon cancer in her father at age 65 who is seen in consultation at the request of Dr. Loanne Drilling to evaluate heartburn and uncontrolled GERD.  1. GERD/hiatal hernia/possible hx of Barrett's -- uncontrolled reflux symptoms despite omeprazole 40 mg daily. For this reason I recommended upper endoscopy for further evaluation. We discussed the risks, benefits and  alternatives and she is agreeable to proceed. I'm changing her PPI to pantoprazole 40 mg twice a day before meals to try to get better control of heartburn symptoms. Indications after endoscopy  2. History of iron deficiency anemia -- up-to-date with colonoscopy and stool heme-negative. Upper endoscopy also for this reason. Percent iron saturation has fallen back down and would like her to resume oral iron ferrous sulfate 325 mg once daily. Plan small bowel biopsies to is good celiac disease at upper endoscopy  3. History of adenoma of the colon/family history of colon cancer -- repeat colonoscopy recommended December 2019  [id number] Loanne Drilling, [place] [address] [place], [place] [address]

## 2015-11-05 NOTE — Patient Instructions (Addendum)
We have sent the following prescriptions to your mail in pharmacy: Pantoprazole 40 mg twice daily (may take omeprazole twice daily until this medication arrives)  If you have not heard from your mail in pharmacy within 1 week or if you have not received your medication in the mail, please contact us at [phone] so we may find out why.  You have been scheduled for an endoscopy. Please follow written instructions given to you at your visit today. If you use inhalers (even only as needed), please bring them with you on the day of your procedure. Your physician has requested that you go to www.startemmi.com and enter the access code given to you at your visit today. This web site gives a general overview about your procedure. However, you should still follow specific instructions given to you by our office regarding your preparation for the procedure.  You will be due for a recall colonoscopy in 09/2018. We will send you a reminder in the mail when it gets closer to that time.  Your physician has requested that you go to the basement for the following lab work before leaving today: CBC, IBC, Ferritin  Food Choices for Gastroesophageal Reflux Disease, Adult When you have gastroesophageal reflux disease (GERD), the foods you eat and your eating habits are very important. Choosing the right foods can help ease the discomfort of GERD. WHAT GENERAL GUIDELINES DO I NEED TO FOLLOW?  Choose fruits, vegetables, whole grains, low-fat dairy products, and low-fat meat, fish, and poultry.  Limit fats such as oils, salad dressings, butter, nuts, and avocado.  Keep a food diary to identify foods that cause symptoms.  Avoid foods that cause reflux. These may be different for different people.  Eat frequent small meals instead of three large meals each day.  Eat your meals slowly, in a relaxed setting.  Limit fried foods.  Cook foods using methods other than frying.  Avoid drinking alcohol.  Avoid  drinking large amounts of liquids with your meals.  Avoid bending over or lying down until 2-3 hours after eating. WHAT FOODS ARE NOT RECOMMENDED? The following are some foods and drinks that may worsen your symptoms: Vegetables Tomatoes. Tomato juice. Tomato and spaghetti sauce. Chili peppers. Onion and garlic. Horseradish. Fruits Oranges, grapefruit, and lemon (fruit and juice). Meats High-fat meats, fish, and poultry. This includes hot dogs, ribs, ham, sausage, salami, and bacon. Dairy Whole milk and chocolate milk. Sour cream. Cream. Butter. Ice cream. Cream cheese.  Beverages Coffee and tea, with or without caffeine. Carbonated beverages or energy drinks. Condiments Hot sauce. Barbecue sauce.  Sweets/Desserts Chocolate and cocoa. Donuts. Peppermint and spearmint. Fats and Oils High-fat foods, including [place] fries and potato chips. Other Vinegar. Strong spices, such as black pepper, white pepper, red pepper, cayenne, curry powder, cloves, ginger, and chili powder. The items listed above may not be a complete list of foods and beverages to avoid. Contact your dietitian for more information.   This information is not intended to replace advice given to you by your health care provider. Make sure you discuss any questions you have with your health care provider.   Document Released: 09/28/2005 Document Revised: 10/19/2014 Document Reviewed: 08/02/2013 Elsevier Interactive Patient Education 2016 [employer].   Gastroesophageal Reflux Disease, Adult Normally, food travels down the esophagus and stays in the stomach to be digested. However, when a person has gastroesophageal reflux disease (GERD), food and stomach acid move back up into the esophagus. When this happens, the esophagus becomes sore and  inflamed. Over time, GERD can create small holes (ulcers) in the lining of the esophagus.  CAUSES This condition is caused by a problem with the muscle between the esophagus and the  stomach (lower esophageal sphincter, or LES). Normally, the LES muscle closes after food passes through the esophagus to the stomach. When the LES is weakened or abnormal, it does not close properly, and that allows food and stomach acid to go back up into the esophagus. The LES can be weakened by certain dietary substances, medicines, and medical conditions, including:  Tobacco use.  Pregnancy.  Having a hiatal hernia.  Heavy alcohol use.  Certain foods and beverages, such as coffee, chocolate, onions, and peppermint. RISK FACTORS This condition is more likely to develop in:  People who have an increased body weight.  People who have connective tissue disorders.  People who use NSAID medicines. SYMPTOMS Symptoms of this condition include:  Heartburn.  Difficult or painful swallowing.  The feeling of having a lump in the throat.  Abitter taste in the mouth.  Bad breath.  Having a large amount of saliva.  Having an upset or bloated stomach.  Belching.  Chest pain.  Shortness of breath or wheezing.  Ongoing (chronic) cough or a night-time cough.  Wearing away of tooth enamel.  Weight loss. Different conditions can cause chest pain. Make sure to see your health care provider if you experience chest pain. DIAGNOSIS Your health care provider will take a medical history and perform a physical exam. To determine if you have mild or severe GERD, your health care provider may also monitor how you respond to treatment. You may also have other tests, including:  An endoscopy toexamine your stomach and esophagus with a small camera.  A test thatmeasures the acidity level in your esophagus.  A test thatmeasures how much pressure is on your esophagus.  A barium swallow or modified barium swallow to show the shape, size, and functioning of your esophagus. TREATMENT The goal of treatment is to help relieve your symptoms and to prevent complications. Treatment for this  condition may vary depending on how severe your symptoms are. Your health care provider may recommend:  Changes to your diet.  Medicine.  Surgery. HOME CARE INSTRUCTIONS Diet  Follow a diet as recommended by your health care provider. This may involve avoiding foods and drinks such as:  Coffee and tea (with or without caffeine).  Drinks that containalcohol.  Energy drinks and sports drinks.  Carbonated drinks or sodas.  Chocolate and cocoa.  Peppermint and mint flavorings.  Garlic and onions.  Horseradish.  Spicy and acidic foods, including peppers, chili powder, curry powder, vinegar, hot sauces, and barbecue sauce.  Citrus fruit juices and citrus fruits, such as oranges, lemons, and limes.  Tomato-based foods, such as red sauce, chili, salsa, and pizza with red sauce.  Fried and fatty foods, such as donuts, french fries, potato chips, and high-fat dressings.  High-fat meats, such as hot dogs and fatty cuts of red and white meats, such as rib eye steak, sausage, ham, and bacon.  High-fat dairy items, such as whole milk, butter, and cream cheese.  Eat small, frequent meals instead of large meals.  Avoid drinking large amounts of liquid with your meals.  Avoid eating meals during the 2-3 hours before bedtime.  Avoid lying down right after you eat.  Do not exercise right after you eat. General Instructions  Pay attention to any changes in your symptoms.  Take over-the-counter and prescription medicines  only as told by your health care provider. Do not take aspirin, ibuprofen, or other NSAIDs unless your health care provider told you to do so.  Do not use any tobacco products, including cigarettes, chewing tobacco, and e-cigarettes. If you need help quitting, ask your health care provider.  Wear loose-fitting clothing. Do not wear anything tight around your waist that causes pressure on your abdomen.  Raise (elevate) the head of your bed 6 inches  (15cm).  Try to reduce your stress, such as with yoga or meditation. If you need help reducing stress, ask your health care provider.  If you are overweight, reduce your weight to an amount that is healthy for you. Ask your health care provider for guidance about a safe weight loss goal.  Keep all follow-up visits as told by your health care provider. This is important. SEEK MEDICAL CARE IF:  You have new symptoms.  You have unexplained weight loss.  You have difficulty swallowing, or it hurts to swallow.  You have wheezing or a persistent cough.  Your symptoms do not improve with treatment.  You have a hoarse voice. SEEK IMMEDIATE MEDICAL CARE IF:  You have pain in your arms, neck, jaw, teeth, or back.  You feel sweaty, dizzy, or light-headed.  You have chest pain or shortness of breath.  You vomit and your vomit looks like blood or coffee grounds.  You faint.  Your stool is bloody or black.  You cannot swallow, drink, or eat.   This information is not intended to replace advice given to you by your health care provider. Make sure you discuss any questions you have with your health care provider.   Document Released: 07/08/2005 Document Revised: 06/19/2015 Document Reviewed: 01/23/2015 Elsevier Interactive Patient Education [employer].

## 2015-11-06 ENCOUNTER — Other Ambulatory Visit: Payer: Self-pay

## 2015-11-06 DIAGNOSIS — D509 Iron deficiency anemia, unspecified: Secondary | ICD-10-CM

## 2015-11-07 ENCOUNTER — Encounter: Payer: Self-pay | Admitting: Internal Medicine

## 2015-11-07 ENCOUNTER — Ambulatory Visit (AMBULATORY_SURGERY_CENTER): Payer: 59 | Admitting: Internal Medicine

## 2015-11-07 ENCOUNTER — Other Ambulatory Visit: Payer: Self-pay

## 2015-11-07 ENCOUNTER — Telehealth: Payer: Self-pay

## 2015-11-07 VITALS — BP 127/69 | HR 73 | Temp 97.9°F | Resp 16 | Ht 60.0 in | Wt 163.0 lb

## 2015-11-07 DIAGNOSIS — D509 Iron deficiency anemia, unspecified: Secondary | ICD-10-CM

## 2015-11-07 DIAGNOSIS — K219 Gastro-esophageal reflux disease without esophagitis: Secondary | ICD-10-CM

## 2015-11-07 DIAGNOSIS — K259 Gastric ulcer, unspecified as acute or chronic, without hemorrhage or perforation: Secondary | ICD-10-CM | POA: Diagnosis not present

## 2015-11-07 DIAGNOSIS — K3189 Other diseases of stomach and duodenum: Secondary | ICD-10-CM | POA: Diagnosis not present

## 2015-11-07 MED ORDER — SODIUM CHLORIDE 0.9 % IV SOLN: 500.0000 mL | INTRAVENOUS | Status: DC

## 2015-11-07 NOTE — Op Note (Signed)
[place]  [address] [place], [id number]   ENDOSCOPY PROCEDURE REPORT  PATIENT: [name], [name]  MR#: [id number] BIRTHDATE: 1956-09-08 , 86  yrs. old GENDER: female ENDOSCOPIST: Jerene Bears, MD REFERRED BY:  Donavan Foil, M.D. PROCEDURE DATE:  11/07/2015 PROCEDURE:  EGD, diagnostic and EGD w/ biopsy ASA CLASS:     Class II INDICATIONS:  heartburn, epigastric pain, and iron deficiency anemia. MEDICATIONS: Monitored anesthesia care and Propofol 200 mg IV TOPICAL ANESTHETIC: none  DESCRIPTION OF PROCEDURE: After the risks benefits and alternatives of the procedure were thoroughly explained, informed consent was obtained.  The [id number] endoscope was introduced through the mouth and advanced to the second portion of the duodenum , Without limitations.  The instrument was slowly withdrawn as the mucosa was fully examined.  ESOPHAGUS: The z-line was located 36cm from the incisors and appeared irregular and biopsies were taken.  At the [facility] and immediately distal in the gastric cardia there was inflammation and whitish exudate.  Multiple biopsies taken from this region.  STOMACH: Moderate striped gastritis (inflammation) was found in the prepyloric region of the stomach and gastric antrum.  Cold forcep biopsies were taken at the gastric body, antrum and angularis to evaluate for h.  pylori.  DUODENUM: The duodenal mucosa showed no abnormalities in the bulb and 2nd part of the duodenum.  Retroflexed views revealed no abnormalities without definitive hiatal hernia.  The scope was then withdrawn from the patient and the procedure completed.  COMPLICATIONS: There were no immediate complications.  ENDOSCOPIC IMPRESSION: 1.   The z-line was irregular and located 36cm from the incisors; multiple biopsies.  Inflammation at [facility] and immediately distal in the gastric cardia; multiple biopsies 2.   Gastritis (inflammation) was found in the prepyloric  region of the stomach and gastric antrum; multiple biopsies 3.   The duodenal mucosa showed no abnormalities in the bulb and 2nd part of the duodenum  RECOMMENDATIONS: 1.  Await biopsy results 2.  Continue pantoprazole 40 mg twice daily 3.  Continue iron replacement 4.  Perform upper GI series to evaluate for possible sliding hernia  eSigned:  Jerene Bears, MD 11/07/2015 9:34 AM CC: the patient, Dr. Loanne Drilling

## 2015-11-07 NOTE — Progress Notes (Signed)
Called to room to assist during endoscopic procedure.  Patient ID and intended procedure confirmed with present staff. Received instructions for my participation in the procedure from the performing physician.  

## 2015-11-07 NOTE — Telephone Encounter (Signed)
Pt aware of appt.

## 2015-11-07 NOTE — Patient Instructions (Signed)
YOU HAD AN ENDOSCOPIC PROCEDURE TODAY AT [facility]:   Refer to the procedure report that was given to you for any specific questions about what was found during the examination.  If the procedure report does not answer your questions, please call your gastroenterologist to clarify.  If you requested that your care partner not be given the details of your procedure findings, then the procedure report has been included in a sealed envelope for you to review at your convenience later.  YOU SHOULD EXPECT: Some feelings of bloating in the abdomen. Passage of more gas than usual.  Walking can help get rid of the air that was put into your GI tract during the procedure and reduce the bloating.  Please Note:  You might notice some irritation and congestion in your nose or some drainage.  This is from the oxygen used during your procedure.  There is no need for concern and it should clear up in a day or so.  SYMPTOMS TO REPORT IMMEDIATELY:    Following upper endoscopy (EGD)  Vomiting of blood or coffee ground material  New chest pain or pain under the shoulder blades  Painful or persistently difficult swallowing  New shortness of breath  Fever of 100F or higher  Black, tarry-looking stools  For urgent or emergent issues, a gastroenterologist can be reached at any hour by calling [phone].   DIET: Your first meal following the procedure should be a small meal and then it is ok to progress to your normal diet. Heavy or fried foods are harder to digest and may make you feel nauseous or bloated.  Likewise, meals heavy in dairy and vegetables can increase bloating.  Drink plenty of fluids but you should avoid alcoholic beverages for 24 hours.  ACTIVITY:  You should plan to take it easy for the rest of today and you should NOT DRIVE or use heavy machinery until tomorrow (because of the sedation medicines used during the test).    FOLLOW UP: Our staff will call the number listed  on your records the next business day following your procedure to check on you and address any questions or concerns that you may have regarding the information given to you following your procedure. If we do not reach you, we will leave a message.  However, if you are feeling well and you are not experiencing any problems, there is no need to return our call.  We will assume that you have returned to your regular daily activities without incident.  If any biopsies were taken you will be contacted by phone or by letter within the next 1-3 weeks.  Please call us at [phone] if you have not heard about the biopsies in 3 weeks.   SIGNATURES/CONFIDENTIALITY: You and/or your care partner have signed paperwork which will be entered into your electronic medical record.  These signatures attest to the fact that that the information above on your After Visit Summary has been reviewed and is understood.  Full responsibility of the confidentiality of this discharge information lies with you and/or your care-partner.  Continue your normal medications- including Pantoprazole, Iron  Dr. Vena Rua office will call you to set up an upper GI series   Please read over handout about gastritis

## 2015-11-07 NOTE — Progress Notes (Signed)
Report to PACU, RN, vss, BBS= Clear.  

## 2015-11-07 NOTE — Telephone Encounter (Signed)
Pt scheduled for Upper GI series at [facility] to eval sliding hernia 11/13/15@10am , pt to arrive there at 9:45am and be NPO after midnight. Left message for pt to call back.

## 2015-11-07 NOTE — Progress Notes (Signed)
Pt has large bruise on left foot from dropping a book on it at home.pt wanted [place] to be ware of bruise.

## 2015-11-08 ENCOUNTER — Telehealth: Payer: Self-pay

## 2015-11-08 NOTE — Telephone Encounter (Signed)
  Follow up Call-  Call back number 11/07/2015  Post procedure Call Back phone  # [phone]  Permission to leave phone message Yes     Patient was called for follow up after procedure on 11/07/2015. No answer at the number given for follow up call. A message was left on the answering machine.

## 2015-11-13 ENCOUNTER — Ambulatory Visit (HOSPITAL_COMMUNITY)
Admission: RE | Admit: 2015-11-13 | Discharge: 2015-11-13 | Disposition: A | Discharge status: Home / Self Care | Payer: 59 | Source: Ambulatory Visit | Attending: Internal Medicine | Admitting: Internal Medicine

## 2015-11-13 ENCOUNTER — Encounter: Payer: Self-pay | Admitting: Internal Medicine

## 2015-11-13 DIAGNOSIS — K449 Diaphragmatic hernia without obstruction or gangrene: Secondary | ICD-10-CM | POA: Insufficient documentation

## 2015-11-13 DIAGNOSIS — K2970 Gastritis, unspecified, without bleeding: Secondary | ICD-10-CM | POA: Diagnosis present

## 2015-11-13 DIAGNOSIS — K219 Gastro-esophageal reflux disease without esophagitis: Secondary | ICD-10-CM

## 2015-11-29 ENCOUNTER — Other Ambulatory Visit: Payer: Self-pay | Admitting: Endocrinology

## 2015-12-11 ENCOUNTER — Other Ambulatory Visit: Payer: Self-pay | Admitting: *Deleted

## 2015-12-11 ENCOUNTER — Encounter: Payer: 59 | Admitting: Registered Nurse

## 2015-12-11 DIAGNOSIS — F0781 Postconcussional syndrome: Secondary | ICD-10-CM

## 2015-12-11 DIAGNOSIS — M47817 Spondylosis without myelopathy or radiculopathy, lumbosacral region: Secondary | ICD-10-CM

## 2015-12-11 DIAGNOSIS — F411 Generalized anxiety disorder: Secondary | ICD-10-CM

## 2015-12-11 MED ORDER — ALPRAZOLAM 0.5 MG PO TABS: 0.5000 mg | ORAL_TABLET | Freq: Three times a day (TID) | ORAL | Status: DC | PRN

## 2015-12-11 MED ORDER — HYDROCODONE-ACETAMINOPHEN 10-325 MG PO TABS: 1.0000 | ORAL_TABLET | Freq: Four times a day (QID) | ORAL | Status: DC | PRN

## 2015-12-11 MED ORDER — METHYLPHENIDATE HCL 10 MG PO TABS: 10.0000 mg | ORAL_TABLET | Freq: Two times a day (BID) | ORAL | Status: DC

## 2016-01-01 ENCOUNTER — Encounter: Payer: Self-pay | Admitting: Internal Medicine

## 2016-01-01 ENCOUNTER — Ambulatory Visit (INDEPENDENT_AMBULATORY_CARE_PROVIDER_SITE_OTHER): Payer: 59 | Admitting: Internal Medicine

## 2016-01-01 VITALS — BP 128/74 | HR 80 | Ht 65.0 in | Wt 164.1 lb

## 2016-01-01 DIAGNOSIS — D509 Iron deficiency anemia, unspecified: Secondary | ICD-10-CM | POA: Diagnosis not present

## 2016-01-01 DIAGNOSIS — K2970 Gastritis, unspecified, without bleeding: Secondary | ICD-10-CM | POA: Diagnosis not present

## 2016-01-01 DIAGNOSIS — K2100 Gastro-esophageal reflux disease with esophagitis, without bleeding: Secondary | ICD-10-CM

## 2016-01-01 DIAGNOSIS — Z86010 Personal history of colonic polyps: Secondary | ICD-10-CM

## 2016-01-01 MED ORDER — RANITIDINE HCL 150 MG PO CAPS: 150.0000 mg | ORAL_CAPSULE | Freq: Every evening | ORAL | Status: DC

## 2016-01-01 NOTE — Progress Notes (Signed)
Subjective:    Patient ID: [name], female    DOB: 05/01/1956, 60 y.o.   MRN: [id number]  HPI [name] is a 60 yo female with PMH of GERD with hiatal hernia, IDA, adenomatous colon polyps and family history of colon cancer who presents for follow-up. She was last seen in the office on 11/05/2015 at which time she was having uncontrolled reflux despite omeprazole 40 mg daily. She came for upper endoscopy which was performed on 11/07/2015. This revealed an irregular Z line and inflammation at the GE junction and immediately distal in the gastric cardia. This was biopsied extensively. Antral gastritis was found in the duodenum was unremarkable. Antral biopsy showed reactive gastropathy negative for H. pylori and metaplasia. GE junction biopsies and cardia biopsies showed ulcerated mucosa with reactive changes. No H. pylori or dysplasia. She was maintained on pantoprazole 40 mg twice a day since initial appointment. This has dramatically improved her heartburn and regurgitation symptoms. Symptoms are not 100% better but nearly. She has noticed that with the evening dose of pantoprazole she is having some nausea around 9 or 10:00 at night. No vomiting. She is sleeping better and not waking up with water brash as frequently. She's also noticed less cough. No dysphagia or odynophagia.  She did have a barium esophagram which was performed after upper endoscopy which showed sliding hiatal hernia which was small. Moderate to severe gastro-esophageal reflux. Study reviewed.   Bowel movements have remained normal without blood or melena. No diarrhea or constipation. She has been more tired recently but notes that she has several friends that have cancer one of which is currently in hospice care. She does admit to more stress related to this situation She continues on oral iron daily  Review of Systems As per history of present illness, otherwise negative  Current Medications, Allergies, Past  Medical History, Past Surgical History, Family History and Social History were reviewed in [employer] record.     Objective:   Physical Exam BP 128/74 mmHg  Pulse 80  Ht 5\' 5"  (1.651 m)  Wt 164 lb 2 oz (74.447 kg)  BMI 27.31 kg/m2 Constitutional: Well-developed and well-nourished. No distress. HEENT: Normocephalic and atraumatic.Conjunctivae are normal.  No scleral icterus. Neck: Neck supple. Trachea midline. Cardiovascular: Normal rate, regular rhythm and intact distal pulses. No M/R/G Pulmonary/chest: Effort normal and breath sounds normal. No wheezing, rales or rhonchi. Abdominal: Soft, nontender, nondistended. Bowel sounds active throughout.  Extremities: no clubbing, cyanosis, or edema Neurological: Alert and oriented to person place and time. Skin: Skin is warm and dry.  Psychiatric: Normal mood and affect. Behavior is normal.  CBC    Component Value Date/Time   WBC 5.0 11/05/2015 1431   RBC 4.89 11/05/2015 1431   HGB 12.8 11/05/2015 1431   HCT 39.1 11/05/2015 1431   PLT 239.0 11/05/2015 1431   MCV 79.9 11/05/2015 1431   MCH 24.4* 06/12/2015 1742   MCHC 32.7 11/05/2015 1431   RDW 15.2 11/05/2015 1431   LYMPHSABS 1.5 11/05/2015 1431   MONOABS 0.3 11/05/2015 1431   EOSABS 0.0 11/05/2015 1431   BASOSABS 0.0 11/05/2015 1431    CMP     Component Value Date/Time   NA 139 06/25/2015 1402   K 3.5 06/25/2015 1402   CL 99 06/25/2015 1402   CO2 32 06/25/2015 1402   GLUCOSE 98 06/25/2015 1402   BUN 14 06/25/2015 1402   CREATININE 0.78 06/25/2015 1402   CREATININE 0.86 03/22/2014 1438   CALCIUM  9.5 06/25/2015 1402   PROT 7.2 06/25/2015 1402   ALBUMIN 4.6 06/25/2015 1402   AST 19 06/25/2015 1402   ALT 13 06/25/2015 1402   ALKPHOS 80 06/25/2015 1402   BILITOT 0.4 06/25/2015 1402   GFRNONAA >60 06/12/2015 1742   GFRNONAA 76 03/22/2014 1438   GFRAA >60 06/12/2015 1742   GFRAA 87 03/22/2014 1438       Assessment & Plan:  60 yo female with  PMH of GERD with hiatal hernia, IDA, adenomatous colon polyps and family history of colon cancer who presents for follow-up.   1.  GERD with Esophagitis and gastritis -- symptoms have improved on twice daily pantoprazole. We've reviewed an antireflux diet and she's already made positive lifestyle and dietary changes which have benefited her symptoms. We are going to stop the evening dose of pantoprazole as this may be causing some nausea. Prescription for ranitidine 150 every afternoon or daily at bedtime on an as-needed basis. She can take this daily if necessary. No evidence of Barrett's esophagus.  2. IDA -- she is continuing on oral iron. Dr. Loanne Drilling, her primary physician, is monitoring hemoglobin and iron studies.  3. History of adenoma of the colon/family history of colon cancer -- repeat colonoscopy recommended December 2019  She also told me that she has trouble swallowing her acyclovir due to the size of the tablet. This tablet can be crushed as needed.  6 month follow-up, sooner if necessary 25 minutes spent with the patient today. Greater than 50% was spent in counseling and coordination of care with the patient

## 2016-01-01 NOTE — Patient Instructions (Signed)
Decrease protonix  to 40 mg in the AM Use Ranitidine 150 mg every night at bedtime as needed You can crush Acyclovir given its hard to swallow  Follow up in 6 months

## 2016-01-08 ENCOUNTER — Encounter: Payer: 59 | Attending: Physical Medicine and Rehabilitation | Admitting: Registered Nurse

## 2016-01-08 ENCOUNTER — Encounter: Payer: Self-pay | Admitting: Registered Nurse

## 2016-01-08 VITALS — BP 132/58 | HR 85 | Resp 14

## 2016-01-08 DIAGNOSIS — Z79899 Other long term (current) drug therapy: Secondary | ICD-10-CM

## 2016-01-08 DIAGNOSIS — M461 Sacroiliitis, not elsewhere classified: Secondary | ICD-10-CM | POA: Insufficient documentation

## 2016-01-08 DIAGNOSIS — F0781 Postconcussional syndrome: Secondary | ICD-10-CM

## 2016-01-08 DIAGNOSIS — M47817 Spondylosis without myelopathy or radiculopathy, lumbosacral region: Secondary | ICD-10-CM | POA: Insufficient documentation

## 2016-01-08 DIAGNOSIS — F411 Generalized anxiety disorder: Secondary | ICD-10-CM | POA: Diagnosis present

## 2016-01-08 DIAGNOSIS — Z5181 Encounter for therapeutic drug level monitoring: Secondary | ICD-10-CM | POA: Diagnosis not present

## 2016-01-08 DIAGNOSIS — G894 Chronic pain syndrome: Secondary | ICD-10-CM

## 2016-01-08 DIAGNOSIS — M961 Postlaminectomy syndrome, not elsewhere classified: Secondary | ICD-10-CM

## 2016-01-08 MED ORDER — METHYLPHENIDATE HCL 10 MG PO TABS: 10.0000 mg | ORAL_TABLET | Freq: Two times a day (BID) | ORAL | Status: DC

## 2016-01-08 MED ORDER — HYDROCODONE-ACETAMINOPHEN 10-325 MG PO TABS: 1.0000 | ORAL_TABLET | Freq: Four times a day (QID) | ORAL | Status: DC | PRN

## 2016-01-08 NOTE — Progress Notes (Signed)
Subjective:    Patient ID: [name], female    DOB: 02-14-1956, 60 y.o.   MRN: [id number]  HPI: [name] is a 60 year old female who returns for follow up for chronic pain and medication refill. She states her pain is located in her lower back and right hip. She rates her pain 8. Her current exercise regime is walking and performing stretching exercises.  Pain Inventory Average Pain 8 Pain Right Now 8 My pain is constant, sharp, burning, dull, stabbing, tingling and aching  In the last 24 hours, has pain interfered with the following? General activity 9 Relation with others 8 Enjoyment of life 9 What TIME of day is your pain at its worst? all Sleep (in general) Fair  Pain is worse with: walking, bending, sitting, inactivity, standing, unsure and some activites Pain improves with: rest, heat/ice, pacing activities and medication Relief from Meds: 8  Mobility walk with assistance use a cane how many minutes can you walk? 30-60 ability to climb steps?  yes do you drive?  yes Do you have any goals in this area?  yes  Function not employed: date last employed . disabled: date disabled . I need assistance with the following:  dressing, meal prep, household duties and shopping  Neuro/Psych bladder control problems weakness numbness tremor tingling spasms dizziness confusion depression anxiety loss of taste or smell  Prior Studies Any changes since last visit?  no  Physicians involved in your care Any changes since last visit?  no   Family History  Problem Relation Age of Onset  . Cervical cancer Mother   . Diabetes Mother   . Stroke Mother   . Heart attack Father   . Stroke Father   . Colon cancer Father   . Anxiety disorder Maternal Aunt   . Depression Maternal Aunt   . Anxiety disorder Maternal Uncle   . Depression Maternal Uncle     suicide attempt by gun shot wound to head. Survived  . Alcohol abuse Other   . Drug abuse Other   .  Breast cancer Maternal Grandmother   . Diabetes Maternal Grandmother   . Breast cancer Paternal Aunt   . Colon polyps Sister     x 2   Social History   Social History  . Marital Status: Married    Spouse Name: N/A  . Number of Children: 0  . Years of Education: N/A   Occupational History  . disabled    Social History Main Topics  . Smoking status: Never Smoker   . Smokeless tobacco: Never Used  . Alcohol Use: No  . Drug Use: No  . Sexual Activity: Yes   Other Topics Concern  . None   Social History Narrative   Pt has h.s. degree   Past Surgical History  Procedure Laterality Date  . Abdominal hysterectomy  1999  . Tonsillectomy  1981  . Tubal ligation  1997  . Bunions removed  1988  . Edg  12/17/2004  . Electrocardiogram  05/27/2007  . Skin cancer excision    . Cholecystectomy  04/25/2008  . C4 c5 cage insertion  06/28/2013  . L5 s1 rod insertion  11/07/2012  . Skin cancer excision      several   Past Medical History  Diagnosis Date  . ANXIETY 06/10/2007  . ASYMPTOMATIC POSTMENOPAUSAL STATUS 07/19/2009  . Cough 04/02/2009  . DYSLIPIDEMIA 06/28/2008  . GERD 06/10/2007  . 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 HYPERGLYCEMIA 06/28/2008  . HYPERTENSION  06/10/2007  . Irritable bowel syndrome 06/28/2008  . OSTEOARTHRITIS 06/10/2007  . UPPER RESPIRATORY INFECTION 03/26/2009  . WEIGHT GAIN 01/21/2010  . Degenerative arthritis of spine     back and neck  . Tubular adenoma of colon   . Hiatal hernia   . Esophageal stricture   . Anemia   . Skin cancer     basal cell  . Chronic headaches   . Depression   . Fibromyalgia   . Gallstones   . Pneumonia    BP 132/58 mmHg  Pulse 85  Resp 14  SpO2 97%  Opioid Risk Score:   Fall Risk Score:  `1  Depression screen PHQ 2/9  Depression screen [facility] 2/9 06/10/2015 01/02/2015  Decreased Interest 1 3  Down, Depressed, Hopeless 1 1  PHQ - 2 Score 2 4  Altered sleeping - 3  Tired, decreased energy - 2  Change in appetite - 2  Feeling bad or failure  about yourself  - 1  Trouble concentrating - 3  Moving slowly or fidgety/restless - 2  Suicidal thoughts - 1  PHQ-9 Score - 18    Review of Systems  Cardiovascular: Positive for leg swelling.  Gastrointestinal: Positive for diarrhea.  All other systems reviewed and are negative.      Objective:   Physical Exam  Constitutional: She is oriented to person, place, and time. She appears well-developed and well-nourished.  HENT:  Head: Normocephalic and atraumatic.  Neck: Normal range of motion. Neck supple.  Cardiovascular: Normal rate and regular rhythm.   Pulmonary/Chest: Effort normal and breath sounds normal.  Musculoskeletal:  Normal Muscle Bulk and Muscle Testing Reveals: Upper Extremities: Full ROM and Muscle Strength 5/5 Lumbar Paraspinal Tenderness: L-3- L-5 Right Greater Trochanteric Tenderness Lower Extremities: Full ROM and Muscle Strength 5/5 Arises from chair with ease, using straight cane for support Narrow Based Gait  Neurological: She is alert and oriented to person, place, and time.  Skin: Skin is warm and dry.  Psychiatric: She has a normal mood and affect.  Nursing note and vitals reviewed.         Assessment & Plan:  1. History of chronic lumbar facet disease with spondylosis/DDD/ L4-5 spondylolisthesis.With L4-5 L5-S1 decompression stabilization: Refilled: Hydrocodone 10/325mg  one tablet every 6 hours as needed #120.  2. Depression with anxiety: Continue Xanax, and Effexor. F/U with Dr. Valentina Shaggy. 3. Cervical spondylosis: Stable at this time with no complaints. Continue Medication Regime.  4. Post Concussion Syndrome: Continue Ritalin 10 mg one tablet twice a day 0700 and noon #60. 5. Insomnia: Continue Elavil  20 minutes of face to face patient care time was spent during this visit. All questions were encouraged and answered.   F/U in 1 month

## 2016-01-12 LAB — TOXASSURE SELECT,+ANTIDEPR,UR: PDF: 0

## 2016-01-14 NOTE — Progress Notes (Signed)
Urine drug screen for this encounter is consistent for prescribed medication 

## 2016-01-20 ENCOUNTER — Telehealth: Payer: Self-pay

## 2016-01-20 NOTE — Telephone Encounter (Signed)
-----   Message from Algernon Huxley, RN sent at 11/06/2015  3:02 PM EST ----- Regarding: labs Pt needs labs in 3 mth, orders in epic.

## 2016-01-20 NOTE — Telephone Encounter (Signed)
Left message for pt to call back  °

## 2016-01-22 NOTE — Telephone Encounter (Signed)
Letter mailed to pt to come for labs. 

## 2016-01-30 ENCOUNTER — Other Ambulatory Visit (INDEPENDENT_AMBULATORY_CARE_PROVIDER_SITE_OTHER): Payer: 59

## 2016-01-30 DIAGNOSIS — D509 Iron deficiency anemia, unspecified: Secondary | ICD-10-CM

## 2016-01-30 LAB — IBC PANEL
IRON: 118 ug/dL (ref 42–145)
Saturation Ratios: 36 % (ref 20.0–50.0)
Transferrin: 234 mg/dL (ref 212.0–360.0)

## 2016-01-30 LAB — CBC WITH DIFFERENTIAL/PLATELET
BASOS ABS: 0 10*3/uL (ref 0.0–0.1)
BASOS PCT: 0.2 % (ref 0.0–3.0)
EOS ABS: 0.1 10*3/uL (ref 0.0–0.7)
Eosinophils Relative: 1.6 % (ref 0.0–5.0)
HEMATOCRIT: 39 % (ref 36.0–46.0)
HEMOGLOBIN: 13.2 g/dL (ref 12.0–15.0)
LYMPHS PCT: 28.7 % (ref 12.0–46.0)
Lymphs Abs: 1.2 10*3/uL (ref 0.7–4.0)
MCHC: 33.7 g/dL (ref 30.0–36.0)
MCV: 82.5 fl (ref 78.0–100.0)
MONOS PCT: 5.6 % (ref 3.0–12.0)
Monocytes Absolute: 0.2 10*3/uL (ref 0.1–1.0)
NEUTROS PCT: 63.9 % (ref 43.0–77.0)
Neutro Abs: 2.8 10*3/uL (ref 1.4–7.7)
PLATELETS: 191 10*3/uL (ref 150.0–400.0)
RBC: 4.73 Mil/uL (ref 3.87–5.11)
RDW: 14.8 % (ref 11.5–15.5)
WBC: 4.3 10*3/uL (ref 4.0–10.5)

## 2016-01-30 LAB — FERRITIN: FERRITIN: 30 ng/mL (ref 10.0–291.0)

## 2016-02-03 ENCOUNTER — Other Ambulatory Visit: Payer: Self-pay

## 2016-02-03 DIAGNOSIS — D509 Iron deficiency anemia, unspecified: Secondary | ICD-10-CM

## 2016-02-07 ENCOUNTER — Encounter: Payer: 59 | Attending: Physical Medicine and Rehabilitation | Admitting: Registered Nurse

## 2016-02-07 ENCOUNTER — Encounter: Payer: Self-pay | Admitting: Registered Nurse

## 2016-02-07 VITALS — BP 131/61 | HR 96 | Resp 14

## 2016-02-07 DIAGNOSIS — F0781 Postconcussional syndrome: Secondary | ICD-10-CM

## 2016-02-07 DIAGNOSIS — M961 Postlaminectomy syndrome, not elsewhere classified: Secondary | ICD-10-CM | POA: Diagnosis not present

## 2016-02-07 DIAGNOSIS — F411 Generalized anxiety disorder: Secondary | ICD-10-CM | POA: Diagnosis not present

## 2016-02-07 DIAGNOSIS — F4310 Post-traumatic stress disorder, unspecified: Secondary | ICD-10-CM

## 2016-02-07 DIAGNOSIS — G894 Chronic pain syndrome: Secondary | ICD-10-CM | POA: Diagnosis not present

## 2016-02-07 DIAGNOSIS — M47817 Spondylosis without myelopathy or radiculopathy, lumbosacral region: Secondary | ICD-10-CM | POA: Insufficient documentation

## 2016-02-07 DIAGNOSIS — Z79899 Other long term (current) drug therapy: Secondary | ICD-10-CM

## 2016-02-07 DIAGNOSIS — Z5181 Encounter for therapeutic drug level monitoring: Secondary | ICD-10-CM

## 2016-02-07 DIAGNOSIS — M461 Sacroiliitis, not elsewhere classified: Secondary | ICD-10-CM | POA: Diagnosis present

## 2016-02-07 DIAGNOSIS — M5416 Radiculopathy, lumbar region: Secondary | ICD-10-CM

## 2016-02-07 MED ORDER — METHYLPHENIDATE HCL 10 MG PO TABS: 10.0000 mg | ORAL_TABLET | Freq: Two times a day (BID) | ORAL | Status: DC

## 2016-02-07 MED ORDER — HYDROCODONE-ACETAMINOPHEN 10-325 MG PO TABS: 1.0000 | ORAL_TABLET | Freq: Four times a day (QID) | ORAL | Status: DC | PRN

## 2016-02-07 NOTE — Progress Notes (Signed)
Subjective:    Patient ID: [name], female    DOB: Apr 16, 1956, 60 y.o.   MRN: [id number]  HPI: [name] is a 60 year old female who returns for follow up for chronic pain and medication refill. She states her pain is located in her lower back radiating into her left hip. She rates her pain 9. Her current exercise regime is walking and performing stretching exercises.  Pain Inventory Average Pain 9 Pain Right Now 9 My pain is constant, sharp, burning, dull, stabbing, tingling and aching  In the last 24 hours, has pain interfered with the following? General activity 10 Relation with others 10 Enjoyment of life 10 What TIME of day is your pain at its worst? all Sleep (in general) Fair  Pain is worse with: walking, sitting, standing and some activites Pain improves with: rest, heat/ice, pacing activities and medication Relief from Meds: 8  Mobility walk with assistance use a cane use a walker ability to climb steps?  yes do you drive?  yes Do you have any goals in this area?  yes  Function not employed: date last employed . disabled: date disabled . I need assistance with the following:  dressing, meal prep, household duties and shopping Do you have any goals in this area?  yes  Neuro/Psych bladder control problems weakness numbness tremor tingling trouble walking spasms dizziness confusion depression anxiety loss of taste or smell  Prior Studies Any changes since last visit?  no  Physicians involved in your care Any changes since last visit?  no   Family History  Problem Relation Age of Onset  . Cervical cancer Mother   . Diabetes Mother   . Stroke Mother   . Heart attack Father   . Stroke Father   . Colon cancer Father   . Anxiety disorder Maternal Aunt   . Depression Maternal Aunt   . Anxiety disorder Maternal Uncle   . Depression Maternal Uncle     suicide attempt by gun shot wound to head. Survived  . Alcohol abuse Other   .  Drug abuse Other   . Breast cancer Maternal Grandmother   . Diabetes Maternal Grandmother   . Breast cancer Paternal Aunt   . Colon polyps Sister     x 2   Social History   Social History  . Marital Status: Married    Spouse Name: N/A  . Number of Children: 0  . Years of Education: N/A   Occupational History  . disabled    Social History Main Topics  . Smoking status: Never Smoker   . Smokeless tobacco: Never Used  . Alcohol Use: No  . Drug Use: No  . Sexual Activity: Yes   Other Topics Concern  . None   Social History Narrative   Pt has h.s. degree   Past Surgical History  Procedure Laterality Date  . Abdominal hysterectomy  1999  . Tonsillectomy  1981  . Tubal ligation  1997  . Bunions removed  1988  . Edg  12/17/2004  . Electrocardiogram  05/27/2007  . Skin cancer excision    . Cholecystectomy  04/25/2008  . C4 c5 cage insertion  06/28/2013  . L5 s1 rod insertion  11/07/2012  . Skin cancer excision      several   Past Medical History  Diagnosis Date  . ANXIETY 06/10/2007  . ASYMPTOMATIC POSTMENOPAUSAL STATUS 07/19/2009  . Cough 04/02/2009  . DYSLIPIDEMIA 06/28/2008  . GERD 06/10/2007  . 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  HYPERGLYCEMIA 06/28/2008  . HYPERTENSION 06/10/2007  . Irritable bowel syndrome 06/28/2008  . OSTEOARTHRITIS 06/10/2007  . UPPER RESPIRATORY INFECTION 03/26/2009  . WEIGHT GAIN 01/21/2010  . Degenerative arthritis of spine     back and neck  . Tubular adenoma of colon   . Hiatal hernia   . Esophageal stricture   . Anemia   . Skin cancer     basal cell  . Chronic headaches   . Depression   . Fibromyalgia   . Gallstones   . Pneumonia    BP 131/61 mmHg  Pulse 96  Resp 14  SpO2 96%  Opioid Risk Score:   Fall Risk Score:  `1  Depression screen PHQ 2/9  Depression screen [facility] 2/9 06/10/2015 01/02/2015  Decreased Interest 1 3  Down, Depressed, Hopeless 1 1  PHQ - 2 Score 2 4  Altered sleeping - 3  Tired, decreased energy - 2  Change in appetite - 2    Feeling bad or failure about yourself  - 1  Trouble concentrating - 3  Moving slowly or fidgety/restless - 2  Suicidal thoughts - 1  PHQ-9 Score - 18     Review of Systems  Constitutional: Positive for appetite change.  Respiratory: Positive for cough and shortness of breath.   Gastrointestinal: Positive for nausea, abdominal pain and diarrhea.  Skin: Positive for rash.  Hematological: Bruises/bleeds easily.  All other systems reviewed and are negative.      Objective:   Physical Exam  Constitutional: She is oriented to person, place, and time. She appears well-developed and well-nourished.  HENT:  Head: Normocephalic and atraumatic.  Neck: Normal range of motion. Neck supple.  Cardiovascular: Normal rate and regular rhythm.   Pulmonary/Chest: Effort normal and breath sounds normal.  Musculoskeletal:  Normal Muscle Bulk and Muscle Testing Reveals: Upper Extremities: Full ROM and Muscle Strength 5/5 Thoracic Paraspinal Tenderness: T-3- T-4 Lumbar Paraspinal Tenderness: L-3- L-5 Lower Extremities: Full ROM and Muscle Strength 5/5 Arises from chair with ease using straight cane for support Narrow Based Gait  Neurological: She is alert and oriented to person, place, and time.  Skin: Skin is warm and dry.  Psychiatric: She has a normal mood and affect.  Nursing note and vitals reviewed.         Assessment & Plan:  1. History of chronic lumbar facet disease with spondylosis/DDD/ L4-5 spondylolisthesis.With L4-5 L5-S1 decompression stabilization: Refilled: Hydrocodone 10/325mg  one tablet every 6 hours as needed #120.  We will continue the opioid monitoring program, this consists of regular clinic visits, examinations, urine drug screen, pill counts as well as use of [place] Controlled Substance Reporting System. 2. Depression with anxiety: Continue Xanax, and Effexor. F/U with Dr. Valentina Shaggy. 3. Cervical spondylosis: Stable at this time with no complaints. Continue  Medication Regime.  4. Post Concussion Syndrome: Continue Ritalin 10 mg one tablet twice a day 0700 and noon #60. 5. Insomnia: Continue Elavil  20 minutes of face to face patient care time was spent during this visit. All questions were encouraged and answered.   F/U in 1 month

## 2016-02-25 ENCOUNTER — Other Ambulatory Visit: Payer: Self-pay | Admitting: Endocrinology

## 2016-03-02 ENCOUNTER — Other Ambulatory Visit: Payer: Self-pay

## 2016-03-02 ENCOUNTER — Encounter: Payer: Self-pay | Admitting: Endocrinology

## 2016-03-02 MED ORDER — ACYCLOVIR 800 MG PO TABS: 800.0000 mg | ORAL_TABLET | Freq: Every day | ORAL | Status: DC

## 2016-03-10 ENCOUNTER — Encounter: Payer: Self-pay | Admitting: Registered Nurse

## 2016-03-10 ENCOUNTER — Encounter: Payer: 59 | Attending: Physical Medicine and Rehabilitation | Admitting: Registered Nurse

## 2016-03-10 VITALS — BP 135/67 | HR 90 | Resp 16

## 2016-03-10 DIAGNOSIS — M47817 Spondylosis without myelopathy or radiculopathy, lumbosacral region: Secondary | ICD-10-CM | POA: Diagnosis not present

## 2016-03-10 DIAGNOSIS — F411 Generalized anxiety disorder: Secondary | ICD-10-CM | POA: Insufficient documentation

## 2016-03-10 DIAGNOSIS — F0781 Postconcussional syndrome: Secondary | ICD-10-CM

## 2016-03-10 DIAGNOSIS — M461 Sacroiliitis, not elsewhere classified: Secondary | ICD-10-CM | POA: Diagnosis present

## 2016-03-10 DIAGNOSIS — G894 Chronic pain syndrome: Secondary | ICD-10-CM

## 2016-03-10 DIAGNOSIS — M961 Postlaminectomy syndrome, not elsewhere classified: Secondary | ICD-10-CM

## 2016-03-10 DIAGNOSIS — Z5181 Encounter for therapeutic drug level monitoring: Secondary | ICD-10-CM

## 2016-03-10 DIAGNOSIS — M5416 Radiculopathy, lumbar region: Secondary | ICD-10-CM | POA: Diagnosis not present

## 2016-03-10 DIAGNOSIS — Z79899 Other long term (current) drug therapy: Secondary | ICD-10-CM

## 2016-03-10 MED ORDER — ALPRAZOLAM 0.5 MG PO TABS: 0.5000 mg | ORAL_TABLET | Freq: Three times a day (TID) | ORAL | Status: DC | PRN

## 2016-03-10 MED ORDER — METHYLPHENIDATE HCL 10 MG PO TABS: 10.0000 mg | ORAL_TABLET | Freq: Two times a day (BID) | ORAL | Status: DC

## 2016-03-10 MED ORDER — HYDROCODONE-ACETAMINOPHEN 10-325 MG PO TABS: 1.0000 | ORAL_TABLET | Freq: Four times a day (QID) | ORAL | Status: DC | PRN

## 2016-03-10 NOTE — Progress Notes (Signed)
Subjective:    Patient ID: [name], female    DOB: 1956/02/16, 60 y.o.   MRN: [id number]  HPI: [name] is a 60 year old female who returns for follow up for chronic pain and medication refill. She states her pain is located in her lower back radiating into her right hip. She rates her pain 9. Her current exercise regime is walking and performing stretching exercises.  Pain Inventory Average Pain 9 Pain Right Now 9 My pain is sharp, burning, dull, stabbing, tingling and aching  In the last 24 hours, has pain interfered with the following? General activity 10 Relation with others 10 Enjoyment of life 10 What TIME of day is your pain at its worst? NA Sleep (in general) Fair  Pain is worse with: walking, sitting, inactivity, standing, unsure and some activites Pain improves with: rest, heat/ice, pacing activities, medication and injections Relief from Meds: 7  Mobility use a cane how many minutes can you walk? depends on pain level ability to climb steps?  yes do you drive?  yes Do you have any goals in this area?  yes  Function not employed: date last employed 02/2009 disabled: date disabled 02-2014 I need assistance with the following:  dressing, meal prep, household duties and shopping Do you have any goals in this area?  yes  Neuro/Psych weakness numbness tremor tingling trouble walking spasms dizziness confusion depression anxiety loss of taste or smell  Prior Studies Any changes since last visit?  no  Physicians involved in your care Any changes since last visit?  yes Opthalmologist- Dr. Marshall Cork   Family History  Problem Relation Age of Onset  . Cervical cancer Mother   . Diabetes Mother   . Stroke Mother   . Heart attack Father   . Stroke Father   . Colon cancer Father   . Anxiety disorder Maternal Aunt   . Depression Maternal Aunt   . Anxiety disorder Maternal Uncle   . Depression Maternal Uncle     suicide attempt  by gun shot wound to head. Survived  . Alcohol abuse Other   . Drug abuse Other   . Breast cancer Maternal Grandmother   . Diabetes Maternal Grandmother   . Breast cancer Paternal Aunt   . Colon polyps Sister     x 2   Social History   Social History  . Marital Status: Married    Spouse Name: N/A  . Number of Children: 0  . Years of Education: N/A   Occupational History  . disabled    Social History Main Topics  . Smoking status: Never Smoker   . Smokeless tobacco: Never Used  . Alcohol Use: No  . Drug Use: No  . Sexual Activity: Yes   Other Topics Concern  . None   Social History Narrative   Pt has h.s. degree   Past Surgical History  Procedure Laterality Date  . Abdominal hysterectomy  1999  . Tonsillectomy  1981  . Tubal ligation  1997  . Bunions removed  1988  . Edg  12/17/2004  . Electrocardiogram  05/27/2007  . Skin cancer excision    . Cholecystectomy  04/25/2008  . C4 c5 cage insertion  06/28/2013  . L5 s1 rod insertion  11/07/2012  . Skin cancer excision      several   Past Medical History  Diagnosis Date  . ANXIETY 06/10/2007  . ASYMPTOMATIC POSTMENOPAUSAL STATUS 07/19/2009  . Cough 04/02/2009  . DYSLIPIDEMIA 06/28/2008  .  GERD 06/10/2007  . 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 HYPERGLYCEMIA 06/28/2008  . HYPERTENSION 06/10/2007  . Irritable bowel syndrome 06/28/2008  . OSTEOARTHRITIS 06/10/2007  . UPPER RESPIRATORY INFECTION 03/26/2009  . WEIGHT GAIN 01/21/2010  . Degenerative arthritis of spine     back and neck  . Tubular adenoma of colon   . Hiatal hernia   . Esophageal stricture   . Anemia   . Skin cancer     basal cell  . Chronic headaches   . Depression   . Fibromyalgia   . Gallstones   . Pneumonia    BP 135/67 mmHg  Pulse 90  Resp 16  SpO2 97%  Opioid Risk Score:   Fall Risk Score:  `1  Depression screen PHQ 2/9  Depression screen [facility] 2/9 03/10/2016 06/10/2015 01/02/2015  Decreased Interest 0 1 3  Down, Depressed, Hopeless 1 1 1   PHQ - 2 Score 1 2 4     Altered sleeping - - 3  Tired, decreased energy - - 2  Change in appetite - - 2  Feeling bad or failure about yourself  - - 1  Trouble concentrating - - 3  Moving slowly or fidgety/restless - - 2  Suicidal thoughts - - 1  PHQ-9 Score - - 18       Review of Systems  Constitutional: Positive for appetite change.       Night sweats  Respiratory: Positive for shortness of breath.   Cardiovascular:       Limb swelling  Gastrointestinal: Positive for nausea, vomiting, abdominal pain and diarrhea.  Skin: Positive for rash.  Hematological: Bruises/bleeds easily.  All other systems reviewed and are negative.      Objective:   Physical Exam  Constitutional: She is oriented to person, place, and time. She appears well-developed and well-nourished.  HENT:  Head: Normocephalic and atraumatic.  Neck: Normal range of motion. Neck supple.  Cardiovascular: Normal rate and regular rhythm.   Pulmonary/Chest: Effort normal and breath sounds normal.  Musculoskeletal:  Normal Muscle Bulk and Muscle Testing Reveals: Upper Extremities: Full ROM and Muscle strength 5/5 Thoracic Paraspinal Tenderness: T-3- T-4 Lumbar Paraspinal Tenderness: L-3- L-4 Mainly Right Side Lower Extremities: Full ROM and Muscle Strength 5/5 Arises from chair with ease using straight cane for support Narrow Based Gait  Neurological: She is alert and oriented to person, place, and time.  Skin: Skin is warm and dry.  Psychiatric: She has a normal mood and affect.  Nursing note and vitals reviewed.         Assessment & Plan:  1. History of chronic lumbar facet disease with spondylosis/DDD/ L4-5 spondylolisthesis.With L4-5 L5-S1 decompression stabilization: Refilled: Hydrocodone 10/325mg  one tablet every 6 hours as needed #120.  We will continue the opioid monitoring program, this consists of regular clinic visits, examinations, urine drug screen, pill counts as well as use of [place] Controlled Substance  Reporting System. 2. Depression with anxiety: Continue Xanax, and Effexor. F/U with Dr. Valentina Shaggy. 3. Cervical spondylosis: Stable at this time with no complaints. Continue Medication Regime.  4. Post Concussion Syndrome: Continue Ritalin 10 mg one tablet twice a day 0700 and noon #60. 5. Insomnia: Continue Elavil  20 minutes of face to face patient care time was spent during this visit. All questions were encouraged and answered.   F/U in 1 month

## 2016-03-31 ENCOUNTER — Encounter: Payer: 59 | Attending: Physical Medicine and Rehabilitation | Admitting: Physical Medicine & Rehabilitation

## 2016-03-31 ENCOUNTER — Encounter: Payer: Self-pay | Admitting: Physical Medicine & Rehabilitation

## 2016-03-31 VITALS — BP 123/77 | HR 83 | Resp 14

## 2016-03-31 DIAGNOSIS — F411 Generalized anxiety disorder: Secondary | ICD-10-CM | POA: Diagnosis present

## 2016-03-31 DIAGNOSIS — M47812 Spondylosis without myelopathy or radiculopathy, cervical region: Secondary | ICD-10-CM | POA: Diagnosis not present

## 2016-03-31 DIAGNOSIS — M1711 Unilateral primary osteoarthritis, right knee: Secondary | ICD-10-CM | POA: Diagnosis not present

## 2016-03-31 DIAGNOSIS — M47816 Spondylosis without myelopathy or radiculopathy, lumbar region: Secondary | ICD-10-CM | POA: Diagnosis not present

## 2016-03-31 DIAGNOSIS — F0781 Postconcussional syndrome: Secondary | ICD-10-CM

## 2016-03-31 DIAGNOSIS — M461 Sacroiliitis, not elsewhere classified: Secondary | ICD-10-CM | POA: Insufficient documentation

## 2016-03-31 DIAGNOSIS — M47817 Spondylosis without myelopathy or radiculopathy, lumbosacral region: Secondary | ICD-10-CM | POA: Diagnosis not present

## 2016-03-31 MED ORDER — HYDROCODONE-ACETAMINOPHEN 10-325 MG PO TABS: 1.0000 | ORAL_TABLET | Freq: Four times a day (QID) | ORAL | Status: DC | PRN

## 2016-03-31 MED ORDER — METHYLPHENIDATE HCL 10 MG PO TABS: 10.0000 mg | ORAL_TABLET | Freq: Two times a day (BID) | ORAL | Status: DC

## 2016-03-31 NOTE — Patient Instructions (Addendum)
PLEASE CALL ME WITH ANY PROBLEMS OR QUESTIONS [phone]   TART CHERRY EXTRACT, TURMERIC, ?CELERY SEED, GINGER, OMEGA THREE FATTY ACIDS.

## 2016-03-31 NOTE — Progress Notes (Signed)
Subjective:    Patient ID: [name], female    DOB: May 03, 1956, 60 y.o.   MRN: [id number]  HPI   [name] is here regarding her chronic pain and concussion. She has had a few more migraines due to added stresses at home. She is having more right knee pain. She wears a copper knee brace which helps somewhat. The knee will swell after she exercises quite frequently. Her left knee has been fairly stable but has its moments due to depending upon it more.    Her attention and memory are still issue. The ritalin helps.   She maintains on hydrocodone for pain control which is effective.     Pain Inventory Average Pain 8 Pain Right Now 8 My pain is sharp, burning, dull, stabbing, tingling and aching  In the last 24 hours, has pain interfered with the following? General activity 10 Relation with others 10 Enjoyment of life 10 What TIME of day is your pain at its worst? all Sleep (in general) Fair  Pain is worse with: walking, sitting, inactivity, standing, unsure and some activites Pain improves with: rest, heat/ice, pacing activities and medication Relief from Meds: 6  Mobility walk with assistance use a cane how many minutes can you walk? 30-45 ability to climb steps?  yes do you drive?  yes Do you have any goals in this area?  yes  Function disabled: date disabled . I need assistance with the following:  meal prep, household duties and shopping Do you have any goals in this area?  yes  Neuro/Psych weakness numbness tremor tingling trouble walking spasms dizziness confusion depression anxiety loss of taste or smell  Prior Studies Any changes since last visit?  no  Physicians involved in your care Any changes since last visit?  no   Family History  Problem Relation Age of Onset  . Cervical cancer Mother   . Diabetes Mother   . Stroke Mother   . Heart attack Father   . Stroke Father   . Colon cancer Father   . Anxiety disorder Maternal Aunt   .  Depression Maternal Aunt   . Anxiety disorder Maternal Uncle   . Depression Maternal Uncle     suicide attempt by gun shot wound to head. Survived  . Alcohol abuse Other   . Drug abuse Other   . Breast cancer Maternal Grandmother   . Diabetes Maternal Grandmother   . Breast cancer Paternal Aunt   . Colon polyps Sister     x 2   Social History   Social History  . Marital Status: Married    Spouse Name: N/A  . Number of Children: 0  . Years of Education: N/A   Occupational History  . disabled    Social History Main Topics  . Smoking status: Never Smoker   . Smokeless tobacco: Never Used  . Alcohol Use: No  . Drug Use: No  . Sexual Activity: Yes   Other Topics Concern  . None   Social History Narrative   Pt has h.s. degree   Past Surgical History  Procedure Laterality Date  . Abdominal hysterectomy  1999  . Tonsillectomy  1981  . Tubal ligation  1997  . Bunions removed  1988  . Edg  12/17/2004  . Electrocardiogram  05/27/2007  . Skin cancer excision    . Cholecystectomy  04/25/2008  . C4 c5 cage insertion  06/28/2013  . L5 s1 rod insertion  11/07/2012  . Skin cancer excision  several   Past Medical History  Diagnosis Date  . ANXIETY 06/10/2007  . ASYMPTOMATIC POSTMENOPAUSAL STATUS 07/19/2009  . Cough 04/02/2009  . DYSLIPIDEMIA 06/28/2008  . GERD 06/10/2007  . 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 HYPERGLYCEMIA 06/28/2008  . HYPERTENSION 06/10/2007  . Irritable bowel syndrome 06/28/2008  . OSTEOARTHRITIS 06/10/2007  . UPPER RESPIRATORY INFECTION 03/26/2009  . WEIGHT GAIN 01/21/2010  . Degenerative arthritis of spine     back and neck  . Tubular adenoma of colon   . Hiatal hernia   . Esophageal stricture   . Anemia   . Skin cancer     basal cell  . Chronic headaches   . Depression   . Fibromyalgia   . Gallstones   . Pneumonia    BP 123/77 mmHg  Pulse 83  Resp 14  SpO2 98%  Opioid Risk Score:   Fall Risk Score:  `1  Depression screen PHQ 2/9  Depression screen [facility] 2/9  03/10/2016 06/10/2015 01/02/2015  Decreased Interest 0 1 3  Down, Depressed, Hopeless 1 1 1   PHQ - 2 Score 1 2 4   Altered sleeping - - 3  Tired, decreased energy - - 2  Change in appetite - - 2  Feeling bad or failure about yourself  - - 1  Trouble concentrating - - 3  Moving slowly or fidgety/restless - - 2  Suicidal thoughts - - 1  PHQ-9 Score - - 18     Review of Systems  Constitutional: Negative.   HENT: Negative for congestion.   Eyes: Negative for pain.  Respiratory: Negative for chest tightness.   Cardiovascular: Positive for leg swelling.  Endocrine: Negative.   Genitourinary: Negative for difficulty urinating.  Musculoskeletal: Positive for back pain.  Skin: Negative for color change.  Neurological: Positive for dizziness.  Hematological: Negative for adenopathy.  All other systems reviewed and are negative.      Objective:   Physical Exam  Constitutional: She is oriented to person, place, and time. She appears well-developed and well-nourished.  HENT:  Head: Normocephalic and atraumatic.  Eyes: Conjunctivae and EOM are normal. Pupils are equal, round, and reactive to light.  Neck: Normal range of motion.  Cardiovascular: Normal rate.  Pulmonary/Chest: Effort normal and breath sounds normal.  Abdominal: Soft. Bowel sounds are normal.  Musculoskeletal: Normal range of motion.  Back tender to palpation and extension today more than flexion. She has pain with palpation of the right greater troch. SIJ, PSIS's are painful. lower lumbar paraspinals are tight. Walks with cane. She is antalgic on the right knee. Med/lat joint line pain with palpation. McMurray's equivocal. Mild joint effusion Neurological: memory and attention are better. She takes her time. Focus is improved. . Strength symmetrical. Sensory is normal. DTR's 1+.  Skin: Skin is warm.  Psychiatric: affect MUCH improved.   Assessment & Plan:   ASSESSMENT:  1. History of chronic lumbar facet disease with  spondylosis/DDD/ L4-5 spondylolisthesis. s/p L4-5 L5-S1 decompression stabilization with substantial mprovement of her back and right leg pain.  2. Left knee meniscus injury.  3. Depression with anxiety. Much mproved with effexor and spiritual/social supports in place  4. Cervical spondylosis/Myofascial pain.- see below  5. Concussion on 05/13/14 with post concussion syndrome- she has ongoing memory and concentration deficits.  6. 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 OA right knee/meniscus    PLAN:  1. Reviewed numerous joint supplements which the patient will look into  2. After informed consent and  preparation of the skin with betadine and isopropyl alcohol, I injected 6mg  (1cc) of celestone and 4cc of 1% lidocaine into the right knee via lateral/anterior approach. Additionally, aspiration was performed prior to injection. The patient tolerated well, and no complications were encountered. Afterward the area was cleaned and dressed. Post- injection instructions were provided.  3. Hydrocodone will continue for breakthrough pain 10/325 q6 prn #120 with second rx for next month. 4. May continue melatonin to assist with sleep architecture  4. Elavil prn at bedtime  5. Maintain ritalin to 10mg  tid for attention/arousal--refilled today and for next month  - Memory deficits are out of result of her concussion/ head trauma. She does have SVD on MRI which was non-specific. Neuropsych testing/cognitive testing is not indicated as we've done this in the past and she is compensating with ritalin and adaptive strategies.  6 .Follow up in about 2 months with me . 30 minutes of face to face patient care time were spent during this visit. All questions were encouraged and answered.

## 2016-04-16 ENCOUNTER — Other Ambulatory Visit: Payer: Self-pay | Admitting: Internal Medicine

## 2016-05-25 ENCOUNTER — Other Ambulatory Visit: Payer: Self-pay | Admitting: Endocrinology

## 2016-06-01 ENCOUNTER — Telehealth: Payer: Self-pay | Admitting: Physical Medicine & Rehabilitation

## 2016-06-01 ENCOUNTER — Encounter: Payer: 59 | Attending: Physical Medicine and Rehabilitation | Admitting: Physical Medicine & Rehabilitation

## 2016-06-01 ENCOUNTER — Encounter: Payer: Self-pay | Admitting: Physical Medicine & Rehabilitation

## 2016-06-01 ENCOUNTER — Ambulatory Visit
Admission: RE | Admit: 2016-06-01 | Discharge: 2016-06-01 | Disposition: A | Discharge status: Home / Self Care | Payer: 59 | Source: Ambulatory Visit | Attending: Physical Medicine & Rehabilitation | Admitting: Physical Medicine & Rehabilitation

## 2016-06-01 VITALS — BP 130/79 | HR 87

## 2016-06-01 DIAGNOSIS — M79645 Pain in left finger(s): Secondary | ICD-10-CM

## 2016-06-01 DIAGNOSIS — M47816 Spondylosis without myelopathy or radiculopathy, lumbar region: Secondary | ICD-10-CM

## 2016-06-01 DIAGNOSIS — M47817 Spondylosis without myelopathy or radiculopathy, lumbosacral region: Secondary | ICD-10-CM | POA: Diagnosis not present

## 2016-06-01 DIAGNOSIS — M1711 Unilateral primary osteoarthritis, right knee: Secondary | ICD-10-CM | POA: Diagnosis not present

## 2016-06-01 DIAGNOSIS — F411 Generalized anxiety disorder: Secondary | ICD-10-CM

## 2016-06-01 DIAGNOSIS — M47812 Spondylosis without myelopathy or radiculopathy, cervical region: Secondary | ICD-10-CM | POA: Diagnosis not present

## 2016-06-01 DIAGNOSIS — F4310 Post-traumatic stress disorder, unspecified: Secondary | ICD-10-CM

## 2016-06-01 DIAGNOSIS — M461 Sacroiliitis, not elsewhere classified: Secondary | ICD-10-CM | POA: Insufficient documentation

## 2016-06-01 DIAGNOSIS — F0781 Postconcussional syndrome: Secondary | ICD-10-CM

## 2016-06-01 DIAGNOSIS — Z5181 Encounter for therapeutic drug level monitoring: Secondary | ICD-10-CM

## 2016-06-01 DIAGNOSIS — Z79899 Other long term (current) drug therapy: Secondary | ICD-10-CM

## 2016-06-01 MED ORDER — METHYLPHENIDATE HCL 10 MG PO TABS: 10.0000 mg | ORAL_TABLET | Freq: Two times a day (BID) | ORAL | 0 refills | Status: DC

## 2016-06-01 MED ORDER — HYDROCODONE-ACETAMINOPHEN 10-325 MG PO TABS: 1.0000 | ORAL_TABLET | Freq: Four times a day (QID) | ORAL | 0 refills | Status: DC | PRN

## 2016-06-01 NOTE — Telephone Encounter (Signed)
Patient was in today to see you and she didn't get her refills on Ritalin or Alprazolam.  She also wanted to let you know she had her Xray done of her hand and the results should be in.

## 2016-06-01 NOTE — Addendum Note (Signed)
Addended by: Roland Rack on: 06/01/2016 03:00 PM   Modules accepted: Orders

## 2016-06-01 NOTE — Patient Instructions (Signed)
PLEASE CALL ME WITH ANY PROBLEMS OR QUESTIONS ([phone])  

## 2016-06-01 NOTE — Progress Notes (Signed)
Subjective:    Patient ID: [name], female    DOB: Feb 07, 1956, 60 y.o.   MRN: [id number]  HPI   [name] is here in follow up. She still deals with ongoing issues related to her back/knees. She feels that they are "manageable" at this point. The right knee was responsive to injection. She is using some OTC supplements too.   The left index finger was traumatized while she was carrying in groceries. She was coming in the door and two heavy bags slid down her arm and she felt a pop when the bags landed on her left index finger.  She has put on a splint for the last 2 months but the finger is still quite tender and swells. She doesn't report any bruising or warmth.     Pain Inventory Average Pain 8 Pain Right Now 8 My pain is sharp, burning, dull, stabbing, tingling and aching  In the last 24 hours, has pain interfered with the following? General activity 10 Relation with others 10 Enjoyment of life 10 What TIME of day is your pain at its worst? . Sleep (in general) Poor  Pain is worse with: walking, sitting, inactivity, standing, unsure and some activites Pain improves with: rest, heat/ice, therapy/exercise, medication and injections Relief from Meds: 8  Mobility use a cane how many minutes can you walk? 30-60 ability to climb steps?  yes do you drive?  yes  Function disabled: date disabled 5/15 I need assistance with the following:  dressing, meal prep, household duties and shopping  Neuro/Psych bladder control problems weakness numbness tremor tingling spasms dizziness confusion depression anxiety loss of taste or smell  Prior Studies Any changes since last visit?  no  Physicians involved in your care Any changes since last visit?  no   Family History  Problem Relation Age of Onset  . Cervical cancer Mother   . Diabetes Mother   . Stroke Mother   . Heart attack Father   . Stroke Father   . Colon cancer Father   . Anxiety disorder Maternal Aunt     . Depression Maternal Aunt   . Anxiety disorder Maternal Uncle   . Depression Maternal Uncle     suicide attempt by gun shot wound to head. Survived  . Alcohol abuse Other   . Drug abuse Other   . Breast cancer Maternal Grandmother   . Diabetes Maternal Grandmother   . Breast cancer Paternal Aunt   . Colon polyps Sister     x 2   Social History   Social History  . Marital status: Married    Spouse name: N/A  . Number of children: 0  . Years of education: N/A   Occupational History  . disabled    Social History Main Topics  . Smoking status: Never Smoker  . Smokeless tobacco: Never Used  . Alcohol use No  . Drug use: No  . Sexual activity: Yes   Other Topics Concern  . None   Social History Narrative   Pt has h.s. degree   Past Surgical History:  Procedure Laterality Date  . ABDOMINAL HYSTERECTOMY  1999  . bunions removed  1988  . C4 C5 cage insertion  06/28/2013  . CHOLECYSTECTOMY  04/25/2008  . EDG  12/17/2004  . ELECTROCARDIOGRAM  05/27/2007  . L5 S1 rod insertion  11/07/2012  . SKIN CANCER EXCISION    . SKIN CANCER EXCISION     several  . TONSILLECTOMY  1981  . TUBAL  LIGATION  1997   Past Medical History:  Diagnosis Date  . Anemia   . ANXIETY 06/10/2007  . ASYMPTOMATIC POSTMENOPAUSAL STATUS 07/19/2009  . Chronic headaches   . Cough 04/02/2009  . Degenerative arthritis of spine    back and neck  . Depression   . DYSLIPIDEMIA 06/28/2008  . Esophageal stricture   . Fibromyalgia   . Gallstones   . GERD 06/10/2007  . Hiatal hernia   . 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 HYPERGLYCEMIA 06/28/2008  . HYPERTENSION 06/10/2007  . Irritable bowel syndrome 06/28/2008  . OSTEOARTHRITIS 06/10/2007  . Pneumonia   . Skin cancer    basal cell  . Tubular adenoma of colon   . UPPER RESPIRATORY INFECTION 03/26/2009  . WEIGHT GAIN 01/21/2010   BP 130/79   Pulse 87   SpO2 94%   Opioid Risk Score:   Fall Risk Score:  `1  Depression screen PHQ 2/9  Depression screen [facility] 2/9 03/10/2016  06/10/2015 01/02/2015  Decreased Interest 0 1 3  Down, Depressed, Hopeless 1 1 1   PHQ - 2 Score 1 2 4   Altered sleeping - - 3  Tired, decreased energy - - 2  Change in appetite - - 2  Feeling bad or failure about yourself  - - 1  Trouble concentrating - - 3  Moving slowly or fidgety/restless - - 2  Suicidal thoughts - - 1  PHQ-9 Score - - 18  Some recent data might be hidden    Pain Inventory Average Pain 8 Pain Right Now 8 My pain is sharp, burning, dull, stabbing, tingling and aching  In the last 24 hours, has pain interfered with the following? General activity 10 Relation with others 10 Enjoyment of life 10 What TIME of day is your pain at its worst? . Sleep (in general) Poor  Pain is worse with: walking, sitting, inactivity, standing, unsure and some activites Pain improves with: rest, heat/ice, pacing activities, medication and injections Relief from Meds: 8  Mobility use a cane how many minutes can you walk? 30 to 60 ability to climb steps?  yes do you drive?  yes  Function disabled: date disabled 5/10 I need assistance with the following:  dressing, meal prep, household duties and shopping  Neuro/Psych bladder control problems weakness numbness tremor tingling spasms dizziness confusion depression anxiety loss of taste or smell  Prior Studies Any changes since last visit?  no  Physicians involved in your care Any changes since last visit?  no   Family History  Problem Relation Age of Onset  . Cervical cancer Mother   . Diabetes Mother   . Stroke Mother   . Heart attack Father   . Stroke Father   . Colon cancer Father   . Anxiety disorder Maternal Aunt   . Depression Maternal Aunt   . Anxiety disorder Maternal Uncle   . Depression Maternal Uncle     suicide attempt by gun shot wound to head. Survived  . Alcohol abuse Other   . Drug abuse Other   . Breast cancer Maternal Grandmother   . Diabetes Maternal Grandmother   . Breast cancer  Paternal Aunt   . Colon polyps Sister     x 2   Social History   Social History  . Marital status: Married    Spouse name: N/A  . Number of children: 0  . Years of education: N/A   Occupational History  . disabled    Social History Main Topics  . Smoking status:  Never Smoker  . Smokeless tobacco: Never Used  . Alcohol use No  . Drug use: No  . Sexual activity: Yes   Other Topics Concern  . None   Social History Narrative   Pt has h.s. degree   Past Surgical History:  Procedure Laterality Date  . ABDOMINAL HYSTERECTOMY  1999  . bunions removed  1988  . C4 C5 cage insertion  06/28/2013  . CHOLECYSTECTOMY  04/25/2008  . EDG  12/17/2004  . ELECTROCARDIOGRAM  05/27/2007  . L5 S1 rod insertion  11/07/2012  . SKIN CANCER EXCISION    . SKIN CANCER EXCISION     several  . TONSILLECTOMY  1981  . TUBAL LIGATION  1997   Past Medical History:  Diagnosis Date  . Anemia   . ANXIETY 06/10/2007  . ASYMPTOMATIC POSTMENOPAUSAL STATUS 07/19/2009  . Chronic headaches   . Cough 04/02/2009  . Degenerative arthritis of spine    back and neck  . Depression   . DYSLIPIDEMIA 06/28/2008  . Esophageal stricture   . Fibromyalgia   . Gallstones   . GERD 06/10/2007  . Hiatal hernia   . 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 HYPERGLYCEMIA 06/28/2008  . HYPERTENSION 06/10/2007  . Irritable bowel syndrome 06/28/2008  . OSTEOARTHRITIS 06/10/2007  . Pneumonia   . Skin cancer    basal cell  . Tubular adenoma of colon   . UPPER RESPIRATORY INFECTION 03/26/2009  . WEIGHT GAIN 01/21/2010   BP 130/79   Pulse 87   SpO2 94%   Opioid Risk Score:   Fall Risk Score:  `1  Depression screen PHQ 2/9  Depression screen [facility] 2/9 03/10/2016 06/10/2015 01/02/2015  Decreased Interest 0 1 3  Down, Depressed, Hopeless 1 1 1   PHQ - 2 Score 1 2 4   Altered sleeping - - 3  Tired, decreased energy - - 2  Change in appetite - - 2  Feeling bad or failure about yourself  - - 1  Trouble concentrating - - 3  Moving slowly or  fidgety/restless - - 2  Suicidal thoughts - - 1  PHQ-9 Score - - 18  Some recent data might be hidden    Review of Systems  HENT: Negative.   Eyes: Negative.   Respiratory: Negative.   Cardiovascular: Negative.   Gastrointestinal: Negative.   Endocrine: Negative.   Genitourinary: Negative.   Musculoskeletal: Positive for back pain.  Skin: Negative.   Allergic/Immunologic: Negative.   Neurological: Positive for dizziness, weakness and numbness.  Hematological: Negative.   Psychiatric/Behavioral: Positive for confusion and dysphoric mood. The patient is nervous/anxious.        Objective:   Physical Exam  Review of Systems  Constitutional: Negative.   HENT: Negative for congestion.   Eyes: Negative for pain.  Respiratory: Negative for chest tightness.   Cardiovascular: Positive for leg swelling.  Endocrine: Negative.   Genitourinary: Negative for difficulty urinating.  Musculoskeletal: Positive for back pain.  Skin: Negative for color change.  Neurological: Positive for dizziness.  Hematological: Negative for adenopathy.  All other systems reviewed and are negative.      Objective:   Physical Exam  Constitutional: She is oriented to person, place, and time. She appears well-developed and well-nourished.  HENT:  Head: Normocephalic and atraumatic.  Eyes: Conjunctivae and EOM are normal. Pupils are equal, round, and reactive to light.  Neck: Normal range of motion.  Cardiovascular: Normal rate.  Pulmonary/Chest: Effort normal and breath sounds normal.  Abdominal: Soft. Bowel sounds are normal.  Musculoskeletal: left index finger tender at MCP---there is no obvious crepitus. No associated swelling. She has joint laxity throughout all fingers which his her norm. There is minimal associated swelling. Normal range of motion.   SIJ, PSIS's are painful. lower lumbar paraspinals are tight. Walks with cane.  Neurological: memory and attention are better. She takes her  time. Focus is improved. . Strength symmetrical. Sensory is normal. DTR's 1+.  Skin: Skin is warm.  Psychiatric: affect MUCH improved.   Assessment & Plan:   ASSESSMENT:  1. 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 s/p L4-5 L5-S1 decompression stabilization with substantial mprovement of her back and right leg pain.  2. Left knee meniscus injury.  3. Depression with anxiety. Much mproved with effexor and spiritual/social supports in place  4. Cervical spondylosis/Myofascial pain.- see below  5. Concussion on 05/13/14 with post concussion syndrome- she has ongoing memory and concentration deficits.  6. 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 OA right knee/meniscus---good results with June injection 8. Left 2nd MCP injury from late June--articular fracture due to dislocation?     PLAN:  1. Reviewed numerous joint supplements which the patient will look into  2. Xrays of left index finger. Continue splint. Will determine follow up based on xr fingdings 3. Hydrocodone will continue for breakthrough pain 10/325 q6 prn #120 with second rx for next month. 4. May continue melatonin to assist with sleep architecture  4. Elavil prn at bedtime  5. Maintain ritalin to 10mg  tid for attention/arousal--refilled today and for next month                       -continue adaptive strategies 6 .Follow up in about 2 months with me . 30 minutes of face to face patient care time were spent during this visit. All questions were encouraged and answered.

## 2016-06-01 NOTE — Telephone Encounter (Signed)
I left her a VM. rx'es signed and printed

## 2016-06-02 MED ORDER — ALPRAZOLAM 0.5 MG PO TABS: 0.5000 mg | ORAL_TABLET | Freq: Three times a day (TID) | ORAL | 2 refills | Status: DC | PRN

## 2016-06-02 NOTE — Addendum Note (Signed)
Addended by: Caro Hight on: 06/02/2016 01:37 PM   Modules accepted: Orders

## 2016-06-10 LAB — TOXASSURE SELECT,+ANTIDEPR,UR: PDF: 0

## 2016-06-16 NOTE — Progress Notes (Signed)
Urine drug screen for this encounter is consistent for prescribed medications.   

## 2016-06-24 ENCOUNTER — Encounter: Payer: Self-pay | Admitting: Physical Medicine & Rehabilitation

## 2016-06-24 ENCOUNTER — Encounter: Payer: 59 | Attending: Physical Medicine and Rehabilitation | Admitting: Physical Medicine & Rehabilitation

## 2016-06-24 DIAGNOSIS — F411 Generalized anxiety disorder: Secondary | ICD-10-CM | POA: Diagnosis present

## 2016-06-24 DIAGNOSIS — M47817 Spondylosis without myelopathy or radiculopathy, lumbosacral region: Secondary | ICD-10-CM | POA: Insufficient documentation

## 2016-06-24 DIAGNOSIS — M779 Enthesopathy, unspecified: Secondary | ICD-10-CM | POA: Diagnosis not present

## 2016-06-24 DIAGNOSIS — M461 Sacroiliitis, not elsewhere classified: Secondary | ICD-10-CM | POA: Insufficient documentation

## 2016-06-24 DIAGNOSIS — M778 Other enthesopathies, not elsewhere classified: Secondary | ICD-10-CM

## 2016-06-24 NOTE — Patient Instructions (Signed)
KEEP THE WRIST/FINGER NUMB AND WRAPPED OVERNIGHT. YOU CAN TAKE OUT OF DRESSING TOMORROW

## 2016-06-24 NOTE — Progress Notes (Signed)
PROCEDURE NOTE: Indication: ongoing left second digit pain, extensor tendinitis  After informed consent and preparation of the skin with betadine and isopropyl alcohol, I injected 6mg  (1cc) of celestone and 2cc of 1% lidocaine AROUND the left second medial proxmial phalanx  via medial approach. Additionally, aspiration was performed prior to injection. The patient tolerated well, and no complications were encountered. Afterward the area was cleaned and dressed. Post- injection instructions were provided.   If no improvement after injection will consider MRI hand to assess for tendinous injury

## 2016-07-15 ENCOUNTER — Other Ambulatory Visit: Payer: Self-pay | Admitting: Internal Medicine

## 2016-07-27 ENCOUNTER — Telehealth: Payer: Self-pay

## 2016-07-27 NOTE — Telephone Encounter (Signed)
Pt aware.

## 2016-07-27 NOTE — Telephone Encounter (Signed)
-----   Message from Algernon Huxley, RN sent at 02/03/2016 11:36 AM EDT ----- Regarding: Labs Pt needs labs, orders in epic

## 2016-07-29 ENCOUNTER — Encounter: Payer: 59 | Attending: Physical Medicine and Rehabilitation | Admitting: Physical Medicine & Rehabilitation

## 2016-07-29 ENCOUNTER — Encounter: Payer: Self-pay | Admitting: Physical Medicine & Rehabilitation

## 2016-07-29 DIAGNOSIS — F411 Generalized anxiety disorder: Secondary | ICD-10-CM | POA: Insufficient documentation

## 2016-07-29 DIAGNOSIS — M47817 Spondylosis without myelopathy or radiculopathy, lumbosacral region: Secondary | ICD-10-CM | POA: Diagnosis not present

## 2016-07-29 DIAGNOSIS — F0781 Postconcussional syndrome: Secondary | ICD-10-CM

## 2016-07-29 DIAGNOSIS — M461 Sacroiliitis, not elsewhere classified: Secondary | ICD-10-CM | POA: Insufficient documentation

## 2016-07-29 MED ORDER — METHYLPHENIDATE HCL 10 MG PO TABS: 10.0000 mg | ORAL_TABLET | Freq: Two times a day (BID) | ORAL | 0 refills | Status: DC

## 2016-07-29 MED ORDER — HYDROCODONE-ACETAMINOPHEN 10-325 MG PO TABS: 1.0000 | ORAL_TABLET | Freq: Four times a day (QID) | ORAL | 0 refills | Status: DC | PRN

## 2016-07-29 NOTE — Progress Notes (Signed)
Subjective:    Patient ID: [name], female    DOB: Nov 23, 1955, 60 y.o.   MRN: [id number]  HPI   [name] is here in follow up of her chronic pain. She had good results with the left index finger injections. The finger gets sore when she's more active with her hands, but activity tolerance in general is much improved.   She continues on her hydrocodone for her chronic back and knee pain. She denies any recent falls. She has been busy painting her house.   Mood has been good. She reports no new medical changes.   Pain Inventory Average Pain 9 Pain Right Now 9 My pain is constant, sharp, burning, dull, stabbing, tingling and aching  In the last 24 hours, has pain interfered with the following? General activity 10 Relation with others 10 Enjoyment of life 10 What TIME of day is your pain at its worst? all Sleep (in general) Fair  Pain is worse with: walking, bending, sitting, inactivity, standing, unsure and some activites Pain improves with: rest, heat/ice, therapy/exercise, pacing activities, medication and injections Relief from Meds: 9  Mobility walk without assistance walk with assistance use a cane how many minutes can you walk? 30-60 ability to climb steps?  yes do you drive?  yes Do you have any goals in this area?  yes  Function disabled: date disabled 2015 I need assistance with the following:  dressing, meal prep, household duties and shopping Do you have any goals in this area?  yes  Neuro/Psych bladder control problems weakness numbness tremor tingling trouble walking spasms dizziness confusion depression anxiety loss of taste or smell  Prior Studies Any changes since last visit?  yes  Physicians involved in your care Any changes since last visit?  yes   Family History  Problem Relation Age of Onset  . Cervical cancer Mother   . Diabetes Mother   . Stroke Mother   . Heart attack Father   . Stroke Father   . Colon cancer Father   .  Anxiety disorder Maternal Aunt   . Depression Maternal Aunt   . Anxiety disorder Maternal Uncle   . Depression Maternal Uncle     suicide attempt by gun shot wound to head. Survived  . Alcohol abuse Other   . Drug abuse Other   . Breast cancer Maternal Grandmother   . Diabetes Maternal Grandmother   . Breast cancer Paternal Aunt   . Colon polyps Sister     x 2   Social History   Social History  . Marital status: Married    Spouse name: N/A  . Number of children: 0  . Years of education: N/A   Occupational History  . disabled    Social History Main Topics  . Smoking status: Never Smoker  . Smokeless tobacco: Never Used  . Alcohol use No  . Drug use: No  . Sexual activity: Yes   Other Topics Concern  . None   Social History Narrative   Pt has h.s. degree   Past Surgical History:  Procedure Laterality Date  . ABDOMINAL HYSTERECTOMY  1999  . bunions removed  1988  . C4 C5 cage insertion  06/28/2013  . CHOLECYSTECTOMY  04/25/2008  . EDG  12/17/2004  . ELECTROCARDIOGRAM  05/27/2007  . L5 S1 rod insertion  11/07/2012  . SKIN CANCER EXCISION    . SKIN CANCER EXCISION     several  . TONSILLECTOMY  1981  . [place]  Past Medical History:  Diagnosis Date  . Anemia   . ANXIETY 06/10/2007  . ASYMPTOMATIC POSTMENOPAUSAL STATUS 07/19/2009  . Chronic headaches   . Cough 04/02/2009  . Degenerative arthritis of spine    back and neck  . Depression   . DYSLIPIDEMIA 06/28/2008  . Esophageal stricture   . Fibromyalgia   . Gallstones   . GERD 06/10/2007  . Hiatal hernia   . 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 HYPERGLYCEMIA 06/28/2008  . HYPERTENSION 06/10/2007  . Irritable bowel syndrome 06/28/2008  . OSTEOARTHRITIS 06/10/2007  . Pneumonia   . Skin cancer    basal cell  . Tubular adenoma of colon   . UPPER RESPIRATORY INFECTION 03/26/2009  . WEIGHT GAIN 01/21/2010   BP 126/78   Pulse 92   Resp 14   SpO2 95%   Opioid Risk Score:   Fall Risk Score:  `1  Depression screen PHQ  2/9  Depression screen [facility] 2/9 03/10/2016 06/10/2015 01/02/2015  Decreased Interest 0 1 3  Down, Depressed, Hopeless 1 1 1   PHQ - 2 Score 1 2 4   Altered sleeping - - 3  Tired, decreased energy - - 2  Change in appetite - - 2  Feeling bad or failure about yourself  - - 1  Trouble concentrating - - 3  Moving slowly or fidgety/restless - - 2  Suicidal thoughts - - 1  PHQ-9 Score - - 18  Some recent data might be hidden    Review of Systems  Constitutional: Positive for unexpected weight change.  All other systems reviewed and are negative.      Objective:   Physical Exam  Constitutional: She is oriented to person, place, and time. She appears well-developed and well-nourished.  HENT:  Head: Normocephalic and atraumatic.  Eyes: Conjunctivae and EOM are normal. Pupils are equal, round, and reactive to light.  Neck: Normal range of motion.  Cardiovascular: Normal rate.  Pulmonary/Chest: Effort normal and breath sounds normal.  Abdominal: Soft. Bowel sounds are normal.  Musculoskeletal: left index finger tender at MCP---there is no obvious crepitus. No associated swelling. She has joint laxity throughout all fingers which his her norm. There is minimal associated swelling. Normal range of motion.   SIJ, PSIS's are painful. lower lumbar paraspinals are tight. Walks with cane.  Neurological: memory and attention are better. She takes her time. Focus is improved. . Strength symmetrical. Sensory is normal. DTR's 1+.  Skin: Skin is warm.  Psychiatric: affect MUCH improved.    Assessment & Plan:  ASSESSMENT:  1. 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 s/p L4-5 L5-S1 decompression stabilization with substantial mprovement of her back and right leg pain.  2. Left knee meniscus injury.  3. Depression with anxiety. Much mproved with effexor and spiritual/social supports in place  4. Cervical spondylosis/Myofascial pain.- see below  5.  Concussion on 05/13/14 with post concussion syndrome- has ongoing memory and concentration deficits.  6. 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 OA right knee/meniscus---good results with June injection 8. Left 2nd MCP injury from late June---mild tendonitis---responsive to injections    PLAN:  1.Discussed safe and sensible activity--Safety first! 2. Splint left index finger with activity. Gentle ROM and strengthening 3. Hydrocodone will continue for breakthrough pain 10/325 q6 prn #120 with second rx for next month. 4. May continue melatonin to assist with sleep architecture  4. Elavil prn at bedtime  5. Maintain ritalin to 10mg  tid for attention/arousal--refilled today  and for next month -continue adaptive strategies for memory and attention 6 .Follow up in about 2 months with me . 15 minutes of face to face patient care time were spent during this visit. All questions were encouraged and answered.

## 2016-07-29 NOTE — Patient Instructions (Signed)
WRAP YOUR INDEX FINGER IF YOU ARE PAINTING OR DOING OTHER WORK  GENTLE RANGE OF MOTION TO YOUR FINGER/HAND

## 2016-08-04 ENCOUNTER — Encounter: Payer: Self-pay | Admitting: Endocrinology

## 2016-08-04 ENCOUNTER — Ambulatory Visit (INDEPENDENT_AMBULATORY_CARE_PROVIDER_SITE_OTHER): Payer: 59 | Admitting: Endocrinology

## 2016-08-04 VITALS — BP 126/84 | HR 92 | Ht 65.0 in | Wt 153.0 lb

## 2016-08-04 DIAGNOSIS — R739 Hyperglycemia, unspecified: Secondary | ICD-10-CM | POA: Diagnosis not present

## 2016-08-04 DIAGNOSIS — Z23 Encounter for immunization: Secondary | ICD-10-CM | POA: Diagnosis not present

## 2016-08-04 DIAGNOSIS — M47817 Spondylosis without myelopathy or radiculopathy, lumbosacral region: Secondary | ICD-10-CM

## 2016-08-04 DIAGNOSIS — M810 Age-related osteoporosis without current pathological fracture: Secondary | ICD-10-CM

## 2016-08-04 DIAGNOSIS — I10 Essential (primary) hypertension: Secondary | ICD-10-CM

## 2016-08-04 DIAGNOSIS — Z0000 Encounter for general adult medical examination without abnormal findings: Secondary | ICD-10-CM | POA: Diagnosis not present

## 2016-08-04 LAB — CBC WITH DIFFERENTIAL/PLATELET
BASOS ABS: 0 10*3/uL (ref 0.0–0.1)
Basophils Relative: 0.6 % (ref 0.0–3.0)
EOS ABS: 0 10*3/uL (ref 0.0–0.7)
Eosinophils Relative: 0.6 % (ref 0.0–5.0)
HEMATOCRIT: 41.1 % (ref 36.0–46.0)
Hemoglobin: 14 g/dL (ref 12.0–15.0)
LYMPHS ABS: 1 10*3/uL (ref 0.7–4.0)
LYMPHS PCT: 20.3 % (ref 12.0–46.0)
MCHC: 34 g/dL (ref 30.0–36.0)
MCV: 84.3 fl (ref 78.0–100.0)
MONOS PCT: 4.8 % (ref 3.0–12.0)
Monocytes Absolute: 0.2 10*3/uL (ref 0.1–1.0)
NEUTROS PCT: 73.7 % (ref 43.0–77.0)
Neutro Abs: 3.8 10*3/uL (ref 1.4–7.7)
Platelets: 205 10*3/uL (ref 150.0–400.0)
RBC: 4.87 Mil/uL (ref 3.87–5.11)
RDW: 13.8 % (ref 11.5–15.5)
WBC: 5.2 10*3/uL (ref 4.0–10.5)

## 2016-08-04 LAB — HEPATIC FUNCTION PANEL
ALBUMIN: 4.8 g/dL (ref 3.5–5.2)
ALT: 12 U/L (ref 0–35)
AST: 13 U/L (ref 0–37)
Alkaline Phosphatase: 70 U/L (ref 39–117)
Bilirubin, Direct: 0.1 mg/dL (ref 0.0–0.3)
TOTAL PROTEIN: 7 g/dL (ref 6.0–8.3)
Total Bilirubin: 0.6 mg/dL (ref 0.2–1.2)

## 2016-08-04 LAB — BASIC METABOLIC PANEL
BUN: 12 mg/dL (ref 6–23)
CALCIUM: 9.7 mg/dL (ref 8.4–10.5)
CO2: 30 mEq/L (ref 19–32)
CREATININE: 0.71 mg/dL (ref 0.40–1.20)
Chloride: 103 mEq/L (ref 96–112)
GFR: 89.47 mL/min (ref >60.00)
Glucose, Bld: 100 mg/dL — ABNORMAL HIGH (ref 70–99)
Potassium: 3.4 mEq/L — ABNORMAL LOW (ref 3.5–5.1)
SODIUM: 141 meq/L (ref 135–145)

## 2016-08-04 LAB — URINALYSIS, ROUTINE W REFLEX MICROSCOPIC
Ketones, ur: NEGATIVE
LEUKOCYTES UA: NEGATIVE
NITRITE: NEGATIVE
PH: 6 (ref 5.0–8.0)
SPECIFIC GRAVITY, URINE: 1.025 (ref 1.000–1.030)
TOTAL PROTEIN, URINE-UPE24: NEGATIVE
URINE GLUCOSE: NEGATIVE
UROBILINOGEN UA: 0.2 (ref 0.0–1.0)
WBC, UA: NONE SEEN (ref 0–?)

## 2016-08-04 LAB — LIPID PANEL
CHOL/HDL RATIO: 3
Cholesterol: 166 mg/dL (ref 0–200)
HDL: 51.7 mg/dL (ref >39.00)
LDL CALC: 78 mg/dL (ref 0–99)
NONHDL: 114.34
Triglycerides: 182 mg/dL — ABNORMAL HIGH (ref 0.0–149.0)
VLDL: 36.4 mg/dL (ref 0.0–40.0)

## 2016-08-04 LAB — TSH: TSH: 2.42 u[IU]/mL (ref 0.35–4.50)

## 2016-08-04 LAB — VITAMIN D 25 HYDROXY (VIT D DEFICIENCY, FRACTURES): VITD: 23.37 ng/mL — ABNORMAL LOW (ref 30.00–100.00)

## 2016-08-04 LAB — HEMOGLOBIN A1C: HEMOGLOBIN A1C: 5.5 % (ref 4.6–6.5)

## 2016-08-04 MED ORDER — LOSARTAN POTASSIUM-HCTZ 50-12.5 MG PO TABS: 0.5000 | ORAL_TABLET | Freq: Every day | ORAL | 3 refills | Status: DC

## 2016-08-04 NOTE — Progress Notes (Signed)
Subjective:    Patient ID: [name], female    DOB: 16-Dec-1955, 60 y.o.   MRN: [id number]  HPI Pt is here for regular wellness examination, and is feeling pretty well in general, and says chronic med probs are stable, except as noted below Past Medical History:  Diagnosis Date  . Anemia   . ANXIETY 06/10/2007  . ASYMPTOMATIC POSTMENOPAUSAL STATUS 07/19/2009  . Chronic headaches   . Cough 04/02/2009  . Degenerative arthritis of spine    back and neck  . Depression   . DYSLIPIDEMIA 06/28/2008  . Esophageal stricture   . Fibromyalgia   . Gallstones   . GERD 06/10/2007  . Hiatal hernia   . 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 HYPERGLYCEMIA 06/28/2008  . HYPERTENSION 06/10/2007  . Irritable bowel syndrome 06/28/2008  . OSTEOARTHRITIS 06/10/2007  . Pneumonia   . Skin cancer    basal cell  . Tubular adenoma of colon   . UPPER RESPIRATORY INFECTION 03/26/2009  . WEIGHT GAIN 01/21/2010    Past Surgical History:  Procedure Laterality Date  . ABDOMINAL HYSTERECTOMY  1999  . bunions removed  1988  . C4 C5 cage insertion  06/28/2013  . CHOLECYSTECTOMY  04/25/2008  . EDG  12/17/2004  . ELECTROCARDIOGRAM  05/27/2007  . L5 S1 rod insertion  11/07/2012  . SKIN CANCER EXCISION    . SKIN CANCER EXCISION     several  . TONSILLECTOMY  1981  . TUBAL LIGATION  1997    Social History   Social History  . Marital status: Married    Spouse name: N/A  . Number of children: 0  . Years of education: N/A   Occupational History  . disabled    Social History Main Topics  . Smoking status: Never Smoker  . Smokeless tobacco: Never Used  . Alcohol use No  . Drug use: No  . Sexual activity: Yes   Other Topics Concern  . Not on file   Social History Narrative   Pt has h.s. degree    Current Outpatient Prescriptions on File Prior to Visit  Medication Sig Dispense Refill  . acyclovir (ZOVIRAX) 800 MG tablet Take 1 tablet (800 mg total) by mouth daily. 90 tablet 3  . ALPRAZolam (XANAX) 0.5 MG tablet Take 1  tablet (0.5 mg total) by mouth 3 (three) times daily as needed for anxiety. 90 tablet 2  . amitriptyline (ELAVIL) 10 MG tablet Take 1-2 tablets (10-20 mg total) by mouth at bedtime as needed. 60 tablet 2  . aspirin 325 MG tablet Take 325 mg by mouth daily.    Marland Kitchen atorvastatin (LIPITOR) 40 MG tablet TAKE 1 TABLET DAILY 90 tablet 0  . ferrous sulfate 325 (65 FE) MG tablet Take 325 mg by mouth daily.     Marland Kitchen HYDROcodone-acetaminophen (NORCO) 10-325 MG tablet Take 1 tablet by mouth every 6 (six) hours as needed. 120 tablet 0  . meclizine (ANTIVERT) 12.5 MG tablet TAKE 1 TABLET (12.5 MG TOTAL) BY MOUTH 3 (THREE) TIMES DAILY AS NEEDED. 45 tablet 2  . Melatonin 10 MG TABS Take 1 tablet by mouth as needed.    . methylphenidate (RITALIN) 10 MG tablet Take 1 tablet (10 mg total) by mouth 2 (two) times daily with breakfast and lunch. 0700,1200 60 tablet 0  . pantoprazole (PROTONIX) 40 MG tablet TAKE 1 TABLET TWICE A DAY (DISCONTINUE OMEPRAZOLE) 180 tablet 0  . ranitidine (ZANTAC) 150 MG capsule Take 1 capsule (150 mg total) by mouth every evening. [place]  capsule 6  . venlafaxine XR (EFFEXOR-XR) 150 MG 24 hr capsule Take 1 capsule (150 mg total) by mouth daily with breakfast. 90 capsule 3  . vitamin B-12 (CYANOCOBALAMIN) 1000 MCG tablet Take 1,000 mcg by mouth daily.     No current facility-administered medications on file prior to visit.     Allergies  Allergen Reactions  . Gabapentin Other (See Comments)    Bad headaches  . Poison Ivy Extract [Poison Ivy Extract] Rash  . Poison Oak Extract [Poison Oak Extract] Rash  . Poison Sumac Extract Rash    Family History  Problem Relation Age of Onset  . Cervical cancer Mother   . Diabetes Mother   . Stroke Mother   . Heart attack Father   . Stroke Father   . Colon cancer Father   . Anxiety disorder Maternal Aunt   . Depression Maternal Aunt   . Anxiety disorder Maternal Uncle   . Depression Maternal Uncle     suicide attempt by gun shot wound to head.  Survived  . Alcohol abuse Other   . Drug abuse Other   . Breast cancer Maternal Grandmother   . Diabetes Maternal Grandmother   . Breast cancer Paternal Aunt   . Colon polyps Sister     x 2    BP 126/84   Pulse 92   Ht 5\' 5"  (1.651 m)   Wt 153 lb (69.4 kg)   SpO2 97%   BMI 25.46 kg/m     Review of Systems  Constitutional: Negative for fever.       She has lost a few lbs  HENT: Negative for hearing loss.   Eyes: Negative for visual disturbance.  Respiratory: Negative for shortness of breath.   Cardiovascular: Negative for chest pain.  Gastrointestinal: Negative for anal bleeding.  Endocrine: Negative for cold intolerance.  Genitourinary: Negative for hematuria.  Musculoskeletal: Negative for gait problem.  Skin: Negative for rash.  Allergic/Immunologic: Negative for environmental allergies.  Neurological: Negative for numbness.  Hematological: Bruises/bleeds easily.  Psychiatric/Behavioral:       Depression is improved recently      Objective:   Physical Exam VS: see vs page GEN: no distress HEAD: head: no deformity eyes: no periorbital swelling, no proptosis external nose and ears are normal mouth: no lesion seen NECK: supple, thyroid is not enlarged CHEST WALL: no deformity LUNGS: clear to auscultation CV: reg rate and rhythm, no murmur ABD: abdomen is soft, nontender.  no hepatosplenomegaly.  not distended.  no hernia MUSCULOSKELETAL: muscle bulk and strength are grossly normal.  no obvious joint swelling.  gait is normal and steady EXTEMITIES: no deformity.  no ulcer on the feet.  feet are of normal color and temp.  no edema PULSES: dorsalis pedis intact bilat.  no carotid bruit NEURO:  cn 2-12 grossly intact.   readily moves all 4's.  sensation is intact to touch on the feet.  SKIN:  Normal texture and temperature.  No rash or suspicious lesion is visible.   NODES:  None palpable at the neck PSYCH: alert, well-oriented.  Does not appear anxious nor  depressed.     ecg machine is not working    Assessment & Plan:  Wellness visit today, with problems stable, except as noted. HTN: slightly overcontrolled.  Patient is advised the following: Patient Instructions  Please consider these measures for your health:  minimize alcohol.  Do not use tobacco products.  Have a colonoscopy at least every 10 years from age  49.  Women should have an annual mammogram from age 72.  Keep firearms safely stored.  Always use seat belts.  have working smoke alarms in your home.  See an eye doctor and dentist regularly.  Never drive under the influence of alcohol or drugs (including prescription drugs).  Those with fair skin should take precautions against the sun, and should carefully examine their skin once per month, for any new or changed moles. It is critically important to prevent falling down (keep floor areas well-lit, dry, and free of loose objects.  If you have a cane, walker, or wheelchair, you should use it, even for short trips around the house.  Wear flat-soled shoes.  Also, try not to rush).   Please decrease the blood pressure pill to 1/2 pill per day.

## 2016-08-04 NOTE — Progress Notes (Signed)
we discussed code status.  pt requests full code, but would not want to be started or maintained on artificial life-support measures if there was not a reasonable chance of recovery 

## 2016-08-04 NOTE — Patient Instructions (Addendum)
Please consider these measures for your health:  minimize alcohol.  Do not use tobacco products.  Have a colonoscopy at least every 10 years from age 59.  Women should have an annual mammogram from age 30.  Keep firearms safely stored.  Always use seat belts.  have working smoke alarms in your home.  See an eye doctor and dentist regularly.  Never drive under the influence of alcohol or drugs (including prescription drugs).  Those with fair skin should take precautions against the sun, and should carefully examine their skin once per month, for any new or changed moles. It is critically important to prevent falling down (keep floor areas well-lit, dry, and free of loose objects.  If you have a cane, walker, or wheelchair, you should use it, even for short trips around the house.  Wear flat-soled shoes.  Also, try not to rush).   Please decrease the blood pressure pill to 1/2 pill per day.

## 2016-08-05 LAB — HEPATITIS C ANTIBODY: HCV AB: NEGATIVE

## 2016-08-05 LAB — HIV ANTIBODY (ROUTINE TESTING W REFLEX): HIV 1&2 Ab, 4th Generation: NONREACTIVE

## 2016-08-06 LAB — PTH, INTACT AND CALCIUM
CALCIUM: 9.4 mg/dL (ref 8.6–10.4)
PTH: 63 pg/mL (ref 14–64)

## 2016-08-10 ENCOUNTER — Telehealth: Payer: Self-pay

## 2016-08-17 ENCOUNTER — Other Ambulatory Visit (INDEPENDENT_AMBULATORY_CARE_PROVIDER_SITE_OTHER): Payer: 59

## 2016-08-17 DIAGNOSIS — D509 Iron deficiency anemia, unspecified: Secondary | ICD-10-CM | POA: Diagnosis not present

## 2016-08-17 LAB — CBC WITH DIFFERENTIAL/PLATELET
BASOS PCT: 0.6 % (ref 0.0–3.0)
Basophils Absolute: 0 10*3/uL (ref 0.0–0.1)
EOS PCT: 1 % (ref 0.0–5.0)
Eosinophils Absolute: 0.1 10*3/uL (ref 0.0–0.7)
HCT: 39.5 % (ref 36.0–46.0)
Hemoglobin: 13.6 g/dL (ref 12.0–15.0)
LYMPHS ABS: 1.3 10*3/uL (ref 0.7–4.0)
Lymphocytes Relative: 25.5 % (ref 12.0–46.0)
MCHC: 34.4 g/dL (ref 30.0–36.0)
MCV: 83.4 fl (ref 78.0–100.0)
MONOS PCT: 5.9 % (ref 3.0–12.0)
Monocytes Absolute: 0.3 10*3/uL (ref 0.1–1.0)
NEUTROS ABS: 3.5 10*3/uL (ref 1.4–7.7)
NEUTROS PCT: 67 % (ref 43.0–77.0)
PLATELETS: 207 10*3/uL (ref 150.0–400.0)
RBC: 4.74 Mil/uL (ref 3.87–5.11)
RDW: 13.4 % (ref 11.5–15.5)
WBC: 5.2 10*3/uL (ref 4.0–10.5)

## 2016-08-17 LAB — IBC PANEL
Iron: 90 ug/dL (ref 42–145)
Saturation Ratios: 30.3 % (ref 20.0–50.0)
Transferrin: 212 mg/dL (ref 212.0–360.0)

## 2016-08-17 LAB — FERRITIN: Ferritin: 95.6 ng/mL (ref 10.0–291.0)

## 2016-08-18 ENCOUNTER — Other Ambulatory Visit: Payer: Self-pay

## 2016-08-18 DIAGNOSIS — D509 Iron deficiency anemia, unspecified: Secondary | ICD-10-CM

## 2016-08-19 ENCOUNTER — Other Ambulatory Visit: Payer: Self-pay | Admitting: Endocrinology

## 2016-08-19 DIAGNOSIS — Z1231 Encounter for screening mammogram for malignant neoplasm of breast: Secondary | ICD-10-CM

## 2016-08-20 ENCOUNTER — Telehealth: Payer: Self-pay | Admitting: Endocrinology

## 2016-08-20 DIAGNOSIS — R319 Hematuria, unspecified: Secondary | ICD-10-CM

## 2016-08-20 NOTE — Telephone Encounter (Signed)
See message and please advise, Thanks!  

## 2016-08-20 NOTE — Telephone Encounter (Signed)
Patient stated that which ever doctor you wanted her to go to is fine. oncologist urologist or obgyn. Please advise

## 2016-08-21 ENCOUNTER — Encounter: Payer: Self-pay | Admitting: Endocrinology

## 2016-08-21 DIAGNOSIS — R319 Hematuria, unspecified: Secondary | ICD-10-CM | POA: Insufficient documentation

## 2016-08-21 NOTE — Telephone Encounter (Signed)
Left detailed message.   

## 2016-08-21 NOTE — Telephone Encounter (Signed)
Please see a urology specialist.  you will receive a phone call, about a day and time for an appointment

## 2016-08-24 ENCOUNTER — Other Ambulatory Visit: Payer: Self-pay | Admitting: Endocrinology

## 2016-08-26 NOTE — Telephone Encounter (Signed)
Patient stated no one call her to schedule her urologist appt please advise

## 2016-09-01 ENCOUNTER — Telehealth: Payer: Self-pay | Admitting: *Deleted

## 2016-09-01 DIAGNOSIS — F0781 Postconcussional syndrome: Secondary | ICD-10-CM

## 2016-09-01 DIAGNOSIS — M47817 Spondylosis without myelopathy or radiculopathy, lumbosacral region: Secondary | ICD-10-CM

## 2016-09-01 DIAGNOSIS — F411 Generalized anxiety disorder: Secondary | ICD-10-CM

## 2016-09-01 MED ORDER — METHYLPHENIDATE HCL 10 MG PO TABS: 10.0000 mg | ORAL_TABLET | Freq: Two times a day (BID) | ORAL | 0 refills | Status: DC

## 2016-09-01 MED ORDER — HYDROCODONE-ACETAMINOPHEN 10-325 MG PO TABS: 1.0000 | ORAL_TABLET | Freq: Four times a day (QID) | ORAL | 0 refills | Status: DC | PRN

## 2016-09-01 MED ORDER — ALPRAZOLAM 0.5 MG PO TABS: 0.5000 mg | ORAL_TABLET | Freq: Three times a day (TID) | ORAL | 2 refills | Status: DC | PRN

## 2016-09-01 NOTE — Telephone Encounter (Signed)
[name] did not receive the 2nd Rx at her appt 07/29/16.  She will need the Rx for hydrocodone and ritalin and alprazolam. I have printed the prescriptions for Dr Naaman Plummer to sign. I also let [name] know that she may be required to come in monthly instead of every 2 months to get her prescriptions going forward in the future.

## 2016-09-06 ENCOUNTER — Encounter: Payer: Self-pay | Admitting: Endocrinology

## 2016-09-18 ENCOUNTER — Ambulatory Visit
Admission: RE | Admit: 2016-09-18 | Discharge: 2016-09-18 | Disposition: A | Discharge status: Home / Self Care | Payer: 59 | Source: Ambulatory Visit | Attending: Endocrinology | Admitting: Endocrinology

## 2016-09-18 DIAGNOSIS — Z1231 Encounter for screening mammogram for malignant neoplasm of breast: Secondary | ICD-10-CM

## 2016-09-22 ENCOUNTER — Encounter: Payer: 59 | Attending: Physical Medicine and Rehabilitation | Admitting: Physical Medicine & Rehabilitation

## 2016-09-22 ENCOUNTER — Encounter: Payer: Self-pay | Admitting: Physical Medicine & Rehabilitation

## 2016-09-22 VITALS — BP 123/78 | HR 96 | Resp 14

## 2016-09-22 DIAGNOSIS — M47817 Spondylosis without myelopathy or radiculopathy, lumbosacral region: Secondary | ICD-10-CM

## 2016-09-22 DIAGNOSIS — F411 Generalized anxiety disorder: Secondary | ICD-10-CM | POA: Diagnosis present

## 2016-09-22 DIAGNOSIS — M1288 Other specific arthropathies, not elsewhere classified, other specified site: Secondary | ICD-10-CM

## 2016-09-22 DIAGNOSIS — Z79899 Other long term (current) drug therapy: Secondary | ICD-10-CM

## 2016-09-22 DIAGNOSIS — F0781 Postconcussional syndrome: Secondary | ICD-10-CM

## 2016-09-22 DIAGNOSIS — M461 Sacroiliitis, not elsewhere classified: Secondary | ICD-10-CM | POA: Diagnosis present

## 2016-09-22 DIAGNOSIS — M47812 Spondylosis without myelopathy or radiculopathy, cervical region: Secondary | ICD-10-CM

## 2016-09-22 DIAGNOSIS — Z5181 Encounter for therapeutic drug level monitoring: Secondary | ICD-10-CM

## 2016-09-22 DIAGNOSIS — M47816 Spondylosis without myelopathy or radiculopathy, lumbar region: Secondary | ICD-10-CM

## 2016-09-22 MED ORDER — HYDROCODONE-ACETAMINOPHEN 10-325 MG PO TABS: 1.0000 | ORAL_TABLET | Freq: Four times a day (QID) | ORAL | 0 refills | Status: DC | PRN

## 2016-09-22 MED ORDER — METHYLPHENIDATE HCL 10 MG PO TABS: 10.0000 mg | ORAL_TABLET | Freq: Two times a day (BID) | ORAL | 0 refills | Status: DC

## 2016-09-22 NOTE — Patient Instructions (Signed)
PLEASE CALL ME WITH ANY PROBLEMS OR QUESTIONS ([phone])   HAPPY HOLIDAYS!!!!                    *                * *             *   *   *         *  *   *  *  *     *  *  *  *  *  *  * *  *  *  *  *  *  *  *  *  * *               *  *               *  *               *  *  

## 2016-09-22 NOTE — Addendum Note (Signed)
Addended by: Geryl Rankins D on: 09/22/2016 02:51 PM   Modules accepted: Orders

## 2016-09-22 NOTE — Progress Notes (Signed)
Subjective:    Patient ID: [name], female    DOB: [birth year], 60 y.o.   MRN: [id number]  HPI   [name] is here in follow up of her chronic pain. She developed some hematuria and a work up is underway. She was treated for a UTI, had a pelvic CT and is scheduled for a cystoscopy on 10/13/16. She has been feeling fairly otherwise other than some anxiety related to the above.   She remains on hydrocodone for pain control which remains effective. Her ritalin helps with mood and focus.   I reviewed recent cbc and iron panel which all were within normal limits.   Pain Inventory Average Pain 9 Pain Right Now 8 My pain is sharp, burning, dull, stabbing, tingling and aching  In the last 24 hours, has pain interfered with the following? General activity 10 Relation with others 10 Enjoyment of life 10 What TIME of day is your pain at its worst? n/a Sleep (in general) Poor  Pain is worse with: walking, bending, sitting, inactivity, standing, unsure and some activites Pain improves with: rest, heat/ice, pacing activities, medication and injections Relief from Meds: 7  Mobility use a cane how many minutes can you walk? "varies" ability to climb steps?  yes do you drive?  yes Do you have any goals in this area?  yes  Function disabled: date disabled 11/2008 I need assistance with the following:  meal prep, household duties and shopping Do you have any goals in this area?  yes  Neuro/Psych bladder control problems weakness numbness tremor tingling trouble walking spasms dizziness confusion depression anxiety loss of taste or smell  Prior Studies Any changes since last visit?  no  Physicians involved in your care Any changes since last visit?  yes   Family History  Problem Relation Age of Onset  . Cervical cancer Mother   . Diabetes Mother   . Stroke Mother   . Heart attack Father   . Stroke Father   . Colon cancer Father   . Anxiety disorder Maternal Aunt   .  Depression Maternal Aunt   . Anxiety disorder Maternal Uncle   . Depression Maternal Uncle     suicide attempt by gun shot wound to head. Survived  . Alcohol abuse Other   . Drug abuse Other   . Breast cancer Maternal Grandmother   . Diabetes Maternal Grandmother   . Breast cancer Paternal Aunt   . Colon polyps Sister     x 2   Social History   Social History  . Marital status: Married    Spouse name: N/A  . Number of children: 0  . Years of education: N/A   Occupational History  . disabled    Social History Main Topics  . Smoking status: Never Smoker  . Smokeless tobacco: Never Used  . Alcohol use No  . Drug use: No  . Sexual activity: Yes   Other Topics Concern  . None   Social History Narrative   Pt has h.s. degree   Past Surgical History:  Procedure Laterality Date  . ABDOMINAL HYSTERECTOMY  1999  . bunions removed  1988  . C4 C5 cage insertion  06/28/2013  . CHOLECYSTECTOMY  04/25/2008  . EDG  12/17/2004  . ELECTROCARDIOGRAM  05/27/2007  . L5 S1 rod insertion  11/07/2012  . SKIN CANCER EXCISION    . SKIN CANCER EXCISION     several  . TONSILLECTOMY  1981  . [place]  Past Medical History:  Diagnosis Date  . Anemia   . ANXIETY 06/10/2007  . ASYMPTOMATIC POSTMENOPAUSAL STATUS 07/19/2009  . Chronic headaches   . Cough 04/02/2009  . Degenerative arthritis of spine    back and neck  . Depression   . DYSLIPIDEMIA 06/28/2008  . Esophageal stricture   . Fibromyalgia   . Gallstones   . GERD 06/10/2007  . Hiatal hernia   . 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 HYPERGLYCEMIA 06/28/2008  . HYPERTENSION 06/10/2007  . Irritable bowel syndrome 06/28/2008  . OSTEOARTHRITIS 06/10/2007  . Pneumonia   . Skin cancer    basal cell  . Tubular adenoma of colon   . UPPER RESPIRATORY INFECTION 03/26/2009  . WEIGHT GAIN 01/21/2010   BP 123/78   Pulse 96   Resp 14   SpO2 96%   Opioid Risk Score:   Fall Risk Score:  `1  Depression screen PHQ 2/9  Depression screen [facility] 2/9  03/10/2016 06/10/2015 01/02/2015  Decreased Interest 0 1 3  Down, Depressed, Hopeless 1 1 1   PHQ - 2 Score 1 2 4   Altered sleeping - - 3  Tired, decreased energy - - 2  Change in appetite - - 2  Feeling bad or failure about yourself  - - 1  Trouble concentrating - - 3  Moving slowly or fidgety/restless - - 2  Suicidal thoughts - - 1  PHQ-9 Score - - 18  Some recent data might be hidden    Review of Systems  Constitutional: Positive for appetite change, chills and fever.       Night sweats  HENT: Negative.   Eyes: Negative.   Respiratory: Positive for cough and shortness of breath.        Respiratory infection  Gastrointestinal: Positive for abdominal pain and nausea.  Endocrine: Negative.   Genitourinary: Negative.   Musculoskeletal:       Limb swelling   Skin: Negative.   Allergic/Immunologic: Negative.   Neurological: Negative.   Hematological: Negative.   Psychiatric/Behavioral: Negative.   All other systems reviewed and are negative.      Objective:   Physical Exam  Constitutional: She is oriented to person, place, and time. She appears well-developed and well-nourished.  HENT:  Head: Normocephalic and atraumatic.  Eyes: Conjunctivae and EOM are normal. Pupils are equal, round, and reactive to light.  Neck: supple Cardiovascular: RRR  Pulmonary/Chest: clear without distress.  Abdominal: Soft. Bowel sounds are normal.  Musculoskeletal: left index finger tender at MCP---although improved. No crepitus. SIJ, PSIS's are painful. lower lumbar paraspinals are tight. Walks with cane for balance.  Neurological: memory and attention are better. Functional concentration . Strength symmetrical. Sensory is normal. DTR's 1+.  Skin: Skin is warm.  Psychiatric: affect bright.    Assessment & Plan:  ASSESSMENT:  1. 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 s/p L4-5 L5-S1 decompression stabilization with substantial mprovement of  her back and right leg pain.  2. Left knee meniscus injury.  3. Depression with anxiety. Much mproved with effexor and spiritual/social supports in place  4. Cervical spondylosis/Myofascial pain.- see below  5. Concussion on 05/13/14 with post concussion syndrome- has ongoing memory and concentration deficits.  6. 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 OA right knee/meniscus---good results with June injection 8. Left 2nd MCP injury from late June---mild tendonitis---responsive to injections 9. Hematuria: ?source    PLAN:  1. GU work up ongoing. ?GYN source of blood in urine.  2. Splint left index finger with activity. Gentle ROM and strengthening should continue 3. Hydrocodone will continue for breakthrough pain 10/325 q6 prn #120  We will continue the opioid monitoring program, this consists of regular clinic visits, examinations, urine drug screen, pill counts as well as use of [place] Controlled Substance Reporting System. 4. May continue melatonin to assist with sleep architecture  4. Elavil prn at bedtime.  5. Maintain ritalin to 10mg  tid for attention/arousal--refilled today #60 -continue adaptive strategies for memory and attention 6 .Follow up in about 1 months with me . 15 minutes of face to face patient care time were spent during this visit. All questions were encouraged and answered.

## 2016-09-28 LAB — TOXASSURE SELECT,+ANTIDEPR,UR

## 2016-10-02 NOTE — Progress Notes (Signed)
Urine drug screen for this encounter is consistent for prescribed medication 

## 2016-10-13 DIAGNOSIS — Z01419 Encounter for gynecological examination (general) (routine) without abnormal findings: Secondary | ICD-10-CM | POA: Diagnosis not present

## 2016-10-13 DIAGNOSIS — N952 Postmenopausal atrophic vaginitis: Secondary | ICD-10-CM | POA: Diagnosis not present

## 2016-10-14 LAB — HM PAP SMEAR: HM Pap smear: NORMAL

## 2016-10-16 DIAGNOSIS — R3121 Asymptomatic microscopic hematuria: Secondary | ICD-10-CM | POA: Diagnosis not present

## 2016-10-28 ENCOUNTER — Encounter: Payer: 59 | Admitting: Physical Medicine & Rehabilitation

## 2016-10-29 ENCOUNTER — Encounter: Payer: Self-pay | Admitting: Physical Medicine & Rehabilitation

## 2016-10-29 ENCOUNTER — Other Ambulatory Visit: Payer: Self-pay | Admitting: Physical Medicine & Rehabilitation

## 2016-10-29 DIAGNOSIS — F0781 Postconcussional syndrome: Secondary | ICD-10-CM

## 2016-10-29 DIAGNOSIS — M47817 Spondylosis without myelopathy or radiculopathy, lumbosacral region: Secondary | ICD-10-CM

## 2016-10-30 ENCOUNTER — Encounter: Payer: 59 | Attending: Physical Medicine and Rehabilitation | Admitting: Registered Nurse

## 2016-10-30 ENCOUNTER — Encounter: Payer: Self-pay | Admitting: Registered Nurse

## 2016-10-30 VITALS — BP 110/75 | HR 102 | Resp 14

## 2016-10-30 DIAGNOSIS — M47817 Spondylosis without myelopathy or radiculopathy, lumbosacral region: Secondary | ICD-10-CM | POA: Diagnosis not present

## 2016-10-30 DIAGNOSIS — F411 Generalized anxiety disorder: Secondary | ICD-10-CM | POA: Insufficient documentation

## 2016-10-30 DIAGNOSIS — M79645 Pain in left finger(s): Secondary | ICD-10-CM | POA: Diagnosis not present

## 2016-10-30 DIAGNOSIS — M461 Sacroiliitis, not elsewhere classified: Secondary | ICD-10-CM | POA: Insufficient documentation

## 2016-10-30 DIAGNOSIS — M25561 Pain in right knee: Secondary | ICD-10-CM | POA: Diagnosis not present

## 2016-10-30 DIAGNOSIS — G894 Chronic pain syndrome: Secondary | ICD-10-CM

## 2016-10-30 DIAGNOSIS — Z79899 Other long term (current) drug therapy: Secondary | ICD-10-CM

## 2016-10-30 DIAGNOSIS — Z5181 Encounter for therapeutic drug level monitoring: Secondary | ICD-10-CM

## 2016-10-30 DIAGNOSIS — F0781 Postconcussional syndrome: Secondary | ICD-10-CM

## 2016-10-30 MED ORDER — METHYLPHENIDATE HCL 10 MG PO TABS: 10.0000 mg | ORAL_TABLET | Freq: Two times a day (BID) | ORAL | 0 refills | Status: DC

## 2016-10-30 MED ORDER — CYCLOBENZAPRINE HCL 5 MG PO TABS: 5.0000 mg | ORAL_TABLET | Freq: Every evening | ORAL | 1 refills | Status: DC | PRN

## 2016-10-30 MED ORDER — HYDROCODONE-ACETAMINOPHEN 10-325 MG PO TABS: 1.0000 | ORAL_TABLET | Freq: Four times a day (QID) | ORAL | 0 refills | Status: DC | PRN

## 2016-10-30 NOTE — Progress Notes (Signed)
Subjective:    Patient ID: [name], female    DOB: [birth year], 61 y.o.   MRN: [id number]  HPI: [name] is a 61 year old female who returns for follow up appointment for chronic pain and medication refill. She states her pain is located in her left middle finger, lower back and right knee. She rates her pain 9. Her current exercise regime is walking and performing stretching exercises.  Pain Inventory Average Pain 8 Pain Right Now 9 My pain is sharp, burning, dull, stabbing, tingling and aching  In the last 24 hours, has pain interfered with the following? General activity 10 Relation with others 10 Enjoyment of life 10 What TIME of day is your pain at its worst? no selection Sleep (in general) Poor  Pain is worse with: walking, bending, sitting, inactivity, standing, unsure and some activites Pain improves with: rest, heat/ice, pacing activities, medication and injections Relief from Meds: 6  Mobility walk with assistance use a cane how many minutes can you walk? 30-60 ability to climb steps?  yes do you drive?  yes Do you have any goals in this area?  yes  Function disabled: date disabled . I need assistance with the following:  dressing, meal prep, household duties and shopping Do you have any goals in this area?  yes  Neuro/Psych bladder control problems weakness numbness tremor tingling trouble walking spasms dizziness confusion depression anxiety  Prior Studies Any changes since last visit?  no  Physicians involved in your care Any changes since last visit?  no   Family History  Problem Relation Age of Onset  . Cervical cancer Mother   . Diabetes Mother   . Stroke Mother   . Heart attack Father   . Stroke Father   . Colon cancer Father   . Anxiety disorder Maternal Aunt   . Depression Maternal Aunt   . Anxiety disorder Maternal Uncle   . Depression Maternal Uncle     suicide attempt by gun shot wound to head. Survived  .  Alcohol abuse Other   . Drug abuse Other   . Breast cancer Maternal Grandmother   . Diabetes Maternal Grandmother   . Breast cancer Paternal Aunt   . Colon polyps Sister     x 2   Social History   Social History  . Marital status: Married    Spouse name: N/A  . Number of children: 0  . Years of education: N/A   Occupational History  . disabled    Social History Main Topics  . Smoking status: Never Smoker  . Smokeless tobacco: Never Used  . Alcohol use No  . Drug use: No  . Sexual activity: Yes   Other Topics Concern  . None   Social History Narrative   Pt has h.s. degree   Past Surgical History:  Procedure Laterality Date  . ABDOMINAL HYSTERECTOMY  1999  . bunions removed  1988  . C4 C5 cage insertion  06/28/2013  . CHOLECYSTECTOMY  04/25/2008  . EDG  12/17/2004  . ELECTROCARDIOGRAM  05/27/2007  . L5 S1 rod insertion  11/07/2012  . SKIN CANCER EXCISION    . SKIN CANCER EXCISION     several  . TONSILLECTOMY  1981  . TUBAL LIGATION  1997   Past Medical History:  Diagnosis Date  . Anemia   . ANXIETY 06/10/2007  . ASYMPTOMATIC POSTMENOPAUSAL STATUS 07/19/2009  . Chronic headaches   . Cough 04/02/2009  . 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 Depression   . DYSLIPIDEMIA 06/28/2008  . Esophageal stricture   . Fibromyalgia   . Gallstones   . GERD 06/10/2007  . Hiatal hernia   . 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 HYPERGLYCEMIA 06/28/2008  . HYPERTENSION 06/10/2007  . Irritable bowel syndrome 06/28/2008  . OSTEOARTHRITIS 06/10/2007  . Pneumonia   . Skin cancer    basal cell  . Tubular adenoma of colon   . UPPER RESPIRATORY INFECTION 03/26/2009  . WEIGHT GAIN 01/21/2010   BP 110/75 (BP Location: Right Arm, Patient Position: Sitting, Cuff Size: Large)   Pulse (!) 102   Resp 14   SpO2 97%   Opioid Risk Score:   Fall Risk Score:  `1  Depression screen PHQ 2/9  Depression screen [facility] 2/9 03/10/2016 06/10/2015 01/02/2015  Decreased Interest 0 1 3  Down, Depressed,  Hopeless 1 1 1   PHQ - 2 Score 1 2 4   Altered sleeping - - 3  Tired, decreased energy - - 2  Change in appetite - - 2  Feeling bad or failure about yourself  - - 1  Trouble concentrating - - 3  Moving slowly or fidgety/restless - - 2  Suicidal thoughts - - 1  PHQ-9 Score - - 18  Some recent data might be hidden    Review of Systems  HENT: Negative.   Eyes: Negative.   Respiratory: Negative.   Gastrointestinal: Negative.   Endocrine: Negative.   Genitourinary: Negative.   Musculoskeletal:       Spasms  Skin: Negative.   Allergic/Immunologic: Negative.   Neurological: Positive for dizziness, tremors, weakness and numbness.       Tingling  Hematological: Negative.   Psychiatric/Behavioral: Positive for confusion and dysphoric mood. The patient is nervous/anxious.   All other systems reviewed and are negative.      Objective:   Physical Exam  Constitutional: She is oriented to person, place, and time. She appears well-developed and well-nourished.  HENT:  Head: Normocephalic and atraumatic.  Neck: Normal range of motion. Neck supple.  Cardiovascular: Normal rate and regular rhythm.   Pulmonary/Chest: Effort normal and breath sounds normal.  Musculoskeletal:  Normal Muscle Bulk and Muscle Testing Reveals:  Upper Extremities: Full ROM and Muscle Strength 5/5 Back without spinal tenderness Lower Extremities: Full ROM and Muscle Strength 5/5 Arises from chair with ease Narrow Based Gait  Neurological: She is alert and oriented to person, place, and time.  Skin: Skin is warm and dry.  Psychiatric: She has a normal mood and affect.  Nursing note and vitals reviewed.         Assessment & Plan:  1. History of chronic lumbar facet disease with spondylosis/DDD/ L4-5 spondylolisthesis.With L4-5 L5-S1 decompression stabilization: Refilled: Hydrocodone 10/325mg  one tablet every 6 hours as needed #120.  We will continue the opioid monitoring program, this consists of regular  clinic visits, examinations, urine drug screen, pill counts as well as use of [place] Controlled Substance Reporting System. 2. Depression with anxiety: Continue Xanax, and Effexor. F/U with Dr. Valentina Shaggy. 3. Cervical spondylosis: Stable at this time with no complaints. Continue Medication Regime.  4. Post Concussion Syndrome: Continue Ritalin 10 mg one tablet twice a day 0700 and noon #60. 5. Muscle Spasm: RX: Flexeril  20 minutes of face to face patient care time was spent during this visit. All questions were encouraged and answered.   F/U in 1 month

## 2016-11-09 ENCOUNTER — Telehealth: Payer: Self-pay | Admitting: *Deleted

## 2016-11-09 NOTE — Telephone Encounter (Signed)
Per Dr Naaman Plummer response to email request about flexeril, he said  we can rx flexeril 10mg , 1-2 qhs #60 2rf.  She had see Zella Ball 10/30/16 and she rx 5 mg #30.  If this does not work it can be increased to Dr Naaman Plummer order.

## 2016-11-21 ENCOUNTER — Other Ambulatory Visit: Payer: Self-pay | Admitting: Endocrinology

## 2016-11-25 ENCOUNTER — Encounter: Payer: Self-pay | Admitting: Registered Nurse

## 2016-11-25 ENCOUNTER — Telehealth: Payer: Self-pay | Admitting: Registered Nurse

## 2016-11-25 ENCOUNTER — Encounter: Payer: 59 | Attending: Physical Medicine and Rehabilitation | Admitting: Registered Nurse

## 2016-11-25 VITALS — BP 145/85 | HR 84

## 2016-11-25 DIAGNOSIS — F0781 Postconcussional syndrome: Secondary | ICD-10-CM

## 2016-11-25 DIAGNOSIS — M461 Sacroiliitis, not elsewhere classified: Secondary | ICD-10-CM | POA: Insufficient documentation

## 2016-11-25 DIAGNOSIS — G894 Chronic pain syndrome: Secondary | ICD-10-CM | POA: Diagnosis not present

## 2016-11-25 DIAGNOSIS — M47817 Spondylosis without myelopathy or radiculopathy, lumbosacral region: Secondary | ICD-10-CM | POA: Diagnosis not present

## 2016-11-25 DIAGNOSIS — Z5181 Encounter for therapeutic drug level monitoring: Secondary | ICD-10-CM

## 2016-11-25 DIAGNOSIS — M961 Postlaminectomy syndrome, not elsewhere classified: Secondary | ICD-10-CM | POA: Diagnosis not present

## 2016-11-25 DIAGNOSIS — F411 Generalized anxiety disorder: Secondary | ICD-10-CM | POA: Diagnosis present

## 2016-11-25 DIAGNOSIS — F4310 Post-traumatic stress disorder, unspecified: Secondary | ICD-10-CM

## 2016-11-25 DIAGNOSIS — Z79899 Other long term (current) drug therapy: Secondary | ICD-10-CM

## 2016-11-25 DIAGNOSIS — M47812 Spondylosis without myelopathy or radiculopathy, cervical region: Secondary | ICD-10-CM

## 2016-11-25 MED ORDER — METHYLPHENIDATE HCL 10 MG PO TABS: 10.0000 mg | ORAL_TABLET | Freq: Two times a day (BID) | ORAL | 0 refills | Status: DC

## 2016-11-25 MED ORDER — ALPRAZOLAM 0.5 MG PO TABS: 0.5000 mg | ORAL_TABLET | Freq: Three times a day (TID) | ORAL | 2 refills | Status: DC | PRN

## 2016-11-25 MED ORDER — CYCLOBENZAPRINE HCL 5 MG PO TABS: 5.0000 mg | ORAL_TABLET | Freq: Every evening | ORAL | 1 refills | Status: DC | PRN

## 2016-11-25 MED ORDER — HYDROCODONE-ACETAMINOPHEN 10-325 MG PO TABS: 1.0000 | ORAL_TABLET | Freq: Four times a day (QID) | ORAL | 0 refills | Status: DC | PRN

## 2016-11-25 NOTE — Telephone Encounter (Signed)
On 11/25/2016 the [place] was reviewed no conflict was seen on the [place] with multiple prescribers. Ms. Mascari has a signed narcotic contract with our office. If there were any discrepancies this would have been reported to her physician.

## 2016-11-25 NOTE — Progress Notes (Signed)
Subjective:    Patient ID: [name], female    DOB: October 29, 1955, 61 y.o.   MRN: [id number]  HPI: [name] is a 61 year old female who returns for follow up appointment for chronic pain and medication refill. She states her pain is located in her neck and lower back. She rates her pain 9. Her current exercise regime is walking and performing stretching exercises.  Also states she fell two weeks ago she was walking up her parent's porch when it was raining and slipped on the step. She fell forward and landed on her knees, her sister he;lped her up. She didn't seek medical attention.    Pain Inventory Average Pain 9 Pain Right Now 9 My pain is sharp, burning, dull, stabbing, tingling and aching  In the last 24 hours, has pain interfered with the following? General activity 10 Relation with others 10 Enjoyment of life 10 What TIME of day is your pain at its worst? . Sleep (in general) Poor  Pain is worse with: walking, bending, sitting, inactivity, standing, unsure and some activites Pain improves with: rest, heat/ice, therapy/exercise, pacing activities, medication and injections Relief from Meds: 6  Mobility walk without assistance walk with assistance use a cane ability to climb steps?  yes do you drive?  yes  Function disabled: date disabled 2015 I need assistance with the following:  dressing, meal prep, household duties and shopping  Neuro/Psych bladder control problems weakness numbness tremor tingling trouble walking spasms dizziness confusion depression anxiety loss of taste or smell  Prior Studies Any changes since last visit?  no  Physicians involved in your care Any changes since last visit?  no   Family History  Problem Relation Age of Onset  . Cervical cancer Mother   . Diabetes Mother   . Stroke Mother   . Heart attack Father   . Stroke Father   . Colon cancer Father   . Anxiety disorder Maternal Aunt   . Depression  Maternal Aunt   . Anxiety disorder Maternal Uncle   . Depression Maternal Uncle     suicide attempt by gun shot wound to head. Survived  . Alcohol abuse Other   . Drug abuse Other   . Breast cancer Maternal Grandmother   . Diabetes Maternal Grandmother   . Breast cancer Paternal Aunt   . Colon polyps Sister     x 2   Social History   Social History  . Marital status: Married    Spouse name: N/A  . Number of children: 0  . Years of education: N/A   Occupational History  . disabled    Social History Main Topics  . Smoking status: Never Smoker  . Smokeless tobacco: Never Used  . Alcohol use No  . Drug use: No  . Sexual activity: Yes   Other Topics Concern  . Not on file   Social History Narrative   Pt has h.s. degree   Past Surgical History:  Procedure Laterality Date  . ABDOMINAL HYSTERECTOMY  1999  . bunions removed  1988  . C4 C5 cage insertion  06/28/2013  . CHOLECYSTECTOMY  04/25/2008  . EDG  12/17/2004  . ELECTROCARDIOGRAM  05/27/2007  . L5 S1 rod insertion  11/07/2012  . SKIN CANCER EXCISION    . SKIN CANCER EXCISION     several  . TONSILLECTOMY  1981  . TUBAL LIGATION  1997   Past Medical History:  Diagnosis Date  . Anemia   .  ANXIETY 06/10/2007  . ASYMPTOMATIC POSTMENOPAUSAL STATUS 07/19/2009  . Chronic headaches   . Cough 04/02/2009  . Degenerative arthritis of spine    back and neck  . Depression   . DYSLIPIDEMIA 06/28/2008  . Esophageal stricture   . Fibromyalgia   . Gallstones   . GERD 06/10/2007  . Hiatal hernia   . 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 HYPERGLYCEMIA 06/28/2008  . HYPERTENSION 06/10/2007  . Irritable bowel syndrome 06/28/2008  . OSTEOARTHRITIS 06/10/2007  . Pneumonia   . Skin cancer    basal cell  . Tubular adenoma of colon   . UPPER RESPIRATORY INFECTION 03/26/2009  . WEIGHT GAIN 01/21/2010   There were no vitals taken for this visit.  Opioid Risk Score:   Fall Risk Score:  `1  Depression screen PHQ 2/9  Depression screen [facility] 2/9 03/10/2016  06/10/2015 01/02/2015  Decreased Interest 0 1 3  Down, Depressed, Hopeless 1 1 1   PHQ - 2 Score 1 2 4   Altered sleeping - - 3  Tired, decreased energy - - 2  Change in appetite - - 2  Feeling bad or failure about yourself  - - 1  Trouble concentrating - - 3  Moving slowly or fidgety/restless - - 2  Suicidal thoughts - - 1  PHQ-9 Score - - 18  Some recent data might be hidden    Review of Systems  Constitutional: Positive for fever and unexpected weight change.  HENT: Negative.   Eyes: Negative.   Respiratory: Negative.   Cardiovascular: Negative.   Gastrointestinal: Negative.   Endocrine: Negative.   Genitourinary: Negative.   Musculoskeletal: Negative.   Skin: Positive for rash.  Allergic/Immunologic: Negative.   Neurological: Negative.   Hematological: Negative.   Psychiatric/Behavioral: Negative.        Objective:   Physical Exam  Constitutional: She is oriented to person, place, and time. She appears well-developed and well-nourished.  HENT:  Head: Normocephalic and atraumatic.  Neck: Normal range of motion. Neck supple.  Cardiovascular: Normal rate and regular rhythm.   Pulmonary/Chest: Effort normal and breath sounds normal.  Musculoskeletal:  Normal Muscle Bulk and Muscle Testing Reveals: Upper Extremities: Full ROM and Muscle Strength 5/5 Thoracic Paraspinal Tenderness: T-4-T-6 Mainly Right Side Lumbar Paraspinal Tenderness: L-3-L-5 Lower Extremities: Full ROM and Muscle Strength 5/5 Arises from Table with ease Narrow Based Gait  Neurological: She is alert and oriented to person, place, and time.  Skin: Skin is warm and dry.  Psychiatric: She has a normal mood and affect.  Nursing note and vitals reviewed.         Assessment & Plan:  1. History of chronic lumbar facet disease with spondylosis/DDD/ L4-5 spondylolisthesis.With L4-5 L5-S1 decompression stabilization: 11/25/2016  Refilled: Hydrocodone 10/325mg  one tablet every 6 hours as needed #120.   We will continue the opioid monitoring program, this consists of regular clinic visits, examinations, urine drug screen, pill counts as well as use of [place] Controlled Substance Reporting System. 2. Depression with anxiety: Continue Xanax, and Effexor. F/U with Dr. Valentina Shaggy. 11/25/2016. 3. Cervical spondylosis: Stable at this time with no complaints. Continue Medication Regime. 11/25/2016 4. 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  Continue Ritalin 10 mg one tablet twice a day 0700 and noon #60.11/25/2016 5. Muscle Spasm: Continue  Flexeril: May take 1-2 tablets at HS  20 minutes of face to face patient care time was spent during this visit. All questions were encouraged and answered.  F/U in 1 month

## 2016-11-30 ENCOUNTER — Ambulatory Visit: Payer: 59 | Admitting: Physical Medicine & Rehabilitation

## 2016-12-18 ENCOUNTER — Other Ambulatory Visit: Payer: Self-pay

## 2016-12-18 ENCOUNTER — Other Ambulatory Visit: Payer: Self-pay | Admitting: Registered Nurse

## 2016-12-18 MED ORDER — VENLAFAXINE HCL ER 150 MG PO CP24: ORAL_CAPSULE | ORAL | 3 refills | Status: DC

## 2016-12-22 ENCOUNTER — Encounter: Payer: Self-pay | Admitting: Registered Nurse

## 2016-12-22 ENCOUNTER — Encounter: Payer: 59 | Attending: Physical Medicine and Rehabilitation | Admitting: Registered Nurse

## 2016-12-22 VITALS — BP 145/89 | HR 104

## 2016-12-22 DIAGNOSIS — M47817 Spondylosis without myelopathy or radiculopathy, lumbosacral region: Secondary | ICD-10-CM | POA: Diagnosis not present

## 2016-12-22 DIAGNOSIS — M961 Postlaminectomy syndrome, not elsewhere classified: Secondary | ICD-10-CM | POA: Diagnosis not present

## 2016-12-22 DIAGNOSIS — M461 Sacroiliitis, not elsewhere classified: Secondary | ICD-10-CM | POA: Diagnosis present

## 2016-12-22 DIAGNOSIS — Z5181 Encounter for therapeutic drug level monitoring: Secondary | ICD-10-CM

## 2016-12-22 DIAGNOSIS — F411 Generalized anxiety disorder: Secondary | ICD-10-CM

## 2016-12-22 DIAGNOSIS — F0781 Postconcussional syndrome: Secondary | ICD-10-CM | POA: Diagnosis not present

## 2016-12-22 DIAGNOSIS — F4310 Post-traumatic stress disorder, unspecified: Secondary | ICD-10-CM

## 2016-12-22 DIAGNOSIS — Z79899 Other long term (current) drug therapy: Secondary | ICD-10-CM

## 2016-12-22 DIAGNOSIS — G894 Chronic pain syndrome: Secondary | ICD-10-CM

## 2016-12-22 MED ORDER — METHYLPHENIDATE HCL 10 MG PO TABS: 10.0000 mg | ORAL_TABLET | Freq: Two times a day (BID) | ORAL | 0 refills | Status: DC

## 2016-12-22 MED ORDER — HYDROCODONE-ACETAMINOPHEN 10-325 MG PO TABS: 1.0000 | ORAL_TABLET | Freq: Four times a day (QID) | ORAL | 0 refills | Status: DC | PRN

## 2016-12-22 NOTE — Progress Notes (Signed)
Subjective:    Patient ID: [name], female    DOB: [birth year], 61 y.o.   MRN: [id number]  HPI: [name] is a 61year old female who returns for follow up appointmentfor chronic pain and medication refill. She states her pain is located in her lower back. She rates her pain 9. Her current exercise regime is walking and performing stretching exercises.  Also states her father has been hospitalized with a CVA, emotional support given.   Pain Inventory Average Pain 8 Pain Right Now 9 My pain is sharp, burning, dull, stabbing, tingling, aching and .  In the last 24 hours, has pain interfered with the following? General activity 10 Relation with others 10 Enjoyment of life 10 What TIME of day is your pain at its worst? . Sleep (in general) Poor  Pain is worse with: walking, bending, sitting, inactivity, standing, unsure and some activites Pain improves with: rest, heat/ice, pacing activities, medication and injections Relief from Meds: 6  Mobility use a cane ability to climb steps?  yes do you drive?  yes  Function disabled: date disabled 2015 I need assistance with the following:  dressing, meal prep, household duties and shopping  Neuro/Psych bladder control problems weakness numbness tremor tingling trouble walking spasms dizziness confusion depression anxiety loss of taste or smell  Prior Studies Any changes since last visit?  no  Physicians involved in your care Any changes since last visit?  no   Family History  Problem Relation Age of Onset  . Cervical cancer Mother   . Diabetes Mother   . Stroke Mother   . Heart attack Father   . Stroke Father   . Colon cancer Father   . Anxiety disorder Maternal Aunt   . Depression Maternal Aunt   . Anxiety disorder Maternal Uncle   . Depression Maternal Uncle     suicide attempt by gun shot wound to head. Survived  . Alcohol abuse Other   . Drug abuse Other   . Breast cancer Maternal  Grandmother   . Diabetes Maternal Grandmother   . Breast cancer Paternal Aunt   . Colon polyps Sister     x 2   Social History   Social History  . Marital status: Married    Spouse name: N/A  . Number of children: 0  . Years of education: N/A   Occupational History  . disabled    Social History Main Topics  . Smoking status: Never Smoker  . Smokeless tobacco: Never Used  . Alcohol use No  . Drug use: No  . Sexual activity: Yes   Other Topics Concern  . Not on file   Social History Narrative   Pt has h.s. degree   Past Surgical History:  Procedure Laterality Date  . ABDOMINAL HYSTERECTOMY  1999  . bunions removed  1988  . C4 C5 cage insertion  06/28/2013  . CHOLECYSTECTOMY  04/25/2008  . EDG  12/17/2004  . ELECTROCARDIOGRAM  05/27/2007  . L5 S1 rod insertion  11/07/2012  . SKIN CANCER EXCISION    . SKIN CANCER EXCISION     several  . TONSILLECTOMY  1981  . TUBAL LIGATION  1997   Past Medical History:  Diagnosis Date  . Anemia   . ANXIETY 06/10/2007  . ASYMPTOMATIC POSTMENOPAUSAL STATUS 07/19/2009  . Chronic headaches   . Cough 04/02/2009  . Degenerative arthritis of spine    back and neck  . Depression   . DYSLIPIDEMIA 06/28/2008  .  Esophageal stricture   . Fibromyalgia   . Gallstones   . GERD 06/10/2007  . Hiatal hernia   . 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 HYPERGLYCEMIA 06/28/2008  . HYPERTENSION 06/10/2007  . Irritable bowel syndrome 06/28/2008  . OSTEOARTHRITIS 06/10/2007  . Pneumonia   . Skin cancer    basal cell  . Tubular adenoma of colon   . UPPER RESPIRATORY INFECTION 03/26/2009  . WEIGHT GAIN 01/21/2010   There were no vitals taken for this visit.  Opioid Risk Score:   Fall Risk Score:  `1  Depression screen PHQ 2/9  Depression screen [facility] 2/9 03/10/2016 06/10/2015 01/02/2015  Decreased Interest 0 1 3  Down, Depressed, Hopeless 1 1 1   PHQ - 2 Score 1 2 4   Altered sleeping - - 3  Tired, decreased energy - - 2  Change in appetite - - 2  Feeling bad or failure  about yourself  - - 1  Trouble concentrating - - 3  Moving slowly or fidgety/restless - - 2  Suicidal thoughts - - 1  PHQ-9 Score - - 18  Some recent data might be hidden    Review of Systems  Constitutional: Negative.   HENT: Negative.   Eyes: Negative.   Respiratory: Negative.   Cardiovascular: Negative.   Gastrointestinal: Negative.   Endocrine: Negative.   Genitourinary: Negative.   Musculoskeletal: Negative.   Skin: Negative.   Allergic/Immunologic: Negative.   Neurological: Negative.   Hematological: Negative.   Psychiatric/Behavioral: Negative.   All other systems reviewed and are negative.      Objective:   Physical Exam  Constitutional: She is oriented to person, place, and time. She appears well-developed and well-nourished.  HENT:  Head: Normocephalic and atraumatic.  Neck: Normal range of motion. Neck supple.  Cardiovascular: Normal rate and regular rhythm.   Pulmonary/Chest: Effort normal and breath sounds normal.  Musculoskeletal:  Normal Muscle Bulk and Muscle Testing reveals: Upper Extremities: Full ROM and Muscle Strength 5/5 Lumbar Paraspinal Tenderness: L-3-L-5 Lower Extremities: Full ROM and Muscle Strength 5/5 Arises from Table with ease Narrow Based Gait  Neurological: She is alert and oriented to person, place, and time.  Skin: Skin is warm and dry.  Psychiatric: She has a normal mood and affect.  Nursing note and vitals reviewed.         Assessment & Plan:  1. History of chronic lumbar facet disease with spondylosis/DDD/ L4-5 spondylolisthesis.With L4-5 L5-S1 decompression stabilization: 12/22/2016  Refilled: Hydrocodone 10/325mg  one tablet every 6 hours as needed #120.  We will continue the opioid monitoring program, this consists of regular clinic visits, examinations, urine drug screen, pill counts as well as use of [place] Controlled Substance Reporting System. 2. Depression with anxiety: Continue Xanax, and Effexor. F/U with  Dr. Valentina Shaggy. 12/22/2016. 3. Cervical spondylosis: Stable at this time with no complaints. Continue Medication Regime. 12/22/2016 4. 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  Continue Ritalin 10 mg one tablet twice a day 0700 and noon #60.12/22/2016 5. Muscle Spasm: Continue  Flexeril: May take 1-2 tablets at HS. 12/22/2016  20  minutes of face to face patient care time was spent during this visit. All questions were encouraged and answered.   F/U in 1 month

## 2016-12-23 ENCOUNTER — Ambulatory Visit: Payer: 59 | Admitting: Registered Nurse

## 2016-12-29 ENCOUNTER — Ambulatory Visit: Payer: 59 | Admitting: Registered Nurse

## 2016-12-30 ENCOUNTER — Encounter: Payer: Self-pay | Admitting: Endocrinology

## 2016-12-30 ENCOUNTER — Other Ambulatory Visit: Payer: Self-pay | Admitting: Endocrinology

## 2016-12-30 DIAGNOSIS — N952 Postmenopausal atrophic vaginitis: Secondary | ICD-10-CM | POA: Diagnosis not present

## 2016-12-30 DIAGNOSIS — M47817 Spondylosis without myelopathy or radiculopathy, lumbosacral region: Secondary | ICD-10-CM

## 2016-12-30 DIAGNOSIS — R39198 Other difficulties with micturition: Secondary | ICD-10-CM | POA: Diagnosis not present

## 2016-12-30 DIAGNOSIS — I10 Essential (primary) hypertension: Secondary | ICD-10-CM

## 2016-12-30 DIAGNOSIS — N760 Acute vaginitis: Secondary | ICD-10-CM | POA: Diagnosis not present

## 2016-12-30 MED ORDER — LOSARTAN POTASSIUM-HCTZ 50-12.5 MG PO TABS: 0.5000 | ORAL_TABLET | Freq: Every day | ORAL | 3 refills | Status: DC

## 2016-12-30 MED ORDER — ATORVASTATIN CALCIUM 40 MG PO TABS: 40.0000 mg | ORAL_TABLET | Freq: Every day | ORAL | 2 refills | Status: DC

## 2016-12-30 MED ORDER — ACYCLOVIR 800 MG PO TABS: 800.0000 mg | ORAL_TABLET | Freq: Every day | ORAL | 3 refills | Status: DC

## 2017-01-08 ENCOUNTER — Other Ambulatory Visit: Payer: Self-pay | Admitting: Registered Nurse

## 2017-01-19 ENCOUNTER — Encounter: Payer: Self-pay | Admitting: Registered Nurse

## 2017-01-19 ENCOUNTER — Encounter: Payer: 59 | Attending: Physical Medicine and Rehabilitation | Admitting: Registered Nurse

## 2017-01-19 VITALS — BP 119/74 | HR 105

## 2017-01-19 DIAGNOSIS — M62838 Other muscle spasm: Secondary | ICD-10-CM

## 2017-01-19 DIAGNOSIS — M461 Sacroiliitis, not elsewhere classified: Secondary | ICD-10-CM | POA: Insufficient documentation

## 2017-01-19 DIAGNOSIS — F411 Generalized anxiety disorder: Secondary | ICD-10-CM

## 2017-01-19 DIAGNOSIS — F4310 Post-traumatic stress disorder, unspecified: Secondary | ICD-10-CM | POA: Diagnosis not present

## 2017-01-19 DIAGNOSIS — F3289 Other specified depressive episodes: Secondary | ICD-10-CM | POA: Diagnosis not present

## 2017-01-19 DIAGNOSIS — F0781 Postconcussional syndrome: Secondary | ICD-10-CM

## 2017-01-19 DIAGNOSIS — M961 Postlaminectomy syndrome, not elsewhere classified: Secondary | ICD-10-CM

## 2017-01-19 DIAGNOSIS — Z5181 Encounter for therapeutic drug level monitoring: Secondary | ICD-10-CM

## 2017-01-19 DIAGNOSIS — G894 Chronic pain syndrome: Secondary | ICD-10-CM

## 2017-01-19 DIAGNOSIS — M47817 Spondylosis without myelopathy or radiculopathy, lumbosacral region: Secondary | ICD-10-CM

## 2017-01-19 DIAGNOSIS — Z79899 Other long term (current) drug therapy: Secondary | ICD-10-CM

## 2017-01-19 MED ORDER — METHYLPHENIDATE HCL 10 MG PO TABS: 10.0000 mg | ORAL_TABLET | Freq: Two times a day (BID) | ORAL | 0 refills | Status: DC

## 2017-01-19 MED ORDER — ALPRAZOLAM 0.5 MG PO TABS: 0.5000 mg | ORAL_TABLET | Freq: Three times a day (TID) | ORAL | 2 refills | Status: DC | PRN

## 2017-01-19 MED ORDER — HYDROCODONE-ACETAMINOPHEN 10-325 MG PO TABS: 1.0000 | ORAL_TABLET | Freq: Four times a day (QID) | ORAL | 0 refills | Status: DC | PRN

## 2017-01-19 NOTE — Progress Notes (Signed)
Subjective:    Patient ID: [name], female    DOB: 1956/04/04, 61 y.o.   MRN: [id number]  HPI: [name] is a 61year old female who returns for follow up appointmentfor chronic pain and medication refill. She states her pain is located in her lower back.She rates her pain 9. Her current exercise regime is walking and performing stretching exercises.  [name] last UDS was on 09/22/16 it was consistent, she's scheduled for a UDS today.   Pain Inventory Average Pain 8 Pain Right Now 9 My pain is sharp, burning, dull, stabbing, tingling and aching  In the last 24 hours, has pain interfered with the following? General activity 10 Relation with others 10 Enjoyment of life 10 What TIME of day is your pain at its worst? all Sleep (in general) Poor  Pain is worse with: walking, bending, sitting, inactivity, standing and some activites Pain improves with: rest, heat/ice, pacing activities and medication Relief from Meds: 3  Mobility walk without assistance walk with assistance use a cane use a walker how many minutes can you walk? 30-60 ability to climb steps?  yes do you drive?  yes  Function disabled: date disabled 02/2014 I need assistance with the following:  dressing, meal prep, household duties and shopping  Neuro/Psych bladder control problems weakness numbness tremor tingling trouble walking spasms dizziness confusion depression anxiety loss of taste or smell  Prior Studies Any changes since last visit?  no  Physicians involved in your care Any changes since last visit?  no   Family History  Problem Relation Age of Onset  . Cervical cancer Mother   . Diabetes Mother   . Stroke Mother   . Heart attack Father   . Stroke Father   . Colon cancer Father   . Anxiety disorder Maternal Aunt   . Depression Maternal Aunt   . Anxiety disorder Maternal Uncle   . Depression Maternal Uncle     suicide attempt by gun shot wound to head.  Survived  . Alcohol abuse Other   . Drug abuse Other   . Breast cancer Maternal Grandmother   . Diabetes Maternal Grandmother   . Breast cancer Paternal Aunt   . Colon polyps Sister     x 2   Social History   Social History  . Marital status: Married    Spouse name: N/A  . Number of children: 0  . Years of education: N/A   Occupational History  . disabled    Social History Main Topics  . Smoking status: Never Smoker  . Smokeless tobacco: Never Used  . Alcohol use No  . Drug use: No  . Sexual activity: Yes   Other Topics Concern  . None   Social History Narrative   Pt has h.s. degree   Past Surgical History:  Procedure Laterality Date  . ABDOMINAL HYSTERECTOMY  1999  . bunions removed  1988  . C4 C5 cage insertion  06/28/2013  . CHOLECYSTECTOMY  04/25/2008  . EDG  12/17/2004  . ELECTROCARDIOGRAM  05/27/2007  . L5 S1 rod insertion  11/07/2012  . SKIN CANCER EXCISION    . SKIN CANCER EXCISION     several  . TONSILLECTOMY  1981  . TUBAL LIGATION  1997   Past Medical History:  Diagnosis Date  . Anemia   . ANXIETY 06/10/2007  . ASYMPTOMATIC POSTMENOPAUSAL STATUS 07/19/2009  . Chronic headaches   . Cough 04/02/2009  . 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 Depression   . DYSLIPIDEMIA 06/28/2008  . Esophageal stricture   . Fibromyalgia   . Gallstones   . GERD 06/10/2007  . Hiatal hernia   . 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 HYPERGLYCEMIA 06/28/2008  . HYPERTENSION 06/10/2007  . Irritable bowel syndrome 06/28/2008  . OSTEOARTHRITIS 06/10/2007  . Pneumonia   . Skin cancer    basal cell  . Tubular adenoma of colon   . UPPER RESPIRATORY INFECTION 03/26/2009  . WEIGHT GAIN 01/21/2010   BP 119/74   Pulse (!) 116   SpO2 96%   Opioid Risk Score:   Fall Risk Score:  `1  Depression screen PHQ 2/9  Depression screen [facility] 2/9 01/19/2017 03/10/2016 06/10/2015 01/02/2015  Decreased Interest 0 0 1 3  Down, Depressed, Hopeless 1 1 1 1   PHQ - 2 Score 1 1 2 4   Altered sleeping -  - - 3  Tired, decreased energy - - - 2  Change in appetite - - - 2  Feeling bad or failure about yourself  - - - 1  Trouble concentrating - - - 3  Moving slowly or fidgety/restless - - - 2  Suicidal thoughts - - - 1  PHQ-9 Score - - - 18  Some recent data might be hidden   Review of Systems  Constitutional: Positive for appetite change, chills and diaphoresis.  HENT: Negative.   Eyes: Negative.   Respiratory: Positive for cough and shortness of breath.   Cardiovascular: Positive for leg swelling.  Gastrointestinal: Positive for abdominal pain and nausea.  Endocrine:       Low blood sugar  Genitourinary: Positive for dyspareunia.  Musculoskeletal: Positive for gait problem.       Spasms  Skin: Positive for rash.  Neurological: Positive for dizziness, tremors, weakness and numbness.       Tingling  Hematological: Negative.   Psychiatric/Behavioral: Positive for confusion and dysphoric mood.  All other systems reviewed and are negative.      Objective:   Physical Exam  Constitutional: She is oriented to person, place, and time. She appears well-developed and well-nourished.  HENT:  Head: Normocephalic and atraumatic.  Neck: Normal range of motion. Neck supple.  Cardiovascular: Normal rate and regular rhythm.   Pulmonary/Chest: Effort normal and breath sounds normal.  Musculoskeletal:  Normal Muscle Bulk and Muscle Testing Reveals: Upper Extremities: Full ROM and Muscle Strength 5/5 Lumbar Paraspinal Tenderness: L-3-L-5 Lower Extremities: Full ROM and Muscle Strength 5/5 Arises from Table with ease Narrow Based Gait   Neurological: She is alert and oriented to person, place, and time.  Skin: Skin is warm and dry.  Psychiatric: She has a normal mood and affect.  Nursing note and vitals reviewed.         Assessment & Plan:  1. History of chronic lumbar facet disease with spondylosis/DDD/ L4-5 spondylolisthesis.With L4-5 L5-S1 decompression stabilization:  01/19/2017 Refilled: Hydrocodone 10/325mg  one tablet every 6 hours as needed #120.  We will continue the opioid monitoring program, this consists of regular clinic visits, examinations, urine drug screen, pill counts as well as use of [place] Controlled Substance Reporting System. 2. Depression with anxiety: Continue Xanax, and Effexor. 01/19/2017. 3. Cervical spondylosis: Stable at this time with no complaints. Continue Medication Regime. 01/19/2017 4. 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 Continue Ritalin 10 mg one tablet twice a day 0700 and noon #60.01/19/2017 5. Muscle Spasm: Continue Flexeril: May take 1-2 tablets at HS. 01/19/2017  15  minutes of face to face patient care time was spent during this visit. All questions were encouraged and answered.   F/U in 1 month

## 2017-01-22 LAB — TOXASSURE SELECT,+ANTIDEPR,UR

## 2017-01-28 ENCOUNTER — Telehealth: Payer: Self-pay | Admitting: *Deleted

## 2017-01-28 NOTE — Telephone Encounter (Signed)
Urine drug screen for this encounter is consistent for prescribed medication 

## 2017-02-02 ENCOUNTER — Encounter: Payer: Self-pay | Admitting: Physical Medicine & Rehabilitation

## 2017-02-08 ENCOUNTER — Encounter: Payer: Self-pay | Admitting: Endocrinology

## 2017-02-08 DIAGNOSIS — H53411 Scotoma involving central area, right eye: Secondary | ICD-10-CM | POA: Diagnosis not present

## 2017-02-08 DIAGNOSIS — H04123 Dry eye syndrome of bilateral lacrimal glands: Secondary | ICD-10-CM | POA: Diagnosis not present

## 2017-02-08 DIAGNOSIS — H35373 Puckering of macula, bilateral: Secondary | ICD-10-CM | POA: Diagnosis not present

## 2017-02-15 ENCOUNTER — Telehealth: Payer: Self-pay

## 2017-02-15 NOTE — Telephone Encounter (Signed)
-----   Message from Algernon Huxley, RN sent at 08/18/2016 10:16 AM EST ----- Regarding: Labs Pt needs labs, orders in epic

## 2017-02-15 NOTE — Telephone Encounter (Signed)
Reminder mailed to pt.

## 2017-02-18 ENCOUNTER — Telehealth: Payer: Self-pay | Admitting: Registered Nurse

## 2017-02-18 NOTE — Telephone Encounter (Signed)
On 02/18/2017 the  [name] was reviewed no conflict was seen on the [place] with multiple prescribers. [name] has a signed narcotic contract with our office. If there were any discrepancies this would have been reported to her physician.

## 2017-02-19 ENCOUNTER — Encounter: Payer: Self-pay | Admitting: Registered Nurse

## 2017-02-19 ENCOUNTER — Encounter: Payer: 59 | Attending: Physical Medicine and Rehabilitation | Admitting: Registered Nurse

## 2017-02-19 VITALS — BP 165/94 | HR 85

## 2017-02-19 DIAGNOSIS — M961 Postlaminectomy syndrome, not elsewhere classified: Secondary | ICD-10-CM | POA: Diagnosis not present

## 2017-02-19 DIAGNOSIS — F3289 Other specified depressive episodes: Secondary | ICD-10-CM

## 2017-02-19 DIAGNOSIS — Z5181 Encounter for therapeutic drug level monitoring: Secondary | ICD-10-CM | POA: Diagnosis not present

## 2017-02-19 DIAGNOSIS — F411 Generalized anxiety disorder: Secondary | ICD-10-CM | POA: Diagnosis present

## 2017-02-19 DIAGNOSIS — G894 Chronic pain syndrome: Secondary | ICD-10-CM

## 2017-02-19 DIAGNOSIS — F0781 Postconcussional syndrome: Secondary | ICD-10-CM

## 2017-02-19 DIAGNOSIS — F4310 Post-traumatic stress disorder, unspecified: Secondary | ICD-10-CM | POA: Diagnosis not present

## 2017-02-19 DIAGNOSIS — Z79899 Other long term (current) drug therapy: Secondary | ICD-10-CM | POA: Diagnosis not present

## 2017-02-19 DIAGNOSIS — M461 Sacroiliitis, not elsewhere classified: Secondary | ICD-10-CM | POA: Diagnosis not present

## 2017-02-19 DIAGNOSIS — M47817 Spondylosis without myelopathy or radiculopathy, lumbosacral region: Secondary | ICD-10-CM

## 2017-02-19 MED ORDER — METHYLPHENIDATE HCL 10 MG PO TABS: 10.0000 mg | ORAL_TABLET | Freq: Two times a day (BID) | ORAL | 0 refills | Status: DC

## 2017-02-19 MED ORDER — ALPRAZOLAM 0.5 MG PO TABS: 0.5000 mg | ORAL_TABLET | Freq: Three times a day (TID) | ORAL | 2 refills | Status: DC | PRN

## 2017-02-19 MED ORDER — HYDROCODONE-ACETAMINOPHEN 10-325 MG PO TABS
1.0000 | ORAL_TABLET | Freq: Four times a day (QID) | ORAL | 0 refills | Status: DC | PRN
Start: 2017-02-19 — End: 2017-03-24

## 2017-02-19 NOTE — Progress Notes (Signed)
Subjective:    Patient ID: [name], female    DOB: [birth year], 61 y.o.   MRN: [id number]  HPI: : [name] is a 61year old female who returns for follow up appointmentfor chronic pain and medication refill. She states her pain is located in her lower back, also states she has generalized pain all over.She rates her pain 10. Her current exercise regime is walking and performing stretching exercises.  [name] states her dad passed away on 08-Feb-2017, she states she had a suicidal thought during her grieving process and admits she was feeling stressed with family issues with her sister. She denies a plan, she will speak with Dr. Sima Matas,  Referral placed. She verbalizes understanding.   [name] last UDS was on 01/20/16 it was consistent.   Pain Inventory Average Pain 10 Pain Right Now 10 My pain is sharp, burning, dull, stabbing, tingling and aching  In the last 24 hours, has pain interfered with the following? General activity 10 Relation with others 10 Enjoyment of life 10 What TIME of day is your pain at its worst? n/a Sleep (in general) Poor  Pain is worse with: walking, bending, sitting, inactivity, standing and some activites Pain improves with: rest, heat/ice, pacing activities, medication and injections Relief from Meds: 9  Mobility walk without assistance walk with assistance use a cane how many minutes can you walk? 30-60 ability to climb steps?  yes do you drive?  yes Do you have any goals in this area?  yes  Function not employed: date last employed . disabled: date disabled . I need assistance with the following:  dressing, meal prep, household duties and shopping  Neuro/Psych weakness numbness tremor tingling trouble walking spasms dizziness confusion depression anxiety loss of taste or smell suicidal thoughts- no active plans  Prior Studies Any changes since last visit?  no  Physicians involved in your care Any  changes since last visit?  no   Family History  Problem Relation Age of Onset  . Cervical cancer Mother   . Diabetes Mother   . Stroke Mother   . Heart attack Father   . Stroke Father   . Colon cancer Father   . Anxiety disorder Maternal Aunt   . Depression Maternal Aunt   . Anxiety disorder Maternal Uncle   . Depression Maternal Uncle        suicide attempt by gun shot wound to head. Survived  . Alcohol abuse Other   . Drug abuse Other   . Breast cancer Maternal Grandmother   . Diabetes Maternal Grandmother   . Breast cancer Paternal Aunt   . Colon polyps Sister        x 2   Social History   Social History  . Marital status: Married    Spouse name: N/A  . Number of children: 0  . Years of education: N/A   Occupational History  . disabled    Social History Main Topics  . Smoking status: Never Smoker  . Smokeless tobacco: Never Used  . Alcohol use No  . Drug use: No  . Sexual activity: Yes   Other Topics Concern  . None   Social History Narrative   Pt has h.s. degree   Past Surgical History:  Procedure Laterality Date  . ABDOMINAL HYSTERECTOMY  1999  . bunions removed  1988  . C4 C5 cage insertion  06/28/2013  . CHOLECYSTECTOMY  04/25/2008  . EDG  12/17/2004  . ELECTROCARDIOGRAM  05/27/2007  .  L5 S1 rod insertion  11/07/2012  . SKIN CANCER EXCISION    . SKIN CANCER EXCISION     several  . TONSILLECTOMY  1981  . TUBAL LIGATION  1997   Past Medical History:  Diagnosis Date  . Anemia   . ANXIETY 06/10/2007  . ASYMPTOMATIC POSTMENOPAUSAL STATUS 07/19/2009  . Chronic headaches   . Cough 04/02/2009  . Degenerative arthritis of spine    back and neck  . Depression   . DYSLIPIDEMIA 06/28/2008  . Esophageal stricture   . Fibromyalgia   . Gallstones   . GERD 06/10/2007  . Hiatal hernia   . 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 HYPERGLYCEMIA 06/28/2008  . HYPERTENSION 06/10/2007  . Irritable bowel syndrome 06/28/2008  . OSTEOARTHRITIS 06/10/2007  . Pneumonia   . Skin cancer     basal cell  . Tubular adenoma of colon   . UPPER RESPIRATORY INFECTION 03/26/2009  . WEIGHT GAIN 01/21/2010   BP (!) 165/94 (BP Location: Left Arm, Patient Position: Sitting, Cuff Size: Normal)   Pulse 85   SpO2 96%   Opioid Risk Score:   Fall Risk Score:  `1  Depression screen PHQ 2/9  Depression screen [facility] 2/9 01/19/2017 03/10/2016 06/10/2015 01/02/2015  Decreased Interest 0 0 1 3  Down, Depressed, Hopeless 1 1 1 1   PHQ - 2 Score 1 1 2 4   Altered sleeping - - - 3  Tired, decreased energy - - - 2  Change in appetite - - - 2  Feeling bad or failure about yourself  - - - 1  Trouble concentrating - - - 3  Moving slowly or fidgety/restless - - - 2  Suicidal thoughts - - - 1  PHQ-9 Score - - - 18  Some recent data might be hidden     Review of Systems  Constitutional: Positive for appetite change and diaphoresis.  Eyes: Negative.   Respiratory: Positive for cough, shortness of breath and wheezing.   Cardiovascular: Negative.   Gastrointestinal: Positive for abdominal pain, diarrhea and nausea.  Endocrine: Negative.   Genitourinary: Negative.   Musculoskeletal: Positive for arthralgias, back pain, gait problem, myalgias, neck pain and neck stiffness.       Spasms   Skin: Positive for rash.  Allergic/Immunologic: Negative.   Neurological: Positive for dizziness, tremors, weakness and numbness.  Psychiatric/Behavioral: Positive for confusion and dysphoric mood. The patient is nervous/anxious.   All other systems reviewed and are negative.      Objective:   Physical Exam  Constitutional: She is oriented to person, place, and time. She appears well-developed and well-nourished.  HENT:  Head: Normocephalic and atraumatic.  Neck: Normal range of motion. Neck supple.  Cardiovascular: Normal rate and regular rhythm.   Pulmonary/Chest: Effort normal and breath sounds normal.  Musculoskeletal:  Normal Muscle Bulk and Muscle Testing Reveals: Upper Extremities: Full ROM and Muscle  Strength 5/5 Lumbar Paraspinal Tenderness: L-4-L-5 Lower Extremities: Full ROM and Muscle Strength 5/5 Arises from Table with ease Narrow Based Gait  Neurological: She is alert and oriented to person, place, and time.  Skin: Skin is warm and dry.  Psychiatric: She has a normal mood and affect.  Nursing note and vitals reviewed.         Assessment & Plan:  1. History of chronic lumbar facet disease with spondylosis/DDD/ L4-5 spondylolisthesis.With L4-5 L5-S1 decompression stabilization: 02/19/2017 Refilled: Hydrocodone 10/325mg  one tablet every 6 hours as needed #120.  We will continue the opioid monitoring program, this consists of regular clinic  visits, examinations, urine drug screen, pill counts as well as use of [place] Controlled Substance Reporting System. 2. Depression with anxiety: Referral Placed to Dr. Sima Matas.   Continue Xanax, and Effexor. 02/19/2017. 3. Cervical spondylosis: Stable at this time with no complaints. Continue Medication Regime. 02/19/2017 4. 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 Continue Ritalin 10 mg one tablet twice a day 0700 and noon #60.02/19/2017 5. Muscle Spasm: Continue Flexeril: May take 1-2 tablets at HS. 02/19/2017  20 minutes of face to face patient care time was spent during this visit. All questions were encouraged and answered.   F/U in 1 month

## 2017-02-23 ENCOUNTER — Other Ambulatory Visit: Payer: Self-pay | Admitting: Registered Nurse

## 2017-03-24 ENCOUNTER — Encounter: Payer: Self-pay | Admitting: Physical Medicine & Rehabilitation

## 2017-03-24 ENCOUNTER — Encounter: Payer: 59 | Attending: Physical Medicine and Rehabilitation | Admitting: Physical Medicine & Rehabilitation

## 2017-03-24 VITALS — BP 156/82 | HR 97

## 2017-03-24 DIAGNOSIS — M47817 Spondylosis without myelopathy or radiculopathy, lumbosacral region: Secondary | ICD-10-CM

## 2017-03-24 DIAGNOSIS — Z79899 Other long term (current) drug therapy: Secondary | ICD-10-CM

## 2017-03-24 DIAGNOSIS — I10 Essential (primary) hypertension: Secondary | ICD-10-CM

## 2017-03-24 DIAGNOSIS — G894 Chronic pain syndrome: Secondary | ICD-10-CM

## 2017-03-24 DIAGNOSIS — M47816 Spondylosis without myelopathy or radiculopathy, lumbar region: Secondary | ICD-10-CM

## 2017-03-24 DIAGNOSIS — Z5181 Encounter for therapeutic drug level monitoring: Secondary | ICD-10-CM | POA: Diagnosis not present

## 2017-03-24 DIAGNOSIS — F411 Generalized anxiety disorder: Secondary | ICD-10-CM | POA: Insufficient documentation

## 2017-03-24 DIAGNOSIS — M461 Sacroiliitis, not elsewhere classified: Secondary | ICD-10-CM | POA: Diagnosis present

## 2017-03-24 DIAGNOSIS — F0781 Postconcussional syndrome: Secondary | ICD-10-CM | POA: Diagnosis not present

## 2017-03-24 DIAGNOSIS — M4696 Unspecified inflammatory spondylopathy, lumbar region: Secondary | ICD-10-CM

## 2017-03-24 DIAGNOSIS — F4310 Post-traumatic stress disorder, unspecified: Secondary | ICD-10-CM

## 2017-03-24 MED ORDER — METHYLPHENIDATE HCL 10 MG PO TABS: 10.0000 mg | ORAL_TABLET | Freq: Two times a day (BID) | ORAL | 0 refills | Status: DC

## 2017-03-24 MED ORDER — HYDROCODONE-ACETAMINOPHEN 10-325 MG PO TABS: 1.0000 | ORAL_TABLET | Freq: Four times a day (QID) | ORAL | 0 refills | Status: DC | PRN

## 2017-03-24 NOTE — Patient Instructions (Signed)
PLEASE FEEL FREE TO CALL OUR OFFICE WITH ANY PROBLEMS OR QUESTIONS ([phone])      

## 2017-03-24 NOTE — Progress Notes (Signed)
Subjective:    Patient ID: [name], female    DOB: 11-13-1955, 61 y.o.   MRN: [id number]  HPI   [name] is here in follow up of her chronic pain. She is still struggling with grieving over her father's death with whom she was very close. There are also some acrimony taking place between her and siblings over his belongings. She feels that she's the "black sheep" of the family and has been for some time.   Her pain levels are stable to slightly increased. She feels that the situations noted above have contributed to stress levels and more depression as a whole. Additionally she has developed persistent nausea. She has spoken to her primary in this regard. She tells me she's moving her bowels regularly. Her medications really haven't changed.   [name] remains on hydrocodone for pain control. She uses ritalin for attention and focus.       Pain Inventory Average Pain 10 Pain Right Now 8 My pain is sharp, burning, dull, stabbing, tingling and aching  In the last 24 hours, has pain interfered with the following? General activity 10 Relation with others 10 Enjoyment of life 10 What TIME of day is your pain at its worst? . Sleep (in general) Poor  Pain is worse with: walking, bending, sitting, inactivity, standing and unsure Pain improves with: rest, heat/ice, pacing activities and medication Relief from Meds: 6  Mobility use a cane ability to climb steps?  yes do you drive?  yes  Function disabled: date disabled 2015 I need assistance with the following:  dressing, meal prep, household duties and shopping  Neuro/Psych bladder control problems weakness numbness tremor tingling trouble walking spasms dizziness confusion depression anxiety loss of taste or smell  Prior Studies Any changes since last visit?  no  Physicians involved in your care Any changes since last visit?  no   Family History  Problem Relation Age of Onset  . Cervical cancer Mother   .  Diabetes Mother   . Stroke Mother   . Heart attack Father   . Stroke Father   . Colon cancer Father   . Anxiety disorder Maternal Aunt   . Depression Maternal Aunt   . Anxiety disorder Maternal Uncle   . Depression Maternal Uncle        suicide attempt by gun shot wound to head. Survived  . Alcohol abuse Other   . Drug abuse Other   . Breast cancer Maternal Grandmother   . Diabetes Maternal Grandmother   . Breast cancer Paternal Aunt   . Colon polyps Sister        x 2   Social History   Social History  . Marital status: Married    Spouse name: N/A  . Number of children: 0  . Years of education: N/A   Occupational History  . disabled    Social History Main Topics  . Smoking status: Never Smoker  . Smokeless tobacco: Never Used  . Alcohol use No  . Drug use: No  . Sexual activity: Yes   Other Topics Concern  . Not on file   Social History Narrative   Pt has h.s. degree   Past Surgical History:  Procedure Laterality Date  . ABDOMINAL HYSTERECTOMY  1999  . bunions removed  1988  . C4 C5 cage insertion  06/28/2013  . CHOLECYSTECTOMY  04/25/2008  . EDG  12/17/2004  . ELECTROCARDIOGRAM  05/27/2007  . L5 S1 rod insertion  11/07/2012  . SKIN  CANCER EXCISION    . SKIN CANCER EXCISION     several  . TONSILLECTOMY  1981  . TUBAL LIGATION  1997   Past Medical History:  Diagnosis Date  . Anemia   . ANXIETY 06/10/2007  . ASYMPTOMATIC POSTMENOPAUSAL STATUS 07/19/2009  . Chronic headaches   . Cough 04/02/2009  . Degenerative arthritis of spine    back and neck  . Depression   . DYSLIPIDEMIA 06/28/2008  . Esophageal stricture   . Fibromyalgia   . Gallstones   . GERD 06/10/2007  . Hiatal hernia   . 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 HYPERGLYCEMIA 06/28/2008  . HYPERTENSION 06/10/2007  . Irritable bowel syndrome 06/28/2008  . OSTEOARTHRITIS 06/10/2007  . Pneumonia   . Skin cancer    basal cell  . Tubular adenoma of colon   . UPPER RESPIRATORY INFECTION 03/26/2009  . WEIGHT GAIN 01/21/2010    There were no vitals taken for this visit.  Opioid Risk Score:   Fall Risk Score:  `1  Depression screen PHQ 2/9  Depression screen [facility] 2/9 01/19/2017 03/10/2016 06/10/2015 01/02/2015  Decreased Interest 0 0 1 3  Down, Depressed, Hopeless 1 1 1 1   PHQ - 2 Score 1 1 2 4   Altered sleeping - - - 3  Tired, decreased energy - - - 2  Change in appetite - - - 2  Feeling bad or failure about yourself  - - - 1  Trouble concentrating - - - 3  Moving slowly or fidgety/restless - - - 2  Suicidal thoughts - - - 1  PHQ-9 Score - - - 18  Some recent data might be hidden    Review of Systems  Constitutional: Positive for appetite change, diaphoresis and unexpected weight change.  HENT: Negative.   Eyes: Negative.   Respiratory: Positive for cough.   Cardiovascular: Negative.   Gastrointestinal: Positive for abdominal pain, diarrhea and nausea.  Endocrine: Negative.   Genitourinary: Negative.   Musculoskeletal: Positive for joint swelling.  Skin: Negative.   Allergic/Immunologic: Negative.   Neurological: Negative.   Hematological: Bruises/bleeds easily.  Psychiatric/Behavioral: Negative.   All other systems reviewed and are negative.      Objective:   Physical Exam Constitutional: She is oriented to person, place, and time. She appears well-developed and well-nourished.  HENT:  Head: Normocephalic and atraumatic.  Eyes: Conjunctivae and EOM are normal. Pupils are equal, round, and reactive to light.  Neck: supple Cardiovascular: RRR  Pulmonary/Chest: CTA B  Abdominal: Soft. Bowel sounds are normal.  Musculoskeletal: left index finger less tender at MCP.  SIJ, PSIS's are painful. lower lumbar paraspinals are tight. Walks with cane for balance.  Neurological: memory and attention are better. Functional concentration . Strength symmetrical. Sensory is normal. DTR's 1+.  Skin: Skin is warm.  Psychiatric: affect bright.     Assessment & Plan:  ASSESSMENT:  1. History of  chronic lumbar facet disease with spondylosis/DDD/ L4-5 spondylolisthesis. s/p L4-5 L5-S1 decompression stabilization with substantial mprovement of her back and right leg pain.  2. Left knee meniscus injury.  3. Depression with anxiety. Much mproved with effexor and spiritual/social supports in place  4. Cervical spondylosis/Myofascial pain.- see below  5. Concussion on 05/13/14 with post concussion syndrome- has ongoing memory and concentration deficits.  6. 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 OA right knee/meniscus---good results with June injection 8. Left 2nd MCP injury from late June---mild tendonitis---responsive to injections 9. Hematuria: ?source    PLAN:  1. Stomach upset. I suspect this related to her mood issue. She has been on the same medications for some time now.  2. Left index finger stable to improved  3. Hydrocodone will continue for breakthrough pain 10/325 q6 prn #120. We will continue the opioid monitoring program, this consists of regular clinic visits, examinations, urine drug screen, pill counts as well as use of [place] Controlled Substance Reporting System. [place] was reviewed today.   -UDS today 4. Follow up with Dr. Sima Matas next week to address mood/coping skills. I believe with improvement of her coping skills as well as mood and anxiety her other somatic problems will improve. We also discussed that her BI/Concussion also leave her prone to a bit more emotional sensitivity.  4. Elavil prn at bedtime.  5. Maintain ritalin to 10mg  tid for attention/arousal--refilled today #60 - 6 .Follow up in about 1 months with NP. 66minutes of face to face patient care time were spent during this visit. All questions were encouraged and answered.

## 2017-03-30 LAB — TOXASSURE SELECT,+ANTIDEPR,UR

## 2017-03-31 ENCOUNTER — Encounter (HOSPITAL_BASED_OUTPATIENT_CLINIC_OR_DEPARTMENT_OTHER): Payer: 59 | Admitting: Psychology

## 2017-03-31 ENCOUNTER — Telehealth: Payer: Self-pay | Admitting: *Deleted

## 2017-03-31 DIAGNOSIS — F411 Generalized anxiety disorder: Secondary | ICD-10-CM | POA: Diagnosis not present

## 2017-03-31 DIAGNOSIS — F0781 Postconcussional syndrome: Secondary | ICD-10-CM | POA: Diagnosis not present

## 2017-03-31 DIAGNOSIS — F4310 Post-traumatic stress disorder, unspecified: Secondary | ICD-10-CM | POA: Diagnosis not present

## 2017-03-31 DIAGNOSIS — M47817 Spondylosis without myelopathy or radiculopathy, lumbosacral region: Secondary | ICD-10-CM | POA: Diagnosis not present

## 2017-03-31 DIAGNOSIS — G894 Chronic pain syndrome: Secondary | ICD-10-CM

## 2017-03-31 NOTE — Telephone Encounter (Signed)
Urine drug screen for this encounter is consistent for prescribed medication 

## 2017-04-04 ENCOUNTER — Other Ambulatory Visit: Payer: Self-pay | Admitting: Internal Medicine

## 2017-04-05 NOTE — Telephone Encounter (Signed)
3 month rx denied until pt makes office visit.

## 2017-04-09 ENCOUNTER — Other Ambulatory Visit: Payer: Self-pay | Admitting: Registered Nurse

## 2017-04-19 ENCOUNTER — Telehealth: Payer: Self-pay | Admitting: Physical Medicine & Rehabilitation

## 2017-04-19 DIAGNOSIS — F0781 Postconcussional syndrome: Secondary | ICD-10-CM

## 2017-04-19 DIAGNOSIS — M47817 Spondylosis without myelopathy or radiculopathy, lumbosacral region: Secondary | ICD-10-CM

## 2017-04-19 MED ORDER — HYDROCODONE-ACETAMINOPHEN 10-325 MG PO TABS: 1.0000 | ORAL_TABLET | Freq: Four times a day (QID) | ORAL | 0 refills | Status: DC | PRN

## 2017-04-19 MED ORDER — METHYLPHENIDATE HCL 10 MG PO TABS: 10.0000 mg | ORAL_TABLET | Freq: Two times a day (BID) | ORAL | 0 refills | Status: DC

## 2017-04-19 NOTE — Telephone Encounter (Signed)
Patient will run out of medication before she comes back in on 04/28/17.  She said it will run out this Friday.  If someone could please call her.

## 2017-04-19 NOTE — Telephone Encounter (Signed)
Rx printed for Dr Naaman Plummer to sign. Maciel notified. She will keep appt on 04/28/17.

## 2017-04-28 ENCOUNTER — Encounter: Payer: Self-pay | Admitting: Registered Nurse

## 2017-04-28 ENCOUNTER — Encounter: Payer: 59 | Attending: Physical Medicine and Rehabilitation | Admitting: Registered Nurse

## 2017-04-28 VITALS — BP 112/80 | HR 97

## 2017-04-28 DIAGNOSIS — Z5181 Encounter for therapeutic drug level monitoring: Secondary | ICD-10-CM

## 2017-04-28 DIAGNOSIS — F411 Generalized anxiety disorder: Secondary | ICD-10-CM

## 2017-04-28 DIAGNOSIS — M47817 Spondylosis without myelopathy or radiculopathy, lumbosacral region: Secondary | ICD-10-CM

## 2017-04-28 DIAGNOSIS — M461 Sacroiliitis, not elsewhere classified: Secondary | ICD-10-CM | POA: Diagnosis not present

## 2017-04-28 DIAGNOSIS — F4310 Post-traumatic stress disorder, unspecified: Secondary | ICD-10-CM | POA: Diagnosis not present

## 2017-04-28 DIAGNOSIS — G894 Chronic pain syndrome: Secondary | ICD-10-CM

## 2017-04-28 DIAGNOSIS — F0781 Postconcussional syndrome: Secondary | ICD-10-CM

## 2017-04-28 DIAGNOSIS — M961 Postlaminectomy syndrome, not elsewhere classified: Secondary | ICD-10-CM

## 2017-04-28 DIAGNOSIS — Z79899 Other long term (current) drug therapy: Secondary | ICD-10-CM | POA: Diagnosis not present

## 2017-04-28 MED ORDER — HYDROCODONE-ACETAMINOPHEN 10-325 MG PO TABS: 1.0000 | ORAL_TABLET | Freq: Four times a day (QID) | ORAL | 0 refills | Status: DC | PRN

## 2017-04-28 MED ORDER — ALPRAZOLAM 0.5 MG PO TABS: 0.5000 mg | ORAL_TABLET | Freq: Three times a day (TID) | ORAL | 2 refills | Status: DC | PRN

## 2017-04-28 MED ORDER — METHYLPHENIDATE HCL 10 MG PO TABS: 10.0000 mg | ORAL_TABLET | Freq: Two times a day (BID) | ORAL | 0 refills | Status: DC

## 2017-04-28 NOTE — Progress Notes (Signed)
Subjective:    Patient ID: [name], female    DOB: 1955-11-27, 61 y.o.   MRN: [id number]  HPI: [name] is a 61year old female who returns for follow up appointmentfor chronic pain and medication refill. She states her pain is located in her lower back.She rates her pain 9. Her current exercise regime is walking and performing stretching exercises.  [name] last UDS was on 03/24/17 it was consistent.   Pain Inventory Average Pain 8 Pain Right Now 9 My pain is sharp, burning, dull, stabbing, tingling and aching  In the last 24 hours, has pain interfered with the following? General activity 10 Relation with others 10 Enjoyment of life 10 What TIME of day is your pain at its worst? varies Sleep (in general) NA  Pain is worse with: walking, bending, sitting, inactivity, standing and some activites Pain improves with: rest, pacing activities, medication and injections Relief from Meds: 8  Mobility how many minutes can you walk? 30 ability to climb steps?  yes do you drive?  yes  Function disabled: date disabled 2015 I need assistance with the following:  feeding, meal prep, household duties and shopping  Neuro/Psych bladder control problems weakness numbness tremor tingling trouble walking spasms dizziness confusion depression anxiety loss of taste or smell  Prior Studies Any changes since last visit?  no  Physicians involved in your care Any changes since last visit?  no   Family History  Problem Relation Age of Onset  . Cervical cancer Mother   . Diabetes Mother   . Stroke Mother   . Heart attack Father   . Stroke Father   . Colon cancer Father   . Anxiety disorder Maternal Aunt   . Depression Maternal Aunt   . Anxiety disorder Maternal Uncle   . Depression Maternal Uncle        suicide attempt by gun shot wound to head. Survived  . Alcohol abuse Other   . Drug abuse Other   . Breast cancer Maternal Grandmother   . Diabetes  Maternal Grandmother   . Breast cancer Paternal Aunt   . Colon polyps Sister        x 2   Social History   Social History  . Marital status: Married    Spouse name: N/A  . Number of children: 0  . Years of education: N/A   Occupational History  . disabled    Social History Main Topics  . Smoking status: Never Smoker  . Smokeless tobacco: Never Used  . Alcohol use No  . Drug use: No  . Sexual activity: Yes   Other Topics Concern  . None   Social History Narrative   Pt has h.s. degree   Past Surgical History:  Procedure Laterality Date  . ABDOMINAL HYSTERECTOMY  1999  . bunions removed  1988  . C4 C5 cage insertion  06/28/2013  . CHOLECYSTECTOMY  04/25/2008  . EDG  12/17/2004  . ELECTROCARDIOGRAM  05/27/2007  . L5 S1 rod insertion  11/07/2012  . SKIN CANCER EXCISION    . SKIN CANCER EXCISION     several  . TONSILLECTOMY  1981  . TUBAL LIGATION  1997   Past Medical History:  Diagnosis Date  . Anemia   . ANXIETY 06/10/2007  . ASYMPTOMATIC POSTMENOPAUSAL STATUS 07/19/2009  . Chronic headaches   . Cough 04/02/2009  . Degenerative arthritis of spine    back and neck  . Depression   . DYSLIPIDEMIA 06/28/2008  .  Esophageal stricture   . Fibromyalgia   . Gallstones   . GERD 06/10/2007  . Hiatal hernia   . 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 HYPERGLYCEMIA 06/28/2008  . HYPERTENSION 06/10/2007  . Irritable bowel syndrome 06/28/2008  . OSTEOARTHRITIS 06/10/2007  . Pneumonia   . Skin cancer    basal cell  . Tubular adenoma of colon   . UPPER RESPIRATORY INFECTION 03/26/2009  . WEIGHT GAIN 01/21/2010   BP 112/80   Pulse 97   SpO2 94%   Opioid Risk Score:  1 Fall Risk Score:  `1  Depression screen PHQ 2/9  Depression screen [facility] 2/9 04/28/2017 01/19/2017 03/10/2016 06/10/2015 01/02/2015  Decreased Interest 1 0 0 1 3  Down, Depressed, Hopeless 1 1 1 1 1   PHQ - 2 Score 2 1 1 2 4   Altered sleeping - - - - 3  Tired, decreased energy - - - - 2  Change in appetite - - - - 2  Feeling bad or  failure about yourself  - - - - 1  Trouble concentrating - - - - 3  Moving slowly or fidgety/restless - - - - 2  Suicidal thoughts - - - - 1  PHQ-9 Score - - - - 18  Some recent data might be hidden   Review of Systems  HENT: Negative.   Eyes: Negative.   Respiratory: Negative.   Cardiovascular: Negative.   Gastrointestinal: Negative.   Endocrine: Negative.   Genitourinary: Positive for urgency.  Musculoskeletal: Positive for gait problem.       Spasms  Skin: Negative.   Allergic/Immunologic: Negative.   Neurological: Positive for dizziness, tremors, weakness and numbness.       Tingling  Hematological: Negative.   Psychiatric/Behavioral: Positive for confusion and dysphoric mood. The patient is nervous/anxious.   All other systems reviewed and are negative.      Objective:   Physical Exam  Constitutional: She is oriented to person, place, and time. She appears well-developed and well-nourished.  HENT:  Head: Normocephalic and atraumatic.  Neck: Normal range of motion. Neck supple.  Cardiovascular: Normal rate and regular rhythm.   Pulmonary/Chest: Effort normal and breath sounds normal.  Musculoskeletal:  Normal Muscle Bulk and Muscle Testing Reveals: Upper Extremities: Full ROM and Muscle Strength 5/5 Lumbar Paraspinal Tenderness: L-3-L-5 Lower Extremities: Full ROM and Muscle Strength 5/5 Arises from Table with ease Narrow Based Gait  Neurological: She is alert and oriented to person, place, and time.  Skin: Skin is warm and dry.  Psychiatric: She has a normal mood and affect.  Nursing note and vitals reviewed.         Assessment & Plan:  1. History of chronic lumbar facet disease with spondylosis/DDD/ L4-5 spondylolisthesis.With L4-5 L5-S1 decompression stabilization: 04/28/2017 Refilled: Hydrocodone 10/325mg  one tablet every 6 hours as needed #120.  We will continue the opioid monitoring program, this consists of regular clinic visits, examinations, urine  drug screen, pill counts as well as use of [place] Controlled Substance Reporting System. 2. Depression with anxiety:  Dr. Sima Matas Following.   Continue Xanax, and Effexor. 04/28/2017. 3. Cervical spondylosis: Stable at this time with no complaints. Continue Medication Regime. 04/28/2017 4. 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 Continue Ritalin 10 mg one tablet twice a day 0700 and noon #60.04/28/2017 5. Muscle Spasm: Continue Flexeril: May take 1-2 tablets at HS. 04/28/2017   20 minutes of face to face patient care time was spent during this visit. All questions were  encouraged and answered.    F/U in 1 month

## 2017-05-05 ENCOUNTER — Telehealth: Payer: Self-pay | Admitting: Registered Nurse

## 2017-05-10 ENCOUNTER — Encounter (HOSPITAL_BASED_OUTPATIENT_CLINIC_OR_DEPARTMENT_OTHER): Payer: 59 | Admitting: Psychology

## 2017-05-10 ENCOUNTER — Encounter: Payer: Self-pay | Admitting: Psychology

## 2017-05-10 DIAGNOSIS — F411 Generalized anxiety disorder: Secondary | ICD-10-CM | POA: Diagnosis not present

## 2017-05-10 DIAGNOSIS — M47817 Spondylosis without myelopathy or radiculopathy, lumbosacral region: Secondary | ICD-10-CM | POA: Diagnosis not present

## 2017-05-10 DIAGNOSIS — F0781 Postconcussional syndrome: Secondary | ICD-10-CM | POA: Diagnosis not present

## 2017-05-10 DIAGNOSIS — F4310 Post-traumatic stress disorder, unspecified: Secondary | ICD-10-CM

## 2017-05-10 DIAGNOSIS — G894 Chronic pain syndrome: Secondary | ICD-10-CM | POA: Diagnosis not present

## 2017-05-10 NOTE — Progress Notes (Signed)
Neuropsychological Consultation   Patient:   [name]   DOB:   [birth year]  MR Number:  [id number]  Location:  [facility] [address], [place] [id number] [place] [address] Dept: [phone]           Date of Service:   03/31/2017  Start Time:   2 PM End Time:   3 PM  Provider/Observer:  Ilean Skill, Psy.D.       Clinical Neuropsychologist       Billing Code/Service: [phone] 4 Units  Chief Complaint:    The patient has been dealing with significant anxiety and depression along with a history of PTSD and postconcussion syndrome. The patient was referred for psychotherapeutic interventions primarily because of the issues of depression and coping with family stress.  Reason for Service:  [name] is a 61 year old female referred by Dr. Naaman Plummer due to issues of coping and dealing with a number of stressors. She has a history of postconcussion syndrome, chronic pain syndrome as well as PTSD.  Current Status:  The patient describes significant difficulty with issues related to her family and the family estate. There were issues when her father passed away where an older sister was in charge of everything and all of his possessions were been ago to this older sister. However, the middle sister began having trouble and when the patient could not help the sister rejected the patient. There've been a number of family conflicts going on including a niece who got in trouble with doing drugs and her excuse to the patient's sister was that the patient was the one who started her doing drugs and giving her money. This is not accurate.  Reliability of Information:   Behavioral Observation: [name]  presents as a 61 y.o.-year-old Right Caucasian Female who appeared her stated age. her dress was Appropriate and she was Well Groomed and her manners were Appropriate to the  situation.  her participation was indicative of Appropriate and Attentive behaviors.  There were any physical disabilities noted.  she displayed an appropriate level of cooperation and motivation.     Interactions:    Active Appropriate and Attentive  Attention:   abnormal and attention span appeared shorter than expected for age  Memory:   abnormal; remote memory intact, recent memory impaired  Visuo-spatial:  within normal limits  Speech (Volume):  normal  Speech:   normal;   Thought Process:  Coherent and Relevant  Though Content:  Rumination; not suicidal  Orientation:   person, place, time/date and situation  Judgment:   Poor  Planning:   Poor  Affect:    Anxious and Depressed  Mood:    Anxious and Depressed  Insight:   Fair  Intelligence:   normal  Medical History:   Past Medical History:  Diagnosis Date  . Anemia   . ANXIETY 06/10/2007  . ASYMPTOMATIC POSTMENOPAUSAL STATUS 07/19/2009  . Chronic headaches   . Cough 04/02/2009  . Degenerative arthritis of spine    back and neck  . Depression   . DYSLIPIDEMIA 06/28/2008  . Esophageal stricture   . Fibromyalgia   . Gallstones   . GERD 06/10/2007  . Hiatal hernia   . 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 HYPERGLYCEMIA 06/28/2008  . HYPERTENSION 06/10/2007  . Irritable bowel syndrome 06/28/2008  . OSTEOARTHRITIS 06/10/2007  . Pneumonia   . Skin cancer    basal cell  . Tubular adenoma of colon   .  UPPER RESPIRATORY INFECTION 03/26/2009  . WEIGHT GAIN 01/21/2010            Abuse/Trauma History: The patient has had a number of traumas in her life including experiencing a significant concussive event as well as issues related to posttraumatic stress disorder.  Family Med/Psych History:  Family History  Problem Relation Age of Onset  . Cervical cancer Mother   . Diabetes Mother   . Stroke Mother   . Heart attack Father   . Stroke Father   . Colon cancer Father   . Anxiety disorder Maternal Aunt   . Depression Maternal Aunt   .  Anxiety disorder Maternal Uncle   . Depression Maternal Uncle        suicide attempt by gun shot wound to head. Survived  . Alcohol abuse Other   . Drug abuse Other   . Breast cancer Maternal Grandmother   . Diabetes Maternal Grandmother   . Breast cancer Paternal Aunt   . Colon polyps Sister        x 2    Risk of Suicide/Violence: low the patient denies any current suicidal or homicidal ideation but does acknowledge times where she has felt overwhelmed by various family stressors.  Impression/DX:  [name] is a 61 year old female referred by Dr. Naaman Plummer due to issues of coping and dealing with a number of stressors. She has a history of postconcussion syndrome, chronic pain syndrome as well as PTSD.  The patient describes significant difficulty with issues related to her family and the family estate. There were issues when her father passed away where an older sister was in charge of everything and all of his possessions were been ago to this older sister. However, the middle sister began having trouble and when the patient could not help the sister rejected the patient. There've been a number of family conflicts going on including a niece who got in trouble with doing drugs and her excuse to the patient's sister was that the patient was the one who started her doing drugs and giving her money. This is not accurate.  Disposition/Plan:  We will set the patient up for individual therapeutic interventions to help build better coping skills and strategies around her PTSD, anxiety and depressive symptoms.  Diagnosis:    PTSD (post-traumatic stress disorder)  Post concussion syndrome  Chronic pain syndrome  GAD (generalized anxiety disorder)         Electronically Signed   _______________________ Ilean Skill, Psy.D.

## 2017-05-10 NOTE — Progress Notes (Signed)
Patient:  Ople Girgis   DOB: [birth year]  MR Number: [id number]  Location: [facility] [address] [place] [address] Dept: [phone]  Start: 3 PM End: 3:57 PM  Provider/Observer:     Edgardo Roys PSYD  Chief Complaint:      Chief Complaint  Patient presents with  . Post-Traumatic Stress Disorder  . Pain  . Depression  . Memory Loss  . Anxiety    Reason For Service:      Meher Kucinski is a 61 year old female referred by Dr. Naaman Plummer due to issues of coping and dealing with a number of stressors. She has a history of postconcussion syndrome, chronic pain syndrome as well as PTSD.  The patient has been dealing with significant anxiety and depression along with a history of PTSD and postconcussion syndrome. The patient was referred for psychotherapeutic interventions primarily because of the issues of depression and coping with family stress.  Interventions Strategy:  Cognitive/behavioral psychotherapeutic interventions  Participation Level:   Active  Participation Quality:  Appropriate and Attentive      Behavioral Observation:  Well Groomed, Alert, and Appropriate.   Current Psychosocial Factors: The patient reports that she has taken some of the advice a week suggested during the first visit and has been talking with other family members and developing relationships outside of the sister that she was having so much conflict with her or her niece. She is actually been invited to a number of family gatherings where she discovered that many family members feel very negative towards her sister and the patient was shocked to find out how supportive they have all been of her.  Content of Session:   Reviewed current symptoms and work on therapeutic interventions around issues associated with depression anxiety and relationship to her long-term  posttraumatic stress disorder.  Current Status:   The patient reports that she has been doing much better and feeling much less anxiety and depressive type symptoms.  Patient Progress:   Very good  Impression/Diagnosis:   Gemini Bunte is a 61 year old female referred by Dr. Naaman Plummer due to issues of coping and dealing with a number of stressors. She has a history of postconcussion syndrome, chronic pain syndrome as well as PTSD.  The patient describes significant difficulty with issues related to her family and the family estate. There were issues when her father passed away where an older sister was in charge of everything and all of his possessions were been ago to this older sister. However, the middle sister began having trouble and when the patient could not help the sister rejected the patient. There've been a number of family conflicts going on including a niece who got in trouble with doing drugs and her excuse to the patient's sister was that the patient was the one who started her doing drugs and giving her money. This is not accurate.  Diagnosis:   PTSD (post-traumatic stress disorder)  Post concussion syndrome  Chronic pain syndrome  GAD (generalized anxiety disorder)

## 2017-05-10 NOTE — Progress Notes (Signed)
Patient:                           Sayler Mickiewicz           DOB:                               1956-01-18  MR Number:                  [id number]  Location:                        [facility] [address] [place] [address] Dept: [phone]  Start:                              3 PM End:                                3:57 PM  Provider/Observer:                           Edgardo Roys PSYD  Chief Complaint:                                   Chief Complaint  Patient presents with  . Post-Traumatic Stress Disorder  . Pain  . Depression  . Memory Loss  . Anxiety    Reason For Service:                         Yurani Fettes is a 61 year old female referred by Dr. Naaman Plummer due to issues of coping and dealing with a number of stressors. She has a history of postconcussion syndrome, chronic pain syndrome as well as PTSD.  The patient has been dealing with significant anxiety and depression along with a history of PTSD and postconcussion syndrome. The patient was referred for psychotherapeutic interventions primarily because of the issues of depression and coping with family stress.  Interventions Strategy:                    Cognitive/behavioral psychotherapeutic interventions  Participation Level:                           Active  Participation Quality:                       Appropriate and Attentive                            Behavioral Observation:                  Well Groomed, Alert, and Appropriate.   Current Psychosocial Factors:       The patient reports that she has taken some of the advice a week suggested during the first visit and has been talking with other family members and developing relationships outside of the sister that she was having so much conflict with her or her niece. She is  actually been invited to a number of family  gatherings where she discovered that many family members feel very negative towards her sister and the patient was shocked to find out how supportive they have all been of her.  Content of Session:                          Reviewed current symptoms and work on therapeutic interventions around issues associated with depression anxiety and relationship to her long-term posttraumatic stress disorder.  Current Status:                                  The patient reports that she has been doing much better and feeling much less anxiety and depressive type symptoms.  Patient Progress:                              Very good  Impression/Diagnosis:                     Layana Konkel is a 61 year old female referred by Dr. Naaman Plummer due to issues of coping and dealing with a number of stressors. She has a history of postconcussion syndrome, chronic pain syndrome as well as PTSD. The patient describes significant difficulty with issues related to her family and the family estate. There were issues when her father passed away where an older sister was in charge of everything and all of his possessions were been ago to this older sister. However, the middle sister began having trouble and when the patient could not help the sister rejected the patient. There've been a number of family conflicts going on including a niece who got in trouble with doing drugs and her excuse to the patient's sister was that the patient was the one who started her doing drugs and giving her money. This is not accurate.   Diagnosis:   Chronic pain syndrome  Post concussion syndrome  PTSD (post-traumatic stress disorder)  GAD (generalized anxiety disorder)

## 2017-05-11 NOTE — Telephone Encounter (Signed)
error 

## 2017-05-21 ENCOUNTER — Other Ambulatory Visit: Payer: Self-pay | Admitting: Registered Nurse

## 2017-06-02 ENCOUNTER — Ambulatory Visit: Payer: 59 | Admitting: Registered Nurse

## 2017-06-03 ENCOUNTER — Encounter: Payer: 59 | Attending: Physical Medicine and Rehabilitation | Admitting: Registered Nurse

## 2017-06-03 ENCOUNTER — Encounter: Payer: Self-pay | Admitting: Registered Nurse

## 2017-06-03 VITALS — BP 123/79 | HR 85

## 2017-06-03 DIAGNOSIS — M47817 Spondylosis without myelopathy or radiculopathy, lumbosacral region: Secondary | ICD-10-CM

## 2017-06-03 DIAGNOSIS — G894 Chronic pain syndrome: Secondary | ICD-10-CM | POA: Diagnosis not present

## 2017-06-03 DIAGNOSIS — Z79899 Other long term (current) drug therapy: Secondary | ICD-10-CM

## 2017-06-03 DIAGNOSIS — F0781 Postconcussional syndrome: Secondary | ICD-10-CM | POA: Diagnosis not present

## 2017-06-03 DIAGNOSIS — F411 Generalized anxiety disorder: Secondary | ICD-10-CM | POA: Diagnosis not present

## 2017-06-03 DIAGNOSIS — F4310 Post-traumatic stress disorder, unspecified: Secondary | ICD-10-CM | POA: Diagnosis not present

## 2017-06-03 DIAGNOSIS — F3289 Other specified depressive episodes: Secondary | ICD-10-CM | POA: Diagnosis not present

## 2017-06-03 DIAGNOSIS — Z5181 Encounter for therapeutic drug level monitoring: Secondary | ICD-10-CM

## 2017-06-03 DIAGNOSIS — M961 Postlaminectomy syndrome, not elsewhere classified: Secondary | ICD-10-CM | POA: Diagnosis not present

## 2017-06-03 DIAGNOSIS — M461 Sacroiliitis, not elsewhere classified: Secondary | ICD-10-CM | POA: Insufficient documentation

## 2017-06-03 MED ORDER — HYDROCODONE-ACETAMINOPHEN 10-325 MG PO TABS: 1.0000 | ORAL_TABLET | Freq: Four times a day (QID) | ORAL | 0 refills | Status: DC | PRN

## 2017-06-03 MED ORDER — METHYLPHENIDATE HCL 10 MG PO TABS: 10.0000 mg | ORAL_TABLET | Freq: Two times a day (BID) | ORAL | 0 refills | Status: DC

## 2017-06-03 NOTE — Progress Notes (Signed)
Subjective:    Patient ID: [name], female    DOB: 1955-12-18, 61 y.o.   MRN: [id number]  HPI: [name] is a 61year old female who returns for follow up appointmentfor chronic pain and medication refill. She states her pain is located in her lower back.She rates her pain 10. Her current exercise regime is walking and performing stretching exercises.  [name] last UDS was on 03/24/17 it was consistent.   Pain Inventory Average Pain 9 Pain Right Now 10 My pain is sharp, burning, dull, stabbing, tingling and aching  In the last 24 hours, has pain interfered with the following? General activity 10 Relation with others 10 Enjoyment of life 10 What TIME of day is your pain at its worst? . Sleep (in general) Poor  Pain is worse with: walking, bending, sitting, inactivity, standing, unsure and some activites Pain improves with: rest, heat/ice, therapy/exercise, pacing activities, medication, TENS and injections Relief from Meds: 6  Mobility walk without assistance walk with assistance use a cane ability to climb steps?  yes do you drive?  yes  Function disabled: date disabled 2015 I need assistance with the following:  meal prep, household duties and shopping  Neuro/Psych bladder control problems weakness numbness tremor tingling trouble walking spasms dizziness confusion depression anxiety loss of taste or smell  Prior Studies Any changes since last visit?  no  Physicians involved in your care Any changes since last visit?  no   Family History  Problem Relation Age of Onset  . Cervical cancer Mother   . Diabetes Mother   . Stroke Mother   . Heart attack Father   . Stroke Father   . Colon cancer Father   . Anxiety disorder Maternal Aunt   . Depression Maternal Aunt   . Anxiety disorder Maternal Uncle   . Depression Maternal Uncle        suicide attempt by gun shot wound to head. Survived  . Alcohol abuse Other   . Drug abuse  Other   . Breast cancer Maternal Grandmother   . Diabetes Maternal Grandmother   . Breast cancer Paternal Aunt   . Colon polyps Sister        x 2   Social History   Social History  . Marital status: Married    Spouse name: N/A  . Number of children: 0  . Years of education: N/A   Occupational History  . disabled    Social History Main Topics  . Smoking status: Never Smoker  . Smokeless tobacco: Never Used  . Alcohol use No  . Drug use: No  . Sexual activity: Yes   Other Topics Concern  . Not on file   Social History Narrative   Pt has h.s. degree   Past Surgical History:  Procedure Laterality Date  . ABDOMINAL HYSTERECTOMY  1999  . bunions removed  1988  . C4 C5 cage insertion  06/28/2013  . CHOLECYSTECTOMY  04/25/2008  . EDG  12/17/2004  . ELECTROCARDIOGRAM  05/27/2007  . L5 S1 rod insertion  11/07/2012  . SKIN CANCER EXCISION    . SKIN CANCER EXCISION     several  . TONSILLECTOMY  1981  . TUBAL LIGATION  1997   Past Medical History:  Diagnosis Date  . Anemia   . ANXIETY 06/10/2007  . ASYMPTOMATIC POSTMENOPAUSAL STATUS 07/19/2009  . Chronic headaches   . Cough 04/02/2009  . Degenerative arthritis of spine    back and neck  .  Depression   . DYSLIPIDEMIA 06/28/2008  . Esophageal stricture   . Fibromyalgia   . Gallstones   . GERD 06/10/2007  . Hiatal hernia   . 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 HYPERGLYCEMIA 06/28/2008  . HYPERTENSION 06/10/2007  . Irritable bowel syndrome 06/28/2008  . OSTEOARTHRITIS 06/10/2007  . Pneumonia   . Skin cancer    basal cell  . Tubular adenoma of colon   . UPPER RESPIRATORY INFECTION 03/26/2009  . WEIGHT GAIN 01/21/2010   There were no vitals taken for this visit.  Opioid Risk Score:   Fall Risk Score:  `1  Depression screen PHQ 2/9  Depression screen [facility] 2/9 04/28/2017 01/19/2017 03/10/2016 06/10/2015 01/02/2015  Decreased Interest 1 0 0 1 3  Down, Depressed, Hopeless 1 1 1 1 1   PHQ - 2 Score 2 1 1 2 4   Altered sleeping - - - - 3  Tired,  decreased energy - - - - 2  Change in appetite - - - - 2  Feeling bad or failure about yourself  - - - - 1  Trouble concentrating - - - - 3  Moving slowly or fidgety/restless - - - - 2  Suicidal thoughts - - - - 1  PHQ-9 Score - - - - 18  Some recent data might be hidden     Review of Systems  Constitutional: Positive for appetite change, diaphoresis and unexpected weight change.  HENT: Negative.   Eyes: Negative.   Respiratory: Negative.   Cardiovascular: Negative.   Gastrointestinal: Positive for abdominal pain, diarrhea, nausea and vomiting.  Endocrine: Negative.   Genitourinary: Negative.   Musculoskeletal: Negative.   Skin: Positive for rash.  Allergic/Immunologic: Negative.   Neurological: Negative.   Hematological: Bruises/bleeds easily.  Psychiatric/Behavioral: Negative.        Objective:   Physical Exam  Constitutional: She is oriented to person, place, and time. She appears well-developed and well-nourished.  HENT:  Head: Normocephalic and atraumatic.  Neck: Normal range of motion. Neck supple.  Cardiovascular: Normal rate and regular rhythm.   Pulmonary/Chest: Effort normal and breath sounds normal.  Musculoskeletal:  Normal Muscle Bulk and Muscle Testing Reveals: Upper Extremities: Full ROM and Muscle Strength 5/5 Lumbar Paraspinal Tenderness: L-3-L-5 Lower Extremities: Full ROM and Muscle Strength 5/5 Arises from Table with ease Narrow Based Gait   Neurological: She is alert and oriented to person, place, and time.  Skin: Skin is warm and dry.  Psychiatric: She has a normal mood and affect.  Nursing note and vitals reviewed.         Assessment & Plan:  1. History of chronic lumbar facet disease with spondylosis/DDD/ L4-5 spondylolisthesis.With L4-5 L5-S1 decompression stabilization: 06/03/2017 Refilled: Hydrocodone 10/325mg  one tablet every 6 hours as needed #120.  We will continue the opioid monitoring program, this consists of regular clinic  visits, examinations, urine drug screen, pill counts as well as use of [place] Controlled Substance Reporting System. 2. Depression with anxiety:  Dr. Sima Matas Following.  Continue Xanax, and Effexor. 06/03/2017. 3. Cervical spondylosis: Stable at this time with no complaints. Continue Medication Regime. 06/03/2017 4. 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 Continue Ritalin 10 mg one tablet twice a day 0700 and noon #60.06/03/2017 5. Muscle Spasm: Continue Flexeril: May take 1-2 tablets at HS. 06/03/2017  20 minutes of face to face patient care time was spent during this visit. All questions were encouraged and answered.   F/U in 1 month

## 2017-06-04 ENCOUNTER — Telehealth: Payer: Self-pay | Admitting: Registered Nurse

## 2017-06-04 NOTE — Telephone Encounter (Signed)
On 06/04/2017 the  [place] was reviewed no conflict was seen on the [place] with multiple prescribers. [name] has a signed narcotic contract with our office. If there were any discrepancies this would have been reported to her physician.

## 2017-06-17 ENCOUNTER — Encounter: Payer: 59 | Attending: Physical Medicine and Rehabilitation | Admitting: Psychology

## 2017-06-17 DIAGNOSIS — F4310 Post-traumatic stress disorder, unspecified: Secondary | ICD-10-CM

## 2017-06-17 DIAGNOSIS — M47817 Spondylosis without myelopathy or radiculopathy, lumbosacral region: Secondary | ICD-10-CM | POA: Insufficient documentation

## 2017-06-17 DIAGNOSIS — F411 Generalized anxiety disorder: Secondary | ICD-10-CM | POA: Diagnosis present

## 2017-06-17 DIAGNOSIS — M961 Postlaminectomy syndrome, not elsewhere classified: Secondary | ICD-10-CM

## 2017-06-17 DIAGNOSIS — F0781 Postconcussional syndrome: Secondary | ICD-10-CM

## 2017-06-17 DIAGNOSIS — M461 Sacroiliitis, not elsewhere classified: Secondary | ICD-10-CM | POA: Diagnosis present

## 2017-07-04 ENCOUNTER — Encounter: Payer: Self-pay | Admitting: Psychology

## 2017-07-04 NOTE — Progress Notes (Signed)
Patient:                           [name]           DOB:                               [birth year]  MR Number:                  [id number]  Location:                        [facility] [address], [place] [id number] [place] [address] Dept: [phone]  Start:  1 PM End:                                 2 PM  Provider/Observer:                           Edgardo Roys PSYD  Chief Complaint:                                   Chief Complaint  Patient presents with  . Post-Traumatic Stress Disorder  . Pain  . Depression  . Memory Loss  . Anxiety    Reason For Service:                         [name] is a 61 year old female referred by Dr. Naaman Plummer due to issues of coping and dealing with a number of stressors. She has a history of postconcussion syndrome, chronic pain syndrome as well as PTSD.  The patient has been dealing with significant anxiety and depression along with a history of PTSD and postconcussion syndrome. The patient was referred for psychotherapeutic interventions primarily because of the issues of depression and coping with family stress.  Interventions Strategy:                    Cognitive/behavioral psychotherapeutic interventions  Participation Level:                           Active  Participation Quality:                       Appropriate and Attentive                            Behavioral Observation:                  Well Groomed, Alert, and Appropriate.   Current Psychosocial Factors:       The patient reports that she has Continued to work on psychosocial connection dealing with some of the impact her PTSD and anxiety/depression of her overall coping strategies. She reports that she is anxiety and depressive symptoms  Content of Session:                          Reviewed current symptoms and work on therapeutic  interventions  around issues associated with depression anxiety and relationship to her long-term posttraumatic stress disorder.  Current Status:                                  The patient reports that she has been doing much better and feeling much less anxiety and depressive type symptoms.  Patient Progress:                              Very good  Impression/Diagnosis:                     [name] is a 61 year old female referred by Dr. Naaman Plummer due to issues of coping and dealing with a number of stressors. She has a history of postconcussion syndrome, chronic pain syndrome as well as PTSD. The patient describes significant difficulty with issues related to her family and the family estate. There were issues when her father passed away where an older sister was in charge of everything and all of his possessions were been ago to this older sister. However, the middle sister began having trouble and when the patient could not help the sister rejected the patient. There've been a number of family conflicts going on including a niece who got in trouble with doing drugs and her excuse to the patient's sister was that the patient was the one who started her doing drugs and giving her money. This is not accurate.   Diagnosis:   Lumbar post-laminectomy syndrome  PTSD (post-traumatic stress disorder)  GAD (generalized anxiety disorder)  Post concussion syndrome

## 2017-07-06 ENCOUNTER — Encounter (HOSPITAL_BASED_OUTPATIENT_CLINIC_OR_DEPARTMENT_OTHER): Payer: 59 | Admitting: Registered Nurse

## 2017-07-06 ENCOUNTER — Encounter: Payer: Self-pay | Admitting: Registered Nurse

## 2017-07-06 VITALS — BP 126/80 | HR 89

## 2017-07-06 DIAGNOSIS — M47817 Spondylosis without myelopathy or radiculopathy, lumbosacral region: Secondary | ICD-10-CM

## 2017-07-06 DIAGNOSIS — F3289 Other specified depressive episodes: Secondary | ICD-10-CM

## 2017-07-06 DIAGNOSIS — F4310 Post-traumatic stress disorder, unspecified: Secondary | ICD-10-CM | POA: Diagnosis not present

## 2017-07-06 DIAGNOSIS — G894 Chronic pain syndrome: Secondary | ICD-10-CM | POA: Diagnosis not present

## 2017-07-06 DIAGNOSIS — F411 Generalized anxiety disorder: Secondary | ICD-10-CM

## 2017-07-06 DIAGNOSIS — M961 Postlaminectomy syndrome, not elsewhere classified: Secondary | ICD-10-CM

## 2017-07-06 DIAGNOSIS — Z79899 Other long term (current) drug therapy: Secondary | ICD-10-CM | POA: Diagnosis not present

## 2017-07-06 DIAGNOSIS — F0781 Postconcussional syndrome: Secondary | ICD-10-CM | POA: Diagnosis not present

## 2017-07-06 DIAGNOSIS — Z5181 Encounter for therapeutic drug level monitoring: Secondary | ICD-10-CM

## 2017-07-06 MED ORDER — HYDROCODONE-ACETAMINOPHEN 10-325 MG PO TABS: 1.0000 | ORAL_TABLET | Freq: Four times a day (QID) | ORAL | 0 refills | Status: DC | PRN

## 2017-07-06 MED ORDER — ALPRAZOLAM 0.5 MG PO TABS: 0.5000 mg | ORAL_TABLET | Freq: Three times a day (TID) | ORAL | 2 refills | Status: DC | PRN

## 2017-07-06 MED ORDER — METHYLPHENIDATE HCL 10 MG PO TABS: 10.0000 mg | ORAL_TABLET | Freq: Two times a day (BID) | ORAL | 0 refills | Status: DC

## 2017-07-06 NOTE — Progress Notes (Signed)
Subjective:    Patient ID: [name], female    DOB: 05-Dec-1955, 61 y.o.   MRN: [id number]  HPI: [name] is a 61year old female who returns for follow up appointmentfor chronic pain and medication refill. She states her pain is located in her lower back and occasionally it radiates into her left lower extremity.She rates her pain 9. Her current exercise regime is walking and performing stretching exercises.  [name] last UDS was on 06/13/18it was consistent.   Pain Inventory Average Pain 9 Pain Right Now 9 My pain is sharp, burning, dull, stabbing, tingling and aching  In the last 24 hours, has pain interfered with the following? General activity 10 Relation with others 10 Enjoyment of life 10 What TIME of day is your pain at its worst? . Sleep (in general) .  Pain is worse with: walking, bending, sitting, inactivity, standing, unsure and some activites Pain improves with: rest, heat/ice, medication and injections Relief from Meds: 6  Mobility use a cane ability to climb steps?  yes do you drive?  yes  Function disabled: date disabled 2015 I need assistance with the following:  dressing, meal prep, household duties and shopping  Neuro/Psych bladder control problems weakness numbness tremor tingling trouble walking spasms dizziness confusion depression anxiety loss of taste or smell  Prior Studies Any changes since last visit?  no  Physicians involved in your care Any changes since last visit?  no   Family History  Problem Relation Age of Onset  . Cervical cancer Mother   . Diabetes Mother   . Stroke Mother   . Heart attack Father   . Stroke Father   . Colon cancer Father   . Anxiety disorder Maternal Aunt   . Depression Maternal Aunt   . Anxiety disorder Maternal Uncle   . Depression Maternal Uncle        suicide attempt by gun shot wound to head. Survived  . Alcohol abuse Other   . Drug abuse Other   . Breast cancer  Maternal Grandmother   . Diabetes Maternal Grandmother   . Breast cancer Paternal Aunt   . Colon polyps Sister        x 2   Social History   Social History  . Marital status: Married    Spouse name: N/A  . Number of children: 0  . Years of education: N/A   Occupational History  . disabled    Social History Main Topics  . Smoking status: Never Smoker  . Smokeless tobacco: Never Used  . Alcohol use No  . Drug use: No  . Sexual activity: Yes   Other Topics Concern  . Not on file   Social History Narrative   Pt has h.s. degree   Past Surgical History:  Procedure Laterality Date  . ABDOMINAL HYSTERECTOMY  1999  . bunions removed  1988  . C4 C5 cage insertion  06/28/2013  . CHOLECYSTECTOMY  04/25/2008  . EDG  12/17/2004  . ELECTROCARDIOGRAM  05/27/2007  . L5 S1 rod insertion  11/07/2012  . SKIN CANCER EXCISION    . SKIN CANCER EXCISION     several  . TONSILLECTOMY  1981  . TUBAL LIGATION  1997   Past Medical History:  Diagnosis Date  . Anemia   . ANXIETY 06/10/2007  . ASYMPTOMATIC POSTMENOPAUSAL STATUS 07/19/2009  . Chronic headaches   . Cough 04/02/2009  . Degenerative arthritis of spine    back and neck  . Depression   .  DYSLIPIDEMIA 06/28/2008  . Esophageal stricture   . Fibromyalgia   . Gallstones   . GERD 06/10/2007  . Hiatal hernia   . 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 HYPERGLYCEMIA 06/28/2008  . HYPERTENSION 06/10/2007  . Irritable bowel syndrome 06/28/2008  . OSTEOARTHRITIS 06/10/2007  . Pneumonia   . Skin cancer    basal cell  . Tubular adenoma of colon   . UPPER RESPIRATORY INFECTION 03/26/2009  . WEIGHT GAIN 01/21/2010   There were no vitals taken for this visit.  Opioid Risk Score:   Fall Risk Score:  `1  Depression screen PHQ 2/9  Depression screen [facility] 2/9 04/28/2017 01/19/2017 03/10/2016 06/10/2015 01/02/2015  Decreased Interest 1 0 0 1 3  Down, Depressed, Hopeless 1 1 1 1 1   PHQ - 2 Score 2 1 1 2 4   Altered sleeping - - - - 3  Tired, decreased energy - - - - 2    Change in appetite - - - - 2  Feeling bad or failure about yourself  - - - - 1  Trouble concentrating - - - - 3  Moving slowly or fidgety/restless - - - - 2  Suicidal thoughts - - - - 1  PHQ-9 Score - - - - 18  Some recent data might be hidden     Review of Systems  Constitutional: Positive for appetite change, chills, fever and unexpected weight change.  HENT: Negative.   Eyes: Negative.   Respiratory: Positive for cough and shortness of breath.   Cardiovascular: Negative.   Gastrointestinal: Positive for abdominal pain and nausea.  Endocrine: Negative.   Genitourinary: Negative.   Musculoskeletal: Positive for joint swelling.  Skin: Negative.   Allergic/Immunologic: Negative.   Neurological: Negative.   Hematological: Negative.   Psychiatric/Behavioral: Negative.   All other systems reviewed and are negative.      Objective:   Physical Exam  Constitutional: She is oriented to person, place, and time. She appears well-developed and well-nourished.  HENT:  Head: Normocephalic and atraumatic.  Neck: Normal range of motion. Neck supple.  Cardiovascular: Normal rate and regular rhythm.   Pulmonary/Chest: Effort normal and breath sounds normal.  Musculoskeletal:  Normal Muscle Bulk and Muscle Testing Reveals:  Upper Extremities: Full ROM and Muscle Strength 5/5 Lumbar Paraspinal Tenderness: L-4-L-5 Lower Extremities: Full ROM and Muscle Strength 5/5 Arises from Table with ease Narrow Based Gait  Neurological: She is alert and oriented to person, place, and time.  Skin: Skin is warm and dry.  Psychiatric: She has a normal mood and affect.  Nursing note and vitals reviewed.         Assessment & Plan:  1. History of chronic lumbar facet disease with spondylosis/DDD/ L4-5 spondylolisthesis.With L4-5 L5-S1 decompression stabilization: 07/06/2017 Refilled: Hydrocodone 10/325mg  one tablet every 6 hours as needed #120.  We will continue the opioid monitoring program,  this consists of regular clinic visits, examinations, urine drug screen, pill counts as well as use of [place] Controlled Substance Reporting System. 2. Depression with anxiety: Dr. Sima Matas Following.  Continue Xanax, and Effexor. 07/06/2017. 3. Cervical spondylosis: Stable at this time with no complaints. Continue Medication Regime. 07/06/2017 4. 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 Continue Ritalin 10 mg one tablet twice a day 0700 and noon #60.07/06/2017 5. Muscle Spasm: Continue Flexeril: May take 1-2 tablets at HS. 07/06/2017  20 minutes of face to face patient care time was spent during this visit. All questions were encouraged and answered.  F/U  in 1 month

## 2017-07-20 DIAGNOSIS — N390 Urinary tract infection, site not specified: Secondary | ICD-10-CM | POA: Diagnosis not present

## 2017-08-02 ENCOUNTER — Encounter: Payer: 59 | Attending: Physical Medicine and Rehabilitation | Admitting: Psychology

## 2017-08-02 DIAGNOSIS — M461 Sacroiliitis, not elsewhere classified: Secondary | ICD-10-CM | POA: Diagnosis present

## 2017-08-02 DIAGNOSIS — M961 Postlaminectomy syndrome, not elsewhere classified: Secondary | ICD-10-CM | POA: Diagnosis not present

## 2017-08-02 DIAGNOSIS — F0781 Postconcussional syndrome: Secondary | ICD-10-CM

## 2017-08-02 DIAGNOSIS — M47817 Spondylosis without myelopathy or radiculopathy, lumbosacral region: Secondary | ICD-10-CM | POA: Diagnosis not present

## 2017-08-02 DIAGNOSIS — F4310 Post-traumatic stress disorder, unspecified: Secondary | ICD-10-CM

## 2017-08-02 DIAGNOSIS — F411 Generalized anxiety disorder: Secondary | ICD-10-CM | POA: Diagnosis not present

## 2017-08-02 NOTE — Progress Notes (Signed)
Patient:                           [name]           DOB:                               25-Feb-1956  MR Number:                  [id number]  Location:                        [facility] [address], [place] [id number] [place]  [id number] Dept: [phone]  Start:  1 PM End:                                 2 PM  Provider/Observer:                           Edgardo Roys PSYD  Chief Complaint:                                   Chief Complaint  Patient presents with  . Post-Traumatic Stress Disorder  . Pain  . Depression  . Memory Loss  . Anxiety    Reason For Service:                         [name] is a 61 year old female referred by Dr. Naaman Plummer due to issues of coping and dealing with a number of stressors. She has a history of postconcussion syndrome, chronic pain syndrome as well as PTSD.  The patient has been dealing with significant anxiety and depression along with a history of PTSD and postconcussion syndrome. The patient was referred for psychotherapeutic interventions primarily because of the issues of depression and coping with family stress.  Interventions Strategy:                    Cognitive/behavioral psychotherapeutic interventions  Participation Level:                           Active  Participation Quality:                       Appropriate and Attentive                            Behavioral Observation:                  Well Groomed, Alert, and Appropriate.   Current Psychosocial Factors:       The patient reports that she has Continued to work on psychosocial connection dealing with some of the impact her PTSD and anxiety/depression of her overall coping strategies. She reports that she is anxiety and depressive symptoms  Content of Session:                          Reviewed current symptoms and work on therapeutic  interventions  around issues associated with depression anxiety and relationship to her long-term posttraumatic stress disorder.  Current Status:                                  The patient reports that she has been doing much better and feeling much less anxiety and depressive type symptoms.  Patient Progress:                              Very good  Impression/Diagnosis:                     [name] is a 61 year old female referred by Dr. Naaman Plummer due to issues of coping and dealing with a number of stressors. She has a history of postconcussion syndrome, chronic pain syndrome as well as PTSD. The patient describes significant difficulty with issues related to her family and the family estate. There were issues when her father passed away where an older sister was in charge of everything and all of his possessions were been ago to this older sister. However, the middle sister began having trouble and when the patient could not help the sister rejected the patient. There've been a number of family conflicts going on including a niece who got in trouble with doing drugs and her excuse to the patient's sister was that the patient was the one who started her doing drugs and giving her money. This is not accurate.   Diagnosis:   Lumbar post-laminectomy syndrome  Lumbosacral spondylosis without myelopathy  PTSD (post-traumatic stress disorder)  GAD (generalized anxiety disorder)  Post concussion syndrome

## 2017-08-03 DIAGNOSIS — H04123 Dry eye syndrome of bilateral lacrimal glands: Secondary | ICD-10-CM | POA: Diagnosis not present

## 2017-08-03 DIAGNOSIS — H53411 Scotoma involving central area, right eye: Secondary | ICD-10-CM | POA: Diagnosis not present

## 2017-08-03 DIAGNOSIS — H35373 Puckering of macula, bilateral: Secondary | ICD-10-CM | POA: Diagnosis not present

## 2017-08-09 ENCOUNTER — Encounter: Payer: Self-pay | Admitting: Registered Nurse

## 2017-08-09 ENCOUNTER — Encounter (HOSPITAL_BASED_OUTPATIENT_CLINIC_OR_DEPARTMENT_OTHER): Payer: 59 | Admitting: Registered Nurse

## 2017-08-09 VITALS — BP 129/80 | HR 92

## 2017-08-09 DIAGNOSIS — Z5181 Encounter for therapeutic drug level monitoring: Secondary | ICD-10-CM | POA: Diagnosis not present

## 2017-08-09 DIAGNOSIS — F0781 Postconcussional syndrome: Secondary | ICD-10-CM

## 2017-08-09 DIAGNOSIS — M961 Postlaminectomy syndrome, not elsewhere classified: Secondary | ICD-10-CM | POA: Diagnosis not present

## 2017-08-09 DIAGNOSIS — M47817 Spondylosis without myelopathy or radiculopathy, lumbosacral region: Secondary | ICD-10-CM | POA: Diagnosis not present

## 2017-08-09 DIAGNOSIS — G894 Chronic pain syndrome: Secondary | ICD-10-CM | POA: Diagnosis not present

## 2017-08-09 DIAGNOSIS — F411 Generalized anxiety disorder: Secondary | ICD-10-CM

## 2017-08-09 DIAGNOSIS — F3289 Other specified depressive episodes: Secondary | ICD-10-CM | POA: Diagnosis not present

## 2017-08-09 DIAGNOSIS — F4310 Post-traumatic stress disorder, unspecified: Secondary | ICD-10-CM

## 2017-08-09 DIAGNOSIS — Z79899 Other long term (current) drug therapy: Secondary | ICD-10-CM

## 2017-08-09 MED ORDER — HYDROCODONE-ACETAMINOPHEN 10-325 MG PO TABS: 1.0000 | ORAL_TABLET | Freq: Four times a day (QID) | ORAL | 0 refills | Status: DC | PRN

## 2017-08-09 MED ORDER — METHYLPHENIDATE HCL 10 MG PO TABS: 10.0000 mg | ORAL_TABLET | Freq: Two times a day (BID) | ORAL | 0 refills | Status: DC

## 2017-08-09 NOTE — Progress Notes (Signed)
Subjective:    Patient ID: [name], female    DOB: 1956/08/02, 61 y.o.   MRN: [id number]  HPI: [name] is a 61year old female who returns for follow up appointmentfor chronic pain and medication refill. She states her pain is located in her lower back and occasionally it radiates into her left lower extremity.She rates her pain 8. Her current exercise regime is walking and performing stretching exercises. [name] equivalent is 40.00 MME. She is also prescribed Alprazolam. We have discussed the black box warning of using opioids and benzodiazepines. I highlighted the dangers of using these drugs together and discussed the adverse events including respiratory suppression, overdose, cognitive impairment and importance of  compliance with current regimen. She verbalizes understanding, we will continue to monitor and adjust as indicated.    [name] last UDS was on 06/13/18it was consistent.   Pain Inventory Average Pain 9 Pain Right Now 8 My pain is sharp, burning, dull, stabbing, tingling and aching  In the last 24 hours, has pain interfered with the following? General activity 10 Relation with others 10 Enjoyment of life 10 What TIME of day is your pain at its worst? . Sleep (in general) Fair  Pain is worse with: walking, bending, sitting, inactivity, standing, unsure and some activites Pain improves with: rest, heat/ice, pacing activities and medication Relief from Meds: 3  Mobility walk without assistance use a cane ability to climb steps?  yes do you drive?  yes  Function disabled: date disabled 2015 I need assistance with the following:  dressing, meal prep, household duties and shopping  Neuro/Psych weakness numbness tremor tingling trouble walking spasms dizziness confusion depression anxiety loss of taste or smell  Prior Studies Any changes since last visit?  no  Physicians involved in your care Any changes since last  visit?  no   Family History  Problem Relation Age of Onset  . Cervical cancer Mother   . Diabetes Mother   . Stroke Mother   . Heart attack Father   . Stroke Father   . Colon cancer Father   . Anxiety disorder Maternal Aunt   . Depression Maternal Aunt   . Anxiety disorder Maternal Uncle   . Depression Maternal Uncle        suicide attempt by gun shot wound to head. Survived  . Alcohol abuse Other   . Drug abuse Other   . Breast cancer Maternal Grandmother   . Diabetes Maternal Grandmother   . Breast cancer Paternal Aunt   . Colon polyps Sister        x 2   Social History   Social History  . Marital status: Married    Spouse name: N/A  . Number of children: 0  . Years of education: N/A   Occupational History  . disabled    Social History Main Topics  . Smoking status: Never Smoker  . Smokeless tobacco: Never Used  . Alcohol use No  . Drug use: No  . Sexual activity: Yes   Other Topics Concern  . Not on file   Social History Narrative   Pt has h.s. degree   Past Surgical History:  Procedure Laterality Date  . ABDOMINAL HYSTERECTOMY  1999  . bunions removed  1988  . C4 C5 cage insertion  06/28/2013  . CHOLECYSTECTOMY  04/25/2008  . EDG  12/17/2004  . ELECTROCARDIOGRAM  05/27/2007  . L5 S1 rod insertion  11/07/2012  . SKIN CANCER EXCISION    .  SKIN CANCER EXCISION     several  . TONSILLECTOMY  1981  . TUBAL LIGATION  1997   Past Medical History:  Diagnosis Date  . Anemia   . ANXIETY 06/10/2007  . ASYMPTOMATIC POSTMENOPAUSAL STATUS 07/19/2009  . Chronic headaches   . Cough 04/02/2009  . Degenerative arthritis of spine    back and neck  . Depression   . DYSLIPIDEMIA 06/28/2008  . Esophageal stricture   . Fibromyalgia   . Gallstones   . GERD 06/10/2007  . Hiatal hernia   . 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 HYPERGLYCEMIA 06/28/2008  . HYPERTENSION 06/10/2007  . Irritable bowel syndrome 06/28/2008  . OSTEOARTHRITIS 06/10/2007  . Pneumonia   . Skin cancer    basal cell    . Tubular adenoma of colon   . UPPER RESPIRATORY INFECTION 03/26/2009  . WEIGHT GAIN 01/21/2010   There were no vitals taken for this visit.  Opioid Risk Score:  1 Fall Risk Score:  `1  Depression screen PHQ 2/9  Depression screen [facility] 2/9 08/09/2017 04/28/2017 01/19/2017 03/10/2016 06/10/2015 01/02/2015  Decreased Interest 1 1 0 0 1 3  Down, Depressed, Hopeless 1 1 1 1 1 1   PHQ - 2 Score 2 2 1 1 2 4   Altered sleeping - - - - - 3  Tired, decreased energy - - - - - 2  Change in appetite - - - - - 2  Feeling bad or failure about yourself  - - - - - 1  Trouble concentrating - - - - - 3  Moving slowly or fidgety/restless - - - - - 2  Suicidal thoughts - - - - - 1  PHQ-9 Score - - - - - 18  Some recent data might be hidden     Review of Systems  Constitutional: Positive for appetite change, chills, fever and unexpected weight change.  HENT: Negative.   Eyes: Negative.   Respiratory: Positive for cough and shortness of breath.   Cardiovascular: Negative.   Gastrointestinal: Positive for abdominal pain and nausea.  Endocrine: Negative.   Genitourinary: Negative.   Musculoskeletal: Positive for joint swelling.  Skin: Negative.   Allergic/Immunologic: Negative.   Neurological: Negative.   Hematological: Negative.   Psychiatric/Behavioral: Negative.   All other systems reviewed and are negative.      Objective:   Physical Exam  Constitutional: She is oriented to person, place, and time. She appears well-developed and well-nourished.  HENT:  Head: Normocephalic and atraumatic.  Neck: Normal range of motion. Neck supple.  Cardiovascular: Normal rate and regular rhythm.   Pulmonary/Chest: Effort normal and breath sounds normal.  Musculoskeletal:  Normal Muscle Bulk and Muscle Testing Reveals:  Upper Extremities: Full ROM and Muscle Strength 5/5 Lumbar Paraspinal Tenderness: L-4-L-5 Lower Extremities: Full ROM and Muscle Strength 5/5 Arises from Table with ease Narrow Based  Gait  Neurological: She is alert and oriented to person, place, and time.  Skin: Skin is warm and dry.  Psychiatric: She has a normal mood and affect.  Nursing note and vitals reviewed.         Assessment & Plan:  1. History of chronic lumbar facet disease with spondylosis/DDD/ L4-5 spondylolisthesis.With L4-5 L5-S1 decompression stabilization: 08/09/2017 Refilled: Hydrocodone 10/325mg  one tablet every 6 hours as needed #120.  We will continue the opioid monitoring program, this consists of regular clinic visits, examinations, urine drug screen, pill counts as well as use of [place] Controlled Substance Reporting System. 2. Depression with anxiety: Dr. Sima Matas Following.  Continue Xanax, and Effexor. 08/09/2017. 3. Cervical spondylosis: Stable at this time with no complaints. Continue Medication Regime. 08/09/2017 4. 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 Continue Ritalin 10 mg one tablet twice a day 0700 and noon #60.08/09/2017 5. Muscle Spasm: Continue Flexeril: May take 1-2 tablets at HS. 08/09/2017   F/U in 1 month

## 2017-08-10 ENCOUNTER — Encounter: Payer: Self-pay | Admitting: Endocrinology

## 2017-08-10 ENCOUNTER — Ambulatory Visit (INDEPENDENT_AMBULATORY_CARE_PROVIDER_SITE_OTHER): Payer: 59 | Admitting: Endocrinology

## 2017-08-10 VITALS — BP 136/80 | HR 101 | Wt 164.0 lb

## 2017-08-10 DIAGNOSIS — I10 Essential (primary) hypertension: Secondary | ICD-10-CM | POA: Diagnosis not present

## 2017-08-10 DIAGNOSIS — D509 Iron deficiency anemia, unspecified: Secondary | ICD-10-CM

## 2017-08-10 DIAGNOSIS — Z0000 Encounter for general adult medical examination without abnormal findings: Secondary | ICD-10-CM

## 2017-08-10 DIAGNOSIS — M810 Age-related osteoporosis without current pathological fracture: Secondary | ICD-10-CM | POA: Diagnosis not present

## 2017-08-10 DIAGNOSIS — E039 Hypothyroidism, unspecified: Secondary | ICD-10-CM

## 2017-08-10 DIAGNOSIS — Z23 Encounter for immunization: Secondary | ICD-10-CM

## 2017-08-10 DIAGNOSIS — R739 Hyperglycemia, unspecified: Secondary | ICD-10-CM | POA: Diagnosis not present

## 2017-08-10 LAB — CBC WITH DIFFERENTIAL/PLATELET
BASOS PCT: 0.8 % (ref 0.0–3.0)
Basophils Absolute: 0 10*3/uL (ref 0.0–0.1)
EOS ABS: 0 10*3/uL (ref 0.0–0.7)
EOS PCT: 0.7 % (ref 0.0–5.0)
HCT: 39.7 % (ref 36.0–46.0)
Hemoglobin: 13.2 g/dL (ref 12.0–15.0)
LYMPHS ABS: 1.7 10*3/uL (ref 0.7–4.0)
Lymphocytes Relative: 38.9 % (ref 12.0–46.0)
MCHC: 33.2 g/dL (ref 30.0–36.0)
MCV: 87 fl (ref 78.0–100.0)
MONO ABS: 0.2 10*3/uL (ref 0.1–1.0)
Monocytes Relative: 5 % (ref 3.0–12.0)
NEUTROS ABS: 2.4 10*3/uL (ref 1.4–7.7)
NEUTROS PCT: 54.6 % (ref 43.0–77.0)
Platelets: 205 10*3/uL (ref 150.0–400.0)
RBC: 4.56 Mil/uL (ref 3.87–5.11)
RDW: 13.8 % (ref 11.5–15.5)
WBC: 4.3 10*3/uL (ref 4.0–10.5)

## 2017-08-10 LAB — BASIC METABOLIC PANEL
BUN: 10 mg/dL (ref 6–23)
CHLORIDE: 101 meq/L (ref 96–112)
CO2: 30 meq/L (ref 19–32)
CREATININE: 0.65 mg/dL (ref 0.40–1.20)
Calcium: 9.3 mg/dL (ref 8.4–10.5)
GFR: 98.72 mL/min (ref >60.00)
Glucose, Bld: 101 mg/dL — ABNORMAL HIGH (ref 70–99)
Potassium: 3.5 mEq/L (ref 3.5–5.1)
Sodium: 139 mEq/L (ref 135–145)

## 2017-08-10 LAB — HEPATIC FUNCTION PANEL
ALK PHOS: 64 U/L (ref 39–117)
ALT: 21 U/L (ref 0–35)
AST: 25 U/L (ref 0–37)
Albumin: 4.6 g/dL (ref 3.5–5.2)
BILIRUBIN TOTAL: 0.5 mg/dL (ref 0.2–1.2)
Bilirubin, Direct: 0.1 mg/dL (ref 0.0–0.3)
Total Protein: 6.7 g/dL (ref 6.0–8.3)

## 2017-08-10 LAB — LIPID PANEL
CHOLESTEROL: 182 mg/dL (ref 0–200)
HDL: 45.7 mg/dL (ref >39.00)
NonHDL: 135.84
TRIGLYCERIDES: 354 mg/dL — AB (ref 0.0–149.0)
Total CHOL/HDL Ratio: 4
VLDL: 70.8 mg/dL — ABNORMAL HIGH (ref 0.0–40.0)

## 2017-08-10 LAB — IBC PANEL
IRON: 29 ug/dL — AB (ref 42–145)
Saturation Ratios: 9.6 % — ABNORMAL LOW (ref 20.0–50.0)
Transferrin: 216 mg/dL (ref 212.0–360.0)

## 2017-08-10 LAB — TSH: TSH: 4.18 u[IU]/mL (ref 0.35–4.50)

## 2017-08-10 LAB — HEMOGLOBIN A1C: Hgb A1c MFr Bld: 5.2 % (ref 4.6–6.5)

## 2017-08-10 LAB — VITAMIN D 25 HYDROXY (VIT D DEFICIENCY, FRACTURES): VITD: 27.12 ng/mL — ABNORMAL LOW (ref 30.00–100.00)

## 2017-08-10 NOTE — Patient Instructions (Addendum)
Please consider these measures for your health:  minimize alcohol.  Do not use tobacco products.  Have a colonoscopy at least every 10 years from age 60.  Women should have an annual mammogram from age 35.  Keep firearms safely stored.  Always use seat belts.  have working smoke alarms in your home.  See an eye doctor and dentist regularly.  Never drive under the influence of alcohol or drugs (including prescription drugs).  Those with fair skin should take precautions against the sun, and should carefully examine their skin once per month, for any new or changed moles. please let me know what your wishes would be, if artificial life support measures should become necessary.   blood tests are requested for you today.  We'll let you know about the results.  Please come back for a follow-up appointment in 1 year.

## 2017-08-10 NOTE — Progress Notes (Signed)
we discussed code status.  pt requests full code, but would not want to be started or maintained on artificial life-support measures if there was not a reasonable chance of recovery 

## 2017-08-11 ENCOUNTER — Other Ambulatory Visit: Payer: Self-pay | Admitting: Endocrinology

## 2017-08-11 ENCOUNTER — Encounter: Payer: Self-pay | Admitting: Endocrinology

## 2017-08-11 DIAGNOSIS — Z1231 Encounter for screening mammogram for malignant neoplasm of breast: Secondary | ICD-10-CM

## 2017-08-11 LAB — URINALYSIS, ROUTINE W REFLEX MICROSCOPIC
BILIRUBIN URINE: NEGATIVE
KETONES UR: NEGATIVE
LEUKOCYTES UA: NEGATIVE
NITRITE: NEGATIVE
Specific Gravity, Urine: 1.025 (ref 1.000–1.030)
Total Protein, Urine: NEGATIVE
Urine Glucose: NEGATIVE
Urobilinogen, UA: 0.2 (ref 0.0–1.0)
WBC, UA: NONE SEEN (ref 0–?)
pH: 6 (ref 5.0–8.0)

## 2017-08-11 LAB — LDL CHOLESTEROL, DIRECT: Direct LDL: 82 mg/dL

## 2017-08-12 LAB — PTH, INTACT AND CALCIUM
Calcium: 9.4 mg/dL (ref 8.6–10.4)
PTH: 99 pg/mL — AB (ref 14–64)

## 2017-08-12 NOTE — Progress Notes (Signed)
Subjective:    Patient ID: [name], female    DOB: Apr 27, 1956, 61 y.o.   MRN: [id number]  HPI Pt is here for regular wellness examination, and is feeling pretty well in general, and says chronic med probs are stable, except as noted below Past Medical History:  Diagnosis Date  . Anemia   . ANXIETY 06/10/2007  . ASYMPTOMATIC POSTMENOPAUSAL STATUS 07/19/2009  . Chronic headaches   . Cough 04/02/2009  . Degenerative arthritis of spine    back and neck  . Depression   . DYSLIPIDEMIA 06/28/2008  . Esophageal stricture   . Fibromyalgia   . Gallstones   . GERD 06/10/2007  . Hiatal hernia   . 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 HYPERGLYCEMIA 06/28/2008  . HYPERTENSION 06/10/2007  . Irritable bowel syndrome 06/28/2008  . OSTEOARTHRITIS 06/10/2007  . Pneumonia   . Skin cancer    basal cell  . Tubular adenoma of colon   . UPPER RESPIRATORY INFECTION 03/26/2009  . WEIGHT GAIN 01/21/2010    Past Surgical History:  Procedure Laterality Date  . ABDOMINAL HYSTERECTOMY  1999  . bunions removed  1988  . C4 C5 cage insertion  06/28/2013  . CHOLECYSTECTOMY  04/25/2008  . EDG  12/17/2004  . ELECTROCARDIOGRAM  05/27/2007  . L5 S1 rod insertion  11/07/2012  . SKIN CANCER EXCISION    . SKIN CANCER EXCISION     several  . TONSILLECTOMY  1981  . TUBAL LIGATION  1997    Social History   Social History  . Marital status: Married    Spouse name: N/A  . Number of children: 0  . Years of education: N/A   Occupational History  . disabled    Social History Main Topics  . Smoking status: Never Smoker  . Smokeless tobacco: Never Used  . Alcohol use No  . Drug use: No  . Sexual activity: Yes   Other Topics Concern  . Not on file   Social History Narrative   Pt has h.s. degree    Current Outpatient Prescriptions on File Prior to Visit  Medication Sig Dispense Refill  . acyclovir (ZOVIRAX) 800 MG tablet Take 1 tablet (800 mg total) by mouth daily. 90 tablet 3  . ALPRAZolam (XANAX) 0.5 MG tablet Take 1  tablet (0.5 mg total) by mouth 3 (three) times daily as needed for anxiety. 90 tablet 2  . APPLE CIDER VINEGAR PO Take by mouth 2 (two) times daily.    Marland Kitchen aspirin 325 MG tablet Take 325 mg by mouth daily.    Marland Kitchen atorvastatin (LIPITOR) 40 MG tablet Take 1 tablet (40 mg total) by mouth daily. 90 tablet 2  . Cranberry 12600 MG CAPS Take by mouth.    . cyclobenzaprine (FLEXERIL) 5 MG tablet TAKE 1 TABLET AT BEDTIME AS NEEDED FOR MUSCLE SPASM MAY TAKE AN EXTRA TABLET AS NEEDED 45 tablet 3  . ferrous sulfate 325 (65 FE) MG tablet Take 325 mg by mouth daily.     Marland Kitchen HYDROcodone-acetaminophen (NORCO) 10-325 MG tablet Take 1 tablet by mouth every 6 (six) hours as needed. 120 tablet 0  . losartan-hydrochlorothiazide (HYZAAR) 50-12.5 MG tablet Take 0.5 tablets by mouth daily. 45 tablet 3  . meclizine (ANTIVERT) 12.5 MG tablet TAKE 1 TABLET (12.5 MG TOTAL) BY MOUTH 3 (THREE) TIMES DAILY AS NEEDED. 45 tablet 2  . methylphenidate (RITALIN) 10 MG tablet Take 1 tablet (10 mg total) by mouth 2 (two) times daily with breakfast and lunch. 8546,2703 60 tablet  0  . pantoprazole (PROTONIX) 40 MG tablet TAKE 1 TABLET TWICE A DAY (DISCONTINUE OMEPRAZOLE) 180 tablet 0  . PREMARIN vaginal cream See admin instructions.  1  . ranitidine (ZANTAC) 150 MG capsule Take 1 capsule (150 mg total) by mouth every evening. 30 capsule 6  . venlafaxine XR (EFFEXOR-XR) 150 MG 24 hr capsule TAKE 1 CAPSULE DAILY WITH BREAKFAST 90 capsule 3  . vitamin B-12 (CYANOCOBALAMIN) 1000 MCG tablet Take 1,000 mcg by mouth daily.     No current facility-administered medications on file prior to visit.     Allergies  Allergen Reactions  . Gabapentin Other (See Comments)    Bad headaches  . Poison Ivy Extract [Poison Ivy Extract] Rash  . Poison Oak Extract [Poison Oak Extract] Rash  . Poison Sumac Extract Rash    Family History  Problem Relation Age of Onset  . Cervical cancer Mother   . Diabetes Mother   . Stroke Mother   . Heart attack  Father   . Stroke Father   . Colon cancer Father   . Anxiety disorder Maternal Aunt   . Depression Maternal Aunt   . Anxiety disorder Maternal Uncle   . Depression Maternal Uncle        suicide attempt by gun shot wound to head. Survived  . Alcohol abuse Other   . Drug abuse Other   . Breast cancer Maternal Grandmother   . Diabetes Maternal Grandmother   . Breast cancer Paternal Aunt   . Colon polyps Sister        x 2    BP 136/80   Pulse (!) 101   Wt 164 lb (74.4 kg)   SpO2 98%   BMI 27.29 kg/m     Review of Systems Denies fever, fatigue, visual loss, hearing loss, chest pain, sob, cold intolerance, BRBPR, syncope, numbness,  and rash.  She has fatigue, rhinorrhea, hematuria (chronic), and easy bruising.  Depression persists (no SI--sees psych).  No change in chronic low-back pain    Objective:   Physical Exam VS: see vs page GEN: no distress HEAD: head: no deformity eyes: no periorbital swelling, no proptosis external nose and ears are normal mouth: no lesion seen NECK: supple, thyroid is not enlarged CHEST WALL: no deformity LUNGS: clear to auscultation CV: reg rate and rhythm, no murmur ABD: abdomen is soft, nontender.  no hepatosplenomegaly.  not distended.  no hernia MUSCULOSKELETAL: muscle bulk and strength are grossly normal.  no obvious joint swelling.  gait is normal and steady EXTEMITIES: no deformity.  no ulcer on the feet.  feet are of normal color and temp.  no edema PULSES: dorsalis pedis intact bilat.  no carotid bruit NEURO:  cn 2-12 grossly intact.   readily moves all 4's.  sensation is intact to touch on the feet SKIN:  Normal texture and temperature.  No rash or suspicious lesion is visible.   NODES:  None palpable at the neck PSYCH: alert, well-oriented.  Does not appear anxious nor depressed.        Assessment & Plan:  Wellness visit today, with problems stable, except as noted.  Please consider these measures for your health:  minimize  alcohol.  Do not use tobacco products.  Have a colonoscopy at least every 10 years from age 97.  Women should have an annual mammogram from age 42.  Keep firearms safely stored.  Always use seat belts.  have working smoke alarms in your home.  See an eye doctor and  dentist regularly.  Never drive under the influence of alcohol or drugs (including prescription drugs).  Those with fair skin should take precautions against the sun, and should carefully examine their skin once per month, for any new or changed moles. please let me know what your wishes would be, if artificial life support measures should become necessary.   blood tests are requested for you today.  We'll let you know about the results.  Please come back for a follow-up appointment in 1 yea

## 2017-08-18 ENCOUNTER — Other Ambulatory Visit: Payer: Self-pay | Admitting: Registered Nurse

## 2017-09-08 ENCOUNTER — Encounter: Payer: Self-pay | Admitting: Physical Medicine & Rehabilitation

## 2017-09-08 ENCOUNTER — Encounter: Payer: 59 | Attending: Physical Medicine and Rehabilitation | Admitting: Physical Medicine & Rehabilitation

## 2017-09-08 VITALS — BP 139/86 | HR 99

## 2017-09-08 DIAGNOSIS — F4310 Post-traumatic stress disorder, unspecified: Secondary | ICD-10-CM

## 2017-09-08 DIAGNOSIS — M47817 Spondylosis without myelopathy or radiculopathy, lumbosacral region: Secondary | ICD-10-CM | POA: Diagnosis not present

## 2017-09-08 DIAGNOSIS — F0781 Postconcussional syndrome: Secondary | ICD-10-CM

## 2017-09-08 DIAGNOSIS — M461 Sacroiliitis, not elsewhere classified: Secondary | ICD-10-CM | POA: Diagnosis not present

## 2017-09-08 DIAGNOSIS — F411 Generalized anxiety disorder: Secondary | ICD-10-CM | POA: Insufficient documentation

## 2017-09-08 DIAGNOSIS — M961 Postlaminectomy syndrome, not elsewhere classified: Secondary | ICD-10-CM | POA: Diagnosis not present

## 2017-09-08 MED ORDER — HYDROCODONE-ACETAMINOPHEN 10-325 MG PO TABS: 1.0000 | ORAL_TABLET | Freq: Four times a day (QID) | ORAL | 0 refills | Status: DC | PRN

## 2017-09-08 MED ORDER — METHYLPHENIDATE HCL 10 MG PO TABS: 10.0000 mg | ORAL_TABLET | Freq: Two times a day (BID) | ORAL | 0 refills | Status: DC

## 2017-09-08 NOTE — Progress Notes (Signed)
Subjective:    Patient ID: [name], female    DOB: June 14, 1956, 61 y.o.   MRN: [id number]  HPI  Garima is here in follow up of her chronic pain and PTSD. She has been doing better with handling her stress since seeing Dr. Sima Matas. She feels that the sessions have been quite helpful. Her mom's cognition continues to decline and that can be quiet stressful for her. She tries to use strategies to decompress and cope the best that she can. She realized that when she handled her stress better her nausea and GERD also improvd as well.   She continues to stay active as she can. She is moving her bowels regularly.   For pain she is taking hydrocodone 4 x daily. She uses ritalin for attention and concentration which is still effective.    Pain Inventory Average Pain 8 Pain Right Now 8 My pain is constant, sharp, burning, dull, stabbing, tingling and aching  In the last 24 hours, has pain interfered with the following? General activity 10 Relation with others 10 Enjoyment of life 10 What TIME of day is your pain at its worst? morning Sleep (in general) Fair  Pain is worse with: walking, bending, sitting, inactivity, standing, unsure and some activites Pain improves with: rest, heat/ice, pacing activities, medication and injections Relief from Meds: 5  Mobility use a cane ability to climb steps?  yes do you drive?  yes  Function disabled: date disabled 2015  Neuro/Psych bladder control problems numbness tremor tingling trouble walking spasms dizziness confusion depression anxiety loss of taste or smell  Prior Studies Any changes since last visit?  no  Physicians involved in your care Any changes since last visit?  no   Family History  Problem Relation Age of Onset  . Cervical cancer Mother   . Diabetes Mother   . Stroke Mother   . Heart attack Father   . Stroke Father   . Colon cancer Father   . Anxiety disorder Maternal Aunt   . Depression Maternal Aunt    . Anxiety disorder Maternal Uncle   . Depression Maternal Uncle        suicide attempt by gun shot wound to head. Survived  . Alcohol abuse Other   . Drug abuse Other   . Breast cancer Maternal Grandmother   . Diabetes Maternal Grandmother   . Breast cancer Paternal Aunt   . Colon polyps Sister        x 2   Social History   Socioeconomic History  . Marital status: Married    Spouse name: Not on file  . Number of children: 0  . Years of education: Not on file  . Highest education level: Not on file  Social Needs  . Financial resource strain: Not on file  . Food insecurity - worry: Not on file  . Food insecurity - inability: Not on file  . Transportation needs - medical: Not on file  . Transportation needs - non-medical: Not on file  Occupational History  . Occupation: disabled  Tobacco Use  . Smoking status: Never Smoker  . Smokeless tobacco: Never Used  Substance and Sexual Activity  . Alcohol use: No    Alcohol/week: 0.0 oz  . Drug use: No  . Sexual activity: Yes  Other Topics Concern  . Not on file  Social History Narrative   Pt has h.s. degree   Past Surgical History:  Procedure Laterality Date  . ABDOMINAL HYSTERECTOMY  1999  .  bunions removed  1988  . C4 C5 cage insertion  06/28/2013  . CHOLECYSTECTOMY  04/25/2008  . EDG  12/17/2004  . ELECTROCARDIOGRAM  05/27/2007  . L5 S1 rod insertion  11/07/2012  . SKIN CANCER EXCISION    . SKIN CANCER EXCISION     several  . TONSILLECTOMY  1981  . TUBAL LIGATION  1997   Past Medical History:  Diagnosis Date  . Anemia   . ANXIETY 06/10/2007  . ASYMPTOMATIC POSTMENOPAUSAL STATUS 07/19/2009  . Chronic headaches   . Cough 04/02/2009  . Degenerative arthritis of spine    back and neck  . Depression   . DYSLIPIDEMIA 06/28/2008  . Esophageal stricture   . Fibromyalgia   . Gallstones   . GERD 06/10/2007  . Hiatal hernia   . 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 HYPERGLYCEMIA 06/28/2008  . HYPERTENSION 06/10/2007  . Irritable bowel  syndrome 06/28/2008  . OSTEOARTHRITIS 06/10/2007  . Pneumonia   . Skin cancer    basal cell  . Tubular adenoma of colon   . UPPER RESPIRATORY INFECTION 03/26/2009  . WEIGHT GAIN 01/21/2010   There were no vitals taken for this visit.  Opioid Risk Score:   Fall Risk Score:  `1  Depression screen PHQ 2/9  Depression screen [facility] 2/9 08/09/2017 04/28/2017 01/19/2017 03/10/2016 06/10/2015 01/02/2015  Decreased Interest 1 1 0 0 1 3  Down, Depressed, Hopeless 1 1 1 1 1 1   PHQ - 2 Score 2 2 1 1 2 4   Altered sleeping - - - - - 3  Tired, decreased energy - - - - - 2  Change in appetite - - - - - 2  Feeling bad or failure about yourself  - - - - - 1  Trouble concentrating - - - - - 3  Moving slowly or fidgety/restless - - - - - 2  Suicidal thoughts - - - - - 1  PHQ-9 Score - - - - - 18  Some recent data might be hidden     Review of Systems  Constitutional: Positive for appetite change, chills, fever and unexpected weight change.  HENT: Negative.   Eyes: Negative.   Respiratory: Positive for cough and shortness of breath.   Cardiovascular: Negative.   Gastrointestinal: Positive for abdominal pain and nausea.  Endocrine: Negative.   Genitourinary: Negative.   Musculoskeletal: Positive for joint swelling.  Skin: Negative.   Allergic/Immunologic: Negative.   Neurological: Negative.   Hematological: Bruises/bleeds easily.  Psychiatric/Behavioral: Negative.   All other systems reviewed and are negative.      Objective:   Physical Exam  Constitutional: She is oriented to person, place, and time. She appears well-developed and well-nourished.  HENT:  Head: Normocephalic and atraumatic.  Eyes: Conjunctivae and EOM are normal. Pupils are equal, round, and reactive to light.  Neck: supple Cardiovascular: RRR Pulmonary/Chest: CTA B  Abdominal: Soft. Bowel sounds are normal.  Musculoskeletal: mild finger discomfort  Bilateral PSIS tender as are lower lumbar paraspinals. Balance intact  with cane.  Neurological: memory and attention are functional today. Strength symmetrical. Sensory is normal. DTR's 1+.  Skin: Skin is warm.  Psychiatric: affect bright.     Assessment & Plan:  ASSESSMENT:  1. 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 s/p L4-5 L5-S1 decompression stabilization with substantial mprovement of her back and right leg pain.  2. Left knee meniscus injury.  3. Depression with anxiety. Much mproved with effexor and spiritual/social supports in place  4. Cervical  spondylosis/Myofascial pain.- see below  5. Concussion on 05/13/14 with post concussion syndrome- has ongoing memory and concentration deficits.  6. 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 OA right knee/meniscus---good results with June injection 8. Left 2nd MCP injury from late June---mild tendonitis---responsive to injections 9. Hematuria: ?source    PLAN:  1. Maintain ritalin 10mg  for attention/arousal---refilled #60 2. Left index finger stable to improved  3. Hydrocodone will continue for breakthrough pain 10/325 q6 prn #120. We will continue the opioid monitoring program, this consists of regular clinic visits, examinations, routine drug screening, pill counts as well as use of [place] Controlled Substance Reporting System. [place] today.   4. Follow up with Dr. Sima Matas next week to address mood/coping skills. This is proving quite helpful for managing her mood. Reviewed strategies for stress mgt also during visit. We also discussed the fact that given her mother's declining health, she eventually will no longer be able to provide the care she needs.  5. Elavil prn at bedtime.  6 .Follow up in about 38months with NP. 12minutes of face to face patient care time were spent during this visit. All questions were encouraged and answered.

## 2017-09-08 NOTE — Patient Instructions (Signed)
PLEASE FEEL FREE TO CALL OUR OFFICE WITH ANY PROBLEMS OR QUESTIONS ([phone])      

## 2017-09-09 DIAGNOSIS — Z01419 Encounter for gynecological examination (general) (routine) without abnormal findings: Secondary | ICD-10-CM | POA: Diagnosis not present

## 2017-09-21 ENCOUNTER — Ambulatory Visit: Payer: 59

## 2017-09-26 ENCOUNTER — Other Ambulatory Visit: Payer: Self-pay | Admitting: Endocrinology

## 2017-10-08 ENCOUNTER — Other Ambulatory Visit: Payer: Self-pay

## 2017-10-08 ENCOUNTER — Encounter: Payer: 59 | Attending: Physical Medicine and Rehabilitation | Admitting: Registered Nurse

## 2017-10-08 ENCOUNTER — Encounter: Payer: Self-pay | Admitting: Registered Nurse

## 2017-10-08 VITALS — BP 151/86 | HR 100 | Temp 98.7°F

## 2017-10-08 DIAGNOSIS — F411 Generalized anxiety disorder: Secondary | ICD-10-CM | POA: Insufficient documentation

## 2017-10-08 DIAGNOSIS — G894 Chronic pain syndrome: Secondary | ICD-10-CM | POA: Diagnosis not present

## 2017-10-08 DIAGNOSIS — M461 Sacroiliitis, not elsewhere classified: Secondary | ICD-10-CM | POA: Insufficient documentation

## 2017-10-08 DIAGNOSIS — F0781 Postconcussional syndrome: Secondary | ICD-10-CM

## 2017-10-08 DIAGNOSIS — F4310 Post-traumatic stress disorder, unspecified: Secondary | ICD-10-CM

## 2017-10-08 DIAGNOSIS — Z79899 Other long term (current) drug therapy: Secondary | ICD-10-CM

## 2017-10-08 DIAGNOSIS — M47817 Spondylosis without myelopathy or radiculopathy, lumbosacral region: Secondary | ICD-10-CM | POA: Diagnosis not present

## 2017-10-08 DIAGNOSIS — M961 Postlaminectomy syndrome, not elsewhere classified: Secondary | ICD-10-CM | POA: Diagnosis not present

## 2017-10-08 DIAGNOSIS — Z5181 Encounter for therapeutic drug level monitoring: Secondary | ICD-10-CM | POA: Diagnosis not present

## 2017-10-08 DIAGNOSIS — F3289 Other specified depressive episodes: Secondary | ICD-10-CM | POA: Diagnosis not present

## 2017-10-08 MED ORDER — ALPRAZOLAM 0.5 MG PO TABS: 0.5000 mg | ORAL_TABLET | Freq: Three times a day (TID) | ORAL | 2 refills | Status: DC | PRN

## 2017-10-08 MED ORDER — HYDROCODONE-ACETAMINOPHEN 10-325 MG PO TABS: 1.0000 | ORAL_TABLET | Freq: Four times a day (QID) | ORAL | 0 refills | Status: DC | PRN

## 2017-10-08 MED ORDER — METHYLPHENIDATE HCL 10 MG PO TABS: 10.0000 mg | ORAL_TABLET | Freq: Two times a day (BID) | ORAL | 0 refills | Status: DC

## 2017-10-08 NOTE — Progress Notes (Signed)
Subjective:    Patient ID: [name], female    DOB: 12/11/1955, 61 y.o.   MRN: [id number]  HPI: [name] is a 61year old female who returns for follow up appointmentfor chronic pain and medication refill. She states her pain is located in her lower back. She rates her pain 9. Her current exercise regime is walking and performing stretching exercises.  [name] arrived with cold symptoms, a febrile onset since 10/05/2017, she states she will f/u with her PCP.   [name] equivalent is 40.00 MME. She is also prescribed Alprazolam. We have reviewed the black box warning of using opioids and benzodiazepines. I highlighted the dangers of using these drugs together and discussed the adverse events including respiratory suppression, overdose, cognitive impairment and importance of  compliance with current regimen. She verbalizes understanding, we will continue to monitor and adjust as indicated.    [name] last UDS was on 06/13/18it was consistent.   Pain Inventory Average Pain 9 Pain Right Now 9 My pain is sharp, burning, dull, stabbing, tingling and aching  In the last 24 hours, has pain interfered with the following? General activity 10 Relation with others 10 Enjoyment of life 10 What TIME of day is your pain at its worst? . Sleep (in general) Poor  Pain is worse with: some activites Pain improves with: rest, heat/ice, pacing activities and medication Relief from Meds: 9  Mobility walk without assistance use a cane ability to climb steps?  yes do you drive?  yes  Function disabled: date disabled 2015 I need assistance with the following:  dressing, meal prep, household duties and shopping  Neuro/Psych weakness numbness tremor tingling trouble walking spasms dizziness confusion depression anxiety loss of taste or smell  Prior Studies Any changes since last visit?  no  Physicians involved in your care Any changes since last  visit?  no   Family History  Problem Relation Age of Onset  . Cervical cancer Mother   . Diabetes Mother   . Stroke Mother   . Heart attack Father   . Stroke Father   . Colon cancer Father   . Anxiety disorder Maternal Aunt   . Depression Maternal Aunt   . Anxiety disorder Maternal Uncle   . Depression Maternal Uncle        suicide attempt by gun shot wound to head. Survived  . Alcohol abuse Other   . Drug abuse Other   . Breast cancer Maternal Grandmother   . Diabetes Maternal Grandmother   . Breast cancer Paternal Aunt   . Colon polyps Sister        x 2   Social History   Socioeconomic History  . Marital status: Married    Spouse name: None  . Number of children: 0  . Years of education: None  . Highest education level: None  Social Needs  . Financial resource strain: None  . Food insecurity - worry: None  . Food insecurity - inability: None  . Transportation needs - medical: None  . Transportation needs - non-medical: None  Occupational History  . Occupation: disabled  Tobacco Use  . Smoking status: Never Smoker  . Smokeless tobacco: Never Used  Substance and Sexual Activity  . Alcohol use: No    Alcohol/week: 0.0 oz  . Drug use: No  . Sexual activity: Yes  Other Topics Concern  . None  Social History Narrative   Pt has h.s. degree   Past Surgical History:  Procedure Laterality Date  . ABDOMINAL HYSTERECTOMY  1999  . bunions removed  1988  . C4 C5 cage insertion  06/28/2013  . CHOLECYSTECTOMY  04/25/2008  . EDG  12/17/2004  . ELECTROCARDIOGRAM  05/27/2007  . L5 S1 rod insertion  11/07/2012  . SKIN CANCER EXCISION    . SKIN CANCER EXCISION     several  . TONSILLECTOMY  1981  . TUBAL LIGATION  1997   Past Medical History:  Diagnosis Date  . Anemia   . ANXIETY 06/10/2007  . ASYMPTOMATIC POSTMENOPAUSAL STATUS 07/19/2009  . Chronic headaches   . Cough 04/02/2009  . Degenerative arthritis of spine    back and neck  . Depression   . DYSLIPIDEMIA  06/28/2008  . Esophageal stricture   . Fibromyalgia   . Gallstones   . GERD 06/10/2007  . Hiatal hernia   . 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 HYPERGLYCEMIA 06/28/2008  . HYPERTENSION 06/10/2007  . Irritable bowel syndrome 06/28/2008  . OSTEOARTHRITIS 06/10/2007  . Pneumonia   . Skin cancer    basal cell  . Tubular adenoma of colon   . UPPER RESPIRATORY INFECTION 03/26/2009  . WEIGHT GAIN 01/21/2010   BP (!) 151/86   Pulse 100   SpO2 98%   Opioid Risk Score:  1 Fall Risk Score:  `1  Depression screen PHQ 2/9  Depression screen [facility] 2/9 10/08/2017 08/09/2017 04/28/2017 01/19/2017 03/10/2016 06/10/2015 01/02/2015  Decreased Interest - 1 1 0 0 1 3  Down, Depressed, Hopeless - 1 1 1 1 1 1   PHQ - 2 Score - 2 2 1 1 2 4   Altered sleeping 1 - - - - - 3  Tired, decreased energy 1 - - - - - 2  Change in appetite - - - - - - 2  Feeling bad or failure about yourself  - - - - - - 1  Trouble concentrating - - - - - - 3  Moving slowly or fidgety/restless - - - - - - 2  Suicidal thoughts - - - - - - 1  PHQ-9 Score - - - - - - 18  Some recent data might be hidden     Review of Systems  Constitutional: Positive for appetite change, chills, fever and unexpected weight change.  HENT: Negative.   Eyes: Negative.   Respiratory: Positive for cough and shortness of breath.   Cardiovascular: Negative.   Gastrointestinal: Positive for abdominal pain and nausea.  Endocrine: Negative.   Genitourinary: Negative.   Musculoskeletal: Positive for joint swelling.  Skin: Negative.   Allergic/Immunologic: Negative.   Neurological: Negative.   Hematological: Negative.   Psychiatric/Behavioral: Negative.   All other systems reviewed and are negative.      Objective:   Physical Exam  Constitutional: She is oriented to person, place, and time. She appears well-developed and well-nourished.  HENT:  Head: Normocephalic and atraumatic.  Neck: Normal range of motion. Neck supple.  Cardiovascular: Normal rate and regular  rhythm.  Pulmonary/Chest: Effort normal and breath sounds normal.  Musculoskeletal:  Normal Muscle Bulk and Muscle Testing Reveals:  Upper Extremities: Full ROM and Muscle Strength 5/5 Lumbar Paraspinal Tenderness: L-4-L-5 Lower Extremities: Full ROM and Muscle Strength 5/5 Arises from Table with ease Narrow Based Gait  Neurological: She is alert and oriented to person, place, and time.  Skin: Skin is warm and dry.  Psychiatric: She has a normal mood and affect.  Nursing note and vitals reviewed.  Assessment & Plan:  1. History of chronic lumbar facet disease with spondylosis/DDD/ L4-5 spondylolisthesis.With L4-5 L5-S1 decompression stabilization: 10/08/2017 Refilled: Hydrocodone 10/325mg  one tablet every 6 hours as needed #120.  We will continue the opioid monitoring program, this consists of regular clinic visits, examinations, urine drug screen, pill counts as well as use of [place] Controlled Substance Reporting System. 2. Depression with anxiety/ PTSD: Dr. Sima Matas Following.  Continue Xanax, and Effexor. 10/08/2017. 3. Cervical spondylosis: Stable at this time with no complaints. Continue Medication Regime. 10/08/2017 4. 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 Continue Ritalin 10 mg one tablet twice a day 0700 and noon #60.10/08/2017 5. Muscle Spasm: Continue Flexeril: May take 1-2 tablets at HS. 10/08/2017   F/U in 1 month

## 2017-10-11 ENCOUNTER — Encounter: Payer: 59 | Admitting: Psychology

## 2017-11-09 ENCOUNTER — Encounter: Payer: Self-pay | Admitting: Registered Nurse

## 2017-11-09 ENCOUNTER — Encounter: Payer: 59 | Attending: Physical Medicine and Rehabilitation | Admitting: Registered Nurse

## 2017-11-09 ENCOUNTER — Other Ambulatory Visit: Payer: Self-pay

## 2017-11-09 VITALS — BP 145/85 | HR 94

## 2017-11-09 DIAGNOSIS — M961 Postlaminectomy syndrome, not elsewhere classified: Secondary | ICD-10-CM

## 2017-11-09 DIAGNOSIS — F3289 Other specified depressive episodes: Secondary | ICD-10-CM | POA: Diagnosis not present

## 2017-11-09 DIAGNOSIS — G894 Chronic pain syndrome: Secondary | ICD-10-CM

## 2017-11-09 DIAGNOSIS — Z5181 Encounter for therapeutic drug level monitoring: Secondary | ICD-10-CM

## 2017-11-09 DIAGNOSIS — Z79899 Other long term (current) drug therapy: Secondary | ICD-10-CM

## 2017-11-09 DIAGNOSIS — M47817 Spondylosis without myelopathy or radiculopathy, lumbosacral region: Secondary | ICD-10-CM | POA: Diagnosis not present

## 2017-11-09 DIAGNOSIS — F411 Generalized anxiety disorder: Secondary | ICD-10-CM

## 2017-11-09 DIAGNOSIS — M5416 Radiculopathy, lumbar region: Secondary | ICD-10-CM

## 2017-11-09 DIAGNOSIS — F4310 Post-traumatic stress disorder, unspecified: Secondary | ICD-10-CM

## 2017-11-09 DIAGNOSIS — F0781 Postconcussional syndrome: Secondary | ICD-10-CM

## 2017-11-09 DIAGNOSIS — M461 Sacroiliitis, not elsewhere classified: Secondary | ICD-10-CM | POA: Diagnosis present

## 2017-11-09 MED ORDER — HYDROCODONE-ACETAMINOPHEN 10-325 MG PO TABS: 1.0000 | ORAL_TABLET | Freq: Four times a day (QID) | ORAL | 0 refills | Status: DC | PRN

## 2017-11-09 MED ORDER — METHYLPHENIDATE HCL 10 MG PO TABS: 10.0000 mg | ORAL_TABLET | Freq: Two times a day (BID) | ORAL | 0 refills | Status: DC

## 2017-11-09 NOTE — Progress Notes (Signed)
Subjective:    Patient ID: [name], female    DOB: 13-Nov-1955, 62 y.o.   MRN: [id number]  HPI: [name] is a 62year old female who returns for follow up appointmentfor chronic pain and medication refill. She states her pain is located in her lower back pain radiating into her bilateral hips.[name] states her pain has increased in intensity with weather changes. She rates her pain 9. Her current exercise regime is walking.  [name] states two weeks ago she fell over her magazine rack and fell forward landingon her magazine rack. She was able to pick herself up. She didn't seek medical attention.   [name] Morphine equivalent is 38.67 MME. She is also prescribed Alprazolam. We have reviewed the black box warning again regarding using opioids and benzodiazepines. I highlighted the dangers of using these drugs together and discussed the adverse events including respiratory suppression, overdose, cognitive impairment and importance of  compliance with current regimen. She verbalizes understanding, we will continue to monitor and adjust as indicated.    [name] last UDS was on 06/13/18it was consistent. Oral Swab Performed Today.   Pain Inventory Average Pain 9 Pain Right Now 9 My pain is sharp, burning, dull, stabbing, tingling and aching  In the last 24 hours, has pain interfered with the following? General activity 10 Relation with others 10 Enjoyment of life 10 What TIME of day is your pain at its worst? . Sleep (in general) Fair  Pain is worse with: walking, bending, sitting, inactivity, standing and some activites Pain improves with: rest, heat/ice, pacing activities and medication Relief from Meds: 3 lately  Mobility walk without assistance use a cane ability to climb steps?  yes do you drive?  yes  Function disabled: date disabled 2015 I need assistance with the following:  dressing, meal prep, household duties and  shopping  Neuro/Psych weakness numbness tremor tingling trouble walking spasms dizziness confusion depression anxiety loss of taste or smell  Prior Studies Any changes since last visit?  no  Physicians involved in your care Any changes since last visit?  no   Family History  Problem Relation Age of Onset  . Cervical cancer Mother   . Diabetes Mother   . Stroke Mother   . Heart attack Father   . Stroke Father   . Colon cancer Father   . Anxiety disorder Maternal Aunt   . Depression Maternal Aunt   . Anxiety disorder Maternal Uncle   . Depression Maternal Uncle        suicide attempt by gun shot wound to head. Survived  . Alcohol abuse Other   . Drug abuse Other   . Breast cancer Maternal Grandmother   . Diabetes Maternal Grandmother   . Breast cancer Paternal Aunt   . Colon polyps Sister        x 2   Social History   Socioeconomic History  . Marital status: Married    Spouse name: None  . Number of children: 0  . Years of education: None  . Highest education level: None  Social Needs  . Financial resource strain: None  . Food insecurity - worry: None  . Food insecurity - inability: None  . Transportation needs - medical: None  . Transportation needs - non-medical: None  Occupational History  . Occupation: disabled  Tobacco Use  . Smoking status: Never Smoker  . Smokeless tobacco: Never Used  Substance and Sexual Activity  . Alcohol use: No  Alcohol/week: 0.0 oz  . Drug use: No  . Sexual activity: Yes  Other Topics Concern  . None  Social History Narrative   Pt has h.s. degree   Past Surgical History:  Procedure Laterality Date  . ABDOMINAL HYSTERECTOMY  1999  . bunions removed  1988  . C4 C5 cage insertion  06/28/2013  . CHOLECYSTECTOMY  04/25/2008  . EDG  12/17/2004  . ELECTROCARDIOGRAM  05/27/2007  . L5 S1 rod insertion  11/07/2012  . SKIN CANCER EXCISION    . SKIN CANCER EXCISION     several  . TONSILLECTOMY  1981  . TUBAL LIGATION   1997   Past Medical History:  Diagnosis Date  . Anemia   . ANXIETY 06/10/2007  . ASYMPTOMATIC POSTMENOPAUSAL STATUS 07/19/2009  . Chronic headaches   . Cough 04/02/2009  . Degenerative arthritis of spine    back and neck  . Depression   . DYSLIPIDEMIA 06/28/2008  . Esophageal stricture   . Fibromyalgia   . Gallstones   . GERD 06/10/2007  . Hiatal hernia   . 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 HYPERGLYCEMIA 06/28/2008  . HYPERTENSION 06/10/2007  . Irritable bowel syndrome 06/28/2008  . OSTEOARTHRITIS 06/10/2007  . Pneumonia   . Skin cancer    basal cell  . Tubular adenoma of colon   . UPPER RESPIRATORY INFECTION 03/26/2009  . WEIGHT GAIN 01/21/2010   BP (!) 145/85   Pulse 94   SpO2 94%   Opioid Risk Score:  1 Fall Risk Score:  `1  Depression screen PHQ 2/9  Depression screen [facility] 2/9 11/09/2017 10/08/2017 08/09/2017 04/28/2017 01/19/2017 03/10/2016 06/10/2015  Decreased Interest 1 - 1 1 0 0 1  Down, Depressed, Hopeless 1 - 1 1 1 1 1   PHQ - 2 Score 2 - 2 2 1 1 2   Altered sleeping - 1 - - - - -  Tired, decreased energy - 1 - - - - -  Change in appetite - - - - - - -  Feeling bad or failure about yourself  - - - - - - -  Trouble concentrating - - - - - - -  Moving slowly or fidgety/restless - - - - - - -  Suicidal thoughts - - - - - - -  PHQ-9 Score - - - - - - -  Some recent data might be hidden     Review of Systems  Constitutional: Positive for appetite change, chills, fever and unexpected weight change.  HENT: Negative.   Eyes: Negative.   Respiratory: Positive for cough and shortness of breath.   Cardiovascular: Negative.   Gastrointestinal: Positive for abdominal pain and nausea.  Endocrine: Negative.   Genitourinary: Negative.   Musculoskeletal: Positive for joint swelling.  Skin: Negative.   Allergic/Immunologic: Negative.   Neurological: Negative.   Hematological: Negative.   Psychiatric/Behavioral: Negative.   All other systems reviewed and are negative.      Objective:    Physical Exam  Constitutional: She is oriented to person, place, and time. She appears well-developed and well-nourished.  HENT:  Head: Normocephalic and atraumatic.  Neck: Normal range of motion. Neck supple.  Cardiovascular: Normal rate and regular rhythm.  Pulmonary/Chest: Effort normal and breath sounds normal.  Musculoskeletal:  Normal Muscle Bulk and Muscle Testing Reveals:  Upper Extremities: Full ROM and Muscle Strength 5/5 Lumbar Paraspinal Tenderness: L-3-L-5 Lower Extremities: Full ROM and Muscle Strength 5/5 Arises from Table with ease Narrow Based Gait  Neurological: She is alert and  oriented to person, place, and time.  Skin: Skin is warm and dry.  Psychiatric: She has a normal mood and affect.  Nursing note and vitals reviewed.         Assessment & Plan:  1. History of chronic lumbar facet disease with spondylosis/DDD/ L4-5 spondylolisthesis.With L4-5 L5-S1 decompression stabilization: 11/09/2017 Refilled: Hydrocodone 10/325mg  one tablet every 6 hours as needed #120.  We will continue the opioid monitoring program, this consists of regular clinic visits, examinations, urine drug screen, pill counts as well as use of [place] Controlled Substance Reporting System. 2. Depression with anxiety/ PTSD: Dr. Sima Matas Following.  Continue Xanax, and Effexor. 11/09/2017. 3. Cervical spondylosis: Stable at this time with no complaints. Continue Medication Regime. 11/09/2017 4. 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 Continue Ritalin 10 mg one tablet twice a day 0700 and noon #60.11/09/2017 5. Muscle Spasm: Continue Flexeril: May take 1-2 tablets at HS. 11/09/2017  F/U in 1 month

## 2017-11-10 DIAGNOSIS — M2550 Pain in unspecified joint: Secondary | ICD-10-CM | POA: Diagnosis not present

## 2017-11-15 LAB — DRUG TOX METHYLPHEN W/CONF,ORAL FLD
METHYLPHENIDATE: POSITIVE ng/mL — AB (ref <1.0)
Methylphenidate: 3.9 ng/mL — ABNORMAL HIGH (ref <1.0)

## 2017-11-15 LAB — DRUG TOX MONITOR 1 W/CONF, ORAL FLD
Amphetamines: NEGATIVE ng/mL (ref <10)
Barbiturates: NEGATIVE ng/mL (ref <10)
Benzodiazepines: NEGATIVE ng/mL (ref <0.50)
Buprenorphine: NEGATIVE ng/mL (ref <0.10)
COCAINE: NEGATIVE ng/mL (ref <5.0)
Codeine: NEGATIVE ng/mL (ref <2.5)
DIHYDROCODEINE: NEGATIVE ng/mL (ref <2.5)
FENTANYL: NEGATIVE ng/mL (ref <0.10)
HYDROCODONE: 24.8 ng/mL — AB (ref <2.5)
Heroin Metabolite: NEGATIVE ng/mL (ref <1.0)
Hydromorphone: NEGATIVE ng/mL (ref <2.5)
MARIJUANA: NEGATIVE ng/mL (ref <2.5)
MDMA: NEGATIVE ng/mL (ref <10)
METHADONE: NEGATIVE ng/mL (ref <5.0)
Meprobamate: NEGATIVE ng/mL (ref <2.5)
Morphine: NEGATIVE ng/mL (ref <2.5)
NICOTINE METABOLITE: NEGATIVE ng/mL (ref <5.0)
Norhydrocodone: NEGATIVE ng/mL (ref <2.5)
Noroxycodone: NEGATIVE ng/mL (ref <2.5)
OPIATES: POSITIVE ng/mL — AB (ref <2.5)
OXYCODONE: NEGATIVE ng/mL (ref <2.5)
Oxymorphone: NEGATIVE ng/mL (ref <2.5)
PHENCYCLIDINE: NEGATIVE ng/mL (ref <10)
TRAMADOL: NEGATIVE ng/mL (ref <5.0)
Tapentadol: NEGATIVE ng/mL (ref <5.0)
ZOLPIDEM: NEGATIVE ng/mL (ref <5.0)

## 2017-11-15 LAB — DRUG TOX ALC METAB W/CON, ORAL FLD: ALCOHOL METABOLITE: NEGATIVE ng/mL (ref <25)

## 2017-11-17 ENCOUNTER — Telehealth: Payer: Self-pay | Admitting: *Deleted

## 2017-11-17 NOTE — Telephone Encounter (Signed)
Oral swab drug screen was consistent for prescribed medications. Alprazolam was taken on the same day as test but did not show. It was present on last UDS so not sure if sensitivity is less on swab and is why it is negative. Narcotics were present.

## 2017-11-25 DIAGNOSIS — Z1231 Encounter for screening mammogram for malignant neoplasm of breast: Secondary | ICD-10-CM | POA: Diagnosis not present

## 2017-11-25 LAB — HM DEXA SCAN

## 2017-11-25 LAB — HM MAMMOGRAPHY

## 2017-11-30 ENCOUNTER — Encounter: Payer: Self-pay | Admitting: Registered Nurse

## 2017-11-30 ENCOUNTER — Encounter: Payer: 59 | Attending: Physical Medicine and Rehabilitation | Admitting: Registered Nurse

## 2017-11-30 VITALS — BP 97/46 | HR 100

## 2017-11-30 DIAGNOSIS — Z5181 Encounter for therapeutic drug level monitoring: Secondary | ICD-10-CM

## 2017-11-30 DIAGNOSIS — F411 Generalized anxiety disorder: Secondary | ICD-10-CM | POA: Diagnosis present

## 2017-11-30 DIAGNOSIS — F4310 Post-traumatic stress disorder, unspecified: Secondary | ICD-10-CM | POA: Diagnosis not present

## 2017-11-30 DIAGNOSIS — F0781 Postconcussional syndrome: Secondary | ICD-10-CM

## 2017-11-30 DIAGNOSIS — M47817 Spondylosis without myelopathy or radiculopathy, lumbosacral region: Secondary | ICD-10-CM

## 2017-11-30 DIAGNOSIS — M461 Sacroiliitis, not elsewhere classified: Secondary | ICD-10-CM | POA: Insufficient documentation

## 2017-11-30 DIAGNOSIS — M961 Postlaminectomy syndrome, not elsewhere classified: Secondary | ICD-10-CM | POA: Diagnosis not present

## 2017-11-30 DIAGNOSIS — M5416 Radiculopathy, lumbar region: Secondary | ICD-10-CM | POA: Diagnosis not present

## 2017-11-30 DIAGNOSIS — Z79899 Other long term (current) drug therapy: Secondary | ICD-10-CM | POA: Diagnosis not present

## 2017-11-30 DIAGNOSIS — F3289 Other specified depressive episodes: Secondary | ICD-10-CM

## 2017-11-30 DIAGNOSIS — G894 Chronic pain syndrome: Secondary | ICD-10-CM | POA: Diagnosis not present

## 2017-11-30 MED ORDER — CYCLOBENZAPRINE HCL 5 MG PO TABS: ORAL_TABLET | ORAL | 3 refills | Status: DC

## 2017-11-30 MED ORDER — HYDROCODONE-ACETAMINOPHEN 10-325 MG PO TABS: 1.0000 | ORAL_TABLET | Freq: Four times a day (QID) | ORAL | 0 refills | Status: DC | PRN

## 2017-11-30 MED ORDER — METHYLPHENIDATE HCL 10 MG PO TABS: 10.0000 mg | ORAL_TABLET | Freq: Two times a day (BID) | ORAL | 0 refills | Status: DC

## 2017-11-30 NOTE — Progress Notes (Signed)
Subjective:    Patient ID: [name], female    DOB: [birth year], 62 y.o.   MRN: [id number]  HPI: [name] is a 62year old female who returns for follow up appointmentfor chronic pain and medication refill. She states her pain is located in her lower back radiating into her right lower extremity. She rates her pain 8. Her current exercise regime is walking.   [name] Morphine equivalent is 37.33 MME. She is also prescribed Alprazolam. We have reviewed the black box warning again regarding using opioids and benzodiazepines. I highlighted the dangers of using these drugs together and discussed the adverse events including respiratory suppression, overdose, cognitive impairment and importance of  compliance with current regimen. She verbalizes understanding, we will continue to monitor and adjust as indicated.     Oral Swab Performed on 11/09/2017 it was consistent.   Pain Inventory Average Pain 8 Pain Right Now 8 My pain is sharp, burning, dull, stabbing, tingling and aching  In the last 24 hours, has pain interfered with the following? General activity 10 Relation with others 10 Enjoyment of life 10 What TIME of day is your pain at its worst? n/a Sleep (in general) Fair  Pain is worse with: walking, bending, sitting, inactivity, standing, unsure and some activites Pain improves with: rest, heat/ice, pacing activities, medication and injections Relief from Meds: 7 lately  Mobility use a cane how many minutes can you walk? 30 ability to climb steps?  yes do you drive?  yes  Function not employed: date last employed 11/2008 disabled: date disabled 02/2014  Neuro/Psych bladder control problems weakness numbness tremor tingling trouble walking spasms dizziness confusion depression anxiety loss of taste or smell  Prior Studies Any changes since last visit?  yes bone scan mammogram  Physicians involved in your care Any changes since last visit?   yes Rheumatologist Dr. Amil Amen Dr. Royston Sinner, Gynecologist    Family History  Problem Relation Age of Onset  . Cervical cancer Mother   . Diabetes Mother   . Stroke Mother   . Heart attack Father   . Stroke Father   . Colon cancer Father   . Anxiety disorder Maternal Aunt   . Depression Maternal Aunt   . Anxiety disorder Maternal Uncle   . Depression Maternal Uncle        suicide attempt by gun shot wound to head. Survived  . Alcohol abuse Other   . Drug abuse Other   . Breast cancer Maternal Grandmother   . Diabetes Maternal Grandmother   . Breast cancer Paternal Aunt   . Colon polyps Sister        x 2   Social History   Socioeconomic History  . Marital status: Married    Spouse name: Not on file  . Number of children: 0  . Years of education: Not on file  . Highest education level: Not on file  Social Needs  . Financial resource strain: Not on file  . Food insecurity - worry: Not on file  . Food insecurity - inability: Not on file  . Transportation needs - medical: Not on file  . Transportation needs - non-medical: Not on file  Occupational History  . Occupation: disabled  Tobacco Use  . Smoking status: Never Smoker  . Smokeless tobacco: Never Used  Substance and Sexual Activity  . Alcohol use: No    Alcohol/week: 0.0 oz  . Drug use: No  . Sexual activity: Yes  Other Topics Concern  .  Not on file  Social History Narrative   Pt has h.s. degree   Past Surgical History:  Procedure Laterality Date  . ABDOMINAL HYSTERECTOMY  1999  . bunions removed  1988  . C4 C5 cage insertion  06/28/2013  . CHOLECYSTECTOMY  04/25/2008  . EDG  12/17/2004  . ELECTROCARDIOGRAM  05/27/2007  . L5 S1 rod insertion  11/07/2012  . SKIN CANCER EXCISION    . SKIN CANCER EXCISION     several  . TONSILLECTOMY  1981  . TUBAL LIGATION  1997   Past Medical History:  Diagnosis Date  . Anemia   . ANXIETY 06/10/2007  . ASYMPTOMATIC POSTMENOPAUSAL STATUS 07/19/2009  . Chronic headaches   .  Cough 04/02/2009  . Degenerative arthritis of spine    back and neck  . Depression   . DYSLIPIDEMIA 06/28/2008  . Esophageal stricture   . Fibromyalgia   . Gallstones   . GERD 06/10/2007  . Hiatal hernia   . 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 HYPERGLYCEMIA 06/28/2008  . HYPERTENSION 06/10/2007  . Irritable bowel syndrome 06/28/2008  . OSTEOARTHRITIS 06/10/2007  . Pneumonia   . Skin cancer    basal cell  . Tubular adenoma of colon   . UPPER RESPIRATORY INFECTION 03/26/2009  . WEIGHT GAIN 01/21/2010   There were no vitals taken for this visit.  Opioid Risk Score:  1 Fall Risk Score:  `1  Depression screen PHQ 2/9  Depression screen [facility] 2/9 11/09/2017 10/08/2017 08/09/2017 04/28/2017 01/19/2017 03/10/2016 06/10/2015  Decreased Interest 1 - 1 1 0 0 1  Down, Depressed, Hopeless 1 - 1 1 1 1 1   PHQ - 2 Score 2 - 2 2 1 1 2   Altered sleeping - 1 - - - - -  Tired, decreased energy - 1 - - - - -  Change in appetite - - - - - - -  Feeling bad or failure about yourself  - - - - - - -  Trouble concentrating - - - - - - -  Moving slowly or fidgety/restless - - - - - - -  Suicidal thoughts - - - - - - -  PHQ-9 Score - - - - - - -  Some recent data might be hidden     Review of Systems  Constitutional: Positive for appetite change, chills, fever and unexpected weight change.  HENT: Negative.   Eyes: Negative.   Respiratory: Positive for cough and shortness of breath.   Cardiovascular: Negative.   Gastrointestinal: Positive for abdominal pain, nausea and vomiting.  Endocrine: Negative.        High blood sugar   Genitourinary: Negative.   Musculoskeletal: Positive for joint swelling.  Skin: Positive for rash.  Allergic/Immunologic: Negative.   Neurological: Positive for tremors, weakness and numbness.       Tingling Spasms Loss of taste or smell  Hematological: Bruises/bleeds easily.  Psychiatric/Behavioral: Positive for confusion and dysphoric mood. The patient is nervous/anxious.   All other systems  reviewed and are negative.      Objective:   Physical Exam  Constitutional: She is oriented to person, place, and time. She appears well-developed and well-nourished.  HENT:  Head: Normocephalic and atraumatic.  Neck: Normal range of motion. Neck supple.  Cardiovascular: Normal rate and regular rhythm.  Pulmonary/Chest: Effort normal and breath sounds normal.  Musculoskeletal:  Normal Muscle Bulk and Muscle Testing Reveals:  Upper Extremities: Full ROM and Muscle Strength 5/5 Lumbar Paraspinal Tenderness: L-3- L-5 Lower Extremities: Full ROM  and Muscle Strength 5/5 Arises from Table with ease Narrow Based Gait  Neurological: She is alert and oriented to person, place, and time.  Skin: Skin is warm and dry.  Psychiatric: She has a normal mood and affect.  Nursing note and vitals reviewed.         Assessment & Plan:  1. History of chronic lumbar facet disease with spondylosis/DDD/ L4-5 spondylolisthesis.With L4-5 L5-S1 decompression stabilization: 11/30/2017 Refilled: Hydrocodone 10/325mg  one tablet every 6 hours as needed #120.  We will continue the opioid monitoring program, this consists of regular clinic visits, examinations, urine drug screen, pill counts as well as use of [place] Controlled Substance Reporting System. 2. Depression with anxiety/ PTSD: Dr. Sima Matas Following.  Continue Xanax, and Effexor. 11/30/2017. 3. Cervical spondylosis: Stable at this time with no complaints. Continue Medication Regime. 11/30/2017 4. 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 Continue Ritalin 10 mg one tablet twice a day 0700 and noon #60.11/30/2017 5. Muscle Spasm: Continue Flexeril: May take 1-2 tablets at HS. 11/30/2017  F/U in 1 month

## 2017-12-14 ENCOUNTER — Encounter: Payer: Self-pay | Admitting: Endocrinology

## 2017-12-15 ENCOUNTER — Other Ambulatory Visit: Payer: Self-pay | Admitting: Endocrinology

## 2017-12-15 MED ORDER — IRBESARTAN-HYDROCHLOROTHIAZIDE 150-12.5 MG PO TABS: 0.5000 | ORAL_TABLET | Freq: Every day | ORAL | 3 refills | Status: DC

## 2017-12-17 ENCOUNTER — Other Ambulatory Visit: Payer: Self-pay | Admitting: Registered Nurse

## 2017-12-22 ENCOUNTER — Other Ambulatory Visit: Payer: Self-pay

## 2017-12-22 ENCOUNTER — Other Ambulatory Visit: Payer: Self-pay | Admitting: Endocrinology

## 2017-12-22 DIAGNOSIS — I10 Essential (primary) hypertension: Secondary | ICD-10-CM

## 2017-12-22 DIAGNOSIS — M47817 Spondylosis without myelopathy or radiculopathy, lumbosacral region: Secondary | ICD-10-CM

## 2017-12-22 MED ORDER — ACYCLOVIR 800 MG PO TABS: 800.0000 mg | ORAL_TABLET | Freq: Every day | ORAL | 3 refills | Status: DC

## 2017-12-28 ENCOUNTER — Encounter: Payer: 59 | Attending: Physical Medicine and Rehabilitation | Admitting: Registered Nurse

## 2017-12-28 ENCOUNTER — Encounter: Payer: Self-pay | Admitting: Registered Nurse

## 2017-12-28 VITALS — BP 131/76 | HR 102

## 2017-12-28 DIAGNOSIS — F0781 Postconcussional syndrome: Secondary | ICD-10-CM

## 2017-12-28 DIAGNOSIS — F411 Generalized anxiety disorder: Secondary | ICD-10-CM | POA: Insufficient documentation

## 2017-12-28 DIAGNOSIS — M961 Postlaminectomy syndrome, not elsewhere classified: Secondary | ICD-10-CM

## 2017-12-28 DIAGNOSIS — F3289 Other specified depressive episodes: Secondary | ICD-10-CM | POA: Diagnosis not present

## 2017-12-28 DIAGNOSIS — Z79899 Other long term (current) drug therapy: Secondary | ICD-10-CM | POA: Diagnosis not present

## 2017-12-28 DIAGNOSIS — G894 Chronic pain syndrome: Secondary | ICD-10-CM | POA: Diagnosis not present

## 2017-12-28 DIAGNOSIS — F4310 Post-traumatic stress disorder, unspecified: Secondary | ICD-10-CM

## 2017-12-28 DIAGNOSIS — Z5181 Encounter for therapeutic drug level monitoring: Secondary | ICD-10-CM

## 2017-12-28 DIAGNOSIS — M5416 Radiculopathy, lumbar region: Secondary | ICD-10-CM | POA: Diagnosis not present

## 2017-12-28 DIAGNOSIS — M461 Sacroiliitis, not elsewhere classified: Secondary | ICD-10-CM | POA: Insufficient documentation

## 2017-12-28 DIAGNOSIS — M47817 Spondylosis without myelopathy or radiculopathy, lumbosacral region: Secondary | ICD-10-CM | POA: Insufficient documentation

## 2017-12-28 MED ORDER — HYDROCODONE-ACETAMINOPHEN 10-325 MG PO TABS: 1.0000 | ORAL_TABLET | Freq: Four times a day (QID) | ORAL | 0 refills | Status: DC | PRN

## 2017-12-28 MED ORDER — METHYLPHENIDATE HCL 10 MG PO TABS: 10.0000 mg | ORAL_TABLET | Freq: Two times a day (BID) | ORAL | 0 refills | Status: DC

## 2017-12-28 MED ORDER — ALPRAZOLAM 0.5 MG PO TABS: 0.5000 mg | ORAL_TABLET | Freq: Three times a day (TID) | ORAL | 2 refills | Status: DC | PRN

## 2017-12-28 NOTE — Progress Notes (Signed)
Subjective:    Patient ID: [name], female    DOB: 1956/02/02, 62 y.o.   MRN: [id number]  HPI: [name] is a 62year old female who returns for follow up appointmentfor chronic pain and medication refill. She states her pain is located in her lower back radiating into her left buttock and left lower extremity. She rates her pain 8. Her current exercise regime is walking.   [name] Morphine equivalent is 41.33 MME. She is also prescribed Alprazolam. We have reviewed the black box warning  regarding using opioids and benzodiazepines. I highlighted the dangers of using these drugs together and discussed the adverse events including respiratory suppression, overdose, cognitive impairment and importance of  compliance with current regimen. She verbalizes understanding, we will continue to monitor and adjust as indicated.     Oral Swab Performed on 11/09/2017 it was consistent.   Pain Inventory Average Pain 8 Pain Right Now 8 My pain is sharp, burning, dull, stabbing, tingling and aching  In the last 24 hours, has pain interfered with the following? General activity 10 Relation with others 10 Enjoyment of life 10 What TIME of day is your pain at its worst? n/a Sleep (in general) Fair  Pain is worse with: walking, bending, sitting, inactivity, standing, unsure and some activites Pain improves with: rest, heat/ice, pacing activities, medication and injections Relief from Meds: 3 lately  Mobility use a cane how many minutes can you walk? 30 ability to climb steps?  yes do you drive?  yes  Function not employed: date last employed 11/2008 disabled: date disabled 02/2014  Neuro/Psych bladder control problems weakness numbness tremor tingling trouble walking spasms dizziness confusion depression anxiety loss of taste or smell  Prior Studies Any changes since last visit?  yes bone scan mammogram  Physicians involved in your care Any changes since last  visit?  yes Rheumatologist Dr. Amil Amen Dr. Royston Sinner, Gynecologist    Family History  Problem Relation Age of Onset  . Cervical cancer Mother   . Diabetes Mother   . Stroke Mother   . Heart attack Father   . Stroke Father   . Colon cancer Father   . Anxiety disorder Maternal Aunt   . Depression Maternal Aunt   . Anxiety disorder Maternal Uncle   . Depression Maternal Uncle        suicide attempt by gun shot wound to head. Survived  . Alcohol abuse Other   . Drug abuse Other   . Breast cancer Maternal Grandmother   . Diabetes Maternal Grandmother   . Breast cancer Paternal Aunt   . Colon polyps Sister        x 2   Social History   Socioeconomic History  . Marital status: Married    Spouse name: Not on file  . Number of children: 0  . Years of education: Not on file  . Highest education level: Not on file  Social Needs  . Financial resource strain: Not on file  . Food insecurity - worry: Not on file  . Food insecurity - inability: Not on file  . Transportation needs - medical: Not on file  . Transportation needs - non-medical: Not on file  Occupational History  . Occupation: disabled  Tobacco Use  . Smoking status: Never Smoker  . Smokeless tobacco: Never Used  Substance and Sexual Activity  . Alcohol use: No    Alcohol/week: 0.0 oz  . Drug use: No  . Sexual activity: Yes  Other Topics  Concern  . Not on file  Social History Narrative   Pt has h.s. degree   Past Surgical History:  Procedure Laterality Date  . ABDOMINAL HYSTERECTOMY  1999  . bunions removed  1988  . C4 C5 cage insertion  06/28/2013  . CHOLECYSTECTOMY  04/25/2008  . EDG  12/17/2004  . ELECTROCARDIOGRAM  05/27/2007  . L5 S1 rod insertion  11/07/2012  . SKIN CANCER EXCISION    . SKIN CANCER EXCISION     several  . TONSILLECTOMY  1981  . TUBAL LIGATION  1997   Past Medical History:  Diagnosis Date  . Anemia   . ANXIETY 06/10/2007  . ASYMPTOMATIC POSTMENOPAUSAL STATUS 07/19/2009  . Chronic  headaches   . Cough 04/02/2009  . Degenerative arthritis of spine    back and neck  . Depression   . DYSLIPIDEMIA 06/28/2008  . Esophageal stricture   . Fibromyalgia   . Gallstones   . GERD 06/10/2007  . Hiatal hernia   . 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 HYPERGLYCEMIA 06/28/2008  . HYPERTENSION 06/10/2007  . Irritable bowel syndrome 06/28/2008  . OSTEOARTHRITIS 06/10/2007  . Pneumonia   . Skin cancer    basal cell  . Tubular adenoma of colon   . UPPER RESPIRATORY INFECTION 03/26/2009  . WEIGHT GAIN 01/21/2010   There were no vitals taken for this visit.  Opioid Risk Score:  1 Fall Risk Score:  `1  Depression screen PHQ 2/9  Depression screen [facility] 2/9 11/09/2017 10/08/2017 08/09/2017 04/28/2017 01/19/2017 03/10/2016 06/10/2015  Decreased Interest 1 - 1 1 0 0 1  Down, Depressed, Hopeless 1 - 1 1 1 1 1   PHQ - 2 Score 2 - 2 2 1 1 2   Altered sleeping - 1 - - - - -  Tired, decreased energy - 1 - - - - -  Change in appetite - - - - - - -  Feeling bad or failure about yourself  - - - - - - -  Trouble concentrating - - - - - - -  Moving slowly or fidgety/restless - - - - - - -  Suicidal thoughts - - - - - - -  PHQ-9 Score - - - - - - -  Some recent data might be hidden     Review of Systems  Constitutional: Positive for appetite change, chills, fever and unexpected weight change.  HENT: Negative.   Eyes: Negative.   Respiratory: Positive for cough and shortness of breath.   Cardiovascular: Negative.   Gastrointestinal: Positive for abdominal pain, nausea and vomiting.  Endocrine: Negative.        High blood sugar   Genitourinary: Negative.   Musculoskeletal: Positive for back pain and joint swelling.  Skin: Positive for rash.  Allergic/Immunologic: Negative.   Neurological: Positive for tremors, weakness and numbness.       Tingling Spasms Loss of taste or smell  Hematological: Bruises/bleeds easily.  Psychiatric/Behavioral: Positive for confusion and dysphoric mood. The patient is  nervous/anxious.   All other systems reviewed and are negative.      Objective:   Physical Exam  Constitutional: She is oriented to person, place, and time. She appears well-developed and well-nourished.  HENT:  Head: Normocephalic and atraumatic.  Neck: Normal range of motion. Neck supple.  Cardiovascular: Normal rate and regular rhythm.  Pulmonary/Chest: Effort normal and breath sounds normal.  Musculoskeletal:  Normal Muscle Bulk and Muscle Testing Reveals:  Upper Extremities: Full ROM and Muscle Strength 5/5 Lumbar Paraspinal Tenderness:  L-3-L-5 Lower Extremities: Full ROM and Muscle Strength 5/5 Arises from Table with ease Narrow Based Gait  Neurological: She is alert and oriented to person, place, and time.  Skin: Skin is warm and dry.  Psychiatric: She has a normal mood and affect.  Nursing note and vitals reviewed.         Assessment & Plan:  1. History of chronic lumbar facet disease with spondylosis/DDD/ L4-5 spondylolisthesis.With L4-5 L5-S1 decompression stabilization: 12/28/2017 Refilled: Hydrocodone 10/325mg  one tablet every 6 hours as needed #120.  We will continue the opioid monitoring program, this consists of regular clinic visits, examinations, urine drug screen, pill counts as well as use of [place] Controlled Substance Reporting System. 2. Depression with anxiety/ PTSD: Dr. Sima Matas Following.  Continue Xanax, and Effexor. 12/28/2017. 3. Cervical spondylosis: Stable at this time with no complaints. Continue Medication Regime. 12/28/2017 4. 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 Continue Ritalin 10 mg one tablet twice a day 0700 and noon #60.12/28/2017 5. Muscle Spasm: Continue Flexeril: May take 1-2 tablets at HS. 12/28/2017  F/U in 1 month

## 2018-01-25 ENCOUNTER — Encounter: Payer: Self-pay | Admitting: Registered Nurse

## 2018-01-25 ENCOUNTER — Encounter: Payer: 59 | Attending: Physical Medicine and Rehabilitation | Admitting: Registered Nurse

## 2018-01-25 ENCOUNTER — Other Ambulatory Visit: Payer: Self-pay

## 2018-01-25 VITALS — BP 123/70 | HR 101 | Ht 61.0 in | Wt 168.2 lb

## 2018-01-25 DIAGNOSIS — Z79899 Other long term (current) drug therapy: Secondary | ICD-10-CM | POA: Diagnosis not present

## 2018-01-25 DIAGNOSIS — F3289 Other specified depressive episodes: Secondary | ICD-10-CM

## 2018-01-25 DIAGNOSIS — G894 Chronic pain syndrome: Secondary | ICD-10-CM

## 2018-01-25 DIAGNOSIS — M961 Postlaminectomy syndrome, not elsewhere classified: Secondary | ICD-10-CM

## 2018-01-25 DIAGNOSIS — M25511 Pain in right shoulder: Secondary | ICD-10-CM | POA: Diagnosis not present

## 2018-01-25 DIAGNOSIS — M461 Sacroiliitis, not elsewhere classified: Secondary | ICD-10-CM | POA: Diagnosis present

## 2018-01-25 DIAGNOSIS — F411 Generalized anxiety disorder: Secondary | ICD-10-CM

## 2018-01-25 DIAGNOSIS — M47817 Spondylosis without myelopathy or radiculopathy, lumbosacral region: Secondary | ICD-10-CM | POA: Insufficient documentation

## 2018-01-25 DIAGNOSIS — F4310 Post-traumatic stress disorder, unspecified: Secondary | ICD-10-CM | POA: Diagnosis not present

## 2018-01-25 DIAGNOSIS — M5416 Radiculopathy, lumbar region: Secondary | ICD-10-CM

## 2018-01-25 DIAGNOSIS — Z5181 Encounter for therapeutic drug level monitoring: Secondary | ICD-10-CM | POA: Diagnosis not present

## 2018-01-25 DIAGNOSIS — F0781 Postconcussional syndrome: Secondary | ICD-10-CM | POA: Diagnosis not present

## 2018-01-25 MED ORDER — HYDROCODONE-ACETAMINOPHEN 10-325 MG PO TABS: 1.0000 | ORAL_TABLET | Freq: Four times a day (QID) | ORAL | 0 refills | Status: DC | PRN

## 2018-01-25 MED ORDER — METHYLPHENIDATE HCL 10 MG PO TABS: 10.0000 mg | ORAL_TABLET | Freq: Two times a day (BID) | ORAL | 0 refills | Status: DC

## 2018-01-25 NOTE — Progress Notes (Signed)
Subjective:    Patient ID: [name], female    DOB: 05-May-1956, 62 y.o.   MRN: [id number]  HPI: [name] is a 62 year old female who returns for follow up appointment for chronic pain and medication refill. She states her pain is located in her right shoulder, lower back radiating into his left lower extremity. Also reports her right shoulder pain has increased in intensity, X-ray ordered, she verbalizes understanding. She rates her pain 8. Her current exercise regime is walking.   [name] Morphine Equivalent 41.33 MME. She is also prescribed Alprazolam. We have reviewed the black box warning regarding using opioids and benzodiazepines. I highlighted the danger of using these drugs together and discussed the adverse events including respiratory suppression, overdose, cognitive impairment and importance of compliance with current regimen. She verbalizes understanding, we will continue to monitor and adjust as indicted.   Last Oral Swab was Performed on 11/09/2017 it was consistent.   Pain Inventory Average Pain 8 Pain Right Now 8 My pain is sharp, burning, dull, stabbing, tingling and aching  In the last 24 hours, has pain interfered with the following? General activity 10 Relation with others 10 Enjoyment of life 10 What TIME of day is your pain at its worst? n/a Sleep (in general) Fair  Pain is worse with: walking, bending, sitting, inactivity, standing, unsure and some activites Pain improves with: rest, heat/ice, pacing activities and medication Relief from Meds: 7  Mobility walk with assistance use a walker how many minutes can you walk? 30 to 60 ability to climb steps?  yes do you drive?  yes Do you have any goals in this area?  yes  Function disabled: date disabled 2015 I need assistance with the following:  dressing, meal prep, household duties and shopping Do you have any goals in this area?  yes  Neuro/Psych bladder control problems bowel control  problems weakness numbness tremor tingling trouble walking spasms dizziness confusion depression anxiety loss of taste or smell  Prior Studies Any changes since last visit?  no  Physicians involved in your care Any changes since last visit?  no   Family History  Problem Relation Age of Onset  . Cervical cancer Mother   . Diabetes Mother   . Stroke Mother   . Heart attack Father   . Stroke Father   . Colon cancer Father   . Anxiety disorder Maternal Aunt   . Depression Maternal Aunt   . Anxiety disorder Maternal Uncle   . Depression Maternal Uncle        suicide attempt by gun shot wound to head. Survived  . Alcohol abuse Other   . Drug abuse Other   . Breast cancer Maternal Grandmother   . Diabetes Maternal Grandmother   . Breast cancer Paternal Aunt   . Colon polyps Sister        x 2   Social History   Socioeconomic History  . Marital status: Married    Spouse name: Not on file  . Number of children: 0  . Years of education: Not on file  . Highest education level: Not on file  Occupational History  . Occupation: disabled  Social Needs  . Financial resource strain: Not on file  . Food insecurity:    Worry: Not on file    Inability: Not on file  . Transportation needs:    Medical: Not on file    Non-medical: Not on file  Tobacco Use  . Smoking status:  Never Smoker  . Smokeless tobacco: Never Used  Substance and Sexual Activity  . Alcohol use: No    Alcohol/week: 0.0 oz  . Drug use: No  . Sexual activity: Yes  Lifestyle  . Physical activity:    Days per week: Not on file    Minutes per session: Not on file  . Stress: Not on file  Relationships  . Social connections:    Talks on phone: Not on file    Gets together: Not on file    Attends religious service: Not on file    Active member of club or organization: Not on file    Attends meetings of clubs or organizations: Not on file    Relationship status: Not on file  Other Topics Concern  .  Not on file  Social History Narrative   Pt has h.s. degree   Past Surgical History:  Procedure Laterality Date  . ABDOMINAL HYSTERECTOMY  1999  . bunions removed  1988  . C4 C5 cage insertion  06/28/2013  . CHOLECYSTECTOMY  04/25/2008  . EDG  12/17/2004  . ELECTROCARDIOGRAM  05/27/2007  . L5 S1 rod insertion  11/07/2012  . SKIN CANCER EXCISION    . SKIN CANCER EXCISION     several  . TONSILLECTOMY  1981  . TUBAL LIGATION  1997   Past Medical History:  Diagnosis Date  . Anemia   . ANXIETY 06/10/2007  . ASYMPTOMATIC POSTMENOPAUSAL STATUS 07/19/2009  . Chronic headaches   . Cough 04/02/2009  . Degenerative arthritis of spine    back and neck  . Depression   . DYSLIPIDEMIA 06/28/2008  . Esophageal stricture   . Fibromyalgia   . Gallstones   . GERD 06/10/2007  . Hiatal hernia   . 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 HYPERGLYCEMIA 06/28/2008  . HYPERTENSION 06/10/2007  . Irritable bowel syndrome 06/28/2008  . OSTEOARTHRITIS 06/10/2007  . Pneumonia   . Skin cancer    basal cell  . Tubular adenoma of colon   . UPPER RESPIRATORY INFECTION 03/26/2009  . WEIGHT GAIN 01/21/2010   There were no vitals taken for this visit.  Opioid Risk Score:   Fall Risk Score:  `1  Depression screen PHQ 2/9  Depression screen [facility] 2/9 01/25/2018 11/09/2017 10/08/2017 08/09/2017 04/28/2017 01/19/2017 03/10/2016  Decreased Interest 3 1 - 1 1 0 0  Down, Depressed, Hopeless 2 1 - 1 1 1 1   PHQ - 2 Score 5 2 - 2 2 1 1   Altered sleeping 0 - 1 - - - -  Tired, decreased energy 3 - 1 - - - -  Change in appetite 1 - - - - - -  Feeling bad or failure about yourself  1 - - - - - -  Trouble concentrating 2 - - - - - -  Moving slowly or fidgety/restless 0 - - - - - -  Suicidal thoughts 0 - - - - - -  PHQ-9 Score 12 - - - - - -  Some recent data might be hidden    Review of Systems  Constitutional: Positive for fever and unexpected weight change.  HENT: Negative.   Eyes: Negative.   Respiratory: Negative.   Cardiovascular:  Negative.   Gastrointestinal: Positive for abdominal pain, constipation, diarrhea and nausea.  Endocrine: Negative.   Genitourinary: Positive for difficulty urinating, dyspareunia and dysuria.  Musculoskeletal: Negative.   Skin: Negative.   Allergic/Immunologic: Negative.   Neurological: Negative.   Hematological: Negative.   Psychiatric/Behavioral: Negative.  All other systems reviewed and are negative.      Objective:   Physical Exam  Constitutional: She is oriented to person, place, and time. She appears well-developed and well-nourished. No distress.  HENT:  Head: Normocephalic and atraumatic.  Neck: Normal range of motion. Neck supple.  Cardiovascular: Normal rate and regular rhythm.  Pulmonary/Chest: Effort normal and breath sounds normal.  Musculoskeletal:  Normal Muscle Bulk and Muscle Testing Reveals: Upper Extremities: Right: Decreased ROM 45 Degrees and Muscle Strength 4/5 Right AC Joint Tenderness Left: Full ROM and Muscle Strength 5/5 Thoracic Paraspinal Tenderness: T-1-T-3 Mainly Right  Lumbar Paraspinal Tenderness: L-3-L-5 Lower Extremities: Full ROM and Muscle Strength 5/5 Arises from Table with ease Narrow Based Gait  Neurological: She is alert and oriented to person, place, and time.  Skin: Skin is warm and dry.  Psychiatric: She has a normal mood and affect.  Nursing note and vitals reviewed.         Assessment & Plan:  1. History of chronic lumbar facet disease with spondylosis/DDD/ L4-5 spondylolisthesis.With L4-5 L5-S1 decompression stabilization: 01/25/2018 Refilled: Hydrocodone 10/325mg  one tablet every 6 hours as needed #120.  We will continue the opioid monitoring program, this consists of regular clinic visits, examinations, urine drug screen, pill counts as well as use of [place] Controlled Substance Reporting System. 2. Depression with anxiety/ PTSD: Dr. Sima Matas Following.  Continue Xanax, and Effexor. 01/25/2018. 3. Cervical  spondylosis: Stable at this time with no complaints. Continue Medication Regime. 01/25/2018 4. 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 Continue Ritalin 10 mg one tablet twice a day 0700 and noon #60.01/25/2018 5. Muscle Spasm: Continue current medication regimen with Flexeril: May take 1-2 tablets at HS. 01/25/2018 6. Right Shoulder Pain: RX: X-ray.  F/U in 1 month

## 2018-02-22 DIAGNOSIS — N952 Postmenopausal atrophic vaginitis: Secondary | ICD-10-CM | POA: Diagnosis not present

## 2018-02-23 ENCOUNTER — Encounter: Payer: Self-pay | Admitting: Endocrinology

## 2018-02-23 ENCOUNTER — Ambulatory Visit (INDEPENDENT_AMBULATORY_CARE_PROVIDER_SITE_OTHER): Payer: 59 | Admitting: Endocrinology

## 2018-02-23 VITALS — BP 140/82 | HR 107 | Ht 61.0 in | Wt 169.0 lb

## 2018-02-23 DIAGNOSIS — R079 Chest pain, unspecified: Secondary | ICD-10-CM

## 2018-02-23 DIAGNOSIS — R002 Palpitations: Secondary | ICD-10-CM

## 2018-02-23 NOTE — Progress Notes (Signed)
Subjective:    Patient ID: [name], female    DOB: 04/05/1956, 62 y.o.   MRN: [id number]  HPI 1 year of intermitt palpitations in the chest, and assoc sob.  Each episode lasts a few minutes. She gets the episodes daily.     Past Medical History:  Diagnosis Date  . Anemia   . ANXIETY 06/10/2007  . ASYMPTOMATIC POSTMENOPAUSAL STATUS 07/19/2009  . Chronic headaches   . Cough 04/02/2009  . Degenerative arthritis of spine    back and neck  . Depression   . DYSLIPIDEMIA 06/28/2008  . Esophageal stricture   . Fibromyalgia   . Gallstones   . GERD 06/10/2007  . Hiatal hernia   . 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 HYPERGLYCEMIA 06/28/2008  . HYPERTENSION 06/10/2007  . Irritable bowel syndrome 06/28/2008  . OSTEOARTHRITIS 06/10/2007  . Pneumonia   . Skin cancer    basal cell  . Tubular adenoma of colon   . UPPER RESPIRATORY INFECTION 03/26/2009  . WEIGHT GAIN 01/21/2010    Past Surgical History:  Procedure Laterality Date  . ABDOMINAL HYSTERECTOMY  1999  . bunions removed  1988  . C4 C5 cage insertion  06/28/2013  . CHOLECYSTECTOMY  04/25/2008  . EDG  12/17/2004  . ELECTROCARDIOGRAM  05/27/2007  . L5 S1 rod insertion  11/07/2012  . SKIN CANCER EXCISION    . SKIN CANCER EXCISION     several  . TONSILLECTOMY  1981  . TUBAL LIGATION  1997    Social History   Socioeconomic History  . Marital status: Married    Spouse name: Not on file  . Number of children: 0  . Years of education: Not on file  . Highest education level: Not on file  Occupational History  . Occupation: disabled  Social Needs  . Financial resource strain: Not on file  . Food insecurity:    Worry: Not on file    Inability: Not on file  . Transportation needs:    Medical: Not on file    Non-medical: Not on file  Tobacco Use  . Smoking status: Never Smoker  . Smokeless tobacco: Never Used  Substance and Sexual Activity  . Alcohol use: No    Alcohol/week: 0.0 oz  . Drug use: No  . Sexual activity: Yes  Lifestyle  .  Physical activity:    Days per week: Not on file    Minutes per session: Not on file  . Stress: Not on file  Relationships  . Social connections:    Talks on phone: Not on file    Gets together: Not on file    Attends religious service: Not on file    Active member of club or organization: Not on file    Attends meetings of clubs or organizations: Not on file    Relationship status: Not on file  . Intimate partner violence:    Fear of current or ex partner: Not on file    Emotionally abused: Not on file    Physically abused: Not on file    Forced sexual activity: Not on file  Other Topics Concern  . Not on file  Social History Narrative   Pt has h.s. degree    Current Outpatient Medications on File Prior to Visit  Medication Sig Dispense Refill  . acyclovir (ZOVIRAX) 800 MG tablet Take 1 tablet (800 mg total) by mouth daily. 90 tablet 3  . ALPRAZolam (XANAX) 0.5 MG tablet Take 1 tablet (0.5 mg total) by mouth  3 (three) times daily as needed for anxiety. 90 tablet 2  . cyclobenzaprine (FLEXERIL) 5 MG tablet TAKE 1 TABLET AT BEDTIME AS NEEDED FOR MUSCLE SPASM MAY TAKE AN EXTRA TABLET AS NEEDED 45 tablet 3  . HYDROcodone-acetaminophen (NORCO) 10-325 MG tablet Take 1 tablet by mouth every 6 (six) hours as needed. 120 tablet 0  . irbesartan-hydrochlorothiazide (AVALIDE) 150-12.5 MG tablet Take 0.5 tablets by mouth daily. 45 tablet 3  . meclizine (ANTIVERT) 12.5 MG tablet TAKE 1 TABLET (12.5 MG TOTAL) BY MOUTH 3 (THREE) TIMES DAILY AS NEEDED. 45 tablet 2  . methylphenidate (RITALIN) 10 MG tablet Take 1 tablet (10 mg total) by mouth 2 (two) times daily with breakfast and lunch. 8315,1761 60 tablet 0  . ranitidine (ZANTAC) 150 MG capsule Take 1 capsule (150 mg total) by mouth every evening. 30 capsule 6  . venlafaxine XR (EFFEXOR-XR) 150 MG 24 hr capsule TAKE 1 CAPSULE DAILY WITH BREAKFAST 90 capsule 3   No current facility-administered medications on file prior to visit.     Allergies    Allergen Reactions  . Gabapentin Other (See Comments)    Bad headaches  . Poison Ivy Extract [Poison Ivy Extract] Rash  . Poison Oak Extract [Poison Oak Extract] Rash  . Poison Sumac Extract Rash    Family History  Problem Relation Age of Onset  . Cervical cancer Mother   . Diabetes Mother   . Stroke Mother   . Heart attack Father   . Stroke Father   . Colon cancer Father   . Anxiety disorder Maternal Aunt   . Depression Maternal Aunt   . Anxiety disorder Maternal Uncle   . Depression Maternal Uncle        suicide attempt by gun shot wound to head. Survived  . Alcohol abuse Other   . Drug abuse Other   . Breast cancer Maternal Grandmother   . Diabetes Maternal Grandmother   . Breast cancer Paternal Aunt   . Colon polyps Sister        x 2    BP 140/82 (BP Location: Left Arm, Patient Position: Sitting, Cuff Size: Normal)   Pulse (!) 107   Ht 5\' 1"  (1.549 m)   Wt 169 lb (76.7 kg)   SpO2 97%   BMI 31.93 kg/m    Review of Systems She has gained weight.  No LOC    Objective:   Physical Exam VITAL SIGNS:  See vs page.   GENERAL: no distress.  LUNGS:  Clear to auscultation. HEART:  Regular rate and rhythm without murmurs noted. Normal S1,S2.    Lab Results  Component Value Date   TSH 4.18 08/10/2017   T3TOTAL 130.2 03/22/2014   Lab Results  Component Value Date   WBC 4.3 08/10/2017   HGB 13.2 08/10/2017   HCT 39.7 08/10/2017   MCV 87.0 08/10/2017   PLT 205.0 08/10/2017   Lab Results  Component Value Date   CREATININE 0.65 08/10/2017   BUN 10 08/10/2017   NA 139 08/10/2017   K 3.5 08/10/2017   CL 101 08/10/2017   CO2 30 08/10/2017   I personally reviewed electrocardiogram tracing (today): Indication: palpitations Impression: NSR.  No MI.  No hypertrophy.  NS-TWA Compared to: no significant change      Assessment & Plan:  Palpitations, new, uncertain etiology. Weight gain: we discussed possibility of doing more labs.  She wants to hold off for  now   Patient Instructions  Let's recheck the heart monitor.  you will receive a phone call, about a day and time for an appointment.

## 2018-02-23 NOTE — Patient Instructions (Signed)
Let's recheck the heart monitor.  you will receive a phone call, about a day and time for an appointment.

## 2018-02-24 ENCOUNTER — Encounter: Payer: 59 | Attending: Physical Medicine and Rehabilitation | Admitting: Psychology

## 2018-02-24 ENCOUNTER — Encounter: Payer: Self-pay | Admitting: Psychology

## 2018-02-24 DIAGNOSIS — F4310 Post-traumatic stress disorder, unspecified: Secondary | ICD-10-CM | POA: Diagnosis not present

## 2018-02-24 DIAGNOSIS — F411 Generalized anxiety disorder: Secondary | ICD-10-CM | POA: Diagnosis present

## 2018-02-24 DIAGNOSIS — M961 Postlaminectomy syndrome, not elsewhere classified: Secondary | ICD-10-CM | POA: Diagnosis not present

## 2018-02-24 DIAGNOSIS — M461 Sacroiliitis, not elsewhere classified: Secondary | ICD-10-CM | POA: Diagnosis present

## 2018-02-24 DIAGNOSIS — F0781 Postconcussional syndrome: Secondary | ICD-10-CM

## 2018-02-24 DIAGNOSIS — M47817 Spondylosis without myelopathy or radiculopathy, lumbosacral region: Secondary | ICD-10-CM | POA: Diagnosis not present

## 2018-02-24 NOTE — Progress Notes (Signed)
Patient:                           [name]           DOB:                               August 23, 1956  MR Number:                  [id number]  Location:                        [facility] [address] [place] [id number] [place] [address] Dept: [phone]  Start:  2 PM End:                                3 PM  Provider/Observer:                           Edgardo Roys PSYD  Chief Complaint:                                   Chief Complaint  Patient presents with  . Post-Traumatic Stress Disorder  . Pain  . Depression  . Memory Loss  . Anxiety    Reason For Service:                         [name] is a 62 year old female referred by Dr. Naaman Plummer due to issues of coping and dealing with a number of stressors. She has a history of postconcussion syndrome, chronic pain syndrome as well as PTSD.  The patient has been dealing with significant anxiety and depression along with a history of PTSD and postconcussion syndrome. The patient was referred for psychotherapeutic interventions primarily because of the issues of depression and coping with family stress.  The above reason for service was reviewed and remains accurate.  The patient reports that there has been significant improvements in psychosocial issues but there continues to be a lot of stress particularly around issues related to her mother's dementia and the mother not recognizing the patient.  Interventions Strategy:                    Cognitive/behavioral psychotherapeutic interventions   Participation Level:                           Active  Participation Quality:                       Appropriate and Attentive                            Behavioral Observation:                  Well Groomed, Alert, and Appropriate.   Current Psychosocial Factors:        The patient reports that she is continue  to work on psychosocial issues particularly around dealing with the impact of her PTSD and anxiety/depression.  The patient reports that direct conflicts between her and her sisters have significantly reduced recently as the patient has generally avoided being around them.  Content of Session:                           Reviewed current symptoms and continue to work on therapeutic issues around pain, PTSD symptoms and depressive/anxiety.  Current Status:                                   The patient reports that she is continued to improve overall and is feeling less anxiety and depressive type symptoms.  She reports that she has had a lot of sadness around issues with her mother but overall she is doing better on a day-to-day basis.  Patient Progress:                               The patient continues to show improvement in her overall functioning.  Impression/Diagnosis:                     [name] is a 62 year old female referred by Dr. Naaman Plummer due to issues of coping and dealing with a number of stressors. She has a history of postconcussion syndrome, chronic pain syndrome as well as PTSD. The patient describes significant difficulty with issues related to her family and the family estate. There were issues when her father passed away where an older sister was in charge of everything and all of his possessions were been ago to this older sister. However, the middle sister began having trouble and when the patient could not help the sister rejected the patient. There've been a number of family conflicts going on including a niece who got in trouble with doing drugs and her excuse to the patient's sister was that the patient was the one who started her doing drugs and giving her money. This is not accurate.  The above impression/diagnostic considerations continue to be accurate and valid for this appointment.   Diagnosis:   Lumbar post-laminectomy syndrome  Lumbosacral spondylosis without  myelopathy  PTSD (post-traumatic stress disorder)  GAD (generalized anxiety disorder)  Post concussion syndrome

## 2018-03-01 ENCOUNTER — Encounter: Payer: Self-pay | Admitting: Registered Nurse

## 2018-03-01 ENCOUNTER — Encounter (HOSPITAL_BASED_OUTPATIENT_CLINIC_OR_DEPARTMENT_OTHER): Payer: 59 | Admitting: Registered Nurse

## 2018-03-01 VITALS — BP 107/71 | HR 93 | Resp 14 | Ht 61.0 in | Wt 171.0 lb

## 2018-03-01 DIAGNOSIS — Z5181 Encounter for therapeutic drug level monitoring: Secondary | ICD-10-CM

## 2018-03-01 DIAGNOSIS — M961 Postlaminectomy syndrome, not elsewhere classified: Secondary | ICD-10-CM | POA: Diagnosis not present

## 2018-03-01 DIAGNOSIS — F3289 Other specified depressive episodes: Secondary | ICD-10-CM

## 2018-03-01 DIAGNOSIS — M5416 Radiculopathy, lumbar region: Secondary | ICD-10-CM | POA: Diagnosis not present

## 2018-03-01 DIAGNOSIS — F0781 Postconcussional syndrome: Secondary | ICD-10-CM | POA: Diagnosis not present

## 2018-03-01 DIAGNOSIS — F411 Generalized anxiety disorder: Secondary | ICD-10-CM | POA: Diagnosis not present

## 2018-03-01 DIAGNOSIS — Z79891 Long term (current) use of opiate analgesic: Secondary | ICD-10-CM

## 2018-03-01 DIAGNOSIS — G894 Chronic pain syndrome: Secondary | ICD-10-CM

## 2018-03-01 DIAGNOSIS — M47817 Spondylosis without myelopathy or radiculopathy, lumbosacral region: Secondary | ICD-10-CM | POA: Diagnosis not present

## 2018-03-01 DIAGNOSIS — F4310 Post-traumatic stress disorder, unspecified: Secondary | ICD-10-CM

## 2018-03-01 MED ORDER — METHYLPHENIDATE HCL 10 MG PO TABS: 10.0000 mg | ORAL_TABLET | Freq: Two times a day (BID) | ORAL | 0 refills | Status: DC

## 2018-03-01 MED ORDER — HYDROCODONE-ACETAMINOPHEN 10-325 MG PO TABS: 1.0000 | ORAL_TABLET | Freq: Four times a day (QID) | ORAL | 0 refills | Status: DC | PRN

## 2018-03-01 NOTE — Progress Notes (Signed)
Subjective:    Patient ID: [name], female    DOB: Dec 26, 1955, 62 y.o.   MRN: [id number]  HPI: [name] is a 62 year old female who returns for follow up appointment for chronic pain and medication refill. She states her pain is located in her lower back radiating into her left lower extremity and left knee pain. She rates her pain 9. Her current exercise regime is walking and gardening.   Also states she is scheduled for Holter Cardiac Monitor she's been having palpations, PCP following.  Also reports she has been experiencing dizziness, she states she will follow up with her PCP Dr. Loanne Drilling.   [name] is 40.00 MME. She is also prescribed alprazolam. We have discussed the black box warning of using opioids and benzodiazepines. I highlighted the dangers of using these drugs together and discussed the adverse events including respiratory suppression, overdose, cognitive impairment and importance of compliance with current regimen. We will continue to monitor and adjust as indicated.   Last Oral Swab was Performed on 11/09/2017, it was consistent. UDS ordered today.   Pain Inventory Average Pain 8 Pain Right Now 9 My pain is sharp, burning, dull, stabbing, tingling and aching  In the last 24 hours, has pain interfered with the following? General activity 10 Relation with others 10 Enjoyment of life 9 What TIME of day is your pain at its worst? varies Sleep (in general) Fair  Pain is worse with: walking, bending, sitting, inactivity, standing and some activites Pain improves with: rest, heat/ice, therapy/exercise, medication and injections Relief from Meds: 9  Mobility walk without assistance how many minutes can you walk? 30-60 ability to climb steps?  yes do you drive?  yes transfers alone Do you have any goals in this area?  yes  Function disabled: date disabled . I need assistance with the following:  dressing, meal prep, household  duties and shopping Do you have any goals in this area?  no  Neuro/Psych weakness numbness tremor tingling trouble walking spasms dizziness confusion depression anxiety  Prior Studies Any changes since last visit?  no  Physicians involved in your care Any changes since last visit?  no   Family History  Problem Relation Age of Onset  . Cervical cancer Mother   . Diabetes Mother   . Stroke Mother   . Heart attack Father   . Stroke Father   . Colon cancer Father   . Anxiety disorder Maternal Aunt   . Depression Maternal Aunt   . Anxiety disorder Maternal Uncle   . Depression Maternal Uncle        suicide attempt by gun shot wound to head. Survived  . Alcohol abuse Other   . Drug abuse Other   . Breast cancer Maternal Grandmother   . Diabetes Maternal Grandmother   . Breast cancer Paternal Aunt   . Colon polyps Sister        x 2   Social History   Socioeconomic History  . Marital status: Married    Spouse name: Not on file  . Number of children: 0  . Years of education: Not on file  . Highest education level: Not on file  Occupational History  . Occupation: disabled  Social Needs  . Financial resource strain: Not on file  . Food insecurity:    Worry: Not on file    Inability: Not on file  . Transportation needs:    Medical: Not on file  Non-medical: Not on file  Tobacco Use  . Smoking status: Never Smoker  . Smokeless tobacco: Never Used  Substance and Sexual Activity  . Alcohol use: No    Alcohol/week: 0.0 oz  . Drug use: No  . Sexual activity: Yes  Lifestyle  . Physical activity:    Days per week: Not on file    Minutes per session: Not on file  . Stress: Not on file  Relationships  . Social connections:    Talks on phone: Not on file    Gets together: Not on file    Attends religious service: Not on file    Active member of club or organization: Not on file    Attends meetings of clubs or organizations: Not on file    Relationship  status: Not on file  Other Topics Concern  . Not on file  Social History Narrative   Pt has h.s. degree   Past Surgical History:  Procedure Laterality Date  . ABDOMINAL HYSTERECTOMY  1999  . bunions removed  1988  . C4 C5 cage insertion  06/28/2013  . CHOLECYSTECTOMY  04/25/2008  . EDG  12/17/2004  . ELECTROCARDIOGRAM  05/27/2007  . L5 S1 rod insertion  11/07/2012  . SKIN CANCER EXCISION    . SKIN CANCER EXCISION     several  . TONSILLECTOMY  1981  . TUBAL LIGATION  1997   Past Medical History:  Diagnosis Date  . Anemia   . ANXIETY 06/10/2007  . ASYMPTOMATIC POSTMENOPAUSAL STATUS 07/19/2009  . Chronic headaches   . Cough 04/02/2009  . Degenerative arthritis of spine    back and neck  . Depression   . DYSLIPIDEMIA 06/28/2008  . Esophageal stricture   . Fibromyalgia   . Gallstones   . GERD 06/10/2007  . Hiatal hernia   . 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 HYPERGLYCEMIA 06/28/2008  . HYPERTENSION 06/10/2007  . Irritable bowel syndrome 06/28/2008  . OSTEOARTHRITIS 06/10/2007  . Pneumonia   . Skin cancer    basal cell  . Tubular adenoma of colon   . UPPER RESPIRATORY INFECTION 03/26/2009  . WEIGHT GAIN 01/21/2010   BP 107/71 (BP Location: Right Arm, Patient Position: Sitting, Cuff Size: Normal)   Pulse 93   Resp 14   Ht 5\' 1"  (1.549 m)   Wt 171 lb (77.6 kg)   SpO2 98%   BMI 32.31 kg/m   Opioid Risk Score:   Fall Risk Score:  `1  Depression screen PHQ 2/9  Depression screen [facility] 2/9 01/25/2018 11/09/2017 10/08/2017 08/09/2017 04/28/2017 01/19/2017 03/10/2016  Decreased Interest 3 1 - 1 1 0 0  Down, Depressed, Hopeless 2 1 - 1 1 1 1   PHQ - 2 Score 5 2 - 2 2 1 1   Altered sleeping 0 - 1 - - - -  Tired, decreased energy 3 - 1 - - - -  Change in appetite 1 - - - - - -  Feeling bad or failure about yourself  1 - - - - - -  Trouble concentrating 2 - - - - - -  Moving slowly or fidgety/restless 0 - - - - - -  Suicidal thoughts 0 - - - - - -  PHQ-9 Score 12 - - - - - -  Some recent data might be  hidden    Review of Systems  Constitutional: Positive for unexpected weight change.  HENT: Negative.   Eyes: Negative.   Respiratory: Positive for cough and shortness of breath.  Cardiovascular: Negative.   Gastrointestinal: Negative.   Endocrine: Negative.   Genitourinary: Negative.   Musculoskeletal: Positive for arthralgias, back pain, myalgias and neck pain.       Spasms   Skin: Negative.   Allergic/Immunologic: Negative.   Neurological: Positive for dizziness, tremors, weakness and numbness.       Tingling   Hematological: Negative.   Psychiatric/Behavioral: Positive for confusion and dysphoric mood. The patient is nervous/anxious.        Objective:   Physical Exam  Constitutional: She is oriented to person, place, and time. She appears well-developed and well-nourished.  HENT:  Head: Normocephalic and atraumatic.  Neck: Normal range of motion. Neck supple.  Cardiovascular: Normal rate and regular rhythm.  Pulmonary/Chest: Effort normal and breath sounds normal.  Musculoskeletal:  Normal Muscle Bulk and Muscle testing Reveals: Upper Extremities: Full ROM and Muscle Strength 5/5 Lumbar Paraspinal Tenderness: L-3-L-5 Lower Extremities: Full ROM and Muscle Strength 5/5 Left Lower Extremity Flexion Produces Pain into Patella Arises from Table with ease Narrow Based Gait  Neurological: She is alert and oriented to person, place, and time.  Skin: Skin is warm and dry.  Psychiatric: She has a normal mood and affect.  Nursing note and vitals reviewed.         Assessment & Plan:  1. History of chronic lumbar facet disease with spondylosis/DDD/ L4-5 spondylolisthesis.With L4-5 L5-S1 decompression stabilization: 03/01/2018 Refilled: Hydrocodone 10/325mg  one tablet every 6 hours as needed #120.  We will continue the opioid monitoring program, this consists of regular clinic visits, examinations, urine drug screen, pill counts as well as use of [place]  Controlled Substance Reporting System. 2. Depression with anxiety/ PTSD: Dr. Sima Matas Following.  Continue Xanax, and Effexor. 03/01/2018. 3. Cervical spondylosis: Stable at this time with no complaints. Continue Medication Regime. 03/01/2018 4. 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 Continue Ritalin 10 mg one tablet twice a day 0700 and noon #60.03/01/2018 5. Muscle Spasm: Continue current medication regimen with Flexeril: May take 1-2 tablets at HS. 03/01/2018  F/U in 1 month

## 2018-03-02 ENCOUNTER — Encounter: Payer: Self-pay | Admitting: Endocrinology

## 2018-03-09 LAB — TOXASSURE SELECT,+ANTIDEPR,UR

## 2018-03-10 ENCOUNTER — Telehealth: Payer: Self-pay | Admitting: *Deleted

## 2018-03-10 NOTE — Telephone Encounter (Signed)
Urine drug screen for this encounter is consistent for prescribed medication 

## 2018-03-13 ENCOUNTER — Encounter: Payer: Self-pay | Admitting: Endocrinology

## 2018-03-14 ENCOUNTER — Other Ambulatory Visit: Payer: Self-pay | Admitting: Registered Nurse

## 2018-03-18 NOTE — Telephone Encounter (Signed)
Patient is scheduled for Holter Monitor on 03/29/18 at 1400 at the [facility] [address] office.   All [facility] procedures/orders will fall into a "que" if location is selected and called by appropriate staff for scheduling from there. Let me know if there are any further issues.

## 2018-03-28 ENCOUNTER — Other Ambulatory Visit: Payer: Self-pay

## 2018-03-28 ENCOUNTER — Encounter: Payer: Self-pay | Admitting: Registered Nurse

## 2018-03-28 ENCOUNTER — Encounter: Payer: 59 | Attending: Physical Medicine and Rehabilitation | Admitting: Registered Nurse

## 2018-03-28 VITALS — BP 145/87 | HR 101 | Ht 61.0 in | Wt 168.8 lb

## 2018-03-28 DIAGNOSIS — F0781 Postconcussional syndrome: Secondary | ICD-10-CM | POA: Diagnosis not present

## 2018-03-28 DIAGNOSIS — F411 Generalized anxiety disorder: Secondary | ICD-10-CM | POA: Insufficient documentation

## 2018-03-28 DIAGNOSIS — G894 Chronic pain syndrome: Secondary | ICD-10-CM

## 2018-03-28 DIAGNOSIS — F3289 Other specified depressive episodes: Secondary | ICD-10-CM

## 2018-03-28 DIAGNOSIS — M461 Sacroiliitis, not elsewhere classified: Secondary | ICD-10-CM | POA: Diagnosis present

## 2018-03-28 DIAGNOSIS — Z9181 History of falling: Secondary | ICD-10-CM | POA: Diagnosis not present

## 2018-03-28 DIAGNOSIS — M961 Postlaminectomy syndrome, not elsewhere classified: Secondary | ICD-10-CM

## 2018-03-28 DIAGNOSIS — Z79891 Long term (current) use of opiate analgesic: Secondary | ICD-10-CM | POA: Diagnosis not present

## 2018-03-28 DIAGNOSIS — Z5181 Encounter for therapeutic drug level monitoring: Secondary | ICD-10-CM | POA: Diagnosis not present

## 2018-03-28 DIAGNOSIS — M47817 Spondylosis without myelopathy or radiculopathy, lumbosacral region: Secondary | ICD-10-CM | POA: Diagnosis not present

## 2018-03-28 DIAGNOSIS — F4310 Post-traumatic stress disorder, unspecified: Secondary | ICD-10-CM | POA: Diagnosis not present

## 2018-03-28 MED ORDER — CYCLOBENZAPRINE HCL 5 MG PO TABS: ORAL_TABLET | ORAL | 3 refills | Status: DC

## 2018-03-28 MED ORDER — ALPRAZOLAM 0.5 MG PO TABS: 0.5000 mg | ORAL_TABLET | Freq: Three times a day (TID) | ORAL | 2 refills | Status: DC | PRN

## 2018-03-28 MED ORDER — HYDROCODONE-ACETAMINOPHEN 10-325 MG PO TABS: 1.0000 | ORAL_TABLET | Freq: Four times a day (QID) | ORAL | 0 refills | Status: DC | PRN

## 2018-03-28 MED ORDER — METHYLPHENIDATE HCL 10 MG PO TABS: 10.0000 mg | ORAL_TABLET | Freq: Two times a day (BID) | ORAL | 0 refills | Status: DC

## 2018-03-28 NOTE — Progress Notes (Signed)
Subjective:    Patient ID: [name], female    DOB: [birth year], 62 y.o.   MRN: [id number]  HPI: [name] is a 62 year old female who returns for follow up appointment for chronic pain and medication refill. She states her pain is located in her lower back and left knee. She rates her pain 9. Her current exercise regime is walking.  [name] admits she is depressed concerning her family situation concerning her siblings, she see's Dr. Sima Matas for counseling. She believes her Effexor is not working, we discuss speaking with a Psychiatrist for medication management. She will call her insurance company she reports and [employer] was given. She denies any suicidal ideation.   [name] is 40.00 MME. She/he  is also prescribed Alprazolam.We have discussed the black box warning of using opioids and benzodiazepines. I highlighted the dangers of using these drugs together and discussed the adverse events including respiratory suppression, overdose, cognitive impairment and importance of compliance with current regimen. We will continue to monitor and adjust as indicated.   [name] reports she was bringing in groceries in the rain, she missed a step and fell on her left knee. She was able to pick herself up. Educated on Falls prevention, she verbalizes understanding.   Pain Inventory Average Pain 9 Pain Right Now 9 My pain is sharp, burning, dull, stabbing, tingling and aching  In the last 24 hours, has pain interfered with the following? General activity 10 Relation with others 10 Enjoyment of life 10 What TIME of day is your pain at its worst? all Sleep (in general) Fair  Pain is worse with: walking, bending, sitting, inactivity, standing, unsure and some activites Pain improves with: rest, heat/ice, therapy/exercise, pacing activities, medication and injections Relief from Meds: 5  Mobility walk with assistance use a cane use a  walker how many minutes can you walk? not long ability to climb steps?  yes do you drive?  yes transfers alone Do you have any goals in this area?  yes  Function disabled: date disabled 2015 I need assistance with the following:  meal prep, household duties and shopping Do you have any goals in this area?  yes  Neuro/Psych bladder control problems weakness numbness tremor tingling trouble walking spasms dizziness confusion depression anxiety  Prior Studies Any changes since last visit?  yes  Physicians involved in your care Any changes since last visit?  no   Family History  Problem Relation Age of Onset  . Cervical cancer Mother   . Diabetes Mother   . Stroke Mother   . Heart attack Father   . Stroke Father   . Colon cancer Father   . Anxiety disorder Maternal Aunt   . Depression Maternal Aunt   . Anxiety disorder Maternal Uncle   . Depression Maternal Uncle        suicide attempt by gun shot wound to head. Survived  . Alcohol abuse Other   . Drug abuse Other   . Breast cancer Maternal Grandmother   . Diabetes Maternal Grandmother   . Breast cancer Paternal Aunt   . Colon polyps Sister        x 2   Social History   Socioeconomic History  . Marital status: Married    Spouse name: Not on file  . Number of children: 0  . Years of education: Not on file  . Highest education level: Not on file  Occupational History  .  Occupation: disabled  Social Needs  . Financial resource strain: Not on file  . Food insecurity:    Worry: Not on file    Inability: Not on file  . Transportation needs:    Medical: Not on file    Non-medical: Not on file  Tobacco Use  . Smoking status: Never Smoker  . Smokeless tobacco: Never Used  Substance and Sexual Activity  . Alcohol use: No    Alcohol/week: 0.0 oz  . Drug use: No  . Sexual activity: Yes  Lifestyle  . Physical activity:    Days per week: Not on file    Minutes per session: Not on file  . Stress: Not on  file  Relationships  . Social connections:    Talks on phone: Not on file    Gets together: Not on file    Attends religious service: Not on file    Active member of club or organization: Not on file    Attends meetings of clubs or organizations: Not on file    Relationship status: Not on file  Other Topics Concern  . Not on file  Social History Narrative   Pt has h.s. degree   Past Surgical History:  Procedure Laterality Date  . ABDOMINAL HYSTERECTOMY  1999  . bunions removed  1988  . C4 C5 cage insertion  06/28/2013  . CHOLECYSTECTOMY  04/25/2008  . EDG  12/17/2004  . ELECTROCARDIOGRAM  05/27/2007  . L5 S1 rod insertion  11/07/2012  . SKIN CANCER EXCISION    . SKIN CANCER EXCISION     several  . TONSILLECTOMY  1981  . TUBAL LIGATION  1997   Past Medical History:  Diagnosis Date  . Anemia   . ANXIETY 06/10/2007  . ASYMPTOMATIC POSTMENOPAUSAL STATUS 07/19/2009  . Chronic headaches   . Cough 04/02/2009  . Degenerative arthritis of spine    back and neck  . Depression   . DYSLIPIDEMIA 06/28/2008  . Esophageal stricture   . Fibromyalgia   . Gallstones   . GERD 06/10/2007  . Hiatal hernia   . 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 HYPERGLYCEMIA 06/28/2008  . HYPERTENSION 06/10/2007  . Irritable bowel syndrome 06/28/2008  . OSTEOARTHRITIS 06/10/2007  . Pneumonia   . Skin cancer    basal cell  . Tubular adenoma of colon   . UPPER RESPIRATORY INFECTION 03/26/2009  . WEIGHT GAIN 01/21/2010   BP (!) 145/87   Pulse (!) 101   Ht 5\' 1"  (1.549 m)   Wt 168 lb 12.8 oz (76.6 kg)   SpO2 95%   BMI 31.89 kg/m   Opioid Risk Score:   Fall Risk Score:  `1  Depression screen PHQ 2/9  Depression screen [facility] 2/9 03/28/2018 01/25/2018 11/09/2017 10/08/2017 08/09/2017 04/28/2017 01/19/2017  Decreased Interest 3 3 1  - 1 1 0  Down, Depressed, Hopeless 3 2 1  - 1 1 1   PHQ - 2 Score 6 5 2  - 2 2 1   Altered sleeping - 0 - 1 - - -  Tired, decreased energy - 3 - 1 - - -  Change in appetite - 1 - - - - -  Feeling bad or  failure about yourself  - 1 - - - - -  Trouble concentrating - 2 - - - - -  Moving slowly or fidgety/restless - 0 - - - - -  Suicidal thoughts - 0 - - - - -  PHQ-9 Score - 12 - - - - -  Some recent  data might be hidden    Review of Systems  Constitutional: Positive for unexpected weight change.  HENT: Negative.   Eyes: Negative.   Respiratory: Negative.   Cardiovascular: Negative.   Gastrointestinal: Negative.   Endocrine: Negative.   Genitourinary: Negative.   Musculoskeletal: Negative.   Skin: Negative.   Allergic/Immunologic: Negative.   Neurological: Negative.   Hematological: Negative.   Psychiatric/Behavioral: Negative.   All other systems reviewed and are negative.      Objective:   Physical Exam  Constitutional: She is oriented to person, place, and time. She appears well-developed and well-nourished.  HENT:  Head: Normocephalic and atraumatic.  Neck: Normal range of motion. Neck supple.  Cardiovascular: Normal rate and regular rhythm.  Pulmonary/Chest: Effort normal and breath sounds normal.  Musculoskeletal:  Normal Muscle Bulk and Muscle Testing Reveals: Upper Extremities: Full ROM and Muscle Strength 5/5 Lumbar Paraspinal Tenderness: L-3-L-5 Lower Extremities: Full ROM and Muscle Strength 5/5 Left Lower Extremity Flexion Produces Pain into Patella Arises from chair with ease Narrow Based gait  Neurological: She is alert and oriented to person, place, and time.  Skin: Skin is warm and dry.  Psychiatric: She has a normal mood and affect.  Nursing note and vitals reviewed.         Assessment & Plan:  1. History of chronic lumbar facet disease with spondylosis/DDD/ L4-5 spondylolisthesis.With L4-5 L5-S1 decompression stabilization: 03/28/2018 Refilled: Hydrocodone 10/325mg  one tablet every 6 hours as needed #120.  We will continue the opioid monitoring program, this consists of regular clinic visits, examinations, urine drug screen, pill counts as  well as use of [place] Controlled Substance Reporting System. 2. Depression with anxiety/ PTSD: Dr. Sima Matas Following. We discuss obtaining Psychiatrist for medication management. 03/28/2018. Continue Xanax, and Effexor. 01/25/2018. 3. Cervical spondylosis: Stable at this time with no complaints. Continue Medication Regime. 03/28/2018 4. 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 Continue Ritalin 10 mg one tablet twice a day 0700 and noon #60.03/28/2018 5. Muscle Spasm: Continue current medication regimen with Flexeril: May take 1-2 tablets at HS. 03/28/2018 6. Falls Infrequently: Educated on [employer], she verbalizes understanding.   F/U in 1 month

## 2018-03-29 ENCOUNTER — Encounter: Payer: 59 | Admitting: Registered Nurse

## 2018-03-29 ENCOUNTER — Ambulatory Visit (INDEPENDENT_AMBULATORY_CARE_PROVIDER_SITE_OTHER): Payer: 59

## 2018-03-29 DIAGNOSIS — R002 Palpitations: Secondary | ICD-10-CM | POA: Diagnosis not present

## 2018-04-27 ENCOUNTER — Encounter: Payer: Self-pay | Admitting: Physical Medicine & Rehabilitation

## 2018-04-27 ENCOUNTER — Encounter: Payer: 59 | Attending: Physical Medicine and Rehabilitation | Admitting: Physical Medicine & Rehabilitation

## 2018-04-27 VITALS — BP 133/80 | HR 98 | Ht 61.0 in | Wt 167.0 lb

## 2018-04-27 DIAGNOSIS — M47817 Spondylosis without myelopathy or radiculopathy, lumbosacral region: Secondary | ICD-10-CM | POA: Diagnosis not present

## 2018-04-27 DIAGNOSIS — F32A Depression, unspecified: Secondary | ICD-10-CM | POA: Insufficient documentation

## 2018-04-27 DIAGNOSIS — F411 Generalized anxiety disorder: Secondary | ICD-10-CM | POA: Diagnosis present

## 2018-04-27 DIAGNOSIS — F0781 Postconcussional syndrome: Secondary | ICD-10-CM

## 2018-04-27 DIAGNOSIS — F329 Major depressive disorder, single episode, unspecified: Secondary | ICD-10-CM | POA: Insufficient documentation

## 2018-04-27 DIAGNOSIS — F3289 Other specified depressive episodes: Secondary | ICD-10-CM | POA: Diagnosis not present

## 2018-04-27 DIAGNOSIS — M461 Sacroiliitis, not elsewhere classified: Secondary | ICD-10-CM | POA: Insufficient documentation

## 2018-04-27 DIAGNOSIS — G894 Chronic pain syndrome: Secondary | ICD-10-CM

## 2018-04-27 DIAGNOSIS — F4310 Post-traumatic stress disorder, unspecified: Secondary | ICD-10-CM | POA: Diagnosis not present

## 2018-04-27 MED ORDER — HYDROCODONE-ACETAMINOPHEN 10-325 MG PO TABS: 1.0000 | ORAL_TABLET | Freq: Four times a day (QID) | ORAL | 0 refills | Status: DC | PRN

## 2018-04-27 MED ORDER — METHYLPHENIDATE HCL 10 MG PO TABS: 10.0000 mg | ORAL_TABLET | Freq: Two times a day (BID) | ORAL | 0 refills | Status: DC

## 2018-04-27 NOTE — Progress Notes (Signed)
Subjective:    Patient ID: [name], female    DOB: [birth year], 62 y.o.   MRN: [id number]  HPI   [name] is here in follow up of her chronic pain. She has been feeling better as a whole for the last several months. She has found the psychologic counseling extremely helpful given the loss of her father and some of the legal issues which followed.   She stays active with her church and likes to work in her yard, flowers.   From a pain standpoint, her pain, particularly her low back, is controlled. She is never without pain, but is manageable. She remains on hydrocodone for breakthrough symptoms. Her left knee has been a problem at times with swelling and pain at times when she works in her garden.       Pain Inventory Average Pain 8 Pain Right Now 8 My pain is sharp, burning, dull, stabbing, tingling and aching  In the last 24 hours, has pain interfered with the following? General activity 10 Relation with others 10 Enjoyment of life 10 What TIME of day is your pain at its worst? na Sleep (in general) Fair  Pain is worse with: walking, sitting, inactivity, standing, unsure and some activites Pain improves with: rest, heat/ice, pacing activities, medication and injections Relief from Meds: 8  Mobility use a cane ability to climb steps?  yes do you drive?  yes  Function disabled: date disabled 2015  Neuro/Psych bladder control problems weakness numbness tremor tingling trouble walking spasms dizziness depression anxiety loss of taste or smell  Prior Studies Any changes since last visit?  no  Physicians involved in your care Any changes since last visit?  no   Family History  Problem Relation Age of Onset  . Cervical cancer Mother   . Diabetes Mother   . Stroke Mother   . Heart attack Father   . Stroke Father   . Colon cancer Father   . Anxiety disorder Maternal Aunt   . Depression Maternal Aunt   . Anxiety disorder Maternal Uncle   . Depression  Maternal Uncle        suicide attempt by gun shot wound to head. Survived  . Alcohol abuse Other   . Drug abuse Other   . Breast cancer Maternal Grandmother   . Diabetes Maternal Grandmother   . Breast cancer Paternal Aunt   . Colon polyps Sister        x 2   Social History   Socioeconomic History  . Marital status: Married    Spouse name: Not on file  . Number of children: 0  . Years of education: Not on file  . Highest education level: Not on file  Occupational History  . Occupation: disabled  Social Needs  . Financial resource strain: Not on file  . Food insecurity:    Worry: Not on file    Inability: Not on file  . Transportation needs:    Medical: Not on file    Non-medical: Not on file  Tobacco Use  . Smoking status: Never Smoker  . Smokeless tobacco: Never Used  Substance and Sexual Activity  . Alcohol use: No    Alcohol/week: 0.0 oz  . Drug use: No  . Sexual activity: Yes  Lifestyle  . Physical activity:    Days per week: Not on file    Minutes per session: Not on file  . Stress: Not on file  Relationships  . Social connections:    Talks  on phone: Not on file    Gets together: Not on file    Attends religious service: Not on file    Active member of club or organization: Not on file    Attends meetings of clubs or organizations: Not on file    Relationship status: Not on file  Other Topics Concern  . Not on file  Social History Narrative   Pt has h.s. degree   Past Surgical History:  Procedure Laterality Date  . ABDOMINAL HYSTERECTOMY  1999  . bunions removed  1988  . C4 C5 cage insertion  06/28/2013  . CHOLECYSTECTOMY  04/25/2008  . EDG  12/17/2004  . ELECTROCARDIOGRAM  05/27/2007  . L5 S1 rod insertion  11/07/2012  . SKIN CANCER EXCISION    . SKIN CANCER EXCISION     several  . TONSILLECTOMY  1981  . TUBAL LIGATION  1997   Past Medical History:  Diagnosis Date  . Anemia   . ANXIETY 06/10/2007  . ASYMPTOMATIC POSTMENOPAUSAL STATUS 07/19/2009    . Chronic headaches   . Cough 04/02/2009  . Degenerative arthritis of spine    back and neck  . Depression   . DYSLIPIDEMIA 06/28/2008  . Esophageal stricture   . Fibromyalgia   . Gallstones   . GERD 06/10/2007  . Hiatal hernia   . 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 HYPERGLYCEMIA 06/28/2008  . HYPERTENSION 06/10/2007  . Irritable bowel syndrome 06/28/2008  . OSTEOARTHRITIS 06/10/2007  . Pneumonia   . Skin cancer    basal cell  . Tubular adenoma of colon   . UPPER RESPIRATORY INFECTION 03/26/2009  . WEIGHT GAIN 01/21/2010   There were no vitals taken for this visit.  Opioid Risk Score:   Fall Risk Score:  `1  Depression screen PHQ 2/9  Depression screen [facility] 2/9 03/28/2018 01/25/2018 11/09/2017 10/08/2017 08/09/2017 04/28/2017 01/19/2017  Decreased Interest 3 3 1  - 1 1 0  Down, Depressed, Hopeless 3 2 1  - 1 1 1   PHQ - 2 Score 6 5 2  - 2 2 1   Altered sleeping - 0 - 1 - - -  Tired, decreased energy - 3 - 1 - - -  Change in appetite - 1 - - - - -  Feeling bad or failure about yourself  - 1 - - - - -  Trouble concentrating - 2 - - - - -  Moving slowly or fidgety/restless - 0 - - - - -  Suicidal thoughts - 0 - - - - -  PHQ-9 Score - 12 - - - - -  Some recent data might be hidden     Review of Systems  Constitutional: Positive for unexpected weight change.  HENT: Negative.   Eyes: Negative.   Respiratory: Negative.   Cardiovascular: Negative.   Gastrointestinal: Positive for abdominal pain.  Endocrine: Negative.   Genitourinary: Negative.   Musculoskeletal: Positive for arthralgias, back pain, gait problem, joint swelling and myalgias.  Skin: Negative.   Allergic/Immunologic: Negative.   Neurological: Positive for tremors and numbness.  Hematological: Bruises/bleeds easily.  Psychiatric/Behavioral: Positive for dysphoric mood. The patient is nervous/anxious.   All other systems reviewed and are negative.      Objective:   Physical Exam   General: No acute distress HEENT: EOMI, oral  membranes moist Cards: reg rate  Chest: normal effort Abdomen: Soft, NT, ND Skin: dry, intact Extremities: no edema Musculoskeletal: low back tender to palp. Left knee lateral joint line pain, minimal swelling  Neurological: memory and  cognition are at baseline. Strength symmetrical. Sensory is normal. DTR's 1+.  Skin: Skin is warm.  Psychiatric: affect bright and pleasant.    Assessment & Plan:  ASSESSMENT:  1. 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 s/p L4-5 L5-S1 decompression stabilization with substantial mprovement of her back and right leg pain.  2. Left knee meniscus injury.  3. Depression with anxiety. Much mproved with effexor and spiritual/social supports in place  4. Cervical spondylosis/Myofascial pain.  5. Concussion on 05/13/14 with post concussion syndrome- has ongoing memory and concentration deficits.  6. 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 OA right knee/meniscus---good results with June injection 8. Left 2nd MCP injury from late June---mild tendonitis---responsive to injections 9. Hematuria: ?source    PLAN:  1. Maintain ritalin 10mg  for attention/arousal---refilled #60.  Patient still seeing benefits with Ritalin for attention and energy.  Will continue for now.  Consider decreasing to 5 mg this fall 2.Left index finger stable to improved 3. Hydrocodone will continue for breakthrough pain 10/325 q6 prn #120 was refilled today.We will continue the controlled substance monitoring program, this consists of regular clinic visits, examinations, routine drug screening, pill counts as well as use of [place] Controlled Substance Reporting System. [place] today.   4. Continue with counseling with Dr. Sima Matas which has been extremely helpful. Involvement with church has helped too.  She is has seen the benefit of having other mechanisms to deal with stress and  pain in really seems to be flourishing now. 5. Elavil prn at bedtime.  6 .Follow up in about73monthswith NP. 49minutes of face to face patient care time were spent during this visit. All questions were encouraged and answered.

## 2018-04-27 NOTE — Patient Instructions (Signed)
PLEASE FEEL FREE TO CALL OUR OFFICE WITH ANY PROBLEMS OR QUESTIONS ([phone])      

## 2018-05-09 ENCOUNTER — Encounter (HOSPITAL_BASED_OUTPATIENT_CLINIC_OR_DEPARTMENT_OTHER): Payer: 59 | Admitting: Psychology

## 2018-05-09 DIAGNOSIS — F411 Generalized anxiety disorder: Secondary | ICD-10-CM

## 2018-05-09 DIAGNOSIS — F0781 Postconcussional syndrome: Secondary | ICD-10-CM

## 2018-05-09 DIAGNOSIS — G894 Chronic pain syndrome: Secondary | ICD-10-CM

## 2018-05-09 DIAGNOSIS — F4310 Post-traumatic stress disorder, unspecified: Secondary | ICD-10-CM

## 2018-05-09 DIAGNOSIS — M47817 Spondylosis without myelopathy or radiculopathy, lumbosacral region: Secondary | ICD-10-CM | POA: Diagnosis not present

## 2018-05-09 NOTE — Progress Notes (Signed)
Patient:                           [name]           DOB:                               1956-01-26  MR Number:                  [id number]  Location:                        [facility] [address] [place] [id number] [place] [address] Dept: [phone]  Start:  1 PM End:                                 2 PM  Provider/Observer:                           Edgardo Roys PSYD  Chief Complaint:                                   Chief Complaint  Patient presents with  . Post-Traumatic Stress Disorder  . Pain  . Depression  . Memory Loss  . Anxiety    Reason For Service:                         [name] is a 62 year old female referred by Dr. Naaman Plummer due to issues of coping and dealing with a number of stressors. She has a history of postconcussion syndrome, chronic pain syndrome as well as PTSD.  The patient has been dealing with significant anxiety and depression along with a history of PTSD and postconcussion syndrome. The patient was referred for psychotherapeutic interventions primarily because of the issues of depression and coping with family stress.  The above reason for service was reviewed and remains accurate.  The patient reports that there has been significant improvements in psychosocial issues but there continues to be a lot of stress particularly around issues related to her mother's dementia and the mother not recognizing the patient.  The above reason for service has been reviewed and is applicable to the current visit.  Interventions Strategy:                    Cognitive/behavioral psychotherapeutic interventions.    Participation Level:                           Active  Participation Quality:                       Appropriate and attentive                            Behavioral Observation:                  Well-groomed, alert, and  appropriate to the current visit.  Current Psychosocial Factors:        The patient reports that  she has been doing well overall.  She reports that she has been actively working on the therapeutic interventions we have developed.  The patient reports that she had a situation where she was forced to interact with 1 of her sisters and while it did not go well and the sister was manipulative and controlling and in fact had given away some of the patient's property that was stored at their mother's house the patient was able to handle it well and not have it for prolonged negative impact on her.  Content of Session:                           Reviewed current symptoms and continue to work on therapeutic issues around pain, PTSD depression/anxiety.  Current Status:                                   The patient reports that she has continued to work on her coping skills and strategies we have been developing and feels less anxious and less symptoms of depression.  The patient reports that she has had some anxiety when she realizes neighbors are outside and avoids going outside but we have been working on this situation and working on Therapist, occupational around these day-to-day situations.  Patient Progress:                               The patient continues to show improvement in her overall functioning.   Impression/Diagnosis:                     [name] is a 62 year old female referred by Dr. Naaman Plummer due to issues of coping and dealing with a number of stressors. She has a history of postconcussion syndrome, chronic pain syndrome as well as PTSD. The patient describes significant difficulty with issues related to her family and the family estate. There were issues when her father passed away where an older sister was in charge of everything and all of his possessions were been ago to this older sister. However, the middle sister began having trouble and when the patient could not help the sister  rejected the patient. There've been a number of family conflicts going on including a niece who got in trouble with doing drugs and her excuse to the patient's sister was that the patient was the one who started her doing drugs and giving her money. This is not accurate.  The above impression/diagnostic considerations continue to be accurate and valid for this appointment.   Diagnosis:   Chronic pain syndrome  PTSD (post-traumatic stress disorder)  Post concussion syndrome  GAD (generalized anxiety disorder)

## 2018-05-16 ENCOUNTER — Telehealth: Payer: Self-pay | Admitting: Registered Nurse

## 2018-05-16 DIAGNOSIS — M47817 Spondylosis without myelopathy or radiculopathy, lumbosacral region: Secondary | ICD-10-CM

## 2018-05-16 DIAGNOSIS — F0781 Postconcussional syndrome: Secondary | ICD-10-CM

## 2018-05-16 NOTE — Telephone Encounter (Signed)
Return [name] call, she reports she will be out of medication prior to her scheduled appointment. According to the medication list, post dated prescription were sent. Spoke with the pharmacy, her prescriptions are at the pharmacy. Placed a call to [name] she verbalizes understanding.

## 2018-05-31 ENCOUNTER — Encounter: Payer: Self-pay | Admitting: Registered Nurse

## 2018-05-31 ENCOUNTER — Encounter: Payer: 59 | Attending: Physical Medicine and Rehabilitation | Admitting: Registered Nurse

## 2018-05-31 VITALS — BP 131/82 | HR 97 | Ht 61.0 in | Wt 166.0 lb

## 2018-05-31 DIAGNOSIS — M461 Sacroiliitis, not elsewhere classified: Secondary | ICD-10-CM | POA: Diagnosis not present

## 2018-05-31 DIAGNOSIS — F411 Generalized anxiety disorder: Secondary | ICD-10-CM

## 2018-05-31 DIAGNOSIS — F3289 Other specified depressive episodes: Secondary | ICD-10-CM

## 2018-05-31 DIAGNOSIS — F4310 Post-traumatic stress disorder, unspecified: Secondary | ICD-10-CM

## 2018-05-31 DIAGNOSIS — M47817 Spondylosis without myelopathy or radiculopathy, lumbosacral region: Secondary | ICD-10-CM | POA: Diagnosis not present

## 2018-05-31 DIAGNOSIS — Z79891 Long term (current) use of opiate analgesic: Secondary | ICD-10-CM

## 2018-05-31 DIAGNOSIS — G894 Chronic pain syndrome: Secondary | ICD-10-CM

## 2018-05-31 DIAGNOSIS — F0781 Postconcussional syndrome: Secondary | ICD-10-CM | POA: Diagnosis not present

## 2018-05-31 DIAGNOSIS — M961 Postlaminectomy syndrome, not elsewhere classified: Secondary | ICD-10-CM

## 2018-05-31 DIAGNOSIS — Z5181 Encounter for therapeutic drug level monitoring: Secondary | ICD-10-CM

## 2018-05-31 MED ORDER — ALPRAZOLAM 0.5 MG PO TABS: 0.5000 mg | ORAL_TABLET | Freq: Three times a day (TID) | ORAL | 3 refills | Status: DC | PRN

## 2018-05-31 NOTE — Progress Notes (Signed)
Subjective:    Patient ID: [name], female    DOB: [birth year], 62 y.o.   MRN: [id number]  HPI: [name] is a 62 year old female who returns for follow up appointment and medication refill. She states her pain is located in her lower back. She rates her pain 8. Her current exercise regime is walking and gardening.   Ms. Annette Morphine Equivalent is 40.00 MME. She is also prescribed Alprazolam. We have discussed the black box warning of using opioids and benzodiazepines.  I highlighted the dangers of using these drugs together and discussed the adverse events including respiratory suppression, overdose, cognitive impairment and importance of compliance with current regimen. We will continue to monitor and adjust as indicated.   Last UDS was Performed on 03/01/2018, it was consistent.   Pain Inventory Average Pain 8 Pain Right Now 8 My pain is sharp, burning, dull, stabbing, tingling and aching  In the last 24 hours, has pain interfered with the following? General activity 10 Relation with others 10 Enjoyment of life 10 What TIME of day is your pain at its worst? na Sleep (in general) Fair  Pain is worse with: walking, bending, sitting, inactivity, standing, unsure and some activites Pain improves with: rest, heat/ice, pacing activities, medication and injections Relief from Meds: 8  Mobility use a cane ability to climb steps?  yes do you drive?  yes  Function not employed: date last employed 2010 disabled: date disabled 2015 I need assistance with the following:  dressing, meal prep, household duties and shopping  Neuro/Psych bladder control problems weakness numbness tremor tingling trouble walking spasms dizziness confusion depression anxiety loss of taste or smell  Prior Studies Any changes since last visit?  no  Physicians involved in your care Any changes since last visit?  no   Family History  Problem Relation Age of Onset  . Cervical  cancer Mother   . Diabetes Mother   . Stroke Mother   . Heart attack Father   . Stroke Father   . Colon cancer Father   . Anxiety disorder Maternal Aunt   . Depression Maternal Aunt   . Anxiety disorder Maternal Uncle   . Depression Maternal Uncle        suicide attempt by gun shot wound to head. Survived  . Alcohol abuse Other   . Drug abuse Other   . Breast cancer Maternal Grandmother   . Diabetes Maternal Grandmother   . Breast cancer Paternal Aunt   . Colon polyps Sister        x 2   Social History   Socioeconomic History  . Marital status: Married    Spouse name: Not on file  . Number of children: 0  . Years of education: Not on file  . Highest education level: Not on file  Occupational History  . Occupation: disabled  Social Needs  . Financial resource strain: Not on file  . Food insecurity:    Worry: Not on file    Inability: Not on file  . Transportation needs:    Medical: Not on file    Non-medical: Not on file  Tobacco Use  . Smoking status: Never Smoker  . Smokeless tobacco: Never Used  Substance and Sexual Activity  . Alcohol use: No    Alcohol/week: 0.0 standard drinks  . Drug use: No  . Sexual activity: Yes  Lifestyle  . Physical activity:    Days per week: Not on file    Minutes  per session: Not on file  . Stress: Not on file  Relationships  . Social connections:    Talks on phone: Not on file    Gets together: Not on file    Attends religious service: Not on file    Active member of club or organization: Not on file    Attends meetings of clubs or organizations: Not on file    Relationship status: Not on file  Other Topics Concern  . Not on file  Social History Narrative   Pt has h.s. degree   Past Surgical History:  Procedure Laterality Date  . ABDOMINAL HYSTERECTOMY  1999  . bunions removed  1988  . C4 C5 cage insertion  06/28/2013  . CHOLECYSTECTOMY  04/25/2008  . EDG  12/17/2004  . ELECTROCARDIOGRAM  05/27/2007  . L5 S1 rod  insertion  11/07/2012  . SKIN CANCER EXCISION    . SKIN CANCER EXCISION     several  . TONSILLECTOMY  1981  . TUBAL LIGATION  1997   Past Medical History:  Diagnosis Date  . Anemia   . ANXIETY 06/10/2007  . ASYMPTOMATIC POSTMENOPAUSAL STATUS 07/19/2009  . Chronic headaches   . Cough 04/02/2009  . Degenerative arthritis of spine    back and neck  . Depression   . DYSLIPIDEMIA 06/28/2008  . Esophageal stricture   . Fibromyalgia   . Gallstones   . GERD 06/10/2007  . Hiatal hernia   . 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 HYPERGLYCEMIA 06/28/2008  . HYPERTENSION 06/10/2007  . Irritable bowel syndrome 06/28/2008  . OSTEOARTHRITIS 06/10/2007  . Pneumonia   . Skin cancer    basal cell  . Tubular adenoma of colon   . UPPER RESPIRATORY INFECTION 03/26/2009  . WEIGHT GAIN 01/21/2010   There were no vitals taken for this visit.  Opioid Risk Score:   Fall Risk Score:  `1  Depression screen PHQ 2/9  Depression screen [facility] 2/9 03/28/2018 01/25/2018 11/09/2017 10/08/2017 08/09/2017 04/28/2017 01/19/2017  Decreased Interest 3 3 1  - 1 1 0  Down, Depressed, Hopeless 3 2 1  - 1 1 1   PHQ - 2 Score 6 5 2  - 2 2 1   Altered sleeping - 0 - 1 - - -  Tired, decreased energy - 3 - 1 - - -  Change in appetite - 1 - - - - -  Feeling bad or failure about yourself  - 1 - - - - -  Trouble concentrating - 2 - - - - -  Moving slowly or fidgety/restless - 0 - - - - -  Suicidal thoughts - 0 - - - - -  PHQ-9 Score - 12 - - - - -  Some recent data might be hidden     Review of Systems  Constitutional: Positive for appetite change and unexpected weight change.  HENT: Negative.   Eyes: Negative.   Respiratory: Positive for cough and shortness of breath.   Cardiovascular: Negative.   Gastrointestinal: Positive for abdominal pain.  Endocrine: Negative.   Genitourinary: Positive for dysuria.  Musculoskeletal: Positive for arthralgias, back pain, gait problem, joint swelling and myalgias.  Skin: Positive for rash.    Allergic/Immunologic: Negative.   Neurological: Positive for dizziness, weakness and numbness.  Hematological: Bruises/bleeds easily.  Psychiatric/Behavioral: Negative.   All other systems reviewed and are negative.      Objective:   Physical Exam  Constitutional: She is oriented to person, place, and time. She appears well-developed and well-nourished.  HENT:  Head: Normocephalic  and atraumatic.  Neck: Normal range of motion. Neck supple.  Cardiovascular: Normal rate and regular rhythm.  Pulmonary/Chest: Effort normal and breath sounds normal.  Musculoskeletal:  Normal Muscle Bulk and Muscle Testing Reveals: Upper Extremities: Full ROM and Muscle Strength 5/5 Lumbar Paraspinal Tenderness: L-3-L-5 Lower Extremities: Full ROM and Muscle Strength 5/5 Arises from Table with ease Narrow based Gait  Neurological: She is alert and oriented to person, place, and time.  Skin: Skin is warm and dry.  Psychiatric: She has a normal mood and affect. Her behavior is normal.  Nursing note and vitals reviewed.         Assessment & Plan:  1. History of chronic lumbar facet disease with spondylosis/DDD/ L4-5 spondylolisthesis.With L4-5 L5-S1 decompression stabilization: 05/31/2018 Continue: Hydrocodone 10/325mg  one tablet every 6 hours as needed #120.  We will continue the opioid monitoring program, this consists of regular clinic visits, examinations, urine drug screen, pill counts as well as use of [place] Controlled Substance Reporting System. 2. Depression with anxiety/ PTSD: Dr. Sima Matas Following. 05/31/2018. Continue Xanax, and Effexor. 3. Cervical spondylosis: Stable at this time with no complaints. Continue Medication Regime. 05/31/2018 4. 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 Continue Ritalin 10 mg one tablet twice a day 0700 and noon #60. Dr. Naaman Plummer note was reviewed, we discuss the slow titration in the fall, she  verbalizes understanding. 05/31/2018 5. Muscle Spasm: Continuecurrent medication regimen withFlexeril: May take 1-2 tablets at HS. 05/31/2018   F/U in 1 month

## 2018-06-02 ENCOUNTER — Encounter (HOSPITAL_BASED_OUTPATIENT_CLINIC_OR_DEPARTMENT_OTHER): Payer: 59 | Admitting: Psychology

## 2018-06-02 DIAGNOSIS — F0781 Postconcussional syndrome: Secondary | ICD-10-CM | POA: Diagnosis not present

## 2018-06-02 DIAGNOSIS — M47817 Spondylosis without myelopathy or radiculopathy, lumbosacral region: Secondary | ICD-10-CM

## 2018-06-02 DIAGNOSIS — F4310 Post-traumatic stress disorder, unspecified: Secondary | ICD-10-CM | POA: Diagnosis not present

## 2018-06-02 DIAGNOSIS — G894 Chronic pain syndrome: Secondary | ICD-10-CM

## 2018-06-02 DIAGNOSIS — F3289 Other specified depressive episodes: Secondary | ICD-10-CM

## 2018-06-02 DIAGNOSIS — F411 Generalized anxiety disorder: Secondary | ICD-10-CM

## 2018-06-03 ENCOUNTER — Encounter: Payer: Self-pay | Admitting: Psychology

## 2018-06-03 NOTE — Progress Notes (Signed)
Patient:                           [name]           DOB:                               01-23-1956  MR Number:                  [id number]  Location:                        [facility] [address], [place] [id number] [place] [address] Dept: [phone]  Start:  1 PM End:                                 2 PM  Provider/Observer:                           Edgardo Roys PSYD  Chief Complaint:                                   Chief Complaint  Patient presents with  . Post-Traumatic Stress Disorder  . Pain  . Depression  . Memory Loss  . Anxiety    Reason For Service:                         Anaih Brander is a 62 year old female referred by Dr. Naaman Plummer due to issues of coping and dealing with a number of stressors. She has a history of postconcussion syndrome, chronic pain syndrome as well as PTSD.  The patient has been dealing with significant anxiety and depression along with a history of PTSD and postconcussion syndrome. The patient was referred for psychotherapeutic interventions primarily because of the issues of depression and coping with family stress.  The above reason for service was reviewed and remains accurate.  The patient reports that there has been significant improvements in psychosocial issues but there continues to be a lot of stress particularly around issues related to her mother's dementia and the mother not recognizing the patient.  The above reason for service has been reviewed and is applicable for the current visit.  The patient is continuing to have psychosocial stressors that triggered her chronic pain and PTSD symptoms.  Interventions Strategy:                    Cognitive/behavioral psychotherapeutic interventions.      Participation Level:                           The patient was active to this event today and appropriate  interactions.  Participation Quality:                       Appropriate and attentive                          Behavioral Observation:  Patient is well-groomed, alert and appropriate in her interactions  Current Psychosocial Factors:        Patient reports that she continues to have some complex difficulties with.  The patient has some of her possessions at 1 of her sister's house as well as items that have been given to her by her father who is now deceased or her mother who is suffering from dementia.  The other sister is keeping her from these objects and some of them have actually been sold by her sister-in-law's husband.  The patient reports that she is trying to stay away from her family members unless absolutely necessary but some of these issues are continuing to exacerbate her stress.  Content of Session:                           Reviewed current symptoms and continue to work on therapeutic issues around pain, PTSD, and depression/anxiety.  Current Status:                                   The patient reports that she continues to work on her coping skills and strategies around reduction of anxiety and depression symptoms.  The patient reports that other than the exacerbation when she has to interact with her siblings she is doing better overall.  Patient Progress:                               The patient continues to show improvement in her overall functioning.  Impression/Diagnosis:                     [name] is a 62 year old female referred by Dr. Naaman Plummer due to issues of coping and dealing with a number of stressors. She has a history of postconcussion syndrome, chronic pain syndrome as well as PTSD. The patient describes significant difficulty with issues related to her family and the family estate. There were issues when her father passed away where an older sister was in charge of everything and all of his possessions were been ago to this older sister.  However, the middle sister began having trouble and when the patient could not help the sister rejected the patient. There've been a number of family conflicts going on including a niece who got in trouble with doing drugs and her excuse to the patient's sister was that the patient was the one who started her doing drugs and giving her money. This is not accurate.  The above impression/diagnostic considerations have been reviewed and remains applicable and accurate for the current visit.   Diagnosis:   Lumbosacral spondylosis without myelopathy  Post concussion syndrome  PTSD (post-traumatic stress disorder)  GAD (generalized anxiety disorder)  Chronic pain syndrome  Other depression

## 2018-06-19 ENCOUNTER — Other Ambulatory Visit: Payer: Self-pay | Admitting: Registered Nurse

## 2018-06-28 ENCOUNTER — Ambulatory Visit (HOSPITAL_COMMUNITY)
Admission: EM | Admit: 2018-06-28 | Discharge: 2018-06-28 | Disposition: A | Discharge status: Home / Self Care | Payer: 59 | Attending: Family Medicine | Admitting: Family Medicine

## 2018-06-28 ENCOUNTER — Encounter: Payer: 59 | Attending: Physical Medicine and Rehabilitation | Admitting: Registered Nurse

## 2018-06-28 ENCOUNTER — Encounter (HOSPITAL_COMMUNITY): Payer: Self-pay | Admitting: Emergency Medicine

## 2018-06-28 ENCOUNTER — Other Ambulatory Visit: Payer: Self-pay

## 2018-06-28 ENCOUNTER — Encounter: Payer: Self-pay | Admitting: Endocrinology

## 2018-06-28 ENCOUNTER — Encounter: Payer: Self-pay | Admitting: Registered Nurse

## 2018-06-28 VITALS — BP 126/79 | HR 97 | Resp 14 | Ht 61.0 in | Wt 165.0 lb

## 2018-06-28 DIAGNOSIS — M961 Postlaminectomy syndrome, not elsewhere classified: Secondary | ICD-10-CM

## 2018-06-28 DIAGNOSIS — F4310 Post-traumatic stress disorder, unspecified: Secondary | ICD-10-CM | POA: Diagnosis not present

## 2018-06-28 DIAGNOSIS — F411 Generalized anxiety disorder: Secondary | ICD-10-CM | POA: Insufficient documentation

## 2018-06-28 DIAGNOSIS — H60331 Swimmer's ear, right ear: Secondary | ICD-10-CM | POA: Diagnosis not present

## 2018-06-28 DIAGNOSIS — Z5181 Encounter for therapeutic drug level monitoring: Secondary | ICD-10-CM

## 2018-06-28 DIAGNOSIS — F3289 Other specified depressive episodes: Secondary | ICD-10-CM

## 2018-06-28 DIAGNOSIS — M47817 Spondylosis without myelopathy or radiculopathy, lumbosacral region: Secondary | ICD-10-CM | POA: Diagnosis not present

## 2018-06-28 DIAGNOSIS — G894 Chronic pain syndrome: Secondary | ICD-10-CM

## 2018-06-28 DIAGNOSIS — F0781 Postconcussional syndrome: Secondary | ICD-10-CM

## 2018-06-28 DIAGNOSIS — M461 Sacroiliitis, not elsewhere classified: Secondary | ICD-10-CM | POA: Diagnosis not present

## 2018-06-28 DIAGNOSIS — M62838 Other muscle spasm: Secondary | ICD-10-CM

## 2018-06-28 DIAGNOSIS — Z79891 Long term (current) use of opiate analgesic: Secondary | ICD-10-CM

## 2018-06-28 MED ORDER — HYDROCODONE-ACETAMINOPHEN 10-325 MG PO TABS: 1.0000 | ORAL_TABLET | Freq: Four times a day (QID) | ORAL | 0 refills | Status: DC | PRN

## 2018-06-28 MED ORDER — NEOMYCIN-POLYMYXIN-HC 3.5-10000-1 OT SUSP: 3.0000 [drp] | Freq: Three times a day (TID) | OTIC | 0 refills | Status: AC

## 2018-06-28 MED ORDER — METHYLPHENIDATE HCL 10 MG PO TABS: 10.0000 mg | ORAL_TABLET | Freq: Two times a day (BID) | ORAL | 0 refills | Status: DC

## 2018-06-28 NOTE — ED Triage Notes (Signed)
Right ear pain for 2 days.  Tender to touch below ear and say there is a bloody, brownish drainage from right ear.  Patient denies placing anything in ear

## 2018-06-28 NOTE — Discharge Instructions (Signed)
Please use cortisporin ear drops 3 drops in right ear every 8 hours Tylenol and Ibuprofen for pain  Follow up if not improving, worsening or developing new symptoms

## 2018-06-28 NOTE — Progress Notes (Signed)
Subjective:    Patient ID: [name], female    DOB: 08/28/1956, 62 y.o.   MRN: [id number]  HPI: [name] is a 62 year old female who returns for follow up appointment for chronic pain and medication refill. She states her pain is located in her lower back and right hip. She rates her pain 8. Her current exercise regime is walking.   [name] reports she has a ear infection, she called her PCP, they were unable to see her she states  And she's going to Urgent Care today.   [name] Morphine Equivalent is 40.00 MME.She is also prescribed Alprazolam. We have discussed the black box warning of using opioids and benzodiazepines. I highlighted the dangers of using these drugs together and discussed the adverse events including respiratory suppression, overdose, cognitive impairment and importance of compliance with current regimen. We will continue to monitor and adjust as indicated.    Last UDS was Performed on 03/01/2018, it was consistent.    Pain Inventory Average Pain 8 Pain Right Now 8 My pain is sharp, burning, dull, stabbing, tingling and aching  In the last 24 hours, has pain interfered with the following? General activity 10 Relation with others 10 Enjoyment of life 10 What TIME of day is your pain at its worst? all Sleep (in general) Fair  Pain is worse with: walking, bending, sitting, inactivity, standing and some activites Pain improves with: rest, heat/ice, pacing activities, medication and injections Relief from Meds: 8  Mobility walk without assistance walk with assistance use a cane ability to climb steps?  yes do you drive?  yes Do you have any goals in this area?  yes  Function disabled: date disabled . I need assistance with the following:  dressing, meal prep, household duties and shopping Do you have any goals in this area?  yes  Neuro/Psych bladder control  problems weakness numbness tremor tingling spasms dizziness confusion depression anxiety  Prior Studies Any changes since last visit?  no  Physicians involved in your care Any changes since last visit?  no   Family History  Problem Relation Age of Onset  . Cervical cancer Mother   . Diabetes Mother   . Stroke Mother   . Heart attack Father   . Stroke Father   . Colon cancer Father   . Anxiety disorder Maternal Aunt   . Depression Maternal Aunt   . Anxiety disorder Maternal Uncle   . Depression Maternal Uncle        suicide attempt by gun shot wound to head. Survived  . Alcohol abuse Other   . Drug abuse Other   . Breast cancer Maternal Grandmother   . Diabetes Maternal Grandmother   . Breast cancer Paternal Aunt   . Colon polyps Sister        x 2   Social History   Socioeconomic History  . Marital status: Married    Spouse name: Not on file  . Number of children: 0  . Years of education: Not on file  . Highest education level: Not on file  Occupational History  . Occupation: disabled  Social Needs  . Financial resource strain: Not on file  . Food insecurity:    Worry: Not on file    Inability: Not on file  . Transportation needs:    Medical: Not on file    Non-medical: Not on file  Tobacco Use  . Smoking status: Never Smoker  . Smokeless tobacco: Never Used  Substance and Sexual Activity  . Alcohol use: No    Alcohol/week: 0.0 standard drinks  . Drug use: No  . Sexual activity: Yes  Lifestyle  . Physical activity:    Days per week: Not on file    Minutes per session: Not on file  . Stress: Not on file  Relationships  . Social connections:    Talks on phone: Not on file    Gets together: Not on file    Attends religious service: Not on file    Active member of club or organization: Not on file    Attends meetings of clubs or organizations: Not on file    Relationship status: Not on file  Other Topics Concern  . Not on file  Social History  Narrative   Pt has h.s. degree   Past Surgical History:  Procedure Laterality Date  . ABDOMINAL HYSTERECTOMY  1999  . bunions removed  1988  . C4 C5 cage insertion  06/28/2013  . CHOLECYSTECTOMY  04/25/2008  . EDG  12/17/2004  . ELECTROCARDIOGRAM  05/27/2007  . L5 S1 rod insertion  11/07/2012  . SKIN CANCER EXCISION    . SKIN CANCER EXCISION     several  . TONSILLECTOMY  1981  . TUBAL LIGATION  1997   Past Medical History:  Diagnosis Date  . Anemia   . ANXIETY 06/10/2007  . ASYMPTOMATIC POSTMENOPAUSAL STATUS 07/19/2009  . Chronic headaches   . Cough 04/02/2009  . Degenerative arthritis of spine    back and neck  . Depression   . DYSLIPIDEMIA 06/28/2008  . Esophageal stricture   . Fibromyalgia   . Gallstones   . GERD 06/10/2007  . Hiatal hernia   . 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 HYPERGLYCEMIA 06/28/2008  . HYPERTENSION 06/10/2007  . Irritable bowel syndrome 06/28/2008  . OSTEOARTHRITIS 06/10/2007  . Pneumonia   . Skin cancer    basal cell  . Tubular adenoma of colon   . UPPER RESPIRATORY INFECTION 03/26/2009  . WEIGHT GAIN 01/21/2010   Ht 5\' 1"  (1.549 m)   Wt 165 lb (74.8 kg)   BMI 31.18 kg/m   Opioid Risk Score:   Fall Risk Score:  `1  Depression screen PHQ 2/9  Depression screen [facility] 2/9 03/28/2018 01/25/2018 11/09/2017 10/08/2017 08/09/2017 04/28/2017 01/19/2017  Decreased Interest 3 3 1  - 1 1 0  Down, Depressed, Hopeless 3 2 1  - 1 1 1   PHQ - 2 Score 6 5 2  - 2 2 1   Altered sleeping - 0 - 1 - - -  Tired, decreased energy - 3 - 1 - - -  Change in appetite - 1 - - - - -  Feeling bad or failure about yourself  - 1 - - - - -  Trouble concentrating - 2 - - - - -  Moving slowly or fidgety/restless - 0 - - - - -  Suicidal thoughts - 0 - - - - -  PHQ-9 Score - 12 - - - - -  Some recent data might be hidden    Review of Systems  Constitutional: Positive for appetite change, chills, fever and unexpected weight change.  Eyes: Negative.   Respiratory: Negative.   Cardiovascular: Negative.    Endocrine: Negative.   Genitourinary: Positive for difficulty urinating.  Musculoskeletal: Positive for arthralgias, back pain, myalgias and neck pain.       Spasms   Neurological: Positive for dizziness, tremors, weakness and numbness.       Tingling  Psychiatric/Behavioral: Positive for confusion and dysphoric mood. The patient is nervous/anxious.   All other systems reviewed and are negative.      Objective:   Physical Exam  Constitutional: She is oriented to person, place, and time. She appears well-developed and well-nourished.  HENT:  Head: Normocephalic and atraumatic.  Neck: Normal range of motion. Neck supple.  Cardiovascular: Normal rate and regular rhythm.  Pulmonary/Chest: Effort normal and breath sounds normal.  Musculoskeletal:  Normal Muscle Bulk and Muscle Testing Reveals: Upper Extremities: Full ROM and Muscle Strength 5/5 Lumbar Paraspinal Tenderness: L-4-L-5 Lower Extremities: Full ROM and Muscle Strength 5/5 Arises from chair with ease Narrow Based gait  Neurological: She is alert and oriented to person, place, and time.  Skin: Skin is warm and dry.  Psychiatric: She has a normal mood and affect. Her behavior is normal.  Nursing note and vitals reviewed.         Assessment & Plan:  1. History of chronic lumbar facet disease with spondylosis/DDD/ L4-5 spondylolisthesis.With L4-5 L5-S1 decompression stabilization: 06/28/2018 Continue: Hydrocodone 10/325mg  one tablet every 6 hours as needed #120.  We will continue the opioid monitoring program, this consists of regular clinic visits, examinations, urine drug screen, pill counts as well as use of [place] Controlled Substance Reporting System. 2. Depression with anxiety/ PTSD: Dr. Sima Matas Following. 06/28/2018. Continue Xanax, and Effexor. 3. Cervical spondylosis: Stable at this time with no complaints. Continue Medication Regime. 06/28/2018 4. Post Concussion Syndrome 06/28/2014 : Has  ongoing memory and concentration deficits. Continue Ritalin 10 mg one tablet twice a day 0700 and noon #60. Dr. Naaman Plummer note was reviewed, we discuss the slow titration in the fall, she verbalizes understanding. 06/28/2018 5. Muscle Spasm: Continuecurrent medication regimen withFlexeril: May take 1-2 tablets at HS. 09/17/20196. Earache/ Ear Infection?: Going to Urgent Care she reports.   20 minutes of face to face patient care time was spent during this visit. All questions were encouraged and answered.  F/U in 1 month

## 2018-06-28 NOTE — ED Provider Notes (Signed)
[name]    CSN: [id number] Arrival date & time: 06/28/18  1535     History   Chief Complaint Chief Complaint  Patient presents with  . Otalgia    HPI [name] is a 62 y.o. female history of hypertension, hyperlipidemia, osteoporosis presenting today for evaluation of right ear pain.  Patient states that she has had this pain off and on for the past 2 weeks, it has progressively worsened over the past few days.  She has also noticed some bloody brownish discharge from the ear.  She has tried over-the-counter drops as well as a wax candle without relief.  Denies fevers.  Denies associated URI symptoms.  HPI  Past Medical History:  Diagnosis Date  . Anemia   . ANXIETY 06/10/2007  . ASYMPTOMATIC POSTMENOPAUSAL STATUS 07/19/2009  . Chronic headaches   . Cough 04/02/2009  . Degenerative arthritis of spine    back and neck  . Depression   . DYSLIPIDEMIA 06/28/2008  . Esophageal stricture   . Fibromyalgia   . Gallstones   . GERD 06/10/2007  . Hiatal hernia   . 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 HYPERGLYCEMIA 06/28/2008  . HYPERTENSION 06/10/2007  . Irritable bowel syndrome 06/28/2008  . OSTEOARTHRITIS 06/10/2007  . Pneumonia   . Skin cancer    basal cell  . Tubular adenoma of colon   . UPPER RESPIRATORY INFECTION 03/26/2009  . WEIGHT GAIN 01/21/2010    Patient Active Problem List   Diagnosis Date Noted  . Chronic pain syndrome 04/27/2018  . Depression 04/27/2018  . Hematuria 08/21/2016  . Primary osteoarthritis of right knee 03/31/2016  . Iron deficiency anemia 08/01/2015  . Benign paroxysmal positional vertigo 08/01/2015  . Hypokalemia 06/25/2015  . Wellness examination 06/25/2015  . Chest pain 06/13/2015  . Palpitations 11/15/2014  . Osteoporosis 08/17/2014  . Dyspnea 07/30/2014  . Panic disorder without agoraphobia 07/18/2014  . PTSD (post-traumatic stress disorder) 03/13/2014  . MDD (major depressive disorder) 03/13/2014  . GAD (generalized anxiety disorder)  03/13/2014  . Cervical spondylosis without myelopathy 12/02/2012  . Lumbosacral spondylosis without myelopathy 06/07/2012  . Post concussion syndrome 06/07/2012  . Lumbar facet arthropathy 02/05/2012  . Hypothyroidism 08/05/2011  . Encounter for long-term (current) use of other medications 08/04/2011  . CONTACT DERMATITIS 07/04/2009  . 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 Hyperlipidemia 06/28/2008  . Irritable bowel syndrome 06/28/2008  . Hyperglycemia 06/28/2008  . Essential hypertension 06/10/2007  . GERD 06/10/2007  . OSTEOARTHRITIS 06/10/2007    Past Surgical History:  Procedure Laterality Date  . ABDOMINAL HYSTERECTOMY  1999  . bunions removed  1988  . C4 C5 cage insertion  06/28/2013  . CHOLECYSTECTOMY  04/25/2008  . EDG  12/17/2004  . ELECTROCARDIOGRAM  05/27/2007  . L5 S1 rod insertion  11/07/2012  . SKIN CANCER EXCISION    . SKIN CANCER EXCISION     several  . TONSILLECTOMY  1981  . TUBAL LIGATION  1997    OB History   None      Home Medications    Prior to Admission medications   Medication Sig Start Date End Date Taking? Authorizing Provider  acyclovir (ZOVIRAX) 800 MG tablet Take 1 tablet (800 mg total) by mouth daily. 12/22/17   Renato Shin, MD  ALPRAZolam Duanne Moron) 0.5 MG tablet Take 1 tablet (0.5 mg total) by mouth 3 (three) times daily as needed for anxiety. 05/31/18   Bayard Hugger, NP  cyclobenzaprine (FLEXERIL) 5 MG tablet TAKE 1 TABLET AT BEDTIME AS NEEDED  FOR MUSCLE SPASM MAY TAKE AN EXTRA TABLET AS NEEDED 06/20/18   Bayard Hugger, NP  HYDROcodone-acetaminophen (NORCO) 10-325 MG tablet Take 1 tablet by mouth every 6 (six) hours as needed. 06/28/18   Bayard Hugger, NP  irbesartan-hydrochlorothiazide (AVALIDE) 150-12.5 MG tablet Take 0.5 tablets by mouth daily. 12/15/17   Renato Shin, MD  meclizine (ANTIVERT) 12.5 MG tablet TAKE 1 TABLET (12.5 MG TOTAL) BY MOUTH 3 (THREE) TIMES DAILY AS NEEDED. 10/23/13   Meredith Staggers, MD  methylphenidate (RITALIN) 10 MG tablet Take  1 tablet (10 mg total) by mouth 2 (two) times daily with breakfast and lunch. 6979,[id number] 06/28/18   Bayard Hugger, NP  neomycin-polymyxin-hydrocortisone (CORTISPORIN) 3.5-10000-1 OTIC suspension Place 3 drops into the right ear 3 (three) times daily for 7 days. 06/28/18 07/05/18  Yamila Cragin C, PA-C  ranitidine (ZANTAC) 150 MG capsule Take 1 capsule (150 mg total) by mouth every evening. 01/01/16   Pyrtle, Lajuan Lines, MD  venlafaxine XR (EFFEXOR-XR) 150 MG 24 hr capsule TAKE 1 CAPSULE DAILY WITH BREAKFAST 03/14/18   Meredith Staggers, MD    Family History Family History  Problem Relation Age of Onset  . Cervical cancer Mother   . Diabetes Mother   . Stroke Mother   . Heart attack Father   . Stroke Father   . Colon cancer Father   . Anxiety disorder Maternal Aunt   . Depression Maternal Aunt   . Anxiety disorder Maternal Uncle   . Depression Maternal Uncle        suicide attempt by gun shot wound to head. Survived  . Alcohol abuse Other   . Drug abuse Other   . Breast cancer Maternal Grandmother   . Diabetes Maternal Grandmother   . Breast cancer Paternal Aunt   . Colon polyps Sister        x 2    Social History Social History   Tobacco Use  . Smoking status: Never Smoker  . Smokeless tobacco: Never Used  Substance Use Topics  . Alcohol use: No    Alcohol/week: 0.0 standard drinks  . Drug use: No     Allergies   Gabapentin; Poison ivy extract [poison ivy extract]; Poison oak extract [poison oak extract]; and Poison sumac extract   Review of Systems Review of Systems  Constitutional: Negative for activity change, appetite change, chills, fatigue and fever.  HENT: Positive for ear discharge and ear pain. Negative for congestion, rhinorrhea, sore throat and trouble swallowing.   Eyes: Negative for discharge and redness.  Respiratory: Negative for cough, chest tightness and shortness of breath.   Cardiovascular: Negative for chest pain.  Gastrointestinal: Negative for  abdominal pain, diarrhea, nausea and vomiting.  Musculoskeletal: Negative for myalgias.  Skin: Negative for rash.  Neurological: Negative for dizziness, light-headedness and headaches.     Physical Exam Triage Vital Signs ED Triage Vitals  Enc Vitals Group     BP 06/28/18 1614 121/66     Pulse Rate 06/28/18 1614 91     Resp 06/28/18 1614 18     Temp 06/28/18 1614 98.1 F (36.7 C)     Temp Source 06/28/18 1614 Oral     SpO2 06/28/18 1614 100 %     Weight --      Height --      Head Circumference --      Peak Flow --      Pain Score 06/28/18 1612 8     Pain Loc --  Pain Edu? --      Excl. in [place]? --    No data found.  Updated Vital Signs BP 121/66 (BP Location: Left Arm)   Pulse 91   Temp 98.1 F (36.7 C) (Oral)   Resp 18   SpO2 100%   Visual Acuity Right Eye Distance:   Left Eye Distance:   Bilateral Distance:    Right Eye Near:   Left Eye Near:    Bilateral Near:     Physical Exam  Constitutional: She appears well-developed and well-nourished. No distress.  HENT:  Head: Normocephalic and atraumatic.  Mouth/Throat: Oropharynx is clear and moist.  Right EAC edematous with debris, TM largely visualized, no erythema  Left EAC and TM without erythema or swelling  Eyes: Conjunctivae are normal.  Neck: Neck supple.  Cardiovascular: Normal rate and regular rhythm.  No murmur heard. Pulmonary/Chest: Effort normal and breath sounds normal. No respiratory distress.  Abdominal: Soft.  Musculoskeletal: She exhibits no edema.  Neurological: She is alert.  Skin: Skin is warm and dry.  Psychiatric: She has a normal mood and affect.  Nursing note and vitals reviewed.    UC Treatments / Results  Labs (all labs ordered are listed, but only abnormal results are displayed) Labs Reviewed - No data to display  EKG None  Radiology No results found.  Procedures Procedures (including critical care time)  Medications Ordered in UC Medications - No data to  display  Initial Impression / Assessment and Plan / UC Course  I have reviewed the triage vital signs and the nursing notes.  Pertinent labs & imaging results that were available during my care of the patient were reviewed by me and considered in my medical decision making (see chart for details).     Right otitis media, will treat with Cortisporin eardrops, Tylenol and ibuprofen for pain.  Monitor symptoms, Discussed strict return precautions. Patient verbalized understanding and is agreeable with plan.  Final Clinical Impressions(s) / UC Diagnoses   Final diagnoses:  Acute swimmer's ear of right side     Discharge Instructions     Please use cortisporin ear drops 3 drops in right ear every 8 hours Tylenol and Ibuprofen for pain  Follow up if not improving, worsening or developing new symptoms   ED Prescriptions    Medication Sig Dispense Auth. Provider   neomycin-polymyxin-hydrocortisone (CORTISPORIN) 3.5-10000-1 OTIC suspension Place 3 drops into the right ear 3 (three) times daily for 7 days. 10 mL Lorenna Lurry C, PA-C     Controlled Substance Prescriptions [place] Controlled Substance Registry consulted? Not Applicable   Janith Lima, [place] 06/28/18 1638

## 2018-06-30 ENCOUNTER — Encounter: Payer: 59 | Admitting: Psychology

## 2018-07-01 ENCOUNTER — Telehealth: Payer: Self-pay | Admitting: Endocrinology

## 2018-07-01 NOTE — Telephone Encounter (Signed)
Please advise 

## 2018-07-01 NOTE — Telephone Encounter (Signed)
Per Pt Schedule request "Have had a bad ear infection for 2 weeks! Tried every earwash on [employer], tried candles and used husband's Ciprodex drops and it's getting worse. Ear drains on pillow at night. It has started hurting so bad and now it's bleeding! I do not use Qtips, nor do I put objects in my ear. PLEASE HELP!!"  FYI, I called patient and left a message on voice mail for her to return call to schedule an appointment.

## 2018-07-11 ENCOUNTER — Encounter: Payer: Self-pay | Admitting: Endocrinology

## 2018-07-27 ENCOUNTER — Encounter: Payer: Self-pay | Admitting: Registered Nurse

## 2018-07-27 ENCOUNTER — Encounter: Payer: 59 | Attending: Physical Medicine and Rehabilitation | Admitting: Registered Nurse

## 2018-07-27 VITALS — BP 126/65 | HR 92 | Resp 14 | Ht 61.0 in | Wt 163.0 lb

## 2018-07-27 DIAGNOSIS — M47817 Spondylosis without myelopathy or radiculopathy, lumbosacral region: Secondary | ICD-10-CM

## 2018-07-27 DIAGNOSIS — G894 Chronic pain syndrome: Secondary | ICD-10-CM

## 2018-07-27 DIAGNOSIS — M961 Postlaminectomy syndrome, not elsewhere classified: Secondary | ICD-10-CM

## 2018-07-27 DIAGNOSIS — M461 Sacroiliitis, not elsewhere classified: Secondary | ICD-10-CM | POA: Insufficient documentation

## 2018-07-27 DIAGNOSIS — F4310 Post-traumatic stress disorder, unspecified: Secondary | ICD-10-CM

## 2018-07-27 DIAGNOSIS — F0781 Postconcussional syndrome: Secondary | ICD-10-CM

## 2018-07-27 DIAGNOSIS — Z5181 Encounter for therapeutic drug level monitoring: Secondary | ICD-10-CM

## 2018-07-27 DIAGNOSIS — F3289 Other specified depressive episodes: Secondary | ICD-10-CM

## 2018-07-27 DIAGNOSIS — Z79891 Long term (current) use of opiate analgesic: Secondary | ICD-10-CM

## 2018-07-27 DIAGNOSIS — M62838 Other muscle spasm: Secondary | ICD-10-CM

## 2018-07-27 DIAGNOSIS — F411 Generalized anxiety disorder: Secondary | ICD-10-CM

## 2018-07-27 MED ORDER — HYDROCODONE-ACETAMINOPHEN 10-325 MG PO TABS: 1.0000 | ORAL_TABLET | Freq: Four times a day (QID) | ORAL | 0 refills | Status: DC | PRN

## 2018-07-27 MED ORDER — METHYLPHENIDATE HCL 10 MG PO TABS: 10.0000 mg | ORAL_TABLET | Freq: Two times a day (BID) | ORAL | 0 refills | Status: DC

## 2018-07-27 NOTE — Progress Notes (Addendum)
Subjective:    Patient ID: [name], female    DOB: 03-02-1956, 62 y.o.   MRN: [id number]  HPI: [name] is a 62 year old female who returns for follow up appointment for chronic pain and medication refill. She states her pain is located in her mid-lower back  and occasionally radiates into her left hip and left lower extremity. She rates her pain 8. Her current exercise regime is walking and walking on treadmill two days a week for 30 minutes.   Ms. Hadaway Morphine Equivalent is 40.00 MME. She is also prescribed Alprazolam. .We have discussed the black box warning of using opioids and benzodiazepines. I highlighted the dangers of using these drugs together and discussed the adverse events including respiratory suppression, overdose, cognitive impairment and importance of compliance with current regimen. We will continue to monitor and adjust as indicated.   Last UDS was Performed on 03/01/2018, it was consistent.   Pain Inventory Average Pain 8 Pain Right Now 8 My pain is sharp, burning, dull, stabbing, tingling and aching  In the last 24 hours, has pain interfered with the following? General activity 10 Relation with others 10 Enjoyment of life 10 What TIME of day is your pain at its worst? all Sleep (in general) Fair  Pain is worse with: walking, bending, sitting, inactivity, standing and some activites Pain improves with: rest, heat/ice, pacing activities, medication and injections Relief from Meds: 8  Mobility walk without assistance walk with assistance use a cane ability to climb steps?  yes do you drive?  yes Do you have any goals in this area?  yes  Function not employed: date last employed . disabled: date disabled . I need assistance with the following:  dressing, meal prep, household duties and shopping Do you have any goals in this area?  yes  Neuro/Psych bladder control problems weakness numbness tremor tingling trouble  walking spasms dizziness confusion depression anxiety loss of taste or smell  Prior Studies Any changes since last visit?  no  Physicians involved in your care Any changes since last visit?  no   Family History  Problem Relation Age of Onset  . Cervical cancer Mother   . Diabetes Mother   . Stroke Mother   . Heart attack Father   . Stroke Father   . Colon cancer Father   . Anxiety disorder Maternal Aunt   . Depression Maternal Aunt   . Anxiety disorder Maternal Uncle   . Depression Maternal Uncle        suicide attempt by gun shot wound to head. Survived  . Alcohol abuse Other   . Drug abuse Other   . Breast cancer Maternal Grandmother   . Diabetes Maternal Grandmother   . Breast cancer Paternal Aunt   . Colon polyps Sister        x 2   Social History   Socioeconomic History  . Marital status: Married    Spouse name: Not on file  . Number of children: 0  . Years of education: Not on file  . Highest education level: Not on file  Occupational History  . Occupation: disabled  Social Needs  . Financial resource strain: Not on file  . Food insecurity:    Worry: Not on file    Inability: Not on file  . Transportation needs:    Medical: Not on file    Non-medical: Not on file  Tobacco Use  . Smoking status: Never Smoker  . Smokeless tobacco:  Never Used  Substance and Sexual Activity  . Alcohol use: No    Alcohol/week: 0.0 standard drinks  . Drug use: No  . Sexual activity: Yes  Lifestyle  . Physical activity:    Days per week: Not on file    Minutes per session: Not on file  . Stress: Not on file  Relationships  . Social connections:    Talks on phone: Not on file    Gets together: Not on file    Attends religious service: Not on file    Active member of club or organization: Not on file    Attends meetings of clubs or organizations: Not on file    Relationship status: Not on file  Other Topics Concern  . Not on file  Social History Narrative    Pt has h.s. degree   Past Surgical History:  Procedure Laterality Date  . ABDOMINAL HYSTERECTOMY  1999  . bunions removed  1988  . C4 C5 cage insertion  06/28/2013  . CHOLECYSTECTOMY  04/25/2008  . EDG  12/17/2004  . ELECTROCARDIOGRAM  05/27/2007  . L5 S1 rod insertion  11/07/2012  . SKIN CANCER EXCISION    . SKIN CANCER EXCISION     several  . TONSILLECTOMY  1981  . TUBAL LIGATION  1997   Past Medical History:  Diagnosis Date  . Anemia   . ANXIETY 06/10/2007  . ASYMPTOMATIC POSTMENOPAUSAL STATUS 07/19/2009  . Chronic headaches   . Cough 04/02/2009  . Degenerative arthritis of spine    back and neck  . Depression   . DYSLIPIDEMIA 06/28/2008  . Esophageal stricture   . Fibromyalgia   . Gallstones   . GERD 06/10/2007  . Hiatal hernia   . 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 HYPERGLYCEMIA 06/28/2008  . HYPERTENSION 06/10/2007  . Irritable bowel syndrome 06/28/2008  . OSTEOARTHRITIS 06/10/2007  . Pneumonia   . Skin cancer    basal cell  . Tubular adenoma of colon   . UPPER RESPIRATORY INFECTION 03/26/2009  . WEIGHT GAIN 01/21/2010   BP 126/65   Pulse 92   Resp 14   Ht 5\' 1"  (1.549 m)   Wt 163 lb (73.9 kg)   SpO2 94%   BMI 30.80 kg/m   Opioid Risk Score:   Fall Risk Score:  `1  Depression screen PHQ 2/9  Depression screen [facility] 2/9 03/28/2018 01/25/2018 11/09/2017 10/08/2017 08/09/2017 04/28/2017 01/19/2017  Decreased Interest 3 3 1  - 1 1 0  Down, Depressed, Hopeless 3 2 1  - 1 1 1   PHQ - 2 Score 6 5 2  - 2 2 1   Altered sleeping - 0 - 1 - - -  Tired, decreased energy - 3 - 1 - - -  Change in appetite - 1 - - - - -  Feeling bad or failure about yourself  - 1 - - - - -  Trouble concentrating - 2 - - - - -  Moving slowly or fidgety/restless - 0 - - - - -  Suicidal thoughts - 0 - - - - -  PHQ-9 Score - 12 - - - - -  Some recent data might be hidden    Review of Systems  Constitutional: Positive for appetite change and unexpected weight change.  HENT: Negative.   Eyes: Negative.   Respiratory:  Positive for cough and shortness of breath.   Cardiovascular: Positive for leg swelling.  Gastrointestinal: Positive for abdominal pain and nausea.  Endocrine: Negative.   Genitourinary: Positive for difficulty  urinating.  Musculoskeletal: Positive for arthralgias, back pain, gait problem, myalgias and neck pain.       Spasms   Skin: Positive for rash.  Neurological: Positive for dizziness, tremors, weakness and numbness.       Tingling  Hematological: Bruises/bleeds easily.  Psychiatric/Behavioral: Positive for confusion and dysphoric mood. The patient is nervous/anxious.        Objective:   Physical Exam  Constitutional: She is oriented to person, place, and time. She appears well-developed and well-nourished.  HENT:  Head: Normocephalic and atraumatic.  Neck: Normal range of motion. Neck supple.  Cardiovascular: Normal rate and regular rhythm.  Pulmonary/Chest: Effort normal and breath sounds normal.  Musculoskeletal:  Normal Muscle Bulk and Muscle Testing Reveals: Upper Extremities: Full ROM and Muscle Strength 5/5 Lumbar Paraspinal Tenderness: L-4-L-6 Lumbar Paraspinal Tenderness: L-4-L-5 Lower Extremities: Full ROM and Muscle Strength 5/5 Arises from Table with Ease Narrow Based gait  Neurological: She is alert and oriented to person, place, and time.  Skin: Skin is warm and dry.  Psychiatric: She has a normal mood and affect. Her behavior is normal.  Nursing note and vitals reviewed.         Assessment & Plan:  1. History of chronic lumbar facet disease with spondylosis/DDD/ L4-5 spondylolisthesis.With L4-5 L5-S1 decompression stabilization: 07/27/2018 Continue: Hydrocodone 10/325mg  one tablet every 6 hours as needed #120.  We will continue the opioid monitoring program, this consists of regular clinic visits, examinations, urine drug screen, pill counts as well as use of [place] Controlled Substance Reporting System. 2. Depression with anxiety/ PTSD:  Dr. Sima Matas Following. 07/27/2018.Continue Xanax, and Effexor. 3. Cervical spondylosis: Stable at this time with no complaints. Continue Medication Regime. 07/27/2018 4. 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 Continue Ritalin 10 mg one tablet twice a day 0700 and noon #60. Dr. Naaman Plummer note was reviewed, we discuss the slow titration in the fall, she verbalizes understanding. 07/27/2018 5. Muscle Spasm: Continuecurrent medication regimen withFlexeril: May take 1-2 tablets at HS. 07/27/2018   F/U in 1 month

## 2018-07-28 ENCOUNTER — Encounter (HOSPITAL_BASED_OUTPATIENT_CLINIC_OR_DEPARTMENT_OTHER): Payer: 59 | Admitting: Psychology

## 2018-07-28 DIAGNOSIS — F411 Generalized anxiety disorder: Secondary | ICD-10-CM

## 2018-07-28 DIAGNOSIS — F4310 Post-traumatic stress disorder, unspecified: Secondary | ICD-10-CM

## 2018-07-28 DIAGNOSIS — F0781 Postconcussional syndrome: Secondary | ICD-10-CM | POA: Diagnosis not present

## 2018-07-28 DIAGNOSIS — M47817 Spondylosis without myelopathy or radiculopathy, lumbosacral region: Secondary | ICD-10-CM

## 2018-07-29 NOTE — Progress Notes (Signed)
Patient:                           [name]           DOB:                               November 04, 1955  MR Number:                  [id number]  Location:                        [facility] [address] [place] [id number] [place] [address] Dept: [phone]  Start:  1 PM End:                                2 PM  Provider/Observer:                           Edgardo Roys PSYD  Chief Complaint:                                   Chief Complaint  Patient presents with  . Post-Traumatic Stress Disorder  . Pain  . Depression  . Memory Loss  . Anxiety    Reason For Service:                         [name] is a 62 year old female referred by Dr. Naaman Plummer due to issues of coping and dealing with a number of stressors. She has a history of postconcussion syndrome, chronic pain syndrome as well as PTSD.  The patient has been dealing with significant anxiety and depression along with a history of PTSD and postconcussion syndrome. The patient was referred for psychotherapeutic interventions primarily because of the issues of depression and coping with family stress.  The above reason for service was reviewed and remains accurate.  The patient reports that there has been significant improvements in psychosocial issues but there continues to be a lot of stress particularly around issues related to her mother's dementia and the mother not recognizing the patient.  The above reason for service has been reviewed and is applicable for the current visit.  The patient continues to have a lot of psychosocial stressors that have triggered her chronic pain to worsen and PTSD symptoms.   Interventions Strategy:                    Cognitive/behavioral psychotherapeutic interventions.      Participation Level:                           The patient was active to this event today and  appropriate interactions.  Participation Quality:                       Appropriate and attentive                          Behavioral Observation:  The patient was well-groomed, alert and appropriate in her interactions.  Current Psychosocial Factors:        The patient reports that she has been working on the psychosocial issues and conflicts between her and her sisters particularly her oldest sister around issues associated with her parents death in the wheel.  Content of Session:                           Reviewed current symptoms and continue to work on therapeutic issues around pain, PTSD, and depression/anxiety.  Current Status:                                   The patient reports that her coping skills and strategies have been improving significantly.  The patient reports that her anxiety and depression have improved and that she is not having ruminating thoughts struggles.  Patient Progress:                               The patient continues to show improvement in her overall functioning.  Impression/Diagnosis:                     [name] is a 62 year old female referred by Dr. Naaman Plummer due to issues of coping and dealing with a number of stressors. She has a history of postconcussion syndrome, chronic pain syndrome as well as PTSD. The patient describes significant difficulty with issues related to her family and the family estate. There were issues when her father passed away where an older sister was in charge of everything and all of his possessions were been ago to this older sister. However, the middle sister began having trouble and when the patient could not help the sister rejected the patient. There've been a number of family conflicts going on including a niece who got in trouble with doing drugs and her excuse to the patient's sister was that the patient was the one who started her doing drugs and giving her money. This is not accurate.  The above  impression/diagnostic considerations have been reviewed and remains applicable and accurate for the current visit.   Diagnosis:   Post concussion syndrome  Lumbosacral spondylosis without myelopathy  PTSD (post-traumatic stress disorder)  GAD (generalized anxiety disorder)

## 2018-08-10 ENCOUNTER — Ambulatory Visit (INDEPENDENT_AMBULATORY_CARE_PROVIDER_SITE_OTHER): Payer: 59 | Admitting: Endocrinology

## 2018-08-10 ENCOUNTER — Encounter: Payer: Self-pay | Admitting: Endocrinology

## 2018-08-10 VITALS — BP 138/82 | HR 101 | Ht 61.0 in | Wt 164.0 lb

## 2018-08-10 DIAGNOSIS — Z0000 Encounter for general adult medical examination without abnormal findings: Secondary | ICD-10-CM

## 2018-08-10 DIAGNOSIS — Z23 Encounter for immunization: Secondary | ICD-10-CM

## 2018-08-10 DIAGNOSIS — M810 Age-related osteoporosis without current pathological fracture: Secondary | ICD-10-CM

## 2018-08-10 DIAGNOSIS — R739 Hyperglycemia, unspecified: Secondary | ICD-10-CM | POA: Diagnosis not present

## 2018-08-10 NOTE — Progress Notes (Signed)
Subjective:    Patient ID: [name], female    DOB: 11-05-1955, 62 y.o.   MRN: [id number]  HPI Pt is here for regular wellness examination, and is feeling pretty well in general, and says chronic med probs are stable, except as noted below Past Medical History:  Diagnosis Date  . Anemia   . ANXIETY 06/10/2007  . ASYMPTOMATIC POSTMENOPAUSAL STATUS 07/19/2009  . Chronic headaches   . Cough 04/02/2009  . Degenerative arthritis of spine    back and neck  . Depression   . DYSLIPIDEMIA 06/28/2008  . Esophageal stricture   . Fibromyalgia   . Gallstones   . GERD 06/10/2007  . Hiatal hernia   . 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 HYPERGLYCEMIA 06/28/2008  . HYPERTENSION 06/10/2007  . Irritable bowel syndrome 06/28/2008  . OSTEOARTHRITIS 06/10/2007  . Pneumonia   . Skin cancer    basal cell  . Tubular adenoma of colon   . UPPER RESPIRATORY INFECTION 03/26/2009  . WEIGHT GAIN 01/21/2010    Past Surgical History:  Procedure Laterality Date  . ABDOMINAL HYSTERECTOMY  1999  . bunions removed  1988  . C4 C5 cage insertion  06/28/2013  . CHOLECYSTECTOMY  04/25/2008  . EDG  12/17/2004  . ELECTROCARDIOGRAM  05/27/2007  . L5 S1 rod insertion  11/07/2012  . SKIN CANCER EXCISION    . SKIN CANCER EXCISION     several  . TONSILLECTOMY  1981  . TUBAL LIGATION  1997    Social History   Socioeconomic History  . Marital status: Married    Spouse name: Not on file  . Number of children: 0  . Years of education: Not on file  . Highest education level: Not on file  Occupational History  . Occupation: disabled  Social Needs  . Financial resource strain: Not on file  . Food insecurity:    Worry: Not on file    Inability: Not on file  . Transportation needs:    Medical: Not on file    Non-medical: Not on file  Tobacco Use  . Smoking status: Never Smoker  . Smokeless tobacco: Never Used  Substance and Sexual Activity  . Alcohol use: No    Alcohol/week: 0.0 standard drinks  . Drug use: No  . Sexual  activity: Yes  Lifestyle  . Physical activity:    Days per week: Not on file    Minutes per session: Not on file  . Stress: Not on file  Relationships  . Social connections:    Talks on phone: Not on file    Gets together: Not on file    Attends religious service: Not on file    Active member of club or organization: Not on file    Attends meetings of clubs or organizations: Not on file    Relationship status: Not on file  . Intimate partner violence:    Fear of current or ex partner: Not on file    Emotionally abused: Not on file    Physically abused: Not on file    Forced sexual activity: Not on file  Other Topics Concern  . Not on file  Social History Narrative   Pt has h.s. degree    Current Outpatient Medications on File Prior to Visit  Medication Sig Dispense Refill  . acyclovir (ZOVIRAX) 800 MG tablet Take 1 tablet (800 mg total) by mouth daily. 90 tablet 3  . ALPRAZolam (XANAX) 0.5 MG tablet Take 1 tablet (0.5 mg total) by mouth 3 (  three) times daily as needed for anxiety. 90 tablet 3  . cyclobenzaprine (FLEXERIL) 5 MG tablet TAKE 1 TABLET AT BEDTIME AS NEEDED FOR MUSCLE SPASM MAY TAKE AN EXTRA TABLET AS NEEDED 45 tablet 3  . HYDROcodone-acetaminophen (NORCO) 10-325 MG tablet Take 1 tablet by mouth every 6 (six) hours as needed. 120 tablet 0  . irbesartan-hydrochlorothiazide (AVALIDE) 150-12.5 MG tablet Take 0.5 tablets by mouth daily. 45 tablet 3  . meclizine (ANTIVERT) 12.5 MG tablet TAKE 1 TABLET (12.5 MG TOTAL) BY MOUTH 3 (THREE) TIMES DAILY AS NEEDED. 45 tablet 2  . methylphenidate (RITALIN) 10 MG tablet Take 1 tablet (10 mg total) by mouth 2 (two) times daily with breakfast and lunch. 3664,4034 60 tablet 0  . ranitidine (ZANTAC) 150 MG capsule Take 1 capsule (150 mg total) by mouth every evening. 30 capsule 6  . venlafaxine XR (EFFEXOR-XR) 150 MG 24 hr capsule TAKE 1 CAPSULE DAILY WITH BREAKFAST 90 capsule 3   No current facility-administered medications on file  prior to visit.     Allergies  Allergen Reactions  . Gabapentin Other (See Comments)    Bad headaches  . Poison Ivy Extract [Poison Ivy Extract] Rash  . Poison Oak Extract [Poison Oak Extract] Rash  . Poison Sumac Extract Rash    Family History  Problem Relation Age of Onset  . Cervical cancer Mother   . Diabetes Mother   . Stroke Mother   . Heart attack Father   . Stroke Father   . Colon cancer Father   . Anxiety disorder Maternal Aunt   . Depression Maternal Aunt   . Anxiety disorder Maternal Uncle   . Depression Maternal Uncle        suicide attempt by gun shot wound to head. Survived  . Alcohol abuse Other   . Drug abuse Other   . Breast cancer Maternal Grandmother   . Diabetes Maternal Grandmother   . Breast cancer Paternal Aunt   . Colon polyps Sister        x 2    BP 138/82 (BP Location: Right Arm, Cuff Size: Normal)   Pulse (!) 101   Ht 5\' 1"  (1.549 m)   Wt 164 lb (74.4 kg)   SpO2 97%   BMI 30.99 kg/m     Review of Systems Denies fever, visual loss, hearing loss, chest pain, sob, cold intolerance, BRBPR, hematuria, syncope, numbness, allergy sxs, and rash. No change in chronic low-back pain or fatigue.  Depression is well-controlled.  She has easy bruising.      Objective:   Physical Exam VS: see vs page GEN: no distress HEAD: head: no deformity eyes: no periorbital swelling, no proptosis external nose and ears are normal mouth: no lesion seen NECK: supple, thyroid is not enlarged CHEST WALL: no deformity LUNGS: clear to auscultation CV: reg rate and rhythm, no murmur ABD: abdomen is soft, nontender.  no hepatosplenomegaly.  not distended.  no hernia MUSCULOSKELETAL: muscle bulk and strength are grossly normal.  no obvious joint swelling.  gait is normal and steady.   EXTEMITIES: no deformity.  no ulcer on the feet.  feet are of normal color and temp.  no edema PULSES: dorsalis pedis intact bilat.  no carotid bruit NEURO:  cn 2-12 grossly intact.    readily moves all 4's.  sensation is intact to touch on the feet SKIN:  Normal texture and temperature.  No rash or suspicious lesion is visible.   NODES:  None palpable at the neck  PSYCH: alert, well-oriented.  Does not appear anxious nor depressed.       Assessment & Plan:  Wellness visit today, with problems stable, except as noted.  Patient Instructions  Please consider these measures for your health:  minimize alcohol.  Do not use tobacco products.  Have a colonoscopy at least every 10 years from age 102.  Women should have an annual mammogram from age 59.  Keep firearms safely stored.  Always use seat belts.  have working smoke alarms in your home.  See an eye doctor and dentist regularly.  Never drive under the influence of alcohol or drugs (including prescription drugs).  Those with fair skin should take precautions against the sun, and should carefully examine their skin once per month, for any new or changed moles. blood tests are requested for you today.  We'll let you know about the results.   Best wishes with your new primary care provider.

## 2018-08-10 NOTE — Patient Instructions (Addendum)
Please consider these measures for your health:  minimize alcohol.  Do not use tobacco products.  Have a colonoscopy at least every 10 years from age 61.  Women should have an annual mammogram from age 19.  Keep firearms safely stored.  Always use seat belts.  have working smoke alarms in your home.  See an eye doctor and dentist regularly.  Never drive under the influence of alcohol or drugs (including prescription drugs).  Those with fair skin should take precautions against the sun, and should carefully examine their skin once per month, for any new or changed moles. blood tests are requested for you today.  We'll let you know about the results.   Best wishes with your new primary care provider.

## 2018-08-23 ENCOUNTER — Other Ambulatory Visit: Payer: Self-pay

## 2018-08-23 DIAGNOSIS — F0781 Postconcussional syndrome: Secondary | ICD-10-CM

## 2018-08-23 DIAGNOSIS — M47817 Spondylosis without myelopathy or radiculopathy, lumbosacral region: Secondary | ICD-10-CM

## 2018-08-24 ENCOUNTER — Telehealth: Payer: Self-pay | Admitting: Registered Nurse

## 2018-08-24 DIAGNOSIS — F0781 Postconcussional syndrome: Secondary | ICD-10-CM

## 2018-08-24 DIAGNOSIS — M47817 Spondylosis without myelopathy or radiculopathy, lumbosacral region: Secondary | ICD-10-CM

## 2018-08-24 MED ORDER — HYDROCODONE-ACETAMINOPHEN 10-325 MG PO TABS: 1.0000 | ORAL_TABLET | Freq: Four times a day (QID) | ORAL | 0 refills | Status: DC | PRN

## 2018-08-24 MED ORDER — METHYLPHENIDATE HCL 10 MG PO TABS: 10.0000 mg | ORAL_TABLET | Freq: Two times a day (BID) | ORAL | 0 refills | Status: DC

## 2018-08-24 NOTE — Telephone Encounter (Signed)
Received a My Chart message from [name] regarding her Hydrocodone and Ritalin. PMP Aware was reviewed last refill was on 07/27/2018. Placed a call to [facility] no prescription on file. Prescriptions was e-scribe. Sent a message to [name] via My Chart regarding the above.

## 2018-08-25 ENCOUNTER — Encounter: Payer: Self-pay | Admitting: Psychology

## 2018-08-25 ENCOUNTER — Encounter: Payer: 59 | Attending: Physical Medicine and Rehabilitation | Admitting: Psychology

## 2018-08-25 DIAGNOSIS — M461 Sacroiliitis, not elsewhere classified: Secondary | ICD-10-CM | POA: Insufficient documentation

## 2018-08-25 DIAGNOSIS — F0781 Postconcussional syndrome: Secondary | ICD-10-CM | POA: Diagnosis not present

## 2018-08-25 DIAGNOSIS — M47817 Spondylosis without myelopathy or radiculopathy, lumbosacral region: Secondary | ICD-10-CM | POA: Insufficient documentation

## 2018-08-25 DIAGNOSIS — F4310 Post-traumatic stress disorder, unspecified: Secondary | ICD-10-CM

## 2018-08-25 DIAGNOSIS — F411 Generalized anxiety disorder: Secondary | ICD-10-CM

## 2018-08-25 NOTE — Progress Notes (Signed)
Patient:                           [name]           DOB:                               1956/01/19  MR Number:                  [id number]  Location:                        [facility] [address], [place] [id number] [place]  [address] Dept: [phone]  Start:  1 PM End:                                2 PM  Provider/Observer:                           Edgardo Roys PSYD  Chief Complaint:                                   Chief Complaint  Patient presents with  . Post-Traumatic Stress Disorder  . Pain  . Depression  . Memory Loss  . Anxiety    Reason For Service:                         [name] is a 62 year old female referred by Dr. Naaman Plummer due to issues of coping and dealing with a number of stressors. She has a history of postconcussion syndrome, chronic pain syndrome as well as PTSD.  The patient has been dealing with significant anxiety and depression along with a history of PTSD and postconcussion syndrome. The patient was referred for psychotherapeutic interventions primarily because of the issues of depression and coping with family stress.  The above reason for service was reviewed and remains accurate.  The patient reports that there has been significant improvements in psychosocial issues but there continues to be a lot of stress particularly around issues related to her mother's dementia and the mother not recognizing the patient.  The above reason for service has been reviewed and is applicable for the current visit.  The patient reports that some of the psychosocial stressors between her and her sisters have improved significantly.  We have been working on how to address these issues and working on problem-solving to reduce the stress and conflict as their mother is now at the end stages of Alzheimer's and there is been a lot of difficulties  associated with that.  The patient reports that her PTSD symptoms have been improving and she is coping better.   Interventions Strategy:                    Cognitive/behavioral psychotherapeutic intervention      Participation Level:                           The patient was appropriate and active in the current visit today and was well oriented with good mental status.  Participation Quality:                       Appropriate and attentive                          Behavioral Observation:                  The patient was well-groomed, alert and appropriate in her interactions.  Current Psychosocial Factors:        The patient reports that she is continuing to work on some of the conflict and issues between her and her sisters.  The patient reports that there is been a mild breakthrough between her and her sister [name] and the patient was able to go to Betty's/her mother's house and see all of the household items but the patient had been fearful were being gotten rid of.  Content of Session:                           Reviewed current symptoms and continue to work on therapeutic issues around pain, PTSD, and depression/anxiety.  Current Status:                                   T the patient reports that her coping skills and strategies have continued to improve and that she is doing much better as far as her interactions.  She reports that her anxiety and depressive symptoms have been improving.  Patient Progress:                               The patient continues to show improvement in her overall functioning.  Impression/Diagnosis:                     [name] is a 62 year old female referred by Dr. Naaman Plummer due to issues of coping and dealing with a number of stressors. She has a history of postconcussion syndrome, chronic pain syndrome as well as PTSD. The patient describes significant difficulty with issues related to her family and the family estate. There were issues when her  father passed away where an older sister was in charge of everything and all of his possessions were been ago to this older sister. However, the middle sister began having trouble and when the patient could not help the sister rejected the patient. There've been a number of family conflicts going on including a niece who got in trouble with doing drugs and her excuse to the patient's sister was that the patient was the one who started her doing drugs and giving her money. This is not accurate.  The above impression/diagnostic considerations have been reviewed and remains applicable and accurate for the current visit.   Diagnosis:   Lumbosacral spondylosis without myelopathy  Post concussion syndrome  PTSD (post-traumatic stress disorder)  GAD (generalized anxiety disorder)

## 2018-08-29 ENCOUNTER — Encounter (HOSPITAL_BASED_OUTPATIENT_CLINIC_OR_DEPARTMENT_OTHER): Payer: 59 | Admitting: Physical Medicine & Rehabilitation

## 2018-08-29 ENCOUNTER — Telehealth: Payer: Self-pay | Admitting: Registered Nurse

## 2018-08-29 ENCOUNTER — Encounter: Payer: Self-pay | Admitting: Physical Medicine & Rehabilitation

## 2018-08-29 VITALS — BP 117/76 | HR 116 | Resp 14 | Ht 61.0 in | Wt 161.0 lb

## 2018-08-29 DIAGNOSIS — M47816 Spondylosis without myelopathy or radiculopathy, lumbar region: Secondary | ICD-10-CM

## 2018-08-29 DIAGNOSIS — F0781 Postconcussional syndrome: Secondary | ICD-10-CM | POA: Diagnosis not present

## 2018-08-29 DIAGNOSIS — M47817 Spondylosis without myelopathy or radiculopathy, lumbosacral region: Secondary | ICD-10-CM | POA: Diagnosis not present

## 2018-08-29 DIAGNOSIS — F411 Generalized anxiety disorder: Secondary | ICD-10-CM | POA: Diagnosis not present

## 2018-08-29 DIAGNOSIS — F4310 Post-traumatic stress disorder, unspecified: Secondary | ICD-10-CM

## 2018-08-29 MED ORDER — ALPRAZOLAM 1 MG PO TABS: ORAL_TABLET | ORAL | 1 refills | Status: DC

## 2018-08-29 MED ORDER — ALPRAZOLAM 0.5 MG PO TABS: 0.5000 mg | ORAL_TABLET | Freq: Three times a day (TID) | ORAL | 3 refills | Status: DC | PRN

## 2018-08-29 NOTE — Patient Instructions (Signed)
WORK ON WAYS TO GET YOUR MIND OFF YOUR LOSS.   USE YOUR XANAX IF ANXIETY IS BAD.    IF YOU CONTINUE TO FEAL BAD, CALL us AND I''LL MAKE SOME CHANGES TO YOUR MEDICATION REGIMEN.    HANG IN THERE  :)

## 2018-08-29 NOTE — Telephone Encounter (Signed)
Received a call from Ms. Eppard reporting [facility] is out of stock of Alprazolam. Placed a call to [facility] spoke with Pharmacist Meridith, she stated the Alprazolam 0.5 mg was recalled. We will change prescription to Alprazolam 1 mg take 0.5 tablets  three times a day as needed for anxiety. Placed a call to Ms. [place] regarding the above, no answer left message regarding the following and to call the office. Awaiting a return call.

## 2018-08-29 NOTE — Progress Notes (Signed)
Subjective:    Patient ID: [name], female    DOB: 23-Jul-1956, 62 y.o.   MRN: [id number]  HPI   [name] is here in follow-up of her chronic pain.  I last saw her in July.  She has maintained follow-up with Dr. Sima Matas for her PTSD and associated symptoms.  She just had her hydrocodone and Ritalin refilled on Friday.  She has continued to deal with some conflicts within the family.  Also she just lost her dog unexpectedly over the weekend which has been very traumatic for her and her mother is poor health. She has endstage dementia and may not make it more than a couple more months.  Prior to our last visit in July, she is actually been doing fairly well.  She continues on hydrocodone for pain control and Ritalin for attention.  She has used Xanax for breakthrough anxiety.  She was unable to get it filled recently as the pharmacy told her it was out of stock.   Pain Inventory Average Pain 9 Pain Right Now 9 My pain is sharp, burning, dull, stabbing, tingling and aching  In the last 24 hours, has pain interfered with the following? General activity 10 Relation with others 10 Enjoyment of life 10 What TIME of day is your pain at its worst? all Sleep (in general) Fair  Pain is worse with: sitting, inactivity, standing and unsure Pain improves with: rest, heat/ice, pacing activities, medication and injections Relief from Meds: 7  Mobility walk with assistance use a cane ability to climb steps?  yes do you drive?  yes  Function disabled: date disabled .  Neuro/Psych bladder control problems weakness numbness tremor tingling trouble walking spasms dizziness confusion depression anxiety loss of taste or smell  Prior Studies Any changes since last visit?  no  Physicians involved in your care Any changes since last visit?  no   Family History  Problem Relation Age of Onset  . Cervical cancer Mother   . Diabetes Mother   . Stroke Mother   . Heart attack  Father   . Stroke Father   . Colon cancer Father   . Anxiety disorder Maternal Aunt   . Depression Maternal Aunt   . Anxiety disorder Maternal Uncle   . Depression Maternal Uncle        suicide attempt by gun shot wound to head. Survived  . Alcohol abuse Other   . Drug abuse Other   . Breast cancer Maternal Grandmother   . Diabetes Maternal Grandmother   . Breast cancer Paternal Aunt   . Colon polyps Sister        x 2   Social History   Socioeconomic History  . Marital status: Married    Spouse name: Not on file  . Number of children: 0  . Years of education: Not on file  . Highest education level: Not on file  Occupational History  . Occupation: disabled  Social Needs  . Financial resource strain: Not on file  . Food insecurity:    Worry: Not on file    Inability: Not on file  . Transportation needs:    Medical: Not on file    Non-medical: Not on file  Tobacco Use  . Smoking status: Never Smoker  . Smokeless tobacco: Never Used  Substance and Sexual Activity  . Alcohol use: No    Alcohol/week: 0.0 standard drinks  . Drug use: No  . Sexual activity: Yes  Lifestyle  . Physical activity:  Days per week: Not on file    Minutes per session: Not on file  . Stress: Not on file  Relationships  . Social connections:    Talks on phone: Not on file    Gets together: Not on file    Attends religious service: Not on file    Active member of club or organization: Not on file    Attends meetings of clubs or organizations: Not on file    Relationship status: Not on file  Other Topics Concern  . Not on file  Social History Narrative   Pt has h.s. degree   Past Surgical History:  Procedure Laterality Date  . ABDOMINAL HYSTERECTOMY  1999  . bunions removed  1988  . C4 C5 cage insertion  06/28/2013  . CHOLECYSTECTOMY  04/25/2008  . EDG  12/17/2004  . ELECTROCARDIOGRAM  05/27/2007  . L5 S1 rod insertion  11/07/2012  . SKIN CANCER EXCISION    . SKIN CANCER EXCISION      several  . TONSILLECTOMY  1981  . TUBAL LIGATION  1997   Past Medical History:  Diagnosis Date  . Anemia   . ANXIETY 06/10/2007  . ASYMPTOMATIC POSTMENOPAUSAL STATUS 07/19/2009  . Chronic headaches   . Cough 04/02/2009  . Degenerative arthritis of spine    back and neck  . Depression   . DYSLIPIDEMIA 06/28/2008  . Esophageal stricture   . Fibromyalgia   . Gallstones   . GERD 06/10/2007  . Hiatal hernia   . 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 HYPERGLYCEMIA 06/28/2008  . HYPERTENSION 06/10/2007  . Irritable bowel syndrome 06/28/2008  . OSTEOARTHRITIS 06/10/2007  . Pneumonia   . Skin cancer    basal cell  . Tubular adenoma of colon   . UPPER RESPIRATORY INFECTION 03/26/2009  . WEIGHT GAIN 01/21/2010   BP 117/76 (BP Location: Left Arm, Patient Position: Sitting, Cuff Size: Normal)   Pulse (!) 116   Resp 14   Ht 5\' 1"  (1.549 m)   Wt 161 lb (73 kg)   SpO2 98%   BMI 30.42 kg/m    Opioid Risk Score:   Fall Risk Score:  `1  Depression screen PHQ 2/9  Depression screen [facility] 2/9 03/28/2018 01/25/2018 11/09/2017 10/08/2017 08/09/2017 04/28/2017 01/19/2017  Decreased Interest 3 3 1  - 1 1 0  Down, Depressed, Hopeless 3 2 1  - 1 1 1   PHQ - 2 Score 6 5 2  - 2 2 1   Altered sleeping - 0 - 1 - - -  Tired, decreased energy - 3 - 1 - - -  Change in appetite - 1 - - - - -  Feeling bad or failure about yourself  - 1 - - - - -  Trouble concentrating - 2 - - - - -  Moving slowly or fidgety/restless - 0 - - - - -  Suicidal thoughts - 0 - - - - -  PHQ-9 Score - 12 - - - - -  Some recent data might be hidden    Review of Systems  Gastrointestinal: Negative.   Genitourinary: Positive for difficulty urinating.  Musculoskeletal: Positive for arthralgias, back pain, gait problem and myalgias.       Spasms   Skin: Negative.   Neurological: Positive for dizziness, tremors, weakness and numbness.       Tingling  Psychiatric/Behavioral: Positive for confusion and dysphoric mood. The patient is nervous/anxious.          Objective:   Physical Exam  General: No  acute distress HEENT: EOMI, oral membranes moist Cards: reg rate  Chest: normal effort Abdomen: Soft, NT, ND Skin: dry, intact Extremities: no edema Musculoskeletal:low back tender, left knee in line, mild antalgia Neurological: memory and cognition are at baseline. Strength symmetrical. Sensory is normal. DTR's 1+.  Skin: Skin is warm.  Psychiatric: affect bright and pleasant.    Assessment & Plan:  ASSESSMENT:  1. 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 s/p L4-5 L5-S1 decompression stabilization with substantial mprovement of her back and right leg pain.  2. Left knee meniscus injury.  3. Depression with anxiety.  High anxiety after loss of dog, and with declining health of mother 4. Cervical spondylosis/Myofascial pain.  5. Concussion on 05/13/14 with post concussion syndrome-   6. 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 OA right knee/meniscus---good results with June injection 8. Left 2nd MCP injury from late June---mild tendonitis---responsive to injections      PLAN:  1.Maintain ritalin 10mg  for attention/arousal---refilled #60.    2.refilled xanax. Consider effexor increase or addition of abilify if symptoms continue/progress 3. Hydrocodone will continue for breakthrough pain 10/325 q6 prn #120. Was just refilled last week. -We will continue the controlled substance monitoring program, this consists of regular clinic visits, examinations, routine drug screening, pill counts as well as use of [place] Controlled Substance Reporting System. [place].  .   4. Continue with counseling with Dr. Sima Matas which has been extremely helpful. Involvement with church has helped too.   -discussed ways to get her mind off her sorrow/losses/stress.   -hopefully xanax will help take the edge off in short term  -asked to her fall back  on church, family, friends for support and distraction.  5. Off elavil due to side effect.  6.Follow up in about49monthswith NP. 63minutes of face to face patient care time were spent during this visit. All questions were encouraged and answered.

## 2018-08-30 ENCOUNTER — Telehealth: Payer: Self-pay | Admitting: *Deleted

## 2018-08-30 MED ORDER — LURASIDONE HCL 20 MG PO TABS: 20.0000 mg | ORAL_TABLET | Freq: Every day | ORAL | 2 refills | Status: DC

## 2018-08-30 NOTE — Telephone Encounter (Signed)
I called [name] and left her a VM that I sent rx for latuda to pharmacy.

## 2018-08-30 NOTE — Telephone Encounter (Signed)
[name] called back an is very teary and not feeling better.  She wants Dr Naaman Plummer to make the adjustments to her medications he mentioned in her appt.

## 2018-08-31 ENCOUNTER — Telehealth: Payer: Self-pay

## 2018-08-31 NOTE — Telephone Encounter (Signed)
PA for Latuda approved 08/31/2018 - 08/31/2020. Patient and pharmacy has been notified.

## 2018-09-12 ENCOUNTER — Encounter: Payer: 59 | Attending: Physical Medicine and Rehabilitation | Admitting: Psychology

## 2018-09-12 DIAGNOSIS — F0781 Postconcussional syndrome: Secondary | ICD-10-CM

## 2018-09-12 DIAGNOSIS — F411 Generalized anxiety disorder: Secondary | ICD-10-CM | POA: Insufficient documentation

## 2018-09-12 DIAGNOSIS — F4310 Post-traumatic stress disorder, unspecified: Secondary | ICD-10-CM | POA: Diagnosis not present

## 2018-09-12 DIAGNOSIS — M47817 Spondylosis without myelopathy or radiculopathy, lumbosacral region: Secondary | ICD-10-CM

## 2018-09-12 DIAGNOSIS — M461 Sacroiliitis, not elsewhere classified: Secondary | ICD-10-CM | POA: Diagnosis present

## 2018-09-13 DIAGNOSIS — Z01419 Encounter for gynecological examination (general) (routine) without abnormal findings: Secondary | ICD-10-CM | POA: Diagnosis not present

## 2018-09-13 DIAGNOSIS — Z6831 Body mass index (BMI) 31.0-31.9, adult: Secondary | ICD-10-CM | POA: Diagnosis not present

## 2018-09-13 NOTE — Progress Notes (Signed)
Patient:                           [name]           DOB:                               09-08-1956  MR Number:                  [id number]  Location:                        [facility] [address] [place] [address] Dept: [phone]  Start:  4 PM End:                                5 PM  Provider/Observer:                           Edgardo Roys PSYD  Chief Complaint:                                   Chief Complaint  Patient presents with  . Post-Traumatic Stress Disorder  . Pain  . Depression  . Memory Loss  . Anxiety    Reason For Service:                         [name] is a 62 year old female referred by Dr. Naaman Plummer due to issues of coping and dealing with a number of stressors. She has a history of postconcussion syndrome, chronic pain syndrome as well as PTSD.  The patient has been dealing with significant anxiety and depression along with a history of PTSD and postconcussion syndrome. The patient was referred for psychotherapeutic interventions primarily because of the issues of depression and coping with family stress.  The above reason for service was reviewed and remains accurate.  The patient reports that there has been significant improvements in psychosocial issues but there continues to be a lot of stress particularly around issues related to her mother's dementia and the mother not recognizing the patient.  The above reason for service has been reviewed and is applicable for the current visit.  The patient reports that some of the psychosocial stressors between her and her sisters have improved significantly.  We have been working on how to address these issues and working on problem-solving to reduce the stress and conflict as their mother is now at the end stages of Alzheimer's and there is been a lot of difficulties  associated with that.  The patient reports that her PTSD symptoms have been improving and she is coping better.  The patient reports that her mother is continued to deteriorate that she and her sisters have been addressing this for the most part.  She does report   Interventions Strategy:                    Cognitive/behavioral psychotherapeutic interventions  Participation Level:  The patient was appropriate and active in the current visit today and was well oriented and had good mental status/orientation.Participation Quality:                       Appropriate and attentive                          Behavioral Observation:                  The patient was well-groomed, alert and appropriate in her interactions.  Current Psychosocial Factors:        The patient reports that this was a very difficult holiday/Thanksgiving for her.  She reports that her dog died just before Thanksgiving and that her mother is continued to have significant deterioration in her functioning.  Her mother is stopped eating food and less really pushed.  The mother is essentially lost awareness of who her children are.  Content of Session:                           Reviewed current symptoms and continue to work on therapeutic issues around pain, PTSD, and depression/anxiety.  Current Status:                                   T the patient reports that her coping skills and strategies have continued to improve and that she is doing much better as far as her interactions.  She reports that her anxiety and depressive symptoms have been improving.  Patient Progress:                               The patient continues to show improvement in her overall functioning.  Impression/Diagnosis:                     [name] is a 62 year old female referred by Dr. Naaman Plummer due to issues of coping and dealing with a number of stressors. She has a history of postconcussion syndrome, chronic pain syndrome as  well as PTSD. The patient describes significant difficulty with issues related to her family and the family estate. There were issues when her father passed away where an older sister was in charge of everything and all of his possessions were been ago to this older sister. However, the middle sister began having trouble and when the patient could not help the sister rejected the patient. There've been a number of family conflicts going on including a niece who got in trouble with doing drugs and her excuse to the patient's sister was that the patient was the one who started her doing drugs and giving her money. This is not accurate.  The above impression/diagnostic considerations have been reviewed and remains applicable and accurate for the current visit.   Diagnosis:   GAD (generalized anxiety disorder)  Post concussion syndrome  PTSD (post-traumatic stress disorder)  Lumbosacral spondylosis without myelopathy

## 2018-09-14 NOTE — Telephone Encounter (Signed)
I called [name] and left her a VM regarding the latuda question. Asked her to call back if any other questions.

## 2018-09-18 ENCOUNTER — Other Ambulatory Visit: Payer: Self-pay | Admitting: Registered Nurse

## 2018-09-20 ENCOUNTER — Other Ambulatory Visit: Payer: Self-pay | Admitting: Family Medicine

## 2018-09-20 ENCOUNTER — Ambulatory Visit (INDEPENDENT_AMBULATORY_CARE_PROVIDER_SITE_OTHER): Payer: 59 | Admitting: Family Medicine

## 2018-09-20 ENCOUNTER — Encounter: Payer: Self-pay | Admitting: Family Medicine

## 2018-09-20 VITALS — BP 132/80 | HR 102 | Temp 98.6°F | Ht 61.25 in | Wt 162.2 lb

## 2018-09-20 DIAGNOSIS — K588 Other irritable bowel syndrome: Secondary | ICD-10-CM

## 2018-09-20 DIAGNOSIS — G894 Chronic pain syndrome: Secondary | ICD-10-CM

## 2018-09-20 DIAGNOSIS — F1190 Opioid use, unspecified, uncomplicated: Secondary | ICD-10-CM

## 2018-09-20 DIAGNOSIS — Z1239 Encounter for other screening for malignant neoplasm of breast: Secondary | ICD-10-CM | POA: Diagnosis not present

## 2018-09-20 DIAGNOSIS — I10 Essential (primary) hypertension: Secondary | ICD-10-CM | POA: Diagnosis not present

## 2018-09-20 DIAGNOSIS — E039 Hypothyroidism, unspecified: Secondary | ICD-10-CM

## 2018-09-20 DIAGNOSIS — M797 Fibromyalgia: Secondary | ICD-10-CM

## 2018-09-20 DIAGNOSIS — E782 Mixed hyperlipidemia: Secondary | ICD-10-CM | POA: Diagnosis not present

## 2018-09-20 DIAGNOSIS — M810 Age-related osteoporosis without current pathological fracture: Secondary | ICD-10-CM | POA: Diagnosis not present

## 2018-09-20 DIAGNOSIS — D126 Benign neoplasm of colon, unspecified: Secondary | ICD-10-CM | POA: Diagnosis not present

## 2018-09-20 DIAGNOSIS — K2100 Gastro-esophageal reflux disease with esophagitis, without bleeding: Secondary | ICD-10-CM

## 2018-09-20 LAB — TSH: TSH: 2.74 u[IU]/mL (ref 0.35–4.50)

## 2018-09-20 LAB — CBC WITH DIFFERENTIAL/PLATELET
Basophils Absolute: 0 10*3/uL (ref 0.0–0.1)
Basophils Relative: 0.8 % (ref 0.0–3.0)
EOS ABS: 0 10*3/uL (ref 0.0–0.7)
Eosinophils Relative: 1 % (ref 0.0–5.0)
HCT: 39.1 % (ref 36.0–46.0)
Hemoglobin: 13.1 g/dL (ref 12.0–15.0)
Lymphocytes Relative: 35.7 % (ref 12.0–46.0)
Lymphs Abs: 1.6 10*3/uL (ref 0.7–4.0)
MCHC: 33.6 g/dL (ref 30.0–36.0)
MCV: 82.1 fl (ref 78.0–100.0)
Monocytes Absolute: 0.3 10*3/uL (ref 0.1–1.0)
Monocytes Relative: 6.6 % (ref 3.0–12.0)
Neutro Abs: 2.5 10*3/uL (ref 1.4–7.7)
Neutrophils Relative %: 55.9 % (ref 43.0–77.0)
Platelets: 214 10*3/uL (ref 150.0–400.0)
RBC: 4.77 Mil/uL (ref 3.87–5.11)
RDW: 14.6 % (ref 11.5–15.5)
WBC: 4.4 10*3/uL (ref 4.0–10.5)

## 2018-09-20 LAB — URINALYSIS, ROUTINE W REFLEX MICROSCOPIC
Leukocytes, UA: NEGATIVE
Nitrite: NEGATIVE
Specific Gravity, Urine: >=1.03 — AB (ref 1.000–1.030)
Urine Glucose: NEGATIVE
Urobilinogen, UA: 1 (ref 0.0–1.0)
pH: 5.5 (ref 5.0–8.0)

## 2018-09-20 LAB — BASIC METABOLIC PANEL
BUN: 11 mg/dL (ref 6–23)
CO2: 33 mEq/L — ABNORMAL HIGH (ref 19–32)
Calcium: 9.8 mg/dL (ref 8.4–10.5)
Chloride: 103 mEq/L (ref 96–112)
Creatinine, Ser: 0.76 mg/dL (ref 0.40–1.20)
GFR: 82.12 mL/min (ref >60.00)
Glucose, Bld: 104 mg/dL — ABNORMAL HIGH (ref 70–99)
Potassium: 4 mEq/L (ref 3.5–5.1)
Sodium: 144 mEq/L (ref 135–145)

## 2018-09-20 LAB — LDL CHOLESTEROL, DIRECT: LDL DIRECT: 161 mg/dL

## 2018-09-20 LAB — HEMOGLOBIN A1C: Hgb A1c MFr Bld: 5.6 % (ref 4.6–6.5)

## 2018-09-20 LAB — HEPATIC FUNCTION PANEL
ALBUMIN: 4.8 g/dL (ref 3.5–5.2)
ALT: 24 U/L (ref 0–35)
AST: 23 U/L (ref 0–37)
Alkaline Phosphatase: 59 U/L (ref 39–117)
Bilirubin, Direct: 0.1 mg/dL (ref 0.0–0.3)
Total Bilirubin: 0.4 mg/dL (ref 0.2–1.2)
Total Protein: 6.8 g/dL (ref 6.0–8.3)

## 2018-09-20 LAB — LIPID PANEL
CHOLESTEROL: 280 mg/dL — AB (ref 0–200)
HDL: 46.8 mg/dL (ref >39.00)
Total CHOL/HDL Ratio: 6
Triglycerides: 401 mg/dL — ABNORMAL HIGH (ref 0.0–149.0)

## 2018-09-20 LAB — VITAMIN D 25 HYDROXY (VIT D DEFICIENCY, FRACTURES): VITD: 26.49 ng/mL — ABNORMAL LOW (ref 30.00–100.00)

## 2018-09-20 MED ORDER — ZOSTER VAC RECOMB ADJUVANTED 50 MCG/0.5ML IM SUSR: 0.5000 mL | Freq: Once | INTRAMUSCULAR | 0 refills | Status: AC

## 2018-09-20 NOTE — Patient Instructions (Signed)
Please return in 4-6 months for follow up of your hypertension.  Please go get your shingles vaccination.  I have ordered a mammogram  I will release your lab results to you on your MyChart account with further instructions. Please reply with any questions.   Happy holidays.   It was a pleasure meeting you today! Thank you for choosing [facility] to meet your healthcare needs! I truly look forward to working with you. If you have any questions or concerns, please send me a message via Mychart or call the office at [phone].

## 2018-09-20 NOTE — Progress Notes (Signed)
Subjective  CC:  Chief Complaint  Patient presents with  . Establish Care    Transfer Care from Dr. Loanne Drilling, last physical was 08/10/2018  . Knee Pain    Left Knee Pain new flare, sees Pain Clinic Dr. Naaman Plummer    HPI: [name] is a 62 y.o. female who presents to [place] at [facility] today to establish care with me as a new patient.   She has the following concerns or needs:  I reviewed old records from prior PCP and multiple specialist.  Very pleasant 62 year old female with multiple medical problems as listed below in her problem list.  She has multiple specialist caring for her.  Her main issues include fibromyalgia and chronic pain managed by Dr. Eda Keys, depression currently seeing a therapist.  She has hypertension hyperlipidemia and hypothyroidism.  These have been well controlled although she is due for lab work.  She had a recent physical but lab work was not done at that time.  She overall feels well except is down dealing with multiple home stressors.  She has no medication refill needs.  She does complain of left knee pain.  She has had history of Baker's cyst and osteoarthritis.  No recent trauma.  No redness or warmth.  No locking or give way.  Health maintenance: She is due for lab work, Shingrix vaccination and mammogram.  Assessment  1. Essential hypertension   2. Tubular adenoma of colon   3. Fibromyalgia   4. Breast cancer screening   5. Mixed hyperlipidemia   6. Gastroesophageal reflux disease with esophagitis   7. Other irritable bowel syndrome   8. Acquired hypothyroidism   9. Chronic pain syndrome   10. Chronic narcotic use   11. Osteoporosis without current pathological fracture, unspecified osteoporosis type      Plan   Multiple medical problems are at baseline and seem fairly well controlled.  Check blood work today.  Continue current medications without changes.  Follow-up with psychology for further therapy regarding her  depression.  Chronic pain management per pain clinic.  Health maintenance: Gave prescription for Shingrix vaccination.  Ordered mammogram.  Bone density screening is up-to-date as is colonoscopy.  She did have a sessile polyp that was precancerous.  She will call Dr. Oletta Lamas office to schedule her colonoscopy that is due now.  Chronic GERD and IBS managed by GI.  Currently stable  Follow up:  Return in about 4 months (around 01/20/2019) for follow up Hypertension. Orders Placed This Encounter  Procedures  . MM DIGITAL SCREENING BILATERAL  . PTH, intact and calcium   Meds ordered this encounter  Medications  . Zoster Vaccine Adjuvanted [facility]) injection    Sig: Inject 0.5 mLs into the muscle once for 1 dose. Please give 2nd dose 2-6 months after first dose    Dispense:  2 each    Refill:  0     Depression screen [facility] 2/9 09/20/2018 03/28/2018 01/25/2018 11/09/2017 10/08/2017  Decreased Interest 1 3 3 1  -  Down, Depressed, Hopeless 1 3 2 1  -  PHQ - 2 Score 2 6 5 2  -  Altered sleeping 1 - 0 - 1  Tired, decreased energy 1 - 3 - 1  Change in appetite 1 - 1 - -  Feeling bad or failure about yourself  1 - 1 - -  Trouble concentrating 1 - 2 - -  Moving slowly or fidgety/restless 1 - 0 - -  Suicidal thoughts 0 - 0 - -  PHQ-9 Score 8 - 12 - -  Difficult doing work/chores Extremely dIfficult - - - -  Some recent data might be hidden    We updated and reviewed the patient's past history in detail and it is documented below.  Patient Active Problem List   Diagnosis Date Noted  . Chronic narcotic use 09/20/2018  . Tubular adenoma of colon   . Fibromyalgia   . Chronic pain syndrome 04/27/2018  . Primary osteoarthritis of right knee 03/31/2016  . Iron deficiency anemia 08/01/2015  . Osteoporosis 08/17/2014  . Panic disorder without agoraphobia 07/18/2014  . PTSD (post-traumatic stress disorder) 03/13/2014  . MDD (major depressive disorder) 03/13/2014  . GAD (generalized anxiety  disorder) 03/13/2014  . Cervical spondylosis without myelopathy 12/02/2012  . Lumbosacral spondylosis without myelopathy 06/07/2012  . Lumbar facet arthropathy 02/05/2012  . Hypothyroidism 08/05/2011  . Hyperlipidemia 06/28/2008    Qualifier: Diagnosis of  By: Marca Ancona RMA, Lucy     . Irritable bowel syndrome 06/28/2008    Qualifier: Diagnosis of  By: Marca Ancona RMA, Lucy     . Essential hypertension 06/10/2007    Qualifier: Diagnosis of  By: Loanne Drilling MD, Jacelyn Pi    . GERD 06/10/2007    Qualifier: Diagnosis of  By: Loanne Drilling MD, Jacelyn Pi     Health Maintenance  Topic Date Due  . MAMMOGRAM  11/12/2017  . TETANUS/TDAP  08/03/2021  . COLONOSCOPY  10/12/2023  . INFLUENZA VACCINE  Completed  . Hepatitis C Screening  Completed  . HIV Screening  Completed  . PAP SMEAR  Discontinued   Immunization History  Administered Date(s) Administered  . H1N1 09/17/2008  . Hep A / Hep B 09/17/2008  . Hepatitis B 10/03/2008, 12/14/2008  . Influenza Split 08/04/2011  . Influenza Whole 06/28/2008, 06/12/2009, 08/12/2010  . Influenza,inj,Quad PF,6+ Mos 07/26/2013, 07/30/2014, 06/25/2015, 08/04/2016, 08/10/2017, 08/10/2018  . Pneumococcal Polysaccharide-23 06/28/2008  . Td 11/12/2001  . Tdap 08/04/2011  . Zoster 07/30/2014   Current Meds  Medication Sig  . acyclovir (ZOVIRAX) 800 MG tablet Take 1 tablet (800 mg total) by mouth daily.  Marland Kitchen ALPRAZolam (XANAX) 1 MG tablet Take 0.5 tablet three times a day  . cyclobenzaprine (FLEXERIL) 5 MG tablet TAKE 1 TABLET AT BEDTIME AS NEEDED FOR MUSCLE SPASM MAY TAKE AN EXTRA TABLET AS NEEDED  . HYDROcodone-acetaminophen (NORCO) 10-325 MG tablet Take 1 tablet by mouth every 6 (six) hours as needed.  . irbesartan-hydrochlorothiazide (AVALIDE) 150-12.5 MG tablet Take 0.5 tablets by mouth daily.  Marland Kitchen lurasidone (LATUDA) 20 MG TABS tablet Take 1 tablet (20 mg total) by mouth daily.  . meclizine (ANTIVERT) 12.5 MG tablet TAKE 1 TABLET (12.5 MG TOTAL) BY MOUTH 3 (THREE)  TIMES DAILY AS NEEDED.  Marland Kitchen methylphenidate (RITALIN) 10 MG tablet Take 1 tablet (10 mg total) by mouth 2 (two) times daily with breakfast and lunch. [id number]  . ranitidine (ZANTAC) 150 MG capsule Take 1 capsule (150 mg total) by mouth every evening.  . venlafaxine XR (EFFEXOR-XR) 150 MG 24 hr capsule TAKE 1 CAPSULE DAILY WITH BREAKFAST    Allergies: Patient is allergic to gabapentin; poison ivy extract [poison ivy extract]; poison oak extract [poison oak extract]; and poison sumac extract. 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 Past Surgical History Patient  has a past surgical history that includes Abdominal hysterectomy (1999); Tonsillectomy (1981); Tubal ligation (1997); bunions removed (1988); EDG (12/17/2004); electrocardiogram (05/27/2007); Skin cancer excision; Cholecystectomy (04/25/2008); C4 C5 cage insertion (06/28/2013); L5 S1 rod insertion (11/07/2012); and Skin cancer excision. Family History: Patient family history includes Alcohol abuse in her other; Anxiety disorder in her maternal aunt and maternal uncle; Breast cancer in her maternal grandmother and paternal aunt; Cervical cancer in her mother; Colon cancer in her father; Colon polyps in her sister; Depression in her maternal aunt and maternal uncle; Diabetes in her maternal grandmother and mother; Drug abuse in her other; Heart attack in her father; Stroke in her father and mother. Social History:  Patient  reports that she has never smoked. She has never used smokeless tobacco. She reports that she does not drink alcohol or use drugs.  Review of  Systems: Constitutional: negative for fever or malaise Ophthalmic: negative for photophobia, double vision or loss of vision Cardiovascular: negative for chest pain, dyspnea on exertion, or new LE swelling Respiratory: negative for SOB or persistent cough Gastrointestinal: negative for abdominal pain, change in bowel habits or melena Genitourinary: negative for dysuria or gross hematuria Musculoskeletal: negative for new gait disturbance or muscular weakness Integumentary: negative for new or persistent rashes Neurological: negative for TIA or stroke symptoms Psychiatric: negative for SI or delusions Allergic/Immunologic: negative for hives  Patient Care Team    Relationship Specialty Notifications Start End  Leamon Arnt, MD PCP - General Family Medicine  09/20/18   Meredith Staggers, MD Consulting Physician Physical Medicine and Rehabilitation  07/26/13   Eustace Moore, MD Consulting Physician Neurosurgery  07/26/13   Rolm Bookbinder, MD Consulting Physician Dermatology  07/26/13   Tyson Dense, MD Consulting Physician Obstetrics and Gynecology  08/10/17   Monna Fam, MD Consulting Physician Ophthalmology  09/20/18   Laurence Spates, MD Consulting Physician Gastroenterology  09/20/18   Jerene Bears, MD Consulting Physician Gastroenterology  09/20/18     Objective  Vitals: BP 132/80   Pulse (!) 102   Temp 98.6 F (37 C)   Ht 5' 1.25" (1.556 m)   Wt 162 lb 3.2 oz (73.6 kg)   SpO2 98%   BMI 30.40 kg/m  General:  Well developed, well nourished, no acute distress  Psych:  Alert and oriented,normal mood and affect HEENT:  Normocephalic, atraumatic, non-icteric sclera, PERRL, oropharynx is without mass or exudate, supple neck without adenopathy, mass or thyromegaly Cardiovascular:  RRR without gallop, rub or murmur, nondisplaced PMI Respiratory:  Good breath sounds bilaterally, CTAB with normal respiratory effort Gastrointestinal: normal bowel sounds, soft,  non-tender, no noted masses. No HSM MSK: no deformities, contusions. Joints are without erythema or swelling, mild crepitus on left otherwise normal knee exam Skin:  Warm, no rashes or suspicious lesions noted Neurologic:    Mental status is normal. Gross motor and sensory exams are normal. Normal gait   Commons side effects, risks, benefits, and alternatives for medications and treatment plan prescribed today were discussed, and the patient expressed understanding of the given instructions. Patient is instructed to call or message via MyChart if he/she has any questions or concerns regarding our treatment plan. No barriers to understanding were identified. We discussed Red Flag symptoms and signs in detail. Patient expressed understanding regarding what to do in case of urgent or emergency type symptoms.   Medication list was reconciled, printed and provided to the patient in AVS. Patient instructions and summary information was reviewed with the  patient as documented in the AVS. This note was prepared with assistance of Dragon voice recognition software. Occasional wrong-word or sound-a-like substitutions may have occurred due to the inherent limitations of voice recognition software

## 2018-09-21 ENCOUNTER — Telehealth: Payer: Self-pay

## 2018-09-21 DIAGNOSIS — F0781 Postconcussional syndrome: Secondary | ICD-10-CM

## 2018-09-21 DIAGNOSIS — M47817 Spondylosis without myelopathy or radiculopathy, lumbosacral region: Secondary | ICD-10-CM

## 2018-09-21 LAB — PTH, INTACT AND CALCIUM: PTH: 42 pg/mL (ref 15–65)

## 2018-09-21 NOTE — Telephone Encounter (Signed)
Placed a call to Ms. Ybanez, her appointment was changed to 09/22/18, arrival time 12:45 for 1:00 appointment. She verbalizes understanding.

## 2018-09-21 NOTE — Telephone Encounter (Signed)
Called patient to see if she can come in early for her appointment, no answer, left voicemail to return call.

## 2018-09-21 NOTE — Telephone Encounter (Signed)
Patients appointment is on 09-26-2018, last medication refill was on 08-24-2018:  PMP: Fill Date              Drug                                                    Qty                   Prescriber 08/24/2018       Hydrocodone-Acetamin 10-325mg       120.00                Eu Tho 08/24/2018       Methylphenidate 10mg                           60.00                 Eu Tho

## 2018-09-22 ENCOUNTER — Other Ambulatory Visit: Payer: Self-pay | Admitting: Endocrinology

## 2018-09-22 ENCOUNTER — Encounter: Payer: Self-pay | Admitting: Registered Nurse

## 2018-09-22 ENCOUNTER — Encounter (HOSPITAL_BASED_OUTPATIENT_CLINIC_OR_DEPARTMENT_OTHER): Payer: 59 | Admitting: Registered Nurse

## 2018-09-22 ENCOUNTER — Other Ambulatory Visit: Payer: Self-pay

## 2018-09-22 VITALS — BP 144/83 | HR 99 | Ht 61.0 in | Wt 163.0 lb

## 2018-09-22 DIAGNOSIS — Z79891 Long term (current) use of opiate analgesic: Secondary | ICD-10-CM

## 2018-09-22 DIAGNOSIS — F4310 Post-traumatic stress disorder, unspecified: Secondary | ICD-10-CM | POA: Diagnosis not present

## 2018-09-22 DIAGNOSIS — M47817 Spondylosis without myelopathy or radiculopathy, lumbosacral region: Secondary | ICD-10-CM

## 2018-09-22 DIAGNOSIS — G894 Chronic pain syndrome: Secondary | ICD-10-CM

## 2018-09-22 DIAGNOSIS — M961 Postlaminectomy syndrome, not elsewhere classified: Secondary | ICD-10-CM

## 2018-09-22 DIAGNOSIS — F411 Generalized anxiety disorder: Secondary | ICD-10-CM | POA: Diagnosis not present

## 2018-09-22 DIAGNOSIS — F3289 Other specified depressive episodes: Secondary | ICD-10-CM

## 2018-09-22 DIAGNOSIS — M62838 Other muscle spasm: Secondary | ICD-10-CM

## 2018-09-22 DIAGNOSIS — M1712 Unilateral primary osteoarthritis, left knee: Secondary | ICD-10-CM

## 2018-09-22 DIAGNOSIS — M5416 Radiculopathy, lumbar region: Secondary | ICD-10-CM

## 2018-09-22 DIAGNOSIS — F0781 Postconcussional syndrome: Secondary | ICD-10-CM | POA: Diagnosis not present

## 2018-09-22 DIAGNOSIS — M47816 Spondylosis without myelopathy or radiculopathy, lumbar region: Secondary | ICD-10-CM

## 2018-09-22 DIAGNOSIS — Z5181 Encounter for therapeutic drug level monitoring: Secondary | ICD-10-CM

## 2018-09-22 MED ORDER — METHYLPHENIDATE HCL 10 MG PO TABS: 10.0000 mg | ORAL_TABLET | Freq: Two times a day (BID) | ORAL | 0 refills | Status: DC

## 2018-09-22 MED ORDER — HYDROCODONE-ACETAMINOPHEN 10-325 MG PO TABS: 1.0000 | ORAL_TABLET | Freq: Four times a day (QID) | ORAL | 0 refills | Status: DC | PRN

## 2018-09-22 NOTE — Progress Notes (Signed)
Subjective:    Patient ID: [name], female    DOB: 1956/06/03, 62 y.o.   MRN: [id number]  HPI: [name] is a 62 y.o. female who returns for follow up appointment for chronic pain and medication refill. She states her pain is located in her lower back radiating into her left buttock and left lower extremity. Also reports left knee pain, she requested left knee injection. This will be scheduled with Dr. Naaman Plummer. . She rates her  pain 10. Her  current exercise regime is walking.  Ms. Frances Morphine equivalent is 40.00 MME. She is also prescribed Alprazolam.We have discussed the black box warning of using opioids and benzodiazepines.   Last UDS was Performed on 03/01/2018, it was consistent.   Pain Inventory Average Pain 10 Pain Right Now 10 My pain is sharp, burning, dull, stabbing, tingling and aching  In the last 24 hours, has pain interfered with the following? General activity 10 Relation with others 10 Enjoyment of life 10 What TIME of day is your pain at its worst? all Sleep (in general) Poor  Pain is worse with: walking, sitting, inactivity, standing, unsure and some activites Pain improves with: rest, heat/ice, pacing activities, medication and injections Relief from Meds: 9  Mobility walk with assistance use a cane how many minutes can you walk? a few ability to climb steps?  yes do you drive?  yes use a wheelchair  Function disabled: date disabled 02/2014 I need assistance with the following:  dressing, meal prep, household duties and shopping  Neuro/Psych bladder control problems weakness numbness tremor tingling trouble walking spasms dizziness confusion depression anxiety loss of taste or smell  Prior Studies Any changes since last visit?  yes  Physicians involved in your care Any changes since last visit?  yes Primary care Renato Shin and Hennie Duos   Family History  Problem Relation Age of Onset  . Cervical cancer Mother   .  Diabetes Mother   . Stroke Mother   . Alzheimer's disease Mother   . Heart attack Father   . Stroke Father   . Colon cancer Father   . Anxiety disorder Maternal Aunt   . Depression Maternal Aunt   . Anxiety disorder Maternal Uncle   . Depression Maternal Uncle        suicide attempt by gun shot wound to head. Survived  . Alcohol abuse Other   . Drug abuse Other   . Breast cancer Maternal Grandmother   . Diabetes Maternal Grandmother   . Breast cancer Paternal Aunt   . Colon polyps Sister        x 2   Social History   Socioeconomic History  . Marital status: Married    Spouse name: Not on file  . Number of children: 0  . Years of education: Not on file  . Highest education level: Not on file  Occupational History  . Occupation: disabled  Social Needs  . Financial resource strain: Not on file  . Food insecurity:    Worry: Not on file    Inability: Not on file  . Transportation needs:    Medical: Not on file    Non-medical: Not on file  Tobacco Use  . Smoking status: Never Smoker  . Smokeless tobacco: Never Used  Substance and Sexual Activity  . Alcohol use: No    Alcohol/week: 0.0 standard drinks  . Drug use: No  . Sexual activity: Yes  Lifestyle  . Physical activity:    Days  per week: Not on file    Minutes per session: Not on file  . Stress: Not on file  Relationships  . Social connections:    Talks on phone: Not on file    Gets together: Not on file    Attends religious service: Not on file    Active member of club or organization: Not on file    Attends meetings of clubs or organizations: Not on file    Relationship status: Not on file  Other Topics Concern  . Not on file  Social History Narrative   Pt has h.s. degree   Past Surgical History:  Procedure Laterality Date  . ABDOMINAL HYSTERECTOMY  1999  . bunions removed  1988  . C4 C5 cage insertion  06/28/2013  . CHOLECYSTECTOMY  04/25/2008  . EDG  12/17/2004  . ELECTROCARDIOGRAM  05/27/2007  . L5  S1 rod insertion  11/07/2012  . SKIN CANCER EXCISION    . SKIN CANCER EXCISION     several  . TONSILLECTOMY  1981  . TUBAL LIGATION  1997   Past Medical History:  Diagnosis Date  . Anemia   . ANXIETY 06/10/2007  . Chronic headaches   . Degenerative arthritis of spine    back and neck  . Depression   . DYSLIPIDEMIA 06/28/2008  . Esophageal stricture   . Fibromyalgia   . Gallstones   . GERD 06/10/2007  . Hiatal hernia   . 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 HYPERTENSION 06/10/2007  . Irritable bowel syndrome 06/28/2008  . OSTEOARTHRITIS 06/10/2007  . Skin cancer    basal cell  . Tubular adenoma of colon    BP (!) 144/83   Pulse 99   Ht 5\' 1"  (1.549 m)   Wt 163 lb (73.9 kg)   SpO2 98%   BMI 30.80 kg/m   Opioid Risk Score:   Fall Risk Score:  `1  Depression screen PHQ 2/9  Depression screen [facility] 2/9 09/22/2018 09/20/2018 03/28/2018 01/25/2018 11/09/2017 10/08/2017 08/09/2017  Decreased Interest 1 1 3 3 1  - 1  Down, Depressed, Hopeless 1 1 3 2 1  - 1  PHQ - 2 Score 2 2 6 5 2  - 2  Altered sleeping - 1 - 0 - 1 -  Tired, decreased energy - 1 - 3 - 1 -  Change in appetite - 1 - 1 - - -  Feeling bad or failure about yourself  - 1 - 1 - - -  Trouble concentrating - 1 - 2 - - -  Moving slowly or fidgety/restless - 1 - 0 - - -  Suicidal thoughts - 0 - 0 - - -  PHQ-9 Score - 8 - 12 - - -  Difficult doing work/chores - Extremely dIfficult - - - - -  Some recent data might be hidden    Review of Systems  Constitutional: Positive for appetite change, diaphoresis and unexpected weight change.  HENT: Negative.   Eyes: Negative.   Respiratory: Positive for cough and shortness of breath.   Gastrointestinal: Positive for abdominal pain and diarrhea.  Endocrine: Negative.   Genitourinary: Negative.   Musculoskeletal: Negative.   Skin: Positive for rash.  Allergic/Immunologic: Negative.   Neurological: Negative.   Hematological: Negative.   Psychiatric/Behavioral: Negative.   All other systems  reviewed and are negative.      Objective:   Physical Exam Vitals signs and nursing note reviewed.  Constitutional:      Appearance: Normal appearance.  Neck:  Musculoskeletal: Normal range of motion.  Cardiovascular:     Rate and Rhythm: Normal rate and regular rhythm.     Pulses: Normal pulses.     Heart sounds: Normal heart sounds.  Musculoskeletal:     Comments: Normal Muscle Bulk and Muscle Testing Reveals:  Upper Extremities: Full  ROM and Muscle Strength 5/5 Lumbar Paraspinal Tenderness: L-3-L-5  Lower Extremities: Full ROM and Muscle Strength 5/5 Arises from chair with ease Narrow Based Gait   Skin:    General: Skin is warm and dry.  Neurological:     Mental Status: She is alert and oriented to person, place, and time.  Psychiatric:        Mood and Affect: Mood normal.        Behavior: Behavior normal.           Assessment & Plan:  1. History of chronic lumbar facet disease with spondylosis/DDD/ L4-5 spondylolisthesis.With L4-5 L5-S1 decompression stabilization: 09/22/2018 Continue: Hydrocodone 10/325mg  one tablet every 6 hours as needed #120.  We will continue the opioid monitoring program, this consists of regular clinic visits, examinations, urine drug screen, pill counts as well as use of [place] Controlled Substance Reporting System. 2. Depression with anxiety/ PTSD: Dr. Sima Matas Following. 09/22/2018.Continue Xanax, and Effexor. 3. Cervical spondylosis: Stable at this time with no complaints. Continue Medication Regime. 09/22/2018 4. 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 Continue Ritalin 10 mg one tablet twice a day 0700 and noon #60. Dr. Naaman Plummer note was reviewed, we discuss the slow titration in the fall, she verbalizes understanding.Second script sent to accommodate schedule appointment. 09/22/2018 5. Muscle Spasm: Continuecurrent medication regimen withFlexeril: May take 1-2 tablets at HS.  09/22/2018 6. Left Lumbar Radiculitis: Continue Gabapentin. Continue to Monitor. 09/22/18 7. Left Knee Osteoarthritis: Schedule for Left Knee Injection with Dr. Naaman Plummer.   F/U in 1 month

## 2018-09-24 LAB — PTH, INTACT AND CALCIUM: Calcium: 9.6 mg/dL (ref 8.7–10.3)

## 2018-09-26 ENCOUNTER — Encounter: Payer: 59 | Admitting: Registered Nurse

## 2018-09-29 ENCOUNTER — Ambulatory Visit: Payer: 59 | Admitting: Psychology

## 2018-09-29 ENCOUNTER — Encounter

## 2018-10-07 ENCOUNTER — Encounter: Payer: Self-pay | Admitting: *Deleted

## 2018-10-18 NOTE — Telephone Encounter (Signed)
Message from patient, confirmed in PMP that patient picked up medication on 09-22-2018

## 2018-10-24 DIAGNOSIS — F418 Other specified anxiety disorders: Secondary | ICD-10-CM | POA: Diagnosis not present

## 2018-10-24 DIAGNOSIS — K219 Gastro-esophageal reflux disease without esophagitis: Secondary | ICD-10-CM | POA: Diagnosis not present

## 2018-10-26 ENCOUNTER — Encounter
Payer: BLUE CROSS/BLUE SHIELD | Attending: Physical Medicine and Rehabilitation | Admitting: Physical Medicine & Rehabilitation

## 2018-10-26 ENCOUNTER — Encounter: Payer: Self-pay | Admitting: Physical Medicine & Rehabilitation

## 2018-10-26 ENCOUNTER — Other Ambulatory Visit: Payer: Self-pay

## 2018-10-26 VITALS — BP 120/79 | HR 98 | Ht 61.0 in | Wt 162.0 lb

## 2018-10-26 DIAGNOSIS — M1712 Unilateral primary osteoarthritis, left knee: Secondary | ICD-10-CM

## 2018-10-26 DIAGNOSIS — M461 Sacroiliitis, not elsewhere classified: Secondary | ICD-10-CM | POA: Diagnosis not present

## 2018-10-26 DIAGNOSIS — G894 Chronic pain syndrome: Secondary | ICD-10-CM

## 2018-10-26 DIAGNOSIS — M47817 Spondylosis without myelopathy or radiculopathy, lumbosacral region: Secondary | ICD-10-CM | POA: Insufficient documentation

## 2018-10-26 DIAGNOSIS — F320 Major depressive disorder, single episode, mild: Secondary | ICD-10-CM | POA: Diagnosis not present

## 2018-10-26 DIAGNOSIS — F411 Generalized anxiety disorder: Secondary | ICD-10-CM | POA: Diagnosis not present

## 2018-10-26 DIAGNOSIS — F0781 Postconcussional syndrome: Secondary | ICD-10-CM | POA: Diagnosis not present

## 2018-10-26 MED ORDER — HYDROCODONE-ACETAMINOPHEN 10-325 MG PO TABS: 1.0000 | ORAL_TABLET | Freq: Four times a day (QID) | ORAL | 0 refills | Status: DC | PRN

## 2018-10-26 MED ORDER — METHYLPHENIDATE HCL 10 MG PO TABS: 10.0000 mg | ORAL_TABLET | Freq: Two times a day (BID) | ORAL | 0 refills | Status: DC

## 2018-10-26 MED ORDER — VENLAFAXINE HCL ER 225 MG PO TB24: 225.0000 mg | ORAL_TABLET | Freq: Every day | ORAL | 3 refills | Status: DC

## 2018-10-26 NOTE — Patient Instructions (Signed)
PLEASE FEEL FREE TO CALL OUR OFFICE WITH ANY PROBLEMS OR QUESTIONS ([phone])      

## 2018-10-26 NOTE — Progress Notes (Signed)
PROCEDURE NOTE  DIAGNOSIS: OA left knee  INTERVENTION:       After informed consent and preparation of the skin with betadine and isopropyl alcohol, I injected 6mg  (1cc) of celestone and 4cc of 1% lidocaine into the left knee via anterolateral approach. Additionally, aspiration was performed prior to injection. The patient tolerated well, and no complications were encountered. Afterward the area was cleaned and dressed. Post- injection instructions were provided.   Increased venlafaxine to 225 mg daily.   Refilled hydrocodone and ritalin today  Meredith Staggers, MD, [facility] 10/26/2018

## 2018-10-27 ENCOUNTER — Other Ambulatory Visit: Payer: Self-pay | Admitting: Physical Medicine & Rehabilitation

## 2018-10-28 ENCOUNTER — Other Ambulatory Visit: Payer: Self-pay

## 2018-10-28 MED ORDER — ALPRAZOLAM 1 MG PO TABS: ORAL_TABLET | ORAL | 1 refills | Status: DC

## 2018-10-28 NOTE — Telephone Encounter (Signed)
Responded back to patient about medication refill. Refilled medication via office protocol.

## 2018-11-03 ENCOUNTER — Ambulatory Visit
Admission: RE | Admit: 2018-11-03 | Discharge: 2018-11-03 | Disposition: A | Discharge status: Home / Self Care | Payer: BLUE CROSS/BLUE SHIELD | Source: Ambulatory Visit | Attending: Family Medicine | Admitting: Family Medicine

## 2018-11-03 DIAGNOSIS — Z1231 Encounter for screening mammogram for malignant neoplasm of breast: Secondary | ICD-10-CM | POA: Diagnosis not present

## 2018-11-03 DIAGNOSIS — Z1239 Encounter for other screening for malignant neoplasm of breast: Secondary | ICD-10-CM

## 2018-11-15 DIAGNOSIS — G894 Chronic pain syndrome: Secondary | ICD-10-CM | POA: Diagnosis not present

## 2018-11-15 DIAGNOSIS — K219 Gastro-esophageal reflux disease without esophagitis: Secondary | ICD-10-CM | POA: Diagnosis not present

## 2018-11-17 ENCOUNTER — Encounter: Payer: BLUE CROSS/BLUE SHIELD | Admitting: Registered Nurse

## 2018-11-17 ENCOUNTER — Encounter: Payer: BLUE CROSS/BLUE SHIELD | Admitting: Psychology

## 2018-11-23 ENCOUNTER — Telehealth: Payer: Self-pay | Admitting: *Deleted

## 2018-11-23 ENCOUNTER — Other Ambulatory Visit: Payer: Self-pay | Admitting: *Deleted

## 2018-11-23 MED ORDER — LURASIDONE HCL 20 MG PO TABS: 20.0000 mg | ORAL_TABLET | Freq: Every day | ORAL | 2 refills | Status: DC

## 2018-11-23 NOTE — Telephone Encounter (Signed)
Prior authorization submitted to covermymeds for Latuda 20 mg. Approved

## 2018-11-24 ENCOUNTER — Ambulatory Visit: Payer: 59 | Admitting: Registered Nurse

## 2018-11-26 ENCOUNTER — Other Ambulatory Visit: Payer: Self-pay | Admitting: Physical Medicine & Rehabilitation

## 2018-11-29 ENCOUNTER — Encounter: Payer: Self-pay | Admitting: Internal Medicine

## 2018-11-30 DIAGNOSIS — R12 Heartburn: Secondary | ICD-10-CM | POA: Diagnosis not present

## 2018-11-30 DIAGNOSIS — Z86010 Personal history of colonic polyps: Secondary | ICD-10-CM | POA: Diagnosis not present

## 2018-11-30 DIAGNOSIS — K640 First degree hemorrhoids: Secondary | ICD-10-CM | POA: Diagnosis not present

## 2018-11-30 DIAGNOSIS — K219 Gastro-esophageal reflux disease without esophagitis: Secondary | ICD-10-CM | POA: Diagnosis not present

## 2018-12-06 ENCOUNTER — Encounter: Payer: BLUE CROSS/BLUE SHIELD | Attending: Physical Medicine and Rehabilitation | Admitting: Registered Nurse

## 2018-12-06 ENCOUNTER — Other Ambulatory Visit: Payer: Self-pay

## 2018-12-06 ENCOUNTER — Encounter: Payer: Self-pay | Admitting: Registered Nurse

## 2018-12-06 VITALS — BP 120/82 | HR 96 | Ht 61.0 in | Wt 162.0 lb

## 2018-12-06 DIAGNOSIS — G894 Chronic pain syndrome: Secondary | ICD-10-CM

## 2018-12-06 DIAGNOSIS — M5416 Radiculopathy, lumbar region: Secondary | ICD-10-CM

## 2018-12-06 DIAGNOSIS — Z79891 Long term (current) use of opiate analgesic: Secondary | ICD-10-CM

## 2018-12-06 DIAGNOSIS — F3289 Other specified depressive episodes: Secondary | ICD-10-CM

## 2018-12-06 DIAGNOSIS — M461 Sacroiliitis, not elsewhere classified: Secondary | ICD-10-CM | POA: Diagnosis not present

## 2018-12-06 DIAGNOSIS — M961 Postlaminectomy syndrome, not elsewhere classified: Secondary | ICD-10-CM | POA: Diagnosis not present

## 2018-12-06 DIAGNOSIS — M47816 Spondylosis without myelopathy or radiculopathy, lumbar region: Secondary | ICD-10-CM | POA: Diagnosis not present

## 2018-12-06 DIAGNOSIS — Z5181 Encounter for therapeutic drug level monitoring: Secondary | ICD-10-CM

## 2018-12-06 DIAGNOSIS — F0781 Postconcussional syndrome: Secondary | ICD-10-CM

## 2018-12-06 DIAGNOSIS — M62838 Other muscle spasm: Secondary | ICD-10-CM

## 2018-12-06 DIAGNOSIS — M47817 Spondylosis without myelopathy or radiculopathy, lumbosacral region: Secondary | ICD-10-CM

## 2018-12-06 DIAGNOSIS — F411 Generalized anxiety disorder: Secondary | ICD-10-CM | POA: Diagnosis not present

## 2018-12-06 DIAGNOSIS — F4310 Post-traumatic stress disorder, unspecified: Secondary | ICD-10-CM

## 2018-12-06 MED ORDER — METHYLPHENIDATE HCL 10 MG PO TABS: 10.0000 mg | ORAL_TABLET | Freq: Two times a day (BID) | ORAL | 0 refills | Status: DC

## 2018-12-06 MED ORDER — HYDROCODONE-ACETAMINOPHEN 10-325 MG PO TABS: 1.0000 | ORAL_TABLET | Freq: Four times a day (QID) | ORAL | 0 refills | Status: DC | PRN

## 2018-12-06 NOTE — Progress Notes (Signed)
Subjective:    Patient ID: [name], female    DOB: December 20, 1955, 63 y.o.   MRN: [id number]  HPI: [name] is a 63 y.o. female who returns for follow up appointment for chronic pain and medication refill. She states her pain is located in her lower back radiating into her left lower extremity. She rates her pain 9. Her current exercise regime is walking.   S/P Left knee injection with relief noted.   Ms. Vary Morphine equivalent is 40.00 MME.  Last UDS was Performed on 03/01/2018, it was consistent.   Pain Inventory Average Pain 9 Pain Right Now 9 My pain is sharp, burning, dull, stabbing and aching  In the last 24 hours, has pain interfered with the following? General activity 10 Relation with others 10 Enjoyment of life 10 What TIME of day is your pain at its worst? morning daytime evening Sleep (in general) Fair  Pain is worse with: walking, sitting, inactivity, standing, unsure and some activites Pain improves with: rest and medication Relief from Meds: 6  Mobility use a walker how many minutes can you walk? few ability to climb steps?  yes do you drive?  yes  Function disabled: date disabled 02/2014 I need assistance with the following:  dressing, meal prep, household duties and shopping Do you have any goals in this area?  yes  Neuro/Psych bladder control problems weakness numbness tremor trouble walking spasms dizziness confusion depression anxiety loss of taste or smell  Prior Studies Any changes since last visit?  yes colonoscopy and endoscopy  Physicians involved in your care Gastroenterologist- Dr. Laurence Spates   Family History  Problem Relation Age of Onset  . Cervical cancer Mother   . Diabetes Mother   . Stroke Mother   . Alzheimer's disease Mother   . Heart attack Father   . Stroke Father   . Colon cancer Father   . Anxiety disorder Maternal Aunt   . Depression Maternal Aunt   . Anxiety disorder Maternal Uncle     . Depression Maternal Uncle        suicide attempt by gun shot wound to head. Survived  . Alcohol abuse Other   . Drug abuse Other   . Breast cancer Maternal Grandmother   . Diabetes Maternal Grandmother   . Breast cancer Paternal Aunt   . Colon polyps Sister        x 2   Social History   Socioeconomic History  . Marital status: Married    Spouse name: Not on file  . Number of children: 0  . Years of education: Not on file  . Highest education level: Not on file  Occupational History  . Occupation: disabled  Social Needs  . Financial resource strain: Not on file  . Food insecurity:    Worry: Not on file    Inability: Not on file  . Transportation needs:    Medical: Not on file    Non-medical: Not on file  Tobacco Use  . Smoking status: Never Smoker  . Smokeless tobacco: Never Used  Substance and Sexual Activity  . Alcohol use: No    Alcohol/week: 0.0 standard drinks  . Drug use: No  . Sexual activity: Yes  Lifestyle  . Physical activity:    Days per week: Not on file    Minutes per session: Not on file  . Stress: Not on file  Relationships  . Social connections:    Talks on phone: Not on file  Gets together: Not on file    Attends religious service: Not on file    Active member of club or organization: Not on file    Attends meetings of clubs or organizations: Not on file    Relationship status: Not on file  Other Topics Concern  . Not on file  Social History Narrative   Pt has h.s. degree   Past Surgical History:  Procedure Laterality Date  . ABDOMINAL HYSTERECTOMY  1999  . bunions removed  1988  . C4 C5 cage insertion  06/28/2013  . CHOLECYSTECTOMY  04/25/2008  . EDG  12/17/2004  . ELECTROCARDIOGRAM  05/27/2007  . L5 S1 rod insertion  11/07/2012  . SKIN CANCER EXCISION    . SKIN CANCER EXCISION     several  . TONSILLECTOMY  1981  . TUBAL LIGATION  1997   Past Medical History:  Diagnosis Date  . Anemia   . ANXIETY 06/10/2007  . Chronic headaches    . Degenerative arthritis of spine    back and neck  . Depression   . DYSLIPIDEMIA 06/28/2008  . Esophageal stricture   . Fibromyalgia   . Gallstones   . GERD 06/10/2007  . Hiatal hernia   . 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 HYPERTENSION 06/10/2007  . Irritable bowel syndrome 06/28/2008  . OSTEOARTHRITIS 06/10/2007  . Skin cancer    basal cell  . Tubular adenoma of colon    BP 120/82   Pulse 96   Ht 5\' 1"  (1.549 m)   Wt 162 lb (73.5 kg)   SpO2 96%   BMI 30.61 kg/m   Opioid Risk Score:   Fall Risk Score:  `1  Depression screen PHQ 2/9  Depression screen [facility] 2/9 12/06/2018 09/22/2018 09/20/2018 03/28/2018 01/25/2018 11/09/2017 10/08/2017  Decreased Interest 1 1 1 3 3 1  -  Down, Depressed, Hopeless 1 1 1 3 2 1  -  PHQ - 2 Score 2 2 2 6 5 2  -  Altered sleeping - - 1 - 0 - 1  Tired, decreased energy - - 1 - 3 - 1  Change in appetite - - 1 - 1 - -  Feeling bad or failure about yourself  - - 1 - 1 - -  Trouble concentrating - - 1 - 2 - -  Moving slowly or fidgety/restless - - 1 - 0 - -  Suicidal thoughts - - 0 - 0 - -  PHQ-9 Score - - 8 - 12 - -  Difficult doing work/chores - - Extremely dIfficult - - - -  Some recent data might be hidden    Review of Systems  Constitutional: Positive for appetite change, diaphoresis and unexpected weight change.  HENT: Negative.   Eyes: Negative.   Respiratory: Negative.   Cardiovascular: Negative.   Gastrointestinal: Positive for abdominal pain, constipation, diarrhea and nausea.  Endocrine: Negative.   Genitourinary: Negative.   Musculoskeletal: Negative.   Skin: Negative.   Allergic/Immunologic: Negative.   Neurological: Negative.   Hematological: Negative.   Psychiatric/Behavioral: Negative.   All other systems reviewed and are negative.      Objective:   Physical Exam Vitals signs and nursing note reviewed.  Constitutional:      Appearance: Normal appearance.  Neck:     Musculoskeletal: Normal range of motion and neck supple.   Cardiovascular:     Rate and Rhythm: Normal rate and regular rhythm.  Pulmonary:     Effort: Pulmonary effort is normal.     Breath sounds: Normal  breath sounds.  Musculoskeletal:     Comments: Normal Muscle Bulk and Muscle Testing Reveals:  Upper Extremities: FullROM and Muscle Strength 5/5 , Lumbar Paraspinal Tenderness: L-4-L-5 Lower Extremities: Full ROM and Muscle Strength 5/5 Arises from Chair with ease Narrow Based Gait   Skin:    General: Skin is warm and dry.  Neurological:     Mental Status: She is alert and oriented to person, place, and time.  Psychiatric:        Mood and Affect: Mood normal.        Behavior: Behavior normal.           Assessment & Plan:  1. History of chronic lumbar facet disease with spondylosis/DDD/ L4-5 spondylolisthesis.With L4-5 L5-S1 decompression stabilization:12/06/2018 Continue: Hydrocodone 10/325mg  one tablet every 6 hours as needed #120.  We will continue the opioid monitoring program, this consists of regular clinic visits, examinations, urine drug screen, pill counts as well as use of [place] Controlled Substance Reporting System. 2. Depression with anxiety/ PTSD: Dr. Sima Matas Following. 12/06/2018.Continue Xanax, and Effexor. 3. Cervical spondylosis: Stable at this time with no complaints. Continue Medication Regime.12/06/2018 4. 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 Continue Ritalin 10 mg one tablet twice a day 0700 and noon #60. Dr. Naaman Plummer note was reviewed, we discuss the slow titration in the fall, she verbalizes understanding. 12/06/2018 5. Muscle Spasm: Continuecurrent medication regimen withFlexeril: May take 1-2 tablets at HS. 12/06/2018 6. Left Lumbar Radiculitis: Continue Gabapentin. Continue to Monitor. 12/06/2018 7. Left Knee Osteoarthritis: S/P Left Knee Injection with good relief noted.12/06/2018  F/U in 1 month

## 2018-12-07 ENCOUNTER — Other Ambulatory Visit: Payer: Self-pay | Admitting: *Deleted

## 2018-12-11 ENCOUNTER — Other Ambulatory Visit: Payer: Self-pay | Admitting: Endocrinology

## 2018-12-11 NOTE — Telephone Encounter (Signed)
Please forward refill request to pt's new primary care provider.  

## 2018-12-14 ENCOUNTER — Other Ambulatory Visit: Payer: Self-pay | Admitting: *Deleted

## 2018-12-14 MED ORDER — IRBESARTAN-HYDROCHLOROTHIAZIDE 150-12.5 MG PO TABS: 0.5000 | ORAL_TABLET | Freq: Every day | ORAL | 1 refills | Status: DC

## 2018-12-22 ENCOUNTER — Encounter: Payer: BLUE CROSS/BLUE SHIELD | Attending: Physical Medicine and Rehabilitation | Admitting: Psychology

## 2018-12-22 ENCOUNTER — Other Ambulatory Visit: Payer: Self-pay

## 2018-12-22 DIAGNOSIS — M961 Postlaminectomy syndrome, not elsewhere classified: Secondary | ICD-10-CM | POA: Diagnosis not present

## 2018-12-22 DIAGNOSIS — M461 Sacroiliitis, not elsewhere classified: Secondary | ICD-10-CM | POA: Insufficient documentation

## 2018-12-22 DIAGNOSIS — G894 Chronic pain syndrome: Secondary | ICD-10-CM

## 2018-12-22 DIAGNOSIS — F411 Generalized anxiety disorder: Secondary | ICD-10-CM | POA: Diagnosis not present

## 2018-12-22 DIAGNOSIS — F0781 Postconcussional syndrome: Secondary | ICD-10-CM

## 2018-12-22 DIAGNOSIS — F4310 Post-traumatic stress disorder, unspecified: Secondary | ICD-10-CM

## 2018-12-22 DIAGNOSIS — M47817 Spondylosis without myelopathy or radiculopathy, lumbosacral region: Secondary | ICD-10-CM | POA: Diagnosis not present

## 2018-12-25 ENCOUNTER — Encounter: Payer: Self-pay | Admitting: Psychology

## 2018-12-25 NOTE — Progress Notes (Signed)
Patient:                           [name]           DOB:                               08-30-1956  MR Number:                  [id number]  Location:                        [facility] [address], [place] [id number] [place] [address] Dept: [phone]  Start:  1 PM End:                                2 PM  Provider/Observer:                           Edgardo Roys PSYD  Chief Complaint:                                   Chief Complaint  Patient presents with  . Post-Traumatic Stress Disorder  . Pain  . Depression  . Memory Loss  . Anxiety    Reason For Service:                         [name] is a 63 year old female referred by Dr. Naaman Plummer due to issues of coping and dealing with a number of stressors. She has a history of postconcussion syndrome, chronic pain syndrome as well as PTSD.  The patient has been dealing with significant anxiety and depression along with a history of PTSD and postconcussion syndrome. The patient was referred for psychotherapeutic interventions primarily because of the issues of depression and coping with family stress.  The above reason for service was reviewed and remains accurate.  The patient reports that there has been significant improvements in psychosocial issues but there continues to be a lot of stress particularly around issues related to her mother's dementia and the mother not recognizing the patient.   The above reason for service has been reviewed and remains applicable for the current visit.  The patient reports that many of the psychosocial stressors have been improving although her mother is at the end stage of Alzheimer's dementia.  The patient reports that she is continuing to improve her overall problem solving and coping and reducing the overall stress in her life.  Interventions Strategy:                     Cognitive/behavioral psychotherapeutic interventions  Participation Level:                           The patient was appropriate and active in the current visit today and was well oriented and had good mental status/orientation.  Participation Quality:                       Appropriate and attentive  Behavioral Observation:                  The patient was well-groomed, alert and appropriate in her interactions.  Current Psychosocial Factors:        T the patient reports that she is continuing to increase her coping and managing with regard to issues related to her mother being in the last stages of dementia of the Alzheimer's type.  The patient's mother is likely to pass away soon and is having increasing physical and cognitive deficits.  The patient reports that while it is very stressful seeing her mother deteriorate this much the length of time that this deterioration is taken place has allowed her to adjust to the reality.  Content of Session:                           Reviewed current symptoms and continue to work on therapeutic issues around pain, PTSD, and depression/anxiety.  Current Status:                                   T the patient reports that her coping skills and strategies have continued to improve and that she is doing much better as far as her interactions.  She reports that her anxiety and depressive symptoms have been improving.  Patient Progress:                               The patient continues to show improvement in her overall functioning.  Impression/Diagnosis:                     [name] is a 63 year old female referred by Dr. Naaman Plummer due to issues of coping and dealing with a number of stressors. She has a history of postconcussion syndrome, chronic pain syndrome as well as PTSD. The patient describes significant difficulty with issues related to her family and the family estate. There were issues when her father passed away where an  older sister was in charge of everything and all of his possessions were been ago to this older sister. However, the middle sister began having trouble and when the patient could not help the sister rejected the patient. There've been a number of family conflicts going on including a niece who got in trouble with doing drugs and her excuse to the patient's sister was that the patient was the one who started her doing drugs and giving her money. This is not accurate.  The above impression/diagnostic considerations have been reviewed and remains applicable and accurate for the current visit.   Diagnosis:   Lumbar post-laminectomy syndrome  Post concussion syndrome  PTSD (post-traumatic stress disorder)  GAD (generalized anxiety disorder)  Chronic pain syndrome

## 2019-01-03 ENCOUNTER — Encounter: Payer: Self-pay | Admitting: Registered Nurse

## 2019-01-03 ENCOUNTER — Encounter: Payer: BLUE CROSS/BLUE SHIELD | Admitting: Registered Nurse

## 2019-01-03 ENCOUNTER — Other Ambulatory Visit: Payer: Self-pay

## 2019-01-03 VITALS — BP 127/83 | HR 105 | Temp 98.1°F | Ht 61.0 in | Wt 163.0 lb

## 2019-01-03 DIAGNOSIS — M5416 Radiculopathy, lumbar region: Secondary | ICD-10-CM

## 2019-01-03 DIAGNOSIS — Z79899 Other long term (current) drug therapy: Secondary | ICD-10-CM

## 2019-01-03 DIAGNOSIS — G894 Chronic pain syndrome: Secondary | ICD-10-CM

## 2019-01-03 DIAGNOSIS — M961 Postlaminectomy syndrome, not elsewhere classified: Secondary | ICD-10-CM | POA: Diagnosis not present

## 2019-01-03 DIAGNOSIS — M47816 Spondylosis without myelopathy or radiculopathy, lumbar region: Secondary | ICD-10-CM

## 2019-01-03 DIAGNOSIS — M461 Sacroiliitis, not elsewhere classified: Secondary | ICD-10-CM | POA: Diagnosis not present

## 2019-01-03 DIAGNOSIS — F3289 Other specified depressive episodes: Secondary | ICD-10-CM

## 2019-01-03 DIAGNOSIS — Z5181 Encounter for therapeutic drug level monitoring: Secondary | ICD-10-CM | POA: Diagnosis not present

## 2019-01-03 DIAGNOSIS — F4310 Post-traumatic stress disorder, unspecified: Secondary | ICD-10-CM

## 2019-01-03 DIAGNOSIS — F411 Generalized anxiety disorder: Secondary | ICD-10-CM

## 2019-01-03 DIAGNOSIS — M62838 Other muscle spasm: Secondary | ICD-10-CM

## 2019-01-03 DIAGNOSIS — M47817 Spondylosis without myelopathy or radiculopathy, lumbosacral region: Secondary | ICD-10-CM | POA: Diagnosis not present

## 2019-01-03 DIAGNOSIS — F0781 Postconcussional syndrome: Secondary | ICD-10-CM

## 2019-01-03 DIAGNOSIS — M7581 Other shoulder lesions, right shoulder: Secondary | ICD-10-CM

## 2019-01-03 DIAGNOSIS — M778 Other enthesopathies, not elsewhere classified: Secondary | ICD-10-CM

## 2019-01-03 MED ORDER — HYDROCODONE-ACETAMINOPHEN 10-325 MG PO TABS: 1.0000 | ORAL_TABLET | Freq: Four times a day (QID) | ORAL | 0 refills | Status: DC | PRN

## 2019-01-03 MED ORDER — METHYLPHENIDATE HCL 10 MG PO TABS: 10.0000 mg | ORAL_TABLET | Freq: Two times a day (BID) | ORAL | 0 refills | Status: DC

## 2019-01-03 NOTE — Patient Instructions (Addendum)
We will begin slow weaning of Latuda due Emotional Burst of Anger:  Take one tablet  every other day. [name] is not scored. Call office or send My chart message with any questions or concerns.   Send me a My Chart message in 2 weeks to evaluate medication management.

## 2019-01-03 NOTE — Progress Notes (Deleted)
Subjective:    Patient ID: [name], female    DOB: 27-Jan-1956, 63 y.o.   MRN: [id number]  HPI: [name] is a 63 y.o. female who returns for follow up appointment for chronic pain and medication refill. She states her pain is located in her right shoulder pain( she denies falling), reports she noticed the pain after doing housecleaning, also reports lower back pain radiating into her left buttock and left lower extremity. She rates her pain 9. Her current exercise regime is walking.  [name] Morphine equivalent is 40.00 MME. She is also prescribed Alprazolam.We have discussed the black box warning of using opioids and benzodiazepines. I highlighted the dangers of using these drugs together and discussed the adverse events including respiratory suppression, overdose, cognitive impairment and importance of compliance with current regimen. We will continue to monitor and adjust as indicated.   Last UDS was Performed on 03/01/2018, it was consistent. Oral Swab was Performed today.    Pain Inventory Average Pain 9 Pain Right Now 9 My pain is intermittent, constant, sharp, burning, dull, stabbing, tingling and aching  In the last 24 hours, has pain interfered with the following? General activity 10 Relation with others 10 Enjoyment of life 10 What TIME of day is your pain at its worst? morning Sleep (in general) Fair  Pain is worse with: walking, sitting, inactivity, standing, unsure and some activites Pain improves with: rest, heat/ice, pacing activities and medication Relief from Meds: 6  Mobility walk without assistance use a cane ability to climb steps?  yes do you drive?  yes  Function disabled: date disabled 2015 I need assistance with the following:  dressing, meal prep, household duties and shopping  Neuro/Psych bladder control problems weakness numbness tremor tingling trouble walking spasms dizziness confusion depression anxiety loss of  taste or smell  Prior Studies Any changes since last visit?  no  Physicians involved in your care Any changes since last visit?  no   Family History  Problem Relation Age of Onset  . Cervical cancer Mother   . Diabetes Mother   . Stroke Mother   . Alzheimer's disease Mother   . Heart attack Father   . Stroke Father   . Colon cancer Father   . Anxiety disorder Maternal Aunt   . Depression Maternal Aunt   . Anxiety disorder Maternal Uncle   . Depression Maternal Uncle        suicide attempt by gun shot wound to head. Survived  . Alcohol abuse Other   . Drug abuse Other   . Breast cancer Maternal Grandmother   . Diabetes Maternal Grandmother   . Breast cancer Paternal Aunt   . Colon polyps Sister        x 2   Social History   Socioeconomic History  . Marital status: Married    Spouse name: Not on file  . Number of children: 0  . Years of education: Not on file  . Highest education level: Not on file  Occupational History  . Occupation: disabled  Social Needs  . Financial resource strain: Not on file  . Food insecurity:    Worry: Not on file    Inability: Not on file  . Transportation needs:    Medical: Not on file    Non-medical: Not on file  Tobacco Use  . Smoking status: Never Smoker  . Smokeless tobacco: Never Used  Substance and Sexual Activity  . Alcohol use: No  Alcohol/week: 0.0 standard drinks  . Drug use: No  . Sexual activity: Yes  Lifestyle  . Physical activity:    Days per week: Not on file    Minutes per session: Not on file  . Stress: Not on file  Relationships  . Social connections:    Talks on phone: Not on file    Gets together: Not on file    Attends religious service: Not on file    Active member of club or organization: Not on file    Attends meetings of clubs or organizations: Not on file    Relationship status: Not on file  Other Topics Concern  . Not on file  Social History Narrative   Pt has h.s. degree   Past Surgical  History:  Procedure Laterality Date  . ABDOMINAL HYSTERECTOMY  1999  . bunions removed  1988  . C4 C5 cage insertion  06/28/2013  . CHOLECYSTECTOMY  04/25/2008  . EDG  12/17/2004  . ELECTROCARDIOGRAM  05/27/2007  . L5 S1 rod insertion  11/07/2012  . SKIN CANCER EXCISION    . SKIN CANCER EXCISION     several  . TONSILLECTOMY  1981  . TUBAL LIGATION  1997   Past Medical History:  Diagnosis Date  . Anemia   . ANXIETY 06/10/2007  . Chronic headaches   . Degenerative arthritis of spine    back and neck  . Depression   . DYSLIPIDEMIA 06/28/2008  . Esophageal stricture   . Fibromyalgia   . Gallstones   . GERD 06/10/2007  . Hiatal hernia   . 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 HYPERTENSION 06/10/2007  . Irritable bowel syndrome 06/28/2008  . OSTEOARTHRITIS 06/10/2007  . Skin cancer    basal cell  . Tubular adenoma of colon    BP (!) 136/101   Pulse (!) 105   Temp 98.1 F (36.7 C)   Ht 5\' 1"  (1.549 m)   Wt 163 lb (73.9 kg)   SpO2 97%   BMI 30.80 kg/m   Opioid Risk Score:   Fall Risk Score:  `1  Depression screen PHQ 2/9  Depression screen [facility] 2/9 12/06/2018 09/22/2018 09/20/2018 03/28/2018 01/25/2018 11/09/2017 10/08/2017  Decreased Interest 1 1 1 3 3 1  -  Down, Depressed, Hopeless 1 1 1 3 2 1  -  PHQ - 2 Score 2 2 2 6 5 2  -  Altered sleeping - - 1 - 0 - 1  Tired, decreased energy - - 1 - 3 - 1  Change in appetite - - 1 - 1 - -  Feeling bad or failure about yourself  - - 1 - 1 - -  Trouble concentrating - - 1 - 2 - -  Moving slowly or fidgety/restless - - 1 - 0 - -  Suicidal thoughts - - 0 - 0 - -  PHQ-9 Score - - 8 - 12 - -  Difficult doing work/chores - - Extremely dIfficult - - - -  Some recent data might be hidden     Review of Systems  Constitutional: Positive for appetite change and unexpected weight change.  HENT: Negative.   Eyes: Negative.   Respiratory: Positive for cough.   Cardiovascular: Negative.   Gastrointestinal: Positive for abdominal pain and diarrhea.  Endocrine:  Negative.   Genitourinary: Positive for difficulty urinating.  Musculoskeletal: Positive for arthralgias, back pain, gait problem, joint swelling and myalgias.  Skin: Negative.   Allergic/Immunologic: Negative.   Neurological: Positive for dizziness, tremors, weakness and numbness.  Hematological: Bruises/bleeds easily.  Psychiatric/Behavioral: Positive for confusion, decreased concentration and dysphoric mood. The patient is nervous/anxious.   All other systems reviewed and are negative.      Objective:   Physical Exam Vitals signs and nursing note reviewed.  Constitutional:      Appearance: Normal appearance.  Neck:     Musculoskeletal: Normal range of motion and neck supple.  Cardiovascular:     Rate and Rhythm: Normal rate and regular rhythm.     Pulses: Normal pulses.     Heart sounds: Normal heart sounds.  Pulmonary:     Effort: Pulmonary effort is normal.     Breath sounds: Normal breath sounds.  Musculoskeletal:     Comments: Normal Muscle Bulk and Muscle Testing Reveals:  Upper Extremities: Right: Decreased ROM 45 Degrees  and Muscle Strength 4/5   Right AC Joint Tenderness Left: Full ROM and Muscle Strength 5/5 Lumbar Paraspinal Tenderness: L-3-L-5 Lower Extremities: Full ROM and Muscle Strength 5/5 Arises from Table with ease Narrow Based Gait  Skin:    General: Skin is warm and dry.  Neurological:     Mental Status: She is alert and oriented to person, place, and time.           Assessment & Plan:  1. History of chronic lumbar facet disease with spondylosis/DDD/ L4-5 spondylolisthesis.With L4-5 L5-S1 decompression stabilization:12/06/2018 Continue: Hydrocodone 10/325mg  one tablet every 6 hours as needed #120.  We will continue the opioid monitoring program, this consists of regular clinic visits, examinations, urine drug screen, pill counts as well as use of [place] Controlled Substance Reporting System. 2. Depression with anxiety/ PTSD: Dr.  Sima Matas Following. 12/06/2018.Continue Xanax, and Effexor. 3. Cervical spondylosis: Stable at this time with no complaints. Continue Medication Regime.12/06/2018 4. 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 Continue Ritalin 10 mg one tablet twice a day 0700 and noon #60. Dr. Naaman Plummer note was reviewed, we discuss the slow titration in the fall, she verbalizes understanding. 12/06/2018 5. Muscle Spasm: Continuecurrent medication regimen withFlexeril: May take 1-2 tablets at HS. 12/06/2018 6. Left Lumbar Radiculitis: Continue Gabapentin. Continue to Monitor. 12/06/2018 7. Left Knee Osteoarthritis: S/P Left Knee Injection with good relief noted.12/06/2018  F/U in 1 month

## 2019-01-04 NOTE — Progress Notes (Signed)
Subjective:    Patient ID: [name], female    DOB: 1956/01/04, 63 y.o.   MRN: [id number]  HPI: [name] is a 63 y.o. female who returns for follow up appointment for chronic pain and medication refill. She states her pain is located in her right shoulder ( denies falling), lower back pain radiating into her left buttock and left lower extremity. She rates her pain 9. Her current exercise regime is walking.  [name] also reports since being prescribed  Latuda she is experiencing burst of anger, we will began a slow weaning of the [place], instructions given,she verbalizes understanding. Also instructed to call office or send a My-chart message in two weeks for medication management, she verbalizes understanding.   [name] Morphine equivalent is 40.00 MME. She is also prescribed Alprazolam.We have discussed the black box warning of using opioids and benzodiazepines. I highlighted the dangers of using these drugs together and discussed the adverse events including respiratory suppression, overdose, cognitive impairment and importance of compliance with current regimen. We will continue to monitor and adjust as indicated.   Last UDS was Performed on 03/01/2018, it was consistent. Oral Swab was Performed today.     Pain Inventory Average Pain 9 Pain Right Now 9 My pain is intermittent, constant, sharp, burning, dull, stabbing, tingling and aching  In the last 24 hours, has pain interfered with the following? General activity 10 Relation with others 10 Enjoyment of life 10 What TIME of day is your pain at its worst? morning Sleep (in general) Fair  Pain is worse with: walking, sitting, inactivity, standing, unsure and some activites Pain improves with: rest, heat/ice, pacing activities and medication Relief from Meds: 6  Mobility walk without assistance use a cane ability to climb steps?  yes do you drive?  yes  Function disabled: date disabled 2015  I need assistance with the following:  dressing, meal prep, household duties and shopping  Neuro/Psych bladder control problems weakness numbness tremor tingling trouble walking spasms dizziness confusion depression anxiety loss of taste or smell  Prior Studies Any changes since last visit?  no  Physicians involved in your care Any changes since last visit?  no        Family History  Problem Relation Age of Onset  . Cervical cancer Mother   . Diabetes Mother   . Stroke Mother   . Alzheimer's disease Mother   . Heart attack Father   . Stroke Father   . Colon cancer Father   . Anxiety disorder Maternal Aunt   . Depression Maternal Aunt   . Anxiety disorder Maternal Uncle   . Depression Maternal Uncle        suicide attempt by gun shot wound to head. Survived  . Alcohol abuse Other   . Drug abuse Other   . Breast cancer Maternal Grandmother   . Diabetes Maternal Grandmother   . Breast cancer Paternal Aunt   . Colon polyps Sister        x 2   Social History        Socioeconomic History  . Marital status: Married    Spouse name: Not on file  . Number of children: 0  . Years of education: Not on file  . Highest education level: Not on file  Occupational History  . Occupation: disabled  Social Needs  . Financial resource strain: Not on file  . Food insecurity:    Worry: Not on file    Inability:  Not on file  . Transportation needs:    Medical: Not on file    Non-medical: Not on file  Tobacco Use  . Smoking status: Never Smoker  . Smokeless tobacco: Never Used  Substance and Sexual Activity  . Alcohol use: No    Alcohol/week: 0.0 standard drinks  . Drug use: No  . Sexual activity: Yes  Lifestyle  . Physical activity:    Days per week: Not on file    Minutes per session: Not on file  . Stress: Not on file  Relationships  . Social connections:    Talks on phone: Not on file    Gets together: Not on  file    Attends religious service: Not on file    Active member of club or organization: Not on file    Attends meetings of clubs or organizations: Not on file    Relationship status: Not on file  Other Topics Concern  . Not on file  Social History Narrative   Pt has h.s. degree        Past Surgical History:  Procedure Laterality Date  . ABDOMINAL HYSTERECTOMY  1999  . bunions removed  1988  . C4 C5 cage insertion  06/28/2013  . CHOLECYSTECTOMY  04/25/2008  . EDG  12/17/2004  . ELECTROCARDIOGRAM  05/27/2007  . L5 S1 rod insertion  11/07/2012  . SKIN CANCER EXCISION    . SKIN CANCER EXCISION     several  . TONSILLECTOMY  1981  . TUBAL LIGATION  1997       Past Medical History:  Diagnosis Date  . Anemia   . ANXIETY 06/10/2007  . Chronic headaches   . Degenerative arthritis of spine    back and neck  . Depression   . DYSLIPIDEMIA 06/28/2008  . Esophageal stricture   . Fibromyalgia   . Gallstones   . GERD 06/10/2007  . Hiatal hernia   . 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 HYPERTENSION 06/10/2007  . Irritable bowel syndrome 06/28/2008  . OSTEOARTHRITIS 06/10/2007  . Skin cancer    basal cell  . Tubular adenoma of colon    BP (!) 136/101   Pulse (!) 105   Temp 98.1 F (36.7 C)   Ht 5\' 1"  (1.549 m)   Wt 163 lb (73.9 kg)   SpO2 97%   BMI 30.80 kg/m   Opioid Risk Score:   Fall Risk Score:  `1  Depression screen PHQ 2/9  Depression screen [facility] 2/9 12/06/2018 09/22/2018 09/20/2018 03/28/2018 01/25/2018 11/09/2017 10/08/2017  Decreased Interest 1 1 1 3 3 1  -  Down, Depressed, Hopeless 1 1 1 3 2 1  -  PHQ - 2 Score 2 2 2 6 5 2  -  Altered sleeping - - 1 - 0 - 1  Tired, decreased energy - - 1 - 3 - 1  Change in appetite - - 1 - 1 - -  Feeling bad or failure about yourself  - - 1 - 1 - -  Trouble concentrating - - 1 - 2 - -  Moving slowly or fidgety/restless - - 1 - 0 - -  Suicidal thoughts - - 0 - 0 - -  PHQ-9 Score - - 8 - 12 - -  Difficult doing  work/chores - - Extremely dIfficult - - - -  Some recent data might be hidden     Review of Systems  Constitutional: Positive for appetite change and unexpected weight change.  HENT: Negative.   Eyes: Negative.  Respiratory: Positive for cough.   Cardiovascular: Negative.   Gastrointestinal: Positive for abdominal pain and diarrhea.  Endocrine: Negative.   Genitourinary: Positive for difficulty urinating.  Musculoskeletal: Positive for arthralgias, back pain, gait problem, joint swelling and myalgias.  Skin: Negative.   Allergic/Immunologic: Negative.   Neurological: Positive for dizziness, tremors, weakness and numbness.  Hematological: Bruises/bleeds easily.  Psychiatric/Behavioral: Positive for confusion, decreased concentration and dysphoric mood. The patient is nervous/anxious.   All other systems reviewed and are negative.     Objective:   Objective   Physical Exam Vitals signs and nursing note reviewed.  Constitutional:      Appearance: Normal appearance.  Neck:     Musculoskeletal: Normal range of motion and neck supple.  Cardiovascular:     Rate and Rhythm: Normal rate and regular rhythm.     Pulses: Normal pulses.     Heart sounds: Normal heart sounds.  Pulmonary:     Effort: Pulmonary effort is normal.     Breath sounds: Normal breath sounds.  Musculoskeletal:     Comments: Normal Muscle Bulk and Muscle Testing Reveals:  Upper Extremities: Right: Decreased ROM 45 Degrees and Muscle Strength 4/5  Left: Full ROM and Muscle Strength 5/5   Lumbar Paraspinal Tenderness: L-3-L-5 Lower Extremities: Full ROM and Muscle Strength 5/5  Arises from Table with ease Narrow Based  Gait   Skin:    General: Skin is warm and dry.  Neurological:     Mental Status: She is alert and oriented to person, place, and time.  Psychiatric:        Mood and Affect: Mood normal.        Behavior: Behavior normal.        Assessment & Plan:  1. History of chronic lumbar facet  disease with spondylosis/DDD/ L4-5 spondylolisthesis.With L4-5 L5-S1 decompression stabilization:01/03/2019 Continue: Hydrocodone 10/325mg  one tablet every 6 hours as needed #120.  We will continue the opioid monitoring program, this consists of regular clinic visits, examinations, urine drug screen, pill counts as well as use of [place] Controlled Substance Reporting System. 2. Depression with anxiety/ PTSD: Dr. Sima Matas Following. 01/03/2019.Continue Xanax, and Effexor. 3. Cervical spondylosis: Stable at this time with no complaints. Continue Medication Regime.01/03/2019 4. 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 Continue Ritalin 10 mg one tablet twice a day 0700 and noon #60. Dr. Naaman Plummer note was reviewed, we discuss the slow titration in the fall, she verbalizes understanding. 01/03/2019 5. Muscle Spasm: Continuecurrent medication regimen withFlexeril: May take 1-2 tablets at HS. 01/03/2019 6. Left Lumbar Radiculitis: Continue Gabapentin. Continue to Monitor.01/03/2019 7. Left Knee Osteoarthritis: S/PLeft Knee Injection with good relief noted.01/03/2019  F/U in 1 month

## 2019-01-06 LAB — DRUG TOX MONITOR 1 W/CONF, ORAL FLD
Alprazolam: 1.88 ng/mL — ABNORMAL HIGH (ref <0.50)
Amphetamines: NEGATIVE ng/mL (ref <10)
Barbiturates: NEGATIVE ng/mL (ref <10)
Benzodiazepines: POSITIVE ng/mL — AB (ref <0.50)
Buprenorphine: NEGATIVE ng/mL (ref <0.10)
CLONAZEPAM: NEGATIVE ng/mL (ref <0.50)
Chlordiazepoxide: NEGATIVE ng/mL (ref <0.50)
Cocaine: NEGATIVE ng/mL (ref <5.0)
Codeine: NEGATIVE ng/mL (ref <2.5)
DIHYDROCODEINE: 5.6 ng/mL — AB (ref <2.5)
Diazepam: NEGATIVE ng/mL (ref <0.50)
FENTANYL: NEGATIVE ng/mL (ref <0.10)
Flunitrazepam: NEGATIVE ng/mL (ref <0.50)
Flurazepam: NEGATIVE ng/mL (ref <0.50)
Heroin Metabolite: NEGATIVE ng/mL (ref <1.0)
Hydrocodone: 97.2 ng/mL — ABNORMAL HIGH (ref <2.5)
Hydromorphone: NEGATIVE ng/mL (ref <2.5)
Lorazepam: NEGATIVE ng/mL (ref <0.50)
MARIJUANA: NEGATIVE ng/mL (ref <2.5)
MDMA: NEGATIVE ng/mL (ref <10)
Meprobamate: NEGATIVE ng/mL (ref <2.5)
Methadone: NEGATIVE ng/mL (ref <5.0)
Midazolam: NEGATIVE ng/mL (ref <0.50)
Morphine: NEGATIVE ng/mL (ref <2.5)
Nicotine Metabolite: NEGATIVE ng/mL (ref <5.0)
Nordiazepam: NEGATIVE ng/mL (ref <0.50)
Norhydrocodone: 7.4 ng/mL — ABNORMAL HIGH (ref <2.5)
Noroxycodone: NEGATIVE ng/mL (ref <2.5)
Opiates: POSITIVE ng/mL — AB (ref <2.5)
Oxazepam: NEGATIVE ng/mL (ref <0.50)
Oxycodone: NEGATIVE ng/mL (ref <2.5)
Oxymorphone: NEGATIVE ng/mL (ref <2.5)
Phencyclidine: NEGATIVE ng/mL (ref <10)
TEMAZEPAM: NEGATIVE ng/mL (ref <0.50)
Tapentadol: NEGATIVE ng/mL (ref <5.0)
Tramadol: NEGATIVE ng/mL (ref <5.0)
Triazolam: NEGATIVE ng/mL (ref <0.50)
Zolpidem: NEGATIVE ng/mL (ref <5.0)

## 2019-01-06 LAB — DRUG TOX METHYLPHEN W/CONF,ORAL FLD
Methylphenidate: 21 ng/mL — ABNORMAL HIGH (ref <1.0)
Methylphenidate: POSITIVE ng/mL — AB (ref <1.0)

## 2019-01-06 LAB — DRUG TOX ALC METAB W/CON, ORAL FLD: Alcohol Metabolite: NEGATIVE ng/mL (ref <25)

## 2019-01-10 ENCOUNTER — Telehealth: Payer: Self-pay | Admitting: *Deleted

## 2019-01-10 NOTE — Telephone Encounter (Signed)
Oral swab drug screen was consistent for prescribed medications.  ?

## 2019-01-18 ENCOUNTER — Telehealth: Payer: Self-pay | Admitting: Registered Nurse

## 2019-01-18 NOTE — Telephone Encounter (Signed)
Placed a call to [name] regarding her My-Chart message, she didn't answer. Left message for [name], she was instructed to go to the Emergency room to be evaluated, awaiting on her return call and or My-Chart message.

## 2019-01-19 ENCOUNTER — Encounter: Payer: BLUE CROSS/BLUE SHIELD | Admitting: Psychology

## 2019-01-22 ENCOUNTER — Other Ambulatory Visit: Payer: Self-pay | Admitting: Endocrinology

## 2019-01-22 ENCOUNTER — Other Ambulatory Visit: Payer: Self-pay | Admitting: Registered Nurse

## 2019-01-22 NOTE — Telephone Encounter (Signed)
Please forward refill request to pt's new primary care provider.  

## 2019-01-23 ENCOUNTER — Other Ambulatory Visit: Payer: Self-pay | Admitting: Endocrinology

## 2019-01-23 NOTE — Telephone Encounter (Signed)
Please forward refill request to pt's new primary care provider.  

## 2019-02-07 ENCOUNTER — Encounter: Payer: Self-pay | Admitting: Registered Nurse

## 2019-02-07 ENCOUNTER — Encounter: Payer: BLUE CROSS/BLUE SHIELD | Attending: Physical Medicine and Rehabilitation | Admitting: Registered Nurse

## 2019-02-07 ENCOUNTER — Other Ambulatory Visit: Payer: Self-pay

## 2019-02-07 VITALS — Ht 61.0 in | Wt 163.0 lb

## 2019-02-07 DIAGNOSIS — Z5181 Encounter for therapeutic drug level monitoring: Secondary | ICD-10-CM

## 2019-02-07 DIAGNOSIS — M47816 Spondylosis without myelopathy or radiculopathy, lumbar region: Secondary | ICD-10-CM

## 2019-02-07 DIAGNOSIS — F411 Generalized anxiety disorder: Secondary | ICD-10-CM | POA: Diagnosis not present

## 2019-02-07 DIAGNOSIS — M961 Postlaminectomy syndrome, not elsewhere classified: Secondary | ICD-10-CM | POA: Diagnosis not present

## 2019-02-07 DIAGNOSIS — F0781 Postconcussional syndrome: Secondary | ICD-10-CM

## 2019-02-07 DIAGNOSIS — G894 Chronic pain syndrome: Secondary | ICD-10-CM

## 2019-02-07 DIAGNOSIS — M5416 Radiculopathy, lumbar region: Secondary | ICD-10-CM

## 2019-02-07 DIAGNOSIS — M461 Sacroiliitis, not elsewhere classified: Secondary | ICD-10-CM | POA: Insufficient documentation

## 2019-02-07 DIAGNOSIS — F3289 Other specified depressive episodes: Secondary | ICD-10-CM

## 2019-02-07 DIAGNOSIS — M47817 Spondylosis without myelopathy or radiculopathy, lumbosacral region: Secondary | ICD-10-CM

## 2019-02-07 DIAGNOSIS — M62838 Other muscle spasm: Secondary | ICD-10-CM

## 2019-02-07 DIAGNOSIS — Z79899 Other long term (current) drug therapy: Secondary | ICD-10-CM

## 2019-02-07 MED ORDER — HYDROCODONE-ACETAMINOPHEN 10-325 MG PO TABS: 1.0000 | ORAL_TABLET | Freq: Four times a day (QID) | ORAL | 0 refills | Status: DC | PRN

## 2019-02-07 MED ORDER — ALPRAZOLAM 1 MG PO TABS: ORAL_TABLET | ORAL | 2 refills | Status: DC

## 2019-02-07 MED ORDER — METHYLPHENIDATE HCL 10 MG PO TABS: 10.0000 mg | ORAL_TABLET | Freq: Two times a day (BID) | ORAL | 0 refills | Status: DC

## 2019-02-07 NOTE — Progress Notes (Signed)
Subjective:    Patient ID: [name], female    DOB: [birth year], 63 y.o.   MRN: [id number]  HPI: [name] is a 63 y.o. female her appointment was changed, due to national recommendations of social distancing due to [place] 19, an audio/video telehealth visit is felt to be most appropriate for this patient at this time.  See Chart message from today for the patient's consent to telehealth from [place].   She states her pain is located in her lower back radiating into her left lower extremity. She rates her pain 8. Her current exercise regime is walking.  [name] reports her mother was placed in hospice, she and her sister's are providing 24 hour care. Emotional support given. We discuss her previous call regarding her depression and she has no suicidal ideation, this provider  spoke with Dr. Sima Matas  he's willing to see [name], she verbalizes understanding. She is scheduled for Webex with Dr. Brooke Dare, she verbalizes understanding.   Also reports she successfully weaned off her Latuda due to bouts of anger. Also states she has placed a call to her insurance company trying to obtain insurance  approval for psychiatrist, at this time she hasn't been successful. She will keep this provider updated.   [name] Morphine equivalent is 40.00 MME.  Her last Oral Swab was performed on 01/03/2019, it was consistent.   Jasmine December CMA asked the Health and History Questions. This provider and Kennon Rounds verified we were speaking with the correct person using two identifiers.   Pain Inventory Average Pain 8 Pain Right Now 8 My pain is intermittent, constant, sharp, burning, dull, stabbing, tingling and aching  In the last 24 hours, has pain interfered with the following? General activity 8 Relation with others 8 Enjoyment of life 8 What TIME of day is your pain at its worst? morning Sleep (in general) Fair  Pain is worse with:  walking, bending, standing and some activites Pain improves with: rest and medication Relief from Meds: 6  Mobility use a cane ability to climb steps?  yes do you drive?  yes  Function disabled: date disabled na I need assistance with the following:  meal prep and shopping  Neuro/Psych bladder control problems weakness numbness tremor tingling trouble walking spasms dizziness confusion depression anxiety loss of taste or smell  Prior Studies Any changes since last visit?  no  Physicians involved in your care Any changes since last visit?  no   Family History  Problem Relation Age of Onset  . Cervical cancer Mother   . Diabetes Mother   . Stroke Mother   . Alzheimer's disease Mother   . Heart attack Father   . Stroke Father   . Colon cancer Father   . Anxiety disorder Maternal Aunt   . Depression Maternal Aunt   . Anxiety disorder Maternal Uncle   . Depression Maternal Uncle        suicide attempt by gun shot wound to head. Survived  . Alcohol abuse Other   . Drug abuse Other   . Breast cancer Maternal Grandmother   . Diabetes Maternal Grandmother   . Breast cancer Paternal Aunt   . Colon polyps Sister        x 2   Social History   Socioeconomic History  . Marital status: Married    Spouse name: Not on file  . Number of children: 0  . Years of education: Not on  file  . Highest education level: Not on file  Occupational History  . Occupation: disabled  Social Needs  . Financial resource strain: Not on file  . Food insecurity:    Worry: Not on file    Inability: Not on file  . Transportation needs:    Medical: Not on file    Non-medical: Not on file  Tobacco Use  . Smoking status: Never Smoker  . Smokeless tobacco: Never Used  Substance and Sexual Activity  . Alcohol use: No    Alcohol/week: 0.0 standard drinks  . Drug use: No  . Sexual activity: Yes  Lifestyle  . Physical activity:    Days per week: Not on file    Minutes per session:  Not on file  . Stress: Not on file  Relationships  . Social connections:    Talks on phone: Not on file    Gets together: Not on file    Attends religious service: Not on file    Active member of club or organization: Not on file    Attends meetings of clubs or organizations: Not on file    Relationship status: Not on file  Other Topics Concern  . Not on file  Social History Narrative   Pt has h.s. degree   Past Surgical History:  Procedure Laterality Date  . ABDOMINAL HYSTERECTOMY  1999  . bunions removed  1988  . C4 C5 cage insertion  06/28/2013  . CHOLECYSTECTOMY  04/25/2008  . EDG  12/17/2004  . ELECTROCARDIOGRAM  05/27/2007  . L5 S1 rod insertion  11/07/2012  . SKIN CANCER EXCISION    . SKIN CANCER EXCISION     several  . TONSILLECTOMY  1981  . TUBAL LIGATION  1997   Past Medical History:  Diagnosis Date  . Anemia   . ANXIETY 06/10/2007  . Chronic headaches   . Degenerative arthritis of spine    back and neck  . Depression   . DYSLIPIDEMIA 06/28/2008  . Esophageal stricture   . Fibromyalgia   . Gallstones   . GERD 06/10/2007  . Hiatal hernia   . 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 HYPERTENSION 06/10/2007  . Irritable bowel syndrome 06/28/2008  . OSTEOARTHRITIS 06/10/2007  . Skin cancer    basal cell  . Tubular adenoma of colon    Ht 5\' 1"  (1.549 m)   Wt 163 lb (73.9 kg)   BMI 30.80 kg/m   Opioid Risk Score:   Fall Risk Score:  `1  Depression screen PHQ 2/9  Depression screen [facility] 2/9 02/07/2019 12/06/2018 09/22/2018 09/20/2018 03/28/2018 01/25/2018 11/09/2017  Decreased Interest 1 1 1 1 3 3 1   Down, Depressed, Hopeless 1 1 1 1 3 2 1   PHQ - 2 Score 2 2 2 2 6 5 2   Altered sleeping - - - 1 - 0 -  Tired, decreased energy - - - 1 - 3 -  Change in appetite - - - 1 - 1 -  Feeling bad or failure about yourself  - - - 1 - 1 -  Trouble concentrating - - - 1 - 2 -  Moving slowly or fidgety/restless - - - 1 - 0 -  Suicidal thoughts - - - 0 - 0 -  PHQ-9 Score - - - 8 - 12 -  Difficult  doing work/chores - - - Extremely dIfficult - - -  Some recent data might be hidden    Review of Systems  Constitutional: Positive for unexpected weight change.  HENT: Negative.   Respiratory: Positive for cough.   Gastrointestinal: Positive for abdominal pain.  Endocrine: Negative.   Genitourinary: Negative.   Musculoskeletal: Positive for arthralgias, back pain, gait problem and myalgias.  Skin: Negative.   Allergic/Immunologic: Negative.   Neurological: Positive for dizziness, tremors, weakness and numbness.  Psychiatric/Behavioral: Positive for confusion and dysphoric mood. The patient is nervous/anxious.        Objective:   Physical Exam Vitals signs and nursing note reviewed.  Musculoskeletal:     Comments: No Physical Exam: Virtual Exam   Neurological:     Mental Status: She is oriented to person, place, and time.           Assessment & Plan:  1. History of chronic lumbar facet disease with spondylosis/DDD/ L4-5 spondylolisthesis.With L4-5 L5-S1 decompression stabilization:02/07/2019 Continue: Hydrocodone 10/325mg  one tablet every 6 hours as needed #120.  We will continue the opioid monitoring program, this consists of regular clinic visits, examinations, urine drug screen, pill counts as well as use of [place] Controlled Substance Reporting System. 2. Depression with anxiety/ PTSD: Dr. Sima Matas Following. 02/07/2019.Continue Xanax, and Effexor. 3. Cervical spondylosis: Stable at this time with no complaints. Continue Medication Regime.02/07/2019 4. 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 Continue Ritalin 10 mg one tablet twice a day 0700 and noon #60. Dr. Naaman Plummer note was reviewed, we discuss the slow titration in the fall, she verbalizes understanding. 02/07/2019 5. Muscle Spasm: Continuecurrent medication regimen withFlexeril: May take 1-2 tablets at HS. 02/07/2019 6. Left Lumbar Radiculitis: Continue  Gabapentin. Continue to Monitor.02/07/2019 7. Left Knee Osteoarthritis: S/PLeft Knee Injection on 10/26/2018 with good relief noted.02/07/2019  F/U in 1 month    Webex Location of patient: In her Home Location of provider: Office Established patient Time spent on call: 15 Minutes

## 2019-02-14 ENCOUNTER — Encounter: Payer: Self-pay | Admitting: Psychology

## 2019-02-14 ENCOUNTER — Other Ambulatory Visit: Payer: Self-pay

## 2019-02-14 ENCOUNTER — Telehealth: Payer: Self-pay | Admitting: Registered Nurse

## 2019-02-14 ENCOUNTER — Encounter: Payer: BLUE CROSS/BLUE SHIELD | Attending: Physical Medicine and Rehabilitation | Admitting: Psychology

## 2019-02-14 DIAGNOSIS — M47817 Spondylosis without myelopathy or radiculopathy, lumbosacral region: Secondary | ICD-10-CM | POA: Diagnosis not present

## 2019-02-14 DIAGNOSIS — M461 Sacroiliitis, not elsewhere classified: Secondary | ICD-10-CM | POA: Insufficient documentation

## 2019-02-14 DIAGNOSIS — F411 Generalized anxiety disorder: Secondary | ICD-10-CM

## 2019-02-14 DIAGNOSIS — M961 Postlaminectomy syndrome, not elsewhere classified: Secondary | ICD-10-CM | POA: Diagnosis not present

## 2019-02-14 DIAGNOSIS — F0781 Postconcussional syndrome: Secondary | ICD-10-CM

## 2019-02-14 DIAGNOSIS — F331 Major depressive disorder, recurrent, moderate: Secondary | ICD-10-CM

## 2019-02-14 MED ORDER — ARIPIPRAZOLE 2 MG PO TABS: 2.0000 mg | ORAL_TABLET | Freq: Every day | ORAL | 2 refills | Status: DC

## 2019-02-14 NOTE — Progress Notes (Signed)
Patient:                           [name]           DOB:                               [birth year]  MR Number:                  [id number]  Location:                        [facility] [address], [place] [id number] [place] [address] Dept: [phone]  Start:  9 AM End:                               10 AM  Todays visit was 1 hour long and conducted via Webex with good audio and video connection.  It was not the first tele visit for the patient.  Identity was confirmed via two factor questions and the patient consented to perform the visit through videoconferencing.    Provider/Observer:                           Edgardo Roys PSYD  Chief Complaint:                                   Chief Complaint  Patient presents with  . Post-Traumatic Stress Disorder  . Pain  . Depression  . Memory Loss  . Anxiety    Reason For Service:                         [name] is a 63 year old female referred by Dr. Naaman Plummer due to issues of coping and dealing with a number of stressors. She has a history of postconcussion syndrome, chronic pain syndrome as well as PTSD.  The patient has been dealing with significant anxiety and depression along with a history of PTSD and postconcussion syndrome. The patient was referred for psychotherapeutic interventions primarily because of the issues of depression and coping with family stress.  The above reason for service was reviewed and remains accurate.  The patient reports that there has been significant improvements in psychosocial issues but there continues to be a lot of stress particularly around issues related to her mother's dementia and the mother not recognizing the patient.  The above reason for service was reviewed and remains applicable for the current visit.  The patient has been having more depressive symptoms with  increased psychosocial stressors including her mother at end stages of Alzheimer's Dementia.  The mother is living with patient's sister and while the mother likely needs a SNF the sister insists on taking care of the mother and allowing the mother to be at home when she dies.  However, this results in patient having to help the sister in significant ways including cleaning the sister's house and caring for mother that has significant medical and neurological needs.    The above reason for service has been reviewed and remains applicable for the current visit.  The patient  reports that many of the psychosocial stressors have been improving although her mother is at the end stage of Alzheimer's dementia.  The patient reports that she is continuing to improve her overall problem solving and coping and reducing the overall stress in her life.  Interventions Strategy:                    Cognitive/behavioral psychotherapeutic interventions  Participation Level:                           The patient was appropriate and active during current visit with good mental status.    Participation Quality:                       Appropriate and attentive                          Behavioral Observation:                  The patient was well-groomed, alert and appropriate in her interactions.  Current Psychosocial Factors:        The patient has been having more depressive symptoms with increased psychosocial stressors including her mother at end stages of Alzheimer's Dementia.  The mother is living with patient's sister and while the mother likely needs a SNF the sister insists on taking care of the mother and allowing the mother to be at home when she dies.  However, this results in patient having to help the sister in significant ways including cleaning the sister's house and caring for mother that has significant medical and neurological needs.     Content of Session:                           Reviewed current  symptoms and continued to work on issues of depression, pain, PTSD and anxiety.  Reviewed current symptoms and continue to work on therapeutic issues around pain, PTSD, and depression/anxiety.  Current Status:                                   T the patient reports that her coping skills and strategies have continued to improve and that she is doing much better as far as her interactions.  She reports that her anxiety and depressive symptoms have been improving.  Patient Progress:                               The patient continues to show improvement in her overall functioning.  Impression/Diagnosis:                     [name] is a 63 year old female referred by Dr. Naaman Plummer due to issues of coping and dealing with a number of stressors. She has a history of postconcussion syndrome, chronic pain syndrome as well as PTSD. The patient describes significant difficulty with issues related to her family and the family estate. There were issues when her father passed away where an older sister was in charge of everything and all of his possessions were been ago to this older sister. However, the middle sister began having trouble and when the patient could not help the sister rejected the patient. There've been  a number of family conflicts going on including a niece who got in trouble with doing drugs and her excuse to the patient's sister was that the patient was the one who started her doing drugs and giving her money. This is not accurate.  The patient reports that she has been experiencing increasing depressive symptoms.  The patient reports that this is closely associated with the increased levels of stress she is having to deal with related to the not too distant death of her dog as well as the significant neurological decline of her mother who has Alzheimer's disease and is being cared for at the mother's home by her sister.  The patient recently had Latuda added to her Effexor but had increase  agitation and discontinued medication.  I have talked with Danella Sensing, NP about other adjunctive therapies.  The patient has had a prescription for 2mg  Abilify each day.  Will follow-up with patient about it's benefits vs any side effects    Diagnosis:   Lumbosacral spondylosis without myelopathy  Post concussion syndrome  Lumbar post-laminectomy syndrome  GAD (generalized anxiety disorder)  Moderate episode of recurrent major depressive disorder ([place])

## 2019-02-14 NOTE — Telephone Encounter (Signed)
[name] had a Webex visit with Dr. Sima Matas this morning. Dr. Sima Matas recommendation is Abilify 2 mg daily dose. Placed a call to [name] regarding the above, she was also instructed if she develops daytime drowsiness to take the Abilify at night. She verbalizes understanding.

## 2019-03-08 ENCOUNTER — Other Ambulatory Visit: Payer: Self-pay | Admitting: Registered Nurse

## 2019-03-09 ENCOUNTER — Encounter: Payer: BLUE CROSS/BLUE SHIELD | Admitting: Registered Nurse

## 2019-03-13 ENCOUNTER — Encounter: Payer: BC Managed Care – PPO | Attending: Physical Medicine and Rehabilitation | Admitting: Registered Nurse

## 2019-03-13 ENCOUNTER — Other Ambulatory Visit: Payer: Self-pay

## 2019-03-13 VITALS — BP 134/81 | HR 95 | Temp 99.2°F | Ht 61.0 in | Wt 163.0 lb

## 2019-03-13 DIAGNOSIS — M47817 Spondylosis without myelopathy or radiculopathy, lumbosacral region: Secondary | ICD-10-CM | POA: Diagnosis not present

## 2019-03-13 DIAGNOSIS — M47816 Spondylosis without myelopathy or radiculopathy, lumbar region: Secondary | ICD-10-CM

## 2019-03-13 DIAGNOSIS — M7581 Other shoulder lesions, right shoulder: Secondary | ICD-10-CM

## 2019-03-13 DIAGNOSIS — M461 Sacroiliitis, not elsewhere classified: Secondary | ICD-10-CM | POA: Diagnosis not present

## 2019-03-13 DIAGNOSIS — M5416 Radiculopathy, lumbar region: Secondary | ICD-10-CM

## 2019-03-13 DIAGNOSIS — Z5181 Encounter for therapeutic drug level monitoring: Secondary | ICD-10-CM

## 2019-03-13 DIAGNOSIS — M961 Postlaminectomy syndrome, not elsewhere classified: Secondary | ICD-10-CM

## 2019-03-13 DIAGNOSIS — M62838 Other muscle spasm: Secondary | ICD-10-CM

## 2019-03-13 DIAGNOSIS — F0781 Postconcussional syndrome: Secondary | ICD-10-CM | POA: Diagnosis not present

## 2019-03-13 DIAGNOSIS — M778 Other enthesopathies, not elsewhere classified: Secondary | ICD-10-CM

## 2019-03-13 DIAGNOSIS — Z79899 Other long term (current) drug therapy: Secondary | ICD-10-CM

## 2019-03-13 DIAGNOSIS — F411 Generalized anxiety disorder: Secondary | ICD-10-CM

## 2019-03-13 DIAGNOSIS — G894 Chronic pain syndrome: Secondary | ICD-10-CM

## 2019-03-13 DIAGNOSIS — F331 Major depressive disorder, recurrent, moderate: Secondary | ICD-10-CM

## 2019-03-13 MED ORDER — HYDROCODONE-ACETAMINOPHEN 10-325 MG PO TABS: 1.0000 | ORAL_TABLET | Freq: Four times a day (QID) | ORAL | 0 refills | Status: DC | PRN

## 2019-03-13 MED ORDER — METHYLPHENIDATE HCL 10 MG PO TABS: 10.0000 mg | ORAL_TABLET | Freq: Two times a day (BID) | ORAL | 0 refills | Status: DC

## 2019-03-13 NOTE — Progress Notes (Signed)
Subjective:    Patient ID: [name], female    DOB: 23-Jul-1956, 63 y.o.   MRN: [id number]  HPI: [name] is a 63 y.o. female who returns for follow up appointment for chronic pain and medication refill. She states her pain is located in her right shoulder and lower back radiating into her left lower extremity. She rates her pain 9. Her current exercise regime is walking.  [name] equivalent is 40.00MME. She is also prescribed alprazolam.We have discussed the black box warning of using opioids and benzodiazepines. I highlighted the dangers of using these drugs together and discussed the adverse events including respiratory suppression, overdose, cognitive impairment and importance of compliance with current regimen. We will continue to monitor and adjust as indicated.   Pain Inventory Average Pain 9 Pain Right Now 9 My pain is sharp, burning, dull, stabbing, tingling and aching  In the last 24 hours, has pain interfered with the following? General activity 10 Relation with others 10 Enjoyment of life 10 What TIME of day is your pain at its worst? evening and night Sleep (in general) Poor  Pain is worse with: walking, bending, inactivity, standing and some activites Pain improves with: rest, heat/ice, pacing activities, medication and injections Relief from Meds: 8  Mobility walk with assistance use a cane how many minutes can you walk? a few ability to climb steps?  yes do you drive?  yes  Function disabled: date disabled 02/2014 I need assistance with the following:  dressing, meal prep, household duties and shopping  Neuro/Psych bladder control problems weakness numbness tremor tingling spasms dizziness confusion depression anxiety loss of taste or smell  Prior Studies Any changes since last visit?  no  Physicians involved in your care Any changes since last visit?  no   Family History  Problem Relation Age of Onset  . Cervical  cancer Mother   . Diabetes Mother   . Stroke Mother   . Alzheimer's disease Mother   . Heart attack Father   . Stroke Father   . Colon cancer Father   . Anxiety disorder Maternal Aunt   . Depression Maternal Aunt   . Anxiety disorder Maternal Uncle   . Depression Maternal Uncle        suicide attempt by gun shot wound to head. Survived  . Alcohol abuse Other   . Drug abuse Other   . Breast cancer Maternal Grandmother   . Diabetes Maternal Grandmother   . Breast cancer Paternal Aunt   . Colon polyps Sister        x 2   Social History   Socioeconomic History  . Marital status: Married    Spouse name: Not on file  . Number of children: 0  . Years of education: Not on file  . Highest education level: Not on file  Occupational History  . Occupation: disabled  Social Needs  . Financial resource strain: Not on file  . Food insecurity:    Worry: Not on file    Inability: Not on file  . Transportation needs:    Medical: Not on file    Non-medical: Not on file  Tobacco Use  . Smoking status: Never Smoker  . Smokeless tobacco: Never Used  Substance and Sexual Activity  . Alcohol use: No    Alcohol/week: 0.0 standard drinks  . Drug use: No  . Sexual activity: Yes  Lifestyle  . Physical activity:    Days per week: Not on file  Minutes per session: Not on file  . Stress: Not on file  Relationships  . Social connections:    Talks on phone: Not on file    Gets together: Not on file    Attends religious service: Not on file    Active member of club or organization: Not on file    Attends meetings of clubs or organizations: Not on file    Relationship status: Not on file  Other Topics Concern  . Not on file  Social History Narrative   Pt has h.s. degree   Past Surgical History:  Procedure Laterality Date  . ABDOMINAL HYSTERECTOMY  1999  . bunions removed  1988  . C4 C5 cage insertion  06/28/2013  . CHOLECYSTECTOMY  04/25/2008  . EDG  12/17/2004  . ELECTROCARDIOGRAM   05/27/2007  . L5 S1 rod insertion  11/07/2012  . SKIN CANCER EXCISION    . SKIN CANCER EXCISION     several  . TONSILLECTOMY  1981  . TUBAL LIGATION  1997   Past Medical History:  Diagnosis Date  . Anemia   . ANXIETY 06/10/2007  . Chronic headaches   . Degenerative arthritis of spine    back and neck  . Depression   . DYSLIPIDEMIA 06/28/2008  . Esophageal stricture   . Fibromyalgia   . Gallstones   . GERD 06/10/2007  . Hiatal hernia   . 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 HYPERTENSION 06/10/2007  . Irritable bowel syndrome 06/28/2008  . OSTEOARTHRITIS 06/10/2007  . Skin cancer    basal cell  . Tubular adenoma of colon    BP 134/81   Pulse 95   Temp 99.2 F (37.3 C)   Ht 5\' 1"  (1.549 m)   Wt 163 lb (73.9 kg)   SpO2 95%   BMI 30.80 kg/m   Opioid Risk Score:   Fall Risk Score:  `1  Depression screen PHQ 2/9  Depression screen [facility] 2/9 03/13/2019 02/07/2019 12/06/2018 09/22/2018 09/20/2018 03/28/2018 01/25/2018  Decreased Interest 1 1 1 1 1 3 3   Down, Depressed, Hopeless 1 1 1 1 1 3 2   PHQ - 2 Score 2 2 2 2 2 6 5   Altered sleeping - - - - 1 - 0  Tired, decreased energy - - - - 1 - 3  Change in appetite - - - - 1 - 1  Feeling bad or failure about yourself  - - - - 1 - 1  Trouble concentrating - - - - 1 - 2  Moving slowly or fidgety/restless - - - - 1 - 0  Suicidal thoughts - - - - 0 - 0  PHQ-9 Score - - - - 8 - 12  Difficult doing work/chores - - - - Extremely dIfficult - -  Some recent data might be hidden    Review of Systems  Constitutional: Negative.   HENT: Negative.   Eyes: Negative.   Respiratory: Negative.   Cardiovascular: Negative.   Gastrointestinal: Negative.   Endocrine: Negative.   Genitourinary: Negative.   Musculoskeletal: Positive for back pain and neck pain.       Spasms  Skin: Negative.   Allergic/Immunologic: Negative.   Neurological: Positive for dizziness, tremors, weakness and numbness.       Tingling  Hematological: Negative.   Psychiatric/Behavioral:  Positive for dysphoric mood. The patient is nervous/anxious.   All other systems reviewed and are negative.      Objective:   Physical Exam Vitals signs and nursing note reviewed.  Constitutional:      Appearance: Normal appearance.  Neck:     Musculoskeletal: Normal range of motion and neck supple.  Cardiovascular:     Rate and Rhythm: Normal rate and regular rhythm.     Pulses: Normal pulses.     Heart sounds: Normal heart sounds.  Pulmonary:     Effort: Pulmonary effort is normal.     Breath sounds: Normal breath sounds.  Musculoskeletal:     Comments: Normal Muscle Bulk and Muscle Testing Reveals:  Upper Extremities: Full ROM and Muscle Strength 5/5 Right AC Joint Tenderness Lumbar Paraspinal Tenderness: L-4-L-5 Lower Extremities: Full ROM and Muscle Strength 5/5 Arises from Table with ease Narrow Based Gait   Skin:    General: Skin is warm and dry.  Neurological:     Mental Status: She is alert and oriented to person, place, and time.  Psychiatric:        Mood and Affect: Mood normal.        Behavior: Behavior normal.           Assessment & Plan:  1. History of chronic lumbar facet disease with spondylosis/DDD/ L4-5 spondylolisthesis.With L4-5 L5-S1 decompression stabilization:03/13/2019 Continue: Hydrocodone 10/325mg  one tablet every 6 hours as needed #120.  We will continue the opioid monitoring program, this consists of regular clinic visits, examinations, urine drug screen, pill counts as well as use of [place] Controlled Substance Reporting System. 2. Depression with anxiety/ PTSD: Dr. Sima Matas Following. 03/13/2019.Continue Xanax, and Effexor. 3. Cervical spondylosis: Stable at this time with no complaints. Continue Medication Regime.03/13/2019 4. 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 Continue Ritalin 10 mg one tablet twice a day 0700 and noon #60. Dr. Naaman Plummer note was reviewed, we discuss the slow  titration in the fall, she verbalizes understanding. 03/13/2019 5. Muscle Spasm: Continuecurrent medication regimen withFlexeril: May take 1-2 tablets at HS. 03/13/2019 6. Left Lumbar Radiculitis: Continue Gabapentin. Continue to Monitor.03/13/2019 7. Left Knee Osteoarthritis: No complaints today. S/PLeft Knee Injection on 10/26/2018 with good relief noted.03/13/2019  F/U in 1 month

## 2019-03-14 ENCOUNTER — Encounter: Payer: Self-pay | Admitting: Registered Nurse

## 2019-03-22 ENCOUNTER — Other Ambulatory Visit: Payer: Self-pay | Admitting: Physical Medicine & Rehabilitation

## 2019-03-22 ENCOUNTER — Encounter: Payer: Self-pay | Admitting: Family Medicine

## 2019-03-22 ENCOUNTER — Other Ambulatory Visit: Payer: Self-pay | Admitting: Endocrinology

## 2019-03-23 MED ORDER — ACYCLOVIR 800 MG PO TABS: 800.0000 mg | ORAL_TABLET | Freq: Every day | ORAL | 3 refills | Status: DC

## 2019-04-01 ENCOUNTER — Other Ambulatory Visit: Payer: Self-pay | Admitting: Registered Nurse

## 2019-04-07 ENCOUNTER — Encounter: Payer: Self-pay | Admitting: Registered Nurse

## 2019-04-07 ENCOUNTER — Other Ambulatory Visit: Payer: Self-pay

## 2019-04-07 ENCOUNTER — Encounter: Payer: BC Managed Care – PPO | Attending: Registered Nurse | Admitting: Registered Nurse

## 2019-04-07 VITALS — BP 123/79 | HR 91 | Temp 97.9°F | Ht 61.0 in | Wt 157.0 lb

## 2019-04-07 DIAGNOSIS — F411 Generalized anxiety disorder: Secondary | ICD-10-CM

## 2019-04-07 DIAGNOSIS — F0781 Postconcussional syndrome: Secondary | ICD-10-CM

## 2019-04-07 DIAGNOSIS — M62838 Other muscle spasm: Secondary | ICD-10-CM | POA: Diagnosis not present

## 2019-04-07 DIAGNOSIS — G894 Chronic pain syndrome: Secondary | ICD-10-CM | POA: Diagnosis not present

## 2019-04-07 DIAGNOSIS — Z79899 Other long term (current) drug therapy: Secondary | ICD-10-CM

## 2019-04-07 DIAGNOSIS — M5416 Radiculopathy, lumbar region: Secondary | ICD-10-CM | POA: Diagnosis not present

## 2019-04-07 DIAGNOSIS — Z5181 Encounter for therapeutic drug level monitoring: Secondary | ICD-10-CM | POA: Diagnosis not present

## 2019-04-07 DIAGNOSIS — M47817 Spondylosis without myelopathy or radiculopathy, lumbosacral region: Secondary | ICD-10-CM

## 2019-04-07 DIAGNOSIS — F331 Major depressive disorder, recurrent, moderate: Secondary | ICD-10-CM | POA: Diagnosis not present

## 2019-04-07 DIAGNOSIS — M47816 Spondylosis without myelopathy or radiculopathy, lumbar region: Secondary | ICD-10-CM

## 2019-04-07 DIAGNOSIS — M961 Postlaminectomy syndrome, not elsewhere classified: Secondary | ICD-10-CM | POA: Diagnosis not present

## 2019-04-07 MED ORDER — HYDROCODONE-ACETAMINOPHEN 10-325 MG PO TABS: 1.0000 | ORAL_TABLET | Freq: Four times a day (QID) | ORAL | 0 refills | Status: DC | PRN

## 2019-04-07 MED ORDER — METHYLPHENIDATE HCL 10 MG PO TABS: 10.0000 mg | ORAL_TABLET | Freq: Two times a day (BID) | ORAL | 0 refills | Status: DC

## 2019-04-07 NOTE — Progress Notes (Signed)
Subjective:    Patient ID: [name], female    DOB: [birth year], 63 y.o.   MRN: [id number]  HPI: [name] is a 63 y.o. female who returns for follow up appointment for chronic pain and medication refill. She states her pain is located in her lower back radiating into her left buttock and left lower extremity. She rates her pain 8. Her current exercise regime is walking and performing stretching exercises.  Ms. Meneely Morphine equivalent is 40.00 MME. She is also prescribed Alprazolam. We have discussed the black box warning of using opioids and benzodiazepines. I highlighted the dangers of using these drugs together and discussed the adverse events including respiratory suppression, overdose, cognitive impairment and importance of compliance with current regimen. We will continue to monitor and adjust as indicated.   She  is being closely monitored and under the care of Dr. Sima Matas Psychologist.    Pain Inventory Average Pain 8 Pain Right Now 8 My pain is sharp, burning, stabbing and aching  In the last 24 hours, has pain interfered with the following? General activity 10 Relation with others 10 Enjoyment of life 10 What TIME of day is your pain at its worst? . Sleep (in general) Fair  Pain is worse with: walking, sitting, inactivity, standing and some activites Pain improves with: rest, heat/ice, pacing activities, medication and injections Relief from Meds: 8  Mobility use a cane ability to climb steps?  yes do you drive?  yes  Function disabled: date disabled . I need assistance with the following:  dressing, meal prep, household duties and shopping  Neuro/Psych bladder control problems weakness numbness tremor tingling trouble walking spasms dizziness confusion depression anxiety loss of taste or smell  Prior Studies Any changes since last visit?  no  Physicians involved in your care Any changes since last visit?  no   Family History   Problem Relation Age of Onset  . Cervical cancer Mother   . Diabetes Mother   . Stroke Mother   . Alzheimer's disease Mother   . Heart attack Father   . Stroke Father   . Colon cancer Father   . Anxiety disorder Maternal Aunt   . Depression Maternal Aunt   . Anxiety disorder Maternal Uncle   . Depression Maternal Uncle        suicide attempt by gun shot wound to head. Survived  . Alcohol abuse Other   . Drug abuse Other   . Breast cancer Maternal Grandmother   . Diabetes Maternal Grandmother   . Breast cancer Paternal Aunt   . Colon polyps Sister        x 2   Social History   Socioeconomic History  . Marital status: Married    Spouse name: Not on file  . Number of children: 0  . Years of education: Not on file  . Highest education level: Not on file  Occupational History  . Occupation: disabled  Social Needs  . Financial resource strain: Not on file  . Food insecurity    Worry: Not on file    Inability: Not on file  . Transportation needs    Medical: Not on file    Non-medical: Not on file  Tobacco Use  . Smoking status: Never Smoker  . Smokeless tobacco: Never Used  Substance and Sexual Activity  . Alcohol use: No    Alcohol/week: 0.0 standard drinks  . Drug use: No  . Sexual activity: Yes  Lifestyle  . Physical  activity    Days per week: Not on file    Minutes per session: Not on file  . Stress: Not on file  Relationships  . Social Herbalist on phone: Not on file    Gets together: Not on file    Attends religious service: Not on file    Active member of club or organization: Not on file    Attends meetings of clubs or organizations: Not on file    Relationship status: Not on file  Other Topics Concern  . Not on file  Social History Narrative   Pt has h.s. degree   Past Surgical History:  Procedure Laterality Date  . ABDOMINAL HYSTERECTOMY  1999  . bunions removed  1988  . C4 C5 cage insertion  06/28/2013  . CHOLECYSTECTOMY  04/25/2008   . EDG  12/17/2004  . ELECTROCARDIOGRAM  05/27/2007  . L5 S1 rod insertion  11/07/2012  . SKIN CANCER EXCISION    . SKIN CANCER EXCISION     several  . TONSILLECTOMY  1981  . TUBAL LIGATION  1997   Past Medical History:  Diagnosis Date  . Anemia   . ANXIETY 06/10/2007  . Chronic headaches   . Degenerative arthritis of spine    back and neck  . Depression   . DYSLIPIDEMIA 06/28/2008  . Esophageal stricture   . Fibromyalgia   . Gallstones   . GERD 06/10/2007  . Hiatal hernia   . 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 HYPERTENSION 06/10/2007  . Irritable bowel syndrome 06/28/2008  . OSTEOARTHRITIS 06/10/2007  . Skin cancer    basal cell  . Tubular adenoma of colon    BP 123/79   Pulse 91   Temp 97.9 F (36.6 C)   Ht 5\' 1"  (1.549 m)   Wt 157 lb (71.2 kg)   SpO2 98%   BMI 29.66 kg/m   Opioid Risk Score:   Fall Risk Score:  `1  Depression screen PHQ 2/9  Depression screen [facility] 2/9 03/13/2019 02/07/2019 12/06/2018 09/22/2018 09/20/2018 03/28/2018 01/25/2018  Decreased Interest 1 1 1 1 1 3 3   Down, Depressed, Hopeless 1 1 1 1 1 3 2   PHQ - 2 Score 2 2 2 2 2 6 5   Altered sleeping - - - - 1 - 0  Tired, decreased energy - - - - 1 - 3  Change in appetite - - - - 1 - 1  Feeling bad or failure about yourself  - - - - 1 - 1  Trouble concentrating - - - - 1 - 2  Moving slowly or fidgety/restless - - - - 1 - 0  Suicidal thoughts - - - - 0 - 0  PHQ-9 Score - - - - 8 - 12  Difficult doing work/chores - - - - Extremely dIfficult - -  Some recent data might be hidden     Review of Systems  Constitutional: Negative.   HENT: Negative.   Eyes: Negative.   Respiratory: Negative.   Cardiovascular: Negative.   Gastrointestinal: Negative.   Endocrine: Negative.   Genitourinary: Positive for difficulty urinating.  Musculoskeletal: Positive for arthralgias and gait problem.  Skin: Negative.   Allergic/Immunologic: Negative.   Neurological: Positive for dizziness, tremors, weakness and numbness.   Hematological: Negative.   Psychiatric/Behavioral: Positive for confusion and dysphoric mood. The patient is nervous/anxious.        Objective:   Physical Exam Vitals signs and nursing note reviewed.  Constitutional:  Appearance: Normal appearance.  Neck:     Musculoskeletal: Normal range of motion and neck supple.  Cardiovascular:     Rate and Rhythm: Normal rate and regular rhythm.     Pulses: Normal pulses.     Heart sounds: Normal heart sounds.  Pulmonary:     Effort: Pulmonary effort is normal.     Breath sounds: Normal breath sounds.  Musculoskeletal:     Comments: Normal Muscle Bulk and Muscle Testing Reveals:  Upper Extremities: Full ROM and Muscle Strength 5/5  Lumbar Paraspinal Tenderness: L-4-L-5 Lower Extremities: Full ROM and Muscle Strength 5/5 Arises from chair with ease Narrow Based  Gait   Skin:    General: Skin is warm and dry.  Neurological:     Mental Status: She is alert and oriented to person, place, and time.  Psychiatric:        Mood and Affect: Mood normal.        Behavior: Behavior normal.           Assessment & Plan:  1. History of chronic lumbar facet disease with spondylosis/DDD/ L4-5 spondylolisthesis.With L4-5 L5-S1 decompression stabilization:04/07/2019 Continue: Hydrocodone 10/325mg  one tablet every 6 hours as needed #120.  We will continue the opioid monitoring program, this consists of regular clinic visits, examinations, urine drug screen, pill counts as well as use of [place] Controlled Substance Reporting System. 2. Depression with anxiety/ PTSD: Dr. Sima Matas Following. 04/07/2019.Continue Xanax, and Effexor. 3. Cervical spondylosis: Stable at this time with no complaints. Continue Medication Regime.04/07/2019 4. 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 Continue Ritalin 10 mg one tablet twice a day 0700 and noon #60. Dr. Naaman Plummer note was reviewed, we discuss the slow  titration in the fall, she verbalizes understanding. 04/07/2019 5. Muscle Spasm: Continuecurrent medication regimen withFlexeril: May take 1-2 tablets at HS. 04/07/2019 6. Left Lumbar Radiculitis: Continue Gabapentin. Continue to Monitor.04/07/2019 7. Left Knee Osteoarthritis: No complaints today. S/PLeft Knee Injectionon 01/15/2020with good relief noted.04/07/2019  F/U in 1 month   70minutes of face to face patient care time was spent during this visit. All questions were encouraged and answered.

## 2019-04-13 ENCOUNTER — Encounter: Payer: BLUE CROSS/BLUE SHIELD | Admitting: Psychology

## 2019-05-07 ENCOUNTER — Other Ambulatory Visit: Payer: Self-pay | Admitting: Registered Nurse

## 2019-05-09 ENCOUNTER — Encounter: Payer: Self-pay | Admitting: Registered Nurse

## 2019-05-09 ENCOUNTER — Other Ambulatory Visit: Payer: Self-pay

## 2019-05-09 ENCOUNTER — Encounter: Payer: BC Managed Care – PPO | Attending: Physical Medicine and Rehabilitation | Admitting: Registered Nurse

## 2019-05-09 VITALS — BP 129/79 | HR 99 | Temp 98.7°F | Resp 12 | Ht 64.0 in | Wt 157.0 lb

## 2019-05-09 DIAGNOSIS — M961 Postlaminectomy syndrome, not elsewhere classified: Secondary | ICD-10-CM | POA: Diagnosis not present

## 2019-05-09 DIAGNOSIS — M47816 Spondylosis without myelopathy or radiculopathy, lumbar region: Secondary | ICD-10-CM | POA: Diagnosis not present

## 2019-05-09 DIAGNOSIS — Z79899 Other long term (current) drug therapy: Secondary | ICD-10-CM

## 2019-05-09 DIAGNOSIS — G894 Chronic pain syndrome: Secondary | ICD-10-CM

## 2019-05-09 DIAGNOSIS — F0781 Postconcussional syndrome: Secondary | ICD-10-CM | POA: Diagnosis not present

## 2019-05-09 DIAGNOSIS — M62838 Other muscle spasm: Secondary | ICD-10-CM

## 2019-05-09 DIAGNOSIS — F331 Major depressive disorder, recurrent, moderate: Secondary | ICD-10-CM

## 2019-05-09 DIAGNOSIS — Z5181 Encounter for therapeutic drug level monitoring: Secondary | ICD-10-CM

## 2019-05-09 DIAGNOSIS — M47817 Spondylosis without myelopathy or radiculopathy, lumbosacral region: Secondary | ICD-10-CM | POA: Insufficient documentation

## 2019-05-09 DIAGNOSIS — M461 Sacroiliitis, not elsewhere classified: Secondary | ICD-10-CM | POA: Diagnosis not present

## 2019-05-09 DIAGNOSIS — F411 Generalized anxiety disorder: Secondary | ICD-10-CM | POA: Insufficient documentation

## 2019-05-09 MED ORDER — HYDROCODONE-ACETAMINOPHEN 10-325 MG PO TABS: 1.0000 | ORAL_TABLET | Freq: Four times a day (QID) | ORAL | 0 refills | Status: DC | PRN

## 2019-05-09 MED ORDER — ALPRAZOLAM 1 MG PO TABS: ORAL_TABLET | ORAL | 2 refills | Status: DC

## 2019-05-09 MED ORDER — METHYLPHENIDATE HCL 10 MG PO TABS: 10.0000 mg | ORAL_TABLET | Freq: Two times a day (BID) | ORAL | 0 refills | Status: DC

## 2019-05-09 NOTE — Progress Notes (Signed)
Subjective:    Patient ID: [name], female    DOB: [birth year], 63 y.o.   MRN: [id number]  HPI: Kalen Neidert is a 63 y.o. female who returns for follow up appointment for chronic pain and medication refill. She states her pain is located in her lower back. She  rates her pain 8. Her current exercise regime is walking.  Ms. Kendrix Morphine equivalent is 40.00  MME.  She  is also prescribed Alprazolam. We have discussed the black box warning of using opioids and benzodiazepines. I highlighted the dangers of using these drugs together and discussed the adverse events including respiratory suppression, overdose, cognitive impairment and importance of compliance with current regimen. We will continue to monitor and adjust as indicated.   Last Oral Swab was Performed on 01/03/2019, it was consistent.   Pain Inventory Average Pain 9 Pain Right Now 8 My pain is intermittent, constant, sharp, burning, dull, stabbing, tingling and aching  In the last 24 hours, has pain interfered with the following? General activity 10 Relation with others 10 Enjoyment of life 10 What TIME of day is your pain at its worst? n/a Sleep (in general) Fair  Pain is worse with: walking, sitting, inactivity, standing and some activites Pain improves with: rest, heat/ice, pacing activities and medication Relief from Meds: 9  Mobility use a cane ability to climb steps?  yes do you drive?  yes  Function disabled: date disabled 2015  Neuro/Psych bladder control problems weakness numbness tremor tingling trouble walking spasms dizziness anxiety  Prior Studies no  Physicians involved in your care no   Family History  Problem Relation Age of Onset  . Cervical cancer Mother   . Diabetes Mother   . Stroke Mother   . Alzheimer's disease Mother   . Heart attack Father   . Stroke Father   . Colon cancer Father   . Anxiety disorder Maternal Aunt   . Depression Maternal Aunt   .  Anxiety disorder Maternal Uncle   . Depression Maternal Uncle        suicide attempt by gun shot wound to head. Survived  . Alcohol abuse Other   . Drug abuse Other   . Breast cancer Maternal Grandmother   . Diabetes Maternal Grandmother   . Breast cancer Paternal Aunt   . Colon polyps Sister        x 2   Social History   Socioeconomic History  . Marital status: Married    Spouse name: Not on file  . Number of children: 0  . Years of education: Not on file  . Highest education level: Not on file  Occupational History  . Occupation: disabled  Social Needs  . Financial resource strain: Not on file  . Food insecurity    Worry: Not on file    Inability: Not on file  . Transportation needs    Medical: Not on file    Non-medical: Not on file  Tobacco Use  . Smoking status: Never Smoker  . Smokeless tobacco: Never Used  Substance and Sexual Activity  . Alcohol use: No    Alcohol/week: 0.0 standard drinks  . Drug use: No  . Sexual activity: Yes  Lifestyle  . Physical activity    Days per week: Not on file    Minutes per session: Not on file  . Stress: Not on file  Relationships  . Social Herbalist on phone: Not on file    Gets  together: Not on file    Attends religious service: Not on file    Active member of club or organization: Not on file    Attends meetings of clubs or organizations: Not on file    Relationship status: Not on file  Other Topics Concern  . Not on file  Social History Narrative   Pt has h.s. degree   Past Surgical History:  Procedure Laterality Date  . ABDOMINAL HYSTERECTOMY  1999  . bunions removed  1988  . C4 C5 cage insertion  06/28/2013  . CHOLECYSTECTOMY  04/25/2008  . EDG  12/17/2004  . ELECTROCARDIOGRAM  05/27/2007  . L5 S1 rod insertion  11/07/2012  . SKIN CANCER EXCISION    . SKIN CANCER EXCISION     several  . TONSILLECTOMY  1981  . TUBAL LIGATION  1997   Past Medical History:  Diagnosis Date  . Anemia   . ANXIETY  06/10/2007  . Chronic headaches   . Degenerative arthritis of spine    back and neck  . Depression   . DYSLIPIDEMIA 06/28/2008  . Esophageal stricture   . Fibromyalgia   . Gallstones   . GERD 06/10/2007  . Hiatal hernia   . 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 HYPERTENSION 06/10/2007  . Irritable bowel syndrome 06/28/2008  . OSTEOARTHRITIS 06/10/2007  . Skin cancer    basal cell  . Tubular adenoma of colon    There were no vitals taken for this visit.  Opioid Risk Score:   Fall Risk Score:  `1  Depression screen PHQ 2/9  Depression screen [facility] 2/9 03/13/2019 02/07/2019 12/06/2018 09/22/2018 09/20/2018 03/28/2018 01/25/2018  Decreased Interest 1 1 1 1 1 3 3   Down, Depressed, Hopeless 1 1 1 1 1 3 2   PHQ - 2 Score 2 2 2 2 2 6 5   Altered sleeping - - - - 1 - 0  Tired, decreased energy - - - - 1 - 3  Change in appetite - - - - 1 - 1  Feeling bad or failure about yourself  - - - - 1 - 1  Trouble concentrating - - - - 1 - 2  Moving slowly or fidgety/restless - - - - 1 - 0  Suicidal thoughts - - - - 0 - 0  PHQ-9 Score - - - - 8 - 12  Difficult doing work/chores - - - - Extremely dIfficult - -  Some recent data might be hidden       Review of Systems  All other systems reviewed and are negative.      Objective:   Physical Exam Vitals signs and nursing note reviewed.  Constitutional:      Appearance: Normal appearance.  Neck:     Musculoskeletal: Normal range of motion and neck supple.  Cardiovascular:     Rate and Rhythm: Normal rate and regular rhythm.     Pulses: Normal pulses.     Heart sounds: Normal heart sounds.  Pulmonary:     Effort: Pulmonary effort is normal.     Breath sounds: Normal breath sounds.  Musculoskeletal:     Comments: Normal Muscle Bulk and Muscle Testing Reveals:  Upper Extremities: Full ROM and Muscle Strength 5/5 Thoracic Paraspinal Tenderness: T-4-T-5 Lumbar Paraspinal Tenderness: L-4-L-5 Lower Extremities: Full ROM and Muscle Strength 5/5 Arises from chair with  ease Narrow Based Gait   Skin:    General: Skin is warm and dry.  Neurological:     Mental Status: She is alert and  oriented to person, place, and time.  Psychiatric:        Mood and Affect: Mood normal.        Behavior: Behavior normal.           Assessment & Plan:  1. History of chronic lumbar facet disease with spondylosis/DDD/ L4-5 spondylolisthesis.With L4-5 L5-S1 decompression stabilization:05/09/2019 Continue: Hydrocodone 10/325mg  one tablet every 6 hours as needed #120.  We will continue the opioid monitoring program, this consists of regular clinic visits, examinations, urine drug screen, pill counts as well as use of [place] Controlled Substance Reporting System. 2. Depression with anxiety/ PTSD: Dr. Sima Matas Following. 05/09/2019.Continue Xanax,Abilify and Effexor. 3. Cervical spondylosis: Stable at this time with no complaints. Continue Medication Regime.05/09/2019 4. 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 Continue Ritalin 10 mg one tablet twice a day 0700 and noon #60. Dr. Naaman Plummer note was reviewed, we discuss the slow titration in the fall, she verbalizes understanding. 05/09/2019 5. Muscle Spasm: Continuecurrent medication regimen withFlexeril: May take 1-2 tablets at HS. 05/09/2019 6. Left Lumbar Radiculitis: No complaints today. Continue to Monitor.05/09/2019 7. Left Knee Osteoarthritis:No complaints today.S/PLeft Knee Injectionon 01/15/2020with good relief noted.05/09/2019  F/U in 1 month   13minutes of face to face patient care time was spent during this visit. All questions were encouraged and answered.

## 2019-06-05 ENCOUNTER — Other Ambulatory Visit: Payer: Self-pay | Admitting: Family Medicine

## 2019-06-07 ENCOUNTER — Encounter: Payer: BC Managed Care – PPO | Attending: Physical Medicine and Rehabilitation | Admitting: Registered Nurse

## 2019-06-07 ENCOUNTER — Other Ambulatory Visit: Payer: Self-pay

## 2019-06-07 VITALS — BP 127/82 | HR 94 | Temp 98.7°F | Resp 14 | Ht 61.0 in | Wt 160.0 lb

## 2019-06-07 DIAGNOSIS — M47817 Spondylosis without myelopathy or radiculopathy, lumbosacral region: Secondary | ICD-10-CM

## 2019-06-07 DIAGNOSIS — G894 Chronic pain syndrome: Secondary | ICD-10-CM

## 2019-06-07 DIAGNOSIS — F0781 Postconcussional syndrome: Secondary | ICD-10-CM

## 2019-06-07 DIAGNOSIS — M62838 Other muscle spasm: Secondary | ICD-10-CM

## 2019-06-07 DIAGNOSIS — Z5181 Encounter for therapeutic drug level monitoring: Secondary | ICD-10-CM

## 2019-06-07 DIAGNOSIS — M47816 Spondylosis without myelopathy or radiculopathy, lumbar region: Secondary | ICD-10-CM

## 2019-06-07 DIAGNOSIS — M461 Sacroiliitis, not elsewhere classified: Secondary | ICD-10-CM | POA: Insufficient documentation

## 2019-06-07 DIAGNOSIS — F411 Generalized anxiety disorder: Secondary | ICD-10-CM

## 2019-06-07 DIAGNOSIS — Z79899 Other long term (current) drug therapy: Secondary | ICD-10-CM

## 2019-06-07 DIAGNOSIS — M961 Postlaminectomy syndrome, not elsewhere classified: Secondary | ICD-10-CM

## 2019-06-07 DIAGNOSIS — M5416 Radiculopathy, lumbar region: Secondary | ICD-10-CM

## 2019-06-07 DIAGNOSIS — F331 Major depressive disorder, recurrent, moderate: Secondary | ICD-10-CM

## 2019-06-07 MED ORDER — HYDROCODONE-ACETAMINOPHEN 10-325 MG PO TABS: 1.0000 | ORAL_TABLET | Freq: Four times a day (QID) | ORAL | 0 refills | Status: DC | PRN

## 2019-06-07 MED ORDER — METHYLPHENIDATE HCL 10 MG PO TABS: 10.0000 mg | ORAL_TABLET | Freq: Two times a day (BID) | ORAL | 0 refills | Status: DC

## 2019-06-07 NOTE — Progress Notes (Signed)
Subjective:    Patient ID: [name], female    DOB: [birth year], 63 y.o.   MRN: [id number]  HPI: [name] is a 63 y.o. female who returns for follow up appointment for chronic pain and medication refill. She states her pain is located in her lower back radiating into her left lower extremity. She rates her  Pain 8. Her current exercise regime is walking and performing stretching exercises.  [name] Morphine equivalent is  40.00 MME. She  is also prescribed Alprazolam. We have discussed the black box warning of using opioids and benzodiazepines. I highlighted the dangers of using these drugs together and discussed the adverse events including respiratory suppression, overdose, cognitive impairment and importance of compliance with current regimen. We will continue to monitor and adjust as indicated.   Last Oral Swab was Performed on 01/03/2019, it was consistent.   Pain Inventory Average Pain 9 Pain Right Now 8 My pain is sharp, burning, dull, stabbing, tingling and aching  In the last 24 hours, has pain interfered with the following? General activity 10 Relation with others 10 Enjoyment of life 10 What TIME of day is your pain at its worst? na Sleep (in general) Fair  Pain is worse with: walking, sitting, inactivity, standing, unsure and some activites Pain improves with: rest, heat/ice, pacing activities and medication Relief from Meds: 9  Mobility use a cane ability to climb steps?  yes do you drive?  yes  Function disabled: date disabled 2015 I need assistance with the following:  meal prep, household duties and shopping  Neuro/Psych bladder control problems weakness numbness tremor tingling trouble walking spasms dizziness anxiety loss of taste or smell  Prior Studies Any changes since last visit?  no  Physicians involved in your care Any changes since last visit?  no   Family History  Problem Relation Age of Onset  . Cervical cancer  Mother   . Diabetes Mother   . Stroke Mother   . Alzheimer's disease Mother   . Heart attack Father   . Stroke Father   . Colon cancer Father   . Anxiety disorder Maternal Aunt   . Depression Maternal Aunt   . Anxiety disorder Maternal Uncle   . Depression Maternal Uncle        suicide attempt by gun shot wound to head. Survived  . Alcohol abuse Other   . Drug abuse Other   . Breast cancer Maternal Grandmother   . Diabetes Maternal Grandmother   . Breast cancer Paternal Aunt   . Colon polyps Sister        x 2   Social History   Socioeconomic History  . Marital status: Married    Spouse name: Not on file  . Number of children: 0  . Years of education: Not on file  . Highest education level: Not on file  Occupational History  . Occupation: disabled  Social Needs  . Financial resource strain: Not on file  . Food insecurity    Worry: Not on file    Inability: Not on file  . Transportation needs    Medical: Not on file    Non-medical: Not on file  Tobacco Use  . Smoking status: Never Smoker  . Smokeless tobacco: Never Used  Substance and Sexual Activity  . Alcohol use: No    Alcohol/week: 0.0 standard drinks  . Drug use: No  . Sexual activity: Yes  Lifestyle  . Physical activity    Days per  week: Not on file    Minutes per session: Not on file  . Stress: Not on file  Relationships  . Social Herbalist on phone: Not on file    Gets together: Not on file    Attends religious service: Not on file    Active member of club or organization: Not on file    Attends meetings of clubs or organizations: Not on file    Relationship status: Not on file  Other Topics Concern  . Not on file  Social History Narrative   Pt has h.s. degree   Past Surgical History:  Procedure Laterality Date  . ABDOMINAL HYSTERECTOMY  1999  . bunions removed  1988  . C4 C5 cage insertion  06/28/2013  . CHOLECYSTECTOMY  04/25/2008  . EDG  12/17/2004  . ELECTROCARDIOGRAM  05/27/2007   . L5 S1 rod insertion  11/07/2012  . SKIN CANCER EXCISION    . SKIN CANCER EXCISION     several  . TONSILLECTOMY  1981  . TUBAL LIGATION  1997   Past Medical History:  Diagnosis Date  . Anemia   . ANXIETY 06/10/2007  . Chronic headaches   . Degenerative arthritis of spine    back and neck  . Depression   . DYSLIPIDEMIA 06/28/2008  . Esophageal stricture   . Fibromyalgia   . Gallstones   . GERD 06/10/2007  . Hiatal hernia   . 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 HYPERTENSION 06/10/2007  . Irritable bowel syndrome 06/28/2008  . OSTEOARTHRITIS 06/10/2007  . Skin cancer    basal cell  . Tubular adenoma of colon    There were no vitals taken for this visit.  Opioid Risk Score:   Fall Risk Score:  `1  Depression screen PHQ 2/9  Depression screen [facility] 2/9 03/13/2019 02/07/2019 12/06/2018 09/22/2018 09/20/2018 03/28/2018 01/25/2018  Decreased Interest 1 1 1 1 1 3 3   Down, Depressed, Hopeless 1 1 1 1 1 3 2   PHQ - 2 Score 2 2 2 2 2 6 5   Altered sleeping - - - - 1 - 0  Tired, decreased energy - - - - 1 - 3  Change in appetite - - - - 1 - 1  Feeling bad or failure about yourself  - - - - 1 - 1  Trouble concentrating - - - - 1 - 2  Moving slowly or fidgety/restless - - - - 1 - 0  Suicidal thoughts - - - - 0 - 0  PHQ-9 Score - - - - 8 - 12  Difficult doing work/chores - - - - Extremely dIfficult - -  Some recent data might be hidden     Review of Systems  Constitutional: Positive for unexpected weight change.  HENT: Negative.   Eyes: Negative.   Respiratory: Negative.   Cardiovascular: Negative.   Gastrointestinal: Negative.   Endocrine: Negative.   Genitourinary: Negative.   Musculoskeletal: Positive for back pain, gait problem and myalgias.  Skin: Negative.   Allergic/Immunologic: Negative.   Neurological: Positive for dizziness, tremors, weakness and numbness.  Hematological: Negative.   Psychiatric/Behavioral: The patient is nervous/anxious.   All other systems reviewed and are negative.       Objective:   Physical Exam Vitals signs and nursing note reviewed.  Constitutional:      Appearance: Normal appearance.  Neck:     Musculoskeletal: Normal range of motion and neck supple.  Cardiovascular:     Rate and Rhythm: Normal rate and  regular rhythm.     Pulses: Normal pulses.     Heart sounds: Normal heart sounds.  Pulmonary:     Effort: Pulmonary effort is normal.     Breath sounds: Normal breath sounds.  Musculoskeletal:     Comments: Normal Muscle Bulk and Muscle Testing Reveals:  Upper Extremities: Full ROM and Muscle Strength 5/5  Lumbar Paraspinal Tenderness: L-3-L-5 Mainly Left Side Lower Extremities: Full ROM and Muscle Strength 5/5 Arises from Table with ease Narrow Based  Gait   Skin:    General: Skin is warm and dry.  Neurological:     Mental Status: She is alert and oriented to person, place, and time.  Psychiatric:        Mood and Affect: Mood normal.        Behavior: Behavior normal.           Assessment & Plan:  1. History of chronic lumbar facet disease with spondylosis/DDD/ L4-5 spondylolisthesis.With L4-5 L5-S1 decompression stabilization:06/07/2019 Continue: Hydrocodone 10/325mg  one tablet every 6 hours as needed #120. We will continue the opioid monitoring program, this consists of regular clinic visits, examinations, urine drug screen, pill counts as well as use of [place] Controlled Substance Reporting System. 2. Depression with anxiety/ PTSD: Dr. Sima Matas Following. 06/07/2019.Continue Xanax,Abilify and Effexor. 3. Cervical spondylosis: Stable at this time with no complaints. Continue Medication Regime.06/07/2019 4. 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 Continue Ritalin 10 mg one tablet twice a day 0700 and noon #60. Dr. Naaman Plummer note was reviewed, we discuss the slow titration in the fall, she verbalizes understanding. 06/07/2019 5. Muscle Spasm: Continuecurrent medication regimen  withFlexeril: May take 1-2 tablets at HS. 06/07/2019 6. Left Lumbar Radiculitis: No complaints today. Continue to Monitor.06/07/2019 7. Left Knee Osteoarthritis:No complaints today.S/PLeft Knee Injectionon 01/15/2020with good relief noted.06/07/2019  F/U in 1 month  54minutes of face to face patient care time was spent during this visit. All questions were encouraged and answered.

## 2019-06-11 ENCOUNTER — Encounter: Payer: Self-pay | Admitting: Registered Nurse

## 2019-07-07 ENCOUNTER — Encounter: Payer: Self-pay | Admitting: Registered Nurse

## 2019-07-07 ENCOUNTER — Encounter: Payer: BC Managed Care – PPO | Attending: Physical Medicine and Rehabilitation | Admitting: Registered Nurse

## 2019-07-07 ENCOUNTER — Other Ambulatory Visit: Payer: Self-pay

## 2019-07-07 VITALS — BP 138/96 | HR 98 | Temp 97.3°F | Ht 61.0 in | Wt 164.0 lb

## 2019-07-07 DIAGNOSIS — Z79899 Other long term (current) drug therapy: Secondary | ICD-10-CM

## 2019-07-07 DIAGNOSIS — F0781 Postconcussional syndrome: Secondary | ICD-10-CM | POA: Diagnosis not present

## 2019-07-07 DIAGNOSIS — M47816 Spondylosis without myelopathy or radiculopathy, lumbar region: Secondary | ICD-10-CM

## 2019-07-07 DIAGNOSIS — G8929 Other chronic pain: Secondary | ICD-10-CM

## 2019-07-07 DIAGNOSIS — M961 Postlaminectomy syndrome, not elsewhere classified: Secondary | ICD-10-CM

## 2019-07-07 DIAGNOSIS — F331 Major depressive disorder, recurrent, moderate: Secondary | ICD-10-CM

## 2019-07-07 DIAGNOSIS — M461 Sacroiliitis, not elsewhere classified: Secondary | ICD-10-CM | POA: Insufficient documentation

## 2019-07-07 DIAGNOSIS — M47817 Spondylosis without myelopathy or radiculopathy, lumbosacral region: Secondary | ICD-10-CM | POA: Insufficient documentation

## 2019-07-07 DIAGNOSIS — Z5181 Encounter for therapeutic drug level monitoring: Secondary | ICD-10-CM

## 2019-07-07 DIAGNOSIS — G894 Chronic pain syndrome: Secondary | ICD-10-CM

## 2019-07-07 DIAGNOSIS — M62838 Other muscle spasm: Secondary | ICD-10-CM

## 2019-07-07 DIAGNOSIS — F411 Generalized anxiety disorder: Secondary | ICD-10-CM | POA: Diagnosis not present

## 2019-07-07 DIAGNOSIS — M25511 Pain in right shoulder: Secondary | ICD-10-CM

## 2019-07-07 MED ORDER — METHYLPHENIDATE HCL 10 MG PO TABS: 10.0000 mg | ORAL_TABLET | Freq: Two times a day (BID) | ORAL | 0 refills | Status: DC

## 2019-07-07 MED ORDER — HYDROCODONE-ACETAMINOPHEN 10-325 MG PO TABS: 1.0000 | ORAL_TABLET | Freq: Four times a day (QID) | ORAL | 0 refills | Status: DC | PRN

## 2019-07-07 NOTE — Progress Notes (Signed)
Subjective:    Patient ID: [name], female    DOB: 11-26-1955, 63 y.o.   MRN: [id number]  HPI: [name] is a 63 y.o. female who returns for follow up appointment for chronic pain and medication refill. She states her pain is located in her right shoulder and lower back. She rates her pain 8. Her current exercise regime is walking and performing stretching exercises.  [name] equivalent is 40.00 MME.  Last Oral Swab was performed on 01/03/2019, it was consistent.  Pain Inventory Average Pain 8 Pain Right Now 8 My pain is sharp, burning, dull, stabbing, tingling and aching  In the last 24 hours, has pain interfered with the following? General activity 10 Relation with others 10 Enjoyment of life 10 What TIME of day is your pain at its worst? varies Sleep (in general) Fair  Pain is worse with: walking, sitting, inactivity, standing and some activites Pain improves with: rest, heat/ice, pacing activities and medication Relief from Meds: 9  Mobility walk with assistance use a cane ability to climb steps?  yes do you drive?  yes  Function disabled: date disabled . I need assistance with the following:  dressing, meal prep, household duties and shopping Do you have any goals in this area?  yes  Neuro/Psych bladder control problems weakness numbness tremor tingling trouble walking spasms dizziness anxiety  Prior Studies Any changes since last visit?  no  Physicians involved in your care Any changes since last visit?  no   Family History  Problem Relation Age of Onset  . Cervical cancer Mother   . Diabetes Mother   . Stroke Mother   . Alzheimer's disease Mother   . Heart attack Father   . Stroke Father   . Colon cancer Father   . Anxiety disorder Maternal Aunt   . Depression Maternal Aunt   . Anxiety disorder Maternal Uncle   . Depression Maternal Uncle        suicide attempt by gun shot wound to head. Survived  . Alcohol  abuse Other   . Drug abuse Other   . Breast cancer Maternal Grandmother   . Diabetes Maternal Grandmother   . Breast cancer Paternal Aunt   . Colon polyps Sister        x 2   Social History   Socioeconomic History  . Marital status: Married    Spouse name: Not on file  . Number of children: 0  . Years of education: Not on file  . Highest education level: Not on file  Occupational History  . Occupation: disabled  Social Needs  . Financial resource strain: Not on file  . Food insecurity    Worry: Not on file    Inability: Not on file  . Transportation needs    Medical: Not on file    Non-medical: Not on file  Tobacco Use  . Smoking status: Never Smoker  . Smokeless tobacco: Never Used  Substance and Sexual Activity  . Alcohol use: No    Alcohol/week: 0.0 standard drinks  . Drug use: No  . Sexual activity: Yes  Lifestyle  . Physical activity    Days per week: Not on file    Minutes per session: Not on file  . Stress: Not on file  Relationships  . Social Herbalist on phone: Not on file    Gets together: Not on file    Attends religious service: Not on file  Active member of club or organization: Not on file    Attends meetings of clubs or organizations: Not on file    Relationship status: Not on file  Other Topics Concern  . Not on file  Social History Narrative   Pt has h.s. degree   Past Surgical History:  Procedure Laterality Date  . ABDOMINAL HYSTERECTOMY  1999  . bunions removed  1988  . C4 C5 cage insertion  06/28/2013  . CHOLECYSTECTOMY  04/25/2008  . EDG  12/17/2004  . ELECTROCARDIOGRAM  05/27/2007  . L5 S1 rod insertion  11/07/2012  . SKIN CANCER EXCISION    . SKIN CANCER EXCISION     several  . TONSILLECTOMY  1981  . TUBAL LIGATION  1997   Past Medical History:  Diagnosis Date  . Anemia   . ANXIETY 06/10/2007  . Chronic headaches   . Degenerative arthritis of spine    back and neck  . Depression   . DYSLIPIDEMIA 06/28/2008  .  Esophageal stricture   . Fibromyalgia   . Gallstones   . GERD 06/10/2007  . Hiatal hernia   . 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 HYPERTENSION 06/10/2007  . Irritable bowel syndrome 06/28/2008  . OSTEOARTHRITIS 06/10/2007  . Skin cancer    basal cell  . Tubular adenoma of colon    BP (!) 138/96   Pulse 98   Temp (!) 97.3 F (36.3 C)   Ht 5\' 1"  (1.549 m)   Wt 164 lb (74.4 kg)   SpO2 97%   BMI 30.99 kg/m   Opioid Risk Score:   Fall Risk Score:  `1  Depression screen PHQ 2/9  Depression screen [facility] 2/9 03/13/2019 02/07/2019 12/06/2018 09/22/2018 09/20/2018 03/28/2018 01/25/2018  Decreased Interest 1 1 1 1 1 3 3   Down, Depressed, Hopeless 1 1 1 1 1 3 2   PHQ - 2 Score 2 2 2 2 2 6 5   Altered sleeping - - - - 1 - 0  Tired, decreased energy - - - - 1 - 3  Change in appetite - - - - 1 - 1  Feeling bad or failure about yourself  - - - - 1 - 1  Trouble concentrating - - - - 1 - 2  Moving slowly or fidgety/restless - - - - 1 - 0  Suicidal thoughts - - - - 0 - 0  PHQ-9 Score - - - - 8 - 12  Difficult doing work/chores - - - - Extremely dIfficult - -  Some recent data might be hidden    Review of Systems  Constitutional: Positive for unexpected weight change.  HENT: Negative.   Eyes: Negative.   Respiratory: Positive for shortness of breath.   Cardiovascular: Positive for leg swelling.  Gastrointestinal: Negative.   Endocrine: Negative.   Genitourinary: Positive for difficulty urinating.  Musculoskeletal: Positive for arthralgias, back pain and neck pain.       Spasms   Allergic/Immunologic: Negative.   Neurological: Positive for dizziness, tremors, weakness and numbness.       Tingling  Psychiatric/Behavioral: The patient is nervous/anxious.   All other systems reviewed and are negative.      Objective:   Physical Exam Vitals signs and nursing note reviewed.  Constitutional:      Appearance: Normal appearance.  Neck:     Musculoskeletal: Normal range of motion and neck supple.   Cardiovascular:     Rate and Rhythm: Normal rate and regular rhythm.     Pulses: Normal pulses.  Heart sounds: Normal heart sounds.  Pulmonary:     Effort: Pulmonary effort is normal.     Breath sounds: Normal breath sounds.  Musculoskeletal:     Comments: Normal Muscle Bulk and Muscle Testing Reveals:  Upper Extremities: Full ROM and Muscle Strength 5/5 Right ac joint tenderness Lumbar Hypersensitivity Lower Extremities: Full ROM and Muscle Strength 5/5 Arises from table with ease Narrow Based  Gait   Skin:    General: Skin is warm and dry.  Neurological:     Mental Status: She is alert and oriented to person, place, and time.  Psychiatric:        Mood and Affect: Mood normal.        Behavior: Behavior normal.           Assessment & Plan:  1. History of chronic lumbar facet disease with spondylosis/DDD/ L4-5 spondylolisthesis.With L4-5 L5-S1 decompression stabilization:07/07/2019 Continue: Hydrocodone 10/325mg  one tablet every 6 hours as needed #120. We will continue the opioid monitoring program, this consists of regular clinic visits, examinations, urine drug screen, pill counts as well as use of [place] Controlled Substance Reporting System. 2. Depression with anxiety/ PTSD: Dr. Sima Matas Following. 07/07/2019.Continue Xanax,Abilifyand Effexor. 3. Cervical spondylosis: Stable at this time with no complaints. Continue Medication Regime.07/07/2019 4. 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 Continue Ritalin 10 mg one tablet twice a day 0700 and noon #60. Dr. Naaman Plummer note was reviewed, we discuss the slow titration in the fall, she verbalizes understanding. 07/07/2019 5. Muscle Spasm: Continuecurrent medication regimen withFlexeril: May take 1-2 tablets at HS. 07/07/2019 6. Left Lumbar Radiculitis:No complaints today.Continue to Monitor.07/07/2019 7. Left Knee Osteoarthritis:No complaints today.S/PLeft Knee  Injectionon 01/15/2020with good relief noted.07/07/2019 8. Right Shoulder pain: Continue hep as tolerated. Continue to monitor. F/U in 1 month  33minutes of face to face patient care time was spent during this visit. All questions were encouraged and answered.

## 2019-08-02 ENCOUNTER — Other Ambulatory Visit: Payer: Self-pay

## 2019-08-02 ENCOUNTER — Encounter: Payer: BC Managed Care – PPO | Attending: Physical Medicine and Rehabilitation | Admitting: Registered Nurse

## 2019-08-02 ENCOUNTER — Encounter: Payer: Self-pay | Admitting: Registered Nurse

## 2019-08-02 VITALS — BP 147/89 | HR 89 | Temp 97.7°F | Ht 61.0 in | Wt 166.0 lb

## 2019-08-02 DIAGNOSIS — Z79891 Long term (current) use of opiate analgesic: Secondary | ICD-10-CM

## 2019-08-02 DIAGNOSIS — M62838 Other muscle spasm: Secondary | ICD-10-CM

## 2019-08-02 DIAGNOSIS — Z5181 Encounter for therapeutic drug level monitoring: Secondary | ICD-10-CM

## 2019-08-02 DIAGNOSIS — M461 Sacroiliitis, not elsewhere classified: Secondary | ICD-10-CM | POA: Diagnosis not present

## 2019-08-02 DIAGNOSIS — F411 Generalized anxiety disorder: Secondary | ICD-10-CM | POA: Diagnosis not present

## 2019-08-02 DIAGNOSIS — M961 Postlaminectomy syndrome, not elsewhere classified: Secondary | ICD-10-CM

## 2019-08-02 DIAGNOSIS — G894 Chronic pain syndrome: Secondary | ICD-10-CM | POA: Diagnosis not present

## 2019-08-02 DIAGNOSIS — M5416 Radiculopathy, lumbar region: Secondary | ICD-10-CM

## 2019-08-02 DIAGNOSIS — M47817 Spondylosis without myelopathy or radiculopathy, lumbosacral region: Secondary | ICD-10-CM | POA: Diagnosis not present

## 2019-08-02 DIAGNOSIS — M47816 Spondylosis without myelopathy or radiculopathy, lumbar region: Secondary | ICD-10-CM

## 2019-08-02 DIAGNOSIS — F0781 Postconcussional syndrome: Secondary | ICD-10-CM

## 2019-08-02 DIAGNOSIS — F331 Major depressive disorder, recurrent, moderate: Secondary | ICD-10-CM

## 2019-08-02 MED ORDER — ALPRAZOLAM 1 MG PO TABS: ORAL_TABLET | ORAL | 2 refills | Status: DC

## 2019-08-02 MED ORDER — HYDROCODONE-ACETAMINOPHEN 10-325 MG PO TABS: 1.0000 | ORAL_TABLET | Freq: Four times a day (QID) | ORAL | 0 refills | Status: DC | PRN

## 2019-08-02 MED ORDER — METHYLPHENIDATE HCL 10 MG PO TABS: 10.0000 mg | ORAL_TABLET | Freq: Two times a day (BID) | ORAL | 0 refills | Status: DC

## 2019-08-02 NOTE — Progress Notes (Signed)
Subjective:    Patient ID: [name], female    DOB: [birth year], 63 y.o.   MRN: [id number]  HPI: [name] is a 63 y.o. female who returns for follow up appointment for chronic pain and medication refill. She states her pain is located in her lower back radiating into her left lower extremity. She rates her pain 8. Her current exercise regime is walking 11/2 mile a day.   Ms. Ratterree Morphine equivalent is 40.00  MME.She is also prescribed Alprazolam. We have discussed the black box warning of using opioids and benzodiazepines. I highlighted the dangers of using these drugs together and discussed the adverse events including respiratory suppression, overdose, cognitive impairment and importance of compliance with current regimen. We will continue to monitor and adjust as indicated.   Oral Swab was Performed today.    Pain Inventory Average Pain 9 Pain Right Now 8 My pain is sharp, burning, dull, stabbing, tingling and aching  In the last 24 hours, has pain interfered with the following? General activity 10 Relation with others 10 Enjoyment of life 10 What TIME of day is your pain at its worst? ? Sleep (in general) Fair  Pain is worse with: walking, sitting, inactivity, standing and some activites Pain improves with: rest, heat/ice, pacing activities and medication Relief from Meds: 9  Mobility walk with assistance use a cane ability to climb steps?  yes do you drive?  yes Do you have any goals in this area?  yes  Function disabled: date disabled . I need assistance with the following:  meal prep, household duties and shopping Do you have any goals in this area?  yes  Neuro/Psych bladder control problems weakness numbness tremor tingling trouble walking spasms dizziness confusion depression anxiety  Prior Studies Any changes since last visit?  no  Physicians involved in your care Any changes since last visit?  no   Family History  Problem  Relation Age of Onset  . Cervical cancer Mother   . Diabetes Mother   . Stroke Mother   . Alzheimer's disease Mother   . Heart attack Father   . Stroke Father   . Colon cancer Father   . Anxiety disorder Maternal Aunt   . Depression Maternal Aunt   . Anxiety disorder Maternal Uncle   . Depression Maternal Uncle        suicide attempt by gun shot wound to head. Survived  . Alcohol abuse Other   . Drug abuse Other   . Breast cancer Maternal Grandmother   . Diabetes Maternal Grandmother   . Breast cancer Paternal Aunt   . Colon polyps Sister        x 2   Social History   Socioeconomic History  . Marital status: Married    Spouse name: Not on file  . Number of children: 0  . Years of education: Not on file  . Highest education level: Not on file  Occupational History  . Occupation: disabled  Social Needs  . Financial resource strain: Not on file  . Food insecurity    Worry: Not on file    Inability: Not on file  . Transportation needs    Medical: Not on file    Non-medical: Not on file  Tobacco Use  . Smoking status: Never Smoker  . Smokeless tobacco: Never Used  Substance and Sexual Activity  . Alcohol use: No    Alcohol/week: 0.0 standard drinks  . Drug use: No  . Sexual  activity: Yes  Lifestyle  . Physical activity    Days per week: Not on file    Minutes per session: Not on file  . Stress: Not on file  Relationships  . Social Herbalist on phone: Not on file    Gets together: Not on file    Attends religious service: Not on file    Active member of club or organization: Not on file    Attends meetings of clubs or organizations: Not on file    Relationship status: Not on file  Other Topics Concern  . Not on file  Social History Narrative   Pt has h.s. degree   Past Surgical History:  Procedure Laterality Date  . ABDOMINAL HYSTERECTOMY  1999  . bunions removed  1988  . C4 C5 cage insertion  06/28/2013  . CHOLECYSTECTOMY  04/25/2008  . EDG   12/17/2004  . ELECTROCARDIOGRAM  05/27/2007  . L5 S1 rod insertion  11/07/2012  . SKIN CANCER EXCISION    . SKIN CANCER EXCISION     several  . TONSILLECTOMY  1981  . TUBAL LIGATION  1997   Past Medical History:  Diagnosis Date  . Anemia   . ANXIETY 06/10/2007  . Chronic headaches   . Degenerative arthritis of spine    back and neck  . Depression   . DYSLIPIDEMIA 06/28/2008  . Esophageal stricture   . Fibromyalgia   . Gallstones   . GERD 06/10/2007  . Hiatal hernia   . 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 HYPERTENSION 06/10/2007  . Irritable bowel syndrome 06/28/2008  . OSTEOARTHRITIS 06/10/2007  . Skin cancer    basal cell  . Tubular adenoma of colon    BP (!) 147/89   Pulse 89   Temp 97.7 F (36.5 C)   Ht 5\' 1"  (1.549 m)   Wt 166 lb (75.3 kg)   SpO2 95%   BMI 31.37 kg/m   Opioid Risk Score:   Fall Risk Score:  `1  Depression screen PHQ 2/9  Depression screen [place] 2/9 03/13/2019 02/07/2019 12/06/2018 09/22/2018 09/20/2018 03/28/2018 01/25/2018  Decreased Interest 1 1 1 1 1 3 3   Down, Depressed, Hopeless 1 1 1 1 1 3 2   PHQ - 2 Score 2 2 2 2 2 6 5   Altered sleeping - - - - 1 - 0  Tired, decreased energy - - - - 1 - 3  Change in appetite - - - - 1 - 1  Feeling bad or failure about yourself  - - - - 1 - 1  Trouble concentrating - - - - 1 - 2  Moving slowly or fidgety/restless - - - - 1 - 0  Suicidal thoughts - - - - 0 - 0  PHQ-9 Score - - - - 8 - 12  Difficult doing work/chores - - - - Extremely dIfficult - -  Some recent data might be hidden    Review of Systems  Constitutional: Negative.   HENT: Negative.   Eyes: Negative.   Respiratory: Negative.   Cardiovascular: Negative.   Gastrointestinal: Negative.   Endocrine: Negative.   Genitourinary: Negative.   Musculoskeletal: Positive for arthralgias, back pain, gait problem and neck pain.       Spasms   Skin: Negative.   Allergic/Immunologic: Negative.   Neurological: Positive for dizziness, tremors, weakness and numbness.        Tingling  Hematological: Negative.   Psychiatric/Behavioral: The patient is nervous/anxious.   All other systems  reviewed and are negative.      Objective:   Physical Exam Vitals signs and nursing note reviewed.  Constitutional:      Appearance: Normal appearance.  Neck:     Musculoskeletal: Normal range of motion and neck supple.  Cardiovascular:     Rate and Rhythm: Normal rate and regular rhythm.     Pulses: Normal pulses.     Heart sounds: Normal heart sounds.  Pulmonary:     Effort: Pulmonary effort is normal.     Breath sounds: Normal breath sounds.  Musculoskeletal:     Comments: Normal Muscle Bulk and Muscle Testing Reveals:  Upper Extremities: Full ROM and Muscle Strength 5/5 Lumbar Paraspinal Tenderness: L-4-L-5 Lower Extremities: Full ROM and Muscle Strength 5/5 Arises from Table with ease Narrow Based Gait   Skin:    General: Skin is warm and dry.  Neurological:     Mental Status: She is alert and oriented to person, place, and time.  Psychiatric:        Mood and Affect: Mood normal.        Behavior: Behavior normal.           Assessment & Plan:  1. History of chronic lumbar facet disease with spondylosis/DDD/ L4-5 spondylolisthesis.With L4-5 L5-S1 decompression stabilization:08/02/2019 Continue: Hydrocodone 10/325mg  one tablet every 6 hours as needed #120. We will continue the opioid monitoring program, this consists of regular clinic visits, examinations, urine drug screen, pill counts as well as use of [place] Controlled Substance Reporting System. 2. Depression with anxiety/ PTSD: Dr. Sima Matas Following. 08/02/2019.Continue Xanax,Abilifyand Effexor. 3. Cervical spondylosis: Stable at this time with no complaints. Continue Medication Regime.08/02/2019 4. 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 Continue Ritalin 10 mg one tablet twice a day 0700 and noon #60. Dr. Naaman Plummer note was reviewed, we discuss  the slow titration, will began in 29-Nov-2022 due to the death of her mother, she verbalizes understanding. 08/02/2019 5. Muscle Spasm: Continuecurrent medication regimen withFlexeril: May take 1-2 tablets at HS. 08/02/2019 6. Left Lumbar Radiculitis:Continue to Monitor.08/02/2019 7. Left Knee Osteoarthritis:No complaints today.S/PLeft Knee Injectionon 01/15/2020with good relief noted.08/02/2019 8. Right Shoulder pain: No complaints today.Continue hep as tolerated. Continue to monitor.  F/U in 1 month  32minutes of face to face patient care time was spent during this visit. All questions were encouraged and answered.

## 2019-08-04 DIAGNOSIS — Z23 Encounter for immunization: Secondary | ICD-10-CM | POA: Diagnosis not present

## 2019-08-07 LAB — DRUG TOX MONITOR 1 W/CONF, ORAL FLD
Alprazolam: 2.36 ng/mL — ABNORMAL HIGH (ref <0.50)
Amphetamines: NEGATIVE ng/mL (ref <10)
Barbiturates: NEGATIVE ng/mL (ref <10)
Benzodiazepines: POSITIVE ng/mL — AB (ref <0.50)
Buprenorphine: NEGATIVE ng/mL (ref <0.10)
Chlordiazepoxide: NEGATIVE ng/mL (ref <0.50)
Clonazepam: NEGATIVE ng/mL (ref <0.50)
Cocaine: NEGATIVE ng/mL (ref <5.0)
Diazepam: NEGATIVE ng/mL (ref <0.50)
Dihydrocodeine: 57.9 ng/mL — ABNORMAL HIGH (ref <2.5)
Fentanyl: NEGATIVE ng/mL (ref <0.10)
Flunitrazepam: NEGATIVE ng/mL (ref <0.50)
Flurazepam: NEGATIVE ng/mL (ref <0.50)
Heroin Metabolite: NEGATIVE ng/mL (ref <1.0)
Hydrocodone: >250 ng/mL — ABNORMAL HIGH (ref <2.5)
Hydromorphone: NEGATIVE ng/mL (ref <2.5)
Lorazepam: NEGATIVE ng/mL (ref <0.50)
MARIJUANA: NEGATIVE ng/mL (ref <2.5)
MDMA: NEGATIVE ng/mL (ref <10)
Meprobamate: NEGATIVE ng/mL (ref <2.5)
Methadone: NEGATIVE ng/mL (ref <5.0)
Midazolam: NEGATIVE ng/mL (ref <0.50)
Morphine: NEGATIVE ng/mL (ref <2.5)
Nicotine Metabolite: NEGATIVE ng/mL (ref <5.0)
Nordiazepam: NEGATIVE ng/mL (ref <0.50)
Norhydrocodone: 60.2 ng/mL — ABNORMAL HIGH (ref <2.5)
Noroxycodone: NEGATIVE ng/mL (ref <2.5)
Opiates: POSITIVE ng/mL — AB (ref <2.5)
Oxazepam: NEGATIVE ng/mL (ref <0.50)
Oxycodone: NEGATIVE ng/mL (ref <2.5)
Oxymorphone: NEGATIVE ng/mL (ref <2.5)
Phencyclidine: NEGATIVE ng/mL (ref <10)
Tapentadol: NEGATIVE ng/mL (ref <5.0)
Temazepam: NEGATIVE ng/mL (ref <0.50)
Tramadol: NEGATIVE ng/mL (ref <5.0)
Triazolam: NEGATIVE ng/mL (ref <0.50)
Zolpidem: NEGATIVE ng/mL (ref <5.0)

## 2019-08-07 LAB — DRUG TOX METHYLPHEN W/CONF,ORAL FLD
Methylphenidate: 79.7 ng/mL — ABNORMAL HIGH (ref <1.0)
Methylphenidate: POSITIVE ng/mL — AB (ref <1.0)

## 2019-08-07 LAB — DRUG TOX ALC METAB W/CON, ORAL FLD: Alcohol Metabolite: NEGATIVE ng/mL (ref <25)

## 2019-08-09 ENCOUNTER — Telehealth: Payer: Self-pay | Admitting: *Deleted

## 2019-08-09 NOTE — Telephone Encounter (Signed)
Oral swab drug screen was consistent for prescribed medications.  ?

## 2019-08-29 ENCOUNTER — Encounter: Payer: BC Managed Care – PPO | Attending: Physical Medicine and Rehabilitation | Admitting: Registered Nurse

## 2019-08-29 ENCOUNTER — Other Ambulatory Visit: Payer: Self-pay

## 2019-08-29 VITALS — BP 123/79 | HR 97 | Temp 97.5°F | Ht 61.0 in | Wt 166.0 lb

## 2019-08-29 DIAGNOSIS — F0781 Postconcussional syndrome: Secondary | ICD-10-CM

## 2019-08-29 DIAGNOSIS — M47816 Spondylosis without myelopathy or radiculopathy, lumbar region: Secondary | ICD-10-CM

## 2019-08-29 DIAGNOSIS — M47817 Spondylosis without myelopathy or radiculopathy, lumbosacral region: Secondary | ICD-10-CM

## 2019-08-29 DIAGNOSIS — Z79891 Long term (current) use of opiate analgesic: Secondary | ICD-10-CM

## 2019-08-29 DIAGNOSIS — M961 Postlaminectomy syndrome, not elsewhere classified: Secondary | ICD-10-CM

## 2019-08-29 DIAGNOSIS — M461 Sacroiliitis, not elsewhere classified: Secondary | ICD-10-CM | POA: Insufficient documentation

## 2019-08-29 DIAGNOSIS — G894 Chronic pain syndrome: Secondary | ICD-10-CM

## 2019-08-29 DIAGNOSIS — Z5181 Encounter for therapeutic drug level monitoring: Secondary | ICD-10-CM

## 2019-08-29 DIAGNOSIS — F411 Generalized anxiety disorder: Secondary | ICD-10-CM

## 2019-08-29 DIAGNOSIS — F331 Major depressive disorder, recurrent, moderate: Secondary | ICD-10-CM

## 2019-08-29 DIAGNOSIS — M5416 Radiculopathy, lumbar region: Secondary | ICD-10-CM

## 2019-08-29 MED ORDER — METHYLPHENIDATE HCL 10 MG PO TABS: 10.0000 mg | ORAL_TABLET | Freq: Two times a day (BID) | ORAL | 0 refills | Status: DC

## 2019-08-29 MED ORDER — HYDROCODONE-ACETAMINOPHEN 10-325 MG PO TABS: 1.0000 | ORAL_TABLET | Freq: Four times a day (QID) | ORAL | 0 refills | Status: DC | PRN

## 2019-08-29 NOTE — Progress Notes (Signed)
Subjective:    Patient ID: [name], female    DOB: 20-Oct-1955, 63 y.o.   MRN: [id number]  HPI: [name] is a 63 y.o. female who returns for follow up appointment for chronic pain and medication refill. She states her pain is located in her mid-lower back radiating into her left lower extremity. She rates her pain 8. Her current exercise regime is walking 2 miles a day and performing stretching exercises.  [name] Morphine equivalent is 40.00  MME. She  is also prescribed Alprazolam. We have discussed the black box warning of using opioids and benzodiazepines. I highlighted the dangers of using these drugs together and discussed the adverse events including respiratory suppression, overdose, cognitive impairment and importance of compliance with current regimen. We will continue to monitor and adjust as indicated.   Last Oral Swab was Performed on 08/02/2019. It was consistent.     Pain Inventory Average Pain 8 Pain Right Now 8 My pain is sharp, burning, dull, stabbing, tingling and aching  In the last 24 hours, has pain interfered with the following? General activity 10 Relation with others 10 Enjoyment of life 10 What TIME of day is your pain at its worst? all Sleep (in general) Fair  Pain is worse with: walking, sitting, inactivity, standing and some activites Pain improves with: rest, heat/ice, pacing activities and medication Relief from Meds: 9  Mobility walk without assistance use a cane how many minutes can you walk? varies ability to climb steps?  yes do you drive?  yes Do you have any goals in this area?  yes  Function disabled: date disabled . I need assistance with the following:  dressing, meal prep, household duties and shopping  Neuro/Psych bladder control problems weakness numbness tremor tingling trouble walking spasms dizziness anxiety  Prior Studies Any changes since last visit?  no  Physicians involved in your care Any  changes since last visit?  no   Family History  Problem Relation Age of Onset  . Cervical cancer Mother   . Diabetes Mother   . Stroke Mother   . Alzheimer's disease Mother   . Heart attack Father   . Stroke Father   . Colon cancer Father   . Anxiety disorder Maternal Aunt   . Depression Maternal Aunt   . Anxiety disorder Maternal Uncle   . Depression Maternal Uncle        suicide attempt by gun shot wound to head. Survived  . Alcohol abuse Other   . Drug abuse Other   . Breast cancer Maternal Grandmother   . Diabetes Maternal Grandmother   . Breast cancer Paternal Aunt   . Colon polyps Sister        x 2   Social History   Socioeconomic History  . Marital status: Married    Spouse name: Not on file  . Number of children: 0  . Years of education: Not on file  . Highest education level: Not on file  Occupational History  . Occupation: disabled  Social Needs  . Financial resource strain: Not on file  . Food insecurity    Worry: Not on file    Inability: Not on file  . Transportation needs    Medical: Not on file    Non-medical: Not on file  Tobacco Use  . Smoking status: Never Smoker  . Smokeless tobacco: Never Used  Substance and Sexual Activity  . Alcohol use: No    Alcohol/week: 0.0 standard drinks  .  Drug use: No  . Sexual activity: Yes  Lifestyle  . Physical activity    Days per week: Not on file    Minutes per session: Not on file  . Stress: Not on file  Relationships  . Social Herbalist on phone: Not on file    Gets together: Not on file    Attends religious service: Not on file    Active member of club or organization: Not on file    Attends meetings of clubs or organizations: Not on file    Relationship status: Not on file  Other Topics Concern  . Not on file  Social History Narrative   Pt has h.s. degree   Past Surgical History:  Procedure Laterality Date  . ABDOMINAL HYSTERECTOMY  1999  . bunions removed  1988  . C4 C5 cage  insertion  06/28/2013  . CHOLECYSTECTOMY  04/25/2008  . EDG  12/17/2004  . ELECTROCARDIOGRAM  05/27/2007  . L5 S1 rod insertion  11/07/2012  . SKIN CANCER EXCISION    . SKIN CANCER EXCISION     several  . TONSILLECTOMY  1981  . TUBAL LIGATION  1997   Past Medical History:  Diagnosis Date  . Anemia   . ANXIETY 06/10/2007  . Chronic headaches   . Degenerative arthritis of spine    back and neck  . Depression   . DYSLIPIDEMIA 06/28/2008  . Esophageal stricture   . Fibromyalgia   . Gallstones   . GERD 06/10/2007  . Hiatal hernia   . 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 HYPERTENSION 06/10/2007  . Irritable bowel syndrome 06/28/2008  . OSTEOARTHRITIS 06/10/2007  . Skin cancer    basal cell  . Tubular adenoma of colon    BP 123/79   Pulse 97   Temp (!) 97.5 F (36.4 C)   Ht 5\' 1"  (1.549 m)   Wt 166 lb (75.3 kg)   SpO2 99%   BMI 31.37 kg/m   Opioid Risk Score:   Fall Risk Score:  `1  Depression screen PHQ 2/9  Depression screen [facility] 2/9 03/13/2019 02/07/2019 12/06/2018 09/22/2018 09/20/2018 03/28/2018 01/25/2018  Decreased Interest 1 1 1 1 1 3 3   Down, Depressed, Hopeless 1 1 1 1 1 3 2   PHQ - 2 Score 2 2 2 2 2 6 5   Altered sleeping - - - - 1 - 0  Tired, decreased energy - - - - 1 - 3  Change in appetite - - - - 1 - 1  Feeling bad or failure about yourself  - - - - 1 - 1  Trouble concentrating - - - - 1 - 2  Moving slowly or fidgety/restless - - - - 1 - 0  Suicidal thoughts - - - - 0 - 0  PHQ-9 Score - - - - 8 - 12  Difficult doing work/chores - - - - Extremely dIfficult - -  Some recent data might be hidden    Review of Systems  Constitutional: Positive for unexpected weight change.  HENT: Negative.   Eyes: Negative.   Respiratory: Negative.   Cardiovascular: Negative.   Gastrointestinal: Negative.   Endocrine: Negative.   Genitourinary: Negative.   Musculoskeletal: Positive for arthralgias, back pain and gait problem.       Spasms   Allergic/Immunologic: Negative.   Neurological:  Positive for dizziness, tremors, weakness and numbness.       Tingling  Hematological: Negative.   Psychiatric/Behavioral: The patient is nervous/anxious.  All other systems reviewed and are negative.      Objective:   Physical Exam Vitals signs reviewed.  Constitutional:      Appearance: Normal appearance.  HENT:     Head: Atraumatic.  Neck:     Musculoskeletal: Normal range of motion and neck supple.  Cardiovascular:     Rate and Rhythm: Normal rate and regular rhythm.     Pulses: Normal pulses.     Heart sounds: Normal heart sounds.  Pulmonary:     Effort: Pulmonary effort is normal.     Breath sounds: Normal breath sounds.  Musculoskeletal:     Comments: Normal Muscle Bulk and Muscle Testing Reveals:  Upper Extremities: Full ROM and Muscle Strength 5/5 Lumbar Paraspinal Tenderness: L-3-L-5  Lower Extremities: Full ROM and Muscle Strength 5/5 Arises from chair with ease Narrow Based Gait   Skin:    General: Skin is warm and dry.  Neurological:     Mental Status: She is oriented to person, place, and time.  Psychiatric:        Mood and Affect: Mood normal.        Behavior: Behavior normal.           Assessment & Plan:  1. History of chronic lumbar facet disease with spondylosis/DDD/ L4-5 spondylolisthesis.With L4-5 L5-S1 decompression stabilization:08/29/2019 Continue: Hydrocodone 10/325mg  one tablet every 6 hours as needed #120. We will continue the opioid monitoring program, this consists of regular clinic visits, examinations, urine drug screen, pill counts as well as use of [place] Controlled Substance Reporting System. 2. Depression with anxiety/ PTSD: Dr. Sima Matas Following. 08/29/2019.Continue Xanax,Abilifyand Effexor. 3. Cervical spondylosis: Stable at this time with no complaints. Continue Medication Regime.08/29/2019 4. 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 Continue Ritalin 10 mg one tablet  twice a day 0700 and noon #60. Dr. Naaman Plummer note was reviewed, we discuss the slow titration in the fall, she verbalizes understanding. 08/29/2019 5. Muscle Spasm: Continuecurrent medication regimen withFlexeril: May take 1-2 tablets at HS. 08/29/2019 6. Left Lumbar Radiculitis:Continue to Monitor.08/29/2019 7. Left Knee Osteoarthritis:No complaints today.S/PLeft Knee Injectionon 01/15/2020with good relief noted.08/29/2019 8. Right Shoulder pain: No complaints today. Continue hep as tolerated. Continue to monitor. 08/29/2019 F/U in 1 month  43minutes of face to face patient care time was spent during this visit. All questions were encouraged and answered.

## 2019-08-31 ENCOUNTER — Encounter: Payer: BC Managed Care – PPO | Admitting: Registered Nurse

## 2019-08-31 ENCOUNTER — Other Ambulatory Visit: Payer: Self-pay | Admitting: Registered Nurse

## 2019-08-31 ENCOUNTER — Encounter: Payer: Self-pay | Admitting: Registered Nurse

## 2019-09-27 ENCOUNTER — Ambulatory Visit: Payer: BC Managed Care – PPO | Admitting: Physical Medicine & Rehabilitation

## 2019-09-27 ENCOUNTER — Encounter: Payer: Self-pay | Admitting: Registered Nurse

## 2019-09-27 ENCOUNTER — Encounter: Payer: BC Managed Care – PPO | Attending: Physical Medicine and Rehabilitation | Admitting: Registered Nurse

## 2019-09-27 ENCOUNTER — Other Ambulatory Visit: Payer: Self-pay

## 2019-09-27 VITALS — BP 128/82 | HR 99 | Temp 97.8°F | Ht 61.0 in | Wt 170.0 lb

## 2019-09-27 DIAGNOSIS — Z5181 Encounter for therapeutic drug level monitoring: Secondary | ICD-10-CM

## 2019-09-27 DIAGNOSIS — M5416 Radiculopathy, lumbar region: Secondary | ICD-10-CM

## 2019-09-27 DIAGNOSIS — M47816 Spondylosis without myelopathy or radiculopathy, lumbar region: Secondary | ICD-10-CM

## 2019-09-27 DIAGNOSIS — G894 Chronic pain syndrome: Secondary | ICD-10-CM

## 2019-09-27 DIAGNOSIS — F331 Major depressive disorder, recurrent, moderate: Secondary | ICD-10-CM

## 2019-09-27 DIAGNOSIS — F0781 Postconcussional syndrome: Secondary | ICD-10-CM | POA: Insufficient documentation

## 2019-09-27 DIAGNOSIS — F411 Generalized anxiety disorder: Secondary | ICD-10-CM | POA: Insufficient documentation

## 2019-09-27 DIAGNOSIS — M461 Sacroiliitis, not elsewhere classified: Secondary | ICD-10-CM | POA: Insufficient documentation

## 2019-09-27 DIAGNOSIS — Z79891 Long term (current) use of opiate analgesic: Secondary | ICD-10-CM

## 2019-09-27 DIAGNOSIS — M47817 Spondylosis without myelopathy or radiculopathy, lumbosacral region: Secondary | ICD-10-CM | POA: Insufficient documentation

## 2019-09-27 DIAGNOSIS — M961 Postlaminectomy syndrome, not elsewhere classified: Secondary | ICD-10-CM

## 2019-09-27 DIAGNOSIS — M62838 Other muscle spasm: Secondary | ICD-10-CM

## 2019-09-27 MED ORDER — METHYLPHENIDATE HCL 10 MG PO TABS: 10.0000 mg | ORAL_TABLET | Freq: Two times a day (BID) | ORAL | 0 refills | Status: DC

## 2019-09-27 MED ORDER — CYCLOBENZAPRINE HCL 5 MG PO TABS: 5.0000 mg | ORAL_TABLET | Freq: Every evening | ORAL | 3 refills | Status: DC | PRN

## 2019-09-27 MED ORDER — HYDROCODONE-ACETAMINOPHEN 10-325 MG PO TABS: 1.0000 | ORAL_TABLET | Freq: Four times a day (QID) | ORAL | 0 refills | Status: DC | PRN

## 2019-09-27 MED ORDER — ALPRAZOLAM 1 MG PO TABS: ORAL_TABLET | ORAL | 2 refills | Status: DC

## 2019-09-27 NOTE — Progress Notes (Signed)
Subjective:    Patient ID: [name], female    DOB: 07-02-1956, 63 y.o.   MRN: [id number]  HPI: [name] is a 63 y.o. female who returns for follow up appointment for chronic pain and medication refill. She states her pain is located in her lower back radiating into her left buttock and left lower extremity. She rates her pain 8. Her current exercise regime is walking and performing stretching exercises.  [name] Morphine equivalent is 40.00 MME. She  is also prescribed Alprazolam . We have discussed the black box warning of using opioids and benzodiazepines. I highlighted the dangers of using these drugs together and discussed the adverse events including respiratory suppression, overdose, cognitive impairment and importance of compliance with current regimen. We will continue to monitor and adjust as indicated.     Last Oral Swab was Performed on 08/02/2019, it was consistent.   Pain Inventory Average Pain 9 Pain Right Now 8 My pain is sharp, burning, dull, stabbing, tingling and aching  In the last 24 hours, has pain interfered with the following? General activity 10 Relation with others 10 Enjoyment of life 10 What TIME of day is your pain at its worst? . Sleep (in general) Fair  Pain is worse with: walking, sitting, inactivity, standing and some activites Pain improves with: rest, heat/ice, pacing activities and medication Relief from Meds: 9  Mobility use a cane ability to climb steps?  yes do you drive?  yes  Function disabled: date disabled . I need assistance with the following:  dressing, meal prep, household duties and shopping  Neuro/Psych bladder control problems weakness numbness tremor tingling trouble walking spasms dizziness anxiety  Prior Studies Any changes since last visit?  no  Physicians involved in your care Any changes since last visit?  no   Family History  Problem Relation Age of Onset  . Cervical cancer Mother     . Diabetes Mother   . Stroke Mother   . Alzheimer's disease Mother   . Heart attack Father   . Stroke Father   . Colon cancer Father   . Anxiety disorder Maternal Aunt   . Depression Maternal Aunt   . Anxiety disorder Maternal Uncle   . Depression Maternal Uncle        suicide attempt by gun shot wound to head. Survived  . Alcohol abuse Other   . Drug abuse Other   . Breast cancer Maternal Grandmother   . Diabetes Maternal Grandmother   . Breast cancer Paternal Aunt   . Colon polyps Sister        x 2   Social History   Socioeconomic History  . Marital status: Married    Spouse name: Not on file  . Number of children: 0  . Years of education: Not on file  . Highest education level: Not on file  Occupational History  . Occupation: disabled  Tobacco Use  . Smoking status: Never Smoker  . Smokeless tobacco: Never Used  Substance and Sexual Activity  . Alcohol use: No    Alcohol/week: 0.0 standard drinks  . Drug use: No  . Sexual activity: Yes  Other Topics Concern  . Not on file  Social History Narrative   Pt has h.s. degree   Social Determinants of Health   Financial Resource Strain:   . Difficulty of Paying Living Expenses: Not on file  Food Insecurity:   . Worried About Charity fundraiser in the Last Year: Not  on file  . Ran Out of Food in the Last Year: Not on file  Transportation Needs:   . Lack of Transportation (Medical): Not on file  . Lack of Transportation (Non-Medical): Not on file  Physical Activity:   . Days of Exercise per Week: Not on file  . Minutes of Exercise per Session: Not on file  Stress:   . Feeling of Stress : Not on file  Social Connections:   . Frequency of Communication with Friends and Family: Not on file  . Frequency of Social Gatherings with Friends and Family: Not on file  . Attends Religious Services: Not on file  . Active Member of Clubs or Organizations: Not on file  . Attends Archivist Meetings: Not on file  .  Marital Status: Not on file   Past Surgical History:  Procedure Laterality Date  . ABDOMINAL HYSTERECTOMY  1999  . bunions removed  1988  . C4 C5 cage insertion  06/28/2013  . CHOLECYSTECTOMY  04/25/2008  . EDG  12/17/2004  . ELECTROCARDIOGRAM  05/27/2007  . L5 S1 rod insertion  11/07/2012  . SKIN CANCER EXCISION    . SKIN CANCER EXCISION     several  . TONSILLECTOMY  1981  . TUBAL LIGATION  1997   Past Medical History:  Diagnosis Date  . Anemia   . ANXIETY 06/10/2007  . Chronic headaches   . Degenerative arthritis of spine    back and neck  . Depression   . DYSLIPIDEMIA 06/28/2008  . Esophageal stricture   . Fibromyalgia   . Gallstones   . GERD 06/10/2007  . Hiatal hernia   . 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 HYPERTENSION 06/10/2007  . Irritable bowel syndrome 06/28/2008  . OSTEOARTHRITIS 06/10/2007  . Skin cancer    basal cell  . Tubular adenoma of colon    There were no vitals taken for this visit.  Opioid Risk Score:   Fall Risk Score:  `1  Depression screen PHQ 2/9  Depression screen [facility] 2/9 03/13/2019 02/07/2019 12/06/2018 09/22/2018 09/20/2018 03/28/2018 01/25/2018  Decreased Interest 1 1 1 1 1 3 3   Down, Depressed, Hopeless 1 1 1 1 1 3 2   PHQ - 2 Score 2 2 2 2 2 6 5   Altered sleeping - - - - 1 - 0  Tired, decreased energy - - - - 1 - 3  Change in appetite - - - - 1 - 1  Feeling bad or failure about yourself  - - - - 1 - 1  Trouble concentrating - - - - 1 - 2  Moving slowly or fidgety/restless - - - - 1 - 0  Suicidal thoughts - - - - 0 - 0  PHQ-9 Score - - - - 8 - 12  Difficult doing work/chores - - - - Extremely dIfficult - -  Some recent data might be hidden    Review of Systems  Constitutional: Positive for appetite change and unexpected weight change.  HENT: Negative.   Eyes: Negative.   Respiratory: Positive for apnea and cough.   Cardiovascular: Positive for leg swelling.  Gastrointestinal: Negative.   Endocrine: Negative.   Genitourinary: Positive for difficulty  urinating.  Musculoskeletal: Positive for arthralgias, back pain, gait problem and myalgias.  Skin: Negative.   Allergic/Immunologic: Negative.   Neurological: Positive for dizziness, tremors, weakness and numbness.  Hematological: Bruises/bleeds easily.  Psychiatric/Behavioral: The patient is nervous/anxious.   All other systems reviewed and are negative.  Objective:   Physical Exam Vitals and nursing note reviewed.  Constitutional:      Appearance: Normal appearance.  Cardiovascular:     Rate and Rhythm: Normal rate and regular rhythm.     Pulses: Normal pulses.     Heart sounds: Normal heart sounds.  Pulmonary:     Effort: Pulmonary effort is normal.     Breath sounds: Normal breath sounds.  Musculoskeletal:     Cervical back: Normal range of motion and neck supple.     Comments: Normal Muscle Bulk and Muscle Testing Reveals:  Upper Extremities: Full ROM and Muscle Strength 5/5  Lumbar Paraspinal Tenderness: L-4-L-5 Lower Extremities: Full ROM and Muscle Strength 5/5 Arises from chair with ease Narrow Based  Gait   Skin:    General: Skin is warm.  Neurological:     Mental Status: She is alert and oriented to person, place, and time.  Psychiatric:        Mood and Affect: Mood normal.        Behavior: Behavior normal.           Assessment & Plan:  1. History of chronic lumbar facet disease with spondylosis/DDD/ L4-5 spondylolisthesis.With L4-5 L5-S1 decompression stabilization:09/27/2019 Continue: Hydrocodone 10/325mg  one tablet every 6 hours as needed #120. We will continue the opioid monitoring program, this consists of regular clinic visits, examinations, urine drug screen, pill counts as well as use of [place] Controlled Substance Reporting System. 2. Depression with anxiety/ PTSD: Dr. Sima Matas Following. 09/27/2019.Continue Xanax,Abilifyand Effexor. 3. Cervical spondylosis: Stable at this time with no complaints. Continue Medication  Regime.09/27/2019 4. 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 Continue Ritalin 10 mg one tablet twice a day 0700 and noon #60. Dr. Naaman Plummer note was reviewed, we discuss the slow titration in January, she verbalizes understanding. 09/27/2019 5. Muscle Spasm: Continuecurrent medication regimen withFlexeril: May take 1-2 tablets at HS. 09/27/2019 6. Left Lumbar Radiculitis:Continue to Monitor.09/27/2019 7. Left Knee Osteoarthritis:No complaints today.S/PLeft Knee Injectionon 01/15/2020with good relief noted.09/27/2019 8. Right Shoulder pain: No complaints today. Continue hep as tolerated. Continue to monitor. 09/27/2019 F/U in 1 month  7minutes of face to face patient care time was spent during this visit. All questions were encouraged and answered.

## 2019-09-28 ENCOUNTER — Other Ambulatory Visit: Payer: Self-pay | Admitting: Family Medicine

## 2019-09-28 NOTE — Telephone Encounter (Signed)
Please notify patient that an office visit is overdue and required to follow up on her HTN and blood work. She was last here in 09/2018.  Please have patient schedule. I have refilled a 30 day supply. Need OV prior to any further refills.

## 2019-10-16 ENCOUNTER — Other Ambulatory Visit: Payer: Self-pay | Admitting: Family Medicine

## 2019-10-16 DIAGNOSIS — Z1231 Encounter for screening mammogram for malignant neoplasm of breast: Secondary | ICD-10-CM

## 2019-10-18 ENCOUNTER — Other Ambulatory Visit: Payer: Self-pay

## 2019-10-18 ENCOUNTER — Encounter: Payer: BC Managed Care – PPO | Attending: Physical Medicine and Rehabilitation | Admitting: Registered Nurse

## 2019-10-18 ENCOUNTER — Encounter: Payer: Self-pay | Admitting: Registered Nurse

## 2019-10-18 VITALS — BP 123/79 | Ht 61.0 in | Wt 170.0 lb

## 2019-10-18 DIAGNOSIS — F411 Generalized anxiety disorder: Secondary | ICD-10-CM | POA: Insufficient documentation

## 2019-10-18 DIAGNOSIS — Z5181 Encounter for therapeutic drug level monitoring: Secondary | ICD-10-CM

## 2019-10-18 DIAGNOSIS — G894 Chronic pain syndrome: Secondary | ICD-10-CM

## 2019-10-18 DIAGNOSIS — F331 Major depressive disorder, recurrent, moderate: Secondary | ICD-10-CM

## 2019-10-18 DIAGNOSIS — Z79891 Long term (current) use of opiate analgesic: Secondary | ICD-10-CM

## 2019-10-18 DIAGNOSIS — M461 Sacroiliitis, not elsewhere classified: Secondary | ICD-10-CM | POA: Insufficient documentation

## 2019-10-18 DIAGNOSIS — F0781 Postconcussional syndrome: Secondary | ICD-10-CM

## 2019-10-18 DIAGNOSIS — M62838 Other muscle spasm: Secondary | ICD-10-CM

## 2019-10-18 DIAGNOSIS — M47817 Spondylosis without myelopathy or radiculopathy, lumbosacral region: Secondary | ICD-10-CM | POA: Insufficient documentation

## 2019-10-18 DIAGNOSIS — M961 Postlaminectomy syndrome, not elsewhere classified: Secondary | ICD-10-CM

## 2019-10-18 DIAGNOSIS — M47816 Spondylosis without myelopathy or radiculopathy, lumbar region: Secondary | ICD-10-CM

## 2019-10-18 DIAGNOSIS — M5416 Radiculopathy, lumbar region: Secondary | ICD-10-CM

## 2019-10-18 MED ORDER — HYDROCODONE-ACETAMINOPHEN 10-325 MG PO TABS: 1.0000 | ORAL_TABLET | Freq: Four times a day (QID) | ORAL | 0 refills | Status: DC | PRN

## 2019-10-18 MED ORDER — METHYLPHENIDATE HCL 10 MG PO TABS: 10.0000 mg | ORAL_TABLET | Freq: Two times a day (BID) | ORAL | 0 refills | Status: DC

## 2019-10-18 NOTE — Progress Notes (Signed)
Subjective:    Patient ID: [name], female    DOB: [birth year], 64 y.o.   MRN: [id number]  HPI: [name] is a 64 y.o. female whose appointment was changed to a virtual office visit to reduce the risk of exposure to the COVID-19 virus and to help [name]  remain healthy and safe. The virtual visit will also provide continuity of care. [name] agrees to the virtual visit and verbalizes understanding. She states her  pain is located in her lower back radiating into her left lower extremity. She rates her pain 8. Her current exercise regime is walking and performing stretching exercises.  Ms. Marik Morphine equivalent is 40.00  MME.  Last Oral Swab was Performed on 08/02/2019, it was consistent.   Wenda Overland RN asked the Health and History Questions. This provider and Wenda Overland verified we were speaking with the correct person using two identifiers.   Pain Inventory Average Pain 8 Pain Right Now 8 My pain is constant, sharp, burning, dull, stabbing, tingling and aching  In the last 24 hours, has pain interfered with the following? General activity 10 Relation with others 10 Enjoyment of life 10 What TIME of day is your pain at its worst?all Sleep (in general) Fair  Pain is worse with: walking, sitting, inactivity, standing and some activites Pain improves with: rest, heat/ice, pacing activities, medication and injections Relief from Meds: 9  Mobility use a walker ability to climb steps?  yes do you drive?  yes  Function not employed: date last employed 11/2008 disabled: date disabled 11/2013 I need assistance with the following:  dressing, meal prep, household duties and shopping  Neuro/Psych bladder control problems weakness numbness tremor tingling trouble walking spasms dizziness anxiety  Prior Studies Any changes since last visit?  no  Physicians involved in your care Any changes since last visit?  no   Family History    Problem Relation Age of Onset  . Cervical cancer Mother   . Diabetes Mother   . Stroke Mother   . Alzheimer's disease Mother   . Heart attack Father   . Stroke Father   . Colon cancer Father   . Anxiety disorder Maternal Aunt   . Depression Maternal Aunt   . Anxiety disorder Maternal Uncle   . Depression Maternal Uncle        suicide attempt by gun shot wound to head. Survived  . Alcohol abuse Other   . Drug abuse Other   . Breast cancer Maternal Grandmother   . Diabetes Maternal Grandmother   . Breast cancer Paternal Aunt   . Colon polyps Sister        x 2   Social History   Socioeconomic History  . Marital status: Married    Spouse name: Not on file  . Number of children: 0  . Years of education: Not on file  . Highest education level: Not on file  Occupational History  . Occupation: disabled  Tobacco Use  . Smoking status: Never Smoker  . Smokeless tobacco: Never Used  Substance and Sexual Activity  . Alcohol use: No    Alcohol/week: 0.0 standard drinks  . Drug use: No  . Sexual activity: Yes  Other Topics Concern  . Not on file  Social History Narrative   Pt has h.s. degree   Social Determinants of Health   Financial Resource Strain:   . Difficulty of Paying Living Expenses: Not on file  Food Insecurity:   .  Worried About Charity fundraiser in the Last Year: Not on file  . Ran Out of Food in the Last Year: Not on file  Transportation Needs:   . Lack of Transportation (Medical): Not on file  . Lack of Transportation (Non-Medical): Not on file  Physical Activity:   . Days of Exercise per Week: Not on file  . Minutes of Exercise per Session: Not on file  Stress:   . Feeling of Stress : Not on file  Social Connections:   . Frequency of Communication with Friends and Family: Not on file  . Frequency of Social Gatherings with Friends and Family: Not on file  . Attends Religious Services: Not on file  . Active Member of Clubs or Organizations: Not on  file  . Attends Archivist Meetings: Not on file  . Marital Status: Not on file   Past Surgical History:  Procedure Laterality Date  . ABDOMINAL HYSTERECTOMY  1999  . bunions removed  1988  . C4 C5 cage insertion  06/28/2013  . CHOLECYSTECTOMY  04/25/2008  . EDG  12/17/2004  . ELECTROCARDIOGRAM  05/27/2007  . L5 S1 rod insertion  11/07/2012  . SKIN CANCER EXCISION    . SKIN CANCER EXCISION     several  . TONSILLECTOMY  1981  . TUBAL LIGATION  1997   Past Medical History:  Diagnosis Date  . Anemia   . ANXIETY 06/10/2007  . Chronic headaches   . Degenerative arthritis of spine    back and neck  . Depression   . DYSLIPIDEMIA 06/28/2008  . Esophageal stricture   . Fibromyalgia   . Gallstones   . GERD 06/10/2007  . Hiatal hernia   . 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 HYPERTENSION 06/10/2007  . Irritable bowel syndrome 06/28/2008  . OSTEOARTHRITIS 06/10/2007  . Skin cancer    basal cell  . Tubular adenoma of colon    BP 123/79 Comment: reported  Ht 5\' 1"  (1.549 m) Comment: reported  Wt 170 lb (77.1 kg) Comment: reported  BMI 32.12 kg/m   Opioid Risk Score:   Fall Risk Score:  `1  Depression screen PHQ 2/9  Depression screen [facility] 2/9 10/18/2019 03/13/2019 02/07/2019 12/06/2018 09/22/2018 09/20/2018 03/28/2018  Decreased Interest 1 1 1 1 1 1 3   Down, Depressed, Hopeless 1 1 1 1 1 1 3   PHQ - 2 Score 2 2 2 2 2 2 6   Altered sleeping - - - - - 1 -  Tired, decreased energy - - - - - 1 -  Change in appetite - - - - - 1 -  Feeling bad or failure about yourself  - - - - - 1 -  Trouble concentrating - - - - - 1 -  Moving slowly or fidgety/restless - - - - - 1 -  Suicidal thoughts - - - - - 0 -  PHQ-9 Score - - - - - 8 -  Difficult doing work/chores - - - - - Extremely dIfficult -  Some recent data might be hidden    Review of Systems  Constitutional: Negative.   HENT: Negative.   Eyes: Negative.   Respiratory: Negative.   Cardiovascular: Negative.   Gastrointestinal: Negative.    Endocrine: Negative.   Genitourinary:       Bladder control  Musculoskeletal: Positive for gait problem.       Spasms  Skin: Negative.   Allergic/Immunologic: Negative.   Neurological: Positive for dizziness, tremors, weakness and numbness.  Tingling  Hematological: Negative.   Psychiatric/Behavioral: The patient is nervous/anxious.   All other systems reviewed and are negative.      Objective:   Physical Exam Vitals and nursing note reviewed.  Musculoskeletal:     Comments: No Physical Exam: Virtual Visit           Assessment & Plan:  1. History of chronic lumbar facet disease with spondylosis/DDD/ L4-5 spondylolisthesis.With L4-5 L5-S1 decompression stabilization:10/18/2019 Refilled: Hydrocodone 10/325mg  one tablet every 6 hours as needed #120. We will continue the opioid monitoring program, this consists of regular clinic visits, examinations, urine drug screen, pill counts as well as use of [place] Controlled Substance Reporting System. 2. Depression with anxiety/ PTSD: Dr. Sima Matas Following.Continue Xanax,Abilifyand Effexor. 10/18/2019 3. Cervical spondylosis: Stable at this time with no complaints. Continue Medication Regime.10/18/2019 4. 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 Continue Ritalin 10 mg one tablet twice a day 0700 and noon #60. We will Begin with slow Titration: She verbalizes understanding.  Dr. Naaman Plummer note was reviewed, we discuss the slow titration in January, she verbalizes understanding.10/18/2019 5. Muscle Spasm: Continuecurrent medication regimen withFlexeril: May take 1-2 tablets at HS.10/18/2019 6. Left Lumbar Radiculitis:Continue to Monitor.01/66/2021 7. Left Knee Osteoarthritis:No complaints today.S/PLeft Knee Injectionon 01/15/2020with good relief noted.10/18/2019 8. Right Shoulder pain:No complaints today.Continue hep as tolerated. Continue to  monitor.10/18/2019  F/U in 1 month  Virtual Visit Established Patient Location of Patient: In Her Home Location of Provider: In Office Time Spent: 15 Minutes

## 2019-10-20 DIAGNOSIS — H2513 Age-related nuclear cataract, bilateral: Secondary | ICD-10-CM | POA: Diagnosis not present

## 2019-10-20 DIAGNOSIS — H43393 Other vitreous opacities, bilateral: Secondary | ICD-10-CM | POA: Diagnosis not present

## 2019-10-20 DIAGNOSIS — H35373 Puckering of macula, bilateral: Secondary | ICD-10-CM | POA: Diagnosis not present

## 2019-10-20 DIAGNOSIS — H25013 Cortical age-related cataract, bilateral: Secondary | ICD-10-CM | POA: Diagnosis not present

## 2019-10-21 ENCOUNTER — Other Ambulatory Visit: Payer: Self-pay | Admitting: Family Medicine

## 2019-10-23 ENCOUNTER — Encounter: Payer: Self-pay | Admitting: Family Medicine

## 2019-10-23 ENCOUNTER — Other Ambulatory Visit: Payer: Self-pay

## 2019-10-23 DIAGNOSIS — H35039 Hypertensive retinopathy, unspecified eye: Secondary | ICD-10-CM

## 2019-10-24 ENCOUNTER — Encounter: Payer: Self-pay | Admitting: Family Medicine

## 2019-10-24 ENCOUNTER — Ambulatory Visit (INDEPENDENT_AMBULATORY_CARE_PROVIDER_SITE_OTHER): Payer: BC Managed Care – PPO | Admitting: Family Medicine

## 2019-10-24 VITALS — BP 120/84 | HR 101 | Temp 97.6°F | Ht 61.0 in | Wt 167.0 lb

## 2019-10-24 DIAGNOSIS — E039 Hypothyroidism, unspecified: Secondary | ICD-10-CM | POA: Diagnosis not present

## 2019-10-24 DIAGNOSIS — G894 Chronic pain syndrome: Secondary | ICD-10-CM

## 2019-10-24 DIAGNOSIS — H35039 Hypertensive retinopathy, unspecified eye: Secondary | ICD-10-CM

## 2019-10-24 DIAGNOSIS — M797 Fibromyalgia: Secondary | ICD-10-CM | POA: Diagnosis not present

## 2019-10-24 DIAGNOSIS — Z780 Asymptomatic menopausal state: Secondary | ICD-10-CM

## 2019-10-24 DIAGNOSIS — I10 Essential (primary) hypertension: Secondary | ICD-10-CM

## 2019-10-24 DIAGNOSIS — E782 Mixed hyperlipidemia: Secondary | ICD-10-CM

## 2019-10-24 DIAGNOSIS — D126 Benign neoplasm of colon, unspecified: Secondary | ICD-10-CM

## 2019-10-24 DIAGNOSIS — F1190 Opioid use, unspecified, uncomplicated: Secondary | ICD-10-CM

## 2019-10-24 DIAGNOSIS — M8580 Other specified disorders of bone density and structure, unspecified site: Secondary | ICD-10-CM

## 2019-10-24 MED ORDER — IRBESARTAN 75 MG PO TABS: 75.0000 mg | ORAL_TABLET | Freq: Every day | ORAL | 3 refills | Status: DC

## 2019-10-24 NOTE — Progress Notes (Signed)
Subjective  CC:  Chief Complaint  Patient presents with  . Hypertension  . Hyperlipidemia  . Hypothyroidism  . Fibromyalgia    HPI: [name] is a 64 y.o. female who presents to the office today to address the problems listed above in the chief complaint. Last visit 09/2018.  Hypertension f/u: Control is good . Pt reports she is doing well. taking medications as instructed, no medication side effects noted, no TIAs, no chest pain on exertion, no dyspnea on exertion, no swelling of ankles. On 1/2 of a combo tablet daily. No clear reason. No problems with LE edema.  She denies adverse effects from his BP medications. Compliance with medication is good.   Fibro/chronic pain/chronic opioids: Pain is well controlled.  She is trying to wean down off her narcotics.  I review her notes monthly.  Hyperlipidemia: No longer on a statin.  Stopped for unclear reasons in the past.  Last cardiovascular risk score was borderline.  Nonfasting today for recheck.  Would restart statin.  Hypothyroidism on daily supplements without signs or symptoms of low or high thyroid.  Due for recheck.  Osteopenia last checked several years ago.  Postmenopausal.  Takes vitamin D and calcium.  No fractures.  Was previously checked by GYN but is interested in her care here with me in the future.  Also due for mammogram which has been scheduled  History of tubular adenoma: Status post colonoscopy last year.  Partially inadequate prep but worrisome findings present.  No polyps identified.  Recheck in 5 years.  Assessment  1. Essential hypertension   2. Fibromyalgia   3. Mixed hyperlipidemia   4. Hypertensive retinopathy, unspecified laterality   5. Acquired hypothyroidism   6. Osteopenia after menopause   7. Chronic pain syndrome   8. Chronic narcotic use   9. Tubular adenoma of colon      Plan    Hypertension f/u: BP control is well controlled.  Will change to Avalide 75 mg daily.  The 12.5 mg of HCTZ  component.  You to take that having to cut the pills in half.  Patient will monitor blood pressure at home and follow-up if elevated.  Recheck 6 months.  Check renal electrolytes today.  Hyperlipidemia f/u: Check nonfasting lipids today.  Start statin if indicated.  Patient agrees  Recheck hypothyroidism with blood work today.  Patient is clinically euthyroid.  Chronic pain syndrome on chronic narcotics.  Agree with trying to wean medicines as tolerated.  No new symptoms.  No red flags  Health maintenance: Patient to get her mammogram.  Will check bone density in the next 2 to 3 years.  Continue calcium and vitamin D supplementation.  Return for complete physical in 6 months.  Colon cancer screening is up-to-date.  Immunizations are up-to-date Education regarding management of these chronic disease states was given. Management strategies discussed on successive visits include dietary and exercise recommendations, goals of achieving and maintaining IBW, and lifestyle modifications aiming for adequate sleep and minimizing stressors.   Follow up: Return in about 6 months (around 04/22/2020) for complete physical.  Orders Placed This Encounter  Procedures  . CMP  . CBC w/Diff  . Lipids  . TSH   Meds ordered this encounter  Medications  . irbesartan (AVAPRO) 75 MG tablet    Sig: Take 1 tablet (75 mg total) by mouth daily.    Dispense:  90 tablet    Refill:  3      BP Readings from Last  3 Encounters:  10/24/19 120/84  10/18/19 123/79  09/27/19 128/82   Wt Readings from Last 3 Encounters:  10/24/19 167 lb (75.8 kg)  10/18/19 170 lb (77.1 kg)  09/27/19 170 lb (77.1 kg)    Lab Results  Component Value Date   CHOL 280 (H) 09/20/2018   CHOL 182 08/10/2017   CHOL 166 08/04/2016   Lab Results  Component Value Date   HDL 46.80 09/20/2018   HDL 45.70 08/10/2017   HDL 51.70 08/04/2016   Lab Results  Component Value Date   LDLCALC 78 08/04/2016   LDLCALC 96 08/15/2009   Lab  Results  Component Value Date   TRIG (H) 09/20/2018    401.0 Triglyceride is over 400; calculations on Lipids are invalid.   TRIG 354.0 (H) 08/10/2017   TRIG 182.0 (H) 08/04/2016   Lab Results  Component Value Date   CHOLHDL 6 09/20/2018   CHOLHDL 4 08/10/2017   CHOLHDL 3 08/04/2016   Lab Results  Component Value Date   LDLDIRECT 161.0 09/20/2018   LDLDIRECT 82.0 08/10/2017   LDLDIRECT 84.0 06/25/2015   Lab Results  Component Value Date   CREATININE 0.76 09/20/2018   BUN 11 09/20/2018   NA 144 09/20/2018   K 4.0 09/20/2018   CL 103 09/20/2018   CO2 33 (H) 09/20/2018    The 10-year ASCVD risk score Mikey Bussing DC Jr., et al., 2013) is: 6.6%   Values used to calculate the score:     Age: 57 years     Sex: Female     Is Non-Hispanic African American: No     Diabetic: No     Tobacco smoker: No     Systolic Blood Pressure: 123456 mmHg     Is BP treated: Yes     HDL Cholesterol: 46.8 mg/dL     Total Cholesterol: 280 mg/dL  I reviewed the patients updated PMH, FH, and SocHx.    Patient Active Problem List   Diagnosis Date Noted  . Chronic narcotic use 09/20/2018    Priority: High  . Tubular adenoma of colon     Priority: High  . Fibromyalgia     Priority: High  . Chronic pain syndrome 04/27/2018    Priority: High  . MDD (major depressive disorder) 03/13/2014    Priority: High  . GAD (generalized anxiety disorder) 03/13/2014    Priority: High  . Hypothyroidism 08/05/2011    Priority: High  . Hyperlipidemia 06/28/2008    Priority: High  . Essential hypertension 06/10/2007    Priority: High  . Hypertensive retinopathy 10/23/2019    Priority: Medium  . Primary osteoarthritis of right knee 03/31/2016    Priority: Medium  . Osteopenia after menopause 08/17/2014    Priority: Medium  . Panic disorder without agoraphobia 07/18/2014    Priority: Medium  . PTSD (post-traumatic stress disorder) 03/13/2014    Priority: Medium  . Cervical spondylosis without myelopathy  12/02/2012    Priority: Medium  . Lumbosacral spondylosis without myelopathy 06/07/2012    Priority: Medium  . Irritable bowel syndrome 06/28/2008    Priority: Medium  . GERD 06/10/2007    Priority: Medium  . Lumbar facet arthropathy 02/05/2012    Priority: Low    Allergies: Gabapentin, Poison ivy extract [poison ivy extract], Poison oak extract [poison oak extract], and Poison sumac extract  Social History: Patient  reports that she has never smoked. She has never used smokeless tobacco. She reports that she does not drink alcohol or use drugs.  Current Meds  Medication Sig  . acyclovir (ZOVIRAX) 800 MG tablet Take 1 tablet (800 mg total) by mouth daily.  Marland Kitchen ALPRAZolam (XANAX) 1 MG tablet TAKE 1/2 TAB BY MOUTH 3 TIMES A DAY AS NEEDED FOR ANXIETY  . ARIPiprazole (ABILIFY) 2 MG tablet TAKE 1 TABLET BY MOUTH EVERY DAY  . cyclobenzaprine (FLEXERIL) 5 MG tablet Take 1 tablet (5 mg total) by mouth at bedtime as needed for muscle spasms. Three month supply  . HYDROcodone-acetaminophen (NORCO) 10-325 MG tablet Take 1 tablet by mouth every 6 (six) hours as needed.  . meclizine (ANTIVERT) 12.5 MG tablet TAKE 1 TABLET (12.5 MG TOTAL) BY MOUTH 3 (THREE) TIMES DAILY AS NEEDED.  Marland Kitchen methylphenidate (RITALIN) 10 MG tablet Take 1 tablet (10 mg total) by mouth 2 (two) times daily with breakfast and lunch. [id number]  . omeprazole (PRILOSEC) 40 MG capsule Take 40 mg by mouth 2 (two) times daily.  Marland Kitchen venlafaxine XR (EFFEXOR-XR) 150 MG 24 hr capsule TAKE 1 CAPSULE DAILY WITH BREAKFAST  . [DISCONTINUED] irbesartan-hydrochlorothiazide (AVALIDE) 150-12.5 MG tablet TAKE 1/2 TABLET BY MOUTH EVERY DAY    Review of Systems: Cardiovascular: negative for chest pain, palpitations, leg swelling, orthopnea Respiratory: negative for SOB, wheezing or persistent cough Gastrointestinal: negative for abdominal pain Genitourinary: negative for dysuria or gross hematuria  Objective  Vitals: BP 120/84 (BP Location: Right  Arm, Patient Position: Sitting, Cuff Size: Normal)   Pulse (!) 101   Temp 97.6 F (36.4 C) (Temporal)   Ht 5\' 1"  (1.549 m)   Wt 167 lb (75.8 kg)   SpO2 99%   BMI 31.55 kg/m  General: no acute distress  Psych:  Alert and oriented, normal mood and affect HEENT:  Normocephalic, atraumatic, supple neck  Cardiovascular:  RRR without murmur. no edema Respiratory:  Good breath sounds bilaterally, CTAB with normal respiratory effort Skin:  Warm, no rashes Neurologic:   Mental status is normal  Commons side effects, risks, benefits, and alternatives for medications and treatment plan prescribed today were discussed, and the patient expressed understanding of the given instructions. Patient is instructed to call or message via MyChart if he/she has any questions or concerns regarding our treatment plan. No barriers to understanding were identified. We discussed Red Flag symptoms and signs in detail. Patient expressed understanding regarding what to do in case of urgent or emergency type symptoms.   Medication list was reconciled, printed and provided to the patient in AVS. Patient instructions and summary information was reviewed with the patient as documented in the AVS. This note was prepared with assistance of Dragon voice recognition software. Occasional wrong-word or sound-a-like substitutions may have occurred due to the inherent limitations of voice recognition software  This visit occurred during the SARS-CoV-2 public health emergency.  Safety protocols were in place, including screening questions prior to the visit, additional usage of staff PPE, and extensive cleaning of exam room while observing appropriate contact time as indicated for disinfecting solutions.

## 2019-10-24 NOTE — Patient Instructions (Signed)
Please return in 6 months for your annual complete physical; please come fasting.  I have changed your blood pressure pill to avelide 75 mg one pill daily. Let me know if your blood pressure getstoo high on this. We want in < 135/85 consistently. 120s/70s is ideal.   I will release your lab results to you on your MyChart account with further instructions. Please reply with any questions.   If you have any questions or concerns, please don't hesitate to send me a message via MyChart or call the office at [phone]. Thank you for visiting with [name] today! It's our pleasure caring for you.   Hypertension, Adult High blood pressure (hypertension) is when the force of blood pumping through the arteries is too strong. The arteries are the blood vessels that carry blood from the heart throughout the body. Hypertension forces the heart to work harder to pump blood and may cause arteries to become narrow or stiff. Untreated or uncontrolled hypertension can cause a heart attack, heart failure, a stroke, kidney disease, and other problems. A blood pressure reading consists of a higher number over a lower number. Ideally, your blood pressure should be below 120/80. The first ("top") number is called the systolic pressure. It is a measure of the pressure in your arteries as your heart beats. The second ("bottom") number is called the diastolic pressure. It is a measure of the pressure in your arteries as the heart relaxes. What are the causes? The exact cause of this condition is not known. There are some conditions that result in or are related to high blood pressure. What increases the risk? Some risk factors for high blood pressure are under your control. The following factors may make you more likely to develop this condition:  Smoking.  Having type 2 diabetes mellitus, high cholesterol, or both.  Not getting enough exercise or physical activity.  Being overweight.  Having too much fat, sugar,  calories, or salt (sodium) in your diet.  Drinking too much alcohol. Some risk factors for high blood pressure may be difficult or impossible to change. Some of these factors include:  Having chronic kidney disease.  Having a family history of high blood pressure.  Age. Risk increases with age.  Race. You may be at higher risk if you are African American.  Gender. Men are at higher risk than women before age 25. After age 11, women are at higher risk than men.  Having obstructive sleep apnea.  Stress. What are the signs or symptoms? High blood pressure may not cause symptoms. Very high blood pressure (hypertensive crisis) may cause:  Headache.  Anxiety.  Shortness of breath.  Nosebleed.  Nausea and vomiting.  Vision changes.  Severe chest pain.  Seizures. How is this diagnosed? This condition is diagnosed by measuring your blood pressure while you are seated, with your arm resting on a flat surface, your legs uncrossed, and your feet flat on the floor. The cuff of the blood pressure monitor will be placed directly against the skin of your upper arm at the level of your heart. It should be measured at least twice using the same arm. Certain conditions can cause a difference in blood pressure between your right and left arms. Certain factors can cause blood pressure readings to be lower or higher than normal for a short period of time:  When your blood pressure is higher when you are in a health care provider's office than when you are at home, this is called white  coat hypertension. Most people with this condition do not need medicines.  When your blood pressure is higher at home than when you are in a health care provider's office, this is called masked hypertension. Most people with this condition may need medicines to control blood pressure. If you have a high blood pressure reading during one visit or you have normal blood pressure with other risk factors, you may be  asked to:  Return on a different day to have your blood pressure checked again.  Monitor your blood pressure at home for 1 week or longer. If you are diagnosed with hypertension, you may have other blood or imaging tests to help your health care provider understand your overall risk for other conditions. How is this treated? This condition is treated by making healthy lifestyle changes, such as eating healthy foods, exercising more, and reducing your alcohol intake. Your health care provider may prescribe medicine if lifestyle changes are not enough to get your blood pressure under control, and if:  Your systolic blood pressure is above 130.  Your diastolic blood pressure is above 80. Your personal target blood pressure may vary depending on your medical conditions, your age, and other factors. Follow these instructions at home: Eating and drinking   Eat a diet that is high in fiber and potassium, and low in sodium, added sugar, and fat. An example eating plan is called the DASH (Dietary Approaches to Stop Hypertension) diet. To eat this way: ? Eat plenty of fresh fruits and vegetables. Try to fill one half of your plate at each meal with fruits and vegetables. ? Eat whole grains, such as whole-wheat pasta, brown rice, or whole-grain bread. Fill about one fourth of your plate with whole grains. ? Eat or drink low-fat dairy products, such as skim milk or low-fat yogurt. ? Avoid fatty cuts of meat, processed or cured meats, and poultry with skin. Fill about one fourth of your plate with lean proteins, such as fish, chicken without skin, beans, eggs, or tofu. ? Avoid pre-made and processed foods. These tend to be higher in sodium, added sugar, and fat.  Reduce your daily sodium intake. Most people with hypertension should eat less than 1,500 mg of sodium a day.  Do not drink alcohol if: ? Your health care provider tells you not to drink. ? You are pregnant, may be pregnant, or are planning  to become pregnant.  If you drink alcohol: ? Limit how much you use to:  0-1 drink a day for women.  0-2 drinks a day for men. ? Be aware of how much alcohol is in your drink. In the [place] one drink equals one 12 oz bottle of beer (355 mL), one 5 oz glass of wine (148 mL), or one 1 oz glass of hard liquor (44 mL). Lifestyle   Work with your health care provider to maintain a healthy body weight or to lose weight. Ask what an ideal weight is for you.  Get at least 30 minutes of exercise most days of the week. Activities may include walking, swimming, or biking.  Include exercise to strengthen your muscles (resistance exercise), such as Pilates or lifting weights, as part of your weekly exercise routine. Try to do these types of exercises for 30 minutes at least 3 days a week.  Do not use any products that contain nicotine or tobacco, such as cigarettes, e-cigarettes, and chewing tobacco. If you need help quitting, ask your health care provider.  Monitor your blood pressure  at home as told by your health care provider.  Keep all follow-up visits as told by your health care provider. This is important. Medicines  Take over-the-counter and prescription medicines only as told by your health care provider. Follow directions carefully. Blood pressure medicines must be taken as prescribed.  Do not skip doses of blood pressure medicine. Doing this puts you at risk for problems and can make the medicine less effective.  Ask your health care provider about side effects or reactions to medicines that you should watch for. Contact a health care provider if you:  Think you are having a reaction to a medicine you are taking.  Have headaches that keep coming back (recurring).  Feel dizzy.  Have swelling in your ankles.  Have trouble with your vision. Get help right away if you:  Develop a severe headache or confusion.  Have unusual weakness or numbness.  Feel faint.  Have severe  pain in your chest or abdomen.  Vomit repeatedly.  Have trouble breathing. Summary  Hypertension is when the force of blood pumping through your arteries is too strong. If this condition is not controlled, it may put you at risk for serious complications.  Your personal target blood pressure may vary depending on your medical conditions, your age, and other factors. For most people, a normal blood pressure is less than 120/80.  Hypertension is treated with lifestyle changes, medicines, or a combination of both. Lifestyle changes include losing weight, eating a healthy, low-sodium diet, exercising more, and limiting alcohol. This information is not intended to replace advice given to you by your health care provider. Make sure you discuss any questions you have with your health care provider. Document Revised: 06/08/2018 Document Reviewed: 06/08/2018 Elsevier Patient Education  2020 [employer].

## 2019-10-25 LAB — LIPID PANEL
Cholesterol: 252 mg/dL — ABNORMAL HIGH (ref 0–200)
HDL: 46.3 mg/dL (ref >39.00)
NonHDL: 205.76
Total CHOL/HDL Ratio: 5
Triglycerides: 294 mg/dL — ABNORMAL HIGH (ref 0.0–149.0)
VLDL: 58.8 mg/dL — ABNORMAL HIGH (ref 0.0–40.0)

## 2019-10-25 LAB — COMPREHENSIVE METABOLIC PANEL
ALT: 26 U/L (ref 0–35)
AST: 23 U/L (ref 0–37)
Albumin: 4.5 g/dL (ref 3.5–5.2)
Alkaline Phosphatase: 75 U/L (ref 39–117)
BUN: 15 mg/dL (ref 6–23)
CO2: 28 mEq/L (ref 19–32)
Calcium: 9.4 mg/dL (ref 8.4–10.5)
Chloride: 99 mEq/L (ref 96–112)
Creatinine, Ser: 0.92 mg/dL (ref 0.40–1.20)
GFR: 61.76 mL/min (ref >60.00)
Glucose, Bld: 98 mg/dL (ref 70–99)
Potassium: 4.1 mEq/L (ref 3.5–5.1)
Sodium: 136 mEq/L (ref 135–145)
Total Bilirubin: 0.4 mg/dL (ref 0.2–1.2)
Total Protein: 6.7 g/dL (ref 6.0–8.3)

## 2019-10-25 LAB — CBC WITH DIFFERENTIAL/PLATELET
Basophils Absolute: 0 10*3/uL (ref 0.0–0.1)
Basophils Relative: 0.7 % (ref 0.0–3.0)
Eosinophils Absolute: 0.1 10*3/uL (ref 0.0–0.7)
Eosinophils Relative: 1.1 % (ref 0.0–5.0)
HCT: 41.7 % (ref 36.0–46.0)
Hemoglobin: 14 g/dL (ref 12.0–15.0)
Lymphocytes Relative: 36.7 % (ref 12.0–46.0)
Lymphs Abs: 1.8 10*3/uL (ref 0.7–4.0)
MCHC: 33.6 g/dL (ref 30.0–36.0)
MCV: 82.2 fl (ref 78.0–100.0)
Monocytes Absolute: 0.3 10*3/uL (ref 0.1–1.0)
Monocytes Relative: 7 % (ref 3.0–12.0)
Neutro Abs: 2.7 10*3/uL (ref 1.4–7.7)
Neutrophils Relative %: 54.5 % (ref 43.0–77.0)
Platelets: 225 10*3/uL (ref 150.0–400.0)
RBC: 5.07 Mil/uL (ref 3.87–5.11)
RDW: 15.3 % (ref 11.5–15.5)
WBC: 4.9 10*3/uL (ref 4.0–10.5)

## 2019-10-25 LAB — LDL CHOLESTEROL, DIRECT: Direct LDL: 164 mg/dL

## 2019-10-25 LAB — TSH: TSH: 3.51 u[IU]/mL (ref 0.35–4.50)

## 2019-10-30 ENCOUNTER — Telehealth: Payer: Self-pay | Admitting: *Deleted

## 2019-10-30 NOTE — Telephone Encounter (Signed)
Prior authorization submitted via Covermymeds for hydrocodone-acetaminophen 10-325mg .  Approved  Effective from 10/26/2019 through 11/24/2019.

## 2019-10-31 DIAGNOSIS — H04123 Dry eye syndrome of bilateral lacrimal glands: Secondary | ICD-10-CM | POA: Diagnosis not present

## 2019-10-31 DIAGNOSIS — H2512 Age-related nuclear cataract, left eye: Secondary | ICD-10-CM | POA: Diagnosis not present

## 2019-10-31 DIAGNOSIS — H25013 Cortical age-related cataract, bilateral: Secondary | ICD-10-CM | POA: Diagnosis not present

## 2019-10-31 DIAGNOSIS — H2513 Age-related nuclear cataract, bilateral: Secondary | ICD-10-CM | POA: Diagnosis not present

## 2019-11-21 ENCOUNTER — Other Ambulatory Visit: Payer: Self-pay

## 2019-11-21 ENCOUNTER — Encounter: Payer: BC Managed Care – PPO | Attending: Physical Medicine and Rehabilitation | Admitting: Registered Nurse

## 2019-11-21 ENCOUNTER — Encounter: Payer: Self-pay | Admitting: Registered Nurse

## 2019-11-21 VITALS — BP 155/94 | HR 89 | Temp 97.5°F | Ht 61.0 in | Wt 171.0 lb

## 2019-11-21 DIAGNOSIS — M47816 Spondylosis without myelopathy or radiculopathy, lumbar region: Secondary | ICD-10-CM | POA: Diagnosis not present

## 2019-11-21 DIAGNOSIS — M47817 Spondylosis without myelopathy or radiculopathy, lumbosacral region: Secondary | ICD-10-CM | POA: Insufficient documentation

## 2019-11-21 DIAGNOSIS — M961 Postlaminectomy syndrome, not elsewhere classified: Secondary | ICD-10-CM

## 2019-11-21 DIAGNOSIS — F0781 Postconcussional syndrome: Secondary | ICD-10-CM | POA: Insufficient documentation

## 2019-11-21 DIAGNOSIS — M5416 Radiculopathy, lumbar region: Secondary | ICD-10-CM | POA: Diagnosis not present

## 2019-11-21 DIAGNOSIS — F411 Generalized anxiety disorder: Secondary | ICD-10-CM | POA: Diagnosis not present

## 2019-11-21 DIAGNOSIS — M778 Other enthesopathies, not elsewhere classified: Secondary | ICD-10-CM

## 2019-11-21 DIAGNOSIS — G894 Chronic pain syndrome: Secondary | ICD-10-CM

## 2019-11-21 DIAGNOSIS — M62838 Other muscle spasm: Secondary | ICD-10-CM

## 2019-11-21 DIAGNOSIS — M461 Sacroiliitis, not elsewhere classified: Secondary | ICD-10-CM | POA: Insufficient documentation

## 2019-11-21 DIAGNOSIS — Z79891 Long term (current) use of opiate analgesic: Secondary | ICD-10-CM

## 2019-11-21 DIAGNOSIS — Z5181 Encounter for therapeutic drug level monitoring: Secondary | ICD-10-CM

## 2019-11-21 MED ORDER — METHYLPHENIDATE HCL 10 MG PO TABS: 10.0000 mg | ORAL_TABLET | Freq: Two times a day (BID) | ORAL | 0 refills | Status: DC

## 2019-11-21 MED ORDER — HYDROCODONE-ACETAMINOPHEN 10-325 MG PO TABS: 1.0000 | ORAL_TABLET | Freq: Four times a day (QID) | ORAL | 0 refills | Status: DC | PRN

## 2019-11-21 MED ORDER — ALPRAZOLAM 1 MG PO TABS: ORAL_TABLET | ORAL | 2 refills | Status: DC

## 2019-11-21 NOTE — Progress Notes (Signed)
Subjective:    Patient ID: [name], female    DOB: 02-24-1956, 64 y.o.   MRN: [id number]  HPI: [name] is a 64 y.o. female who returns for follow up appointment for chronic pain and medication refill. She states her pain is located in her right shoulder and lower back pain radiating into her left lower extremity. She rates her pain 8. Her  current exercise regime is walking.  Last month [name] had begun the slow weaning of Ritalin, she was given instructions. [name] tried to weaned herself off the Ritalin faster than instructed and began to go through withdrawal, she resumed the Ritalin as prescribed. We will continue her ritalin as prescribed and will began the weaning again in March, she verbalizes understanding.   [name] Morphine equivalent is 40.00 MME. She is also prescribed Alprazolam. We have discussed the black box warning of using opioids and benzodiazepines. I highlighted the dangers of using these drugs together and discussed the adverse events including respiratory suppression, overdose, cognitive impairment and importance of compliance with current regimen. We will continue to monitor and adjust as indicated.    Last Oral Swab was Performed on 08/02/2019, it was consistent.   [name] scheduled for cataract surgery on 12/01/2019, she reports.   Pain Inventory Average Pain 8 Pain Right Now 8 My pain is sharp, burning, dull, stabbing, tingling and aching  In the last 24 hours, has pain interfered with the following? General activity 10 Relation with others 10 Enjoyment of life 10 What TIME of day is your pain at its worst? . Sleep (in general) Fair  Pain is worse with: walking, sitting, inactivity, standing and some activites Pain improves with: rest, heat/ice, pacing activities, medication and injections Relief from Meds: 9  Mobility use a cane ability to climb steps?  yes do you drive?  yes  Function disabled: date disabled  2015 I need assistance with the following:  dressing, meal prep, household duties and shopping  Neuro/Psych bladder control problems weakness numbness tremor tingling trouble walking spasms dizziness depression anxiety  Prior Studies Any changes since last visit?  no  Physicians involved in your care Any changes since last visit?  no   Family History  Problem Relation Age of Onset  . Cervical cancer Mother   . Diabetes Mother   . Stroke Mother   . Alzheimer's disease Mother   . Heart attack Father   . Stroke Father   . Colon cancer Father   . Anxiety disorder Maternal Aunt   . Depression Maternal Aunt   . Anxiety disorder Maternal Uncle   . Depression Maternal Uncle        suicide attempt by gun shot wound to head. Survived  . Alcohol abuse Other   . Drug abuse Other   . Breast cancer Maternal Grandmother   . Diabetes Maternal Grandmother   . Breast cancer Paternal Aunt   . Colon polyps Sister        x 2   Social History   Socioeconomic History  . Marital status: Married    Spouse name: Not on file  . Number of children: 0  . Years of education: Not on file  . Highest education level: Not on file  Occupational History  . Occupation: disabled  Tobacco Use  . Smoking status: Never Smoker  . Smokeless tobacco: Never Used  Substance and Sexual Activity  . Alcohol use: No    Alcohol/week: 0.0 standard drinks  .  Drug use: No  . Sexual activity: Yes  Other Topics Concern  . Not on file  Social History Narrative   Pt has h.s. degree   Social Determinants of Health   Financial Resource Strain:   . Difficulty of Paying Living Expenses: Not on file  Food Insecurity:   . Worried About Charity fundraiser in the Last Year: Not on file  . Ran Out of Food in the Last Year: Not on file  Transportation Needs:   . Lack of Transportation (Medical): Not on file  . Lack of Transportation (Non-Medical): Not on file  Physical Activity:   . Days of Exercise per  Week: Not on file  . Minutes of Exercise per Session: Not on file  Stress:   . Feeling of Stress : Not on file  Social Connections:   . Frequency of Communication with Friends and Family: Not on file  . Frequency of Social Gatherings with Friends and Family: Not on file  . Attends Religious Services: Not on file  . Active Member of Clubs or Organizations: Not on file  . Attends Archivist Meetings: Not on file  . Marital Status: Not on file   Past Surgical History:  Procedure Laterality Date  . ABDOMINAL HYSTERECTOMY  1999  . bunions removed  1988  . C4 C5 cage insertion  06/28/2013  . CHOLECYSTECTOMY  04/25/2008  . EDG  12/17/2004  . ELECTROCARDIOGRAM  05/27/2007  . L5 S1 rod insertion  11/07/2012  . SKIN CANCER EXCISION    . SKIN CANCER EXCISION     several  . TONSILLECTOMY  1981  . TUBAL LIGATION  1997   Past Medical History:  Diagnosis Date  . Anemia   . ANXIETY 06/10/2007  . Cataract   . Chronic headaches   . Degenerative arthritis of spine    back and neck  . Depression   . DYSLIPIDEMIA 06/28/2008  . Esophageal stricture   . Fibromyalgia   . Gallstones   . GERD 06/10/2007  . Hiatal hernia   . 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 HYPERTENSION 06/10/2007  . Irritable bowel syndrome 06/28/2008  . OSTEOARTHRITIS 06/10/2007  . Skin cancer    basal cell  . Tubular adenoma of colon    BP (!) 155/94   Pulse 89   Temp (!) 97.5 F (36.4 C)   Ht 5\' 1"  (1.549 m)   Wt 171 lb (77.6 kg)   SpO2 96%   BMI 32.31 kg/m   Opioid Risk Score:   Fall Risk Score:  `1  Depression screen PHQ 2/9  Depression screen [facility] 2/9 10/24/2019 10/18/2019 03/13/2019 02/07/2019 12/06/2018 09/22/2018 09/20/2018  Decreased Interest 1 1 1 1 1 1 1   Down, Depressed, Hopeless 1 1 1 1 1 1 1   PHQ - 2 Score 2 2 2 2 2 2 2   Altered sleeping 0 - - - - - 1  Tired, decreased energy 3 - - - - - 1  Change in appetite 3 - - - - - 1  Feeling bad or failure about yourself  0 - - - - - 1  Trouble concentrating 0 - - - - - 1   Moving slowly or fidgety/restless 0 - - - - - 1  Suicidal thoughts 0 - - - - - 0  PHQ-9 Score 8 - - - - - 8  Difficult doing work/chores - - - - - - Extremely dIfficult  Some recent data might be hidden  Review of Systems  Constitutional: Positive for unexpected weight change.  HENT: Negative.   Eyes: Negative.   Respiratory: Negative.   Cardiovascular: Negative.   Gastrointestinal: Negative.   Endocrine: Negative.   Genitourinary: Positive for difficulty urinating.  Musculoskeletal: Positive for arthralgias, back pain, gait problem and myalgias.  Skin: Negative.   Allergic/Immunologic: Negative.   Neurological: Positive for dizziness, tremors, weakness and numbness.  Hematological: Negative.   Psychiatric/Behavioral: Positive for dysphoric mood. The patient is nervous/anxious.   All other systems reviewed and are negative.      Objective:   Physical Exam Vitals and nursing note reviewed.  Constitutional:      Appearance: Normal appearance.  Cardiovascular:     Rate and Rhythm: Normal rate and regular rhythm.     Pulses: Normal pulses.     Heart sounds: Normal heart sounds.  Pulmonary:     Effort: Pulmonary effort is normal.     Breath sounds: Normal breath sounds.  Musculoskeletal:     Cervical back: Normal range of motion and neck supple.     Comments: Normal Muscle Bulk and Muscle Testing Reveals:  Upper Extremities: Full ROM and Muscle Strength 5/5 Right AC Joint Tenderness  Thoracic Paraspinal Tenderness: T-1-T-4 Mainly Right Side Lumbar Paraspinal Tenderness: L-3-L-5 Lower Extremities: Full ROM and Muscle Strength 5/5 Arises from Table with ease using cane for support Narrow Based  Gait   Skin:    General: Skin is warm and dry.  Neurological:     Mental Status: She is alert and oriented to person, place, and time.  Psychiatric:        Mood and Affect: Mood normal.        Behavior: Behavior normal.           Assessment & Plan:  1. History of  chronic lumbar facet disease with spondylosis/DDD/ L4-5 spondylolisthesis.With L4-5 L5-S1 decompression stabilization:11/21/2019 Refilled: Hydrocodone 10/325mg  one tablet every 6 hours as needed #120. We will continue the opioid monitoring program, this consists of regular clinic visits, examinations, urine drug screen, pill counts as well as use of [place] Controlled Substance Reporting System. 2. Depression with anxiety/ PTSD: Dr. Sima Matas Following.Continue Xanax,Abilifyand Effexor. 11/21/2019 3. Cervical spondylosis: Stable at this time with no complaints. Continue Medication Regime.11/21/2019 4. 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 Continue Ritalin 10 mg one tablet twice a day 0700 and noon #60. We will Begin with slow Titration in March: She verbalizes understanding. 11/21/2019. 5. Muscle Spasm: Continuecurrent medication regimen withFlexeril: May take 1-2 tablets at HS.11/21/2019 6. Left Lumbar Radiculitis:Continue to Monitor.11/21/2019 7. Left Knee Osteoarthritis:No complaints today.S/PLeft Knee Injectionon 01/15/2020with good relief noted.11/21/2019 8. Right Shoulder Tendonitis:Continue HEP as tolerated. Continue to monitor.11/21/2019  F/U in 1 month

## 2019-11-22 ENCOUNTER — Ambulatory Visit: Payer: BC Managed Care – PPO | Admitting: Registered Nurse

## 2019-11-22 DIAGNOSIS — H25012 Cortical age-related cataract, left eye: Secondary | ICD-10-CM | POA: Diagnosis not present

## 2019-11-22 DIAGNOSIS — H2512 Age-related nuclear cataract, left eye: Secondary | ICD-10-CM | POA: Diagnosis not present

## 2019-11-23 ENCOUNTER — Ambulatory Visit: Payer: BC Managed Care – PPO

## 2019-11-28 DIAGNOSIS — H25011 Cortical age-related cataract, right eye: Secondary | ICD-10-CM | POA: Diagnosis not present

## 2019-11-28 DIAGNOSIS — H2511 Age-related nuclear cataract, right eye: Secondary | ICD-10-CM | POA: Diagnosis not present

## 2019-12-06 DIAGNOSIS — H25011 Cortical age-related cataract, right eye: Secondary | ICD-10-CM | POA: Diagnosis not present

## 2019-12-06 DIAGNOSIS — H25811 Combined forms of age-related cataract, right eye: Secondary | ICD-10-CM | POA: Diagnosis not present

## 2019-12-06 DIAGNOSIS — H2511 Age-related nuclear cataract, right eye: Secondary | ICD-10-CM | POA: Diagnosis not present

## 2019-12-12 ENCOUNTER — Ambulatory Visit
Admission: RE | Admit: 2019-12-12 | Discharge: 2019-12-12 | Disposition: A | Discharge status: Home / Self Care | Payer: BC Managed Care – PPO | Source: Ambulatory Visit | Attending: Family Medicine | Admitting: Family Medicine

## 2019-12-12 ENCOUNTER — Other Ambulatory Visit: Payer: Self-pay

## 2019-12-12 DIAGNOSIS — Z1231 Encounter for screening mammogram for malignant neoplasm of breast: Secondary | ICD-10-CM

## 2019-12-20 ENCOUNTER — Encounter: Payer: BC Managed Care – PPO | Attending: Physical Medicine and Rehabilitation | Admitting: Registered Nurse

## 2019-12-20 ENCOUNTER — Other Ambulatory Visit: Payer: Self-pay

## 2019-12-20 VITALS — BP 160/99 | HR 97 | Temp 99.0°F | Ht 61.0 in | Wt 169.0 lb

## 2019-12-20 DIAGNOSIS — M47816 Spondylosis without myelopathy or radiculopathy, lumbar region: Secondary | ICD-10-CM

## 2019-12-20 DIAGNOSIS — G894 Chronic pain syndrome: Secondary | ICD-10-CM

## 2019-12-20 DIAGNOSIS — M47817 Spondylosis without myelopathy or radiculopathy, lumbosacral region: Secondary | ICD-10-CM

## 2019-12-20 DIAGNOSIS — M461 Sacroiliitis, not elsewhere classified: Secondary | ICD-10-CM | POA: Insufficient documentation

## 2019-12-20 DIAGNOSIS — M961 Postlaminectomy syndrome, not elsewhere classified: Secondary | ICD-10-CM

## 2019-12-20 DIAGNOSIS — F411 Generalized anxiety disorder: Secondary | ICD-10-CM | POA: Diagnosis not present

## 2019-12-20 DIAGNOSIS — M5416 Radiculopathy, lumbar region: Secondary | ICD-10-CM | POA: Diagnosis not present

## 2019-12-20 DIAGNOSIS — F0781 Postconcussional syndrome: Secondary | ICD-10-CM

## 2019-12-20 DIAGNOSIS — Z5181 Encounter for therapeutic drug level monitoring: Secondary | ICD-10-CM

## 2019-12-20 DIAGNOSIS — M62838 Other muscle spasm: Secondary | ICD-10-CM

## 2019-12-20 DIAGNOSIS — Z79891 Long term (current) use of opiate analgesic: Secondary | ICD-10-CM

## 2019-12-20 MED ORDER — VENLAFAXINE HCL ER 150 MG PO CP24: ORAL_CAPSULE | ORAL | 3 refills | Status: DC

## 2019-12-20 MED ORDER — HYDROCODONE-ACETAMINOPHEN 10-325 MG PO TABS: 1.0000 | ORAL_TABLET | Freq: Four times a day (QID) | ORAL | 0 refills | Status: DC | PRN

## 2019-12-20 MED ORDER — METHYLPHENIDATE HCL 10 MG PO TABS: 10.0000 mg | ORAL_TABLET | Freq: Two times a day (BID) | ORAL | 0 refills | Status: DC

## 2019-12-20 NOTE — Progress Notes (Signed)
Subjective:    Patient ID: [name], female    DOB: 09-15-1956, 64 y.o.   MRN: [id number]  HPI: [name] is a 64 y.o. female who returns for follow up appointment for chronic pain and medication refill. She states her pain is located in her lower back radiating into her left lower extremity. She rates her pain 9. Her current exercise regime is walking and performing stretching exercises.  [name] Morphine equivalent is  40.00  MME.  She  is also prescribed Alprazolam. We have discussed the black box warning of using opioids and benzodiazepines. I highlighted the dangers of using these drugs together and discussed the adverse events including respiratory suppression, overdose, cognitive impairment and importance of compliance with current regimen. We will continue to monitor and adjust as indicated.   [name] states her mother-in-law was diagnosed with Cancer, and will be living with them. Emotional support given.  Last Oral Swab was Performed on 08/02/2019, it was consistent.   Pain Inventory Average Pain 9 Pain Right Now 9 My pain is sharp, burning, dull, stabbing, tingling and aching  In the last 24 hours, has pain interfered with the following? General activity 10 Relation with others 10 Enjoyment of life 10 What TIME of day is your pain at its worst? varies with activity Sleep (in general) Fair  Pain is worse with: walking, sitting, inactivity, standing and some activites Pain improves with: rest, heat/ice, pacing activities and medication Relief from Meds: 9  Mobility walk with assistance use a cane how many minutes can you walk? varies ability to climb steps?  yes do you drive?  yes Do you have any goals in this area?  yes  Function disabled: date disabled . I need assistance with the following:  dressing, meal prep, household duties and shopping  Neuro/Psych bladder control problems weakness numbness tremor tingling trouble  walking spasms dizziness anxiety  Prior Studies Any changes since last visit?  no  Physicians involved in your care    Family History  Problem Relation Age of Onset  . Cervical cancer Mother   . Diabetes Mother   . Stroke Mother   . Alzheimer's disease Mother   . Heart attack Father   . Stroke Father   . Colon cancer Father   . Anxiety disorder Maternal Aunt   . Depression Maternal Aunt   . Anxiety disorder Maternal Uncle   . Depression Maternal Uncle        suicide attempt by gun shot wound to head. Survived  . Alcohol abuse Other   . Drug abuse Other   . Breast cancer Maternal Grandmother   . Diabetes Maternal Grandmother   . Breast cancer Paternal Aunt   . Colon polyps Sister        x 2   Social History   Socioeconomic History  . Marital status: Married    Spouse name: Not on file  . Number of children: 0  . Years of education: Not on file  . Highest education level: Not on file  Occupational History  . Occupation: disabled  Tobacco Use  . Smoking status: Never Smoker  . Smokeless tobacco: Never Used  Substance and Sexual Activity  . Alcohol use: No    Alcohol/week: 0.0 standard drinks  . Drug use: No  . Sexual activity: Yes  Other Topics Concern  . Not on file  Social History Narrative   Pt has h.s. degree   Social Determinants of Health  Financial Resource Strain:   . Difficulty of Paying Living Expenses: Not on file  Food Insecurity:   . Worried About Charity fundraiser in the Last Year: Not on file  . Ran Out of Food in the Last Year: Not on file  Transportation Needs:   . Lack of Transportation (Medical): Not on file  . Lack of Transportation (Non-Medical): Not on file  Physical Activity:   . Days of Exercise per Week: Not on file  . Minutes of Exercise per Session: Not on file  Stress:   . Feeling of Stress : Not on file  Social Connections:   . Frequency of Communication with Friends and Family: Not on file  . Frequency of Social  Gatherings with Friends and Family: Not on file  . Attends Religious Services: Not on file  . Active Member of Clubs or Organizations: Not on file  . Attends Archivist Meetings: Not on file  . Marital Status: Not on file   Past Surgical History:  Procedure Laterality Date  . ABDOMINAL HYSTERECTOMY  1999  . bunions removed  1988  . C4 C5 cage insertion  06/28/2013  . CHOLECYSTECTOMY  04/25/2008  . EDG  12/17/2004  . ELECTROCARDIOGRAM  05/27/2007  . L5 S1 rod insertion  11/07/2012  . SKIN CANCER EXCISION    . SKIN CANCER EXCISION     several  . TONSILLECTOMY  1981  . TUBAL LIGATION  1997   Past Medical History:  Diagnosis Date  . Anemia   . ANXIETY 06/10/2007  . Cataract   . Chronic headaches   . Degenerative arthritis of spine    back and neck  . Depression   . DYSLIPIDEMIA 06/28/2008  . Esophageal stricture   . Fibromyalgia   . Gallstones   . GERD 06/10/2007  . Hiatal hernia   . 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 HYPERTENSION 06/10/2007  . Irritable bowel syndrome 06/28/2008  . OSTEOARTHRITIS 06/10/2007  . Skin cancer    basal cell  . Tubular adenoma of colon    BP (!) 160/99   Pulse 97   Temp 99 F (37.2 C)   Ht 5\' 1"  (1.549 m)   Wt 169 lb (76.7 kg)   BMI 31.93 kg/m   Opioid Risk Score:   Fall Risk Score:  `1  Depression screen PHQ 2/9  Depression screen [facility] 2/9 10/24/2019 10/18/2019 03/13/2019 02/07/2019 12/06/2018 09/22/2018 09/20/2018  Decreased Interest 1 1 1 1 1 1 1   Down, Depressed, Hopeless 1 1 1 1 1 1 1   PHQ - 2 Score 2 2 2 2 2 2 2   Altered sleeping 0 - - - - - 1  Tired, decreased energy 3 - - - - - 1  Change in appetite 3 - - - - - 1  Feeling bad or failure about yourself  0 - - - - - 1  Trouble concentrating 0 - - - - - 1  Moving slowly or fidgety/restless 0 - - - - - 1  Suicidal thoughts 0 - - - - - 0  PHQ-9 Score 8 - - - - - 8  Difficult doing work/chores - - - - - - Extremely dIfficult  Some recent data might be hidden    Review of Systems    Constitutional: Positive for unexpected weight change.  HENT: Negative.   Eyes: Negative.   Respiratory: Positive for shortness of breath.   Cardiovascular: Negative.   Gastrointestinal: Negative.   Endocrine: Negative.  Genitourinary: Positive for difficulty urinating.  Musculoskeletal: Positive for arthralgias, back pain and gait problem.  Skin: Negative.   Allergic/Immunologic: Negative.   Neurological: Positive for tremors, weakness and numbness.       Tingling  Hematological: Negative.   Psychiatric/Behavioral: The patient is nervous/anxious.   All other systems reviewed and are negative.      Objective:   Physical Exam Vitals and nursing note reviewed.  Constitutional:      Appearance: Normal appearance.  Cardiovascular:     Rate and Rhythm: Normal rate and regular rhythm.     Pulses: Normal pulses.     Heart sounds: Normal heart sounds.  Pulmonary:     Effort: Pulmonary effort is normal.     Breath sounds: Normal breath sounds.  Musculoskeletal:     Cervical back: Normal range of motion and neck supple.     Comments: Normal Muscle Bulk and Muscle Testing Reveals:  Upper Extremities: Full ROM and Muscle Strength 5/5  Right AC Joint Tenderness  Lumbar Hypersensitivity Lower Extremities: Full ROM and Muscle Strength 5/5 Arises from Table with ease Narrow Based Gait   Skin:    General: Skin is warm and dry.     Coloration: Skin is not pale.  Neurological:     Mental Status: She is alert and oriented to person, place, and time.  Psychiatric:        Mood and Affect: Mood normal.        Behavior: Behavior normal.           Assessment & Plan:  1. History of chronic lumbar facet disease with spondylosis/DDD/ L4-5 spondylolisthesis.With L4-5 L5-S1 decompression stabilization:12/20/2019 Refilled:Hydrocodone 10/325mg  one tablet every 6 hours as needed #120. We will continue the opioid monitoring program, this consists of regular clinic visits, examinations,  urine drug screen, pill counts as well as use of [place] Controlled Substance Reporting System. 2. Depression with anxiety/ PTSD: Dr. Sima Matas Following.Continue Xanax,Abilifyand Effexor.12/20/2019 3. Cervical spondylosis: Stable at this time with no complaints. Continue Medication Regime.12/20/2019 4. 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 Continue Ritalin 10 mg one tablet twice a day 0700 and noon #60.12/20/2019. 5. Muscle Spasm: Continuecurrent medication regimen withFlexeril: May take 1-2 tablets at HS.12/20/2019 6. Left Lumbar Radiculitis:Continue to Monitor.12/20/2019 7. Left Knee Osteoarthritis:No complaints today.S/PLeft Knee Injectionon 01/15/2020with good relief noted.12/20/2019 8. Right Shoulder Tendonitis:Continue to Alternate Ice and Heat Therapy. Continue HEP as tolerated. Continue to monitor.12/20/2019  F/U in 1 month  15 minutes of face to face patient care time was spent during this visit. All questions were encouraged and answered.

## 2019-12-24 ENCOUNTER — Other Ambulatory Visit: Payer: Self-pay | Admitting: Registered Nurse

## 2020-01-18 ENCOUNTER — Other Ambulatory Visit: Payer: Self-pay

## 2020-01-18 ENCOUNTER — Encounter: Payer: Self-pay | Admitting: Registered Nurse

## 2020-01-18 ENCOUNTER — Encounter: Payer: BC Managed Care – PPO | Attending: Physical Medicine and Rehabilitation | Admitting: Registered Nurse

## 2020-01-18 VITALS — BP 124/84 | HR 98 | Temp 98.7°F | Ht 61.0 in | Wt 169.2 lb

## 2020-01-18 DIAGNOSIS — F0781 Postconcussional syndrome: Secondary | ICD-10-CM | POA: Diagnosis not present

## 2020-01-18 DIAGNOSIS — M47817 Spondylosis without myelopathy or radiculopathy, lumbosacral region: Secondary | ICD-10-CM | POA: Diagnosis not present

## 2020-01-18 DIAGNOSIS — M961 Postlaminectomy syndrome, not elsewhere classified: Secondary | ICD-10-CM | POA: Diagnosis not present

## 2020-01-18 DIAGNOSIS — Z79891 Long term (current) use of opiate analgesic: Secondary | ICD-10-CM | POA: Diagnosis not present

## 2020-01-18 DIAGNOSIS — M47816 Spondylosis without myelopathy or radiculopathy, lumbar region: Secondary | ICD-10-CM

## 2020-01-18 DIAGNOSIS — M62838 Other muscle spasm: Secondary | ICD-10-CM

## 2020-01-18 DIAGNOSIS — G894 Chronic pain syndrome: Secondary | ICD-10-CM | POA: Diagnosis not present

## 2020-01-18 DIAGNOSIS — F411 Generalized anxiety disorder: Secondary | ICD-10-CM | POA: Insufficient documentation

## 2020-01-18 DIAGNOSIS — M5416 Radiculopathy, lumbar region: Secondary | ICD-10-CM

## 2020-01-18 DIAGNOSIS — Z5181 Encounter for therapeutic drug level monitoring: Secondary | ICD-10-CM | POA: Insufficient documentation

## 2020-01-18 DIAGNOSIS — M461 Sacroiliitis, not elsewhere classified: Secondary | ICD-10-CM | POA: Diagnosis not present

## 2020-01-18 MED ORDER — ALPRAZOLAM 1 MG PO TABS: ORAL_TABLET | ORAL | 2 refills | Status: DC

## 2020-01-18 MED ORDER — HYDROCODONE-ACETAMINOPHEN 10-325 MG PO TABS: 1.0000 | ORAL_TABLET | Freq: Four times a day (QID) | ORAL | 0 refills | Status: DC | PRN

## 2020-01-18 MED ORDER — METHYLPHENIDATE HCL 10 MG PO TABS: 10.0000 mg | ORAL_TABLET | Freq: Two times a day (BID) | ORAL | 0 refills | Status: DC

## 2020-01-18 NOTE — Progress Notes (Signed)
Subjective:    Patient ID: [name], female    DOB: 01-07-1956, 64 y.o.   MRN: [id number]  HPI: [name] is a 64 y.o. female who returns for follow up appointment for chronic pain and medication refill. She states her pain is located in her lower back radiating into her left lower extremity. She rates her pain 9. Her current exercise regime is walking and performing stretching exercises.  [name] Morphine equivalent is 40.00 MME.She is also prescribed Alprazolam. We have discussed the black box warning of using opioids and benzodiazepines. I highlighted the dangers of using these drugs together and discussed the adverse events including respiratory suppression, overdose, cognitive impairment and importance of compliance with current regimen. We will continue to monitor and adjust as indicated.    Oral Swab was Performed Today.   Pain Inventory Average Pain 9 Pain Right Now 9 My pain is sharp, burning, dull, stabbing, tingling and aching  In the last 24 hours, has pain interfered with the following? General activity 10 Relation with others 10 Enjoyment of life 10 What TIME of day is your pain at its worst? varies Sleep (in general) Fair  Pain is worse with: walking, sitting, inactivity, standing and some activites Pain improves with: rest, heat/ice, pacing activities and medication Relief from Meds: 9  Mobility use a cane how many minutes can you walk? varies ability to climb steps?  yes do you drive?  yes  Function disabled: date disabled 11/2008 I need assistance with the following:  dressing, meal prep, household duties and shopping  Neuro/Psych bladder control problems weakness numbness tremor tingling trouble walking spasms dizziness depression anxiety loss of taste or smell  Prior Studies Any changes since last visit?  yes  Physicians involved in your care Any changes since last visit?  no   Family History  Problem Relation Age of  Onset  . Cervical cancer Mother   . Diabetes Mother   . Stroke Mother   . Alzheimer's disease Mother   . Heart attack Father   . Stroke Father   . Colon cancer Father   . Anxiety disorder Maternal Aunt   . Depression Maternal Aunt   . Anxiety disorder Maternal Uncle   . Depression Maternal Uncle        suicide attempt by gun shot wound to head. Survived  . Alcohol abuse Other   . Drug abuse Other   . Breast cancer Maternal Grandmother   . Diabetes Maternal Grandmother   . Breast cancer Paternal Aunt   . Colon polyps Sister        x 2   Social History   Socioeconomic History  . Marital status: Married    Spouse name: Not on file  . Number of children: 0  . Years of education: Not on file  . Highest education level: Not on file  Occupational History  . Occupation: disabled  Tobacco Use  . Smoking status: Never Smoker  . Smokeless tobacco: Never Used  Substance and Sexual Activity  . Alcohol use: No    Alcohol/week: 0.0 standard drinks  . Drug use: No  . Sexual activity: Yes  Other Topics Concern  . Not on file  Social History Narrative   Pt has h.s. degree   Social Determinants of Health   Financial Resource Strain:   . Difficulty of Paying Living Expenses:   Food Insecurity:   . Worried About Charity fundraiser in the Last Year:   .  Ran Out of Food in the Last Year:   Transportation Needs:   . Film/video editor (Medical):   Marland Kitchen Lack of Transportation (Non-Medical):   Physical Activity:   . Days of Exercise per Week:   . Minutes of Exercise per Session:   Stress:   . Feeling of Stress :   Social Connections:   . Frequency of Communication with Friends and Family:   . Frequency of Social Gatherings with Friends and Family:   . Attends Religious Services:   . Active Member of Clubs or Organizations:   . Attends Archivist Meetings:   Marland Kitchen Marital Status:    Past Surgical History:  Procedure Laterality Date  . ABDOMINAL HYSTERECTOMY  1999  .  bunions removed  1988  . C4 C5 cage insertion  06/28/2013  . CHOLECYSTECTOMY  04/25/2008  . EDG  12/17/2004  . ELECTROCARDIOGRAM  05/27/2007  . L5 S1 rod insertion  11/07/2012  . SKIN CANCER EXCISION    . SKIN CANCER EXCISION     several  . TONSILLECTOMY  1981  . TUBAL LIGATION  1997   Past Medical History:  Diagnosis Date  . Anemia   . ANXIETY 06/10/2007  . Cataract   . Chronic headaches   . Degenerative arthritis of spine    back and neck  . Depression   . DYSLIPIDEMIA 06/28/2008  . Esophageal stricture   . Fibromyalgia   . Gallstones   . GERD 06/10/2007  . Hiatal hernia   . 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 HYPERTENSION 06/10/2007  . Irritable bowel syndrome 06/28/2008  . OSTEOARTHRITIS 06/10/2007  . Skin cancer    basal cell  . Tubular adenoma of colon    BP 124/84   Pulse 98   Temp 98.7 F (37.1 C)   Ht 5\' 1"  (1.549 m)   Wt 169 lb 3.2 oz (76.7 kg)   SpO2 95%   BMI 31.97 kg/m   Opioid Risk Score:   Fall Risk Score:  `1  Depression screen PHQ 2/9  Depression screen [facility] 2/9 10/24/2019 10/18/2019 03/13/2019 02/07/2019 12/06/2018 09/22/2018 09/20/2018  Decreased Interest 1 1 1 1 1 1 1   Down, Depressed, Hopeless 1 1 1 1 1 1 1   PHQ - 2 Score 2 2 2 2 2 2 2   Altered sleeping 0 - - - - - 1  Tired, decreased energy 3 - - - - - 1  Change in appetite 3 - - - - - 1  Feeling bad or failure about yourself  0 - - - - - 1  Trouble concentrating 0 - - - - - 1  Moving slowly or fidgety/restless 0 - - - - - 1  Suicidal thoughts 0 - - - - - 0  PHQ-9 Score 8 - - - - - 8  Difficult doing work/chores - - - - - - Extremely dIfficult  Some recent data might be hidden   Review of Systems  Constitutional: Positive for appetite change and unexpected weight change.  Neurological: Positive for weakness and numbness.  Psychiatric/Behavioral: Positive for dysphoric mood. The patient is nervous/anxious.   All other systems reviewed and are negative.      Objective:   Physical Exam Vitals and nursing note  reviewed.  Constitutional:      Appearance: Normal appearance.  Cardiovascular:     Rate and Rhythm: Normal rate and regular rhythm.     Pulses: Normal pulses.     Heart sounds: Normal heart sounds.  Pulmonary:     Effort: Pulmonary effort is normal.     Breath sounds: Normal breath sounds.  Musculoskeletal:     Cervical back: Normal range of motion and neck supple.     Comments: Normal Muscle Bulk and Muscle Testing Reveals:  Upper Extremities: Full ROM and Muscle Strength 5/5  Lumbar Paraspinal Tenderness: L-3-L-5 Lower Extremities: Full ROM and Muscle Strength 5/5 Arises from Table with ease Narrow Based Gait   Skin:    General: Skin is warm and dry.  Neurological:     Mental Status: She is alert and oriented to person, place, and time.  Psychiatric:        Mood and Affect: Mood normal.        Behavior: Behavior normal.           Assessment & Plan:  1. History of chronic lumbar facet disease with spondylosis/DDD/ L4-5 spondylolisthesis.With L4-5 L5-S1 decompression stabilization:01/18/2020 Refilled:Hydrocodone 10/325mg  one tablet every 6 hours as needed #120. We will continue the opioid monitoring program, this consists of regular clinic visits, examinations, urine drug screen, pill counts as well as use of [place] Controlled Substance Reporting System. 2. Depression with anxiety/ PTSD: Dr. Sima Matas Following.Continue Xanax,Abilifyand Effexor.01/18/2020 3. Cervical spondylosis: Stable at this time with no complaints. Continue Medication Regime.01/18/2020 4. 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 Continue Ritalin 10 mg one tablet twice a day 0700 and noon #60.01/18/2020. 5. Muscle Spasm: Continuecurrent medication regimen withFlexeril: May take 1-2 tablets at HS.01/18/2020 6. Left Lumbar Radiculitis:Continue to Monitor.01/18/2020 7. Left Knee Osteoarthritis:No complaints today.S/PLeft Knee  Injectionon 01/15/2020with good relief noted.01/18/2020 8. Right ShoulderTendonitis:No complaints Today. Continue to Alternate Ice and Heat Therapy. ContinueHEPas tolerated. Continue to monitor.01/18/2020  F/U in 1 month  15 minutes of face to face patient care time was spent during this visit. All questions were encouraged and answered.

## 2020-01-24 LAB — DRUG TOX ALC METAB W/CON, ORAL FLD: Alcohol Metabolite: NEGATIVE ng/mL (ref <25)

## 2020-01-24 LAB — DRUG TOX MONITOR 1 W/CONF, ORAL FLD
Amphetamines: NEGATIVE ng/mL (ref <10)
Barbiturates: NEGATIVE ng/mL (ref <10)
Benzodiazepines: NEGATIVE ng/mL (ref <0.50)
Buprenorphine: NEGATIVE ng/mL (ref <0.10)
Chlordiazepoxide: NEGATIVE ng/mL (ref <0.50)
Clonazepam: NEGATIVE ng/mL (ref <0.50)
Cocaine: NEGATIVE ng/mL (ref <5.0)
Codeine: NEGATIVE ng/mL (ref <2.5)
Diazepam: NEGATIVE ng/mL (ref <0.50)
Dihydrocodeine: 13.2 ng/mL — ABNORMAL HIGH (ref <2.5)
Fentanyl: NEGATIVE ng/mL (ref <0.10)
Flunitrazepam: NEGATIVE ng/mL (ref <0.50)
Flurazepam: NEGATIVE ng/mL (ref <0.50)
Heroin Metabolite: NEGATIVE ng/mL (ref <1.0)
Hydrocodone: 204.9 ng/mL — ABNORMAL HIGH (ref <2.5)
Hydromorphone: NEGATIVE ng/mL (ref <2.5)
Lorazepam: NEGATIVE ng/mL (ref <0.50)
MARIJUANA: NEGATIVE ng/mL (ref <2.5)
MDMA: NEGATIVE ng/mL (ref <10)
Meprobamate: NEGATIVE ng/mL (ref <2.5)
Methadone: NEGATIVE ng/mL (ref <5.0)
Midazolam: NEGATIVE ng/mL (ref <0.50)
Morphine: NEGATIVE ng/mL (ref <2.5)
Nicotine Metabolite: NEGATIVE ng/mL (ref <5.0)
Nordiazepam: NEGATIVE ng/mL (ref <0.50)
Norhydrocodone: 10.6 ng/mL — ABNORMAL HIGH (ref <2.5)
Noroxycodone: NEGATIVE ng/mL (ref <2.5)
Opiates: POSITIVE ng/mL — AB (ref <2.5)
Oxazepam: NEGATIVE ng/mL (ref <0.50)
Oxycodone: NEGATIVE ng/mL (ref <2.5)
Oxymorphone: NEGATIVE ng/mL (ref <2.5)
Phencyclidine: NEGATIVE ng/mL (ref <10)
Tapentadol: NEGATIVE ng/mL (ref <5.0)
Temazepam: NEGATIVE ng/mL (ref <0.50)
Tramadol: NEGATIVE ng/mL (ref <5.0)
Triazolam: NEGATIVE ng/mL (ref <0.50)
Zolpidem: NEGATIVE ng/mL (ref <5.0)

## 2020-01-24 LAB — DRUG TOX METHYLPHEN W/CONF,ORAL FLD
Methylphenidate: 34.4 ng/mL — ABNORMAL HIGH (ref <1.0)
Methylphenidate: POSITIVE ng/mL — AB (ref <1.0)

## 2020-01-29 ENCOUNTER — Telehealth: Payer: Self-pay | Admitting: *Deleted

## 2020-01-29 NOTE — Telephone Encounter (Signed)
Oral swab drug screen was consistent for prescribed medications.  ?

## 2020-02-13 ENCOUNTER — Encounter: Payer: BC Managed Care – PPO | Attending: Physical Medicine and Rehabilitation | Admitting: Registered Nurse

## 2020-02-13 ENCOUNTER — Other Ambulatory Visit: Payer: Self-pay

## 2020-02-13 ENCOUNTER — Encounter: Payer: Self-pay | Admitting: Registered Nurse

## 2020-02-13 VITALS — BP 144/89 | HR 88 | Temp 97.5°F | Ht 61.0 in | Wt 169.0 lb

## 2020-02-13 DIAGNOSIS — Z5181 Encounter for therapeutic drug level monitoring: Secondary | ICD-10-CM | POA: Diagnosis not present

## 2020-02-13 DIAGNOSIS — M47817 Spondylosis without myelopathy or radiculopathy, lumbosacral region: Secondary | ICD-10-CM | POA: Insufficient documentation

## 2020-02-13 DIAGNOSIS — G894 Chronic pain syndrome: Secondary | ICD-10-CM | POA: Diagnosis not present

## 2020-02-13 DIAGNOSIS — M461 Sacroiliitis, not elsewhere classified: Secondary | ICD-10-CM | POA: Diagnosis not present

## 2020-02-13 DIAGNOSIS — M62838 Other muscle spasm: Secondary | ICD-10-CM

## 2020-02-13 DIAGNOSIS — F411 Generalized anxiety disorder: Secondary | ICD-10-CM | POA: Diagnosis not present

## 2020-02-13 DIAGNOSIS — M5416 Radiculopathy, lumbar region: Secondary | ICD-10-CM | POA: Diagnosis not present

## 2020-02-13 DIAGNOSIS — M47816 Spondylosis without myelopathy or radiculopathy, lumbar region: Secondary | ICD-10-CM

## 2020-02-13 DIAGNOSIS — F0781 Postconcussional syndrome: Secondary | ICD-10-CM

## 2020-02-13 DIAGNOSIS — M961 Postlaminectomy syndrome, not elsewhere classified: Secondary | ICD-10-CM | POA: Diagnosis not present

## 2020-02-13 DIAGNOSIS — Z79891 Long term (current) use of opiate analgesic: Secondary | ICD-10-CM | POA: Insufficient documentation

## 2020-02-13 MED ORDER — HYDROCODONE-ACETAMINOPHEN 10-325 MG PO TABS: 1.0000 | ORAL_TABLET | Freq: Four times a day (QID) | ORAL | 0 refills | Status: DC | PRN

## 2020-02-13 MED ORDER — METHYLPHENIDATE HCL 10 MG PO TABS: 10.0000 mg | ORAL_TABLET | Freq: Two times a day (BID) | ORAL | 0 refills | Status: DC

## 2020-02-13 NOTE — Progress Notes (Signed)
Subjective:    Patient ID: [name], female    DOB: 08-25-1956, 64 y.o.   MRN: [id number]  HPI: [name] is a 64 y.o. female who returns for follow up appointment for chronic pain and medication refill. She states her pain is located in her lower back radiating into her left lower extremity. She rates her pain 9. Her current exercise regime is walking and performing stretching exercises.  [name] is  40.00 MME. She is also prescribed Alprazolam. We have discussed the black box warning of using opioids and benzodiazepines. I highlighted the dangers of using these drugs together and discussed the adverse events including respiratory suppression, overdose, cognitive impairment and importance of compliance with current regimen. We will continue to monitor and adjust as indicated.    Last Oral Swab was Performed on 01/18/2020, it was consistent.    Pain Inventory Average Pain 9 Pain Right Now 9 My pain is constant, sharp, burning, dull, stabbing, tingling and aching  In the last 24 hours, has pain interfered with the following? General activity 10 Relation with others 10 Enjoyment of life 10 What TIME of day is your pain at its worst? all Sleep (in general) Fair  Pain is worse with: walking, sitting, inactivity, standing and some activites Pain improves with: rest, heat/ice, pacing activities, medication and . Relief from Meds: 9  Mobility walk with assistance use a cane how many minutes can you walk? varies ability to climb steps?  yes do you drive?  yes Do you have any goals in this area?  yes  Function disabled: date disabled . I need assistance with the following:  dressing, meal prep, household duties and shopping Do you have any goals in this area?  yes  Neuro/Psych bladder control problems weakness numbness tremor tingling trouble walking spasms dizziness confusion depression anxiety  Prior Studies Any changes since  last visit?  no  Physicians involved in your care Any changes since last visit?  no   Family History  Problem Relation Age of Onset  . Cervical cancer Mother   . Diabetes Mother   . Stroke Mother   . Alzheimer's disease Mother   . Heart attack Father   . Stroke Father   . Colon cancer Father   . Anxiety disorder Maternal Aunt   . Depression Maternal Aunt   . Anxiety disorder Maternal Uncle   . Depression Maternal Uncle        suicide attempt by gun shot wound to head. Survived  . Alcohol abuse Other   . Drug abuse Other   . Breast cancer Maternal Grandmother   . Diabetes Maternal Grandmother   . Breast cancer Paternal Aunt   . Colon polyps Sister        x 2   Social History   Socioeconomic History  . Marital status: Married    Spouse name: Not on file  . Number of children: 0  . Years of education: Not on file  . Highest education level: Not on file  Occupational History  . Occupation: disabled  Tobacco Use  . Smoking status: Never Smoker  . Smokeless tobacco: Never Used  Substance and Sexual Activity  . Alcohol use: No    Alcohol/week: 0.0 standard drinks  . Drug use: No  . Sexual activity: Yes  Other Topics Concern  . Not on file  Social History Narrative   Pt has h.s. degree   Social Determinants of Radio broadcast assistant  Strain:   . Difficulty of Paying Living Expenses:   Food Insecurity:   . Worried About Charity fundraiser in the Last Year:   . Arboriculturist in the Last Year:   Transportation Needs:   . Film/video editor (Medical):   Marland Kitchen Lack of Transportation (Non-Medical):   Physical Activity:   . Days of Exercise per Week:   . Minutes of Exercise per Session:   Stress:   . Feeling of Stress :   Social Connections:   . Frequency of Communication with Friends and Family:   . Frequency of Social Gatherings with Friends and Family:   . Attends Religious Services:   . Active Member of Clubs or Organizations:   . Attends Theatre manager Meetings:   Marland Kitchen Marital Status:    Past Surgical History:  Procedure Laterality Date  . ABDOMINAL HYSTERECTOMY  1999  . bunions removed  1988  . C4 C5 cage insertion  06/28/2013  . CHOLECYSTECTOMY  04/25/2008  . EDG  12/17/2004  . ELECTROCARDIOGRAM  05/27/2007  . L5 S1 rod insertion  11/07/2012  . SKIN CANCER EXCISION    . SKIN CANCER EXCISION     several  . TONSILLECTOMY  1981  . TUBAL LIGATION  1997   Past Medical History:  Diagnosis Date  . Anemia   . ANXIETY 06/10/2007  . Cataract   . Chronic headaches   . Degenerative arthritis of spine    back and neck  . Depression   . DYSLIPIDEMIA 06/28/2008  . Esophageal stricture   . Fibromyalgia   . Gallstones   . GERD 06/10/2007  . Hiatal hernia   . 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 HYPERTENSION 06/10/2007  . Irritable bowel syndrome 06/28/2008  . OSTEOARTHRITIS 06/10/2007  . Skin cancer    basal cell  . Tubular adenoma of colon    BP (!) 144/89   Pulse 88   Temp (!) 97.5 F (36.4 C)   Ht 5\' 1"  (1.549 m)   Wt 169 lb (76.7 kg)   SpO2 98%   BMI 31.93 kg/m   Opioid Risk Score:   Fall Risk Score:  `1  Depression screen PHQ 2/9  Depression screen [facility] 2/9 01/18/2020 10/24/2019 10/18/2019 03/13/2019 02/07/2019 12/06/2018 09/22/2018  Decreased Interest 1 1 1 1 1 1 1   Down, Depressed, Hopeless 1 1 1 1 1 1 1   PHQ - 2 Score 2 2 2 2 2 2 2   Altered sleeping - 0 - - - - -  Tired, decreased energy - 3 - - - - -  Change in appetite - 3 - - - - -  Feeling bad or failure about yourself  - 0 - - - - -  Trouble concentrating - 0 - - - - -  Moving slowly or fidgety/restless - 0 - - - - -  Suicidal thoughts - 0 - - - - -  PHQ-9 Score - 8 - - - - -  Difficult doing work/chores - - - - - - -  Some recent data might be hidden    Review of Systems  Constitutional: Positive for appetite change and diaphoresis.  HENT: Negative.   Eyes: Negative.   Respiratory: Negative.   Cardiovascular: Negative.   Gastrointestinal: Positive for abdominal pain.    Endocrine: Negative.   Genitourinary: Positive for difficulty urinating.  Musculoskeletal: Positive for arthralgias, back pain and gait problem.       Spasms  Skin: Negative.  Allergic/Immunologic: Negative.   Neurological: Positive for dizziness, tremors, weakness and numbness.       Tingling   Hematological: Bruises/bleeds easily.  Psychiatric/Behavioral: Positive for confusion and dysphoric mood. The patient is nervous/anxious.   All other systems reviewed and are negative.      Objective:   Physical Exam Vitals and nursing note reviewed.  Constitutional:      Appearance: Normal appearance.  Cardiovascular:     Rate and Rhythm: Normal rate and regular rhythm.     Pulses: Normal pulses.     Heart sounds: Normal heart sounds.  Pulmonary:     Effort: Pulmonary effort is normal.     Breath sounds: Normal breath sounds.  Musculoskeletal:     Cervical back: Normal range of motion and neck supple.     Comments: Normal Muscle Bulk and Muscle Testing Reveals:  Upper Extremities: Full ROM and Muscle Strength 5/5  Lumbar Hypersensitivity Lower Extremities: Full ROM and Muscle Strength 5/5 Arises from chair with ease Narrow Based Gait   Skin:    General: Skin is warm and dry.  Neurological:     Mental Status: She is alert and oriented to person, place, and time.  Psychiatric:        Mood and Affect: Mood normal.        Behavior: Behavior normal.           Assessment & Plan:  1. History of chronic lumbar facet disease with spondylosis/DDD/ L4-5 spondylolisthesis.With L4-5 L5-S1 decompression stabilization:02/13/2020 Refilled:Hydrocodone 10/325mg  one tablet every 6 hours as needed #120. We will continue the opioid monitoring program, this consists of regular clinic visits, examinations, urine drug screen, pill counts as well as use of [place] Controlled Substance Reporting System. 2. Depression with anxiety/ PTSD: Dr. Sima Matas Following.Continue  Xanax,Abilifyand Effexor.02/13/2020 3. Cervical spondylosis: Stable at this time with no complaints. Continue Medication Regime.02/13/2020 4. 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 Continue with slow weaning of  Ritalin 10 mg one tablet twice a day 0700 and noon #50.02/13/2020. 5. Muscle Spasm: Continuecurrent medication regimen withFlexeril: May take 1-2 tablets at HS.02/13/2020 6. Left Lumbar Radiculitis:Continue to Monitor.02/13/2020 7. Left Knee Osteoarthritis:No complaints today.S/PLeft Knee Injectionon 01/15/2020with good relief noted.02/13/2020 8. Right ShoulderTendonitis:No complaints Today. Continue to Alternate Ice and Heat Therapy.ContinueHEPas tolerated. Continue to monitor.02/13/2020  F/U in 1 month  73minutes of face to face patient care time was spent during this visit. All questions were encouraged and answered.

## 2020-03-04 ENCOUNTER — Other Ambulatory Visit: Payer: Self-pay | Admitting: Family Medicine

## 2020-03-08 DIAGNOSIS — H04123 Dry eye syndrome of bilateral lacrimal glands: Secondary | ICD-10-CM | POA: Diagnosis not present

## 2020-03-08 DIAGNOSIS — Z961 Presence of intraocular lens: Secondary | ICD-10-CM | POA: Diagnosis not present

## 2020-03-19 ENCOUNTER — Ambulatory Visit
Admission: RE | Admit: 2020-03-19 | Discharge: 2020-03-19 | Disposition: A | Discharge status: Home / Self Care | Payer: BC Managed Care – PPO | Source: Ambulatory Visit | Attending: Registered Nurse | Admitting: Registered Nurse

## 2020-03-19 ENCOUNTER — Encounter: Payer: Self-pay | Admitting: Registered Nurse

## 2020-03-19 ENCOUNTER — Other Ambulatory Visit: Payer: Self-pay

## 2020-03-19 ENCOUNTER — Encounter: Payer: BC Managed Care – PPO | Attending: Physical Medicine and Rehabilitation | Admitting: Registered Nurse

## 2020-03-19 VITALS — BP 138/86 | HR 85 | Temp 98.3°F | Ht 61.0 in | Wt 172.0 lb

## 2020-03-19 DIAGNOSIS — Z5181 Encounter for therapeutic drug level monitoring: Secondary | ICD-10-CM | POA: Insufficient documentation

## 2020-03-19 DIAGNOSIS — Z79891 Long term (current) use of opiate analgesic: Secondary | ICD-10-CM | POA: Diagnosis not present

## 2020-03-19 DIAGNOSIS — M961 Postlaminectomy syndrome, not elsewhere classified: Secondary | ICD-10-CM

## 2020-03-19 DIAGNOSIS — M47817 Spondylosis without myelopathy or radiculopathy, lumbosacral region: Secondary | ICD-10-CM

## 2020-03-19 DIAGNOSIS — G894 Chronic pain syndrome: Secondary | ICD-10-CM | POA: Insufficient documentation

## 2020-03-19 DIAGNOSIS — M62838 Other muscle spasm: Secondary | ICD-10-CM

## 2020-03-19 DIAGNOSIS — M25561 Pain in right knee: Secondary | ICD-10-CM | POA: Diagnosis not present

## 2020-03-19 DIAGNOSIS — M47816 Spondylosis without myelopathy or radiculopathy, lumbar region: Secondary | ICD-10-CM

## 2020-03-19 DIAGNOSIS — W1800XA Striking against unspecified object with subsequent fall, initial encounter: Secondary | ICD-10-CM

## 2020-03-19 DIAGNOSIS — M461 Sacroiliitis, not elsewhere classified: Secondary | ICD-10-CM | POA: Insufficient documentation

## 2020-03-19 DIAGNOSIS — M5416 Radiculopathy, lumbar region: Secondary | ICD-10-CM

## 2020-03-19 DIAGNOSIS — F411 Generalized anxiety disorder: Secondary | ICD-10-CM | POA: Diagnosis not present

## 2020-03-19 DIAGNOSIS — F0781 Postconcussional syndrome: Secondary | ICD-10-CM | POA: Insufficient documentation

## 2020-03-19 DIAGNOSIS — M7989 Other specified soft tissue disorders: Secondary | ICD-10-CM | POA: Diagnosis not present

## 2020-03-19 MED ORDER — HYDROCODONE-ACETAMINOPHEN 10-325 MG PO TABS: 1.0000 | ORAL_TABLET | Freq: Four times a day (QID) | ORAL | 0 refills | Status: DC | PRN

## 2020-03-19 MED ORDER — ALPRAZOLAM 1 MG PO TABS: ORAL_TABLET | ORAL | 2 refills | Status: DC

## 2020-03-19 NOTE — Progress Notes (Signed)
Subjective:    Patient ID: [name], female    DOB: 16-Nov-1955, 64 y.o.   MRN: [id number]  HPI: [name] is a 64 y.o. female who returns for follow up appointment for chronic pain and medication refill. She states her pain is located in her lower back radiating into her left lower extremity. Also reports right knee pain, she states three weeks ago she was cleaning her ceiling fan and was standing on her bed, when she lost her balanced and fell on her bed and rolled off the bed and landed on a box with glass shoes in it she reports. She was able to pick herself up, she didn't seek medical attention. She reports increase intensity of right knee pain, X-ray ordered. She verbalizes understanding. Educated on Falls Prevention she verbalizes understanding.  She rates her pain 9. She's not following her exercise regime at this time due to right knee pain.   [name] Morphine equivalent is 40.00 MME. She is also prescribed Alprazolam.We have discussed the black box warning of using opioids and benzodiazepines. I highlighted the dangers of using these drugs together and discussed the adverse events including respiratory suppression, overdose, cognitive impairment and importance of compliance with current regimen. We will continue to monitor and adjust as indicated.   [name] is tolerating the slow weaning of Methylphenidate, she is taking it daily at this time. We will continue with the slow weaning. She verbalizes understanding.   [name] Oral Swab was Performed on 01/18/2020, it was consistent.     Pain Inventory Average Pain 9 Pain Right Now 9 My pain is sharp, burning, dull, stabbing, tingling and aching  In the last 24 hours, has pain interfered with the following? General activity 10 Relation with others 10 Enjoyment of life 10 What TIME of day is your pain at its worst? varies Sleep (in general) Fair  Pain is worse with: walking, bending, sitting, inactivity,  standing and some activites Pain improves with: rest, heat/ice, pacing activities and medication Relief from Meds: 9  Mobility use a cane how many minutes can you walk? 5 ability to climb steps?  yes do you drive?  yes  Function disabled: date disabled 02/2014 I need assistance with the following:  dressing, meal prep, household duties and shopping  Neuro/Psych bladder control problems weakness numbness tremor tingling trouble walking spasms dizziness depression anxiety loss of taste or smell  Prior Studies Any changes since last visit?  no  Physicians involved in your care Any changes since last visit?  no   Family History  Problem Relation Age of Onset  . Cervical cancer Mother   . Diabetes Mother   . Stroke Mother   . Alzheimer's disease Mother   . Heart attack Father   . Stroke Father   . Colon cancer Father   . Anxiety disorder Maternal Aunt   . Depression Maternal Aunt   . Anxiety disorder Maternal Uncle   . Depression Maternal Uncle        suicide attempt by gun shot wound to head. Survived  . Alcohol abuse Other   . Drug abuse Other   . Breast cancer Maternal Grandmother   . Diabetes Maternal Grandmother   . Breast cancer Paternal Aunt   . Colon polyps Sister        x 2   Social History   Socioeconomic History  . Marital status: Married    Spouse name: Not on file  . Number of children:  0  . Years of education: Not on file  . Highest education level: Not on file  Occupational History  . Occupation: disabled  Tobacco Use  . Smoking status: Never Smoker  . Smokeless tobacco: Never Used  Substance and Sexual Activity  . Alcohol use: No    Alcohol/week: 0.0 standard drinks  . Drug use: No  . Sexual activity: Yes  Other Topics Concern  . Not on file  Social History Narrative   Pt has h.s. degree   Social Determinants of Health   Financial Resource Strain:   . Difficulty of Paying Living Expenses:   Food Insecurity:   . Worried  About Charity fundraiser in the Last Year:   . Arboriculturist in the Last Year:   Transportation Needs:   . Film/video editor (Medical):   Marland Kitchen Lack of Transportation (Non-Medical):   Physical Activity:   . Days of Exercise per Week:   . Minutes of Exercise per Session:   Stress:   . Feeling of Stress :   Social Connections:   . Frequency of Communication with Friends and Family:   . Frequency of Social Gatherings with Friends and Family:   . Attends Religious Services:   . Active Member of Clubs or Organizations:   . Attends Archivist Meetings:   Marland Kitchen Marital Status:    Past Surgical History:  Procedure Laterality Date  . ABDOMINAL HYSTERECTOMY  1999  . bunions removed  1988  . C4 C5 cage insertion  06/28/2013  . CHOLECYSTECTOMY  04/25/2008  . EDG  12/17/2004  . ELECTROCARDIOGRAM  05/27/2007  . L5 S1 rod insertion  11/07/2012  . SKIN CANCER EXCISION    . SKIN CANCER EXCISION     several  . TONSILLECTOMY  1981  . TUBAL LIGATION  1997   Past Medical History:  Diagnosis Date  . Anemia   . ANXIETY 06/10/2007  . Cataract   . Chronic headaches   . Degenerative arthritis of spine    back and neck  . Depression   . DYSLIPIDEMIA 06/28/2008  . Esophageal stricture   . Fibromyalgia   . Gallstones   . GERD 06/10/2007  . Hiatal hernia   . 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 HYPERTENSION 06/10/2007  . Irritable bowel syndrome 06/28/2008  . OSTEOARTHRITIS 06/10/2007  . Skin cancer    basal cell  . Tubular adenoma of colon    BP 138/86   Pulse 85   Temp 98.3 F (36.8 C)   Ht 5\' 1"  (1.549 m)   Wt 172 lb (78 kg)   SpO2 95%   BMI 32.50 kg/m   Opioid Risk Score:   Fall Risk Score:  `1  Depression screen PHQ 2/9  Depression screen [facility] 2/9 01/18/2020 10/24/2019 10/18/2019 03/13/2019 02/07/2019 12/06/2018 09/22/2018  Decreased Interest 1 1 1 1 1 1 1   Down, Depressed, Hopeless 1 1 1 1 1 1 1   PHQ - 2 Score 2 2 2 2 2 2 2   Altered sleeping - 0 - - - - -  Tired, decreased energy - 3 - - - - -    Change in appetite - 3 - - - - -  Feeling bad or failure about yourself  - 0 - - - - -  Trouble concentrating - 0 - - - - -  Moving slowly or fidgety/restless - 0 - - - - -  Suicidal thoughts - 0 - - - - -  PHQ-9 Score -  8 - - - - -  Difficult doing work/chores - - - - - - -  Some recent data might be hidden   Review of Systems  Musculoskeletal: Positive for gait problem.  Neurological: Positive for dizziness, tremors, weakness and numbness.       Tingling and spasms   Psychiatric/Behavioral: Positive for dysphoric mood. The patient is nervous/anxious.        Objective:   Physical Exam Vitals and nursing note reviewed.  Constitutional:      Appearance: Normal appearance.  Cardiovascular:     Rate and Rhythm: Normal rate and regular rhythm.     Pulses: Normal pulses.     Heart sounds: Normal heart sounds.  Pulmonary:     Effort: Pulmonary effort is normal.     Breath sounds: Normal breath sounds.  Musculoskeletal:     Cervical back: Normal range of motion and neck supple.     Right lower leg: Edema present.     Left lower leg: Edema present.     Comments: Normal Muscle Bulk and Muscle Testing Reveals:  Upper Extremities: Full ROM and Muscle Strength 5/5  Lumbar Hypersensitivity Lower Extremities: Right: Decreased ROM and Muscle Strength 4/5 Right Lower Extremity Flexion Produces Pain into Her Right Patella Right Patella with Swelling Noted Left Lower Extremity: Full ROM and Muscle Strength 5/5 Arises from Chair slowly using [name] for support Antalgic  Gait   Skin:    General: Skin is warm and dry.  Neurological:     Mental Status: She is alert and oriented to person, place, and time.  Psychiatric:        Mood and Affect: Mood normal.        Behavior: Behavior normal.           Assessment & Plan:  1. History of chronic lumbar facet disease with spondylosis/DDD/ L4-5 spondylolisthesis.With L4-5 L5-S1 decompression  stabilization:03/19/2020 Refilled:Hydrocodone 10/325mg  one tablet every 6 hours as needed #120. We will continue the opioid monitoring program, this consists of regular clinic visits, examinations, urine drug screen, pill counts as well as use of [place] Controlled Substance Reporting System. 2. Depression with anxiety/ PTSD: Dr. Sima Matas Following.Continue Xanax,Abilifyand Effexor.03/19/2020 3. Cervical spondylosis: Stable at this time with no complaints. Continue Medication Regime.03/19/2020 4. 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 Continue with slow weaning of  Ritalin 10 mg, [name] reports she's on a daily dose. We will continue to Monitor. 03/19/2020. 5. Muscle Spasm: Continuecurrent medication regimen withFlexeril: May take 1-2 tablets at HS.03/19/2020 6. Left Lumbar Radiculitis:Continue to Monitor.03/19/2020 7. Left Knee Osteoarthritis:No complaints today.S/PLeft Knee Injectionon 01/15/2020with good relief noted.03/19/2020 8. Right ShoulderTendonitis:No complaints Today.Continue to Alternate Ice and Heat Therapy.ContinueHEPas tolerated. Continue to monitor.03/19/2020 9. Acute Right Knee Pain: S/P Fall: RX: Xray 10. Fall: Educated on [employer], she verbalizes understanding..   F/U in 1 month  59minutes of face to face patient care time was spent during this visit. All questions were encouraged and answered.

## 2020-03-21 ENCOUNTER — Telehealth: Payer: Self-pay | Admitting: Registered Nurse

## 2020-03-21 NOTE — Telephone Encounter (Signed)
Placed a call to ms. [place] regarding X-Ray results. No answer, left message to return the call.

## 2020-03-22 NOTE — Telephone Encounter (Signed)
Patient returned your call.

## 2020-03-26 NOTE — Telephone Encounter (Signed)
Return Ms. [place] call, no answer. Left message to return the call.

## 2020-04-18 ENCOUNTER — Encounter: Payer: Self-pay | Admitting: Registered Nurse

## 2020-04-18 ENCOUNTER — Encounter: Payer: BC Managed Care – PPO | Attending: Physical Medicine and Rehabilitation | Admitting: Registered Nurse

## 2020-04-18 ENCOUNTER — Other Ambulatory Visit: Payer: Self-pay

## 2020-04-18 VITALS — BP 144/85 | HR 88 | Temp 99.1°F | Ht 61.0 in | Wt 175.0 lb

## 2020-04-18 DIAGNOSIS — Z79891 Long term (current) use of opiate analgesic: Secondary | ICD-10-CM | POA: Insufficient documentation

## 2020-04-18 DIAGNOSIS — F0781 Postconcussional syndrome: Secondary | ICD-10-CM | POA: Diagnosis not present

## 2020-04-18 DIAGNOSIS — M961 Postlaminectomy syndrome, not elsewhere classified: Secondary | ICD-10-CM | POA: Diagnosis not present

## 2020-04-18 DIAGNOSIS — G894 Chronic pain syndrome: Secondary | ICD-10-CM | POA: Diagnosis not present

## 2020-04-18 DIAGNOSIS — M47816 Spondylosis without myelopathy or radiculopathy, lumbar region: Secondary | ICD-10-CM

## 2020-04-18 DIAGNOSIS — M5416 Radiculopathy, lumbar region: Secondary | ICD-10-CM | POA: Diagnosis not present

## 2020-04-18 DIAGNOSIS — M25561 Pain in right knee: Secondary | ICD-10-CM | POA: Insufficient documentation

## 2020-04-18 DIAGNOSIS — Z5181 Encounter for therapeutic drug level monitoring: Secondary | ICD-10-CM | POA: Insufficient documentation

## 2020-04-18 DIAGNOSIS — M47817 Spondylosis without myelopathy or radiculopathy, lumbosacral region: Secondary | ICD-10-CM | POA: Diagnosis not present

## 2020-04-18 DIAGNOSIS — M461 Sacroiliitis, not elsewhere classified: Secondary | ICD-10-CM | POA: Diagnosis not present

## 2020-04-18 DIAGNOSIS — M62838 Other muscle spasm: Secondary | ICD-10-CM

## 2020-04-18 DIAGNOSIS — F411 Generalized anxiety disorder: Secondary | ICD-10-CM | POA: Insufficient documentation

## 2020-04-18 MED ORDER — HYDROCODONE-ACETAMINOPHEN 10-325 MG PO TABS: 1.0000 | ORAL_TABLET | Freq: Four times a day (QID) | ORAL | 0 refills | Status: DC | PRN

## 2020-04-18 MED ORDER — METHYLPHENIDATE HCL 10 MG PO TABS: 10.0000 mg | ORAL_TABLET | Freq: Two times a day (BID) | ORAL | 0 refills | Status: DC

## 2020-04-18 NOTE — Progress Notes (Signed)
Subjective:    Patient ID: [name], female    DOB: [birth year], 64 y.o.   MRN: [id number]  HPI: [name] is a 64 y.o. female who returns for follow up appointment for chronic pain and medication refill. She states her pain is located in her lower back radiating into her left lower extremity. She rates her  Pain 9. Her current exercise regime is walking and performing stretching exercises.  [name] states with weaning of the Ritalin, she's having difficulty with concentrating and focusing, we will resume Ritalin. [name] instructed to send a My-Chart message in a week with an update, she verbalizes understanding. It will be our goal to slowly wean her off the Ritalin again, she verbalizes understanding.   [name] Morphine equivalent is 40.00  MME. She is also prescribed Alprazolam. We have discussed the black box warning of using opioids and benzodiazepines. I highlighted the dangers of using these drugs together and discussed the adverse events including respiratory suppression, overdose, cognitive impairment and importance of compliance with current regimen. We will continue to monitor and adjust as indicated.   Last Oral Swab was Performed on 01/18/2020, it was consistent.     Pain Inventory Average Pain 9 Pain Right Now 9 My pain is sharp, burning, dull, stabbing, tingling and aching  In the last 24 hours, has pain interfered with the following? General activity 10 Relation with others 10 Enjoyment of life 10 What TIME of day is your pain at its worst? all Sleep (in general) Fair  Pain is worse with: walking, sitting, inactivity, standing and unsure Pain improves with: rest, heat/ice, pacing activities and medication Relief from Meds: 9  Mobility walk with assistance use a cane how many minutes can you walk? vaies ability to climb steps?  yes do you drive?  yes Do you have any goals in this area?  yes  Function disabled: date disabled . I need  assistance with the following:  dressing, meal prep, household duties and shopping  Neuro/Psych bladder control problems weakness numbness tremor tingling trouble walking spasms dizziness anxiety  Prior Studies Any changes since last visit?  no  Physicians involved in your care Any changes since last visit?  no   Family History  Problem Relation Age of Onset  . Cervical cancer Mother   . Diabetes Mother   . Stroke Mother   . Alzheimer's disease Mother   . Heart attack Father   . Stroke Father   . Colon cancer Father   . Anxiety disorder Maternal Aunt   . Depression Maternal Aunt   . Anxiety disorder Maternal Uncle   . Depression Maternal Uncle        suicide attempt by gun shot wound to head. Survived  . Alcohol abuse Other   . Drug abuse Other   . Breast cancer Maternal Grandmother   . Diabetes Maternal Grandmother   . Breast cancer Paternal Aunt   . Colon polyps Sister        x 2   Social History   Socioeconomic History  . Marital status: Married    Spouse name: Not on file  . Number of children: 0  . Years of education: Not on file  . Highest education level: Not on file  Occupational History  . Occupation: disabled  Tobacco Use  . Smoking status: Never Smoker  . Smokeless tobacco: Never Used  Substance and Sexual Activity  . Alcohol use: No    Alcohol/week: 0.0 standard  drinks  . Drug use: No  . Sexual activity: Yes  Other Topics Concern  . Not on file  Social History Narrative   Pt has h.s. degree   Social Determinants of Health   Financial Resource Strain:   . Difficulty of Paying Living Expenses:   Food Insecurity:   . Worried About Charity fundraiser in the Last Year:   . Arboriculturist in the Last Year:   Transportation Needs:   . Film/video editor (Medical):   Marland Kitchen Lack of Transportation (Non-Medical):   Physical Activity:   . Days of Exercise per Week:   . Minutes of Exercise per Session:   Stress:   . Feeling of Stress :     Social Connections:   . Frequency of Communication with Friends and Family:   . Frequency of Social Gatherings with Friends and Family:   . Attends Religious Services:   . Active Member of Clubs or Organizations:   . Attends Archivist Meetings:   Marland Kitchen Marital Status:    Past Surgical History:  Procedure Laterality Date  . ABDOMINAL HYSTERECTOMY  1999  . bunions removed  1988  . C4 C5 cage insertion  06/28/2013  . CHOLECYSTECTOMY  04/25/2008  . EDG  12/17/2004  . ELECTROCARDIOGRAM  05/27/2007  . L5 S1 rod insertion  11/07/2012  . SKIN CANCER EXCISION    . SKIN CANCER EXCISION     several  . TONSILLECTOMY  1981  . TUBAL LIGATION  1997   Past Medical History:  Diagnosis Date  . Anemia   . ANXIETY 06/10/2007  . Cataract   . Chronic headaches   . Degenerative arthritis of spine    back and neck  . Depression   . DYSLIPIDEMIA 06/28/2008  . Esophageal stricture   . Fibromyalgia   . Gallstones   . GERD 06/10/2007  . Hiatal hernia   . 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 HYPERTENSION 06/10/2007  . Irritable bowel syndrome 06/28/2008  . OSTEOARTHRITIS 06/10/2007  . Skin cancer    basal cell  . Tubular adenoma of colon    BP (!) 144/85   Pulse 88   Temp 99.1 F (37.3 C)   Ht 5\' 1"  (1.549 m)   Wt 175 lb (79.4 kg)   SpO2 98%   BMI 33.07 kg/m   Opioid Risk Score:   Fall Risk Score:  `1  Depression screen PHQ 2/9  Depression screen [facility] 2/9 01/18/2020 10/24/2019 10/18/2019 03/13/2019 02/07/2019 12/06/2018 09/22/2018  Decreased Interest 1 1 1 1 1 1 1   Down, Depressed, Hopeless 1 1 1 1 1 1 1   PHQ - 2 Score 2 2 2 2 2 2 2   Altered sleeping - 0 - - - - -  Tired, decreased energy - 3 - - - - -  Change in appetite - 3 - - - - -  Feeling bad or failure about yourself  - 0 - - - - -  Trouble concentrating - 0 - - - - -  Moving slowly or fidgety/restless - 0 - - - - -  Suicidal thoughts - 0 - - - - -  PHQ-9 Score - 8 - - - - -  Difficult doing work/chores - - - - - - -  Some recent data might be  hidden   Review of Systems  Constitutional: Negative.   HENT: Negative.   Eyes: Negative.   Respiratory: Negative.   Gastrointestinal: Negative.   Endocrine: Negative.  Musculoskeletal: Positive for arthralgias, back pain, gait problem and myalgias.       Spasms   Skin: Negative.   Allergic/Immunologic: Negative.   Neurological: Positive for dizziness, tremors, weakness and numbness.       Tingling   Hematological: Negative.   Psychiatric/Behavioral: The patient is nervous/anxious.   All other systems reviewed and are negative.      Objective:   Physical Exam Vitals and nursing note reviewed.  Constitutional:      Appearance: Normal appearance.  Cardiovascular:     Rate and Rhythm: Normal rate and regular rhythm.     Pulses: Normal pulses.     Heart sounds: Normal heart sounds.  Pulmonary:     Effort: Pulmonary effort is normal.     Breath sounds: Normal breath sounds.  Musculoskeletal:     Cervical back: Normal range of motion and neck supple.     Comments: Normal Muscle Bulk and Muscle Testing Reveals:  Upper Extremities: Full ROM and Muscle Strength 5/5 Lumbar Paraspinal Tenderness : L-3- L-5 Lower Extremities: Full ROM and Muscle Strength 5/5  Arises from chair with ease Narrow Based Gait   Skin:    General: Skin is warm and dry.  Neurological:     Mental Status: She is alert and oriented to person, place, and time.  Psychiatric:        Mood and Affect: Mood normal.        Behavior: Behavior normal.           Assessment & Plan:  1. History of chronic lumbar facet disease with spondylosis/DDD/ L4-5 spondylolisthesis.With L4-5 L5-S1 decompression stabilization:04/18/2020 Refilled:Hydrocodone 10/325mg  one tablet every 6 hours as needed #120. We will continue the opioid monitoring program, this consists of regular clinic visits, examinations, urine drug screen, pill counts as well as use of [place] Controlled Substance Reporting System. 2.  Depression with anxiety/ PTSD: Dr. Sima Matas Following.Continue Xanax,Abilifyand Effexor.04/18/2020 3. Cervical spondylosis: Stable at this time with no complaints. Continue Medication Regime.04/18/2020 4. 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 We will resumeRitalin 10 mg  One tablet at Breakfast and Lunch. She will send a My-Chart message in a week to evaluate medication. She verbalizes understanding.  04/18/2020. 5. Muscle Spasm: Continuecurrent medication regimen withFlexeril: May take 1-2 tablets at HS.04/18/2020 6. Left Lumbar Radiculitis:Continue to Monitor.04/18/2020 7. Left Knee Osteoarthritis:No complaints today.S/PLeft Knee Injectionon 01/15/2020with good relief noted.04/18/2020 8. Right ShoulderTendonitis:No complaints Today.Continue to Alternate Ice and Heat Therapy.ContinueHEPas tolerated. Continue to monitor.04/18/2020  F/U in 1 month  4minutes of face to face patient care time was spent during this visit. All questions were encouraged and answered.

## 2020-04-24 ENCOUNTER — Other Ambulatory Visit: Payer: Self-pay

## 2020-04-24 ENCOUNTER — Ambulatory Visit (INDEPENDENT_AMBULATORY_CARE_PROVIDER_SITE_OTHER): Payer: BC Managed Care – PPO | Admitting: Family Medicine

## 2020-04-24 ENCOUNTER — Encounter: Payer: Self-pay | Admitting: Family Medicine

## 2020-04-24 VITALS — BP 140/90 | HR 93 | Temp 97.9°F | Ht 61.0 in | Wt 170.6 lb

## 2020-04-24 DIAGNOSIS — G894 Chronic pain syndrome: Secondary | ICD-10-CM

## 2020-04-24 DIAGNOSIS — M797 Fibromyalgia: Secondary | ICD-10-CM | POA: Diagnosis not present

## 2020-04-24 DIAGNOSIS — E782 Mixed hyperlipidemia: Secondary | ICD-10-CM | POA: Diagnosis not present

## 2020-04-24 DIAGNOSIS — E039 Hypothyroidism, unspecified: Secondary | ICD-10-CM | POA: Diagnosis not present

## 2020-04-24 DIAGNOSIS — I10 Essential (primary) hypertension: Secondary | ICD-10-CM | POA: Diagnosis not present

## 2020-04-24 DIAGNOSIS — Z0000 Encounter for general adult medical examination without abnormal findings: Secondary | ICD-10-CM

## 2020-04-24 DIAGNOSIS — G250 Essential tremor: Secondary | ICD-10-CM

## 2020-04-24 LAB — COMPREHENSIVE METABOLIC PANEL
ALT: 22 U/L (ref 0–35)
AST: 19 U/L (ref 0–37)
Albumin: 4.6 g/dL (ref 3.5–5.2)
Alkaline Phosphatase: 78 U/L (ref 39–117)
BUN: 13 mg/dL (ref 6–23)
CO2: 29 mEq/L (ref 19–32)
Calcium: 9.7 mg/dL (ref 8.4–10.5)
Chloride: 102 mEq/L (ref 96–112)
Creatinine, Ser: 0.78 mg/dL (ref 0.40–1.20)
GFR: 74.59 mL/min (ref >60.00)
Glucose, Bld: 98 mg/dL (ref 70–99)
Potassium: 4.7 mEq/L (ref 3.5–5.1)
Sodium: 141 mEq/L (ref 135–145)
Total Bilirubin: 0.6 mg/dL (ref 0.2–1.2)
Total Protein: 6.5 g/dL (ref 6.0–8.3)

## 2020-04-24 LAB — LIPID PANEL
Cholesterol: 257 mg/dL — ABNORMAL HIGH (ref 0–200)
HDL: 44.1 mg/dL (ref >39.00)
Total CHOL/HDL Ratio: 6
Triglycerides: 500 mg/dL — ABNORMAL HIGH (ref 0.0–149.0)

## 2020-04-24 LAB — TSH: TSH: 3.31 u[IU]/mL (ref 0.35–4.50)

## 2020-04-24 LAB — CBC WITH DIFFERENTIAL/PLATELET
Basophils Absolute: 0 10*3/uL (ref 0.0–0.1)
Basophils Relative: 0.8 % (ref 0.0–3.0)
Eosinophils Absolute: 0 10*3/uL (ref 0.0–0.7)
Eosinophils Relative: 0.6 % (ref 0.0–5.0)
HCT: 40.4 % (ref 36.0–46.0)
Hemoglobin: 13.8 g/dL (ref 12.0–15.0)
Lymphocytes Relative: 27.6 % (ref 12.0–46.0)
Lymphs Abs: 1.5 10*3/uL (ref 0.7–4.0)
MCHC: 34.1 g/dL (ref 30.0–36.0)
MCV: 82.8 fl (ref 78.0–100.0)
Monocytes Absolute: 0.3 10*3/uL (ref 0.1–1.0)
Monocytes Relative: 5.4 % (ref 3.0–12.0)
Neutro Abs: 3.6 10*3/uL (ref 1.4–7.7)
Neutrophils Relative %: 65.6 % (ref 43.0–77.0)
Platelets: 180 10*3/uL (ref 150.0–400.0)
RBC: 4.88 Mil/uL (ref 3.87–5.11)
RDW: 14.6 % (ref 11.5–15.5)
WBC: 5.5 10*3/uL (ref 4.0–10.5)

## 2020-04-24 LAB — LDL CHOLESTEROL, DIRECT: Direct LDL: 111 mg/dL

## 2020-04-24 MED ORDER — PROPRANOLOL HCL 40 MG PO TABS: 40.0000 mg | ORAL_TABLET | Freq: Two times a day (BID) | ORAL | 5 refills | Status: DC

## 2020-04-24 MED ORDER — SHINGRIX 50 MCG/0.5ML IM SUSR
0.5000 mL | Freq: Once | INTRAMUSCULAR | 0 refills | Status: AC
Start: 2020-04-24 — End: 2020-04-24

## 2020-04-24 NOTE — Progress Notes (Signed)
Subjective  Chief Complaint  Patient presents with  . Annual Exam    shaking on both legs and lt hand x 1 year     HPI: [name] is a 64 y.o. female who presents to [facility] today for a Female Wellness Visit. She also has the concerns and/or needs as listed above in the chief complaint. These will be addressed in addition to the Health Maintenance Visit.   Wellness Visit: annual visit with health maintenance review and exam without Pap (s/p hysterectomy)  Health maintenance: Mammogram is up-to-date.  Feeling well.  Due for Shingrix. Chronic disease f/u and/or acute problem visit: (deemed necessary to be done in addition to the wellness visit):  Hypertension: Reviewed monthly readings at pain management center and they are borderline high.  She is borderline high here as well.  No chest pain or shortness of breath.  Taking medication without difficulty.  We did lower the dose by stopping HCTZ at last visit she was only getting half of 12.5 mg daily.  Chronic pain, for myalgia and mood disorder well-controlled by pain management  Hypothyroidism without symptoms of high or low thyroid.  Compliant with medications.  Due for recheck  Hyperlipidemia: She had been on a statin and she is seeing her prior PCP, she then stopped it but now is back on it.  She is not certain about when she restarted.  I have never refilled it to my knowledge.  She reports that she is currently taking it.  No myalgias  Reports tremor in bilateral hands although worse on the left.  Ongoing for about a year.  Progressive and now noticeable when she is handing things to others or when trying to eat or drink.  She does not believe any family members have it.  No tremor at rest.  No gait disturbance.  Assessment  1. Annual physical exam   2. Essential hypertension   3. Fibromyalgia   4. Chronic pain syndrome   5. Acquired hypothyroidism   6. Mixed hyperlipidemia   7. Essential  tremor      Plan  Female Wellness Visit:  Age appropriate Health Maintenance and Prevention measures were discussed with patient. Included topics are cancer screening recommendations, ways to keep healthy (see AVS) including dietary and exercise recommendations, regular eye and dental care, use of seat belts, and avoidance of moderate alcohol use and tobacco use.   BMI: discussed patient's BMI and encouraged positive lifestyle modifications to help get to or maintain a target BMI.  HM needs and immunizations were addressed and ordered. See below for orders. See HM and immunization section for updates.  Printed Shingrix vaccination to take to pharmacy for administration  Routine labs and screening tests ordered including cmp, cbc and lipids where appropriate.  Discussed recommendations regarding Vit D and calcium supplementation (see AVS)  Chronic disease management visit and/or acute problem visit:  Hypertension: Borderline control.  Add beta-blocker to help lower blood pressure also for essential tremor.  Education given  Hyperlipidemia on statin.  Check LFTs and fasting lipids today.  Patient will clarify how long she is been taking it.  Chronic pain fibromyalgia mood disorder well-controlled.  She is on Abilify, monitor for diabetes.  Hypothyroidism has been stable.  Recheck today.  Follow up: 6 months for follow-up hypertension Orders Placed This Encounter  Procedures  . CBC with Differential/Platelet  . Comprehensive metabolic panel  . Lipid panel  . TSH   No orders of  the defined types were placed in this encounter.     Lifestyle: Body mass index is 32.23 kg/m. Wt Readings from Last 3 Encounters:  04/24/20 170 lb 9.6 oz (77.4 kg)  04/18/20 175 lb (79.4 kg)  03/19/20 172 lb (78 kg)     Patient Active Problem List   Diagnosis Date Noted  . Chronic narcotic use 09/20/2018    Priority: High  . Tubular adenoma of colon     Priority: High  . Fibromyalgia      Priority: High  . Chronic pain syndrome 04/27/2018    Priority: High  . MDD (major depressive disorder) 03/13/2014    Priority: High  . GAD (generalized anxiety disorder) 03/13/2014    Priority: High  . Hypothyroidism 08/05/2011    Priority: High  . Hyperlipidemia 06/28/2008    Priority: High    Qualifier: Diagnosis of  By: Reatha Armour, Lucy     . Essential hypertension 06/10/2007    Priority: High    Qualifier: Diagnosis of  By: Loanne Drilling MD, Jacelyn Pi    . Hypertensive retinopathy 10/23/2019    Priority: Medium  . Primary osteoarthritis of right knee 03/31/2016    Priority: Medium  . Osteopenia after menopause 08/17/2014    Priority: Medium    Dexa: [facility] T = -1.5 lowest. 2019   . Panic disorder without agoraphobia 07/18/2014    Priority: Medium  . PTSD (post-traumatic stress disorder) 03/13/2014    Priority: Medium  . Cervical spondylosis without myelopathy 12/02/2012    Priority: Medium  . Lumbosacral spondylosis without myelopathy 06/07/2012    Priority: Medium  . Irritable bowel syndrome 06/28/2008    Priority: Medium    Qualifier: Diagnosis of  By: Marca Ancona RMA, Lucy     . GERD 06/10/2007    Priority: Medium    Qualifier: Diagnosis of  By: Loanne Drilling MD, Jacelyn Pi    . Lumbar facet arthropathy 02/05/2012    Priority: Low   Health Maintenance  Topic Date Due  . INFLUENZA VACCINE  05/12/2020  . MAMMOGRAM  12/11/2020  . TETANUS/TDAP  08/03/2021  . DEXA SCAN  11/25/2022  . COLONOSCOPY  12/01/2023  . COVID-19 Vaccine  Completed  . Hepatitis C Screening  Completed  . HIV Screening  Completed  . PAP SMEAR-Modifier  Discontinued   Immunization History  Administered Date(s) Administered  . H1N1 09/17/2008  . Hep A / Hep B 09/17/2008  . Hepatitis B 10/03/2008, 12/14/2008  . Influenza Split 08/04/2011  . Influenza Whole 06/28/2008, 06/12/2009, 08/12/2010  . Influenza, Quadrivalent, Recombinant, Inj, Pf 08/04/2019  . Influenza,inj,Quad PF,6+ Mos  07/26/2013, 07/30/2014, 06/25/2015, 08/04/2016, 08/10/2017, 08/10/2018  . PFIZER SARS-COV-2 Vaccination 01/26/2020, 02/16/2020  . Pneumococcal Polysaccharide-23 06/28/2008  . Td 11/12/2001  . Tdap 08/04/2011  . Zoster 07/30/2014   We updated and reviewed the patient's past history in detail and it is documented below. Allergies: Patient is allergic to gabapentin, poison ivy extract [poison ivy extract], poison oak extract [poison oak extract], and poison sumac extract. 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 Past Surgical History Patient  has a past surgical history that includes Abdominal hysterectomy (1999); Tonsillectomy (1981); Tubal ligation (1997); bunions removed (1988); EDG (12/17/2004); electrocardiogram (05/27/2007); Skin cancer excision; Cholecystectomy (04/25/2008); C4 C5  cage insertion (06/28/2013); L5 S1 rod insertion (11/07/2012); and Skin cancer excision. Family History: Patient family history includes Alcohol abuse in an other family member; Alzheimer's disease in her mother; Anxiety disorder in her maternal aunt and maternal uncle; Breast cancer in her maternal grandmother and paternal aunt; Cervical cancer in her mother; Colon cancer in her father; Colon polyps in her sister; Depression in her maternal aunt and maternal uncle; Diabetes in her maternal grandmother and mother; Drug abuse in an other family member; Heart attack in her father; Stroke in her father and mother. Social History:  Patient  reports that she has never smoked. She has never used smokeless tobacco. She reports that she does not drink alcohol  and does not use drugs.  Review of Systems: Constitutional: negative for fever or malaise Ophthalmic: negative for photophobia, double vision or loss of vision Cardiovascular: negative for chest pain, dyspnea on exertion, or new LE swelling Respiratory: negative for SOB or persistent cough Gastrointestinal: negative for abdominal pain, change in bowel habits or melena Genitourinary: negative for dysuria or gross hematuria, no abnormal uterine bleeding or disharge Musculoskeletal: negative for new gait disturbance or muscular weakness Integumentary: negative for new or persistent rashes, no breast lumps Neurological: negative for TIA or stroke symptoms positive tremor Psychiatric: negative for SI or delusions Allergic/Immunologic: negative for hives  Patient Care Team    Relationship Specialty Notifications Start End  Leamon Arnt, MD PCP - General Family Medicine  09/20/18   Meredith Staggers, MD Consulting Physician Physical Medicine and Rehabilitation  07/26/13   Eustace Moore, MD Consulting Physician Neurosurgery  07/26/13   Rolm Bookbinder, MD Consulting Physician Dermatology  07/26/13   Tyson Dense, MD Consulting Physician Obstetrics and Gynecology  08/10/17   Monna Fam, MD Consulting Physician Ophthalmology  09/20/18   Laurence Spates, MD (Inactive) Consulting Physician Gastroenterology  09/20/18   Jerene Bears, MD Consulting Physician Gastroenterology  09/20/18   Laurence Spates, MD (Inactive) Consulting Physician Gastroenterology  10/26/18     Objective  Vitals: BP 140/90   Pulse 93   Temp 97.9 F (36.6 C) (Temporal)   Ht 5\' 1"  (1.549 m)   Wt 170 lb 9.6 oz (77.4 kg)   SpO2 99%   BMI 32.23 kg/m  General:  Well developed, well nourished, no acute distress  Psych:  Alert and orientedx3,normal mood and affect, very happy today HEENT:  Normocephalic, atraumatic, non-icteric sclera,  supple neck without adenopathy, mass or thyromegaly Cardiovascular:  Normal  S1, S2, RRR without gallop, rub or murmur Respiratory:  Good breath sounds bilaterally, CTAB with normal respiratory effort Gastrointestinal: normal bowel sounds, soft, non-tender, no noted masses. No HSM MSK: no deformities, contusions. Joints are without erythema or swelling.  Skin:  Warm, no rashes or suspicious lesions noted Neurologic:    Mental status is normal. CN 2-11 are normal. Gross motor and sensory exams are normal. Normal gait.  Positive tremor left greater than right.  Normal finger-to-nose without ataxia or intention tremor    Commons side effects, risks, benefits, and alternatives for medications and treatment plan prescribed today were discussed, and the patient expressed understanding of the given instructions. Patient is instructed to call or message via MyChart if he/she has any questions or concerns regarding our treatment plan. No barriers to understanding were identified. We discussed Red Flag symptoms and signs in detail. Patient expressed understanding regarding what to do in case of urgent or emergency type symptoms.   Medication list was reconciled, printed  and provided to the patient in AVS. Patient instructions and summary information was reviewed with the patient as documented in the AVS. This note was prepared with assistance of Dragon voice recognition software. Occasional wrong-word or sound-a-like substitutions may have occurred due to the inherent limitations of voice recognition software  This visit occurred during the SARS-CoV-2 public health emergency.  Safety protocols were in place, including screening questions prior to the visit, additional usage of staff PPE, and extensive cleaning of exam room while observing appropriate contact time as indicated for disinfecting solutions.

## 2020-04-24 NOTE — Patient Instructions (Addendum)
Please return in 6 months for hypertension follow up.  I will release your lab results to you on your MyChart account with further instructions. Please reply with any questions.   Please check and send me a message about how long you have been on the atorvastatin. Start the new medication for the tremor.  Let me know if it is not helping.   Please take the prescription for Shingrix to the pharmacy so they may administer the vaccinations. Your insurance will then cover the injections.   If you have any questions or concerns, please don't hesitate to send me a message via MyChart or call the office at [phone]. Thank you for visiting with [place] today! It's our pleasure caring for you.    Preventive Care 63-44 Years Old, Female Preventive care refers to visits with your health care provider and lifestyle choices that can promote health and wellness. This includes:  A yearly physical exam. This may also be called an annual well check.  Regular dental visits and eye exams.  Immunizations.  Screening for certain conditions.  Healthy lifestyle choices, such as eating a healthy diet, getting regular exercise, not using drugs or products that contain nicotine and tobacco, and limiting alcohol use. What can I expect for my preventive care visit? Physical exam Your health care provider will check your:  Height and weight. This may be used to calculate body mass index (BMI), which tells if you are at a healthy weight.  Heart rate and blood pressure.  Skin for abnormal spots. Counseling Your health care provider may ask you questions about your:  Alcohol, tobacco, and drug use.  Emotional well-being.  Home and relationship well-being.  Sexual activity.  Eating habits.  Work and work Statistician.  Method of birth control.  Menstrual cycle.  Pregnancy history. What immunizations do I need?  Influenza (flu) vaccine  This is recommended every year. Tetanus, diphtheria, and  pertussis (Tdap) vaccine  You may need a Td booster every 10 years. Varicella (chickenpox) vaccine  You may need this if you have not been vaccinated. Zoster (shingles) vaccine  You may need this after age 37. Measles, mumps, and rubella (MMR) vaccine  You may need at least one dose of MMR if you were born in 1957 or later. You may also need a second dose. Pneumococcal conjugate (PCV13) vaccine  You may need this if you have certain conditions and were not previously vaccinated. Pneumococcal polysaccharide (PPSV23) vaccine  You may need one or two doses if you smoke cigarettes or if you have certain conditions. Meningococcal conjugate (MenACWY) vaccine  You may need this if you have certain conditions. Hepatitis A vaccine  You may need this if you have certain conditions or if you travel or work in places where you may be exposed to hepatitis A. Hepatitis B vaccine  You may need this if you have certain conditions or if you travel or work in places where you may be exposed to hepatitis B. Haemophilus influenzae type b (Hib) vaccine  You may need this if you have certain conditions. Human papillomavirus (HPV) vaccine  If recommended by your health care provider, you may need three doses over 6 months. You may receive vaccines as individual doses or as more than one vaccine together in one shot (combination vaccines). Talk with your health care provider about the risks and benefits of combination vaccines. What tests do I need? Blood tests  Lipid and cholesterol levels. These may be checked every 5 years, or  more frequently if you are over 8 years old.  Hepatitis C test.  Hepatitis B test. Screening  Lung cancer screening. You may have this screening every year starting at age 58 if you have a 30-pack-year history of smoking and currently smoke or have quit within the past 15 years.  Colorectal cancer screening. All adults should have this screening starting at age 13 and  continuing until age 61. Your health care provider may recommend screening at age 65 if you are at increased risk. You will have tests every 1-10 years, depending on your results and the type of screening test.  Diabetes screening. This is done by checking your blood sugar (glucose) after you have not eaten for a while (fasting). You may have this done every 1-3 years.  Mammogram. This may be done every 1-2 years. Talk with your health care provider about when you should start having regular mammograms. This may depend on whether you have a family history of breast cancer.  BRCA-related cancer screening. This may be done if you have a family history of breast, ovarian, tubal, or peritoneal cancers.  Pelvic exam and Pap test. This may be done every 3 years starting at age 6. Starting at age 42, this may be done every 5 years if you have a Pap test in combination with an HPV test. Other tests  Sexually transmitted disease (STD) testing.  Bone density scan. This is done to screen for osteoporosis. You may have this scan if you are at high risk for osteoporosis. Follow these instructions at home: Eating and drinking  Eat a diet that includes fresh fruits and vegetables, whole grains, lean protein, and low-fat dairy.  Take vitamin and mineral supplements as recommended by your health care provider.  Do not drink alcohol if: ? Your health care provider tells you not to drink. ? You are pregnant, may be pregnant, or are planning to become pregnant.  If you drink alcohol: ? Limit how much you have to 0-1 drink a day. ? Be aware of how much alcohol is in your drink. In the [place] one drink equals one 12 oz bottle of beer (355 mL), one 5 oz glass of wine (148 mL), or one 1 oz glass of hard liquor (44 mL). Lifestyle  Take daily care of your teeth and gums.  Stay active. Exercise for at least 30 minutes on 5 or more days each week.  Do not use any products that contain nicotine or tobacco,  such as cigarettes, e-cigarettes, and chewing tobacco. If you need help quitting, ask your health care provider.  If you are sexually active, practice safe sex. Use a condom or other form of birth control (contraception) in order to prevent pregnancy and STIs (sexually transmitted infections).  If told by your health care provider, take low-dose aspirin daily starting at age 50. What's next?  Visit your health care provider once a year for a well check visit.  Ask your health care provider how often you should have your eyes and teeth checked.  Stay up to date on all vaccines. This information is not intended to replace advice given to you by your health care provider. Make sure you discuss any questions you have with your health care provider. Document Revised: 06/09/2018 Document Reviewed: 06/09/2018 Elsevier Patient Education  2020 [employer].

## 2020-04-25 ENCOUNTER — Telehealth: Payer: Self-pay | Admitting: Family Medicine

## 2020-04-25 NOTE — Telephone Encounter (Signed)
Patient given lab results. 

## 2020-04-25 NOTE — Telephone Encounter (Signed)
Pt returned your call regarding lab results.  °

## 2020-04-30 ENCOUNTER — Encounter: Payer: Self-pay | Admitting: Family Medicine

## 2020-05-14 ENCOUNTER — Other Ambulatory Visit: Payer: Self-pay

## 2020-05-14 ENCOUNTER — Encounter: Payer: Self-pay | Admitting: Registered Nurse

## 2020-05-14 ENCOUNTER — Other Ambulatory Visit: Payer: Self-pay | Admitting: Registered Nurse

## 2020-05-14 ENCOUNTER — Encounter: Payer: BC Managed Care – PPO | Attending: Physical Medicine and Rehabilitation | Admitting: Registered Nurse

## 2020-05-14 VITALS — BP 138/82 | HR 72 | Temp 98.7°F | Ht 61.0 in | Wt 170.0 lb

## 2020-05-14 DIAGNOSIS — M461 Sacroiliitis, not elsewhere classified: Secondary | ICD-10-CM | POA: Insufficient documentation

## 2020-05-14 DIAGNOSIS — M797 Fibromyalgia: Secondary | ICD-10-CM

## 2020-05-14 DIAGNOSIS — Z79899 Other long term (current) drug therapy: Secondary | ICD-10-CM | POA: Diagnosis not present

## 2020-05-14 DIAGNOSIS — M5416 Radiculopathy, lumbar region: Secondary | ICD-10-CM

## 2020-05-14 DIAGNOSIS — Z79891 Long term (current) use of opiate analgesic: Secondary | ICD-10-CM | POA: Insufficient documentation

## 2020-05-14 DIAGNOSIS — M25561 Pain in right knee: Secondary | ICD-10-CM | POA: Insufficient documentation

## 2020-05-14 DIAGNOSIS — M47816 Spondylosis without myelopathy or radiculopathy, lumbar region: Secondary | ICD-10-CM

## 2020-05-14 DIAGNOSIS — M62838 Other muscle spasm: Secondary | ICD-10-CM

## 2020-05-14 DIAGNOSIS — F411 Generalized anxiety disorder: Secondary | ICD-10-CM | POA: Insufficient documentation

## 2020-05-14 DIAGNOSIS — Z5181 Encounter for therapeutic drug level monitoring: Secondary | ICD-10-CM | POA: Insufficient documentation

## 2020-05-14 DIAGNOSIS — M961 Postlaminectomy syndrome, not elsewhere classified: Secondary | ICD-10-CM | POA: Diagnosis not present

## 2020-05-14 DIAGNOSIS — G894 Chronic pain syndrome: Secondary | ICD-10-CM

## 2020-05-14 DIAGNOSIS — F0781 Postconcussional syndrome: Secondary | ICD-10-CM | POA: Insufficient documentation

## 2020-05-14 DIAGNOSIS — M47817 Spondylosis without myelopathy or radiculopathy, lumbosacral region: Secondary | ICD-10-CM | POA: Insufficient documentation

## 2020-05-14 MED ORDER — HYDROCODONE-ACETAMINOPHEN 10-325 MG PO TABS: 1.0000 | ORAL_TABLET | Freq: Four times a day (QID) | ORAL | 0 refills | Status: DC | PRN

## 2020-05-14 MED ORDER — METHYLPHENIDATE HCL 10 MG PO TABS: 10.0000 mg | ORAL_TABLET | Freq: Two times a day (BID) | ORAL | 0 refills | Status: DC

## 2020-05-14 NOTE — Progress Notes (Signed)
Subjective:    Patient ID: [name], female    DOB: [birth year], 64 y.o.   MRN: [id number]  HPI: [name] is a 64 y.o. female who returns for follow up appointment for chronic pain and medication refill. She states her pain is located in her lower back radiating into her right lower extremity. She rates her pain 9. Hercurrent exercise regime is walking and performing stretching exercises.  [name] states she has notice significant improvement in her mood, energy level and  Lifestyle, with resuming the Ritalin, we will continue to monitor and continue the slow weaning of Ritalin, she verbalizes understanding.   [name] Morphine equivalent is 40.00 MME. She is also prescribed Alprazolam. We have discussed the black box warning of using opioids and benzodiazepines. I highlighted the dangers of using these drugs together and discussed the adverse events including respiratory suppression, overdose, cognitive impairment and importance of compliance with current regimen. We will continue to monitor and adjust as indicated.   Oral Swab was Performed today.    Pain Inventory Average Pain 9 Pain Right Now 9 My pain is constant, sharp, burning, dull, stabbing, tingling and aching  In the last 24 hours, has pain interfered with the following? General activity 10 Relation with others 10 Enjoyment of life 10 What TIME of day is your pain at its worst? Pain all the time. Around the clock pain. Sleep (in general) Good  Pain is worse with: walking, bending, inactivity, standing and some activites Pain improves with: rest, heat/ice, pacing activities, medication and Prayer Relief from Meds: 8  Mobility use a cane how many minutes can you walk? 60 mins ability to climb steps?  yes do you drive?  yes Do you have any goals in this area?  yes  Function disabled: date disabled 2010 I need assistance with the following:  meal prep, household duties and shopping Do you have  any goals in this area?  yes  Neuro/Psych weakness numbness tremor tingling confusion depression  Prior Studies Any changes since last visit?  no  Physicians involved in your care Any changes since last visit?  no   Family History  Problem Relation Age of Onset  . Cervical cancer Mother   . Diabetes Mother   . Stroke Mother   . Alzheimer's disease Mother   . Heart attack Father   . Stroke Father   . Colon cancer Father   . Anxiety disorder Maternal Aunt   . Depression Maternal Aunt   . Anxiety disorder Maternal Uncle   . Depression Maternal Uncle        suicide attempt by gun shot wound to head. Survived  . Alcohol abuse Other   . Drug abuse Other   . Breast cancer Maternal Grandmother   . Diabetes Maternal Grandmother   . Breast cancer Paternal Aunt   . Colon polyps Sister        x 2   Social History   Socioeconomic History  . Marital status: Married    Spouse name: Not on file  . Number of children: 0  . Years of education: Not on file  . Highest education level: Not on file  Occupational History  . Occupation: disabled  Tobacco Use  . Smoking status: Never Smoker  . Smokeless tobacco: Never Used  Substance and Sexual Activity  . Alcohol use: No    Alcohol/week: 0.0 standard drinks  . Drug use: No  . Sexual activity: Yes  Other Topics Concern  .  Not on file  Social History Narrative   Pt has h.s. degree   Social Determinants of Health   Financial Resource Strain:   . Difficulty of Paying Living Expenses:   Food Insecurity:   . Worried About Charity fundraiser in the Last Year:   . Arboriculturist in the Last Year:   Transportation Needs:   . Film/video editor (Medical):   Marland Kitchen Lack of Transportation (Non-Medical):   Physical Activity:   . Days of Exercise per Week:   . Minutes of Exercise per Session:   Stress:   . Feeling of Stress :   Social Connections:   . Frequency of Communication with Friends and Family:   . Frequency of  Social Gatherings with Friends and Family:   . Attends Religious Services:   . Active Member of Clubs or Organizations:   . Attends Archivist Meetings:   Marland Kitchen Marital Status:    Past Surgical History:  Procedure Laterality Date  . ABDOMINAL HYSTERECTOMY  1999  . bunions removed  1988  . C4 C5 cage insertion  06/28/2013  . CHOLECYSTECTOMY  04/25/2008  . EDG  12/17/2004  . ELECTROCARDIOGRAM  05/27/2007  . L5 S1 rod insertion  11/07/2012  . SKIN CANCER EXCISION    . SKIN CANCER EXCISION     several  . TONSILLECTOMY  1981  . TUBAL LIGATION  1997   Past Medical History:  Diagnosis Date  . Anemia   . ANXIETY 06/10/2007  . Cataract   . Chronic headaches   . Degenerative arthritis of spine    back and neck  . Depression   . DYSLIPIDEMIA 06/28/2008  . Esophageal stricture   . Fibromyalgia   . Gallstones   . GERD 06/10/2007  . Hiatal hernia   . 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 HYPERTENSION 06/10/2007  . Irritable bowel syndrome 06/28/2008  . OSTEOARTHRITIS 06/10/2007  . Skin cancer    basal cell  . Tubular adenoma of colon    BP 138/82   Pulse 72   Temp 98.7 F (37.1 C)   Ht 5\' 1"  (1.549 m)   Wt 170 lb (77.1 kg)   SpO2 95%   BMI 32.12 kg/m   Opioid Risk Score:   Fall Risk Score:  `1  Depression screen PHQ 2/9  Depression screen [facility] 2/9 01/18/2020 10/24/2019 10/18/2019 03/13/2019 02/07/2019 12/06/2018 09/22/2018  Decreased Interest 1 1 1 1 1 1 1   Down, Depressed, Hopeless 1 1 1 1 1 1 1   PHQ - 2 Score 2 2 2 2 2 2 2   Altered sleeping - 0 - - - - -  Tired, decreased energy - 3 - - - - -  Change in appetite - 3 - - - - -  Feeling bad or failure about yourself  - 0 - - - - -  Trouble concentrating - 0 - - - - -  Moving slowly or fidgety/restless - 0 - - - - -  Suicidal thoughts - 0 - - - - -  PHQ-9 Score - 8 - - - - -  Difficult doing work/chores - - - - - - -  Some recent data might be hidden   Review of Systems  Constitutional: Negative.   HENT: Negative.   Eyes: Negative.     Respiratory: Negative.   Cardiovascular: Negative.   Endocrine: Negative.   Genitourinary: Negative.   Musculoskeletal: Positive for back pain, gait problem and neck pain.  Skin: Negative.  Allergic/Immunologic: Negative.   Neurological: Positive for dizziness and tremors.  Hematological: Negative.   Psychiatric/Behavioral: Positive for confusion.       Objective:   Physical Exam Vitals and nursing note reviewed.  Constitutional:      Appearance: Normal appearance.  Cardiovascular:     Rate and Rhythm: Normal rate and regular rhythm.     Pulses: Normal pulses.     Heart sounds: Normal heart sounds.  Pulmonary:     Effort: Pulmonary effort is normal.     Breath sounds: Normal breath sounds.  Musculoskeletal:     Cervical back: Normal range of motion and neck supple.     Comments: Normal Muscle Bulk and Muscle Testing Reveals:  Upper Extremities: Full ROM and Muscle Strength 5/5 Lumbar Paraspinal Tenderness: L-3-L-5 Lower Extremities: Full ROM and Muscle Strength 5/5 Arises from chair with ease Narrow Based  Gait   Skin:    General: Skin is warm and dry.  Neurological:     Mental Status: She is alert and oriented to person, place, and time.  Psychiatric:        Mood and Affect: Mood normal.        Behavior: Behavior normal.           Assessment & Plan:  1. History of chronic lumbar facet disease with spondylosis/DDD/ L4-5 spondylolisthesis.With L4-5 L5-S1 decompression stabilization:05/14/2020 Refilled:Hydrocodone 10/325mg  one tablet every 6 hours as needed #120. We will continue the opioid monitoring program, this consists of regular clinic visits, examinations, urine drug screen, pill counts as well as use of [place] Controlled Substance Reporting System. 2. Depression with anxiety/ PTSD: Dr. Sima Matas Following.Continue Xanax,Abilifyand Effexor.05/14/2020 3. Cervical spondylosis: Stable at this time with no complaints. Continue Medication  Regime.05/14/2020 4. 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 Ritalin: Resumed: RX: Ritalin10 mg  One tablet at Breakfast and Lunch. #40. Continue slow weaning.  05/14/2020. 5. Muscle Spasm: Continuecurrent medication regimen withFlexeril: May take 1-2 tablets at HS.05/14/2020 6. Left Lumbar Radiculitis:Continue HEP as Tolerated . Continue to Monitor.05/14/2020 7. Left Knee Osteoarthritis:No complaints today.S/PLeft Knee Injectionon 01/15/2020with good relief noted.05/14/2020   F/U in 1 month  78minutes of face to face patient care time was spent during this visit. All questions were encouraged and answered.

## 2020-05-20 LAB — DRUG TOX ALC METAB W/CON, ORAL FLD: Alcohol Metabolite: NEGATIVE ng/mL (ref <25)

## 2020-05-20 LAB — DRUG TOX MONITOR 1 W/CONF, ORAL FLD

## 2020-05-20 LAB — DRUG TOX METHYLPHEN W/CONF,ORAL FLD

## 2020-05-29 ENCOUNTER — Telehealth: Payer: Self-pay | Admitting: *Deleted

## 2020-05-29 NOTE — Telephone Encounter (Signed)
Oral swab was insufficient quantity to report results.

## 2020-06-04 DIAGNOSIS — H524 Presbyopia: Secondary | ICD-10-CM | POA: Diagnosis not present

## 2020-06-04 DIAGNOSIS — H26491 Other secondary cataract, right eye: Secondary | ICD-10-CM | POA: Diagnosis not present

## 2020-06-04 DIAGNOSIS — H04123 Dry eye syndrome of bilateral lacrimal glands: Secondary | ICD-10-CM | POA: Diagnosis not present

## 2020-06-04 DIAGNOSIS — Z961 Presence of intraocular lens: Secondary | ICD-10-CM | POA: Diagnosis not present

## 2020-06-04 DIAGNOSIS — H35373 Puckering of macula, bilateral: Secondary | ICD-10-CM | POA: Diagnosis not present

## 2020-06-10 ENCOUNTER — Encounter: Payer: Self-pay | Admitting: Family Medicine

## 2020-06-13 ENCOUNTER — Encounter: Payer: Self-pay | Admitting: Registered Nurse

## 2020-06-13 ENCOUNTER — Other Ambulatory Visit: Payer: Self-pay

## 2020-06-13 ENCOUNTER — Encounter: Payer: BC Managed Care – PPO | Attending: Physical Medicine and Rehabilitation | Admitting: Registered Nurse

## 2020-06-13 VITALS — BP 125/55 | HR 62 | Temp 98.4°F | Ht 61.0 in | Wt 171.0 lb

## 2020-06-13 DIAGNOSIS — M5416 Radiculopathy, lumbar region: Secondary | ICD-10-CM | POA: Diagnosis not present

## 2020-06-13 DIAGNOSIS — Z79899 Other long term (current) drug therapy: Secondary | ICD-10-CM | POA: Diagnosis not present

## 2020-06-13 DIAGNOSIS — M47817 Spondylosis without myelopathy or radiculopathy, lumbosacral region: Secondary | ICD-10-CM

## 2020-06-13 DIAGNOSIS — M62838 Other muscle spasm: Secondary | ICD-10-CM

## 2020-06-13 DIAGNOSIS — M25561 Pain in right knee: Secondary | ICD-10-CM | POA: Diagnosis not present

## 2020-06-13 DIAGNOSIS — Z79891 Long term (current) use of opiate analgesic: Secondary | ICD-10-CM | POA: Insufficient documentation

## 2020-06-13 DIAGNOSIS — M461 Sacroiliitis, not elsewhere classified: Secondary | ICD-10-CM | POA: Insufficient documentation

## 2020-06-13 DIAGNOSIS — G894 Chronic pain syndrome: Secondary | ICD-10-CM | POA: Diagnosis not present

## 2020-06-13 DIAGNOSIS — F0781 Postconcussional syndrome: Secondary | ICD-10-CM | POA: Diagnosis not present

## 2020-06-13 DIAGNOSIS — Z5181 Encounter for therapeutic drug level monitoring: Secondary | ICD-10-CM | POA: Insufficient documentation

## 2020-06-13 DIAGNOSIS — F411 Generalized anxiety disorder: Secondary | ICD-10-CM | POA: Insufficient documentation

## 2020-06-13 DIAGNOSIS — M961 Postlaminectomy syndrome, not elsewhere classified: Secondary | ICD-10-CM | POA: Diagnosis not present

## 2020-06-13 DIAGNOSIS — M47816 Spondylosis without myelopathy or radiculopathy, lumbar region: Secondary | ICD-10-CM

## 2020-06-13 DIAGNOSIS — M797 Fibromyalgia: Secondary | ICD-10-CM

## 2020-06-13 MED ORDER — ALPRAZOLAM 1 MG PO TABS
ORAL_TABLET | ORAL | 3 refills | Status: DC
Start: 2020-06-13 — End: 2020-10-15

## 2020-06-13 MED ORDER — METHYLPHENIDATE HCL 10 MG PO TABS: 10.0000 mg | ORAL_TABLET | Freq: Two times a day (BID) | ORAL | 0 refills | Status: DC

## 2020-06-13 MED ORDER — HYDROCODONE-ACETAMINOPHEN 10-325 MG PO TABS: 1.0000 | ORAL_TABLET | Freq: Four times a day (QID) | ORAL | 0 refills | Status: DC | PRN

## 2020-06-13 NOTE — Progress Notes (Signed)
Subjective:     Patient ID: [name], female   DOB: 1956-03-07, 64 y.o.   MRN: [id number]  HPI: [name] is a 64 y.o. female who returns for follow up appointment for chronic pain and medication refill. She states her pain is located in her lower back radiating into her left lower extremity. She rates her pain 8. Her current exercise regime is walking and performing stretching exercises.  [name] Morphine equivalent is 40.00 MME.She is also prescribed Alprazolam. We have discussed the black box warning of using opioids and benzodiazepines. I highlighted the dangers of using these drugs together and discussed the adverse events including respiratory suppression, overdose, cognitive impairment and importance of compliance with current regimen. We will continue to monitor and adjust as indicated.   Last Oral Swab was Performed on 01/18/2020, it was consistent.    Pain Inventory Average Pain 9 Pain Right Now 8 My pain is sharp, burning, dull, stabbing, tingling and aching  In the last 24 hours, has pain interfered with the following? General activity 10 Relation with others 10 Enjoyment of life 10 What TIME of day is your pain at its worst? morning , daytime, evening and night Sleep (in general) Fair  Pain is worse with: walking, bending, sitting, inactivity, standing, unsure and some activites Pain improves with: rest, heat/ice, pacing activities, medication and injections Relief from Meds:   Family History  Problem Relation Age of Onset  . Cervical cancer Mother   . Diabetes Mother   . Stroke Mother   . Alzheimer's disease Mother   . Heart attack Father   . Stroke Father   . Colon cancer Father   . Anxiety disorder Maternal Aunt   . Depression Maternal Aunt   . Anxiety disorder Maternal Uncle   . Depression Maternal Uncle        suicide attempt by gun shot wound to head. Survived  . Alcohol abuse Other   . Drug abuse Other   . Breast cancer Maternal  Grandmother   . Diabetes Maternal Grandmother   . Breast cancer Paternal Aunt   . Colon polyps Sister        x 2   Social History   Socioeconomic History  . Marital status: Married    Spouse name: Not on file  . Number of children: 0  . Years of education: Not on file  . Highest education level: Not on file  Occupational History  . Occupation: disabled  Tobacco Use  . Smoking status: Never Smoker  . Smokeless tobacco: Never Used  Substance and Sexual Activity  . Alcohol use: No    Alcohol/week: 0.0 standard drinks  . Drug use: No  . Sexual activity: Yes  Other Topics Concern  . Not on file  Social History Narrative   Pt has h.s. degree   Social Determinants of Health   Financial Resource Strain:   . Difficulty of Paying Living Expenses: Not on file  Food Insecurity:   . Worried About Charity fundraiser in the Last Year: Not on file  . Ran Out of Food in the Last Year: Not on file  Transportation Needs:   . Lack of Transportation (Medical): Not on file  . Lack of Transportation (Non-Medical): Not on file  Physical Activity:   . Days of Exercise per Week: Not on file  . Minutes of Exercise per Session: Not on file  Stress:   . Feeling of Stress : Not on file  Social  Connections:   . Frequency of Communication with Friends and Family: Not on file  . Frequency of Social Gatherings with Friends and Family: Not on file  . Attends Religious Services: Not on file  . Active Member of Clubs or Organizations: Not on file  . Attends Archivist Meetings: Not on file  . Marital Status: Not on file   Past Surgical History:  Procedure Laterality Date  . ABDOMINAL HYSTERECTOMY  1999  . bunions removed  1988  . C4 C5 cage insertion  06/28/2013  . CHOLECYSTECTOMY  04/25/2008  . EDG  12/17/2004  . ELECTROCARDIOGRAM  05/27/2007  . L5 S1 rod insertion  11/07/2012  . SKIN CANCER EXCISION    . SKIN CANCER EXCISION     several  . TONSILLECTOMY  1981  . TUBAL LIGATION   1997   Past Surgical History:  Procedure Laterality Date  . ABDOMINAL HYSTERECTOMY  1999  . bunions removed  1988  . C4 C5 cage insertion  06/28/2013  . CHOLECYSTECTOMY  04/25/2008  . EDG  12/17/2004  . ELECTROCARDIOGRAM  05/27/2007  . L5 S1 rod insertion  11/07/2012  . SKIN CANCER EXCISION    . SKIN CANCER EXCISION     several  . TONSILLECTOMY  1981  . TUBAL LIGATION  1997   Past Medical History:  Diagnosis Date  . Anemia   . ANXIETY 06/10/2007  . Cataract   . Chronic headaches   . Degenerative arthritis of spine    back and neck  . Depression   . DYSLIPIDEMIA 06/28/2008  . Esophageal stricture   . Fibromyalgia   . Gallstones   . GERD 06/10/2007  . Hiatal hernia   . 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 HYPERTENSION 06/10/2007  . Irritable bowel syndrome 06/28/2008  . OSTEOARTHRITIS 06/10/2007  . Skin cancer    basal cell  . Tubular adenoma of colon    BP (!) 125/55   Pulse 62   Temp 98.4 F (36.9 C)   Ht 5\' 1"  (1.549 m)   Wt 171 lb (77.6 kg)   SpO2 98%   BMI 32.31 kg/m   Opioid Risk Score:   Fall Risk Score:  `1  Depression screen PHQ 2/9  Depression screen [facility] 2/9 05/14/2020 01/18/2020 10/24/2019 10/18/2019 03/13/2019 02/07/2019 12/06/2018  Decreased Interest 0 1 1 1 1 1 1   Down, Depressed, Hopeless 0 1 1 1 1 1 1   PHQ - 2 Score 0 2 2 2 2 2 2   Altered sleeping - - 0 - - - -  Tired, decreased energy - - 3 - - - -  Change in appetite - - 3 - - - -  Feeling bad or failure about yourself  - - 0 - - - -  Trouble concentrating - - 0 - - - -  Moving slowly or fidgety/restless - - 0 - - - -  Suicidal thoughts - - 0 - - - -  PHQ-9 Score - - 8 - - - -  Difficult doing work/chores - - - - - - -  Some recent data might be hidden    Review of Systems  HENT: Negative.   Eyes: Negative.   Respiratory: Negative.   Cardiovascular: Negative.   Gastrointestinal: Negative.   Endocrine: Negative.   Genitourinary: Negative.   Musculoskeletal: Positive for back pain.       Spasms   Skin: Negative.    Allergic/Immunologic: Negative.   Neurological: Positive for weakness and numbness.  Tingling   Hematological: Negative.   Psychiatric/Behavioral: Negative.   All other systems reviewed and are negative.      Objective:   Physical Exam Vitals and nursing note reviewed.  Constitutional:      Appearance: Normal appearance.  Cardiovascular:     Rate and Rhythm: Normal rate and regular rhythm.     Pulses: Normal pulses.     Heart sounds: Normal heart sounds.  Pulmonary:     Effort: Pulmonary effort is normal.     Breath sounds: Normal breath sounds.  Musculoskeletal:     Cervical back: Normal range of motion and neck supple.     Comments: Normal Muscle Bulk and Muscle Testing Reveals:  Upper Extremities: Full ROM and Muscle Strength 5/5 Lumbar Paraspinal Tenderness: L-3-L-5 Lower Extremities: Full ROM and Muscle Strength 5/5 Arises from chair with ease Narrow Based  Gait   Skin:    General: Skin is warm and dry.  Neurological:     Mental Status: She is alert and oriented to person, place, and time.  Psychiatric:        Mood and Affect: Mood normal.        Behavior: Behavior normal.        Assessment:Plan  1. History of chronic lumbar facet disease with spondylosis/DDD/ L4-5 spondylolisthesis.With L4-5 L5-S1 decompression stabilization:06/14/2020 Refilled:Hydrocodone 10/325mg  one tablet every 6 hours as needed #120. We will continue the opioid monitoring program, this consists of regular clinic visits, examinations, urine drug screen, pill counts as well as use of [place] Controlled Substance Reporting System. 2. Depression with anxiety/ PTSD: Dr. Sima Matas Following.Continue Xanax,Abilifyand Effexor.06/14/2020 3. Cervical spondylosis: Stable at this time with no complaints. Continue Medication Regime.06/14/2020 4. 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 # 35. Continue slow weaning. 06/14/2020. 5. Muscle Spasm: Continuecurrent medication regimen withFlexeril: May take 1-2 tablets at HS.06/14/2020 6. Left Lumbar Radiculitis:Continue HEP as Tolerated . Continue to Monitor.06/14/2020 7. Left Knee Osteoarthritis:No complaints today.S/PLeft Knee Injectionon 01/15/2020with good relief noted.06/14/2020   F/U in 1 month  85minutes of face to face patient care time was spent during this visit. All questions were encouraged and answered.

## 2020-07-17 ENCOUNTER — Encounter: Payer: Self-pay | Admitting: Registered Nurse

## 2020-07-17 ENCOUNTER — Encounter: Payer: BC Managed Care – PPO | Attending: Physical Medicine and Rehabilitation | Admitting: Registered Nurse

## 2020-07-17 ENCOUNTER — Other Ambulatory Visit: Payer: Self-pay

## 2020-07-17 VITALS — BP 116/78 | HR 61 | Temp 98.6°F | Ht 61.0 in | Wt 172.4 lb

## 2020-07-17 DIAGNOSIS — M961 Postlaminectomy syndrome, not elsewhere classified: Secondary | ICD-10-CM | POA: Diagnosis not present

## 2020-07-17 DIAGNOSIS — M47816 Spondylosis without myelopathy or radiculopathy, lumbar region: Secondary | ICD-10-CM | POA: Diagnosis not present

## 2020-07-17 DIAGNOSIS — M62838 Other muscle spasm: Secondary | ICD-10-CM | POA: Insufficient documentation

## 2020-07-17 DIAGNOSIS — M797 Fibromyalgia: Secondary | ICD-10-CM | POA: Diagnosis not present

## 2020-07-17 DIAGNOSIS — M47817 Spondylosis without myelopathy or radiculopathy, lumbosacral region: Secondary | ICD-10-CM | POA: Diagnosis not present

## 2020-07-17 DIAGNOSIS — F0781 Postconcussional syndrome: Secondary | ICD-10-CM | POA: Diagnosis not present

## 2020-07-17 DIAGNOSIS — G894 Chronic pain syndrome: Secondary | ICD-10-CM | POA: Diagnosis not present

## 2020-07-17 DIAGNOSIS — Z5181 Encounter for therapeutic drug level monitoring: Secondary | ICD-10-CM | POA: Diagnosis not present

## 2020-07-17 DIAGNOSIS — M5416 Radiculopathy, lumbar region: Secondary | ICD-10-CM

## 2020-07-17 DIAGNOSIS — Z79891 Long term (current) use of opiate analgesic: Secondary | ICD-10-CM

## 2020-07-17 MED ORDER — METHYLPHENIDATE HCL 10 MG PO TABS: 10.0000 mg | ORAL_TABLET | Freq: Two times a day (BID) | ORAL | 0 refills | Status: DC

## 2020-07-17 MED ORDER — HYDROCODONE-ACETAMINOPHEN 10-325 MG PO TABS: 1.0000 | ORAL_TABLET | Freq: Four times a day (QID) | ORAL | 0 refills | Status: DC | PRN

## 2020-07-17 MED ORDER — ARIPIPRAZOLE 2 MG PO TABS: 2.0000 mg | ORAL_TABLET | Freq: Every day | ORAL | 1 refills | Status: DC

## 2020-07-17 NOTE — Progress Notes (Signed)
Subjective:    Patient ID: [name], female    DOB: Jul 20, 1956, 64 y.o.   MRN: [id number]  HPI: [name] is a 64 y.o. female who returns for follow up appointment for chronic pain and medication refill. She states her pain is located in her lower back radiating into her left buttock and into her left lower extremity to her knee. She rates her pain 9. Her  current exercise regime is walking and performing stretching exercises.  Mr. Kochanski Morphine equivalent is 40.00  MME.  She is also prescribed Alprazolam . We have discussed the black box warning of using opioids and benzodiazepines. I highlighted the dangers of using these drugs together and discussed the adverse events including respiratory suppression, overdose, cognitive impairment and importance of compliance with current regimen. We will continue to monitor and adjust as indicated.   Last Oral Swab was Performed on 05/14/2020, it was consistent.   Pain Inventory Average Pain 9 Pain Right Now 9 My pain is sharp, burning, dull, stabbing, tingling and aching  In the last 24 hours, has pain interfered with the following? General activity 10 Relation with others 9 Enjoyment of life 9 What TIME of day is your pain at its worst? morning , daytime, evening and night Sleep (in general) Fair  Pain is worse with: walking, bending, sitting, inactivity, standing, unsure and some activites Pain improves with: rest, heat/ice, pacing activities and medication Relief from Meds: 7  Family History  Problem Relation Age of Onset  . Cervical cancer Mother   . Diabetes Mother   . Stroke Mother   . Alzheimer's disease Mother   . Heart attack Father   . Stroke Father   . Colon cancer Father   . Anxiety disorder Maternal Aunt   . Depression Maternal Aunt   . Anxiety disorder Maternal Uncle   . Depression Maternal Uncle        suicide attempt by gun shot wound to head. Survived  . Alcohol abuse Other   . Drug abuse Other     . Breast cancer Maternal Grandmother   . Diabetes Maternal Grandmother   . Breast cancer Paternal Aunt   . Colon polyps Sister        x 2   Social History   Socioeconomic History  . Marital status: Married    Spouse name: Not on file  . Number of children: 0  . Years of education: Not on file  . Highest education level: Not on file  Occupational History  . Occupation: disabled  Tobacco Use  . Smoking status: Never Smoker  . Smokeless tobacco: Never Used  Substance and Sexual Activity  . Alcohol use: No    Alcohol/week: 0.0 standard drinks  . Drug use: No  . Sexual activity: Yes  Other Topics Concern  . Not on file  Social History Narrative   Pt has h.s. degree   Social Determinants of Health   Financial Resource Strain:   . Difficulty of Paying Living Expenses: Not on file  Food Insecurity:   . Worried About Charity fundraiser in the Last Year: Not on file  . Ran Out of Food in the Last Year: Not on file  Transportation Needs:   . Lack of Transportation (Medical): Not on file  . Lack of Transportation (Non-Medical): Not on file  Physical Activity:   . Days of Exercise per Week: Not on file  . Minutes of Exercise per Session: Not on file  Stress:   . Feeling of Stress : Not on file  Social Connections:   . Frequency of Communication with Friends and Family: Not on file  . Frequency of Social Gatherings with Friends and Family: Not on file  . Attends Religious Services: Not on file  . Active Member of Clubs or Organizations: Not on file  . Attends Archivist Meetings: Not on file  . Marital Status: Not on file   Past Surgical History:  Procedure Laterality Date  . ABDOMINAL HYSTERECTOMY  1999  . bunions removed  1988  . C4 C5 cage insertion  06/28/2013  . CHOLECYSTECTOMY  04/25/2008  . EDG  12/17/2004  . ELECTROCARDIOGRAM  05/27/2007  . L5 S1 rod insertion  11/07/2012  . SKIN CANCER EXCISION    . SKIN CANCER EXCISION     several  . TONSILLECTOMY   1981  . TUBAL LIGATION  1997   Past Surgical History:  Procedure Laterality Date  . ABDOMINAL HYSTERECTOMY  1999  . bunions removed  1988  . C4 C5 cage insertion  06/28/2013  . CHOLECYSTECTOMY  04/25/2008  . EDG  12/17/2004  . ELECTROCARDIOGRAM  05/27/2007  . L5 S1 rod insertion  11/07/2012  . SKIN CANCER EXCISION    . SKIN CANCER EXCISION     several  . TONSILLECTOMY  1981  . TUBAL LIGATION  1997   Past Medical History:  Diagnosis Date  . Anemia   . ANXIETY 06/10/2007  . Cataract   . Chronic headaches   . Degenerative arthritis of spine    back and neck  . Depression   . DYSLIPIDEMIA 06/28/2008  . Esophageal stricture   . Fibromyalgia   . Gallstones   . GERD 06/10/2007  . Hiatal hernia   . 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 HYPERTENSION 06/10/2007  . Irritable bowel syndrome 06/28/2008  . OSTEOARTHRITIS 06/10/2007  . Skin cancer    basal cell  . Tubular adenoma of colon    BP 116/78   Pulse 61   Temp 98.6 F (37 C)   Ht 5\' 1"  (1.549 m)   Wt 172 lb 6.4 oz (78.2 kg)   SpO2 96%   BMI 32.57 kg/m   Opioid Risk Score:   Fall Risk Score:  `1  Depression screen PHQ 2/9  Depression screen [facility] 2/9 05/14/2020 01/18/2020 10/24/2019 10/18/2019 03/13/2019 02/07/2019 12/06/2018  Decreased Interest 0 1 1 1 1 1 1   Down, Depressed, Hopeless 0 1 1 1 1 1 1   PHQ - 2 Score 0 2 2 2 2 2 2   Altered sleeping - - 0 - - - -  Tired, decreased energy - - 3 - - - -  Change in appetite - - 3 - - - -  Feeling bad or failure about yourself  - - 0 - - - -  Trouble concentrating - - 0 - - - -  Moving slowly or fidgety/restless - - 0 - - - -  Suicidal thoughts - - 0 - - - -  PHQ-9 Score - - 8 - - - -  Difficult doing work/chores - - - - - - -  Some recent data might be hidden    Review of Systems  Musculoskeletal: Positive for gait problem.       Thigh pain spasms  Neurological: Positive for weakness and numbness.       Tingling   All other systems reviewed and are negative.      Objective:   Physical  Exam Vitals and nursing note reviewed.  Constitutional:      Appearance: Normal appearance.  Cardiovascular:     Rate and Rhythm: Normal rate and regular rhythm.     Pulses: Normal pulses.     Heart sounds: Normal heart sounds.  Pulmonary:     Effort: Pulmonary effort is normal.     Breath sounds: Normal breath sounds.  Musculoskeletal:     Cervical back: Normal range of motion and neck supple.     Comments: Normal Muscle Bulk and Muscle Testing Reveals:  Upper Extremities: Full ROM and Muscle Strength 5/5  Lumbar Paraspinal Tenderness: L-4-L-5 Mainly Left Side Left Greater Trochanter Tenderness Lower Extremities: Full ROM and Muscle Strength 5/5 Arises from chair with ease Narrow Based Gait   Skin:    General: Skin is warm and dry.  Neurological:     Mental Status: She is alert and oriented to person, place, and time.  Psychiatric:        Mood and Affect: Mood normal.        Behavior: Behavior normal.           Assessment & Plan:  1. History of chronic lumbar facet disease with spondylosis/DDD/ L4-5 spondylolisthesis.With L4-5 L5-S1 decompression stabilization:07/17/2020 Refilled:Hydrocodone 10/325mg  one tablet every 6 hours as needed #120. We will continue the opioid monitoring program, this consists of regular clinic visits, examinations, urine drug screen, pill counts as well as use of [place] Controlled Substance Reporting system. A 12 month History has been reviewed on the [place] on 07/17/2020. 2. Depression with anxiety/ PTSD: Dr. Sima Matas Following.Continue Xanax,Abilifyand Effexor.07/17/2020 3. Cervical spondylosis: Stable at this time with no complaints. Continue Medication Regime.07/17/2020 4. Post Concussion Syndrome09/25/2015 : Has ongoing memory and concentration deficits.Ritalin: Refilled: Ritalin10 mgOne tablet at Breakfast and Lunch.# 35. Continue slow weaning.07/17/2020. 5. Muscle Spasm:  Continuecurrent medication regimen withFlexeril: May take 1-2 tablets at HS.07/17/2020 6. Left Lumbar Radiculitis:Continue HEP as Tolerated .Continue to Monitor.07/17/2020 7. Left Knee Osteoarthritis:No complaints today.S/PLeft Knee Injectionon 01/15/2020with good relief noted.07/17/2020  F/U in 1 month  81minutes of face to face patient care time was spent during this visit. All questions were encouraged and answered.

## 2020-08-14 ENCOUNTER — Encounter: Payer: Self-pay | Admitting: Registered Nurse

## 2020-08-14 ENCOUNTER — Encounter: Payer: BC Managed Care – PPO | Attending: Physical Medicine and Rehabilitation | Admitting: Registered Nurse

## 2020-08-14 ENCOUNTER — Other Ambulatory Visit: Payer: Self-pay

## 2020-08-14 VITALS — BP 157/88 | HR 66 | Temp 98.9°F | Ht 61.0 in | Wt 170.0 lb

## 2020-08-14 DIAGNOSIS — M47817 Spondylosis without myelopathy or radiculopathy, lumbosacral region: Secondary | ICD-10-CM | POA: Diagnosis not present

## 2020-08-14 DIAGNOSIS — M961 Postlaminectomy syndrome, not elsewhere classified: Secondary | ICD-10-CM | POA: Insufficient documentation

## 2020-08-14 DIAGNOSIS — M62838 Other muscle spasm: Secondary | ICD-10-CM | POA: Diagnosis not present

## 2020-08-14 DIAGNOSIS — G8929 Other chronic pain: Secondary | ICD-10-CM | POA: Insufficient documentation

## 2020-08-14 DIAGNOSIS — M25561 Pain in right knee: Secondary | ICD-10-CM

## 2020-08-14 DIAGNOSIS — M5416 Radiculopathy, lumbar region: Secondary | ICD-10-CM | POA: Diagnosis not present

## 2020-08-14 DIAGNOSIS — G894 Chronic pain syndrome: Secondary | ICD-10-CM

## 2020-08-14 DIAGNOSIS — F0781 Postconcussional syndrome: Secondary | ICD-10-CM | POA: Insufficient documentation

## 2020-08-14 DIAGNOSIS — M47816 Spondylosis without myelopathy or radiculopathy, lumbar region: Secondary | ICD-10-CM | POA: Diagnosis not present

## 2020-08-14 DIAGNOSIS — Z79891 Long term (current) use of opiate analgesic: Secondary | ICD-10-CM | POA: Diagnosis not present

## 2020-08-14 DIAGNOSIS — M797 Fibromyalgia: Secondary | ICD-10-CM | POA: Diagnosis not present

## 2020-08-14 DIAGNOSIS — Z5181 Encounter for therapeutic drug level monitoring: Secondary | ICD-10-CM

## 2020-08-14 MED ORDER — METHYLPHENIDATE HCL 10 MG PO TABS: 10.0000 mg | ORAL_TABLET | Freq: Two times a day (BID) | ORAL | 0 refills | Status: DC

## 2020-08-14 MED ORDER — HYDROCODONE-ACETAMINOPHEN 10-325 MG PO TABS: 1.0000 | ORAL_TABLET | Freq: Four times a day (QID) | ORAL | 0 refills | Status: DC | PRN

## 2020-08-14 NOTE — Progress Notes (Signed)
Subjective:    Patient ID: [name], female    DOB: [birth year], 64 y.o.   MRN: [id number]  HPI: [name] is a 64 y.o. female who returns for follow up appointment for chronic pain and medication refill. She states her pain is located in her lower back pain radiating into her left lower extremity and right knee pain. She rates her pain 9. Her current exercise regime is walking, she was encouraged to increase her HEP as tolerate. She verbalizes understanding.   [name] equivalent is 40.00  MME. She is also prescribed Alprazolam. We have discussed the black box warning of using opioids and benzodiazepines. I highlighted the dangers of using these drugs together and discussed the adverse events including respiratory suppression, overdose, cognitive impairment and importance of compliance with current regimen. We will continue to monitor and adjust as indicated.   [name] last Oral Swab was Performed on 01/18/2020, it was consistent.    Pain Inventory Average Pain 9 Pain Right Now 9 My pain is sharp, burning, dull, stabbing, tingling and aching  In the last 24 hours, has pain interfered with the following? General activity 10 Relation with others 10 Enjoyment of life 10 What TIME of day is your pain at its worst? morning , daytime, evening and night Sleep (in general) Fair  Pain is worse with: walking, bending, sitting, inactivity, standing and some activites Pain improves with: rest, heat/ice, pacing activities and medication Relief from Meds: 8  Family History  Problem Relation Age of Onset  . Cervical cancer Mother   . Diabetes Mother   . Stroke Mother   . Alzheimer's disease Mother   . Heart attack Father   . Stroke Father   . Colon cancer Father   . Anxiety disorder Maternal Aunt   . Depression Maternal Aunt   . Anxiety disorder Maternal Uncle   . Depression Maternal Uncle        suicide attempt by gun shot wound to head. Survived  .  Alcohol abuse Other   . Drug abuse Other   . Breast cancer Maternal Grandmother   . Diabetes Maternal Grandmother   . Breast cancer Paternal Aunt   . Colon polyps Sister        x 2   Social History   Socioeconomic History  . Marital status: Married    Spouse name: Not on file  . Number of children: 0  . Years of education: Not on file  . Highest education level: Not on file  Occupational History  . Occupation: disabled  Tobacco Use  . Smoking status: Never Smoker  . Smokeless tobacco: Never Used  Substance and Sexual Activity  . Alcohol use: No    Alcohol/week: 0.0 standard drinks  . Drug use: No  . Sexual activity: Yes  Other Topics Concern  . Not on file  Social History Narrative   Pt has h.s. degree   Social Determinants of Health   Financial Resource Strain:   . Difficulty of Paying Living Expenses: Not on file  Food Insecurity:   . Worried About Charity fundraiser in the Last Year: Not on file  . Ran Out of Food in the Last Year: Not on file  Transportation Needs:   . Lack of Transportation (Medical): Not on file  . Lack of Transportation (Non-Medical): Not on file  Physical Activity:   . Days of Exercise per Week: Not on file  . Minutes of Exercise per Session:  Not on file  Stress:   . Feeling of Stress : Not on file  Social Connections:   . Frequency of Communication with Friends and Family: Not on file  . Frequency of Social Gatherings with Friends and Family: Not on file  . Attends Religious Services: Not on file  . Active Member of Clubs or Organizations: Not on file  . Attends Archivist Meetings: Not on file  . Marital Status: Not on file   Past Surgical History:  Procedure Laterality Date  . ABDOMINAL HYSTERECTOMY  1999  . bunions removed  1988  . C4 C5 cage insertion  06/28/2013  . CHOLECYSTECTOMY  04/25/2008  . EDG  12/17/2004  . ELECTROCARDIOGRAM  05/27/2007  . L5 S1 rod insertion  11/07/2012  . SKIN CANCER EXCISION    . SKIN CANCER  EXCISION     several  . TONSILLECTOMY  1981  . TUBAL LIGATION  1997   Past Surgical History:  Procedure Laterality Date  . ABDOMINAL HYSTERECTOMY  1999  . bunions removed  1988  . C4 C5 cage insertion  06/28/2013  . CHOLECYSTECTOMY  04/25/2008  . EDG  12/17/2004  . ELECTROCARDIOGRAM  05/27/2007  . L5 S1 rod insertion  11/07/2012  . SKIN CANCER EXCISION    . SKIN CANCER EXCISION     several  . TONSILLECTOMY  1981  . TUBAL LIGATION  1997   Past Medical History:  Diagnosis Date  . Anemia   . ANXIETY 06/10/2007  . Cataract   . Chronic headaches   . Degenerative arthritis of spine    back and neck  . Depression   . DYSLIPIDEMIA 06/28/2008  . Esophageal stricture   . Fibromyalgia   . Gallstones   . GERD 06/10/2007  . Hiatal hernia   . 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 HYPERTENSION 06/10/2007  . Irritable bowel syndrome 06/28/2008  . OSTEOARTHRITIS 06/10/2007  . Skin cancer    basal cell  . Tubular adenoma of colon    BP (!) 157/88   Pulse 66   Temp 98.9 F (37.2 C)   Ht 5\' 1"  (1.549 m)   Wt 170 lb (77.1 kg)   SpO2 (!) 88%   BMI 32.12 kg/m   Opioid Risk Score:   Fall Risk Score:  `1  Depression screen PHQ 2/9  Depression screen [facility] 2/9 05/14/2020 01/18/2020 10/24/2019 10/18/2019 03/13/2019 02/07/2019 12/06/2018  Decreased Interest 0 1 1 1 1 1 1   Down, Depressed, Hopeless 0 1 1 1 1 1 1   PHQ - 2 Score 0 2 2 2 2 2 2   Altered sleeping - - 0 - - - -  Tired, decreased energy - - 3 - - - -  Change in appetite - - 3 - - - -  Feeling bad or failure about yourself  - - 0 - - - -  Trouble concentrating - - 0 - - - -  Moving slowly or fidgety/restless - - 0 - - - -  Suicidal thoughts - - 0 - - - -  PHQ-9 Score - - 8 - - - -  Difficult doing work/chores - - - - - - -  Some recent data might be hidden    Review of Systems  Constitutional: Negative.   HENT: Negative.   Respiratory: Negative.   Gastrointestinal: Negative.   Endocrine: Negative.   Genitourinary: Negative.   Musculoskeletal: Positive  for back pain.  Skin: Negative.   Allergic/Immunologic: Negative.   Neurological: Positive for  weakness and numbness.       Tingling   Psychiatric/Behavioral: Negative.   All other systems reviewed and are negative.      Objective:   Physical Exam Vitals and nursing note reviewed.  Constitutional:      Appearance: Normal appearance.  Cardiovascular:     Rate and Rhythm: Normal rate and regular rhythm.     Pulses: Normal pulses.     Heart sounds: Normal heart sounds.  Pulmonary:     Effort: Pulmonary effort is normal.     Breath sounds: Normal breath sounds.  Musculoskeletal:     Cervical back: Normal range of motion and neck supple.     Comments: Normal Muscle Bulk and Muscle Testing Reveals:  Upper Extremities: Full ROM and Muscle Strength 5/5 Lumbar Paraspinal Tenderness: L-3-L-5 Lower Extremities: Full ROM and Muscle Strength 5/5 Right Lower Extremity Flexion Produces Pain into her right patella Arises from chair slowly Narrow Based Gait   Skin:    General: Skin is warm and dry.  Neurological:     Mental Status: She is alert and oriented to person, place, and time.  Psychiatric:        Mood and Affect: Mood normal.        Behavior: Behavior normal.           Assessment & Plan:  1. History of chronic lumbar facet disease with spondylosis/DDD/ L4-5 spondylolisthesis.With L4-5 L5-S1 decompression stabilization:08/14/2020 Refilled:Hydrocodone 10/325mg  one tablet every 6 hours as needed #120. We will continue the opioid monitoring program, this consists of regular clinic visits, examinations, urine drug screen, pill counts as well as use of [place] Controlled Substance Reporting system. A 12 month History has been reviewed on the [place] on 08/14/2020. 2. Depression with anxiety/ PTSD: Dr. Sima Matas Following.Continue Xanax,Abilifyand Effexor.08/14/2020 3. Cervical spondylosis: Stable at this time with no  complaints. Continue Medication Regime.08/14/2020 4. 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 Continue slow weaning.08/14/2020. 5. Muscle Spasm: Continuecurrent medication regimen withFlexeril: May take 1-2 tablets at HS.08/14/2020 6. Left Lumbar Radiculitis:Continue HEP as Tolerated .Continue to Monitor.08/14/2020 7. Left Knee Osteoarthritis:No complaints today.S/PLeft Knee Injectionon 01/15/2020with good relief noted.08/14/2020 8. Right Knee Pain: Denies falling. Reports she has been cleaning and on her knees. Encouraged to alternate with ice and heat therapy. Continue to monitor.   F/U in 1 month  21minutes of face to face patient care time was spent during this visit. All questions were encouraged and answered.

## 2020-09-11 ENCOUNTER — Encounter: Payer: Self-pay | Admitting: Registered Nurse

## 2020-09-11 ENCOUNTER — Other Ambulatory Visit: Payer: Self-pay

## 2020-09-11 ENCOUNTER — Encounter: Payer: BC Managed Care – PPO | Attending: Physical Medicine and Rehabilitation | Admitting: Registered Nurse

## 2020-09-11 VITALS — BP 134/81 | HR 69 | Temp 98.8°F | Ht 61.0 in | Wt 171.2 lb

## 2020-09-11 DIAGNOSIS — M961 Postlaminectomy syndrome, not elsewhere classified: Secondary | ICD-10-CM | POA: Insufficient documentation

## 2020-09-11 DIAGNOSIS — M797 Fibromyalgia: Secondary | ICD-10-CM

## 2020-09-11 DIAGNOSIS — M47817 Spondylosis without myelopathy or radiculopathy, lumbosacral region: Secondary | ICD-10-CM | POA: Insufficient documentation

## 2020-09-11 DIAGNOSIS — M62838 Other muscle spasm: Secondary | ICD-10-CM

## 2020-09-11 DIAGNOSIS — Z79891 Long term (current) use of opiate analgesic: Secondary | ICD-10-CM | POA: Insufficient documentation

## 2020-09-11 DIAGNOSIS — M5416 Radiculopathy, lumbar region: Secondary | ICD-10-CM

## 2020-09-11 DIAGNOSIS — M47816 Spondylosis without myelopathy or radiculopathy, lumbar region: Secondary | ICD-10-CM | POA: Diagnosis not present

## 2020-09-11 DIAGNOSIS — F0781 Postconcussional syndrome: Secondary | ICD-10-CM | POA: Diagnosis not present

## 2020-09-11 DIAGNOSIS — G894 Chronic pain syndrome: Secondary | ICD-10-CM | POA: Insufficient documentation

## 2020-09-11 DIAGNOSIS — Z5181 Encounter for therapeutic drug level monitoring: Secondary | ICD-10-CM

## 2020-09-11 MED ORDER — HYDROCODONE-ACETAMINOPHEN 10-325 MG PO TABS: 1.0000 | ORAL_TABLET | Freq: Four times a day (QID) | ORAL | 0 refills | Status: DC | PRN

## 2020-09-11 MED ORDER — METHYLPHENIDATE HCL 10 MG PO TABS: 10.0000 mg | ORAL_TABLET | Freq: Two times a day (BID) | ORAL | 0 refills | Status: DC

## 2020-09-11 NOTE — Progress Notes (Signed)
Subjective:    Patient ID: [name], female    DOB: December 19, 1955, 64 y.o.   MRN: [id number]  HPI: [name] is a 64 y.o. female who returns for follow up appointment for chronic pain and medication refill. She states her pain is located in her lower back radiating into her left lower extremity and right knee pain. She rates her pain 9. Her current exercise regime is walking and performing stretching exercises.  [name] Morphine equivalent is 40.00 MME.  Last Oral Swab was Performed on 05/14/2020, see note for details.    Pain Inventory Average Pain 8 Pain Right Now 9 My pain is constant, sharp, burning, dull, stabbing, tingling and aching  In the last 24 hours, has pain interfered with the following? General activity 10 Relation with others 10 Enjoyment of life 10 What TIME of day is your pain at its worst? morning , daytime, evening and night Sleep (in general) Fair  Pain is worse with: walking, bending, sitting, inactivity, standing and unsure Pain improves with: rest, heat/ice, pacing activities, medication and injections Relief from Meds: 8  Family History  Problem Relation Age of Onset  . Cervical cancer Mother   . Diabetes Mother   . Stroke Mother   . Alzheimer's disease Mother   . Heart attack Father   . Stroke Father   . Colon cancer Father   . Anxiety disorder Maternal Aunt   . Depression Maternal Aunt   . Anxiety disorder Maternal Uncle   . Depression Maternal Uncle        suicide attempt by gun shot wound to head. Survived  . Alcohol abuse Other   . Drug abuse Other   . Breast cancer Maternal Grandmother   . Diabetes Maternal Grandmother   . Breast cancer Paternal Aunt   . Colon polyps Sister        x 2   Social History   Socioeconomic History  . Marital status: Married    Spouse name: Not on file  . Number of children: 0  . Years of education: Not on file  . Highest education level: Not on file  Occupational History  .  Occupation: disabled  Tobacco Use  . Smoking status: Never Smoker  . Smokeless tobacco: Never Used  Substance and Sexual Activity  . Alcohol use: No    Alcohol/week: 0.0 standard drinks  . Drug use: No  . Sexual activity: Yes  Other Topics Concern  . Not on file  Social History Narrative   Pt has h.s. degree   Social Determinants of Health   Financial Resource Strain:   . Difficulty of Paying Living Expenses: Not on file  Food Insecurity:   . Worried About Charity fundraiser in the Last Year: Not on file  . Ran Out of Food in the Last Year: Not on file  Transportation Needs:   . Lack of Transportation (Medical): Not on file  . Lack of Transportation (Non-Medical): Not on file  Physical Activity:   . Days of Exercise per Week: Not on file  . Minutes of Exercise per Session: Not on file  Stress:   . Feeling of Stress : Not on file  Social Connections:   . Frequency of Communication with Friends and Family: Not on file  . Frequency of Social Gatherings with Friends and Family: Not on file  . Attends Religious Services: Not on file  . Active Member of Clubs or Organizations: Not on file  .  Attends Archivist Meetings: Not on file  . Marital Status: Not on file   Past Surgical History:  Procedure Laterality Date  . ABDOMINAL HYSTERECTOMY  1999  . bunions removed  1988  . C4 C5 cage insertion  06/28/2013  . CHOLECYSTECTOMY  04/25/2008  . EDG  12/17/2004  . ELECTROCARDIOGRAM  05/27/2007  . L5 S1 rod insertion  11/07/2012  . SKIN CANCER EXCISION    . SKIN CANCER EXCISION     several  . TONSILLECTOMY  1981  . TUBAL LIGATION  1997   Past Surgical History:  Procedure Laterality Date  . ABDOMINAL HYSTERECTOMY  1999  . bunions removed  1988  . C4 C5 cage insertion  06/28/2013  . CHOLECYSTECTOMY  04/25/2008  . EDG  12/17/2004  . ELECTROCARDIOGRAM  05/27/2007  . L5 S1 rod insertion  11/07/2012  . SKIN CANCER EXCISION    . SKIN CANCER EXCISION     several  .  TONSILLECTOMY  1981  . TUBAL LIGATION  1997   Past Medical History:  Diagnosis Date  . Anemia   . ANXIETY 06/10/2007  . Cataract   . Chronic headaches   . Degenerative arthritis of spine    back and neck  . Depression   . DYSLIPIDEMIA 06/28/2008  . Esophageal stricture   . Fibromyalgia   . Gallstones   . GERD 06/10/2007  . Hiatal hernia   . 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 HYPERTENSION 06/10/2007  . Irritable bowel syndrome 06/28/2008  . OSTEOARTHRITIS 06/10/2007  . Skin cancer    basal cell  . Tubular adenoma of colon    BP 134/81   Pulse 69   Temp 98.8 F (37.1 C)   Ht 5\' 1"  (1.549 m)   Wt 171 lb 3.2 oz (77.7 kg)   SpO2 98%   BMI 32.35 kg/m   Opioid Risk Score:   Fall Risk Score:  `1  Depression screen PHQ 2/9  Depression screen [facility] 2/9 09/11/2020 05/14/2020 01/18/2020 10/24/2019 10/18/2019 03/13/2019 02/07/2019  Decreased Interest 1 0 1 1 1 1 1   Down, Depressed, Hopeless 1 0 1 1 1 1 1   PHQ - 2 Score 2 0 2 2 2 2 2   Altered sleeping - - - 0 - - -  Tired, decreased energy - - - 3 - - -  Change in appetite - - - 3 - - -  Feeling bad or failure about yourself  - - - 0 - - -  Trouble concentrating - - - 0 - - -  Moving slowly or fidgety/restless - - - 0 - - -  Suicidal thoughts - - - 0 - - -  PHQ-9 Score - - - 8 - - -  Difficult doing work/chores - - - - - - -  Some recent data might be hidden   Review of Systems  Musculoskeletal: Positive for back pain.  All other systems reviewed and are negative.      Objective:   Physical Exam Vitals and nursing note reviewed.  Constitutional:      Appearance: Normal appearance.  Cardiovascular:     Rate and Rhythm: Normal rate and regular rhythm.     Pulses: Normal pulses.     Heart sounds: Normal heart sounds.  Pulmonary:     Effort: Pulmonary effort is normal.     Breath sounds: Normal breath sounds.  Musculoskeletal:     Cervical back: Normal range of motion and neck supple.     Comments:  Normal Muscle Bulk and Muscle Testing Reveals:    Upper Extremities: Full ROM and Muscle Strength 5/5 Lumbar Hypersensitivity Lower Extremities: Full ROM and Muscle Strength 5/5 Arises from chair with ease Narrow Based  Gait   Skin:    General: Skin is warm and dry.  Neurological:     Mental Status: She is alert and oriented to person, place, and time.  Psychiatric:        Mood and Affect: Mood normal.        Behavior: Behavior normal.           Assessment & Plan:  1. History of chronic lumbar facet disease with spondylosis/DDD/ L4-5 spondylolisthesis.With L4-5 L5-S1 decompression stabilization:09/11/2020 Refilled:Hydrocodone 10/325mg  one tablet every 6 hours as needed #120. We will continue the opioid monitoring program, this consists of regular clinic visits, examinations, urine drug screen, pill counts as well as use of [place] Controlled Substance Reporting system. A 12 month History has been reviewed on the [place] Controlled Substance Reporting Systemon 09/11/2020. 2. Depression with anxiety/ PTSD: Dr. Sima Matas Following.Continue Xanax,Abilifyand Effexor.09/11/2020 3. Cervical spondylosis: Stable at this time with no complaints. Continue Medication Regime.09/11/2020 4. 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 Continue slow weaning.09/11/2020. 5. Muscle Spasm: Continuecurrent medication regimen withFlexeril: May take 1-2 tablets at HS.09/11/2020 6. Left Lumbar Radiculitis:Continue HEP as Tolerated .Continue to Monitor.08/14/2020 7. Left Knee Osteoarthritis:No complaints today.S/PLeft Knee Injectionon 01/15/2020with good relief noted.09/11/2020 8. Right Knee Pain: Continue to alternate with ice and heat therapy. Continue to monitor.  09/11/2020  F/U in 1 month

## 2020-09-12 DIAGNOSIS — H16223 Keratoconjunctivitis sicca, not specified as Sjogren's, bilateral: Secondary | ICD-10-CM | POA: Diagnosis not present

## 2020-09-12 DIAGNOSIS — H04123 Dry eye syndrome of bilateral lacrimal glands: Secondary | ICD-10-CM | POA: Diagnosis not present

## 2020-09-30 ENCOUNTER — Other Ambulatory Visit: Payer: Self-pay

## 2020-09-30 ENCOUNTER — Encounter: Payer: BC Managed Care – PPO | Admitting: Psychology

## 2020-09-30 DIAGNOSIS — M797 Fibromyalgia: Secondary | ICD-10-CM

## 2020-09-30 DIAGNOSIS — G894 Chronic pain syndrome: Secondary | ICD-10-CM | POA: Diagnosis not present

## 2020-09-30 DIAGNOSIS — F0781 Postconcussional syndrome: Secondary | ICD-10-CM | POA: Diagnosis not present

## 2020-09-30 DIAGNOSIS — M47817 Spondylosis without myelopathy or radiculopathy, lumbosacral region: Secondary | ICD-10-CM | POA: Diagnosis not present

## 2020-09-30 DIAGNOSIS — Z79891 Long term (current) use of opiate analgesic: Secondary | ICD-10-CM | POA: Diagnosis not present

## 2020-09-30 DIAGNOSIS — M47816 Spondylosis without myelopathy or radiculopathy, lumbar region: Secondary | ICD-10-CM | POA: Diagnosis not present

## 2020-09-30 DIAGNOSIS — M5416 Radiculopathy, lumbar region: Secondary | ICD-10-CM | POA: Diagnosis not present

## 2020-09-30 DIAGNOSIS — Z5181 Encounter for therapeutic drug level monitoring: Secondary | ICD-10-CM | POA: Diagnosis not present

## 2020-09-30 DIAGNOSIS — M62838 Other muscle spasm: Secondary | ICD-10-CM | POA: Diagnosis not present

## 2020-09-30 DIAGNOSIS — M961 Postlaminectomy syndrome, not elsewhere classified: Secondary | ICD-10-CM | POA: Diagnosis not present

## 2020-10-03 ENCOUNTER — Encounter: Payer: Self-pay | Admitting: Psychology

## 2020-10-03 NOTE — Progress Notes (Signed)
Patient:                           [name]           DOB:                               Oct 08, 1956  MR Number:                  [id number]  Location:                        [facility] [address] [place] [id number] [place] [id number] Dept: [phone]  Start:  3 PM End:                                4 PM  Today's visit was an in person visit that was conducted in my outpatient clinic office with the patient myself present.  Provider/Observer:                           Edgardo Roys PSYD  Chief Complaint:                                   Chief Complaint  Patient presents with  . Post-Traumatic Stress Disorder  . Pain  . Depression  . Memory Loss  . Anxiety    Reason For Service:                         [name] is a 64 year old female referred by Dr. Naaman Plummer due to issues of coping and dealing with a number of stressors. She has a history of postconcussion syndrome, chronic pain syndrome as well as PTSD.  The patient has been dealing with significant anxiety and depression along with a history of PTSD and postconcussion syndrome. The patient was referred for psychotherapeutic interventions primarily because of the issues of depression and coping with family stress.  The above reason for service was reviewed and remains accurate.  The patient reports that there has been significant improvements in psychosocial issues but there continues to be a lot of stress particularly around issues related to her mother's dementia and the mother not recognizing the patient.  The above reason for service was reviewed and remains applicable for the current visit.  The patient has been having more depressive symptoms with increased psychosocial stressors including her mother at end stages of Alzheimer's Dementia.  The mother is living with patient's sister and while the  mother likely needs a SNF the sister insists on taking care of the mother and allowing the mother to be at home when she dies.  However, this results in patient having to help the sister in significant ways including cleaning the sister's house and caring for mother that has significant medical and neurological needs.    The above reason for service has been reviewed and remains applicable for the current visit.  I have not seen the patient since May of this year.  The patient's mother has passed away since I saw her and things went relatively smoothly after  the death of her mother as the older sister took over the finalization of the will and a state.  The patient continues to have conflicts with her other sister as the other sister is described as having traits consistent with significant narcissistic personality disorder.  Interventions Strategy:                    Cognitive/behavioral psychotherapeutic interventions  Participation Level:                           The patient was appropriate and active during current visit with good mental status.    Participation Quality:                       Appropriate and attentive                          Behavioral Observation:                  The patient was well-groomed, alert and appropriate in her interactions.  Current Psychosocial Factors:        The patient reports that the death of her mother was very painful for her in 1 way but her relief and another as the mother had been having significant progressive cognitive decline and dementia and was unable to have a significant quality of life.  The patient has had to interact with the sister she has the most interpersonal conflict with but this is diminished some since her mother's passing.   Content of Session:                           Reviewed current symptoms and continued to work on issues of depression, pain, PTSD and anxiety.  Reviewed current symptoms and continue to work on therapeutic issues  around pain, PTSD, and depression/anxiety.  Current Status:                                   T the patient reports that her coping skills and strategies have continued to improve and that she is doing much better as far as her interactions.  She reports that her anxiety and depressive symptoms have been improving.  Patient Progress:                               The patient continues to show improvement in her overall functioning.  Impression/Diagnosis:                     [name] is a 64 year old female referred by Dr. Naaman Plummer due to issues of coping and dealing with a number of stressors. She has a history of postconcussion syndrome, chronic pain syndrome as well as PTSD. The patient describes significant difficulty with issues related to her family and the family estate. There were issues when her father passed away where an older sister was in charge of everything and all of his possessions were been ago to this older sister. However, the middle sister began having trouble and when the patient could not help the sister rejected the patient. There've been a number of family conflicts going on including a niece who got in trouble with doing drugs and her  excuse to the patient's sister was that the patient was the one who started her doing drugs and giving her money. This is not accurate.  T the patient reports that she managed the death of her mother fairly well.  She continues to have significant pain symptoms, PTSD symptoms and postconcussive symptoms but has been managing better and actively working on therapeutic interventions we have addressed.  Diagnosis:   Lumbar post-laminectomy syndrome  Left lumbar radiculitis  Post concussion syndrome  Fibromyalgia  Chronic pain syndrome

## 2020-10-15 ENCOUNTER — Other Ambulatory Visit: Payer: Self-pay

## 2020-10-15 ENCOUNTER — Encounter: Payer: Self-pay | Admitting: Registered Nurse

## 2020-10-15 ENCOUNTER — Encounter: Payer: BC Managed Care – PPO | Attending: Physical Medicine and Rehabilitation | Admitting: Registered Nurse

## 2020-10-15 VITALS — BP 137/78 | HR 67 | Temp 98.4°F | Ht 61.0 in | Wt 174.2 lb

## 2020-10-15 DIAGNOSIS — M47817 Spondylosis without myelopathy or radiculopathy, lumbosacral region: Secondary | ICD-10-CM | POA: Insufficient documentation

## 2020-10-15 DIAGNOSIS — M62838 Other muscle spasm: Secondary | ICD-10-CM

## 2020-10-15 DIAGNOSIS — G894 Chronic pain syndrome: Secondary | ICD-10-CM | POA: Insufficient documentation

## 2020-10-15 DIAGNOSIS — M545 Low back pain, unspecified: Secondary | ICD-10-CM | POA: Diagnosis not present

## 2020-10-15 DIAGNOSIS — W009XXD Unspecified fall due to ice and snow, subsequent encounter: Secondary | ICD-10-CM | POA: Diagnosis not present

## 2020-10-15 DIAGNOSIS — Z760 Encounter for issue of repeat prescription: Secondary | ICD-10-CM | POA: Insufficient documentation

## 2020-10-15 DIAGNOSIS — Z79891 Long term (current) use of opiate analgesic: Secondary | ICD-10-CM | POA: Diagnosis not present

## 2020-10-15 DIAGNOSIS — F418 Other specified anxiety disorders: Secondary | ICD-10-CM | POA: Diagnosis not present

## 2020-10-15 DIAGNOSIS — M5416 Radiculopathy, lumbar region: Secondary | ICD-10-CM | POA: Insufficient documentation

## 2020-10-15 DIAGNOSIS — Z5181 Encounter for therapeutic drug level monitoring: Secondary | ICD-10-CM | POA: Insufficient documentation

## 2020-10-15 DIAGNOSIS — M1712 Unilateral primary osteoarthritis, left knee: Secondary | ICD-10-CM | POA: Diagnosis not present

## 2020-10-15 DIAGNOSIS — M47812 Spondylosis without myelopathy or radiculopathy, cervical region: Secondary | ICD-10-CM | POA: Insufficient documentation

## 2020-10-15 DIAGNOSIS — M47816 Spondylosis without myelopathy or radiculopathy, lumbar region: Secondary | ICD-10-CM | POA: Diagnosis not present

## 2020-10-15 DIAGNOSIS — M797 Fibromyalgia: Secondary | ICD-10-CM | POA: Insufficient documentation

## 2020-10-15 DIAGNOSIS — F4310 Post-traumatic stress disorder, unspecified: Secondary | ICD-10-CM | POA: Diagnosis not present

## 2020-10-15 DIAGNOSIS — F0781 Postconcussional syndrome: Secondary | ICD-10-CM | POA: Diagnosis not present

## 2020-10-15 DIAGNOSIS — M961 Postlaminectomy syndrome, not elsewhere classified: Secondary | ICD-10-CM | POA: Insufficient documentation

## 2020-10-15 MED ORDER — HYDROCODONE-ACETAMINOPHEN 10-325 MG PO TABS
1.0000 | ORAL_TABLET | Freq: Four times a day (QID) | ORAL | 0 refills | Status: DC | PRN
Start: 2020-10-15 — End: 2020-11-12

## 2020-10-15 MED ORDER — METHYLPHENIDATE HCL 10 MG PO TABS: 10.0000 mg | ORAL_TABLET | Freq: Two times a day (BID) | ORAL | 0 refills | Status: DC

## 2020-10-15 MED ORDER — ALPRAZOLAM 1 MG PO TABS: ORAL_TABLET | ORAL | 3 refills | Status: DC

## 2020-10-15 NOTE — Progress Notes (Signed)
Subjective:    Patient ID: [name], female    DOB: 1956/05/16, 65 y.o.   MRN: [id number]  HPI: [name] is a 65 y.o. female who returns for follow up appointment for chronic pain and medication refill. She states her pain is located in her lower back radiating into her left lower extremity.She  rates her pain 9. Her  current exercise regime is walking and performing stretching exercises.  [name] reports she fell two times on her sister porch, she lost her footing going into her home and leaving, states porch was slick.. She didn't have her cane with her, educated on falls prevention she verbalizes understanding.   [name] Morphine equivalent is 40.00  MME.  Oral Swab was Performed Today.    Pain Inventory Average Pain 9 Pain Right Now 9 My pain is intermittent, sharp, burning, dull, stabbing, tingling and aching  In the last 24 hours, has pain interfered with the following? General activity 10 Relation with others 10 Enjoyment of life 10 What TIME of day is your pain at its worst? varies Sleep (in general) Fair  Pain is worse with: walking, bending, sitting, inactivity, standing, unsure and some activites Pain improves with: rest, pacing activities, medication and Prayer & heat Relief from Meds: 8  Family History  Problem Relation Age of Onset  . Cervical cancer Mother   . Diabetes Mother   . Stroke Mother   . Alzheimer's disease Mother   . Heart attack Father   . Stroke Father   . Colon cancer Father   . Anxiety disorder Maternal Aunt   . Depression Maternal Aunt   . Anxiety disorder Maternal Uncle   . Depression Maternal Uncle        suicide attempt by gun shot wound to head. Survived  . Alcohol abuse Other   . Drug abuse Other   . Breast cancer Maternal Grandmother   . Diabetes Maternal Grandmother   . Breast cancer Paternal Aunt   . Colon polyps Sister        x 2   Social History   Socioeconomic History  . Marital status: Married     Spouse name: Not on file  . Number of children: 0  . Years of education: Not on file  . Highest education level: Not on file  Occupational History  . Occupation: disabled  Tobacco Use  . Smoking status: Never Smoker  . Smokeless tobacco: Never Used  Substance and Sexual Activity  . Alcohol use: No    Alcohol/week: 0.0 standard drinks  . Drug use: No  . Sexual activity: Yes  Other Topics Concern  . Not on file  Social History Narrative   Pt has h.s. degree   Social Determinants of Health   Financial Resource Strain: Not on file  Food Insecurity: Not on file  Transportation Needs: Not on file  Physical Activity: Not on file  Stress: Not on file  Social Connections: Not on file   Past Surgical History:  Procedure Laterality Date  . ABDOMINAL HYSTERECTOMY  1999  . bunions removed  1988  . C4 C5 cage insertion  06/28/2013  . CHOLECYSTECTOMY  04/25/2008  . EDG  12/17/2004  . ELECTROCARDIOGRAM  05/27/2007  . L5 S1 rod insertion  11/07/2012  . SKIN CANCER EXCISION    . SKIN CANCER EXCISION     several  . TONSILLECTOMY  1981  . TUBAL LIGATION  1997   Past Surgical History:  Procedure Laterality  Date  . ABDOMINAL HYSTERECTOMY  1999  . bunions removed  1988  . C4 C5 cage insertion  06/28/2013  . CHOLECYSTECTOMY  04/25/2008  . EDG  12/17/2004  . ELECTROCARDIOGRAM  05/27/2007  . L5 S1 rod insertion  11/07/2012  . SKIN CANCER EXCISION    . SKIN CANCER EXCISION     several  . TONSILLECTOMY  1981  . TUBAL LIGATION  1997   Past Medical History:  Diagnosis Date  . Anemia   . ANXIETY 06/10/2007  . Cataract   . Chronic headaches   . Degenerative arthritis of spine    back and neck  . Depression   . DYSLIPIDEMIA 06/28/2008  . Esophageal stricture   . Fibromyalgia   . Gallstones   . GERD 06/10/2007  . Hiatal hernia   . 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 HYPERTENSION 06/10/2007  . Irritable bowel syndrome 06/28/2008  . OSTEOARTHRITIS 06/10/2007  . Skin cancer    basal cell  . Tubular adenoma  of colon    There were no vitals taken for this visit.  Opioid Risk Score:   Fall Risk Score:  `1  Depression screen PHQ 2/9  Depression screen [facility] 2/9 09/11/2020 05/14/2020 01/18/2020 10/24/2019 10/18/2019 03/13/2019 02/07/2019  Decreased Interest 1 0 1 1 1 1 1   Down, Depressed, Hopeless 1 0 1 1 1 1 1   PHQ - 2 Score 2 0 2 2 2 2 2   Altered sleeping - - - 0 - - -  Tired, decreased energy - - - 3 - - -  Change in appetite - - - 3 - - -  Feeling bad or failure about yourself  - - - 0 - - -  Trouble concentrating - - - 0 - - -  Moving slowly or fidgety/restless - - - 0 - - -  Suicidal thoughts - - - 0 - - -  PHQ-9 Score - - - 8 - - -  Difficult doing work/chores - - - - - - -  Some recent data might be hidden   Review of Systems  Musculoskeletal: Positive for back pain and gait problem.  All other systems reviewed and are negative.      Objective:   Physical Exam Vitals and nursing note reviewed.  Constitutional:      Appearance: Normal appearance.  Cardiovascular:     Rate and Rhythm: Normal rate and regular rhythm.     Pulses: Normal pulses.     Heart sounds: Normal heart sounds.  Pulmonary:     Effort: Pulmonary effort is normal.     Breath sounds: Normal breath sounds.  Musculoskeletal:     Cervical back: Normal range of motion and neck supple.     Comments: Normal Muscle Bulk and Muscle Testing Reveals:  Upper Extremities: Full ROM and Muscle Strength 5/5  Lumbar Paraspinal Tenderness: L-3-L-5 Lower Extremities: Full ROM and Muscle Strength 5/5 Arises from chair with ease Narrow Based Gait   Skin:    General: Skin is warm and dry.  Neurological:     Mental Status: She is alert and oriented to person, place, and time.  Psychiatric:        Mood and Affect: Mood normal.        Behavior: Behavior normal.           Assessment & Plan:  1. History of chronic lumbar facet disease with spondylosis/DDD/ L4-5 spondylolisthesis.With L4-5 L5-S1 decompression  stabilization:10/15/2020 Refilled:Hydrocodone 10/325mg  one tablet every 6 hours as needed #120.  We will continue the opioid monitoring program, this consists of regular clinic visits, examinations, urine drug screen, pill counts as well as use of [place] Controlled Substance Reporting system. A 12 month History has been reviewed on the [place] Controlled Substance Reporting Systemon 10/15/2020. 2. Depression with anxiety/ PTSD: Dr. Kieth Brightly Following.Continue Xanax,Abilifyand Effexor.10/15/2020 3. Cervical spondylosis: Stable at this time with no complaints. Continue Medication Regime.10/15/2020 4. 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 Continue slow weaning.10/15/2020. 5. Muscle Spasm: Continuecurrent medication regimen withFlexeril: May take 1-2 tablets at HS.10/15/2020 6. Left Lumbar Radiculitis:Continue HEP as Tolerated .Continue to Monitor.10/15/2020. 7. Left Knee Osteoarthritis:No complaints today.S/PLeft Knee Injectionon 01/15/2020with good relief noted.10/15/2020 8. Right Knee Pain: No complaints today. Continue to alternate with ice and heat therapy. Continue to monitor. 10/15/2020 9. Fall at sister's home: Educated on falls prevention. She verbalizes understanding.   F/U in 1 month

## 2020-10-18 ENCOUNTER — Other Ambulatory Visit: Payer: Self-pay | Admitting: Family Medicine

## 2020-10-19 LAB — DRUG TOX MONITOR 1 W/CONF, ORAL FLD
Alprazolam: 1.55 ng/mL — ABNORMAL HIGH (ref <0.50)
Amphetamines: NEGATIVE ng/mL (ref <10)
Barbiturates: NEGATIVE ng/mL (ref <10)
Benzodiazepines: POSITIVE ng/mL — AB (ref <0.50)
Buprenorphine: NEGATIVE ng/mL (ref <0.10)
Chlordiazepoxide: NEGATIVE ng/mL (ref <0.50)
Clonazepam: NEGATIVE ng/mL (ref <0.50)
Cocaine: NEGATIVE ng/mL (ref <5.0)
Codeine: NEGATIVE ng/mL (ref <2.5)
Diazepam: NEGATIVE ng/mL (ref <0.50)
Dihydrocodeine: 25.2 ng/mL — ABNORMAL HIGH (ref <2.5)
Fentanyl: NEGATIVE ng/mL (ref <0.10)
Flunitrazepam: NEGATIVE ng/mL (ref <0.50)
Flurazepam: NEGATIVE ng/mL (ref <0.50)
Heroin Metabolite: NEGATIVE ng/mL (ref <1.0)
Hydrocodone: 231.4 ng/mL — ABNORMAL HIGH (ref <2.5)
Hydromorphone: NEGATIVE ng/mL (ref <2.5)
Lorazepam: NEGATIVE ng/mL (ref <0.50)
MARIJUANA: NEGATIVE ng/mL (ref <2.5)
MDMA: NEGATIVE ng/mL (ref <10)
Meprobamate: NEGATIVE ng/mL (ref <2.5)
Methadone: NEGATIVE ng/mL (ref <5.0)
Midazolam: NEGATIVE ng/mL (ref <0.50)
Morphine: NEGATIVE ng/mL (ref <2.5)
Nicotine Metabolite: NEGATIVE ng/mL (ref <5.0)
Nordiazepam: NEGATIVE ng/mL (ref <0.50)
Norhydrocodone: 17.9 ng/mL — ABNORMAL HIGH (ref <2.5)
Noroxycodone: NEGATIVE ng/mL (ref <2.5)
Opiates: POSITIVE ng/mL — AB (ref <2.5)
Oxazepam: NEGATIVE ng/mL (ref <0.50)
Oxycodone: NEGATIVE ng/mL (ref <2.5)
Oxymorphone: NEGATIVE ng/mL (ref <2.5)
Phencyclidine: NEGATIVE ng/mL (ref <10)
Tapentadol: NEGATIVE ng/mL (ref <5.0)
Temazepam: NEGATIVE ng/mL (ref <0.50)
Tramadol: NEGATIVE ng/mL (ref <5.0)
Triazolam: NEGATIVE ng/mL (ref <0.50)
Zolpidem: NEGATIVE ng/mL (ref <5.0)

## 2020-10-19 LAB — DRUG TOX ALC METAB W/CON, ORAL FLD: Alcohol Metabolite: NEGATIVE ng/mL (ref <25)

## 2020-10-19 LAB — DRUG TOX METHYLPHEN W/CONF,ORAL FLD
Methylphenidate: 26.2 ng/mL — ABNORMAL HIGH (ref <1.0)
Methylphenidate: POSITIVE ng/mL — AB (ref <1.0)

## 2020-10-22 DIAGNOSIS — U071 COVID-19: Secondary | ICD-10-CM | POA: Diagnosis not present

## 2020-10-22 DIAGNOSIS — Z20822 Contact with and (suspected) exposure to covid-19: Secondary | ICD-10-CM | POA: Diagnosis not present

## 2020-10-24 ENCOUNTER — Emergency Department (HOSPITAL_COMMUNITY): Payer: BC Managed Care – PPO

## 2020-10-24 ENCOUNTER — Other Ambulatory Visit: Payer: Self-pay

## 2020-10-24 ENCOUNTER — Encounter (HOSPITAL_COMMUNITY): Payer: Self-pay

## 2020-10-24 ENCOUNTER — Inpatient Hospital Stay (HOSPITAL_COMMUNITY)
Admission: EM | Admit: 2020-10-24 | Discharge: 2020-10-26 | DRG: 177 | Disposition: A | Discharge status: Home / Self Care | Payer: BC Managed Care – PPO | Attending: Internal Medicine | Admitting: Internal Medicine

## 2020-10-24 DIAGNOSIS — N3000 Acute cystitis without hematuria: Secondary | ICD-10-CM | POA: Diagnosis not present

## 2020-10-24 DIAGNOSIS — Z800 Family history of malignant neoplasm of digestive organs: Secondary | ICD-10-CM

## 2020-10-24 DIAGNOSIS — U071 COVID-19: Principal | ICD-10-CM | POA: Diagnosis present

## 2020-10-24 DIAGNOSIS — Z833 Family history of diabetes mellitus: Secondary | ICD-10-CM | POA: Diagnosis not present

## 2020-10-24 DIAGNOSIS — Z803 Family history of malignant neoplasm of breast: Secondary | ICD-10-CM | POA: Diagnosis not present

## 2020-10-24 DIAGNOSIS — M479 Spondylosis, unspecified: Secondary | ICD-10-CM | POA: Diagnosis not present

## 2020-10-24 DIAGNOSIS — M797 Fibromyalgia: Secondary | ICD-10-CM | POA: Diagnosis present

## 2020-10-24 DIAGNOSIS — E860 Dehydration: Secondary | ICD-10-CM | POA: Diagnosis present

## 2020-10-24 DIAGNOSIS — E669 Obesity, unspecified: Secondary | ICD-10-CM | POA: Diagnosis not present

## 2020-10-24 DIAGNOSIS — R000 Tachycardia, unspecified: Secondary | ICD-10-CM | POA: Diagnosis not present

## 2020-10-24 DIAGNOSIS — F411 Generalized anxiety disorder: Secondary | ICD-10-CM | POA: Diagnosis present

## 2020-10-24 DIAGNOSIS — R404 Transient alteration of awareness: Secondary | ICD-10-CM | POA: Diagnosis not present

## 2020-10-24 DIAGNOSIS — Z8049 Family history of malignant neoplasm of other genital organs: Secondary | ICD-10-CM

## 2020-10-24 DIAGNOSIS — G894 Chronic pain syndrome: Secondary | ICD-10-CM | POA: Diagnosis present

## 2020-10-24 DIAGNOSIS — Z818 Family history of other mental and behavioral disorders: Secondary | ICD-10-CM

## 2020-10-24 DIAGNOSIS — R0602 Shortness of breath: Secondary | ICD-10-CM | POA: Diagnosis not present

## 2020-10-24 DIAGNOSIS — Z888 Allergy status to other drugs, medicaments and biological substances status: Secondary | ICD-10-CM

## 2020-10-24 DIAGNOSIS — M545 Low back pain, unspecified: Secondary | ICD-10-CM | POA: Diagnosis present

## 2020-10-24 DIAGNOSIS — K589 Irritable bowel syndrome without diarrhea: Secondary | ICD-10-CM | POA: Diagnosis present

## 2020-10-24 DIAGNOSIS — E785 Hyperlipidemia, unspecified: Secondary | ICD-10-CM | POA: Diagnosis not present

## 2020-10-24 DIAGNOSIS — K219 Gastro-esophageal reflux disease without esophagitis: Secondary | ICD-10-CM | POA: Diagnosis not present

## 2020-10-24 DIAGNOSIS — G250 Essential tremor: Secondary | ICD-10-CM | POA: Diagnosis not present

## 2020-10-24 DIAGNOSIS — R0689 Other abnormalities of breathing: Secondary | ICD-10-CM | POA: Diagnosis not present

## 2020-10-24 DIAGNOSIS — Z8249 Family history of ischemic heart disease and other diseases of the circulatory system: Secondary | ICD-10-CM

## 2020-10-24 DIAGNOSIS — N3001 Acute cystitis with hematuria: Secondary | ICD-10-CM

## 2020-10-24 DIAGNOSIS — I10 Essential (primary) hypertension: Secondary | ICD-10-CM | POA: Diagnosis present

## 2020-10-24 DIAGNOSIS — R4182 Altered mental status, unspecified: Secondary | ICD-10-CM

## 2020-10-24 DIAGNOSIS — Z6832 Body mass index (BMI) 32.0-32.9, adult: Secondary | ICD-10-CM

## 2020-10-24 DIAGNOSIS — Z79899 Other long term (current) drug therapy: Secondary | ICD-10-CM

## 2020-10-24 DIAGNOSIS — Z823 Family history of stroke: Secondary | ICD-10-CM

## 2020-10-24 DIAGNOSIS — J208 Acute bronchitis due to other specified organisms: Secondary | ICD-10-CM | POA: Diagnosis not present

## 2020-10-24 DIAGNOSIS — Z820 Family history of epilepsy and other diseases of the nervous system: Secondary | ICD-10-CM

## 2020-10-24 DIAGNOSIS — G9341 Metabolic encephalopathy: Secondary | ICD-10-CM | POA: Diagnosis not present

## 2020-10-24 DIAGNOSIS — Z8371 Family history of colonic polyps: Secondary | ICD-10-CM

## 2020-10-24 DIAGNOSIS — E782 Mixed hyperlipidemia: Secondary | ICD-10-CM | POA: Diagnosis not present

## 2020-10-24 DIAGNOSIS — Z85828 Personal history of other malignant neoplasm of skin: Secondary | ICD-10-CM

## 2020-10-24 DIAGNOSIS — F4310 Post-traumatic stress disorder, unspecified: Secondary | ICD-10-CM | POA: Diagnosis not present

## 2020-10-24 LAB — URINALYSIS, ROUTINE W REFLEX MICROSCOPIC
Bilirubin Urine: NEGATIVE
Glucose, UA: NEGATIVE mg/dL
Ketones, ur: 80 mg/dL — AB
Leukocytes,Ua: NEGATIVE
Nitrite: POSITIVE — AB
Protein, ur: 30 mg/dL — AB
Specific Gravity, Urine: 1.02 (ref 1.005–1.030)
pH: 5 (ref 5.0–8.0)

## 2020-10-24 LAB — COMPREHENSIVE METABOLIC PANEL
ALT: 39 U/L (ref 0–44)
AST: 36 U/L (ref 15–41)
Albumin: 3.5 g/dL (ref 3.5–5.0)
Alkaline Phosphatase: 69 U/L (ref 38–126)
Anion gap: 13 (ref 5–15)
BUN: 11 mg/dL (ref 8–23)
CO2: 21 mmol/L — ABNORMAL LOW (ref 22–32)
Calcium: 8.6 mg/dL — ABNORMAL LOW (ref 8.9–10.3)
Chloride: 102 mmol/L (ref 98–111)
Creatinine, Ser: 0.79 mg/dL (ref 0.44–1.00)
GFR, Estimated: >60 mL/min (ref >60)
Glucose, Bld: 115 mg/dL — ABNORMAL HIGH (ref 70–99)
Potassium: 3.2 mmol/L — ABNORMAL LOW (ref 3.5–5.1)
Sodium: 136 mmol/L (ref 135–145)
Total Bilirubin: 0.7 mg/dL (ref 0.3–1.2)
Total Protein: 6.2 g/dL — ABNORMAL LOW (ref 6.5–8.1)

## 2020-10-24 LAB — CBC WITH DIFFERENTIAL/PLATELET
Abs Immature Granulocytes: 0.01 10*3/uL (ref 0.00–0.07)
Basophils Absolute: 0 10*3/uL (ref 0.0–0.1)
Basophils Relative: 0 %
Eosinophils Absolute: 0 10*3/uL (ref 0.0–0.5)
Eosinophils Relative: 0 %
HCT: 37.7 % (ref 36.0–46.0)
Hemoglobin: 12.7 g/dL (ref 12.0–15.0)
Immature Granulocytes: 0 %
Lymphocytes Relative: 7 %
Lymphs Abs: 0.3 10*3/uL — ABNORMAL LOW (ref 0.7–4.0)
MCH: 28.2 pg (ref 26.0–34.0)
MCHC: 33.7 g/dL (ref 30.0–36.0)
MCV: 83.6 fL (ref 80.0–100.0)
Monocytes Absolute: 0.4 10*3/uL (ref 0.1–1.0)
Monocytes Relative: 8 %
Neutro Abs: 3.9 10*3/uL (ref 1.7–7.7)
Neutrophils Relative %: 85 %
Platelets: 163 10*3/uL (ref 150–400)
RBC: 4.51 MIL/uL (ref 3.87–5.11)
RDW: 14.1 % (ref 11.5–15.5)
WBC: 4.7 10*3/uL (ref 4.0–10.5)
nRBC: 0 % (ref 0.0–0.2)

## 2020-10-24 LAB — D-DIMER, QUANTITATIVE: D-Dimer, Quant: 0.97 ug/mL-FEU — ABNORMAL HIGH (ref 0.00–0.50)

## 2020-10-24 LAB — RAPID URINE DRUG SCREEN, HOSP PERFORMED
Amphetamines: NOT DETECTED
Barbiturates: NOT DETECTED
Benzodiazepines: POSITIVE — AB
Cocaine: NOT DETECTED
Opiates: POSITIVE — AB
Tetrahydrocannabinol: NOT DETECTED

## 2020-10-24 LAB — TRIGLYCERIDES: Triglycerides: 93 mg/dL (ref <150)

## 2020-10-24 LAB — PROTIME-INR
INR: 1.2 (ref 0.8–1.2)
Prothrombin Time: 14.4 seconds (ref 11.4–15.2)

## 2020-10-24 LAB — PROCALCITONIN: Procalcitonin: 0.18 ng/mL

## 2020-10-24 LAB — FIBRINOGEN: Fibrinogen: 675 mg/dL — ABNORMAL HIGH (ref 210–475)

## 2020-10-24 LAB — C-REACTIVE PROTEIN: CRP: 24 mg/dL — ABNORMAL HIGH (ref <1.0)

## 2020-10-24 LAB — APTT: aPTT: 46 seconds — ABNORMAL HIGH (ref 24–36)

## 2020-10-24 LAB — LACTATE DEHYDROGENASE: LDH: 154 U/L (ref 98–192)

## 2020-10-24 LAB — FERRITIN: Ferritin: 241 ng/mL (ref 11–307)

## 2020-10-24 LAB — AMMONIA: Ammonia: 36 umol/L — ABNORMAL HIGH (ref 9–35)

## 2020-10-24 MED ORDER — GUAIFENESIN-DM 100-10 MG/5ML PO SYRP: 10.0000 mL | ORAL_SOLUTION | ORAL | Status: DC | PRN

## 2020-10-24 MED ORDER — SODIUM CHLORIDE 0.9 % IV SOLN: Freq: Once | INTRAVENOUS | Status: AC

## 2020-10-24 MED ORDER — ASCORBIC ACID 500 MG PO TABS
500.0000 mg | ORAL_TABLET | Freq: Every day | ORAL | Status: DC
  Administered 2020-10-24 – 2020-10-26 (×3): 500 mg via ORAL
  Filled 2020-10-24 (×3): qty 1

## 2020-10-24 MED ORDER — SODIUM CHLORIDE 0.9 % IV SOLN
1.0000 g | Freq: Once | INTRAVENOUS | Status: AC
  Administered 2020-10-24: 1 g via INTRAVENOUS
  Filled 2020-10-24: qty 10

## 2020-10-24 MED ORDER — ALBUTEROL SULFATE HFA 108 (90 BASE) MCG/ACT IN AERS
2.0000 | INHALATION_SPRAY | Freq: Four times a day (QID) | RESPIRATORY_TRACT | Status: DC
  Administered 2020-10-24 – 2020-10-26 (×5): 2 via RESPIRATORY_TRACT
  Filled 2020-10-24 (×4): qty 6.7

## 2020-10-24 MED ORDER — ACETAMINOPHEN 325 MG PO TABS
650.0000 mg | ORAL_TABLET | Freq: Four times a day (QID) | ORAL | Status: DC | PRN
  Administered 2020-10-26: 650 mg via ORAL
  Filled 2020-10-24: qty 2

## 2020-10-24 MED ORDER — ONDANSETRON HCL 4 MG PO TABS: 4.0000 mg | ORAL_TABLET | Freq: Four times a day (QID) | ORAL | Status: DC | PRN

## 2020-10-24 MED ORDER — SODIUM CHLORIDE 0.9 % IV SOLN
100.0000 mg | Freq: Every day | INTRAVENOUS | Status: DC
  Administered 2020-10-25 – 2020-10-26 (×2): 100 mg via INTRAVENOUS
  Filled 2020-10-24 (×2): qty 20

## 2020-10-24 MED ORDER — SODIUM CHLORIDE 0.9 % IV BOLUS
1000.0000 mL | Freq: Once | INTRAVENOUS | Status: AC
  Administered 2020-10-24: 1000 mL via INTRAVENOUS

## 2020-10-24 MED ORDER — ONDANSETRON HCL 4 MG/2ML IJ SOLN: 4.0000 mg | Freq: Four times a day (QID) | INTRAMUSCULAR | Status: DC | PRN

## 2020-10-24 MED ORDER — PANTOPRAZOLE SODIUM 40 MG PO TBEC
40.0000 mg | DELAYED_RELEASE_TABLET | Freq: Every day | ORAL | Status: DC
  Administered 2020-10-24 – 2020-10-26 (×3): 40 mg via ORAL
  Filled 2020-10-24 (×3): qty 1

## 2020-10-24 MED ORDER — HYDROCODONE-ACETAMINOPHEN 10-325 MG PO TABS
1.0000 | ORAL_TABLET | Freq: Four times a day (QID) | ORAL | Status: DC | PRN
  Administered 2020-10-25: 1 via ORAL
  Filled 2020-10-24: qty 1

## 2020-10-24 MED ORDER — IRBESARTAN 75 MG PO TABS
75.0000 mg | ORAL_TABLET | Freq: Every day | ORAL | Status: DC
  Administered 2020-10-25 – 2020-10-26 (×2): 75 mg via ORAL
  Filled 2020-10-24 (×3): qty 1

## 2020-10-24 MED ORDER — HYDROCOD POLST-CPM POLST ER 10-8 MG/5ML PO SUER
5.0000 mL | Freq: Two times a day (BID) | ORAL | Status: DC | PRN
Start: 2020-10-24 — End: 2020-10-26

## 2020-10-24 MED ORDER — ATORVASTATIN CALCIUM 40 MG PO TABS
40.0000 mg | ORAL_TABLET | Freq: Every day | ORAL | Status: DC
  Administered 2020-10-25 – 2020-10-26 (×2): 40 mg via ORAL
  Filled 2020-10-24 (×2): qty 1

## 2020-10-24 MED ORDER — SODIUM CHLORIDE 0.9 % IV SOLN
200.0000 mg | Freq: Once | INTRAVENOUS | Status: AC
  Administered 2020-10-24: 200 mg via INTRAVENOUS
  Filled 2020-10-24: qty 40

## 2020-10-24 MED ORDER — ZINC SULFATE 220 (50 ZN) MG PO CAPS
220.0000 mg | ORAL_CAPSULE | Freq: Every day | ORAL | Status: DC
  Administered 2020-10-24 – 2020-10-26 (×3): 220 mg via ORAL
  Filled 2020-10-24 (×3): qty 1

## 2020-10-24 MED ORDER — POLYETHYLENE GLYCOL 3350 17 G PO PACK: 17.0000 g | PACK | Freq: Every day | ORAL | Status: DC | PRN

## 2020-10-24 MED ORDER — ALPRAZOLAM 0.5 MG PO TABS
0.5000 mg | ORAL_TABLET | Freq: Three times a day (TID) | ORAL | Status: DC | PRN
  Administered 2020-10-24 – 2020-10-25 (×3): 0.5 mg via ORAL
  Filled 2020-10-24 (×3): qty 1

## 2020-10-24 MED ORDER — SODIUM CHLORIDE 0.9 % IV SOLN: 200.0000 mg | Freq: Once | INTRAVENOUS | Status: DC

## 2020-10-24 MED ORDER — ENOXAPARIN SODIUM 40 MG/0.4ML ~~LOC~~ SOLN
40.0000 mg | SUBCUTANEOUS | Status: DC
  Administered 2020-10-24 – 2020-10-25 (×2): 40 mg via SUBCUTANEOUS
  Filled 2020-10-24 (×2): qty 0.4

## 2020-10-24 MED ORDER — SODIUM CHLORIDE 0.9 % IV SOLN: INTRAVENOUS | Status: DC

## 2020-10-24 MED ORDER — SODIUM CHLORIDE 0.9 % IV SOLN: 100.0000 mg | Freq: Every day | INTRAVENOUS | Status: DC

## 2020-10-24 MED ORDER — METHYLPREDNISOLONE SODIUM SUCC 40 MG IJ SOLR
40.0000 mg | Freq: Two times a day (BID) | INTRAMUSCULAR | Status: DC
  Administered 2020-10-24 – 2020-10-26 (×4): 40 mg via INTRAVENOUS
  Filled 2020-10-24 (×4): qty 1

## 2020-10-24 NOTE — ED Notes (Signed)
Attempted report x1. 

## 2020-10-24 NOTE — ED Provider Notes (Signed)
[facility] EMERGENCY DEPARTMENT Provider Note   CSN: [id number] Arrival date & time:       History Weakness, COVID   [name] is a 65 y.o. female with past medical history significant for chronic back pain, chronic headache, hypertension who presents for evaluation of weakness.  Patient had exposure to 2 family numbers with COVID on Saturday, 5 days PTA.  Did a telehealth visit 2 days ago, diagnosed with COVID.  Initially had some congestion, rhinorrhea, sore throat and chills.  States she has some chronic shortness of breath which has not worsened.  Family called 73 today due to patient's worsening mentation.  States she has not gotten out of bed over the last 2 days.  Family states she has had worsening confusion.  Normally ANO x4. She denies headache, lightheadedness, dizziness, chest pain, shortness of breath abdominal pain, diarrhea, dysuria. Decreased appetite, little PO intake. States she does have lower back pain however states this is chronic. Denies additional aggravating or alleviating factors.  History obtained from patient and past medical records. No interpretor was used.  Pfizer COVID vaccines x 2  PCP- [facility]  Per EMS, Patient febrile, tachycardic, confused, no focal deficits on neuro exam.  Given Tylenol.  Collateral from Teanna Carli at [phone]   COVID Dx 11/23/19, Saturday Sx started, Exposure in Family, Martin Majestic to sisters on Monday for family to take care of her. Husband has been video chatting with patient as he is not COVID positive. States she is has been increasingly confused over last 2 days. This morning was not able to follow commands on the cell phone, she is not usually like this. Normally AxO x 4. Little appetite.  No recent falls or injuries  Takes benzo and Norco daily for chronic pain and anxiety, Per husband appropriate number in bottle  Level 5 caveat- AMS  HPI    Past Medical History:  Diagnosis Date  . Anemia    . ANXIETY 06/10/2007  . Cataract   . Chronic headaches   . Degenerative arthritis of spine    back and neck  . Depression   . DYSLIPIDEMIA 06/28/2008  . Esophageal stricture   . Fibromyalgia   . Gallstones   . GERD 06/10/2007  . Hiatal hernia   . 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 HYPERTENSION 06/10/2007  . Irritable bowel syndrome 06/28/2008  . OSTEOARTHRITIS 06/10/2007  . Skin cancer    basal cell  . Tubular adenoma of colon     Patient Active Problem List   Diagnosis Date Noted  . COVID-19 virus infection 10/24/2020  . Hypertensive retinopathy 10/23/2019  . Chronic narcotic use 09/20/2018  . Tubular adenoma of colon   . Fibromyalgia   . Chronic pain syndrome 04/27/2018  . Primary osteoarthritis of right knee 03/31/2016  . Osteopenia after menopause 08/17/2014  . Panic disorder without agoraphobia 07/18/2014  . PTSD (post-traumatic stress disorder) 03/13/2014  . MDD (major depressive disorder) 03/13/2014  . GAD (generalized anxiety disorder) 03/13/2014  . Cervical spondylosis without myelopathy 12/02/2012  . Lumbosacral spondylosis without myelopathy 06/07/2012  . Lumbar facet arthropathy 02/05/2012  . Hypothyroidism 08/05/2011  . Hyperlipidemia 06/28/2008  . Irritable bowel syndrome 06/28/2008  . Essential hypertension 06/10/2007  . GERD 06/10/2007    Past Surgical History:  Procedure Laterality Date  . ABDOMINAL HYSTERECTOMY  1999  . bunions removed  1988  . C4 C5 cage insertion  06/28/2013  . CHOLECYSTECTOMY  04/25/2008  . EDG  12/17/2004  . ELECTROCARDIOGRAM  05/27/2007  . L5 S1 rod insertion  11/07/2012  . SKIN CANCER EXCISION    . SKIN CANCER EXCISION     several  . TONSILLECTOMY  1981  . TUBAL LIGATION  1997     OB History   No obstetric history on file.     Family History  Problem Relation Age of Onset  . Cervical cancer Mother   . Diabetes Mother   . Stroke Mother   . Alzheimer's disease Mother   . Heart attack Father   . Stroke Father   . Colon cancer Father    . Anxiety disorder Maternal Aunt   . Depression Maternal Aunt   . Anxiety disorder Maternal Uncle   . Depression Maternal Uncle        suicide attempt by gun shot wound to head. Survived  . Alcohol abuse Other   . Drug abuse Other   . Breast cancer Maternal Grandmother   . Diabetes Maternal Grandmother   . Breast cancer Paternal Aunt   . Colon polyps Sister        x 2    Social History   Tobacco Use  . Smoking status: Never Smoker  . Smokeless tobacco: Never Used  Vaping Use  . Vaping Use: Never used  Substance Use Topics  . Alcohol use: No    Alcohol/week: 0.0 standard drinks  . Drug use: No    Home Medications Prior to Admission medications   Medication Sig Start Date End Date Taking? Authorizing Provider  acyclovir (ZOVIRAX) 800 MG tablet TAKE 1 TABLET (800 MG TOTAL) BY MOUTH DAILY. 03/04/20   Leamon Arnt, MD  ALPRAZolam Duanne Moron) 1 MG tablet TAKE 1/2 TAB BY MOUTH 3 TIMES A DAY AS NEEDED FOR ANXIETY 10/15/20   Bayard Hugger, NP  ARIPiprazole (ABILIFY) 2 MG tablet Take 1 tablet (2 mg total) by mouth daily. 07/17/20   Bayard Hugger, NP  atorvastatin (LIPITOR) 40 MG tablet  01/01/20   [provider]  cyclobenzaprine (FLEXERIL) 5 MG tablet TAKE 1 TABLET (5 MG TOTAL) BY MOUTH AT BEDTIME AS NEEDED FOR MUSCLE SPASMS. THREE MONTH SUPPLY 05/14/20   Bayard Hugger, NP  HYDROcodone-acetaminophen (NORCO) 10-325 MG tablet Take 1 tablet by mouth every 6 (six) hours as needed. 10/15/20   Bayard Hugger, NP  irbesartan (AVAPRO) 75 MG tablet Take 1 tablet (75 mg total) by mouth daily. 10/24/19   Leamon Arnt, MD  methylphenidate (RITALIN) 10 MG tablet Take 1 tablet (10 mg total) by mouth 2 (two) times daily with breakfast and lunch. [id number] 10/15/20   Bayard Hugger, NP  omeprazole (PRILOSEC) 40 MG capsule Take 40 mg by mouth 2 (two) times daily. 11/21/18   [provider]  propranolol (INDERAL) 40 MG tablet TAKE 1 TABLET BY MOUTH TWICE A DAY 10/18/20   Leamon Arnt, MD  RESTASIS 0.05 % ophthalmic emulsion  08/31/20   [provider]  Venlafaxine HCl 225 MG TB24 Take by mouth. 06/04/20   [provider]  venlafaxine XR (EFFEXOR-XR) 150 MG 24 hr capsule One capsule daily with breakfast. 12/20/19   Bayard Hugger, NP    Allergies    Gabapentin, Poison ivy extract [poison ivy extract], Poison oak extract [poison oak extract], and Poison sumac extract  Review of Systems   Review of Systems  Unable to perform ROS: Mental status change  Constitutional: Positive for activity change, appetite change and fatigue.  HENT: Negative.   Respiratory: Positive  for cough and shortness of breath (Chronic).   Cardiovascular: Negative.   Gastrointestinal: Negative.   Musculoskeletal: Positive for back pain (Chronic) and myalgias.  Skin: Negative.   Psychiatric/Behavioral: Positive for confusion.  All other systems reviewed and are negative.  Physical Exam Updated Vital Signs BP (!) 151/66   Pulse 99   Temp 99.7 F (37.6 C) (Oral)   Resp 19   Ht 5\' 1"  (1.549 m)   Wt 79 kg   SpO2 98%   BMI 32.91 kg/m   Physical Exam Vitals and nursing note reviewed.  Constitutional:      General: She is not in acute distress.    Appearance: She is well-developed and well-nourished. She is ill-appearing.  HENT:     Head: Normocephalic and atraumatic.     Jaw: There is normal jaw occlusion.     Mouth/Throat:     Mouth: Mucous membranes are dry.     Pharynx: Oropharynx is clear.     Comments: Dry mucous membranes, peeling, crusting to lips. Eyes:     Pupils: Pupils are equal, round, and reactive to light.     Comments: PERRLA, EOM intact  Neck:     Trachea: Trachea and phonation normal. No tracheal tenderness.     Comments: Full range of motion without difficulty Cardiovascular:     Rate and Rhythm: Tachycardia present.     Pulses: Intact distal pulses.          Radial pulses are 2+ on the right side and 2+ on the left side.        Dorsalis pedis pulses are 2+ on the right side and 2+ on the left side.     Heart sounds: Normal heart sounds.  Pulmonary:     Effort: Pulmonary effort is normal. No respiratory distress.     Comments: Course lung sounds throughout Chest:     Comments: Equal rise and fall to chest wall Abdominal:     General: Bowel sounds are normal. There is no distension.     Palpations: Abdomen is soft.     Tenderness: There is no abdominal tenderness. There is no right CVA tenderness, left CVA tenderness, guarding or rebound.     Hernia: No hernia is present.     Comments: Soft, nontender  Musculoskeletal:        General: Normal range of motion.     Cervical back: Full passive range of motion without pain and normal range of motion.     Comments: Moves all 4 extremities without difficulty. No midline spinal tenderness  Skin:    General: Skin is warm and dry.     Capillary Refill: Capillary refill takes less than 2 seconds.     Comments: No rashes or lesions  Neurological:     Mental Status: She is confused.     Cranial Nerves: No facial asymmetry.     Motor: Weakness present. No tremor.     Coordination: Romberg sign negative. Coordination normal. Finger-Nose-Finger Test and Heel to [facility] Test normal. Rapid alternating movements normal.     Comments: CN 2-12 grossly intact Intact sensation 4/5 strength throughout Alert to person and place only. Year- 2005, Does not know month, president  Psychiatric:        Mood and Affect: Mood and affect normal.    ED Results / Procedures / Treatments   Labs (all labs ordered are listed, but only abnormal results are displayed) Labs Reviewed  URINALYSIS, ROUTINE W REFLEX MICROSCOPIC - Abnormal; Notable  for the following components:      Result Value   APPearance HAZY (*)    Hgb urine dipstick MODERATE (*)    Ketones, ur 80 (*)    Protein, ur 30 (*)    Nitrite POSITIVE (*)    Bacteria, UA MANY (*)    All other components within normal limits  RAPID  URINE DRUG SCREEN, HOSP PERFORMED - Abnormal; Notable for the following components:   Opiates POSITIVE (*)    Benzodiazepines POSITIVE (*)    All other components within normal limits  AMMONIA - Abnormal; Notable for the following components:   Ammonia 36 (*)    All other components within normal limits  CBC WITH DIFFERENTIAL/PLATELET - Abnormal; Notable for the following components:   Lymphs Abs 0.3 (*)    All other components within normal limits  COMPREHENSIVE METABOLIC PANEL - Abnormal; Notable for the following components:   Potassium 3.2 (*)    CO2 21 (*)    Glucose, Bld 115 (*)    Calcium 8.6 (*)    Total Protein 6.2 (*)    All other components within normal limits  D-DIMER, QUANTITATIVE (NOT AT [facility]) - Abnormal; Notable for the following components:   D-Dimer, Quant 0.97 (*)    All other components within normal limits  FIBRINOGEN - Abnormal; Notable for the following components:   Fibrinogen 675 (*)    All other components within normal limits  C-REACTIVE PROTEIN - Abnormal; Notable for the following components:   CRP 24.0 (*)    All other components within normal limits  APTT - Abnormal; Notable for the following components:   aPTT 46 (*)    All other components within normal limits  URINE CULTURE  PROCALCITONIN  LACTATE DEHYDROGENASE  FERRITIN  TRIGLYCERIDES  PROTIME-INR    EKG EKG Interpretation  Date/Time:  Thursday October 24 2020 12:38:40 EST Ventricular Rate:  120 PR Interval:    QRS Duration: 90 QT Interval:  331 QTC Calculation: 468 R Axis:   23 Text Interpretation: Sinus tachycardia Borderline repol abnormality, diffuse leads Confirmed by Elnora Morrison [phone]) on 10/24/2020 3:44:06 PM   Radiology CT Head Wo Contrast  Result Date: 10/24/2020 CLINICAL DATA:  Delirium, altered mental status. EXAM: CT HEAD WITHOUT CONTRAST TECHNIQUE: Contiguous axial images were obtained from the base of the skull through the vertex without intravenous contrast.  COMPARISON:  Brain MRI 09/25/2015 FINDINGS: Brain: Mild generalized atrophy. No intracranial hemorrhage, mass effect, or midline shift. No hydrocephalus. The basilar cisterns are patent. Periventricular and deep white matter hypodensity typical of chronic small vessel ischemia, similar to prior exam allowing for differences in modality. No evidence of territorial infarct or acute ischemia. No extra-axial or intracranial fluid collection. Vascular: No hyperdense vessel. Skull: No fracture or focal lesion. Sinuses/Orbits: Mild diffuse mucosal thickening with small fluid levels in the maxillary sinuses and right sphenoid sinus. Mastoid air cells are clear. No acute orbital abnormality. Bilateral cataract resection. Other: None. IMPRESSION: 1. No acute intracranial abnormality. 2. Generalized atrophy and chronic small vessel ischemia. 3. Paranasal sinus disease with small fluid levels in the maxillary sinuses and right sphenoid sinus, can be seen with acute sinusitis in the appropriate clinical setting. Electronically Signed   By: Keith Rake M.D.   On: 10/24/2020 15:57   DG Chest Port 1 View  Result Date: 10/24/2020 CLINICAL DATA:  COVID positive. EXAM: PORTABLE CHEST 1 VIEW COMPARISON:  06/12/2015 FINDINGS: 1223 hours. Low volumes with asymmetric elevation right hemidiaphragm. The cardio pericardial silhouette  is enlarged. Interstitial markings are diffusely coarsened with chronic features. No focal consolidation, edema, or pleural effusion. Telemetry leads overlie the chest. IMPRESSION: Low volume film without acute cardiopulmonary findings. Electronically Signed   By: Misty Stanley M.D.   On: 10/24/2020 12:35    (ABNORMAL) POCT CoVid-19 nucleic acid (10/22/2020 2:55 PM EST) (ABNORMAL) POCT CoVid-19 nucleic acid (10/22/2020 2:55 PM EST)  Component Value Ref Range Test Method Analysis Time Performed At Pathologist Signature  SARS-COV-2, NAA Positive (A) Negative   [facility]      Procedures Procedures (including critical care time)  Medications Ordered in ED Medications  0.9 %  sodium chloride infusion (has no administration in time range)  cefTRIAXone (ROCEPHIN) 1 g in sodium chloride 0.9 % 100 mL IVPB (has no administration in time range)  sodium chloride 0.9 % bolus 1,000 mL (0 mLs Intravenous Stopped 10/24/20 1444)    ED Course  I have reviewed the triage vital signs and the nursing notes.  Pertinent labs & imaging results that were available during my care of the patient were reviewed by me and considered in my medical decision making (see chart for details).  65 year old known COVID-positive presents for evaluation of altered mental status. Low grade temp, non septic however ill appearing. Per family general decline over last 2 days.  Only alert to person, place.  Oriented x4.  Has not been out of bed in 2 days.  Little p.o. intake.  No focal deficits on exam. Follows commands.  Heart clear.  Coarse lung sounds.  Abdomen soft, nontender no clinical evidence of DVT on exam.  She is tachycardic.  Low-grade temperature, given Tylenol by EMS just prior to arrival. No UA complaints, rashes or lesions. Hold on Code sepsis, stoke given known COVID, outside stroke window and no focal deficits on exam.  Will give fluid bolus, labs and imaging and reassess.  Labs and imaging personally reviewed and interpreted:  CBC without leukocytosis CMP mild hypokalemia at 3.2, CO2 21, glucose 115, no additional ultralight, renal or liver abnormality Lactic BC pending Ammonia 36 INR 1.2 UA  Positive for UTI, sent for culture UDS for opiates, benzos, per database patient has prescriptions for both these medications.  Followed by outpatient PCP with regular drug testing.  Patient denies any increased use of these medications Ddimer 0.97 DG chest without acute abnormality  EKG without ischemic changes, does have sinus tachycardia  Patient reassessed. Looks significantly  better on reassessment. Knows year now, still unsure of month, recent passed holidays. Pending UA and Head CT.  Went to reassess patient.  States she still feels generally weak her mentation has improved with some IV fluids.  CT head reassuring.  I did attempt to sit patient on edge of bed to attempt ambulation trial and her heart rate went up from 120-130's with minimal movement. No significant tachypnea. Given initial altered mental status, COVID, tachycardia will admit.  Feel patient's heart rate and likely due to some degree of dehydration.    Urine does show UTI.  Question metabolic encephalopathy from her urinary tract infection.  Was given IV antibiotics.  CONSULT with Dr. Sloan Leiter with [facility] who agrees to evaluate patient for admission.  The patient appears reasonably stabilized for admission considering the current resources, flow, and capabilities available in the ED at this time, and I doubt any other [facility] requiring further screening and/or treatment in the ED prior to admission.  Patient discussed with attending, Dr. Reather Converse who agrees with above treatment,  plan and disposition.    MDM Rules/Calculators/A&P                          [place] was evaluated in Emergency Department on 10/24/2020 for the symptoms described in the history of present illness. She was evaluated in the context of the global COVID-19 pandemic, which necessitated consideration that the patient might be at risk for infection with the SARS-CoV-2 virus that causes COVID-19. Institutional protocols and algorithms that pertain to the evaluation of patients at risk for COVID-19 are in a state of rapid change based on information released by regulatory bodies including the CDC and federal and state organizations. These policies and algorithms were followed during the patient's care in the ED. Final Clinical Impression(s) / ED Diagnoses Final diagnoses:  COVID  Altered mental status, unspecified altered mental status  type  Acute cystitis with hematuria  Tachycardia    Rx / DC Orders ED Discharge Orders    None       Ravinder Lukehart A, PA-C 10/24/20 1653    Elnora Morrison, MD 10/29/20 1422

## 2020-10-24 NOTE — H&P (Signed)
HISTORY AND PHYSICAL       PATIENT DETAILS Name: [name] Age: 65 y.o. Sex: female Date of Birth: [birth year] Admit Date: 10/24/2020 [id number], Shanon Brow, MD   Patient coming from: Home   CHIEF COMPLAINT:  Fever/cough/myalgia/confusion  HPI: [name] is a 65 y.o. female with medical history significant of hypertension, depression/fibromyalgia disorder presented to the ED for evaluation of the above-noted complaints.  Patient had exposure to her sisters with COVID this past Saturday-since Sunday-patient started developing sore throat, congestion, rhinorrhea as-she then started having fever and myalgias.  She also started having persistent cough.  She subsequently was tested for COVID-19 on 1/13 and was positive.  Since then-her mentation has gradually worsened.  She does acknowledge some mild shortness of breath with activity.  She has had poor oral intake over the past few days as well.  Due to persistence cough-confusion-she was brought to the ED-and the hospitalist service was asked to admit this patient for further evaluation and treatment.   Note: Lives at: Home Mobility: Independent Chronic Indwelling Foley:no   REVIEW OF SYSTEMS:  Constitutional:   No  weight loss, night sweats  HEENT:    No headaches, Dysphagia,Tooth/dental problems  Cardio-vascular: No chest pain,Orthopnea, PND,lower extremity edema, anasarca, palpitations  GI:  No heartburn, indigestion, abdominal pain, nausea, vomiting, diarrhea, melena or hematochezia  Resp: No  hemoptysis,plueritic chest pain.   Skin:  No rash or lesions.  GU:  No dysuria, change in color of urine, no urgency or frequency.  No flank pain.  Musculoskeletal: No joint pain or swelling.  No decreased range of motion.  No back pain.  Endocrine: No heat intolerance, no cold intolerance, no polyuria, no polydipsia  Psych: No change in mood or affect. No depression or anxiety.     ALLERGIES:    Allergies  Allergen Reactions  . Gabapentin Other (See Comments)    Bad headaches  . Poison Ivy Extract [Poison Ivy Extract] Rash  . Poison Oak Extract [Poison Oak Extract] Rash  . Poison Sumac Extract Rash    PAST MEDICAL HISTORY: Past Medical History:  Diagnosis Date  . Anemia   . ANXIETY 06/10/2007  . Cataract   . Chronic headaches   . Degenerative arthritis of spine    back and neck  . Depression   . DYSLIPIDEMIA 06/28/2008  . Esophageal stricture   . Fibromyalgia   . Gallstones   . GERD 06/10/2007  . Hiatal hernia   . 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 HYPERTENSION 06/10/2007  . Irritable bowel syndrome 06/28/2008  . OSTEOARTHRITIS 06/10/2007  . Skin cancer    basal cell  . Tubular adenoma of colon     PAST SURGICAL HISTORY: Past Surgical History:  Procedure Laterality Date  . ABDOMINAL HYSTERECTOMY  1999  . bunions removed  1988  . C4 C5 cage insertion  06/28/2013  . CHOLECYSTECTOMY  04/25/2008  . EDG  12/17/2004  . ELECTROCARDIOGRAM  05/27/2007  . L5 S1 rod insertion  11/07/2012  . SKIN CANCER EXCISION    . SKIN CANCER EXCISION     several  . TONSILLECTOMY  1981  . TUBAL LIGATION  1997    MEDICATIONS AT HOME: Prior to Admission medications   Medication Sig Start Date End Date Taking? Authorizing Provider  acyclovir (ZOVIRAX) 800 MG tablet TAKE 1 TABLET (800 MG TOTAL) BY MOUTH DAILY. 03/04/20   Leamon Arnt, MD  ALPRAZolam Duanne Moron) 1 MG tablet TAKE 1/2 TAB BY MOUTH  3 TIMES A DAY AS NEEDED FOR ANXIETY 10/15/20   Bayard Hugger, NP  ARIPiprazole (ABILIFY) 2 MG tablet Take 1 tablet (2 mg total) by mouth daily. 07/17/20   Bayard Hugger, NP  atorvastatin (LIPITOR) 40 MG tablet  01/01/20   [provider]  cyclobenzaprine (FLEXERIL) 5 MG tablet TAKE 1 TABLET (5 MG TOTAL) BY MOUTH AT BEDTIME AS NEEDED FOR MUSCLE SPASMS. THREE MONTH SUPPLY 05/14/20   Bayard Hugger, NP  HYDROcodone-acetaminophen (NORCO) 10-325 MG tablet Take 1 tablet by mouth every 6 (six) hours as needed.  10/15/20   Bayard Hugger, NP  irbesartan (AVAPRO) 75 MG tablet Take 1 tablet (75 mg total) by mouth daily. 10/24/19   Leamon Arnt, MD  methylphenidate (RITALIN) 10 MG tablet Take 1 tablet (10 mg total) by mouth 2 (two) times daily with breakfast and lunch. [id number] 10/15/20   Bayard Hugger, NP  omeprazole (PRILOSEC) 40 MG capsule Take 40 mg by mouth 2 (two) times daily. 11/21/18   [provider]  propranolol (INDERAL) 40 MG tablet TAKE 1 TABLET BY MOUTH TWICE A DAY 10/18/20   Leamon Arnt, MD  RESTASIS 0.05 % ophthalmic emulsion  08/31/20   [provider]  Venlafaxine HCl 225 MG TB24 Take by mouth. 06/04/20   [provider]  venlafaxine XR (EFFEXOR-XR) 150 MG 24 hr capsule One capsule daily with breakfast. 12/20/19   Bayard Hugger, NP    FAMILY HISTORY: Family History  Problem Relation Age of Onset  . Cervical cancer Mother   . Diabetes Mother   . Stroke Mother   . Alzheimer's disease Mother   . Heart attack Father   . Stroke Father   . Colon cancer Father   . Anxiety disorder Maternal Aunt   . Depression Maternal Aunt   . Anxiety disorder Maternal Uncle   . Depression Maternal Uncle        suicide attempt by gun shot wound to head. Survived  . Alcohol abuse Other   . Drug abuse Other   . Breast cancer Maternal Grandmother   . Diabetes Maternal Grandmother   . Breast cancer Paternal Aunt   . Colon polyps Sister        x 2      SOCIAL HISTORY:  reports that she has never smoked. She has never used smokeless tobacco. She reports that she does not drink alcohol and does not use drugs.  PHYSICAL EXAM: Blood pressure (!) 151/66, pulse 99, temperature 99.7 F (37.6 C), temperature source Oral, resp. rate 19, height 5\' 1"  (1.549 m), weight 79 kg, SpO2 98 %.  General appearance :Awake, alert, not in any distress.  Eyes:, pupils equally reactive to light and accomodation,no scleral icterus.Pink conjunctiva HEENT: Atraumatic and  Normocephalic Neck: supple, no JVD.  Resp:Good air entry bilaterally, no added sounds  CVS: S1 S2 regular, no murmurs.  GI: Bowel sounds present, Non tender and not distended with no gaurding, rigidity or rebound. Extremities: B/L Lower Ext shows no edema, both legs are warm to touch Neurology:  speech clear,Non focal, sensation is grossly intact. Musculoskeletal:gait appears to be normal.No digital cyanosis Skin:No Rash, warm and dry Wounds:N/A  LABS ON ADMISSION:  I have personally reviewed following labs and imaging studies  CBC: Recent Labs  Lab 10/24/20 1239  WBC 4.7  NEUTROABS 3.9  HGB 12.7  HCT 37.7  MCV 83.6  PLT 629    Basic Metabolic Panel: Recent Labs  Lab 10/24/20 1239  NA 136  K 3.2*  CL 102  CO2 21*  GLUCOSE 115*  BUN 11  CREATININE 0.79  CALCIUM 8.6*    GFR: Estimated Creatinine Clearance: 68.5 mL/min (by C-G formula based on SCr of 0.79 mg/dL).  Liver Function Tests: Recent Labs  Lab 10/24/20 1239  AST 36  ALT 39  ALKPHOS 69  BILITOT 0.7  PROT 6.2*  ALBUMIN 3.5   No results for input(s): LIPASE, AMYLASE in the last 168 hours. Recent Labs  Lab 10/24/20 1239  AMMONIA 36*    Coagulation Profile: Recent Labs  Lab 10/24/20 1239  INR 1.2    Cardiac Enzymes: No results for input(s): CKTOTAL, CKMB, CKMBINDEX, TROPONINI in the last 168 hours.  BNP (last 3 results) No results for input(s): PROBNP in the last 8760 hours.  HbA1C: No results for input(s): HGBA1C in the last 72 hours.  CBG: No results for input(s): GLUCAP in the last 168 hours.  Lipid Profile: Recent Labs    10/24/20 1239  TRIG 93    Thyroid Function Tests: No results for input(s): TSH, T4TOTAL, FREET4, T3FREE, THYROIDAB in the last 72 hours.  Anemia Panel: Recent Labs    10/24/20 1239  FERRITIN 241    Urine analysis:    Component Value Date/Time   COLORURINE YELLOW 10/24/2020 1223   APPEARANCEUR HAZY (A) 10/24/2020 1223   LABSPEC 1.020 10/24/2020  1223   PHURINE 5.0 10/24/2020 1223   GLUCOSEU NEGATIVE 10/24/2020 1223   GLUCOSEU NEGATIVE 09/20/2018 1423   HGBUR MODERATE (A) 10/24/2020 1223   BILIRUBINUR NEGATIVE 10/24/2020 1223   KETONESUR 80 (A) 10/24/2020 1223   PROTEINUR 30 (A) 10/24/2020 1223   UROBILINOGEN 1.0 09/20/2018 1423   NITRITE POSITIVE (A) 10/24/2020 1223   LEUKOCYTESUR NEGATIVE 10/24/2020 1223    Sepsis Labs: Lactic Acid, Venous No results found for: [place]   Microbiology: No results found for this or any previous visit (from the past 240 hour(s)).    RADIOLOGIC STUDIES ON ADMISSION: CT Head Wo Contrast  Result Date: 10/24/2020 CLINICAL DATA:  Delirium, altered mental status. EXAM: CT HEAD WITHOUT CONTRAST TECHNIQUE: Contiguous axial images were obtained from the base of the skull through the vertex without intravenous contrast. COMPARISON:  Brain MRI 09/25/2015 FINDINGS: Brain: Mild generalized atrophy. No intracranial hemorrhage, mass effect, or midline shift. No hydrocephalus. The basilar cisterns are patent. Periventricular and deep white matter hypodensity typical of chronic small vessel ischemia, similar to prior exam allowing for differences in modality. No evidence of territorial infarct or acute ischemia. No extra-axial or intracranial fluid collection. Vascular: No hyperdense vessel. Skull: No fracture or focal lesion. Sinuses/Orbits: Mild diffuse mucosal thickening with small fluid levels in the maxillary sinuses and right sphenoid sinus. Mastoid air cells are clear. No acute orbital abnormality. Bilateral cataract resection. Other: None. IMPRESSION: 1. No acute intracranial abnormality. 2. Generalized atrophy and chronic small vessel ischemia. 3. Paranasal sinus disease with small fluid levels in the maxillary sinuses and right sphenoid sinus, can be seen with acute sinusitis in the appropriate clinical setting. Electronically Signed   By: Keith Rake M.D.   On: 10/24/2020 15:57   DG Chest Port 1  View  Result Date: 10/24/2020 CLINICAL DATA:  COVID positive. EXAM: PORTABLE CHEST 1 VIEW COMPARISON:  06/12/2015 FINDINGS: 1223 hours. Low volumes with asymmetric elevation right hemidiaphragm. The cardio pericardial silhouette is enlarged. Interstitial markings are diffusely coarsened with chronic features. No focal consolidation, edema, or pleural effusion. Telemetry leads overlie the chest. IMPRESSION: Low volume film without  acute cardiopulmonary findings. Electronically Signed   By: Misty Stanley M.D.   On: 10/24/2020 12:35    I have personally reviewed images of chest xray-no PNA  EKG:  Personally reviewed-sinus tachycardia  ASSESSMENT AND PLAN: Acute metabolic encephalopathy: In the setting of dehydration, COVID-19 infection-Per ED PA-C already better than how she first presented to the hospital.  Patient-is mostly awake/alert-and oriented during my exam-at times she seems forgetful but can be easily redirected.  CT head negative.  Anticipate improvement as underlying dehydration/COVID-19 infection improves.  COVID-19 infection: Vaccinated x2 but not boosted-has persistent coughing spells-no obvious pneumonia on x-ray-not hypoxic- however with significantly elevated CRP.  She also appears to have some rhonchi intermittently on exam-suspect she has underlying reactive airway disease due to airway inflammation due to COVID-19.  Suspect she may have small pneumonia that is not seen on imaging studies.  Plan is to start Remdesivir-bronchodilators-and a short course of steroids.  Repeat chest x-ray in a.m.  HTN-resume Avapro  HLD: Resume Lipitor-follow and adjust  Depression/PTSD/anxiety: We will resume Xanax as needed for now-hold Abilify/Effexor-till mentation improves further.  Also-medication reconciliation still pending by pharmacy.  Further plan will depend as patient's clinical course evolves and further radiologic and laboratory data become available. Patient will be monitored  closely.  Above noted plan was discussed with patient's spouse-Jeff [phone] )over the phone.  CONSULTS: None   DVT Prophylaxis: Prophylactic Lovenox  Code Status: Full Code  Disposition Plan:  Discharge back home possibly in 2-3 days, depending on clinical course  Admission status: Inpatient  going to tele   The medical decision making on this patient was of high complexity and the patient is at high risk for clinical deterioration, therefore this is a level 3 visit.    Total time spent  55 minutes.Greater than 50% of this time was spent in counseling, explanation of diagnosis, planning of further management, and coordination of care.  Severity of illness: The appropriate patient status for this patient is INPATIENT. Inpatient status is judged to be reasonable and necessary in order to provide the required intensity of service to ensure the patient's safety. The patient's presenting symptoms, physical exam findings, and initial radiographic and laboratory data in the context of their chronic comorbidities is felt to place them at high risk for further clinical deterioration. Furthermore, it is not anticipated that the patient will be medically stable for discharge from the hospital within 2 midnights of admission. The following factors support the patient status of inpatient.   " The patient's presenting symptoms include fever/cough/confusion. " The worrisome physical exam findings include confusion " The initial radiographic and laboratory data are worrisome because of significantly elevated CRP, COVID-19 positive " The chronic co-morbidities include hypertension, anxiety, chronic pain   * I certify that at the point of admission it is my clinical judgment that the patient will require inpatient hospital care spanning beyond 2 midnights from the point of admission due to high intensity of service, high risk for further deterioration and high frequency of surveillance  required.  Oren Binet [facility] Pager [phone]  If 7PM-7AM, please contact night-coverage  Please page via [email or web address]  Go to [email or web address] and use [place]'s universal password to access. If you do not have the password, please contact the hospital operator.  Locate the [facility] provider you are looking for under [facility] and page to a number that you can be directly reached. If you still have difficulty reaching the provider, please page the [facility] (Director on  Call) for the Hospitalists listed on amion for assistance.  10/24/2020, 4:50 PM

## 2020-10-24 NOTE — ED Notes (Signed)
Dinner Tray Ordered @ 1733. 

## 2020-10-24 NOTE — ED Triage Notes (Signed)
Pt bib ems for ams. Pt tested positive for COVID 2 days ago, since then has become confused. Pt alert, oriented to self and place only at this time. Mucous membranes very dry, 1g tylenol given by Ems at 1130am for fever. Resp e.u at this time

## 2020-10-25 ENCOUNTER — Inpatient Hospital Stay (HOSPITAL_COMMUNITY): Payer: BC Managed Care – PPO

## 2020-10-25 ENCOUNTER — Ambulatory Visit: Payer: BC Managed Care – PPO | Admitting: Family Medicine

## 2020-10-25 DIAGNOSIS — R4182 Altered mental status, unspecified: Secondary | ICD-10-CM | POA: Diagnosis not present

## 2020-10-25 DIAGNOSIS — F411 Generalized anxiety disorder: Secondary | ICD-10-CM

## 2020-10-25 DIAGNOSIS — I10 Essential (primary) hypertension: Secondary | ICD-10-CM | POA: Diagnosis not present

## 2020-10-25 DIAGNOSIS — E782 Mixed hyperlipidemia: Secondary | ICD-10-CM | POA: Diagnosis not present

## 2020-10-25 LAB — CBC WITH DIFFERENTIAL/PLATELET
Abs Immature Granulocytes: 0.01 10*3/uL (ref 0.00–0.07)
Basophils Absolute: 0 10*3/uL (ref 0.0–0.1)
Basophils Relative: 0 %
Eosinophils Absolute: 0 10*3/uL (ref 0.0–0.5)
Eosinophils Relative: 0 %
HCT: 34.9 % — ABNORMAL LOW (ref 36.0–46.0)
Hemoglobin: 11.7 g/dL — ABNORMAL LOW (ref 12.0–15.0)
Immature Granulocytes: 0 %
Lymphocytes Relative: 11 %
Lymphs Abs: 0.5 10*3/uL — ABNORMAL LOW (ref 0.7–4.0)
MCH: 28.4 pg (ref 26.0–34.0)
MCHC: 33.5 g/dL (ref 30.0–36.0)
MCV: 84.7 fL (ref 80.0–100.0)
Monocytes Absolute: 0.3 10*3/uL (ref 0.1–1.0)
Monocytes Relative: 6 %
Neutro Abs: 4.1 10*3/uL (ref 1.7–7.7)
Neutrophils Relative %: 83 %
Platelets: 178 10*3/uL (ref 150–400)
RBC: 4.12 MIL/uL (ref 3.87–5.11)
RDW: 13.9 % (ref 11.5–15.5)
WBC: 4.9 10*3/uL (ref 4.0–10.5)
nRBC: 0 % (ref 0.0–0.2)

## 2020-10-25 LAB — COMPREHENSIVE METABOLIC PANEL
ALT: 36 U/L (ref 0–44)
AST: 35 U/L (ref 15–41)
Albumin: 3.5 g/dL (ref 3.5–5.0)
Alkaline Phosphatase: 69 U/L (ref 38–126)
Anion gap: 16 — ABNORMAL HIGH (ref 5–15)
BUN: 8 mg/dL (ref 8–23)
CO2: 16 mmol/L — ABNORMAL LOW (ref 22–32)
Calcium: 9 mg/dL (ref 8.9–10.3)
Chloride: 107 mmol/L (ref 98–111)
Creatinine, Ser: 0.75 mg/dL (ref 0.44–1.00)
GFR, Estimated: >60 mL/min (ref >60)
Glucose, Bld: 98 mg/dL (ref 70–99)
Potassium: 3.3 mmol/L — ABNORMAL LOW (ref 3.5–5.1)
Sodium: 139 mmol/L (ref 135–145)
Total Bilirubin: 0.6 mg/dL (ref 0.3–1.2)
Total Protein: 6.5 g/dL (ref 6.5–8.1)

## 2020-10-25 LAB — HIV ANTIBODY (ROUTINE TESTING W REFLEX): HIV Screen 4th Generation wRfx: NONREACTIVE

## 2020-10-25 LAB — C-REACTIVE PROTEIN: CRP: 29 mg/dL — ABNORMAL HIGH (ref <1.0)

## 2020-10-25 LAB — D-DIMER, QUANTITATIVE: D-Dimer, Quant: 0.88 ug/mL-FEU — ABNORMAL HIGH (ref 0.00–0.50)

## 2020-10-25 MED ORDER — ARIPIPRAZOLE 2 MG PO TABS
2.0000 mg | ORAL_TABLET | Freq: Every day | ORAL | Status: DC
  Administered 2020-10-25 – 2020-10-26 (×2): 2 mg via ORAL
  Filled 2020-10-25 (×2): qty 1

## 2020-10-25 MED ORDER — METHYLPHENIDATE HCL 5 MG PO TABS
10.0000 mg | ORAL_TABLET | ORAL | Status: DC
  Administered 2020-10-26: 10 mg via ORAL
  Filled 2020-10-25 (×2): qty 2

## 2020-10-25 MED ORDER — VENLAFAXINE HCL ER 150 MG PO CP24
150.0000 mg | ORAL_CAPSULE | Freq: Every day | ORAL | Status: DC
  Administered 2020-10-26: 150 mg via ORAL
  Filled 2020-10-25: qty 1

## 2020-10-25 MED ORDER — PROPRANOLOL HCL 20 MG PO TABS
40.0000 mg | ORAL_TABLET | Freq: Two times a day (BID) | ORAL | Status: DC
  Administered 2020-10-25 – 2020-10-26 (×3): 40 mg via ORAL
  Filled 2020-10-25 (×4): qty 2

## 2020-10-25 NOTE — Progress Notes (Signed)
PROGRESS NOTE                                                                                                                                                                                                             Patient Demographics:    [name], is a 65 y.o. female, DOB - 02/08/1956, NAT:[id number]  Outpatient Primary MD for the patient is Bernerd Limbo, MD   Admit date - 10/24/2020   LOS - 1  Chief Complaint  Patient presents with  . Covid Positive  . Altered Mental Status       Brief Narrative: Patient is a 65 y.o. female with PMHx of HTN, depression, fibromyalgia-presenting with several days history of fever, cough, confusion and myalgias.  She tested positive for COVID-19 on 1/13.  COVID-19 vaccinated status: Vaccinated-but not boosted  Significant Events: 1/13>> Admit to [facility] for fever/myalgias/confusion  Significant studies: 1/13>>Chest x-ray: No pneumonia 1/14>> chest x-ray: No pneumonia  COVID-19 medications: Steroids: 1/13>> Remdesivir: 1/13>>  Antibiotics: None  Microbiology data: None  Procedures: None  Consults: None  DVT prophylaxis: enoxaparin (LOVENOX) injection 40 mg Start: 10/24/20 1745    Subjective:    [name] today feels much better-she is completely awake and alert.   Assessment  & Plan :   COVID-19 infection with Acute bronchitis: No wheezing today-feels much better-continue bronchodilators-Taper steroids-we will plan on 3-day course of Remdesivir.  Suspect that if clinical improvement continues-should be able to go home on 1/15.   Fever: afebrile O2 requirements:  SpO2: 98 %  COVID-19 Labs: Recent Labs    10/24/20 1239 10/25/20 0603  DDIMER 0.97* 0.88*  FERRITIN 241  --   LDH 154  --   CRP 24.0* 29.0*    No results found for: BNP  Recent Labs  Lab 10/24/20 1239  PROCALCITON 0.18    No results found for: SARSCOV2NAA     Prone/Incentive Spirometry: encouraged incentive spirometry use 3-4/hour.  Acute metabolic encephalopathy: Due to dehydration and COVID-19 infection-significantly better--back to baseline.  CT head negative.  HTN: Controlled-continue Avapro  HLD: Continue Lipitor  Chronic pain syndrome: Chronic issue-resume home dosing of narcotics.  Anxiety/depression: Resume Abilify, venlafaxine.  Per patient she also takes Ritalin-her outpatient doctors have been slowly weaning her down.   Obesity: Estimated body mass index is 32.91 kg/m as calculated from the following:  Height as of this encounter: 5\' 1"  (1.549 m).   Weight as of this encounter: 79 kg.     ABG: No results found for: PHART, PCO2ART, PO2ART, HCO3, TCO2, ACIDBASEDEF, O2SAT  Vent Settings: N/A  Condition - Stable  Family Communication  :  Charna Archer [phone]) updated ove the phone 1/14  Code Status :  Full Code  Diet :  Diet Order            Diet Heart Room service appropriate? Yes; Fluid consistency: Thin  Diet effective now                  Disposition Plan  :   Status is: Inpatient  Remains inpatient appropriate because:Inpatient level of care appropriate due to severity of illness   Dispo: The patient is from: Home              Anticipated d/c is to: Home              Anticipated d/c date is: 1 day              Patient currently is not medically stable to d/c.  Barriers to discharge: Complete 3 days of IV Remdesivir  Antimicorbials  :    Anti-infectives (From admission, onward)   Start     Dose/Rate Route Frequency Ordered Stop   10/25/20 1000  remdesivir 100 mg in sodium chloride 0.9 % 100 mL IVPB  Status:  Discontinued       "Followed by" Linked Group Details   100 mg 200 mL/hr over 30 Minutes Intravenous Daily 10/24/20 1734 10/24/20 1736   10/25/20 1000  remdesivir 100 mg in sodium chloride 0.9 % 100 mL IVPB       "Followed by" Linked Group Details   100 mg 200 mL/hr over 30  Minutes Intravenous Daily 10/24/20 1727 10/29/20 0959   10/24/20 1730  remdesivir 200 mg in sodium chloride 0.9% 250 mL IVPB       "Followed by" Linked Group Details   200 mg 580 mL/hr over 30 Minutes Intravenous Once 10/24/20 1727 10/24/20 1859   10/24/20 1715  remdesivir 200 mg in sodium chloride 0.9% 250 mL IVPB  Status:  Discontinued       "Followed by" Linked Group Details   200 mg 580 mL/hr over 30 Minutes Intravenous Once 10/24/20 1734 10/24/20 1736   10/24/20 1645  cefTRIAXone (ROCEPHIN) 1 g in sodium chloride 0.9 % 100 mL IVPB        1 g 200 mL/hr over 30 Minutes Intravenous  Once 10/24/20 1640 10/24/20 1803      Inpatient Medications  Scheduled Meds: . albuterol  2 puff Inhalation Q6H  . vitamin C  500 mg Oral Daily  . atorvastatin  40 mg Oral Daily  . enoxaparin (LOVENOX) injection  40 mg Subcutaneous Q24H  . irbesartan  75 mg Oral Daily  . methylPREDNISolone (SOLU-MEDROL) injection  40 mg Intravenous Q12H  . pantoprazole  40 mg Oral Daily  . zinc sulfate  220 mg Oral Daily   Continuous Infusions: . sodium chloride 75 mL/hr at 10/24/20 2115  . remdesivir 100 mg in NS 100 mL 100 mg (10/25/20 1028)   PRN Meds:.acetaminophen, ALPRAZolam, chlorpheniramine-HYDROcodone, guaiFENesin-dextromethorphan, HYDROcodone-acetaminophen, ondansetron **OR** ondansetron (ZOFRAN) IV, polyethylene glycol   Time Spent in minutes  25  See all Orders from today for further details   Oren Binet M.D on 10/25/2020 at 10:35 AM  To page go to [email or web address] - use universal  password  [facility] -  Office  [phone]    Objective:   Vitals:   10/24/20 1937 10/24/20 2251 10/25/20 0352 10/25/20 0745  BP: (!) 176/66 (!) 150/58 139/68 (!) 147/78  Pulse: (!) 101 (!) 104 (!) 104 (!) 104  Resp: 20   18  Temp: 100 F (37.8 C) 99.7 F (37.6 C) 99.6 F (37.6 C) 98.7 F (37.1 C)  TempSrc: Oral Oral Oral   SpO2: 99% 98% 100% 98%  Weight:      Height:        Wt Readings from  Last 3 Encounters:  10/24/20 79 kg  10/15/20 79 kg  09/11/20 77.7 kg     Intake/Output Summary (Last 24 hours) at 10/25/2020 1035 Last data filed at 10/25/2020 0849 Gross per 24 hour  Intake 2206.7 ml  Output 900 ml  Net 1306.7 ml     Physical Exam Gen Exam:Alert awake-not in any distress HEENT:atraumatic, normocephalic Chest: B/L clear to auscultation anteriorly CVS:S1S2 regular Abdomen:soft non tender, non distended Extremities:no edema Neurology: Non focal Skin: no rash   Data Review:    CBC Recent Labs  Lab 10/24/20 1239 10/25/20 0603  WBC 4.7 4.9  HGB 12.7 11.7*  HCT 37.7 34.9*  PLT 163 178  MCV 83.6 84.7  MCH 28.2 28.4  MCHC 33.7 33.5  RDW 14.1 13.9  LYMPHSABS 0.3* 0.5*  MONOABS 0.4 0.3  EOSABS 0.0 0.0  BASOSABS 0.0 0.0    Chemistries  Recent Labs  Lab 10/24/20 1239 10/25/20 0603  NA 136 139  K 3.2* 3.3*  CL 102 107  CO2 21* 16*  GLUCOSE 115* 98  BUN 11 8  CREATININE 0.79 0.75  CALCIUM 8.6* 9.0  AST 36 35  ALT 39 36  ALKPHOS 69 69  BILITOT 0.7 0.6   ------------------------------------------------------------------------------------------------------------------ Recent Labs    10/24/20 1239  TRIG 93    Lab Results  Component Value Date   HGBA1C 5.6 09/20/2018   ------------------------------------------------------------------------------------------------------------------ No results for input(s): TSH, T4TOTAL, T3FREE, THYROIDAB in the last 72 hours.  Invalid input(s): FREET3 ------------------------------------------------------------------------------------------------------------------ Recent Labs    10/24/20 1239  FERRITIN 241    Coagulation profile Recent Labs  Lab 10/24/20 1239  INR 1.2    Recent Labs    10/24/20 1239 10/25/20 0603  DDIMER 0.97* 0.88*    Cardiac Enzymes No results for input(s): CKMB, TROPONINI, MYOGLOBIN in the last 168 hours.  Invalid input(s):  CK ------------------------------------------------------------------------------------------------------------------ No results found for: BNP  Micro Results No results found for this or any previous visit (from the past 240 hour(s)).  Radiology Reports CT Head Wo Contrast  Result Date: 10/24/2020 CLINICAL DATA:  Delirium, altered mental status. EXAM: CT HEAD WITHOUT CONTRAST TECHNIQUE: Contiguous axial images were obtained from the base of the skull through the vertex without intravenous contrast. COMPARISON:  Brain MRI 09/25/2015 FINDINGS: Brain: Mild generalized atrophy. No intracranial hemorrhage, mass effect, or midline shift. No hydrocephalus. The basilar cisterns are patent. Periventricular and deep white matter hypodensity typical of chronic small vessel ischemia, similar to prior exam allowing for differences in modality. No evidence of territorial infarct or acute ischemia. No extra-axial or intracranial fluid collection. Vascular: No hyperdense vessel. Skull: No fracture or focal lesion. Sinuses/Orbits: Mild diffuse mucosal thickening with small fluid levels in the maxillary sinuses and right sphenoid sinus. Mastoid air cells are clear. No acute orbital abnormality. Bilateral cataract resection. Other: None. IMPRESSION: 1. No acute intracranial abnormality. 2. Generalized atrophy and chronic small vessel ischemia. 3. Paranasal  sinus disease with small fluid levels in the maxillary sinuses and right sphenoid sinus, can be seen with acute sinusitis in the appropriate clinical setting. Electronically Signed   By: Keith Rake M.D.   On: 10/24/2020 15:57   DG Chest Port 1 View  Result Date: 10/25/2020 CLINICAL DATA:  Short of breath EXAM: PORTABLE CHEST 1 VIEW COMPARISON:  10/24/2020 FINDINGS: The heart size and mediastinal contours are within normal limits. Both lungs are clear. The visualized skeletal structures are unremarkable. IMPRESSION: No active disease. Electronically Signed   By:  Franchot Gallo M.D.   On: 10/25/2020 08:00   DG Chest Port 1 View  Result Date: 10/24/2020 CLINICAL DATA:  COVID positive. EXAM: PORTABLE CHEST 1 VIEW COMPARISON:  06/12/2015 FINDINGS: 1223 hours. Low volumes with asymmetric elevation right hemidiaphragm. The cardio pericardial silhouette is enlarged. Interstitial markings are diffusely coarsened with chronic features. No focal consolidation, edema, or pleural effusion. Telemetry leads overlie the chest. IMPRESSION: Low volume film without acute cardiopulmonary findings. Electronically Signed   By: Misty Stanley M.D.   On: 10/24/2020 12:35

## 2020-10-25 NOTE — Progress Notes (Signed)
New Admission Note: ? Arrival Method: Stretcher  Mental Orientation: AXOX4 Telemetry: Box 6  Assessment: Completed Skin: Refer to flowsheet IV: Right AC  Pain: 0/10 Tubes: None  Safety Measures: Safety Fall Prevention Plan discussed with patient. Admission: Completed [facility] [place] Orientation: Patient has been orientated to the room, unit and the staff. Family: None at the bedside  Orders have been reviewed and are being implemented. Will continue to monitor the patient. Call light has been placed within reach and bed alarm has been activated.  ? Milagros Loll, RN  Phone Number: [phone]

## 2020-10-25 NOTE — Plan of Care (Signed)
  Problem: Education: Goal: Knowledge of General Education information will improve Description Including pain rating scale, medication(s)/side effects and non-pharmacologic comfort measures Outcome: Progressing   

## 2020-10-26 DIAGNOSIS — R4182 Altered mental status, unspecified: Secondary | ICD-10-CM | POA: Diagnosis not present

## 2020-10-26 DIAGNOSIS — U071 COVID-19: Secondary | ICD-10-CM | POA: Diagnosis not present

## 2020-10-26 DIAGNOSIS — G894 Chronic pain syndrome: Secondary | ICD-10-CM | POA: Diagnosis not present

## 2020-10-26 DIAGNOSIS — I10 Essential (primary) hypertension: Secondary | ICD-10-CM | POA: Diagnosis not present

## 2020-10-26 LAB — CBC WITH DIFFERENTIAL/PLATELET
Abs Immature Granulocytes: 0.03 10*3/uL (ref 0.00–0.07)
Basophils Absolute: 0 10*3/uL (ref 0.0–0.1)
Basophils Relative: 0 %
Eosinophils Absolute: 0 10*3/uL (ref 0.0–0.5)
Eosinophils Relative: 0 %
HCT: 37.8 % (ref 36.0–46.0)
Hemoglobin: 12.2 g/dL (ref 12.0–15.0)
Immature Granulocytes: 1 %
Lymphocytes Relative: 13 %
Lymphs Abs: 0.8 10*3/uL (ref 0.7–4.0)
MCH: 26.9 pg (ref 26.0–34.0)
MCHC: 32.3 g/dL (ref 30.0–36.0)
MCV: 83.3 fL (ref 80.0–100.0)
Monocytes Absolute: 0.4 10*3/uL (ref 0.1–1.0)
Monocytes Relative: 6 %
Neutro Abs: 5 10*3/uL (ref 1.7–7.7)
Neutrophils Relative %: 80 %
Platelets: 227 10*3/uL (ref 150–400)
RBC: 4.54 MIL/uL (ref 3.87–5.11)
RDW: 14.1 % (ref 11.5–15.5)
WBC: 6.3 10*3/uL (ref 4.0–10.5)
nRBC: 0 % (ref 0.0–0.2)

## 2020-10-26 LAB — COMPREHENSIVE METABOLIC PANEL
ALT: 31 U/L (ref 0–44)
AST: 29 U/L (ref 15–41)
Albumin: 3.4 g/dL — ABNORMAL LOW (ref 3.5–5.0)
Alkaline Phosphatase: 59 U/L (ref 38–126)
Anion gap: 12 (ref 5–15)
BUN: 12 mg/dL (ref 8–23)
CO2: 21 mmol/L — ABNORMAL LOW (ref 22–32)
Calcium: 9.1 mg/dL (ref 8.9–10.3)
Chloride: 106 mmol/L (ref 98–111)
Creatinine, Ser: 0.8 mg/dL (ref 0.44–1.00)
GFR, Estimated: >60 mL/min (ref >60)
Glucose, Bld: 150 mg/dL — ABNORMAL HIGH (ref 70–99)
Potassium: 3.2 mmol/L — ABNORMAL LOW (ref 3.5–5.1)
Sodium: 139 mmol/L (ref 135–145)
Total Bilirubin: 0.4 mg/dL (ref 0.3–1.2)
Total Protein: 6.2 g/dL — ABNORMAL LOW (ref 6.5–8.1)

## 2020-10-26 LAB — D-DIMER, QUANTITATIVE: D-Dimer, Quant: 0.76 ug/mL-FEU — ABNORMAL HIGH (ref 0.00–0.50)

## 2020-10-26 LAB — C-REACTIVE PROTEIN: CRP: 12.3 mg/dL — ABNORMAL HIGH (ref <1.0)

## 2020-10-26 MED ORDER — POTASSIUM CHLORIDE CRYS ER 20 MEQ PO TBCR
40.0000 meq | EXTENDED_RELEASE_TABLET | Freq: Every day | ORAL | Status: DC
  Administered 2020-10-26: 40 meq via ORAL
  Filled 2020-10-26 (×2): qty 2

## 2020-10-26 MED ORDER — BENZONATATE 100 MG PO CAPS: 100.0000 mg | ORAL_CAPSULE | Freq: Four times a day (QID) | ORAL | 0 refills | Status: AC | PRN

## 2020-10-26 MED ORDER — PREDNISONE 10 MG PO TABS: ORAL_TABLET | ORAL | 0 refills | Status: DC

## 2020-10-26 MED ORDER — ALBUTEROL SULFATE HFA 108 (90 BASE) MCG/ACT IN AERS: 2.0000 | INHALATION_SPRAY | Freq: Four times a day (QID) | RESPIRATORY_TRACT | 0 refills | Status: DC | PRN

## 2020-10-26 NOTE — Plan of Care (Signed)

## 2020-10-26 NOTE — Progress Notes (Signed)
DISCHARGE NOTE HOME [name] to be discharged Home per MD order. Discussed prescriptions and follow up appointments with the patient. Prescriptions given to patient; medication list explained in detail. Patient verbalized understanding.  Skin clean, dry and intact without evidence of skin break down, no evidence of skin tears noted. IV catheter discontinued intact. Site without signs and symptoms of complications. Dressing and pressure applied. Pt denies pain at the site currently. No complaints noted.  Patient free of lines, drains, and wounds.   An After Visit Summary (AVS) was printed and given to the patient. Patient escorted via wheelchair, and discharged home via private auto.  Dolores Hoose, RN

## 2020-10-26 NOTE — Plan of Care (Signed)
  Problem: Health Behavior/Discharge Planning: Goal: Ability to manage health-related needs will improve Outcome: Progressing   Problem: Clinical Measurements: Goal: Will remain free from infection Outcome: Progressing   

## 2020-10-26 NOTE — Plan of Care (Signed)
  Problem: Education: Goal: Knowledge of General Education information will improve Description: Including pain rating scale, medication(s)/side effects and non-pharmacologic comfort measures Outcome: Adequate for Discharge   Problem: Health Behavior/Discharge Planning: Goal: Ability to manage health-related needs will improve 10/26/2020 0953 by Dolores Hoose, RN Outcome: Adequate for Discharge 10/26/2020 0739 by Dolores Hoose, RN Outcome: Progressing   Problem: Clinical Measurements: Goal: Ability to maintain clinical measurements within normal limits will improve Outcome: Adequate for Discharge Goal: Will remain free from infection 10/26/2020 0953 by Dolores Hoose, RN Outcome: Adequate for Discharge 10/26/2020 0739 by Dolores Hoose, RN Outcome: Progressing Goal: Diagnostic test results will improve Outcome: Adequate for Discharge Goal: Respiratory complications will improve Outcome: Adequate for Discharge Goal: Cardiovascular complication will be avoided Outcome: Adequate for Discharge   Problem: Activity: Goal: Risk for activity intolerance will decrease Outcome: Adequate for Discharge   Problem: Nutrition: Goal: Adequate nutrition will be maintained Outcome: Adequate for Discharge   Problem: Coping: Goal: Level of anxiety will decrease Outcome: Adequate for Discharge   Problem: Elimination: Goal: Will not experience complications related to bowel motility Outcome: Adequate for Discharge Goal: Will not experience complications related to urinary retention Outcome: Adequate for Discharge   Problem: Pain Managment: Goal: General experience of comfort will improve Outcome: Adequate for Discharge   Problem: Safety: Goal: Ability to remain free from injury will improve Outcome: Adequate for Discharge   Problem: Skin Integrity: Goal: Risk for impaired skin integrity will decrease Outcome: Adequate for Discharge

## 2020-10-26 NOTE — Discharge Summary (Signed)
PATIENT DETAILS Name: [name] Age: 65 y.o. Sex: female Date of Birth: 27-Feb-1956 MRN: [id number]. Admitting Physician: Jonetta Osgood, MD [id number], Shanon Brow, MD  Admit Date: 10/24/2020 Discharge date: 10/26/2020  Recommendations for Outpatient Follow-up:  1. Follow up with PCP in 1-2 weeks 2. Please obtain CMP/CBC in one week  Admitted From:  Home  Disposition: [place]: No  Equipment/Devices: None  Discharge Condition: Stable  CODE STATUS: FULL CODE  Diet recommendation:  Diet Order            Diet - low sodium heart healthy           Diet Heart Room service appropriate? Yes; Fluid consistency: Thin  Diet effective now                  Brief Narrative: Patient is a 65 y.o. female with PMHx of HTN, depression, fibromyalgia-presenting with several days history of fever, cough, confusion and myalgias.  She tested positive for COVID-19 on 1/13.  COVID-19 vaccinated status: Vaccinated-but not boosted  Significant Events: 1/13>> Admit to [facility] for fever/myalgias/confusion  Significant studies: 1/13>>Chest x-ray: No pneumonia 1/14>> chest x-ray: No pneumonia  COVID-19 medications: Steroids: 1/13>> Remdesivir: 1/13>>1/15  Antibiotics: None  Microbiology data: None  Procedures: None  Consults: None  Brief Hospital Course: COVID-19 infection/Acute bronchitis:  Feels much better-on initial presentation-coughing-wheezing-chest x-ray without pneumonia-but could have had a small pneumonia not visible on imaging studies. Significantly better after initiation of steroid/Remdesivir and bronchodilators-suspect does not require a 5-day course of Remdesivir as no obvious infiltrates on chest x-ray x2. She remains on room air and symptomatically significantly better. Will plan on discharge on steroid taper, as needed bronchodilators and antitussives.    COVID-19 Labs:  Recent Labs    10/24/20 1239 10/25/20 0603  10/26/20 0251  DDIMER 0.97* 0.88* 0.76*  FERRITIN 241  --   --   LDH 154  --   --   CRP 24.0* 29.0* 12.3*    No results found for: 0000000   Acute metabolic encephalopathy: Due to dehydration and COVID-19 infection-significantly better--back to baseline.  CT head negative.  HTN: Controlled-continue Avapro  HLD: Continue Lipitor  Chronic pain syndrome: Chronic issue-resume home dosing of narcotics.  Anxiety/depression: Has been resumed on Xanax, Abilify and venlafaxine.   Per patient she also takes Ritalin-her outpatient doctors have been slowly weaning her down. PCP to continue to attempt to minimize polypharmacy as much as possible. Unable to adjust most of the medication during this short hospital stay.  ? Essential tremors: Per patient she is on propanolol given history of tremors. Follow-up with PCP.  Obesity: Estimated body mass index is 32.91 kg/m as calculated from the following:   Height as of this encounter: 5\' 1"  (1.549 m).   Weight as of this encounter: 79 kg.   Discharge Diagnoses:  Active Problems:   Hyperlipidemia   Essential hypertension   GERD   GAD (generalized anxiety disorder)   Chronic pain syndrome   COVID-19 virus infection   Discharge Instructions:    Person Under Monitoring Name: [name]  Location: [address] [place] [address]   Infection Prevention Recommendations for Individuals Confirmed to have, or Being Evaluated for, 2019 Novel Coronavirus (COVID-19) Infection Who Receive Care at Home  Individuals who are confirmed to have, or are being evaluated for, COVID-19 should follow the prevention steps below until a healthcare provider or local or state health department says they can return  to normal activities.  Stay home except to get medical care You should restrict activities outside your home, except for getting medical care. Do not go to work, school, or public areas, and do not use public  transportation or taxis.  Call ahead before visiting your doctor Before your medical appointment, call the healthcare provider and tell them that you have, or are being evaluated for, COVID-19 infection. This will help the healthcare provider's office take steps to keep other people from getting infected. Ask your healthcare provider to call the local or state health department.  Monitor your symptoms Seek prompt medical attention if your illness is worsening (e.g., difficulty breathing). Before going to your medical appointment, call the healthcare provider and tell them that you have, or are being evaluated for, COVID-19 infection. Ask your healthcare provider to call the local or state health department.  Wear a facemask You should wear a facemask that covers your nose and mouth when you are in the same room with other people and when you visit a healthcare provider. People who live with or visit you should also wear a facemask while they are in the same room with you.  Separate yourself from other people in your home As much as possible, you should stay in a different room from other people in your home. Also, you should use a separate bathroom, if available.  Avoid sharing household items You should not share dishes, drinking glasses, cups, eating utensils, towels, bedding, or other items with other people in your home. After using these items, you should wash them thoroughly with soap and water.  Cover your coughs and sneezes Cover your mouth and nose with a tissue when you cough or sneeze, or you can cough or sneeze into your sleeve. Throw used tissues in a lined trash can, and immediately wash your hands with soap and water for at least 20 seconds or use an alcohol-based hand rub.  Wash your [employer] Corporationhands Wash your hands often and thoroughly with soap and water for at least 20 seconds. You can use an alcohol-based hand sanitizer if soap and water are not available and if your hands  are not visibly dirty. Avoid touching your eyes, nose, and mouth with unwashed hands.   Prevention Steps for Caregivers and Household Members of Individuals Confirmed to have, or Being Evaluated for, COVID-19 Infection Being Cared for in the Home  If you live with, or provide care at home for, a person confirmed to have, or being evaluated for, COVID-19 infection please follow these guidelines to prevent infection:  Follow healthcare provider's instructions Make sure that you understand and can help the patient follow any healthcare provider instructions for all care.  Provide for the patient's basic needs You should help the patient with basic needs in the home and provide support for getting groceries, prescriptions, and other personal needs.  Monitor the patient's symptoms If they are getting sicker, call his or her medical provider and tell them that the patient has, or is being evaluated for, COVID-19 infection. This will help the healthcare provider's office take steps to keep other people from getting infected. Ask the healthcare provider to call the local or state health department.  Limit the number of people who have contact with the patient  If possible, have only one caregiver for the patient.  Other household members should stay in another home or place of residence. If this is not possible, they should stay  in another room, or be separated from the  patient as much as possible. Use a separate bathroom, if available.  Restrict visitors who do not have an essential need to be in the home.  Keep older adults, very young children, and other sick people away from the patient Keep older adults, very young children, and those who have compromised immune systems or chronic health conditions away from the patient. This includes people with chronic heart, lung, or kidney conditions, diabetes, and cancer.  Ensure good ventilation Make sure that shared spaces in the home have  good air flow, such as from an air conditioner or an opened window, weather permitting.  Wash your hands often  Wash your hands often and thoroughly with soap and water for at least 20 seconds. You can use an alcohol based hand sanitizer if soap and water are not available and if your hands are not visibly dirty.  Avoid touching your eyes, nose, and mouth with unwashed hands.  Use disposable paper towels to dry your hands. If not available, use dedicated cloth towels and replace them when they become wet.  Wear a facemask and gloves  Wear a disposable facemask at all times in the room and gloves when you touch or have contact with the patient's blood, body fluids, and/or secretions or excretions, such as sweat, saliva, sputum, nasal mucus, vomit, urine, or feces.  Ensure the mask fits over your nose and mouth tightly, and do not touch it during use.  Throw out disposable facemasks and gloves after using them. Do not reuse.  Wash your hands immediately after removing your facemask and gloves.  If your personal clothing becomes contaminated, carefully remove clothing and launder. Wash your hands after handling contaminated clothing.  Place all used disposable facemasks, gloves, and other waste in a lined container before disposing them with other household waste.  Remove gloves and wash your hands immediately after handling these items.  Do not share dishes, glasses, or other household items with the patient  Avoid sharing household items. You should not share dishes, drinking glasses, cups, eating utensils, towels, bedding, or other items with a patient who is confirmed to have, or being evaluated for, COVID-19 infection.  After the person uses these items, you should wash them thoroughly with soap and water.  Wash laundry thoroughly  Immediately remove and wash clothes or bedding that have blood, body fluids, and/or secretions or excretions, such as sweat, saliva, sputum, nasal  mucus, vomit, urine, or feces, on them.  Wear gloves when handling laundry from the patient.  Read and follow directions on labels of laundry or clothing items and detergent. In general, wash and dry with the warmest temperatures recommended on the label.  Clean all areas the individual has used often  Clean all touchable surfaces, such as counters, tabletops, doorknobs, bathroom fixtures, toilets, phones, keyboards, tablets, and bedside tables, every day. Also, clean any surfaces that may have blood, body fluids, and/or secretions or excretions on them.  Wear gloves when cleaning surfaces the patient has come in contact with.  Use a diluted bleach solution (e.g., dilute bleach with 1 part bleach and 10 parts water) or a household disinfectant with a label that says EPA-registered for coronaviruses. To make a bleach solution at home, add 1 tablespoon of bleach to 1 quart (4 cups) of water. For a larger supply, add  cup of bleach to 1 gallon (16 cups) of water.  Read labels of cleaning products and follow recommendations provided on product labels. Labels contain instructions for safe and effective use  of the cleaning product including precautions you should take when applying the product, such as wearing gloves or eye protection and making sure you have good ventilation during use of the product.  Remove gloves and wash hands immediately after cleaning.  Monitor yourself for signs and symptoms of illness Caregivers and household members are considered close contacts, should monitor their health, and will be asked to limit movement outside of the home to the extent possible. Follow the monitoring steps for close contacts listed on the symptom monitoring form.   ? If you have additional questions, contact your local health department or call the epidemiologist on call at [phone] (available 24/7). ? This guidance is subject to change. For the most up-to-date guidance from CDC, please  refer to their website: YouBlogs.pl    Activity:  As tolerated  Discharge Instructions    Call MD for:  difficulty breathing, headache or visual disturbances   Complete by: As directed    Diet - low sodium heart healthy   Complete by: As directed    Discharge instructions   Complete by: As directed    1.) 21 days of isolation from the day of your first positive test (10/24/2020) or first day of your symptoms  2.) If you develop worsening shortness of breath-please seek immediate medical attention.   Follow with Primary MD  Bernerd Limbo, MD in 1-2 weeks  Please get a complete blood count and chemistry panel checked by your Primary MD at your next visit, and again as instructed by your Primary MD.  Get Medicines reviewed and adjusted: Please take all your medications with you for your next visit with your Primary MD  Laboratory/radiological data: Please request your Primary MD to go over all hospital tests and procedure/radiological results at the follow up, please ask your Primary MD to get all Hospital records sent to his/her office.  In some cases, they will be blood work, cultures and biopsy results pending at the time of your discharge. Please request that your primary care M.D. follows up on these results.  Also Note the following: If you experience worsening of your admission symptoms, develop shortness of breath, life threatening emergency, suicidal or homicidal thoughts you must seek medical attention immediately by calling 911 or calling your MD immediately  if symptoms less severe.  You must read complete instructions/literature along with all the possible adverse reactions/side effects for all the Medicines you take and that have been prescribed to you. Take any new Medicines after you have completely understood and accpet all the possible adverse reactions/side effects.   Do not drive when taking Pain  medications or sleeping medications (Benzodaizepines)  Do not take more than prescribed Pain, Sleep and Anxiety Medications. It is not advisable to combine anxiety,sleep and pain medications without talking with your primary care practitioner  Special Instructions: If you have smoked or chewed Tobacco  in the last 2 yrs please stop smoking, stop any regular Alcohol  and or any Recreational drug use.  Wear Seat belts while driving.  Please note: You were cared for by a hospitalist during your hospital stay. Once you are discharged, your primary care physician will handle any further medical issues. Please note that NO REFILLS for any discharge medications will be authorized once you are discharged, as it is imperative that you return to your primary care physician (or establish a relationship with a primary care physician if you do not have one) for your post hospital discharge needs so that they can  reassess your need for medications and monitor your lab values.   Increase activity slowly   Complete by: As directed      Allergies as of 10/26/2020      Reactions   Gabapentin Other (See Comments)   Bad headaches   Poison Ivy Extract [poison Ivy Extract] Rash   Poison Oak Extract [poison Oak Extract] Rash   Poison Sumac Extract Rash      Medication List    TAKE these medications   acyclovir 800 MG tablet Commonly known as: ZOVIRAX TAKE 1 TABLET (800 MG TOTAL) BY MOUTH DAILY. What changed:   when to take this  reasons to take this   albuterol 108 (90 Base) MCG/ACT inhaler Commonly known as: VENTOLIN HFA Inhale 2 puffs into the lungs every 6 (six) hours as needed for wheezing or shortness of breath.   ALPRAZolam 1 MG tablet Commonly known as: XANAX TAKE 1/2 TAB BY MOUTH 3 TIMES A DAY AS NEEDED FOR ANXIETY What changed:   how much to take  how to take this  when to take this  reasons to take this  additional instructions   ARIPiprazole 2 MG tablet Commonly known as:  ABILIFY Take 1 tablet (2 mg total) by mouth daily.   atorvastatin 40 MG tablet Commonly known as: LIPITOR Take 40 mg by mouth daily.   benzonatate 100 MG capsule Commonly known as: Tessalon Perles Take 1 capsule (100 mg total) by mouth every 6 (six) hours as needed for cough.   cyclobenzaprine 5 MG tablet Commonly known as: FLEXERIL TAKE 1 TABLET (5 MG TOTAL) BY MOUTH AT BEDTIME AS NEEDED FOR MUSCLE SPASMS. THREE MONTH SUPPLY   HYDROcodone-acetaminophen 10-325 MG tablet Commonly known as: NORCO Take 1 tablet by mouth every 6 (six) hours as needed. What changed: when to take this   irbesartan 75 MG tablet Commonly known as: Avapro Take 1 tablet (75 mg total) by mouth daily.   methylphenidate 10 MG tablet Commonly known as: RITALIN Take 1 tablet (10 mg total) by mouth 2 (two) times daily with breakfast and lunch. [id number]   omeprazole 40 MG capsule Commonly known as: PRILOSEC Take 40 mg by mouth 2 (two) times daily.   predniSONE 10 MG tablet Commonly known as: DELTASONE Take 40 mg daily for 1 day, 30 mg daily for 1 day, 20 mg daily for 1 days,10 mg daily for 1 day, then stop   propranolol 40 MG tablet Commonly known as: INDERAL TAKE 1 TABLET BY MOUTH TWICE A DAY   Restasis 0.05 % ophthalmic emulsion Generic drug: cycloSPORINE Place 1 drop into both eyes 2 (two) times daily.   venlafaxine XR 150 MG 24 hr capsule Commonly known as: EFFEXOR-XR One capsule daily with breakfast. What changed:   how much to take  how to take this  when to take this  additional instructions       Follow-up Information    Bernerd Limbo, MD. Schedule an appointment as soon as possible for a visit in 1 week(s).   Specialty: Family Medicine Contact information: [address] [place] [address] [phone]              Allergies  Allergen Reactions  . Gabapentin Other (See Comments)    Bad headaches  . Poison Ivy Extract [Poison Ivy Extract] Rash   . Poison Oak Extract [Poison Oak Extract] Rash  . Poison Sumac Extract Rash     Other Procedures/Studies: CT Head Wo Contrast  Result  Date: 10/24/2020 CLINICAL DATA:  Delirium, altered mental status. EXAM: CT HEAD WITHOUT CONTRAST TECHNIQUE: Contiguous axial images were obtained from the base of the skull through the vertex without intravenous contrast. COMPARISON:  Brain MRI 09/25/2015 FINDINGS: Brain: Mild generalized atrophy. No intracranial hemorrhage, mass effect, or midline shift. No hydrocephalus. The basilar cisterns are patent. Periventricular and deep white matter hypodensity typical of chronic small vessel ischemia, similar to prior exam allowing for differences in modality. No evidence of territorial infarct or acute ischemia. No extra-axial or intracranial fluid collection. Vascular: No hyperdense vessel. Skull: No fracture or focal lesion. Sinuses/Orbits: Mild diffuse mucosal thickening with small fluid levels in the maxillary sinuses and right sphenoid sinus. Mastoid air cells are clear. No acute orbital abnormality. Bilateral cataract resection. Other: None. IMPRESSION: 1. No acute intracranial abnormality. 2. Generalized atrophy and chronic small vessel ischemia. 3. Paranasal sinus disease with small fluid levels in the maxillary sinuses and right sphenoid sinus, can be seen with acute sinusitis in the appropriate clinical setting. Electronically Signed   By: Keith Rake M.D.   On: 10/24/2020 15:57   DG Chest Port 1 View  Result Date: 10/25/2020 CLINICAL DATA:  Short of breath EXAM: PORTABLE CHEST 1 VIEW COMPARISON:  10/24/2020 FINDINGS: The heart size and mediastinal contours are within normal limits. Both lungs are clear. The visualized skeletal structures are unremarkable. IMPRESSION: No active disease. Electronically Signed   By: Franchot Gallo M.D.   On: 10/25/2020 08:00   DG Chest Port 1 View  Result Date: 10/24/2020 CLINICAL DATA:  COVID positive. EXAM: PORTABLE CHEST 1  VIEW COMPARISON:  06/12/2015 FINDINGS: 1223 hours. Low volumes with asymmetric elevation right hemidiaphragm. The cardio pericardial silhouette is enlarged. Interstitial markings are diffusely coarsened with chronic features. No focal consolidation, edema, or pleural effusion. Telemetry leads overlie the chest. IMPRESSION: Low volume film without acute cardiopulmonary findings. Electronically Signed   By: Misty Stanley M.D.   On: 10/24/2020 12:35     TODAY-DAY OF DISCHARGE:  Subjective:   Taneia Mealor today has no headache,no chest abdominal pain,no new weakness tingling or numbness, feels much better wants to go home today.   Objective:   Blood pressure (!) 158/94, pulse 73, temperature 98.6 F (37 C), temperature source Oral, resp. rate 17, height 5\' 1"  (1.549 m), weight 79 kg, SpO2 100 %.  Intake/Output Summary (Last 24 hours) at 10/26/2020 0855 Last data filed at 10/26/2020 0400 Gross per 24 hour  Intake 1200 ml  Output -  Net 1200 ml   Filed Weights   10/24/20 1225  Weight: 79 kg    Exam: Awake Alert, Oriented *3, No new F.N deficits, Normal affect [place].AT,PERRAL Supple Neck,No JVD, No cervical lymphadenopathy appriciated.  Symmetrical Chest wall movement, Good air movement bilaterally, CTAB RRR,No Gallops,Rubs or new Murmurs, No Parasternal Heave +ve B.Sounds, Abd Soft, Non tender, No organomegaly appriciated, No rebound -guarding or rigidity. No Cyanosis, Clubbing or edema, No new Rash or bruise   PERTINENT RADIOLOGIC STUDIES: CT Head Wo Contrast  Result Date: 10/24/2020 CLINICAL DATA:  Delirium, altered mental status. EXAM: CT HEAD WITHOUT CONTRAST TECHNIQUE: Contiguous axial images were obtained from the base of the skull through the vertex without intravenous contrast. COMPARISON:  Brain MRI 09/25/2015 FINDINGS: Brain: Mild generalized atrophy. No intracranial hemorrhage, mass effect, or midline shift. No hydrocephalus. The basilar cisterns are patent. Periventricular  and deep white matter hypodensity typical of chronic small vessel ischemia, similar to prior exam allowing for differences in modality. No evidence of  territorial infarct or acute ischemia. No extra-axial or intracranial fluid collection. Vascular: No hyperdense vessel. Skull: No fracture or focal lesion. Sinuses/Orbits: Mild diffuse mucosal thickening with small fluid levels in the maxillary sinuses and right sphenoid sinus. Mastoid air cells are clear. No acute orbital abnormality. Bilateral cataract resection. Other: None. IMPRESSION: 1. No acute intracranial abnormality. 2. Generalized atrophy and chronic small vessel ischemia. 3. Paranasal sinus disease with small fluid levels in the maxillary sinuses and right sphenoid sinus, can be seen with acute sinusitis in the appropriate clinical setting. Electronically Signed   By: Keith Rake M.D.   On: 10/24/2020 15:57   DG Chest Port 1 View  Result Date: 10/25/2020 CLINICAL DATA:  Short of breath EXAM: PORTABLE CHEST 1 VIEW COMPARISON:  10/24/2020 FINDINGS: The heart size and mediastinal contours are within normal limits. Both lungs are clear. The visualized skeletal structures are unremarkable. IMPRESSION: No active disease. Electronically Signed   By: Franchot Gallo M.D.   On: 10/25/2020 08:00   DG Chest Port 1 View  Result Date: 10/24/2020 CLINICAL DATA:  COVID positive. EXAM: PORTABLE CHEST 1 VIEW COMPARISON:  06/12/2015 FINDINGS: 1223 hours. Low volumes with asymmetric elevation right hemidiaphragm. The cardio pericardial silhouette is enlarged. Interstitial markings are diffusely coarsened with chronic features. No focal consolidation, edema, or pleural effusion. Telemetry leads overlie the chest. IMPRESSION: Low volume film without acute cardiopulmonary findings. Electronically Signed   By: Misty Stanley M.D.   On: 10/24/2020 12:35     PERTINENT LAB RESULTS: CBC: Recent Labs    10/25/20 0603 10/26/20 0251  WBC 4.9 6.3  HGB 11.7* 12.2   HCT 34.9* 37.8  PLT 178 227   CMET CMP     Component Value Date/Time   NA 139 10/26/2020 0251   K 3.2 (L) 10/26/2020 0251   CL 106 10/26/2020 0251   CO2 21 (L) 10/26/2020 0251   GLUCOSE 150 (H) 10/26/2020 0251   BUN 12 10/26/2020 0251   CREATININE 0.80 10/26/2020 0251   CREATININE 0.86 03/22/2014 1438   CALCIUM 9.1 10/26/2020 0251   PROT 6.2 (L) 10/26/2020 0251   ALBUMIN 3.4 (L) 10/26/2020 0251   AST 29 10/26/2020 0251   ALT 31 10/26/2020 0251   ALKPHOS 59 10/26/2020 0251   BILITOT 0.4 10/26/2020 0251   GFRNONAA >60 10/26/2020 0251   GFRNONAA 76 03/22/2014 1438   GFRAA >60 06/12/2015 1742   GFRAA 87 03/22/2014 1438    GFR Estimated Creatinine Clearance: 68.5 mL/min (by C-G formula based on SCr of 0.8 mg/dL). No results for input(s): LIPASE, AMYLASE in the last 72 hours. No results for input(s): CKTOTAL, CKMB, CKMBINDEX, TROPONINI in the last 72 hours. Invalid input(s): POCBNP Recent Labs    10/25/20 0603 10/26/20 0251  DDIMER 0.88* 0.76*   No results for input(s): HGBA1C in the last 72 hours. Recent Labs    10/24/20 1239  TRIG 93   No results for input(s): TSH, T4TOTAL, T3FREE, THYROIDAB in the last 72 hours.  Invalid input(s): FREET3 Recent Labs    10/24/20 1239  FERRITIN 241   Coags: Recent Labs    10/24/20 1239  INR 1.2   Microbiology: No results found for this or any previous visit (from the past 240 hour(s)).  FURTHER DISCHARGE INSTRUCTIONS:  Get Medicines reviewed and adjusted: Please take all your medications with you for your next visit with your Primary MD  Laboratory/radiological data: Please request your Primary MD to go over all hospital tests and procedure/radiological results at the  follow up, please ask your Primary MD to get all Hospital records sent to his/her office.  In some cases, they will be blood work, cultures and biopsy results pending at the time of your discharge. Please request that your primary care M.D. goes through  all the records of your hospital data and follows up on these results.  Also Note the following: If you experience worsening of your admission symptoms, develop shortness of breath, life threatening emergency, suicidal or homicidal thoughts you must seek medical attention immediately by calling 911 or calling your MD immediately  if symptoms less severe.  You must read complete instructions/literature along with all the possible adverse reactions/side effects for all the Medicines you take and that have been prescribed to you. Take any new Medicines after you have completely understood and accpet all the possible adverse reactions/side effects.   Do not drive when taking Pain medications or sleeping medications (Benzodaizepines)  Do not take more than prescribed Pain, Sleep and Anxiety Medications. It is not advisable to combine anxiety,sleep and pain medications without talking with your primary care practitioner  Special Instructions: If you have smoked or chewed Tobacco  in the last 2 yrs please stop smoking, stop any regular Alcohol  and or any Recreational drug use.  Wear Seat belts while driving.  Please note: You were cared for by a hospitalist during your hospital stay. Once you are discharged, your primary care physician will handle any further medical issues. Please note that NO REFILLS for any discharge medications will be authorized once you are discharged, as it is imperative that you return to your primary care physician (or establish a relationship with a primary care physician if you do not have one) for your post hospital discharge needs so that they can reassess your need for medications and monitor your lab values.  Total Time spent coordinating discharge including counseling, education and face to face time equals 35 minutes.  SignedOren Binet 10/26/2020 8:55 AM

## 2020-10-26 NOTE — Discharge Instructions (Signed)
Person Under Monitoring Name: [name]  Location: [address] [place] [address]   Infection Prevention Recommendations for Individuals Confirmed to have, or Being Evaluated for, 2019 Novel Coronavirus (COVID-19) Infection Who Receive Care at Home  Individuals who are confirmed to have, or are being evaluated for, COVID-19 should follow the prevention steps below until a healthcare provider or local or state health department says they can return to normal activities.  Stay home except to get medical care You should restrict activities outside your home, except for getting medical care. Do not go to work, school, or public areas, and do not use public transportation or taxis.  Call ahead before visiting your doctor Before your medical appointment, call the healthcare provider and tell them that you have, or are being evaluated for, COVID-19 infection. This will help the healthcare provider's office take steps to keep other people from getting infected. Ask your healthcare provider to call the local or state health department.  Monitor your symptoms Seek prompt medical attention if your illness is worsening (e.g., difficulty breathing). Before going to your medical appointment, call the healthcare provider and tell them that you have, or are being evaluated for, COVID-19 infection. Ask your healthcare provider to call the local or state health department.  Wear a facemask You should wear a facemask that covers your nose and mouth when you are in the same room with other people and when you visit a healthcare provider. People who live with or visit you should also wear a facemask while they are in the same room with you.  Separate yourself from other people in your home As much as possible, you should stay in a different room from other people in your home. Also, you should use a separate bathroom, if available.  Avoid sharing household items You should  not share dishes, drinking glasses, cups, eating utensils, towels, bedding, or other items with other people in your home. After using these items, you should wash them thoroughly with soap and water.  Cover your coughs and sneezes Cover your mouth and nose with a tissue when you cough or sneeze, or you can cough or sneeze into your sleeve. Throw used tissues in a lined trash can, and immediately wash your hands with soap and water for at least 20 seconds or use an alcohol-based hand rub.  Wash your [facility] your hands often and thoroughly with soap and water for at least 20 seconds. You can use an alcohol-based hand sanitizer if soap and water are not available and if your hands are not visibly dirty. Avoid touching your eyes, nose, and mouth with unwashed hands.   Prevention Steps for Caregivers and Household Members of Individuals Confirmed to have, or Being Evaluated for, COVID-19 Infection Being Cared for in the Home  If you live with, or provide care at home for, a person confirmed to have, or being evaluated for, COVID-19 infection please follow these guidelines to prevent infection:  Follow healthcare provider's instructions Make sure that you understand and can help the patient follow any healthcare provider instructions for all care.  Provide for the patient's basic needs You should help the patient with basic needs in the home and provide support for getting groceries, prescriptions, and other personal needs.  Monitor the patient's symptoms If they are getting sicker, call his or her medical provider and tell them that the patient has, or is being evaluated for, COVID-19 infection. This will help the healthcare  provider's office take steps to keep other people from getting infected. Ask the healthcare provider to call the local or state health department.  Limit the number of people who have contact with the patient  If possible, have only one caregiver for the  patient.  Other household members should stay in another home or place of residence. If this is not possible, they should stay  in another room, or be separated from the patient as much as possible. Use a separate bathroom, if available.  Restrict visitors who do not have an essential need to be in the home.  Keep older adults, very young children, and other sick people away from the patient Keep older adults, very young children, and those who have compromised immune systems or chronic health conditions away from the patient. This includes people with chronic heart, lung, or kidney conditions, diabetes, and cancer.  Ensure good ventilation Make sure that shared spaces in the home have good air flow, such as from an air conditioner or an opened window, weather permitting.  Wash your hands often  Wash your hands often and thoroughly with soap and water for at least 20 seconds. You can use an alcohol based hand sanitizer if soap and water are not available and if your hands are not visibly dirty.  Avoid touching your eyes, nose, and mouth with unwashed hands.  Use disposable paper towels to dry your hands. If not available, use dedicated cloth towels and replace them when they become wet.  Wear a facemask and gloves  Wear a disposable facemask at all times in the room and gloves when you touch or have contact with the patient's blood, body fluids, and/or secretions or excretions, such as sweat, saliva, sputum, nasal mucus, vomit, urine, or feces.  Ensure the mask fits over your nose and mouth tightly, and do not touch it during use.  Throw out disposable facemasks and gloves after using them. Do not reuse.  Wash your hands immediately after removing your facemask and gloves.  If your personal clothing becomes contaminated, carefully remove clothing and launder. Wash your hands after handling contaminated clothing.  Place all used disposable facemasks, gloves, and other waste in a lined  container before disposing them with other household waste.  Remove gloves and wash your hands immediately after handling these items.  Do not share dishes, glasses, or other household items with the patient  Avoid sharing household items. You should not share dishes, drinking glasses, cups, eating utensils, towels, bedding, or other items with a patient who is confirmed to have, or being evaluated for, COVID-19 infection.  After the person uses these items, you should wash them thoroughly with soap and water.  Wash laundry thoroughly  Immediately remove and wash clothes or bedding that have blood, body fluids, and/or secretions or excretions, such as sweat, saliva, sputum, nasal mucus, vomit, urine, or feces, on them.  Wear gloves when handling laundry from the patient.  Read and follow directions on labels of laundry or clothing items and detergent. In general, wash and dry with the warmest temperatures recommended on the label.  Clean all areas the individual has used often  Clean all touchable surfaces, such as counters, tabletops, doorknobs, bathroom fixtures, toilets, phones, keyboards, tablets, and bedside tables, every day. Also, clean any surfaces that may have blood, body fluids, and/or secretions or excretions on them.  Wear gloves when cleaning surfaces the patient has come in contact with.  Use a diluted bleach solution (e.g., dilute bleach with  1 part bleach and 10 parts water) or a household disinfectant with a label that says EPA-registered for coronaviruses. To make a bleach solution at home, add 1 tablespoon of bleach to 1 quart (4 cups) of water. For a larger supply, add  cup of bleach to 1 gallon (16 cups) of water.  Read labels of cleaning products and follow recommendations provided on product labels. Labels contain instructions for safe and effective use of the cleaning product including precautions you should take when applying the product, such as wearing gloves or  eye protection and making sure you have good ventilation during use of the product.  Remove gloves and wash hands immediately after cleaning.  Monitor yourself for signs and symptoms of illness Caregivers and household members are considered close contacts, should monitor their health, and will be asked to limit movement outside of the home to the extent possible. Follow the monitoring steps for close contacts listed on the symptom monitoring form.   ? If you have additional questions, contact your local health department or call the epidemiologist on call at [phone] (available 24/7). ? This guidance is subject to change. For the most up-to-date guidance from [facility], please refer to their website: [email or web address]

## 2020-10-27 LAB — URINE CULTURE: Culture: >=100000 — AB

## 2020-11-04 DIAGNOSIS — Z8616 Personal history of COVID-19: Secondary | ICD-10-CM | POA: Diagnosis not present

## 2020-11-04 DIAGNOSIS — Z09 Encounter for follow-up examination after completed treatment for conditions other than malignant neoplasm: Secondary | ICD-10-CM | POA: Diagnosis not present

## 2020-11-06 ENCOUNTER — Ambulatory Visit: Payer: BC Managed Care – PPO | Admitting: Family Medicine

## 2020-11-12 ENCOUNTER — Encounter: Payer: BC Managed Care – PPO | Attending: Physical Medicine and Rehabilitation | Admitting: Registered Nurse

## 2020-11-12 ENCOUNTER — Other Ambulatory Visit: Payer: Self-pay

## 2020-11-12 ENCOUNTER — Encounter: Payer: Self-pay | Admitting: Registered Nurse

## 2020-11-12 VITALS — BP 119/77 | HR 62 | Temp 98.6°F | Ht 61.0 in | Wt 166.4 lb

## 2020-11-12 DIAGNOSIS — Z79891 Long term (current) use of opiate analgesic: Secondary | ICD-10-CM | POA: Insufficient documentation

## 2020-11-12 DIAGNOSIS — M62838 Other muscle spasm: Secondary | ICD-10-CM

## 2020-11-12 DIAGNOSIS — F0781 Postconcussional syndrome: Secondary | ICD-10-CM | POA: Diagnosis not present

## 2020-11-12 DIAGNOSIS — M47817 Spondylosis without myelopathy or radiculopathy, lumbosacral region: Secondary | ICD-10-CM | POA: Diagnosis not present

## 2020-11-12 DIAGNOSIS — Z5181 Encounter for therapeutic drug level monitoring: Secondary | ICD-10-CM | POA: Insufficient documentation

## 2020-11-12 DIAGNOSIS — M5416 Radiculopathy, lumbar region: Secondary | ICD-10-CM

## 2020-11-12 DIAGNOSIS — M961 Postlaminectomy syndrome, not elsewhere classified: Secondary | ICD-10-CM

## 2020-11-12 DIAGNOSIS — M797 Fibromyalgia: Secondary | ICD-10-CM | POA: Diagnosis not present

## 2020-11-12 DIAGNOSIS — M47816 Spondylosis without myelopathy or radiculopathy, lumbar region: Secondary | ICD-10-CM | POA: Diagnosis not present

## 2020-11-12 DIAGNOSIS — G894 Chronic pain syndrome: Secondary | ICD-10-CM | POA: Diagnosis not present

## 2020-11-12 MED ORDER — HYDROCODONE-ACETAMINOPHEN 10-325 MG PO TABS: 1.0000 | ORAL_TABLET | Freq: Four times a day (QID) | ORAL | 0 refills | Status: DC | PRN

## 2020-11-12 MED ORDER — METHYLPHENIDATE HCL 10 MG PO TABS: 10.0000 mg | ORAL_TABLET | Freq: Two times a day (BID) | ORAL | 0 refills | Status: DC

## 2020-11-12 NOTE — Progress Notes (Signed)
Subjective:    Patient ID: [name], female    DOB: March 26, 1956, 65 y.o.   MRN: [id number]  HPI: [name] is a 65 y.o. female who returns for follow up appointment for chronic pain and medication refill. She states her  pain is located in her lower back pain radiating into her left lower extremity. She rates her pain 9. Her current exercise regime is walking, she's not following her usual exercise regime since discharge due to weakness.   [name] was admitted to [facility] on 10/24/2020, for COVID-19, Acute Bronchitis and discharged on 10/26/2020.   [name] Morphine equivalent is  40.00 MME. She is also prescribed Alprazolam. We have discussed the black box warning of using opioids and benzodiazepines. I highlighted the dangers of using these drugs together and discussed the adverse events including respiratory suppression, overdose, cognitive impairment and importance of compliance with current regimen. We will continue to monitor and adjust as indicated.  she is being closely monitored and under the care of her psychologist Dr Sima Matas. .     Last oral swab was performed on 10/15/2020,it was consistent.    Pain Inventory Average Pain 9 Pain Right Now 9 My pain is constant, sharp, burning, dull, stabbing, tingling and aching  In the last 24 hours, has pain interfered with the following? General activity 10 Relation with others 10 Enjoyment of life 10 What TIME of day is your pain at its worst? morning , daytime, evening, night and varies Sleep (in general) Fair  Pain is worse with: walking, bending, sitting, inactivity, standing, some activites and PRAYING Pain improves with: rest, heat/ice and medication Relief from Meds: 8  Family History  Problem Relation Age of Onset  . Cervical cancer Mother   . Diabetes Mother   . Stroke Mother   . Alzheimer's disease Mother   . Heart attack Father   . Stroke Father   . Colon cancer Father   .  Anxiety disorder Maternal Aunt   . Depression Maternal Aunt   . Anxiety disorder Maternal Uncle   . Depression Maternal Uncle        suicide attempt by gun shot wound to head. Survived  . Alcohol abuse Other   . Drug abuse Other   . Breast cancer Maternal Grandmother   . Diabetes Maternal Grandmother   . Breast cancer Paternal Aunt   . Colon polyps Sister        x 2   Social History   Socioeconomic History  . Marital status: Married    Spouse name: Not on file  . Number of children: 0  . Years of education: Not on file  . Highest education level: Not on file  Occupational History  . Occupation: disabled  Tobacco Use  . Smoking status: Never Smoker  . Smokeless tobacco: Never Used  Vaping Use  . Vaping Use: Never used  Substance and Sexual Activity  . Alcohol use: No    Alcohol/week: 0.0 standard drinks  . Drug use: No  . Sexual activity: Yes  Other Topics Concern  . Not on file  Social History Narrative   Pt has h.s. degree   Social Determinants of Health   Financial Resource Strain: Not on file  Food Insecurity: Not on file  Transportation Needs: Not on file  Physical Activity: Not on file  Stress: Not on file  Social Connections: Not on file   Past Surgical History:  Procedure Laterality Date  . ABDOMINAL  HYSTERECTOMY  1999  . bunions removed  1988  . C4 C5 cage insertion  06/28/2013  . CHOLECYSTECTOMY  04/25/2008  . EDG  12/17/2004  . ELECTROCARDIOGRAM  05/27/2007  . L5 S1 rod insertion  11/07/2012  . SKIN CANCER EXCISION    . SKIN CANCER EXCISION     several  . TONSILLECTOMY  1981  . TUBAL LIGATION  1997   Past Surgical History:  Procedure Laterality Date  . ABDOMINAL HYSTERECTOMY  1999  . bunions removed  1988  . C4 C5 cage insertion  06/28/2013  . CHOLECYSTECTOMY  04/25/2008  . EDG  12/17/2004  . ELECTROCARDIOGRAM  05/27/2007  . L5 S1 rod insertion  11/07/2012  . SKIN CANCER EXCISION    . SKIN CANCER EXCISION     several  . TONSILLECTOMY  1981  .  TUBAL LIGATION  1997   Past Medical History:  Diagnosis Date  . Anemia   . ANXIETY 06/10/2007  . Cataract   . Chronic headaches   . Degenerative arthritis of spine    back and neck  . Depression   . DYSLIPIDEMIA 06/28/2008  . Esophageal stricture   . Fibromyalgia   . Gallstones   . GERD 06/10/2007  . Hiatal hernia   . 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 HYPERTENSION 06/10/2007  . Irritable bowel syndrome 06/28/2008  . OSTEOARTHRITIS 06/10/2007  . Skin cancer    basal cell  . Tubular adenoma of colon    There were no vitals taken for this visit.  Opioid Risk Score:   Fall Risk Score:  `1  Depression screen PHQ 2/9  Depression screen [facility] 2/9 10/15/2020 09/11/2020 05/14/2020 01/18/2020 10/24/2019 10/18/2019 03/13/2019  Decreased Interest 1 1 0 1 1 1 1   Down, Depressed, Hopeless 1 1 0 1 1 1 1   PHQ - 2 Score 2 2 0 2 2 2 2   Altered sleeping - - - - 0 - -  Tired, decreased energy - - - - 3 - -  Change in appetite - - - - 3 - -  Feeling bad or failure about yourself  - - - - 0 - -  Trouble concentrating - - - - 0 - -  Moving slowly or fidgety/restless - - - - 0 - -  Suicidal thoughts - - - - 0 - -  PHQ-9 Score - - - - 8 - -  Difficult doing work/chores - - - - - - -  Some recent data might be hidden   Review of Systems  Respiratory: Positive for chest tightness and shortness of breath.   Musculoskeletal: Positive for gait problem.       Right Shoulder pain  Left leg pain  Neurological: Positive for headaches.  All other systems reviewed and are negative.      Objective:   Physical Exam Vitals and nursing note reviewed.  Constitutional:      Appearance: Normal appearance.  Cardiovascular:     Rate and Rhythm: Normal rate and regular rhythm.     Pulses: Normal pulses.     Heart sounds: Normal heart sounds.  Pulmonary:     Effort: Pulmonary effort is normal.     Breath sounds: Normal breath sounds.  Musculoskeletal:     Cervical back: Normal range of motion and neck supple.     Comments:  Normal Muscle Bulk and Muscle Testing Reveals:  Upper Extremities: Full ROM and Muscle Strength 5/5 Lumbar Paraspinal Tenderness: L-4- L-5 Lower Extremities: Full ROM and Muscle Strength  5/5 Arises from Table Slowly using cane for support Narrow Based  Gait   Skin:    General: Skin is warm and dry.  Neurological:     Mental Status: She is alert and oriented to person, place, and time.  Psychiatric:        Mood and Affect: Mood normal.        Behavior: Behavior normal.           Assessment & Plan:  1. History of chronic lumbar facet disease with spondylosis/DDD/ L4-5 spondylolisthesis.With L4-5 L5-S1 decompression stabilization:11/12/2020 Refilled:Hydrocodone 10/325mg  one tablet every 6 hours as needed #120. We will continue the opioid monitoring program, this consists of regular clinic visits, examinations, urine drug screen, pill counts as well as use of [place] Controlled Substance Reporting system. A 12 month History has been reviewed on the [place] Controlled Substance Reporting Systemon 11/12/2020. 2. Depression with anxiety/ PTSD: Dr. Sima Matas Following.Continue Xanax,Abilifyand Effexor.11/12/2020 3. Cervical spondylosis: Stable at this time with no complaints. Continue Medication Regime.11/12/2020 4. 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 Continue with  slow weaning.11/12/2020. 5. Muscle Spasm: Continuecurrent medication regimen withFlexeril: May take 1-2 tablets at HS.11/12/2020 6. Left Lumbar Radiculitis:Continue HEP as Tolerated .Continue to Monitor.11/12/2020. 7. Left Knee Osteoarthritis:No complaints today.S/PLeft Knee Injectionon 01/15/2020with good relief noted.11/12/2020 8. Right Knee Pain:No complaints today. Continue toalternate with ice and heat therapy. Continue to monitor.11/12/2020  F/U in 1 month

## 2020-12-02 ENCOUNTER — Encounter: Payer: BC Managed Care – PPO | Admitting: Psychology

## 2020-12-08 ENCOUNTER — Other Ambulatory Visit: Payer: Self-pay | Admitting: Registered Nurse

## 2020-12-09 ENCOUNTER — Other Ambulatory Visit: Payer: Self-pay | Admitting: Family Medicine

## 2020-12-09 DIAGNOSIS — Z1231 Encounter for screening mammogram for malignant neoplasm of breast: Secondary | ICD-10-CM

## 2020-12-10 ENCOUNTER — Encounter: Payer: BC Managed Care – PPO | Attending: Physical Medicine and Rehabilitation | Admitting: Registered Nurse

## 2020-12-10 ENCOUNTER — Encounter: Payer: Self-pay | Admitting: Registered Nurse

## 2020-12-10 ENCOUNTER — Other Ambulatory Visit: Payer: Self-pay

## 2020-12-10 VITALS — BP 144/86 | HR 65 | Temp 99.0°F | Ht 61.0 in | Wt 168.0 lb

## 2020-12-10 DIAGNOSIS — M47817 Spondylosis without myelopathy or radiculopathy, lumbosacral region: Secondary | ICD-10-CM

## 2020-12-10 DIAGNOSIS — M546 Pain in thoracic spine: Secondary | ICD-10-CM | POA: Diagnosis not present

## 2020-12-10 DIAGNOSIS — M961 Postlaminectomy syndrome, not elsewhere classified: Secondary | ICD-10-CM

## 2020-12-10 DIAGNOSIS — M5416 Radiculopathy, lumbar region: Secondary | ICD-10-CM

## 2020-12-10 DIAGNOSIS — M797 Fibromyalgia: Secondary | ICD-10-CM | POA: Diagnosis not present

## 2020-12-10 DIAGNOSIS — M62838 Other muscle spasm: Secondary | ICD-10-CM | POA: Diagnosis not present

## 2020-12-10 DIAGNOSIS — G894 Chronic pain syndrome: Secondary | ICD-10-CM

## 2020-12-10 DIAGNOSIS — G8929 Other chronic pain: Secondary | ICD-10-CM | POA: Diagnosis not present

## 2020-12-10 DIAGNOSIS — Z5181 Encounter for therapeutic drug level monitoring: Secondary | ICD-10-CM

## 2020-12-10 DIAGNOSIS — Z79891 Long term (current) use of opiate analgesic: Secondary | ICD-10-CM

## 2020-12-10 DIAGNOSIS — F0781 Postconcussional syndrome: Secondary | ICD-10-CM

## 2020-12-10 DIAGNOSIS — M47816 Spondylosis without myelopathy or radiculopathy, lumbar region: Secondary | ICD-10-CM | POA: Diagnosis not present

## 2020-12-10 MED ORDER — METHYLPHENIDATE HCL 10 MG PO TABS
10.0000 mg | ORAL_TABLET | Freq: Two times a day (BID) | ORAL | 0 refills | Status: DC
Start: 2020-12-10 — End: 2021-01-09

## 2020-12-10 MED ORDER — HYDROCODONE-ACETAMINOPHEN 10-325 MG PO TABS
1.0000 | ORAL_TABLET | Freq: Four times a day (QID) | ORAL | 0 refills | Status: DC | PRN
Start: 2020-12-10 — End: 2021-01-09

## 2020-12-10 NOTE — Progress Notes (Signed)
Subjective:    Patient ID: [name], female    DOB: Jun 23, 1956, 65 y.o.   MRN: [id number]  HPI: [name] is a 65 y.o. female who returns for follow up appointment for chronic pain and medication refill. She states her pain is located in her lower back radiating into her left lower extremity. She rates her pain 9. Her current exercise regime is walking and performing stretching exercises.  Ms. Mckenny Morphine equivalent is 40.00  MME. She  is also prescribed alprazolam. We have discussed the black box warning of using opioids and benzodiazepines. I highlighted the dangers of using these drugs together and discussed the adverse events including respiratory suppression, overdose, cognitive impairment and importance of compliance with current regimen. We will continue to monitor and adjust as indicated.  She is being closely monitored and under the care of her psychologist Dr Sima Matas.    Last Oral Swab was Performed on 10/15/2020, it was consistent.    Pain Inventory Average Pain 8 Pain Right Now 9 My pain is constant, sharp, burning, dull, stabbing, tingling, aching and spasms  In the last 24 hours, has pain interfered with the following? General activity 10 Relation with others 10 Enjoyment of life 10 What TIME of day is your pain at its worst? morning , daytime, evening and night Sleep (in general) Fair  Pain is worse with: walking, bending, sitting, inactivity, standing, unsure and some activites Pain improves with: rest, heat/ice, pacing activities, medication and injections Relief from Meds: 8  Family History  Problem Relation Age of Onset  . Cervical cancer Mother   . Diabetes Mother   . Stroke Mother   . Alzheimer's disease Mother   . Heart attack Father   . Stroke Father   . Colon cancer Father   . Anxiety disorder Maternal Aunt   . Depression Maternal Aunt   . Anxiety disorder Maternal Uncle   . Depression Maternal Uncle        suicide attempt by  gun shot wound to head. Survived  . Alcohol abuse Other   . Drug abuse Other   . Breast cancer Maternal Grandmother   . Diabetes Maternal Grandmother   . Breast cancer Paternal Aunt   . Colon polyps Sister        x 2   Social History   Socioeconomic History  . Marital status: Married    Spouse name: Not on file  . Number of children: 0  . Years of education: Not on file  . Highest education level: Not on file  Occupational History  . Occupation: disabled  Tobacco Use  . Smoking status: Never Smoker  . Smokeless tobacco: Never Used  Vaping Use  . Vaping Use: Never used  Substance and Sexual Activity  . Alcohol use: No    Alcohol/week: 0.0 standard drinks  . Drug use: No  . Sexual activity: Yes  Other Topics Concern  . Not on file  Social History Narrative   Pt has h.s. degree   Social Determinants of Health   Financial Resource Strain: Not on file  Food Insecurity: Not on file  Transportation Needs: Not on file  Physical Activity: Not on file  Stress: Not on file  Social Connections: Not on file   Past Surgical History:  Procedure Laterality Date  . ABDOMINAL HYSTERECTOMY  1999  . bunions removed  1988  . C4 C5 cage insertion  06/28/2013  . CHOLECYSTECTOMY  04/25/2008  . EDG  12/17/2004  .  ELECTROCARDIOGRAM  05/27/2007  . L5 S1 rod insertion  11/07/2012  . SKIN CANCER EXCISION    . SKIN CANCER EXCISION     several  . TONSILLECTOMY  1981  . TUBAL LIGATION  1997   Past Surgical History:  Procedure Laterality Date  . ABDOMINAL HYSTERECTOMY  1999  . bunions removed  1988  . C4 C5 cage insertion  06/28/2013  . CHOLECYSTECTOMY  04/25/2008  . EDG  12/17/2004  . ELECTROCARDIOGRAM  05/27/2007  . L5 S1 rod insertion  11/07/2012  . SKIN CANCER EXCISION    . SKIN CANCER EXCISION     several  . TONSILLECTOMY  1981  . TUBAL LIGATION  1997   Past Medical History:  Diagnosis Date  . Anemia   . ANXIETY 06/10/2007  . Cataract   . Chronic headaches   . Degenerative  arthritis of spine    back and neck  . Depression   . DYSLIPIDEMIA 06/28/2008  . Esophageal stricture   . Fibromyalgia   . Gallstones   . GERD 06/10/2007  . Hiatal hernia   . 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 HYPERTENSION 06/10/2007  . Irritable bowel syndrome 06/28/2008  . OSTEOARTHRITIS 06/10/2007  . Skin cancer    basal cell  . Tubular adenoma of colon    BP (!) 144/86   Pulse 65   Temp 99 F (37.2 C)   Ht 5\' 1"  (1.549 m)   Wt 168 lb (76.2 kg)   SpO2 95%   BMI 31.74 kg/m   Opioid Risk Score:   Fall Risk Score:  `1  Depression screen PHQ 2/9  Depression screen [facility] 2/9 11/12/2020 10/15/2020 09/11/2020 05/14/2020 01/18/2020 10/24/2019 10/18/2019  Decreased Interest 0 1 1 0 1 1 1   Down, Depressed, Hopeless 0 1 1 0 1 1 1   PHQ - 2 Score 0 2 2 0 2 2 2   Altered sleeping - - - - - 0 -  Tired, decreased energy - - - - - 3 -  Change in appetite - - - - - 3 -  Feeling bad or failure about yourself  - - - - - 0 -  Trouble concentrating - - - - - 0 -  Moving slowly or fidgety/restless - - - - - 0 -  Suicidal thoughts - - - - - 0 -  PHQ-9 Score - - - - - 8 -  Difficult doing work/chores - - - - - - -  Some recent data might be hidden    Review of Systems  Constitutional: Negative.   HENT: Negative.   Eyes: Negative.   Respiratory: Negative.   Cardiovascular: Negative.   Gastrointestinal: Negative.   Endocrine: Negative.   Genitourinary: Negative.   Musculoskeletal: Positive for arthralgias and gait problem.  Skin: Negative.   Allergic/Immunologic: Negative.   Neurological: Positive for headaches.  Psychiatric/Behavioral: The patient is nervous/anxious.   All other systems reviewed and are negative.      Objective:   Physical Exam Vitals and nursing note reviewed.  Constitutional:      Appearance: Normal appearance.  Cardiovascular:     Rate and Rhythm: Normal rate and regular rhythm.     Heart sounds: Normal heart sounds.  Pulmonary:     Effort: Pulmonary effort is normal.     Breath  sounds: Normal breath sounds.  Musculoskeletal:     Cervical back: Normal range of motion and neck supple.     Comments: Normal Muscle Bulk and Muscle Testing Reveals:  Upper Extremities: Full ROM and Muscle Strength 5/5  Lumbar Paraspinal Tenderness: L-3-L-5 Lower Extremities: Full ROM and Muscle Strength 5/5 Arises from Table with ease Narrow Based Gait   Skin:    General: Skin is warm and dry.  Neurological:     Mental Status: She is alert and oriented to person, place, and time.  Psychiatric:        Mood and Affect: Mood normal.        Behavior: Behavior normal.           Assessment & Plan:  1. History of chronic lumbar facet disease with spondylosis/DDD/ L4-5 spondylolisthesis.With L4-5 L5-S1 decompression stabilization:12/10/2020 Refilled:Hydrocodone 10/325mg  one tablet every 6 hours as needed #120. We will continue the opioid monitoring program, this consists of regular clinic visits, examinations, urine drug screen, pill counts as well as use of [place] Controlled Substance Reporting system. A 12 month History has been reviewed on the [place] Controlled Substance Reporting Systemon03/10/2020. 2. Depression with anxiety/ PTSD: Dr. Sima Matas Following.Continue Xanax,Abilifyand Effexor.12/10/2020 3. Cervical spondylosis: Stable at this time with no complaints. Continue Medication Regime.12/10/2020 4. 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 Continue with  slow weaning.12/10/2020. 5. Muscle Spasm: Continuecurrent medication regimen withFlexeril: May take 1-2 tablets at HS.12/10/2020 6. Left Lumbar Radiculitis:Continue HEP as Tolerated .Continue to Monitor.12/10/2020. 7. Left Knee Osteoarthritis:No complaints today.S/PLeft Knee Injectionon 01/15/2020with good relief noted.12/10/2020 8. Right Knee Pain:No complaints  today.Continue toalternate with ice and heat therapy. Continue to monitor.12/10/2020  F/U in 1 month

## 2020-12-23 DIAGNOSIS — K219 Gastro-esophageal reflux disease without esophagitis: Secondary | ICD-10-CM | POA: Diagnosis not present

## 2020-12-23 DIAGNOSIS — Z800 Family history of malignant neoplasm of digestive organs: Secondary | ICD-10-CM | POA: Diagnosis not present

## 2020-12-23 DIAGNOSIS — Z86010 Personal history of colonic polyps: Secondary | ICD-10-CM | POA: Diagnosis not present

## 2020-12-23 DIAGNOSIS — K644 Residual hemorrhoidal skin tags: Secondary | ICD-10-CM | POA: Diagnosis not present

## 2021-01-01 DIAGNOSIS — Z01812 Encounter for preprocedural laboratory examination: Secondary | ICD-10-CM | POA: Diagnosis not present

## 2021-01-06 ENCOUNTER — Ambulatory Visit: Payer: BC Managed Care – PPO | Admitting: Psychology

## 2021-01-06 DIAGNOSIS — Z800 Family history of malignant neoplasm of digestive organs: Secondary | ICD-10-CM | POA: Diagnosis not present

## 2021-01-06 DIAGNOSIS — D125 Benign neoplasm of sigmoid colon: Secondary | ICD-10-CM | POA: Diagnosis not present

## 2021-01-06 DIAGNOSIS — Z86010 Personal history of colonic polyps: Secondary | ICD-10-CM | POA: Diagnosis not present

## 2021-01-06 DIAGNOSIS — Z8371 Family history of colonic polyps: Secondary | ICD-10-CM | POA: Diagnosis not present

## 2021-01-09 ENCOUNTER — Encounter: Payer: Self-pay | Admitting: Registered Nurse

## 2021-01-09 ENCOUNTER — Other Ambulatory Visit: Payer: Self-pay

## 2021-01-09 ENCOUNTER — Encounter: Payer: BC Managed Care – PPO | Admitting: Registered Nurse

## 2021-01-09 VITALS — BP 121/70 | HR 64 | Temp 98.7°F | Ht 61.0 in | Wt 164.7 lb

## 2021-01-09 DIAGNOSIS — M5416 Radiculopathy, lumbar region: Secondary | ICD-10-CM

## 2021-01-09 DIAGNOSIS — G8929 Other chronic pain: Secondary | ICD-10-CM

## 2021-01-09 DIAGNOSIS — Z79891 Long term (current) use of opiate analgesic: Secondary | ICD-10-CM

## 2021-01-09 DIAGNOSIS — F0781 Postconcussional syndrome: Secondary | ICD-10-CM

## 2021-01-09 DIAGNOSIS — M797 Fibromyalgia: Secondary | ICD-10-CM | POA: Diagnosis not present

## 2021-01-09 DIAGNOSIS — G894 Chronic pain syndrome: Secondary | ICD-10-CM | POA: Diagnosis not present

## 2021-01-09 DIAGNOSIS — M47817 Spondylosis without myelopathy or radiculopathy, lumbosacral region: Secondary | ICD-10-CM | POA: Diagnosis not present

## 2021-01-09 DIAGNOSIS — M47816 Spondylosis without myelopathy or radiculopathy, lumbar region: Secondary | ICD-10-CM

## 2021-01-09 DIAGNOSIS — M961 Postlaminectomy syndrome, not elsewhere classified: Secondary | ICD-10-CM

## 2021-01-09 DIAGNOSIS — Z5181 Encounter for therapeutic drug level monitoring: Secondary | ICD-10-CM

## 2021-01-09 DIAGNOSIS — M546 Pain in thoracic spine: Secondary | ICD-10-CM | POA: Diagnosis not present

## 2021-01-09 DIAGNOSIS — M62838 Other muscle spasm: Secondary | ICD-10-CM | POA: Diagnosis not present

## 2021-01-09 MED ORDER — HYDROCODONE-ACETAMINOPHEN 10-325 MG PO TABS: 1.0000 | ORAL_TABLET | Freq: Four times a day (QID) | ORAL | 0 refills | Status: DC | PRN

## 2021-01-09 MED ORDER — METHYLPHENIDATE HCL 10 MG PO TABS
10.0000 mg | ORAL_TABLET | Freq: Two times a day (BID) | ORAL | 0 refills | Status: DC
Start: 2021-01-09 — End: 2021-01-29

## 2021-01-09 NOTE — Progress Notes (Signed)
Subjective:    Patient ID: [name], female    DOB: [birth year], 65 y.o.   MRN: [id number]  HPI: [name] is a 65 y.o. female who returns for follow up appointment for chronic pain and medication refill. She states her pain is located in her upper- lower back pain radiating into her left buttock and left lower extremity. She  rates her pain 8. Her  current exercise regime is walking and performing stretching exercises.  [name] Morphine equivalent is 40.00 MME. She is also prescribed Alprazolam. We have discussed the black box warning of using opioids and benzodiazepines. I highlighted the dangers of using these drugs together and discussed the adverse events including respiratory suppression, overdose, cognitive impairment and importance of compliance with current regimen. We will continue to monitor and adjust as indicated.   She  is being closely monitored and under the care of her psychologist Dr Sima Matas.   Last Oral Swab was Performed on 10/15/2020, it was consistent.  Pain Inventory Average Pain 8 Pain Right Now 8 My pain is sharp, burning, dull, stabbing, tingling and aching  In the last 24 hours, has pain interfered with the following? General activity 10 Relation with others 10 Enjoyment of life 10 What TIME of day is your pain at its worst? varies Sleep (in general) Fair  Pain is worse with: walking, bending, sitting, inactivity, standing, unsure and some activites Pain improves with: rest, heat/ice, pacing activities, medication and injections Relief from Meds: 9  Family History  Problem Relation Age of Onset  . Cervical cancer Mother   . Diabetes Mother   . Stroke Mother   . Alzheimer's disease Mother   . Heart attack Father   . Stroke Father   . Colon cancer Father   . Anxiety disorder Maternal Aunt   . Depression Maternal Aunt   . Anxiety disorder Maternal Uncle   . Depression Maternal Uncle        suicide attempt by gun shot wound to  head. Survived  . Alcohol abuse Other   . Drug abuse Other   . Breast cancer Maternal Grandmother   . Diabetes Maternal Grandmother   . Breast cancer Paternal Aunt   . Colon polyps Sister        x 2   Social History   Socioeconomic History  . Marital status: Married    Spouse name: Not on file  . Number of children: 0  . Years of education: Not on file  . Highest education level: Not on file  Occupational History  . Occupation: disabled  Tobacco Use  . Smoking status: Never Smoker  . Smokeless tobacco: Never Used  Vaping Use  . Vaping Use: Never used  Substance and Sexual Activity  . Alcohol use: No    Alcohol/week: 0.0 standard drinks  . Drug use: No  . Sexual activity: Yes  Other Topics Concern  . Not on file  Social History Narrative   Pt has h.s. degree   Social Determinants of Health   Financial Resource Strain: Not on file  Food Insecurity: Not on file  Transportation Needs: Not on file  Physical Activity: Not on file  Stress: Not on file  Social Connections: Not on file   Past Surgical History:  Procedure Laterality Date  . ABDOMINAL HYSTERECTOMY  1999  . bunions removed  1988  . C4 C5 cage insertion  06/28/2013  . CHOLECYSTECTOMY  04/25/2008  . EDG  12/17/2004  . ELECTROCARDIOGRAM  05/27/2007  . L5 S1 rod insertion  11/07/2012  . SKIN CANCER EXCISION    . SKIN CANCER EXCISION     several  . TONSILLECTOMY  1981  . TUBAL LIGATION  1997   Past Surgical History:  Procedure Laterality Date  . ABDOMINAL HYSTERECTOMY  1999  . bunions removed  1988  . C4 C5 cage insertion  06/28/2013  . CHOLECYSTECTOMY  04/25/2008  . EDG  12/17/2004  . ELECTROCARDIOGRAM  05/27/2007  . L5 S1 rod insertion  11/07/2012  . SKIN CANCER EXCISION    . SKIN CANCER EXCISION     several  . TONSILLECTOMY  1981  . TUBAL LIGATION  1997   Past Medical History:  Diagnosis Date  . Anemia   . ANXIETY 06/10/2007  . Cataract   . Chronic headaches   . Degenerative arthritis of spine     back and neck  . Depression   . DYSLIPIDEMIA 06/28/2008  . Esophageal stricture   . Fibromyalgia   . Gallstones   . GERD 06/10/2007  . Hiatal hernia   . 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 HYPERTENSION 06/10/2007  . Irritable bowel syndrome 06/28/2008  . OSTEOARTHRITIS 06/10/2007  . Skin cancer    basal cell  . Tubular adenoma of colon    BP 121/70   Pulse 64   Temp 98.7 F (37.1 C)   Ht 5\' 1"  (1.549 m)   Wt 164 lb 11.2 oz (74.7 kg)   SpO2 97%   BMI 31.12 kg/m   Opioid Risk Score:   Fall Risk Score:  `1  Depression screen PHQ 2/9  Depression screen [facility] 2/9 11/12/2020 10/15/2020 09/11/2020 05/14/2020 01/18/2020 10/24/2019 10/18/2019  Decreased Interest 0 1 1 0 1 1 1   Down, Depressed, Hopeless 0 1 1 0 1 1 1   PHQ - 2 Score 0 2 2 0 2 2 2   Altered sleeping - - - - - 0 -  Tired, decreased energy - - - - - 3 -  Change in appetite - - - - - 3 -  Feeling bad or failure about yourself  - - - - - 0 -  Trouble concentrating - - - - - 0 -  Moving slowly or fidgety/restless - - - - - 0 -  Suicidal thoughts - - - - - 0 -  PHQ-9 Score - - - - - 8 -  Difficult doing work/chores - - - - - - -  Some recent data might be hidden     Review of Systems  Constitutional: Negative.   HENT: Negative.   Eyes: Negative.   Respiratory: Negative.   Cardiovascular: Negative.   Gastrointestinal: Negative.   Endocrine: Negative.   Genitourinary: Negative.   Musculoskeletal: Positive for back pain and gait problem.       LEFT SHOULDER PAIN , ABDOMINAL PAIN,   Skin: Negative.   Allergic/Immunologic: Negative.   Hematological: Negative.   Psychiatric/Behavioral: Negative.        Objective:   Physical Exam Vitals and nursing note reviewed.  Constitutional:      Appearance: Normal appearance.  Cardiovascular:     Rate and Rhythm: Normal rate and regular rhythm.     Pulses: Normal pulses.     Heart sounds: Normal heart sounds.  Pulmonary:     Effort: Pulmonary effort is normal.     Breath sounds: Normal breath  sounds.  Musculoskeletal:     Cervical back: Normal range of motion and neck supple.  Comments: Normal Muscle Bulk and Muscle Testing Reveals:  Upper Extremities: Full ROM and Muscle Strength 5/5 Thoracic Paraspinal Tenderness: T-1-T-3 Lumbar Paraspinal Tenderness: L-3-L-5 Lower Extremities: Full ROM and Muscle Strength 5/5 Arises from chair with Ease Narrow Based Gait   Skin:    General: Skin is warm and dry.  Neurological:     Mental Status: She is alert and oriented to person, place, and time.  Psychiatric:        Mood and Affect: Mood normal.        Behavior: Behavior normal.           Assessment & Plan:  1. History of chronic lumbar facet disease with spondylosis/DDD/ L4-5 spondylolisthesis.With L4-5 L5-S1 decompression stabilization:01/09/2021 Refilled:Hydrocodone 10/325mg  one tablet every 6 hours as needed #120. We will continue the opioid monitoring program, this consists of regular clinic visits, examinations, urine drug screen, pill counts as well as use of [place] Controlled Substance Reporting system. A 12 month History has been reviewed on the [place] Controlled Substance Reporting Systemon03/31/2022. 2. Depression with anxiety/ PTSD: Dr. Sima Matas Following.Continue Xanax,Abilifyand Effexor.01/09/2021 3. Cervical spondylosis: Stable at this time with no complaints. Continue Medication Regime.01/09/2021 4. 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 Continuewithslow weaning.01/09/2021. 5. Muscle Spasm: Continuecurrent medication regimen withFlexeril: May take 1-2 tablets at HS.01/09/2021 6. Left Lumbar Radiculitis:Continue HEP as Tolerated .Continue to Monitor.01/09/2021. 7. Left Knee Osteoarthritis:No complaints today.S/PLeft Knee Injectionon 01/15/2020with good relief noted.01/09/2021 8. Right Knee Pain:No  complaints today.Continue toalternate with ice and heat therapy. Continue to monitor.01/09/2021  F/U in 1 month

## 2021-01-23 DIAGNOSIS — R8290 Unspecified abnormal findings in urine: Secondary | ICD-10-CM | POA: Diagnosis not present

## 2021-01-23 DIAGNOSIS — Z0000 Encounter for general adult medical examination without abnormal findings: Secondary | ICD-10-CM | POA: Diagnosis not present

## 2021-01-23 DIAGNOSIS — H6121 Impacted cerumen, right ear: Secondary | ICD-10-CM | POA: Diagnosis not present

## 2021-01-23 DIAGNOSIS — E039 Hypothyroidism, unspecified: Secondary | ICD-10-CM | POA: Diagnosis not present

## 2021-01-23 DIAGNOSIS — Z13220 Encounter for screening for lipoid disorders: Secondary | ICD-10-CM | POA: Diagnosis not present

## 2021-01-29 ENCOUNTER — Ambulatory Visit
Admission: RE | Admit: 2021-01-29 | Discharge: 2021-01-29 | Disposition: A | Discharge status: Home / Self Care | Payer: BC Managed Care – PPO | Source: Ambulatory Visit | Attending: Family Medicine | Admitting: Family Medicine

## 2021-01-29 ENCOUNTER — Other Ambulatory Visit: Payer: Self-pay

## 2021-01-29 ENCOUNTER — Encounter: Payer: BC Managed Care – PPO | Attending: Physical Medicine and Rehabilitation | Admitting: Registered Nurse

## 2021-01-29 ENCOUNTER — Encounter: Payer: Self-pay | Admitting: Registered Nurse

## 2021-01-29 VITALS — BP 151/82 | HR 63 | Temp 99.3°F | Ht 61.0 in | Wt 167.0 lb

## 2021-01-29 DIAGNOSIS — F0781 Postconcussional syndrome: Secondary | ICD-10-CM | POA: Diagnosis not present

## 2021-01-29 DIAGNOSIS — F411 Generalized anxiety disorder: Secondary | ICD-10-CM | POA: Diagnosis not present

## 2021-01-29 DIAGNOSIS — R413 Other amnesia: Secondary | ICD-10-CM

## 2021-01-29 DIAGNOSIS — Z1231 Encounter for screening mammogram for malignant neoplasm of breast: Secondary | ICD-10-CM | POA: Diagnosis not present

## 2021-01-29 DIAGNOSIS — Z79891 Long term (current) use of opiate analgesic: Secondary | ICD-10-CM

## 2021-01-29 DIAGNOSIS — M961 Postlaminectomy syndrome, not elsewhere classified: Secondary | ICD-10-CM | POA: Diagnosis not present

## 2021-01-29 DIAGNOSIS — M47817 Spondylosis without myelopathy or radiculopathy, lumbosacral region: Secondary | ICD-10-CM

## 2021-01-29 DIAGNOSIS — Z5181 Encounter for therapeutic drug level monitoring: Secondary | ICD-10-CM | POA: Diagnosis not present

## 2021-01-29 DIAGNOSIS — M797 Fibromyalgia: Secondary | ICD-10-CM | POA: Diagnosis not present

## 2021-01-29 DIAGNOSIS — G894 Chronic pain syndrome: Secondary | ICD-10-CM

## 2021-01-29 DIAGNOSIS — M5416 Radiculopathy, lumbar region: Secondary | ICD-10-CM | POA: Diagnosis not present

## 2021-01-29 DIAGNOSIS — M47816 Spondylosis without myelopathy or radiculopathy, lumbar region: Secondary | ICD-10-CM

## 2021-01-29 MED ORDER — METHYLPHENIDATE HCL 10 MG PO TABS: 10.0000 mg | ORAL_TABLET | Freq: Two times a day (BID) | ORAL | 0 refills | Status: DC

## 2021-01-29 MED ORDER — ALPRAZOLAM 1 MG PO TABS: ORAL_TABLET | ORAL | 3 refills | Status: DC

## 2021-01-29 MED ORDER — HYDROCODONE-ACETAMINOPHEN 10-325 MG PO TABS
1.0000 | ORAL_TABLET | Freq: Four times a day (QID) | ORAL | 0 refills | Status: DC | PRN
Start: 2021-01-29 — End: 2021-03-05

## 2021-01-29 NOTE — Progress Notes (Signed)
Subjective:    Patient ID: [name], female    DOB: [birth year], 65 y.o.   MRN: [id number]  HPI: [name] is a 65 y.o. female who returns for follow up appointment for chronic pain and medication refill. She states her pain is located in her lower back radiating into her left lower extremity. She  rates her pain 9. Her current exercise regime is walking and performing stretching exercises.  [name] reports she has COVID Brain Fog and her PCP following, also reports memory changes. Refuses neurology referral at [facility] time, states her PCP following. She was instructed not to drive for safety precautions, she verbalizes understanding. She was instructed to call office when she gets home, she verbalizes understanding.   [name] Morphine equivalent is 40.00 MME.  She  is also prescribed Alprazolam. We have discussed the black box warning of using opioids and benzodiazepines. I highlighted the dangers of using these drugs together and discussed the adverse events including respiratory suppression, overdose, cognitive impairment and importance of compliance with current regimen. We will continue to monitor and adjust as indicated.  She  is being closely monitored and under the care of her psychologist Dr Sima Matas.   Last Oral Swab was Performed on 10/15/2020, it was consistent.    Pain Inventory Average Pain 9 Pain Right Now 9 My pain is sharp, burning, dull, stabbing, tingling and aching  In the last 24 hours, has pain interfered with the following? General activity 10 Relation with others 10 Enjoyment of life 10 What TIME of day is your pain at its worst? varies Sleep (in general) Fair  Pain is worse with: walking, bending, sitting, inactivity, standing and some activites Pain improves with: rest, heat/ice, pacing activities, medication and injections Relief from Meds: 9  Family History  Problem Relation Age of Onset  . Cervical cancer Mother   . Diabetes Mother    . Stroke Mother   . Alzheimer's disease Mother   . Heart attack Father   . Stroke Father   . Colon cancer Father   . Anxiety disorder Maternal Aunt   . Depression Maternal Aunt   . Anxiety disorder Maternal Uncle   . Depression Maternal Uncle        suicide attempt by gun shot wound to head. Survived  . Alcohol abuse Other   . Drug abuse Other   . Breast cancer Maternal Grandmother   . Diabetes Maternal Grandmother   . Breast cancer Paternal Aunt   . Colon polyps Sister        x 2   Social History   Socioeconomic History  . Marital status: Married    Spouse name: Not on file  . Number of children: 0  . Years of education: Not on file  . Highest education level: Not on file  Occupational History  . Occupation: disabled  Tobacco Use  . Smoking status: Never Smoker  . Smokeless tobacco: Never Used  Vaping Use  . Vaping Use: Never used  Substance and Sexual Activity  . Alcohol use: No    Alcohol/week: 0.0 standard drinks  . Drug use: No  . Sexual activity: Yes  Other Topics Concern  . Not on file  Social History Narrative   Pt has h.s. degree   Social Determinants of Health   Financial Resource Strain: Not on file  Food Insecurity: Not on file  Transportation Needs: Not on file  Physical Activity: Not on file  Stress: Not on file  Social Connections: Not on file   Past Surgical History:  Procedure Laterality Date  . ABDOMINAL HYSTERECTOMY  1999  . bunions removed  1988  . C4 C5 cage insertion  06/28/2013  . CHOLECYSTECTOMY  04/25/2008  . EDG  12/17/2004  . ELECTROCARDIOGRAM  05/27/2007  . L5 S1 rod insertion  11/07/2012  . SKIN CANCER EXCISION    . SKIN CANCER EXCISION     several  . TONSILLECTOMY  1981  . TUBAL LIGATION  1997   Past Surgical History:  Procedure Laterality Date  . ABDOMINAL HYSTERECTOMY  1999  . bunions removed  1988  . C4 C5 cage insertion  06/28/2013  . CHOLECYSTECTOMY  04/25/2008  . EDG  12/17/2004  . ELECTROCARDIOGRAM  05/27/2007  .  L5 S1 rod insertion  11/07/2012  . SKIN CANCER EXCISION    . SKIN CANCER EXCISION     several  . TONSILLECTOMY  1981  . TUBAL LIGATION  1997   Past Medical History:  Diagnosis Date  . Anemia   . ANXIETY 06/10/2007  . Cataract   . Chronic headaches   . Degenerative arthritis of spine    back and neck  . Depression   . DYSLIPIDEMIA 06/28/2008  . Esophageal stricture   . Fibromyalgia   . Gallstones   . GERD 06/10/2007  . Hiatal hernia   . 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 HYPERTENSION 06/10/2007  . Irritable bowel syndrome 06/28/2008  . OSTEOARTHRITIS 06/10/2007  . Skin cancer    basal cell  . Tubular adenoma of colon    BP (!) 151/82   Pulse 63   Temp 99.3 F (37.4 C)   Ht 5\' 1"  (1.549 m)   Wt 167 lb (75.8 kg)   SpO2 97%   BMI 31.55 kg/m   Opioid Risk Score:   Fall Risk Score:  `1  Depression screen PHQ 2/9  Depression screen [facility] 2/9 01/09/2021 11/12/2020 10/15/2020 09/11/2020 05/14/2020 01/18/2020 10/24/2019  Decreased Interest 1 0 1 1 0 1 1  Down, Depressed, Hopeless 1 0 1 1 0 1 1  PHQ - 2 Score 2 0 2 2 0 2 2  Altered sleeping - - - - - - 0  Tired, decreased energy - - - - - - 3  Change in appetite - - - - - - 3  Feeling bad or failure about yourself  - - - - - - 0  Trouble concentrating - - - - - - 0  Moving slowly or fidgety/restless - - - - - - 0  Suicidal thoughts - - - - - - 0  PHQ-9 Score - - - - - - 8  Difficult doing work/chores - - - - - - -  Some recent data might be hidden    Review of Systems  Constitutional: Negative.   HENT: Negative.   Eyes: Negative.   Respiratory: Negative.   Cardiovascular: Negative.   Gastrointestinal: Negative.   Endocrine: Negative.   Genitourinary: Negative.   Musculoskeletal: Positive for back pain.  Skin: Negative.   Allergic/Immunologic: Negative.   Neurological: Negative.   Hematological: Negative.   Psychiatric/Behavioral: Negative.   All other systems reviewed and are negative.      Objective:   Physical Exam Vitals and  nursing note reviewed.  Constitutional:      Appearance: Normal appearance.  Cardiovascular:     Rate and Rhythm: Normal rate and regular rhythm.     Pulses: Normal pulses.     Heart sounds:  Normal heart sounds.  Pulmonary:     Effort: Pulmonary effort is normal.     Breath sounds: Normal breath sounds.  Musculoskeletal:     Cervical back: Normal range of motion and neck supple.     Comments: Normal Muscle Bulk and Muscle Testing Reveals:  Upper Extremities: Full ROM and Muscle Strength 5/5 Lumbar Paraspinal Tenderness: L-3-L-5 Lower Extremities: Full ROM and Muscle Strength 5/5 Arises from chair with ease Narrow Based  Gait   Skin:    General: Skin is warm and dry.  Neurological:     Mental Status: She is alert and oriented to person, place, and time.  Psychiatric:        Mood and Affect: Mood normal.        Behavior: Behavior normal.           Assessment & Plan:  1. History of chronic lumbar facet disease with spondylosis/DDD/ L4-5 spondylolisthesis.With L4-5 L5-S1 decompression stabilization:01/29/2021 Refilled:Hydrocodone 10/325mg  one tablet every 6 hours as needed #120. We will continue the opioid monitoring program, this consists of regular clinic visits, examinations, urine drug screen, pill counts as well as use of [place] Controlled Substance Reporting system. A 12 month History has been reviewed on the [place] Controlled Substance Reporting Systemon04/20/2022. 2. Depression with anxiety/ PTSD: Dr. Sima Matas Following.Continue Xanax,Abilifyand Effexor.01/29/2021 3. Cervical spondylosis: Stable at this time with no complaints. Continue Medication Regime.01/29/2021 4. 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 Continuewithslow weaning.01/29/2021. 5. Muscle Spasm: Continuecurrent medication regimen withFlexeril: May take 1-2  tablets at HS.01/29/2021 6. Left Lumbar Radiculitis:Continue HEP as Tolerated .Continue to Monitor.01/29/2021. 7. Left Knee Osteoarthritis:No complaints today.S/PLeft Knee Injectionon 01/15/2020with good relief noted.01/29/2021 8. Right Knee Pain:No complaints today.Continue toalternate with ice and heat therapy. Continue to monitor.01/29/2021 9. Memory Chance Since her Diagnosis with COVID-19: PCP Following. She refuses Neurology . She was instructed not to drive for Safety Precautions, she verbalizes understanding. Instructed to call office when she arrives to her home today. She verbalizes understanding.   F/U in 1 month

## 2021-02-03 ENCOUNTER — Other Ambulatory Visit: Payer: Self-pay

## 2021-02-03 ENCOUNTER — Encounter: Payer: Self-pay | Admitting: Psychology

## 2021-02-03 ENCOUNTER — Encounter: Payer: BC Managed Care – PPO | Admitting: Psychology

## 2021-02-03 DIAGNOSIS — Z5181 Encounter for therapeutic drug level monitoring: Secondary | ICD-10-CM | POA: Diagnosis not present

## 2021-02-03 DIAGNOSIS — R413 Other amnesia: Secondary | ICD-10-CM | POA: Diagnosis not present

## 2021-02-03 DIAGNOSIS — F0781 Postconcussional syndrome: Secondary | ICD-10-CM | POA: Diagnosis not present

## 2021-02-03 DIAGNOSIS — M961 Postlaminectomy syndrome, not elsewhere classified: Secondary | ICD-10-CM

## 2021-02-03 DIAGNOSIS — G894 Chronic pain syndrome: Secondary | ICD-10-CM

## 2021-02-03 DIAGNOSIS — M47817 Spondylosis without myelopathy or radiculopathy, lumbosacral region: Secondary | ICD-10-CM | POA: Diagnosis not present

## 2021-02-03 DIAGNOSIS — M5416 Radiculopathy, lumbar region: Secondary | ICD-10-CM | POA: Diagnosis not present

## 2021-02-03 DIAGNOSIS — F411 Generalized anxiety disorder: Secondary | ICD-10-CM | POA: Diagnosis not present

## 2021-02-03 DIAGNOSIS — M797 Fibromyalgia: Secondary | ICD-10-CM | POA: Diagnosis not present

## 2021-02-03 DIAGNOSIS — Z79891 Long term (current) use of opiate analgesic: Secondary | ICD-10-CM | POA: Diagnosis not present

## 2021-02-03 DIAGNOSIS — M47816 Spondylosis without myelopathy or radiculopathy, lumbar region: Secondary | ICD-10-CM | POA: Diagnosis not present

## 2021-02-03 NOTE — Progress Notes (Signed)
Patient:                           [name]           DOB:                               [birth year]  MR Number:                  [id number]  Location:                        [facility] [address] [place] [id number] [place] [id number] Dept: [phone]  Start:  3 PM End:                                4 PM  Today's visit was an in person visit was conducted in my outpatient clinic office with the patient myself present.  Provider/Observer:                           Edgardo Roys PSYD  Chief Complaint:                                   Chief Complaint  Patient presents with  . Post-Traumatic Stress Disorder  . Pain  . Depression  . Memory Loss  . Anxiety    Reason For Service:                         [name] is a 65 year old female referred by Dr. Naaman Plummer due to issues of coping and dealing with a number of stressors. She has a history of postconcussion syndrome, chronic pain syndrome as well as PTSD.  The patient has been dealing with significant anxiety and depression along with a history of PTSD and postconcussion syndrome. The patient was referred for psychotherapeutic interventions primarily because of the issues of depression and coping with family stress.  The above reason for service was reviewed and remains accurate.  The patient reports that there has been significant improvements in psychosocial issues but there continues to be a lot of stress particularly around issues related to her mother's dementia and the mother not recognizing the patient.  The above reason for service was reviewed and remains applicable for the current visit.  The patient has been having more depressive symptoms with increased psychosocial stressors including her mother at end stages of Alzheimer's Dementia.  The mother is living with patient's sister and while the mother  likely needs a SNF the sister insists on taking care of the mother and allowing the mother to be at home when she dies.  However, this results in patient having to help the sister in significant ways including cleaning the sister's house and caring for mother that has significant medical and neurological needs.    The above reason for service has been reviewed and remains applicable for the current visit.  I have not seen the patient since May of this year.  The patient's mother has passed away since I saw her and things went relatively smoothly after the  death of her mother as the older sister took over the finalization of the will and a state.  The patient continues to have conflicts with her other sister as the other sister is described as having traits consistent with significant narcissistic personality disorder.  The patient is continued to have significant pain which limits her physical ability.  She still has some residual effects of her postconcussion syndrome but the primary issue she struggles with is her pain.  Interventions Strategy:                    Cognitive/behavioral psychotherapeutic interventions  Participation Level:                           The patient was appropriate and active during current visit with good mental status.    Participation Quality:                       Appropriate and attentive                          Behavioral Observation:                  Patient was well-groomed, alert and appropriate in her interactions throughout the visit.  Current Psychosocial Factors:        The patient reports that the complex between she and one of her sister helps improve significantly and there is no longer the conflicts that were brewing between the patient and sister regarding care of their mother who is now passed away.  The patient is continuing to have a lot of stressors that have come more to the forefront with her relationship with her husband.  The husband has  essentially been coming home from work each day and going to sleep and then waking up at 9:00 expecting food to be prepared in her doing everything around the house with no input from him.  Because of the patient's physical limitations it is hard for her to do a lot of physical things around the house.  PTSD remains an issue when the patient is out and about doing other things.   Content of Session:                           Reviewed current symptoms and continued to work on issues of depression, pain, PTSD and anxiety.  Reviewed current symptoms and continue to work on therapeutic issues around pain, PTSD, and depression/anxiety.  Current Status:                                   T the patient reports that her coping skills and strategies have continued to improve and that she is doing much better as far as her interactions.  She reports that her anxiety and depressive symptoms have been improving.  Patient Progress:                               The patient continues to show improvement in her overall functioning.  Impression/Diagnosis:                     [name] is a 65 year old female referred by Dr. Naaman Plummer due to issues  of coping and dealing with a number of stressors. She has a history of postconcussion syndrome, chronic pain syndrome as well as PTSD. The patient describes significant difficulty with issues related to her family and the family estate. There were issues when her father passed away where an older sister was in charge of everything and all of his possessions were been ago to this older sister. However, the middle sister began having trouble and when the patient could not help the sister rejected the patient. There've been a number of family conflicts going on including a niece who got in trouble with doing drugs and her excuse to the patient's sister was that the patient was the one who started her doing drugs and giving her money. This is not accurate.  T the patient reports  that she managed the death of her mother fairly well.  She continues to have significant pain symptoms, PTSD symptoms and postconcussive symptoms but has been managing better and actively working on therapeutic interventions we have addressed.  Diagnosis:   Lumbar post-laminectomy syndrome  Left lumbar radiculitis  Post concussion syndrome  Memory changes  Chronic pain syndrome  GAD (generalized anxiety disorder)

## 2021-02-05 DIAGNOSIS — L111 Transient acantholytic dermatosis [Grover]: Secondary | ICD-10-CM | POA: Diagnosis not present

## 2021-02-05 DIAGNOSIS — L570 Actinic keratosis: Secondary | ICD-10-CM | POA: Diagnosis not present

## 2021-02-05 DIAGNOSIS — L814 Other melanin hyperpigmentation: Secondary | ICD-10-CM | POA: Diagnosis not present

## 2021-02-05 DIAGNOSIS — L820 Inflamed seborrheic keratosis: Secondary | ICD-10-CM | POA: Diagnosis not present

## 2021-02-15 ENCOUNTER — Other Ambulatory Visit: Payer: Self-pay | Admitting: Registered Nurse

## 2021-02-20 ENCOUNTER — Other Ambulatory Visit: Payer: Self-pay | Admitting: Family Medicine

## 2021-03-05 ENCOUNTER — Encounter: Payer: Self-pay | Admitting: Registered Nurse

## 2021-03-05 ENCOUNTER — Encounter: Payer: BC Managed Care – PPO | Attending: Physical Medicine and Rehabilitation | Admitting: Registered Nurse

## 2021-03-05 ENCOUNTER — Other Ambulatory Visit: Payer: Self-pay

## 2021-03-05 VITALS — BP 137/81 | HR 63 | Temp 98.4°F | Ht 61.0 in | Wt 167.8 lb

## 2021-03-05 DIAGNOSIS — M797 Fibromyalgia: Secondary | ICD-10-CM | POA: Diagnosis not present

## 2021-03-05 DIAGNOSIS — Z79891 Long term (current) use of opiate analgesic: Secondary | ICD-10-CM | POA: Insufficient documentation

## 2021-03-05 DIAGNOSIS — F0781 Postconcussional syndrome: Secondary | ICD-10-CM | POA: Insufficient documentation

## 2021-03-05 DIAGNOSIS — M5416 Radiculopathy, lumbar region: Secondary | ICD-10-CM

## 2021-03-05 DIAGNOSIS — G894 Chronic pain syndrome: Secondary | ICD-10-CM | POA: Diagnosis not present

## 2021-03-05 DIAGNOSIS — Z5181 Encounter for therapeutic drug level monitoring: Secondary | ICD-10-CM | POA: Diagnosis not present

## 2021-03-05 DIAGNOSIS — M961 Postlaminectomy syndrome, not elsewhere classified: Secondary | ICD-10-CM | POA: Diagnosis not present

## 2021-03-05 DIAGNOSIS — M47817 Spondylosis without myelopathy or radiculopathy, lumbosacral region: Secondary | ICD-10-CM | POA: Insufficient documentation

## 2021-03-05 DIAGNOSIS — M47816 Spondylosis without myelopathy or radiculopathy, lumbar region: Secondary | ICD-10-CM | POA: Diagnosis not present

## 2021-03-05 MED ORDER — METHYLPHENIDATE HCL 10 MG PO TABS
10.0000 mg | ORAL_TABLET | Freq: Two times a day (BID) | ORAL | 0 refills | Status: DC
Start: 2021-03-05 — End: 2021-04-02

## 2021-03-05 MED ORDER — HYDROCODONE-ACETAMINOPHEN 10-325 MG PO TABS: 1.0000 | ORAL_TABLET | Freq: Four times a day (QID) | ORAL | 0 refills | Status: DC | PRN

## 2021-03-05 NOTE — Progress Notes (Signed)
Subjective:    Patient ID: [name], female    DOB: Jun 28, 1956, 65 y.o.   MRN: [id number]  HPI : [name] is a 65 y.o. female who returns for follow up appointment for chronic pain and medication refill. She states her pain is located in her lower back radiating into her left lower extremity. She rates her pain 9. Her current exercise regime is walking and performing stretching exercises.  Ms. Andujo Morphine equivalent is 40.00 MME.  UDS ordered today.    Pain Inventory Average Pain 9 Pain Right Now 9 My pain is sharp, burning, dull, stabbing, tingling and aching  In the last 24 hours, has pain interfered with the following? General activity 10 Relation with others 9 Enjoyment of life 10 What TIME of day is your pain at its worst? varies Sleep (in general) Fair  Pain is worse with: walking, bending, sitting, inactivity, standing, unsure and some activites Pain improves with: rest, heat/ice, pacing activities, medication and injections Relief from Meds: 7  Family History  Problem Relation Age of Onset  . Cervical cancer Mother   . Diabetes Mother   . Stroke Mother   . Alzheimer's disease Mother   . Heart attack Father   . Stroke Father   . Colon cancer Father   . Anxiety disorder Maternal Aunt   . Depression Maternal Aunt   . Anxiety disorder Maternal Uncle   . Depression Maternal Uncle        suicide attempt by gun shot wound to head. Survived  . Alcohol abuse Other   . Drug abuse Other   . Breast cancer Maternal Grandmother   . Diabetes Maternal Grandmother   . Breast cancer Paternal Aunt   . Colon polyps Sister        x 2   Social History   Socioeconomic History  . Marital status: Married    Spouse name: Not on file  . Number of children: 0  . Years of education: Not on file  . Highest education level: Not on file  Occupational History  . Occupation: disabled  Tobacco Use  . Smoking status: Never Smoker  . Smokeless tobacco: Never  Used  Vaping Use  . Vaping Use: Never used  Substance and Sexual Activity  . Alcohol use: No    Alcohol/week: 0.0 standard drinks  . Drug use: No  . Sexual activity: Yes  Other Topics Concern  . Not on file  Social History Narrative   Pt has h.s. degree   Social Determinants of Health   Financial Resource Strain: Not on file  Food Insecurity: Not on file  Transportation Needs: Not on file  Physical Activity: Not on file  Stress: Not on file  Social Connections: Not on file   Past Surgical History:  Procedure Laterality Date  . ABDOMINAL HYSTERECTOMY  1999  . bunions removed  1988  . C4 C5 cage insertion  06/28/2013  . CHOLECYSTECTOMY  04/25/2008  . EDG  12/17/2004  . ELECTROCARDIOGRAM  05/27/2007  . L5 S1 rod insertion  11/07/2012  . SKIN CANCER EXCISION    . SKIN CANCER EXCISION     several  . TONSILLECTOMY  1981  . TUBAL LIGATION  1997   Past Surgical History:  Procedure Laterality Date  . ABDOMINAL HYSTERECTOMY  1999  . bunions removed  1988  . C4 C5 cage insertion  06/28/2013  . CHOLECYSTECTOMY  04/25/2008  . EDG  12/17/2004  . ELECTROCARDIOGRAM  05/27/2007  .  L5 S1 rod insertion  11/07/2012  . SKIN CANCER EXCISION    . SKIN CANCER EXCISION     several  . TONSILLECTOMY  1981  . TUBAL LIGATION  1997   Past Medical History:  Diagnosis Date  . Anemia   . ANXIETY 06/10/2007  . Cataract   . Chronic headaches   . Degenerative arthritis of spine    back and neck  . Depression   . DYSLIPIDEMIA 06/28/2008  . Esophageal stricture   . Fibromyalgia   . Gallstones   . GERD 06/10/2007  . Hiatal hernia   . 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 HYPERTENSION 06/10/2007  . Irritable bowel syndrome 06/28/2008  . OSTEOARTHRITIS 06/10/2007  . Skin cancer    basal cell  . Tubular adenoma of colon    BP 137/81   Pulse 63   Temp 98.4 F (36.9 C)   Ht 5\' 1"  (1.549 m)   Wt 167 lb 12.8 oz (76.1 kg)   SpO2 98%   BMI 31.71 kg/m   Opioid Risk Score:   Fall Risk Score:  `1  Depression screen  PHQ 2/9  Depression screen [facility] 2/9 03/05/2021 01/09/2021 11/12/2020 10/15/2020 09/11/2020 05/14/2020 01/18/2020  Decreased Interest 0 1 0 1 1 0 1  Down, Depressed, Hopeless 0 1 0 1 1 0 1  PHQ - 2 Score 0 2 0 2 2 0 2  Altered sleeping - - - - - - -  Tired, decreased energy - - - - - - -  Change in appetite - - - - - - -  Feeling bad or failure about yourself  - - - - - - -  Trouble concentrating - - - - - - -  Moving slowly or fidgety/restless - - - - - - -  Suicidal thoughts - - - - - - -  PHQ-9 Score - - - - - - -  Difficult doing work/chores - - - - - - -  Some recent data might be hidden    Review of Systems  Constitutional: Negative.   HENT: Negative.   Eyes: Negative.   Respiratory: Negative.   Cardiovascular: Negative.   Gastrointestinal: Negative.   Endocrine: Negative.   Musculoskeletal: Positive for back pain.  Skin: Negative.   Allergic/Immunologic: Negative.   Neurological: Negative.   Hematological: Negative.   Psychiatric/Behavioral: Negative.   All other systems reviewed and are negative.      Objective:   Physical Exam Vitals and nursing note reviewed.  Constitutional:      Appearance: Normal appearance.  Cardiovascular:     Rate and Rhythm: Normal rate and regular rhythm.     Pulses: Normal pulses.     Heart sounds: Normal heart sounds.  Pulmonary:     Effort: Pulmonary effort is normal.     Breath sounds: Normal breath sounds.  Musculoskeletal:     Cervical back: Normal range of motion and neck supple.     Comments: Normal Muscle Bulk and Muscle Testing Reveals:  Upper Extremities: Full ROM and Muscle Strength 5/5 Lumbar Paraspinal Tenderness : L-3-L-5 Lower Extremities: Full ROM and Muscle Strength 5/5 Arises from chair  With ease Narrow Based Gait   Skin:    General: Skin is warm and dry.  Neurological:     Mental Status: She is alert and oriented to person, place, and time.  Psychiatric:        Mood and Affect: Mood normal.        Behavior:  Behavior  normal.           Assessment & Plan:  1. History of chronic lumbar facet disease with spondylosis/DDD/ L4-5 spondylolisthesis.With L4-5 L5-S1 decompression stabilization:03/05/2021 Refilled:Hydrocodone 10/325mg  one tablet every 6 hours as needed #120. We will continue the opioid monitoring program, this consists of regular clinic visits, examinations, urine drug screen, pill counts as well as use of [place] Controlled Substance Reporting system. A 12 month History has been reviewed on the [place] Controlled Substance Reporting Systemon05/25/2022. 2. Depression with anxiety/ PTSD: Dr. Sima Matas Following.Continue Xanax,Abilifyand Effexor.03/05/2021 3. Cervical spondylosis: Stable at this time with no complaints. Continue Medication Regime.03/05/2021 4. 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 Continuewithslow weaning.03/05/2021. 5. Muscle Spasm: Continuecurrent medication regimen withFlexeril: May take 1-2 tablets at HS.03/05/2021 6. Left Lumbar Radiculitis:Continue HEP as Tolerated .Continue to Monitor.03/05/2021. 7. Left Knee Osteoarthritis:No complaints today.S/PLeft Knee Injectionon 01/15/2020with good relief noted.03/05/2021 8. Right Knee Pain:No complaints today.Continue toalternate with ice and heat therapy. Continue to monitor.03/05/2021  F/U in 1 month

## 2021-03-12 LAB — TOXASSURE SELECT,+ANTIDEPR,UR

## 2021-03-20 ENCOUNTER — Telehealth: Payer: Self-pay | Admitting: *Deleted

## 2021-03-20 NOTE — Telephone Encounter (Signed)
Urine drug screen for this encounter is consistent for prescribed medication 

## 2021-04-02 ENCOUNTER — Encounter: Payer: BC Managed Care – PPO | Attending: Physical Medicine and Rehabilitation | Admitting: Registered Nurse

## 2021-04-02 ENCOUNTER — Other Ambulatory Visit: Payer: Self-pay

## 2021-04-02 VITALS — BP 109/72 | HR 68 | Temp 98.6°F | Ht 61.0 in | Wt 165.0 lb

## 2021-04-02 DIAGNOSIS — G894 Chronic pain syndrome: Secondary | ICD-10-CM | POA: Diagnosis not present

## 2021-04-02 DIAGNOSIS — M961 Postlaminectomy syndrome, not elsewhere classified: Secondary | ICD-10-CM | POA: Insufficient documentation

## 2021-04-02 DIAGNOSIS — F411 Generalized anxiety disorder: Secondary | ICD-10-CM | POA: Diagnosis not present

## 2021-04-02 DIAGNOSIS — M5416 Radiculopathy, lumbar region: Secondary | ICD-10-CM

## 2021-04-02 DIAGNOSIS — F0781 Postconcussional syndrome: Secondary | ICD-10-CM

## 2021-04-02 DIAGNOSIS — M47817 Spondylosis without myelopathy or radiculopathy, lumbosacral region: Secondary | ICD-10-CM

## 2021-04-02 DIAGNOSIS — M47816 Spondylosis without myelopathy or radiculopathy, lumbar region: Secondary | ICD-10-CM | POA: Insufficient documentation

## 2021-04-02 DIAGNOSIS — Z79891 Long term (current) use of opiate analgesic: Secondary | ICD-10-CM | POA: Diagnosis not present

## 2021-04-02 DIAGNOSIS — M797 Fibromyalgia: Secondary | ICD-10-CM | POA: Insufficient documentation

## 2021-04-02 DIAGNOSIS — Z5181 Encounter for therapeutic drug level monitoring: Secondary | ICD-10-CM | POA: Diagnosis not present

## 2021-04-02 MED ORDER — METHYLPHENIDATE HCL 10 MG PO TABS: 10.0000 mg | ORAL_TABLET | Freq: Two times a day (BID) | ORAL | 0 refills | Status: DC

## 2021-04-02 MED ORDER — HYDROCODONE-ACETAMINOPHEN 10-325 MG PO TABS: 1.0000 | ORAL_TABLET | Freq: Four times a day (QID) | ORAL | 0 refills | Status: DC | PRN

## 2021-04-02 NOTE — Progress Notes (Signed)
Subjective:    Patient ID: [name], female    DOB: Sep 05, 1956, 65 y.o.   MRN: [id number]  HPI: [name] is a 65 y.o. female who returns for follow up appointment for chronic pain and medication refill. She states her pain is located in her lower back radiating into her left lower extremity. She rates her pain 9. Her current exercise regime is walking and performing stretching exercises.  Ms. P[place]thress Morphine equivalent is 40.00  MME. She is also prescribed Alprazolam . We have discussed the black box warning of using opioids and benzodiazepines. I highlighted the dangers of using these drugs together and discussed the adverse events including respiratory suppression, overdose, cognitive impairment and importance of compliance with current regimen. We will continue to monitor and adjust as indicated.  she is being closely monitored and under the care of her psychologist.     Last UDS was Performed on 03/05/2021, it was consistent.     Pain Inventory Average Pain 9 Pain Right Now 9 My pain is sharp, burning, dull, stabbing, tingling, and aching  In the last 24 hours, has pain interfered with the following? General activity 10 Relation with others 10 Enjoyment of life 10 What TIME of day is your pain at its worst? varies Sleep (in general) Fair  Pain is worse with: walking, bending, sitting, inactivity, and standing Pain improves with: rest, heat/ice, pacing activities, medication, and injections Relief from Meds: 8  Family History  Problem Relation Age of Onset   Cervical cancer Mother    Diabetes Mother    Stroke Mother    Alzheimer's disease Mother    Heart attack Father    Stroke Father    Colon cancer Father    Anxiety disorder Maternal Aunt    Depression Maternal Aunt    Anxiety disorder Maternal Uncle    Depression Maternal Uncle        suicide attempt by gun shot wound to head. Survived   Alcohol abuse Other    Drug abuse Other    Breast cancer  Maternal Grandmother    Diabetes Maternal Grandmother    Breast cancer Paternal Aunt    Colon polyps Sister        x 2   Social History   Socioeconomic History   Marital status: Married    Spouse name: Not on file   Number of children: 0   Years of education: Not on file   Highest education level: Not on file  Occupational History   Occupation: disabled  Tobacco Use   Smoking status: Never   Smokeless tobacco: Never  Vaping Use   Vaping Use: Never used  Substance and Sexual Activity   Alcohol use: No    Alcohol/week: 0.0 standard drinks   Drug use: No   Sexual activity: Yes  Other Topics Concern   Not on file  Social History Narrative   Pt has h.s. degree   Social Determinants of Health   Financial Resource Strain: Not on file  Food Insecurity: Not on file  Transportation Needs: Not on file  Physical Activity: Not on file  Stress: Not on file  Social Connections: Not on file   Past Surgical History:  Procedure Laterality Date   ABDOMINAL HYSTERECTOMY  1999   bunions removed  1988   C4 C5 cage insertion  06/28/2013   CHOLECYSTECTOMY  04/25/2008   EDG  12/17/2004   ELECTROCARDIOGRAM  05/27/2007   L5 S1 rod insertion  11/07/2012  SKIN CANCER EXCISION     SKIN CANCER EXCISION     several   TONSILLECTOMY  1981   TUBAL LIGATION  1997   Past Surgical History:  Procedure Laterality Date   ABDOMINAL HYSTERECTOMY  1999   bunions removed  1988   C4 C5 cage insertion  06/28/2013   CHOLECYSTECTOMY  04/25/2008   EDG  12/17/2004   ELECTROCARDIOGRAM  05/27/2007   L5 S1 rod insertion  11/07/2012   SKIN CANCER EXCISION     SKIN CANCER EXCISION     several   TONSILLECTOMY  1981   TUBAL LIGATION  1997   Past Medical History:  Diagnosis Date   Anemia    ANXIETY 06/10/2007   Cataract    Chronic headaches    Degenerative arthritis of spine    back and neck   Depression    DYSLIPIDEMIA 06/28/2008   Esophageal stricture    Fibromyalgia    Gallstones    GERD 06/10/2007    Hiatal hernia    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   HYPERTENSION 06/10/2007   Irritable bowel syndrome 06/28/2008   OSTEOARTHRITIS 06/10/2007   Skin cancer    basal cell   Tubular adenoma of colon    BP 109/72 (BP Location: Left Arm, Patient Position: Sitting, Cuff Size: Normal)   Pulse 68   Temp 98.6 F (37 C) (Oral)   Ht 5\' 1"  (1.549 m)   Wt 165 lb (74.8 kg)   SpO2 94%   BMI 31.18 kg/m   Opioid Risk Score:   Fall Risk Score:  `1  Depression screen PHQ 2/9  Depression screen [facility] 2/9 03/05/2021 01/09/2021 11/12/2020 10/15/2020 09/11/2020 05/14/2020 01/18/2020  Decreased Interest 0 1 0 1 1 0 1  Down, Depressed, Hopeless 0 1 0 1 1 0 1  PHQ - 2 Score 0 2 0 2 2 0 2  Altered sleeping - - - - - - -  Tired, decreased energy - - - - - - -  Change in appetite - - - - - - -  Feeling bad or failure about yourself  - - - - - - -  Trouble concentrating - - - - - - -  Moving slowly or fidgety/restless - - - - - - -  Suicidal thoughts - - - - - - -  PHQ-9 Score - - - - - - -  Difficult doing work/chores - - - - - - -  Some recent data might be hidden     Review of Systems  Musculoskeletal:  Positive for back pain.       Leg pain  All other systems reviewed and are negative.     Objective:   Physical Exam Vitals and nursing note reviewed.  Constitutional:      Appearance: Normal appearance.  Cardiovascular:     Rate and Rhythm: Normal rate and regular rhythm.     Pulses: Normal pulses.     Heart sounds: Normal heart sounds.  Pulmonary:     Effort: Pulmonary effort is normal.     Breath sounds: Normal breath sounds.  Musculoskeletal:     Cervical back: Normal range of motion and neck supple.     Comments: Normal Muscle Bulk and Muscle Testing Reveals:  Upper Extremities: Full ROM and Muscle Strength  5/5  Lumbar Paraspinal Tenderness: L-4-L-5 Lower Extremities: Full ROM and Muscle Strength 5/5 Arises from Table with ease Narrow Based  Gait     Skin:    General: Skin is  warm and dry.  Neurological:      Mental Status: She is alert and oriented to person, place, and time.  Psychiatric:        Mood and Affect: Mood normal.        Behavior: Behavior normal.         Assessment & Plan:  1. History of chronic lumbar facet disease with spondylosis/DDD/ L4-5 spondylolisthesis.With L4-5 L5-S1 decompression stabilization: 04/02/2021  Refilled: Hydrocodone 10/325mg  one tablet every 6 hours as needed #120.  We will continue the opioid monitoring program, this consists of regular clinic visits, examinations, urine drug screen, pill counts as well as use of [place] Controlled Substance Reporting system. A 12 month History has been reviewed on the [place] on 04/02/2021. 2. Depression with anxiety/ PTSD:  Dr. Sima Matas Following.Continue Xanax,Abilify and Effexor. 04/02/2021 3. Cervical spondylosis: Stable at this time with no complaints. Continue Medication Regime. 04/07/2021 4. 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 Ritalin: Refilled: Ritalin10 mg  One tablet at Breakfast and Lunch. # 1. Continue with  slow weaning.  04/02/2021. 5. Muscle Spasm: Continue current medication regimen with  Flexeril: May take 1-2 tablets at HS. 04/02/2021 6. Left Lumbar Radiculitis:  Continue HEP as Tolerated . Continue to Monitor. 04/02/2021. 7. Left Knee Osteoarthritis: No complaints today. S/P Left Knee Injection on 10/26/2018 with good relief noted.04/02/2021 8. Right Knee Pain: No complaints today. Continue to alternate with ice and heat therapy. Continue to monitor.  04/02/2021     F/U in 1 month

## 2021-04-07 ENCOUNTER — Encounter: Payer: Self-pay | Admitting: Registered Nurse

## 2021-04-09 ENCOUNTER — Telehealth: Payer: Self-pay

## 2021-04-09 DIAGNOSIS — M47817 Spondylosis without myelopathy or radiculopathy, lumbosacral region: Secondary | ICD-10-CM

## 2021-04-09 MED ORDER — HYDROCODONE-ACETAMINOPHEN 10-325 MG PO TABS: 1.0000 | ORAL_TABLET | Freq: Four times a day (QID) | ORAL | 0 refills | Status: DC | PRN

## 2021-04-09 NOTE — Telephone Encounter (Signed)
I called [facility] and they do have Hydrocodone/APAP 10/325 mg #120. Can you send a prescription to [facility]. [facility] called and prescription cancelled.

## 2021-04-09 NOTE — Telephone Encounter (Signed)
[facility] called stating [facility] does not have her [facility] in stock but [facility] states [facility] does. I called patient back and advised her to call [facility] to make sure they have it in stock and call me back to let me know where [name] needs to send it to. Will cancel at [facility].

## 2021-04-09 NOTE — Telephone Encounter (Signed)
PMP Was Reviewed: Hydrocodone e-scribed today, Placed a call to Ms. Weilbacher, she verbalizes understanding.

## 2021-04-25 ENCOUNTER — Encounter: Payer: BC Managed Care – PPO | Admitting: Family Medicine

## 2021-04-30 ENCOUNTER — Other Ambulatory Visit: Payer: Self-pay

## 2021-04-30 ENCOUNTER — Encounter: Payer: Self-pay | Admitting: Registered Nurse

## 2021-04-30 ENCOUNTER — Encounter: Payer: BC Managed Care – PPO | Attending: Physical Medicine and Rehabilitation | Admitting: Registered Nurse

## 2021-04-30 VITALS — BP 135/85 | HR 61 | Temp 98.5°F | Ht 61.0 in | Wt 169.0 lb

## 2021-04-30 DIAGNOSIS — Z5181 Encounter for therapeutic drug level monitoring: Secondary | ICD-10-CM | POA: Insufficient documentation

## 2021-04-30 DIAGNOSIS — M797 Fibromyalgia: Secondary | ICD-10-CM | POA: Diagnosis not present

## 2021-04-30 DIAGNOSIS — M5416 Radiculopathy, lumbar region: Secondary | ICD-10-CM

## 2021-04-30 DIAGNOSIS — F0781 Postconcussional syndrome: Secondary | ICD-10-CM | POA: Diagnosis not present

## 2021-04-30 DIAGNOSIS — M47816 Spondylosis without myelopathy or radiculopathy, lumbar region: Secondary | ICD-10-CM | POA: Diagnosis not present

## 2021-04-30 DIAGNOSIS — G894 Chronic pain syndrome: Secondary | ICD-10-CM | POA: Diagnosis not present

## 2021-04-30 DIAGNOSIS — Z79891 Long term (current) use of opiate analgesic: Secondary | ICD-10-CM | POA: Insufficient documentation

## 2021-04-30 DIAGNOSIS — M47817 Spondylosis without myelopathy or radiculopathy, lumbosacral region: Secondary | ICD-10-CM | POA: Diagnosis not present

## 2021-04-30 DIAGNOSIS — M961 Postlaminectomy syndrome, not elsewhere classified: Secondary | ICD-10-CM | POA: Diagnosis not present

## 2021-04-30 MED ORDER — HYDROCODONE-ACETAMINOPHEN 10-325 MG PO TABS: 1.0000 | ORAL_TABLET | Freq: Four times a day (QID) | ORAL | 0 refills | Status: DC | PRN

## 2021-04-30 MED ORDER — METHYLPHENIDATE HCL 10 MG PO TABS: 10.0000 mg | ORAL_TABLET | Freq: Two times a day (BID) | ORAL | 0 refills | Status: DC

## 2021-04-30 MED ORDER — ALPRAZOLAM 1 MG PO TABS: ORAL_TABLET | ORAL | 3 refills | Status: DC

## 2021-04-30 NOTE — Progress Notes (Signed)
Subjective:    Patient ID: [name], female    DOB: 03-Jan-1956, 65 y.o.   MRN: [id number]  HPI: [name] is a 65 y.o. female who returns for follow up appointment for chronic pain and medication refill. She states her pain is located in  in her lower back. She rates her pain 9. Her current exercise regime is walking and performing stretching exercises.  Ms. Djordjevic expressing feeling overwhelmed with cleaning out her mother- in law condo, we discussed the above in detail. She states she will ask for some assistance from her husband and she has an appointment with Dr Sima Matas on 05/05/2021, to discuss some more strategies,she reports, emotional support given.   Ms. Zeis Morphine equivalent is 40.00 MME.She is also prescribed Alprazolam we have discussed the black box warning of using opioids and benzodiazepines. I highlighted the dangers of using these drugs together and discussed the adverse events including respiratory suppression, overdose, cognitive impairment and importance of compliance with current regimen. We will continue to monitor and adjust as indicated.  she is being closely monitored and under the care of her psychologist.   Last UDS was Performed on 03/05/2021, it was consistent.     Pain Inventory Average Pain 10 Pain Right Now 9 My pain is sharp, burning, dull, stabbing, tingling, and aching  In the last 24 hours, has pain interfered with the following? General activity 10 Relation with others 10 Enjoyment of life 10 What TIME of day is your pain at its worst? night Sleep (in general) Poor  Pain is worse with: walking, bending, sitting, inactivity, standing, and some activites Pain improves with: rest, heat/ice, pacing activities, medication, and injections Relief from Meds: 9  Family History  Problem Relation Age of Onset   Cervical cancer Mother    Diabetes Mother    Stroke Mother    Alzheimer's disease Mother    Heart attack Father     Stroke Father    Colon cancer Father    Anxiety disorder Maternal Aunt    Depression Maternal Aunt    Anxiety disorder Maternal Uncle    Depression Maternal Uncle        suicide attempt by gun shot wound to head. Survived   Alcohol abuse Other    Drug abuse Other    Breast cancer Maternal Grandmother    Diabetes Maternal Grandmother    Breast cancer Paternal Aunt    Colon polyps Sister        x 2   Social History   Socioeconomic History   Marital status: Married    Spouse name: Not on file   Number of children: 0   Years of education: Not on file   Highest education level: Not on file  Occupational History   Occupation: disabled  Tobacco Use   Smoking status: Never   Smokeless tobacco: Never  Vaping Use   Vaping Use: Never used  Substance and Sexual Activity   Alcohol use: No    Alcohol/week: 0.0 standard drinks   Drug use: No   Sexual activity: Yes  Other Topics Concern   Not on file  Social History Narrative   Pt has h.s. degree   Social Determinants of Health   Financial Resource Strain: Not on file  Food Insecurity: Not on file  Transportation Needs: Not on file  Physical Activity: Not on file  Stress: Not on file  Social Connections: Not on file   Past Surgical History:  Procedure Laterality Date  ABDOMINAL HYSTERECTOMY  1999   bunions removed  1988   C4 C5 cage insertion  06/28/2013   CHOLECYSTECTOMY  04/25/2008   EDG  12/17/2004   ELECTROCARDIOGRAM  05/27/2007   L5 S1 rod insertion  11/07/2012   SKIN CANCER EXCISION     SKIN CANCER EXCISION     several   TONSILLECTOMY  1981   TUBAL LIGATION  1997   Past Surgical History:  Procedure Laterality Date   ABDOMINAL HYSTERECTOMY  1999   bunions removed  1988   C4 C5 cage insertion  06/28/2013   CHOLECYSTECTOMY  04/25/2008   EDG  12/17/2004   ELECTROCARDIOGRAM  05/27/2007   L5 S1 rod insertion  11/07/2012   SKIN CANCER EXCISION     SKIN CANCER EXCISION     several   TONSILLECTOMY  1981   TUBAL LIGATION   1997   Past Medical History:  Diagnosis Date   Anemia    ANXIETY 06/10/2007   Cataract    Chronic headaches    Degenerative arthritis of spine    back and neck   Depression    DYSLIPIDEMIA 06/28/2008   Esophageal stricture    Fibromyalgia    Gallstones    GERD 06/10/2007   Hiatal hernia    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   HYPERTENSION 06/10/2007   Irritable bowel syndrome 06/28/2008   OSTEOARTHRITIS 06/10/2007   Skin cancer    basal cell   Tubular adenoma of colon    BP 135/85 (BP Location: Right Arm)   Pulse 61   Temp 98.5 F (36.9 C) (Oral)   Ht 5\' 1"  (1.549 m)   Wt 169 lb (76.7 kg)   SpO2 98%   BMI 31.93 kg/m   Opioid Risk Score:   Fall Risk Score:  `1  Depression screen PHQ 2/9  Depression screen [facility] 2/9 03/05/2021 01/09/2021 11/12/2020 10/15/2020 09/11/2020 05/14/2020 01/18/2020  Decreased Interest 0 1 0 1 1 0 1  Down, Depressed, Hopeless 0 1 0 1 1 0 1  PHQ - 2 Score 0 2 0 2 2 0 2  Altered sleeping - - - - - - -  Tired, decreased energy - - - - - - -  Change in appetite - - - - - - -  Feeling bad or failure about yourself  - - - - - - -  Trouble concentrating - - - - - - -  Moving slowly or fidgety/restless - - - - - - -  Suicidal thoughts - - - - - - -  PHQ-9 Score - - - - - - -  Difficult doing work/chores - - - - - - -  Some recent data might be hidden      Review of Systems  Constitutional: Negative.   HENT: Negative.    Respiratory: Negative.    Cardiovascular: Negative.   Gastrointestinal:  Positive for abdominal pain.  Endocrine: Negative.   Genitourinary: Negative.   Musculoskeletal:  Positive for back pain, gait problem and neck pain.       Pain in left leg , pain in left foot, pain in left hip  Skin: Negative.   Allergic/Immunologic: Negative.   Psychiatric/Behavioral: Negative.        Objective:   Physical Exam Vitals and nursing note reviewed.  Constitutional:      Appearance: Normal appearance.  Cardiovascular:     Rate and Rhythm: Normal rate and  regular rhythm.     Pulses: Normal pulses.     Heart  sounds: Normal heart sounds.  Pulmonary:     Effort: Pulmonary effort is normal.     Breath sounds: Normal breath sounds.  Musculoskeletal:     Cervical back: Normal range of motion and neck supple.     Comments: Normal Muscle Bulk and Muscle Testing Reveals:  Upper Extremities: Full ROM and Muscle Strength 5/5 Lumbar Paraspinal Tenderness: L-3-L-5 Lower Extremities: Full ROM and Muscle Strength 5/5 Arises from Chair with ease Narrow Based  Gait     Skin:    General: Skin is warm and dry.  Neurological:     Mental Status: She is alert and oriented to person, place, and time.  Psychiatric:        Mood and Affect: Mood normal.        Behavior: Behavior normal.         Assessment & Plan:  1. History of chronic lumbar facet disease with spondylosis/DDD/ L4-5 spondylolisthesis.With L4-5 L5-S1 decompression stabilization: 04/30/2021  Refilled: Hydrocodone 10/325mg  one tablet every 6 hours as needed #120.  We will continue the opioid monitoring program, this consists of regular clinic visits, examinations, urine drug screen, pill counts as well as use of [place] Controlled Substance Reporting system. A 12 month History has been reviewed on the [place] on 04/30/2021. 2. Depression with anxiety/ PTSD:  Dr. Sima Matas Following.Continue Xanax,Abilify and Effexor. 04/30/2021 3. Cervical spondylosis: Stable at this time with no complaints. Continue Medication Regime. 04/30/2021 4. 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 Ritalin: Refilled: Ritalin10 mg  One tablet at Breakfast and Lunch. # 21. Continue with  slow weaning.  04/30/2021. 5. Muscle Spasm: Continue current medication regimen with  Flexeril: May take 1-2 tablets at HS. 04/30/2021 6. Left Lumbar Radiculitis:  Continue HEP as Tolerated . Continue to Monitor. 04/30/2021. 7. Left Knee  Osteoarthritis: No complaints today. S/P Left Knee Injection on 10/26/2018 with good relief noted.04/30/2021 8. Right Knee Pain: No complaints today. Continue to alternate with ice and heat therapy. Continue to monitor.  04/30/2021     F/U in 1 month

## 2021-05-05 ENCOUNTER — Other Ambulatory Visit: Payer: Self-pay

## 2021-05-05 ENCOUNTER — Encounter: Payer: Self-pay | Admitting: Psychology

## 2021-05-05 ENCOUNTER — Encounter: Payer: BC Managed Care – PPO | Admitting: Psychology

## 2021-05-05 DIAGNOSIS — M961 Postlaminectomy syndrome, not elsewhere classified: Secondary | ICD-10-CM | POA: Diagnosis not present

## 2021-05-05 DIAGNOSIS — Z79891 Long term (current) use of opiate analgesic: Secondary | ICD-10-CM | POA: Diagnosis not present

## 2021-05-05 DIAGNOSIS — F0781 Postconcussional syndrome: Secondary | ICD-10-CM | POA: Diagnosis not present

## 2021-05-05 DIAGNOSIS — M47817 Spondylosis without myelopathy or radiculopathy, lumbosacral region: Secondary | ICD-10-CM | POA: Diagnosis not present

## 2021-05-05 DIAGNOSIS — M5416 Radiculopathy, lumbar region: Secondary | ICD-10-CM | POA: Diagnosis not present

## 2021-05-05 DIAGNOSIS — Z5181 Encounter for therapeutic drug level monitoring: Secondary | ICD-10-CM | POA: Diagnosis not present

## 2021-05-05 DIAGNOSIS — M797 Fibromyalgia: Secondary | ICD-10-CM | POA: Diagnosis not present

## 2021-05-05 DIAGNOSIS — G894 Chronic pain syndrome: Secondary | ICD-10-CM

## 2021-05-05 DIAGNOSIS — M47816 Spondylosis without myelopathy or radiculopathy, lumbar region: Secondary | ICD-10-CM | POA: Diagnosis not present

## 2021-05-05 NOTE — Progress Notes (Signed)
Patient:                           [name]            DOB:                               [birth year]   MR Number:                  [id number]   Location:                        [facility] [address] [place] [id number] [place] [id number] Dept: [phone]   Start:     2 PM End:                                 3 PM  Today's visit was an in person visit was conducted in my outpatient clinic office with the patient myself present.   Provider/Observer:                           Edgardo Roys PSYD   Chief Complaint:                                   Chief Complaint  Patient presents with   Post-Traumatic Stress Disorder   Pain   Depression   Memory Loss   Anxiety      Reason For Service:                          [name] is a 65 year old female referred by Dr. Naaman Plummer due to issues of coping and dealing with a number of stressors. She has a history of postconcussion syndrome, chronic pain syndrome as well as PTSD.  The patient has been dealing with significant anxiety and depression along with a history of PTSD and postconcussion syndrome. The patient was referred for psychotherapeutic interventions primarily because of the issues of depression and coping with family stress.  The above reason for service was reviewed and remains accurate.  The patient reports that there has been significant improvements in psychosocial issues but there continues to be a lot of stress particularly around issues related to her mother's dementia and the mother not recognizing the patient.  The above reason for service was reviewed and remains applicable for the current visit.  The patient has been having more depressive symptoms with increased psychosocial stressors including her mother at end stages of Alzheimer's Dementia.  The mother is living with patient's sister and while the mother  likely needs a SNF the sister insists on taking care of the mother and allowing the mother to be at home when she dies.  However, this results in patient having to help the sister in significant ways including cleaning the sister's house and caring for mother that has significant medical and neurological needs.    The above reason for service has been reviewed and remains applicable for the current visit.  I have not seen the patient since May of this year.  The patient's mother has  passed away since I saw her and things went relatively smoothly after the death of her mother as the older sister took over the finalization of the will and a state.  The patient continues to have conflicts with her other sister as the other sister is described as having traits consistent with significant narcissistic personality disorder.  The patient reports that her pain has been better managed but it continues to limit her physically.  There continue to be some psychosocial stressors that are problematic for but overall she is coping well and continuing to improve and manage.  Interventions Strategy:                    Cognitive/behavioral psychotherapeutic interventions  Participation Level:                           The patient was appropriate and active during current visit with good mental status.    Participation Quality:                       Appropriate and attentive                          Behavioral Observation:                  Patient was well-groomed, alert and appropriate in her interactions throughout the visit.   Current Psychosocial Factors:        The patient reports that she has been able to distance herself from her sister for the most part and picks and chooses the times that the interact.  This helps her with coping for the most part around the stressors that her sister with significant narcissistic like descriptions (as described by the patient).   Content of Session:                            Reviewed current symptoms and continued to work on issues of depression, pain, PTSD and anxiety.  Reviewed current symptoms and continue to work on therapeutic issues around pain, PTSD, and depression/anxiety.  Current Status:                                   T the patient reports that her coping skills and strategies have continued to improve and that she is doing much better as far as her interactions.  She reports that her anxiety and depressive symptoms have been improving.  Patient Progress:                               The patient continues to show improvement in her overall functioning.  Impression/Diagnosis:                     [name] is a 65 year old female referred by Dr. Naaman Plummer due to issues of coping and dealing with a number of stressors. She has a history of postconcussion syndrome, chronic pain syndrome as well as PTSD.  The patient describes significant difficulty with issues related to her family and the family estate. There were issues when her father passed away where an older sister was in charge of everything and all of his possessions were been ago to this older  sister. However, the middle sister began having trouble and when the patient could not help the sister rejected the patient. There've been a number of family conflicts going on including a niece who got in trouble with doing drugs and her excuse to the patient's sister was that the patient was the one who started her doing drugs and giving her money. This is not accurate.  T the patient reports that she managed the death of her mother fairly well.  She continues to have significant pain symptoms, PTSD symptoms and postconcussive symptoms but has been managing better and actively working on therapeutic interventions we have addressed.  Diagnosis:   Lumbar post-laminectomy syndrome  Post concussion syndrome  Chronic pain syndrome

## 2021-05-19 ENCOUNTER — Other Ambulatory Visit: Payer: Self-pay | Admitting: Family Medicine

## 2021-05-21 IMAGING — DX DG CHEST 1V PORT
1 series · 1 of 1 positions shown · non-contrast
Comparison: 10/24/2020

CLINICAL DATA: Short of breath

EXAM:
PORTABLE CHEST 1 VIEW

[chest ap]
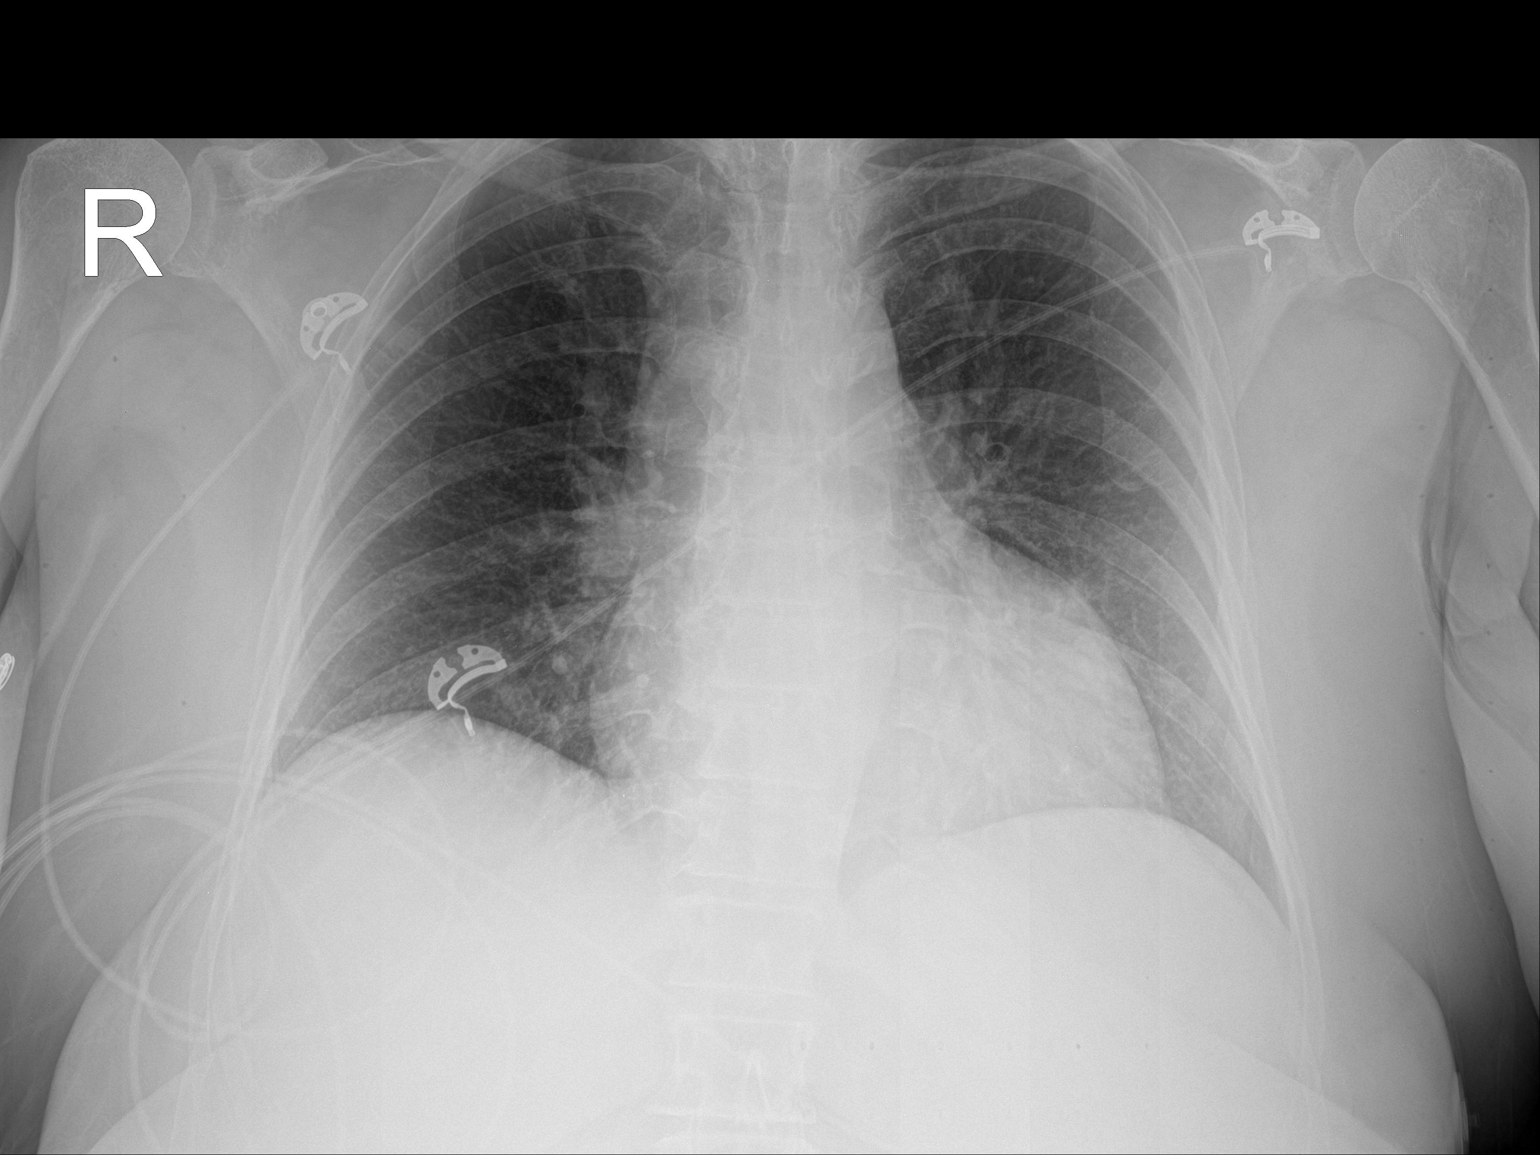

[1 of 1 positions shown; findings below may reference images not displayed]

FINDINGS: The heart size and mediastinal contours are within normal limits.
Both lungs are clear. The visualized skeletal structures are
unremarkable.
IMPRESSION: No active disease.

## 2021-06-04 ENCOUNTER — Encounter: Payer: Self-pay | Admitting: Registered Nurse

## 2021-06-04 ENCOUNTER — Other Ambulatory Visit: Payer: Self-pay

## 2021-06-04 ENCOUNTER — Encounter: Payer: BC Managed Care – PPO | Attending: Physical Medicine and Rehabilitation | Admitting: Registered Nurse

## 2021-06-04 VITALS — BP 140/77 | HR 68 | Temp 98.0°F | Ht 61.0 in | Wt 170.4 lb

## 2021-06-04 DIAGNOSIS — M797 Fibromyalgia: Secondary | ICD-10-CM | POA: Diagnosis not present

## 2021-06-04 DIAGNOSIS — M47816 Spondylosis without myelopathy or radiculopathy, lumbar region: Secondary | ICD-10-CM

## 2021-06-04 DIAGNOSIS — G894 Chronic pain syndrome: Secondary | ICD-10-CM

## 2021-06-04 DIAGNOSIS — M47817 Spondylosis without myelopathy or radiculopathy, lumbosacral region: Secondary | ICD-10-CM | POA: Insufficient documentation

## 2021-06-04 DIAGNOSIS — M5416 Radiculopathy, lumbar region: Secondary | ICD-10-CM | POA: Diagnosis not present

## 2021-06-04 DIAGNOSIS — Z5181 Encounter for therapeutic drug level monitoring: Secondary | ICD-10-CM | POA: Insufficient documentation

## 2021-06-04 DIAGNOSIS — G8929 Other chronic pain: Secondary | ICD-10-CM | POA: Insufficient documentation

## 2021-06-04 DIAGNOSIS — M545 Low back pain, unspecified: Secondary | ICD-10-CM

## 2021-06-04 DIAGNOSIS — M961 Postlaminectomy syndrome, not elsewhere classified: Secondary | ICD-10-CM | POA: Diagnosis not present

## 2021-06-04 DIAGNOSIS — F0781 Postconcussional syndrome: Secondary | ICD-10-CM | POA: Diagnosis not present

## 2021-06-04 DIAGNOSIS — Z79891 Long term (current) use of opiate analgesic: Secondary | ICD-10-CM

## 2021-06-04 MED ORDER — HYDROCODONE-ACETAMINOPHEN 10-325 MG PO TABS: 1.0000 | ORAL_TABLET | Freq: Four times a day (QID) | ORAL | 0 refills | Status: DC | PRN

## 2021-06-04 MED ORDER — METHYLPHENIDATE HCL 10 MG PO TABS: 10.0000 mg | ORAL_TABLET | Freq: Two times a day (BID) | ORAL | 0 refills | Status: DC

## 2021-06-04 NOTE — Progress Notes (Signed)
Subjective:    Patient ID: [name], female    DOB: [birth year], 65 y.o.   MRN: [id number]  HPI: [name] is a 65 y.o. female who returns for follow up appointment for chronic pain and medication refill. She states her pain is located in her lower back radiating into her left buttock and left lower extremity. [name] reports increase intensity of lower back pain, she called her neurosurgeon. She needed a new referral, referral sent today, she verbalizes understanding. She rates her pain 10. Her current exercise regime is walking.  [name] Morphine equivalent is 40.00  MME.She is also prescribed Alprazolam. We have discussed the black box warning of using opioids and benzodiazepines. I highlighted the dangers of using these drugs together and discussed the adverse events including respiratory suppression, overdose, cognitive impairment and importance of compliance with current regimen. We will continue to monitor and adjust as indicated.      Last  UDS was performed on 03/09/2021, it was consistent.    Pain Inventory Average Pain 10 Pain Right Now 10 My pain is constant, sharp, burning, stabbing, tingling, and aching  In the last 24 hours, has pain interfered with the following? General activity 10 Relation with others 10 Enjoyment of life 10 What TIME of day is your pain at its worst? morning , daytime, evening, and night Sleep (in general) Poor  Pain is worse with: walking, bending, sitting, standing, and some activites Pain improves with: medication and prayer Relief from Meds: 3  Family History  Problem Relation Age of Onset   Cervical cancer Mother    Diabetes Mother    Stroke Mother    Alzheimer's disease Mother    Heart attack Father    Stroke Father    Colon cancer Father    Anxiety disorder Maternal Aunt    Depression Maternal Aunt    Anxiety disorder Maternal Uncle    Depression Maternal Uncle        suicide attempt by gun shot wound to  head. Survived   Alcohol abuse Other    Drug abuse Other    Breast cancer Maternal Grandmother    Diabetes Maternal Grandmother    Breast cancer Paternal Aunt    Colon polyps Sister        x 2   Social History   Socioeconomic History   Marital status: Married    Spouse name: Not on file   Number of children: 0   Years of education: Not on file   Highest education level: Not on file  Occupational History   Occupation: disabled  Tobacco Use   Smoking status: Never   Smokeless tobacco: Never  Vaping Use   Vaping Use: Never used  Substance and Sexual Activity   Alcohol use: No    Alcohol/week: 0.0 standard drinks   Drug use: No   Sexual activity: Yes  Other Topics Concern   Not on file  Social History Narrative   Pt has h.s. degree   Social Determinants of Health   Financial Resource Strain: Not on file  Food Insecurity: Not on file  Transportation Needs: Not on file  Physical Activity: Not on file  Stress: Not on file  Social Connections: Not on file   Past Surgical History:  Procedure Laterality Date   ABDOMINAL HYSTERECTOMY  1999   bunions removed  1988   C4 C5 cage insertion  06/28/2013   CHOLECYSTECTOMY  04/25/2008   EDG  12/17/2004   ELECTROCARDIOGRAM  05/27/2007   L5 S1 rod insertion  11/07/2012   SKIN CANCER EXCISION     SKIN CANCER EXCISION     several   TONSILLECTOMY  1981   TUBAL LIGATION  1997   Past Surgical History:  Procedure Laterality Date   ABDOMINAL HYSTERECTOMY  1999   bunions removed  1988   C4 C5 cage insertion  06/28/2013   CHOLECYSTECTOMY  04/25/2008   EDG  12/17/2004   ELECTROCARDIOGRAM  05/27/2007   L5 S1 rod insertion  11/07/2012   SKIN CANCER EXCISION     SKIN CANCER EXCISION     several   TONSILLECTOMY  1981   TUBAL LIGATION  1997   Past Medical History:  Diagnosis Date   Anemia    ANXIETY 06/10/2007   Cataract    Chronic headaches    Degenerative arthritis of spine    back and neck   Depression    DYSLIPIDEMIA 06/28/2008    Esophageal stricture    Fibromyalgia    Gallstones    GERD 06/10/2007   Hiatal hernia    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   HYPERTENSION 06/10/2007   Irritable bowel syndrome 06/28/2008   OSTEOARTHRITIS 06/10/2007   Skin cancer    basal cell   Tubular adenoma of colon    BP 140/77   Pulse 68   Temp 98 F (36.7 C)   Ht '5\' 1"'$  (1.549 m)   Wt 170 lb 6.4 oz (77.3 kg)   SpO2 95%   BMI 32.20 kg/m   Opioid Risk Score:   Fall Risk Score:  `1  Depression screen PHQ 2/9  Depression screen [facility] 2/9 04/30/2021 03/05/2021 01/09/2021 11/12/2020 10/15/2020 09/11/2020 05/14/2020  Decreased Interest 1 0 1 0 1 1 0  Down, Depressed, Hopeless 1 0 1 0 1 1 0  PHQ - 2 Score 2 0 2 0 2 2 0  Altered sleeping - - - - - - -  Tired, decreased energy - - - - - - -  Change in appetite - - - - - - -  Feeling bad or failure about yourself  - - - - - - -  Trouble concentrating - - - - - - -  Moving slowly or fidgety/restless - - - - - - -  Suicidal thoughts - - - - - - -  PHQ-9 Score - - - - - - -  Difficult doing work/chores - - - - - - -  Some recent data might be hidden    Review of Systems  Musculoskeletal:  Positive for back pain and neck pain.       Left leg pain, left foot pain & right foot pain & pain in both hips  All other systems reviewed and are negative.     Objective:   Physical Exam Vitals and nursing note reviewed.  Constitutional:      Appearance: Normal appearance.  Cardiovascular:     Rate and Rhythm: Normal rate and regular rhythm.     Pulses: Normal pulses.     Heart sounds: Normal heart sounds.  Pulmonary:     Effort: Pulmonary effort is normal.     Breath sounds: Normal breath sounds.  Musculoskeletal:     Cervical back: Normal range of motion and neck supple.     Comments: Normal Muscle Bulk and Muscle Testing Reveals:  Upper Extremities: Full ROM and Muscle Strength 5/5 Thoracic Paraspinal Tenderness: T-1-T-2 Lumbar Hypersensitivity: L-3-L-5 Left Greater Trochanter Tenderness Lower  Extremities: Full ROM and  Muscle Strength 5/5 Arises from Table Slowly Antalgic  Gait     Skin:    General: Skin is warm and dry.  Neurological:     Mental Status: She is alert and oriented to person, place, and time.  Psychiatric:        Mood and Affect: Mood normal.        Behavior: Behavior normal.         Assessment & Plan:  1. History of chronic lumbar facet disease with spondylosis/DDD/ L4-5 spondylolisthesis.With L4-5 L5-S1 decompression stabilization: 06/04/2021  Refilled: Hydrocodone 10/'325mg'$  one tablet every 6 hours as needed #120.  We will continue the opioid monitoring program, this consists of regular clinic visits, examinations, urine drug screen, pill counts as well as use of [place] Controlled Substance Reporting system. A 12 month History has been reviewed on the [place] on 06/04/2021. 2. Depression with anxiety/ PTSD:  Dr. Sima Matas Following.Continue Xanax,Abilify and Effexor. 06/04/2021 3. Cervical spondylosis: Stable at this time with no complaints. Continue Medication Regime. 06/04/2021 4. 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 Ritalin: Refilled: Ritalin10 mg  One tablet at Breakfast and Lunch. # 59. Continue with  slow weaning.  06/04/2021. 5. Muscle Spasm: Continue current medication regimen with  Flexeril: May take 1-2 tablets at HS. 06/04/2021 6. Left Lumbar Radiculitis:  Continue HEP as Tolerated . Continue to Monitor. 06/04/2021. 7. Left Knee Osteoarthritis: No complaints today. S/P Left Knee Injection on 10/26/2018 with good relief noted.06/04/2021 8. Right Knee Pain: No complaints today. Continue to alternate with ice and heat therapy. Continue to monitor.  06/04/2021     F/U in 1 month

## 2021-06-05 ENCOUNTER — Other Ambulatory Visit: Payer: Self-pay | Admitting: Registered Nurse

## 2021-06-24 DIAGNOSIS — Z6832 Body mass index (BMI) 32.0-32.9, adult: Secondary | ICD-10-CM | POA: Diagnosis not present

## 2021-06-24 DIAGNOSIS — M5416 Radiculopathy, lumbar region: Secondary | ICD-10-CM | POA: Diagnosis not present

## 2021-06-24 DIAGNOSIS — I10 Essential (primary) hypertension: Secondary | ICD-10-CM | POA: Diagnosis not present

## 2021-06-27 DIAGNOSIS — H04123 Dry eye syndrome of bilateral lacrimal glands: Secondary | ICD-10-CM | POA: Diagnosis not present

## 2021-06-27 DIAGNOSIS — H35373 Puckering of macula, bilateral: Secondary | ICD-10-CM | POA: Diagnosis not present

## 2021-06-27 DIAGNOSIS — H43393 Other vitreous opacities, bilateral: Secondary | ICD-10-CM | POA: Diagnosis not present

## 2021-06-27 DIAGNOSIS — D23112 Other benign neoplasm of skin of right lower eyelid, including canthus: Secondary | ICD-10-CM | POA: Diagnosis not present

## 2021-07-01 DIAGNOSIS — M545 Low back pain, unspecified: Secondary | ICD-10-CM | POA: Diagnosis not present

## 2021-07-01 DIAGNOSIS — M5416 Radiculopathy, lumbar region: Secondary | ICD-10-CM | POA: Diagnosis not present

## 2021-07-07 ENCOUNTER — Ambulatory Visit: Payer: BC Managed Care – PPO | Admitting: Psychology

## 2021-07-08 DIAGNOSIS — M5416 Radiculopathy, lumbar region: Secondary | ICD-10-CM | POA: Diagnosis not present

## 2021-07-10 ENCOUNTER — Encounter: Payer: BC Managed Care – PPO | Attending: Physical Medicine and Rehabilitation | Admitting: Registered Nurse

## 2021-07-10 ENCOUNTER — Other Ambulatory Visit: Payer: Self-pay

## 2021-07-10 VITALS — BP 139/86 | HR 90 | Temp 98.9°F | Ht 61.0 in | Wt 173.9 lb

## 2021-07-10 DIAGNOSIS — M7061 Trochanteric bursitis, right hip: Secondary | ICD-10-CM

## 2021-07-10 DIAGNOSIS — G894 Chronic pain syndrome: Secondary | ICD-10-CM

## 2021-07-10 DIAGNOSIS — M797 Fibromyalgia: Secondary | ICD-10-CM

## 2021-07-10 DIAGNOSIS — M5416 Radiculopathy, lumbar region: Secondary | ICD-10-CM

## 2021-07-10 DIAGNOSIS — Z5181 Encounter for therapeutic drug level monitoring: Secondary | ICD-10-CM

## 2021-07-10 DIAGNOSIS — M7062 Trochanteric bursitis, left hip: Secondary | ICD-10-CM | POA: Diagnosis not present

## 2021-07-10 DIAGNOSIS — M961 Postlaminectomy syndrome, not elsewhere classified: Secondary | ICD-10-CM | POA: Diagnosis not present

## 2021-07-10 DIAGNOSIS — M47817 Spondylosis without myelopathy or radiculopathy, lumbosacral region: Secondary | ICD-10-CM | POA: Diagnosis not present

## 2021-07-10 DIAGNOSIS — M47816 Spondylosis without myelopathy or radiculopathy, lumbar region: Secondary | ICD-10-CM | POA: Diagnosis not present

## 2021-07-10 DIAGNOSIS — Z79891 Long term (current) use of opiate analgesic: Secondary | ICD-10-CM | POA: Diagnosis not present

## 2021-07-10 DIAGNOSIS — F0781 Postconcussional syndrome: Secondary | ICD-10-CM | POA: Diagnosis not present

## 2021-07-10 NOTE — Patient Instructions (Signed)
May Increase your Hydrocodone to 5 times a day as needed for pain . Please send a My-Chart message in two weeks to evaluate medication change.

## 2021-07-10 NOTE — Progress Notes (Signed)
Subjective:    Patient ID: [name], female    DOB: 1956/09/21, 65 y.o.   MRN: [id number]  HPI: [name] is a 65 y.o. female who returns for follow up appointment for chronic pain and medication refill. She states her pain is located in her mid-lower back radiating into her left lower extremity and  bilateral hip pain R>L. She also reports three weeks ago  she was walking down her stairs when  she lost her footing and fell down the stairs and hit her back against the stairs. Her husband helped her up. She was seen by Dr Ronnald Ramp on Tuesday 07/08/2021, she states she had a MRI and will be scheduled for surgery soon. We will continue to monitor.  She rates her pain 10.Her current exercise regime is walking short distances with her walker. She states she is in the process of being scheduled for physical therapy, awaiting a date.   [name] Morphine equivalent is 40.00 MME. She is also prescribed Alprazolam. We have discussed the black box warning of using opioids and benzodiazepines. I highlighted the dangers of using these drugs together and discussed the adverse events including respiratory suppression, overdose, cognitive impairment and importance of compliance with current regimen. We will continue to monitor and adjust as indicated.    Last UDS was Performed on 03/05/2021, it was consistent.     Pain Inventory Average Pain 10 Pain Right Now 10 My pain is sharp, burning, stabbing, and aching  In the last 24 hours, has pain interfered with the following? General activity 10 Relation with others 10 Enjoyment of life 10 What TIME of day is your pain at its worst? night Sleep (in general) Poor  Pain is worse with: walking, bending, sitting, inactivity, standing, and some activites Pain improves with: medication Relief from Meds: 9  Family History  Problem Relation Age of Onset   Cervical cancer Mother    Diabetes Mother    Stroke Mother    Alzheimer's disease Mother     Heart attack Father    Stroke Father    Colon cancer Father    Anxiety disorder Maternal Aunt    Depression Maternal Aunt    Anxiety disorder Maternal Uncle    Depression Maternal Uncle        suicide attempt by gun shot wound to head. Survived   Alcohol abuse Other    Drug abuse Other    Breast cancer Maternal Grandmother    Diabetes Maternal Grandmother    Breast cancer Paternal Aunt    Colon polyps Sister        x 2   Social History   Socioeconomic History   Marital status: Married    Spouse name: Not on file   Number of children: 0   Years of education: Not on file   Highest education level: Not on file  Occupational History   Occupation: disabled  Tobacco Use   Smoking status: Never   Smokeless tobacco: Never  Vaping Use   Vaping Use: Never used  Substance and Sexual Activity   Alcohol use: No    Alcohol/week: 0.0 standard drinks   Drug use: No   Sexual activity: Yes  Other Topics Concern   Not on file  Social History Narrative   Pt has h.s. degree   Social Determinants of Health   Financial Resource Strain: Not on file  Food Insecurity: Not on file  Transportation Needs: Not on file  Physical Activity: Not on  file  Stress: Not on file  Social Connections: Not on file   Past Surgical History:  Procedure Laterality Date   ABDOMINAL HYSTERECTOMY  1999   bunions removed  1988   C4 C5 cage insertion  06/28/2013   CHOLECYSTECTOMY  04/25/2008   EDG  12/17/2004   ELECTROCARDIOGRAM  05/27/2007   L5 S1 rod insertion  11/07/2012   SKIN CANCER EXCISION     SKIN CANCER EXCISION     several   [facility]   Past Surgical History:  Procedure Laterality Date   ABDOMINAL HYSTERECTOMY  1999   bunions removed  1988   C4 C5 cage insertion  06/28/2013   CHOLECYSTECTOMY  04/25/2008   EDG  12/17/2004   ELECTROCARDIOGRAM  05/27/2007   L5 S1 rod insertion  11/07/2012   SKIN CANCER EXCISION     SKIN CANCER EXCISION     several    TONSILLECTOMY  1981   TUBAL LIGATION  1997   Past Medical History:  Diagnosis Date   Anemia    ANXIETY 06/10/2007   Cataract    Chronic headaches    Degenerative arthritis of spine    back and neck   Depression    DYSLIPIDEMIA 06/28/2008   Esophageal stricture    Fibromyalgia    Gallstones    GERD 06/10/2007   Hiatal hernia    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   HYPERTENSION 06/10/2007   Irritable bowel syndrome 06/28/2008   OSTEOARTHRITIS 06/10/2007   Skin cancer    basal cell   Tubular adenoma of colon    BP 139/86   Pulse 90   Temp 98.9 F (37.2 C) (Oral)   Ht 5\' 1"  (1.549 m)   Wt 173 lb 14.4 oz (78.9 kg)   SpO2 97%   BMI 32.86 kg/m   Opioid Risk Score:   Fall Risk Score:  `1  Depression screen PHQ 2/9  Depression screen [facility] 2/9 06/04/2021 04/30/2021 03/05/2021 01/09/2021 11/12/2020 10/15/2020 09/11/2020  Decreased Interest 1 1 0 1 0 1 1  Down, Depressed, Hopeless 1 1 0 1 0 1 1  PHQ - 2 Score 2 2 0 2 0 2 2  Altered sleeping - - - - - - -  Tired, decreased energy - - - - - - -  Change in appetite - - - - - - -  Feeling bad or failure about yourself  - - - - - - -  Trouble concentrating - - - - - - -  Moving slowly or fidgety/restless - - - - - - -  Suicidal thoughts - - - - - - -  PHQ-9 Score - - - - - - -  Difficult doing work/chores - - - - - - -  Some recent data might be hidden     Review of Systems  Musculoskeletal:  Positive for back pain.       Hamstring pain Pelvic pain  All other systems reviewed and are negative.     Objective:   Physical Exam Vitals and nursing note reviewed.  Constitutional:      Appearance: Normal appearance.  Cardiovascular:     Rate and Rhythm: Normal rate and regular rhythm.     Pulses: Normal pulses.     Heart sounds: Normal heart sounds.  Pulmonary:     Effort: Pulmonary effort is normal.     Breath sounds: Normal breath sounds.  Musculoskeletal:     Cervical back: Normal range  of motion and neck supple.     Comments: Normal Muscle Bulk  and Muscle Testing Reveals:  Upper Extremities: Full ROM and Muscle Strength 5/5  Thoracic Paraspinal Tenderness: T-7-T-9 Lumbar Hypersensitivity Bilateral Greater Trochanter  Lower Extremities: Full ROM and Muscle Strength 5/5 Arises from Table slowly using walker for support Antalgic Gait     Skin:    General: Skin is warm and dry.  Neurological:     Mental Status: She is alert and oriented to person, place, and time.  Psychiatric:        Mood and Affect: Mood normal.        Behavior: Behavior normal.         Assessment & Plan:  1. History of chronic lumbar facet disease with spondylosis/DDD/ L4-5 spondylolisthesis.With L4-5 L5-S1 decompression stabilization: 07/10/2021 Increased : Hydrocodone 10/325mg  one tablet 5 times a day  as needed for pain #120.  We will continue the opioid monitoring program, this consists of regular clinic visits, examinations, urine drug screen, pill counts as well as use of [place] Controlled Substance Reporting system. A 12 month History has been reviewed on the [facility] on 07/10/2021. 2. Depression with anxiety/ PTSD:  Dr. Sima Matas Following.Continue Xanax,Abilify and Effexor. 07/10/2021 3. Cervical spondylosis: Stable at this time with no complaints. Continue Medication Regime. 07/10/2021 4. 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 Ritalin: Refilled: Ritalin10 mg  One tablet at Breakfast and Lunch. # 31. Continue with  slow weaning.  07/10/2021. 5. Muscle Spasm: Continue current medication regimen with  Flexeril: May take 1-2 tablets at HS. 07/10/2021 6. Left Lumbar Radiculitis:  Continue HEP as Tolerated . Continue to Monitor. 07/10/2021. 7. Left Knee Osteoarthritis: No complaints today. S/P Left Knee Injection on 10/26/2018 with good relief noted.07/10/2021 8. Right Knee Pain: No complaints today. Continue to alternate with ice and heat therapy. Continue to  monitor.  07/10/2021 9. Bilateral Greater Trochanter Tenderness: Continue to alternate Ice and Heat therapy. Continue to monitor.      F/U in 1 month

## 2021-07-14 ENCOUNTER — Encounter: Payer: Self-pay | Admitting: Registered Nurse

## 2021-07-15 DIAGNOSIS — M5432 Sciatica, left side: Secondary | ICD-10-CM | POA: Diagnosis not present

## 2021-07-15 DIAGNOSIS — M625A2 Muscle wasting and atrophy, not elsewhere classified, back, lumbosacral: Secondary | ICD-10-CM | POA: Diagnosis not present

## 2021-07-15 DIAGNOSIS — R269 Unspecified abnormalities of gait and mobility: Secondary | ICD-10-CM | POA: Diagnosis not present

## 2021-07-15 DIAGNOSIS — M545 Low back pain, unspecified: Secondary | ICD-10-CM | POA: Diagnosis not present

## 2021-07-17 LAB — DRUG TOX MONITOR 1 W/CONF, ORAL FLD
Alprazolam: 0.61 ng/mL — ABNORMAL HIGH (ref <0.50)
Amphetamines: NEGATIVE ng/mL (ref <10)
Barbiturates: NEGATIVE ng/mL (ref <10)
Benzodiazepines: POSITIVE ng/mL — AB (ref <0.50)
Buprenorphine: NEGATIVE ng/mL (ref <0.10)
Chlordiazepoxide: NEGATIVE ng/mL (ref <0.50)
Clonazepam: NEGATIVE ng/mL (ref <0.50)
Cocaine: NEGATIVE ng/mL (ref <5.0)
Codeine: NEGATIVE ng/mL (ref <2.5)
Diazepam: NEGATIVE ng/mL (ref <0.50)
Dihydrocodeine: 11.8 ng/mL — ABNORMAL HIGH (ref <2.5)
Fentanyl: NEGATIVE ng/mL (ref <0.10)
Flunitrazepam: NEGATIVE ng/mL (ref <0.50)
Flurazepam: NEGATIVE ng/mL (ref <0.50)
Heroin Metabolite: NEGATIVE ng/mL (ref <1.0)
Hydrocodone: 90 ng/mL — ABNORMAL HIGH (ref <2.5)
Hydromorphone: NEGATIVE ng/mL (ref <2.5)
Lorazepam: NEGATIVE ng/mL (ref <0.50)
MARIJUANA: NEGATIVE ng/mL (ref <2.5)
MDMA: NEGATIVE ng/mL (ref <10)
Meprobamate: NEGATIVE ng/mL (ref <2.5)
Methadone: NEGATIVE ng/mL (ref <5.0)
Midazolam: NEGATIVE ng/mL (ref <0.50)
Morphine: NEGATIVE ng/mL (ref <2.5)
Nicotine Metabolite: NEGATIVE ng/mL (ref <5.0)
Nordiazepam: NEGATIVE ng/mL (ref <0.50)
Norhydrocodone: 5.2 ng/mL — ABNORMAL HIGH (ref <2.5)
Noroxycodone: NEGATIVE ng/mL (ref <2.5)
Opiates: POSITIVE ng/mL — AB (ref <2.5)
Oxazepam: NEGATIVE ng/mL (ref <0.50)
Oxycodone: NEGATIVE ng/mL (ref <2.5)
Oxymorphone: NEGATIVE ng/mL (ref <2.5)
Phencyclidine: NEGATIVE ng/mL (ref <10)
Tapentadol: NEGATIVE ng/mL (ref <5.0)
Temazepam: NEGATIVE ng/mL (ref <0.50)
Tramadol: NEGATIVE ng/mL (ref <5.0)
Triazolam: NEGATIVE ng/mL (ref <0.50)
Zolpidem: NEGATIVE ng/mL (ref <5.0)

## 2021-07-17 LAB — DRUG TOX METHYLPHEN W/CONF,ORAL FLD
Methylphenidate: 16.1 ng/mL — ABNORMAL HIGH (ref <1.0)
Methylphenidate: POSITIVE ng/mL — AB (ref <1.0)

## 2021-07-17 LAB — DRUG TOX ALC METAB W/CON, ORAL FLD: Alcohol Metabolite: NEGATIVE ng/mL (ref <25)

## 2021-07-18 ENCOUNTER — Telehealth: Payer: Self-pay | Admitting: Registered Nurse

## 2021-07-18 ENCOUNTER — Other Ambulatory Visit: Payer: Self-pay | Admitting: Family Medicine

## 2021-07-18 MED ORDER — OXYCODONE HCL 5 MG PO TABS: 5.0000 mg | ORAL_TABLET | Freq: Four times a day (QID) | ORAL | 0 refills | Status: DC | PRN

## 2021-07-18 NOTE — Telephone Encounter (Signed)
Placed a call to [name] regarding her My-Chart message. She is not receiving any relief of her pain with her current medication regimen of hydrocodone . Her hydrocodone was increased to 5 times a day, at her last office visit. We will prescribe oxycodone 5 mg 4 times a day as needed for pain, she was given permission to take it 5 times a day if pain persists. She verbalizes understanding.  She was instructed to send a My-Chart message on Monday, with a update, she verbalizes understanding.  This provider called [facility] regarding the above, her Oxycodone prescription was approved. [name] will begin the oxycodone on 07/19/2021, she verbalizes understanding, She will also lock up her Hydrocodone and bring it to the office on her next scheduled appointment to be destroyed , by office policy, she verbalizes understanding.

## 2021-07-22 DIAGNOSIS — M5416 Radiculopathy, lumbar region: Secondary | ICD-10-CM | POA: Diagnosis not present

## 2021-07-23 ENCOUNTER — Other Ambulatory Visit: Payer: Self-pay | Admitting: Neurological Surgery

## 2021-07-24 DIAGNOSIS — E039 Hypothyroidism, unspecified: Secondary | ICD-10-CM | POA: Diagnosis not present

## 2021-07-24 DIAGNOSIS — Z23 Encounter for immunization: Secondary | ICD-10-CM | POA: Diagnosis not present

## 2021-07-24 DIAGNOSIS — I10 Essential (primary) hypertension: Secondary | ICD-10-CM | POA: Diagnosis not present

## 2021-07-24 DIAGNOSIS — E785 Hyperlipidemia, unspecified: Secondary | ICD-10-CM | POA: Diagnosis not present

## 2021-07-24 DIAGNOSIS — F332 Major depressive disorder, recurrent severe without psychotic features: Secondary | ICD-10-CM | POA: Diagnosis not present

## 2021-07-25 ENCOUNTER — Other Ambulatory Visit: Payer: Self-pay | Admitting: Family Medicine

## 2021-07-28 ENCOUNTER — Telehealth: Payer: Self-pay | Admitting: *Deleted

## 2021-07-28 DIAGNOSIS — M5416 Radiculopathy, lumbar region: Secondary | ICD-10-CM | POA: Diagnosis not present

## 2021-07-28 NOTE — Pre-Procedure Instructions (Signed)
Surgical Instructions    Your procedure is scheduled on Friday, October 21st.  Report to [facility] Main Entrance "A" at 9:20 A.M., then check in with the Admitting office.  Call this number if you have problems the morning of surgery:  [phone]   If you have any questions prior to your surgery date call [phone]: Open Monday-Friday 8am-4pm    Remember:  Do not eat or drink after midnight the night before your surgery   Take these medicines the morning of surgery with A SIP OF WATER  acyclovir (ZOVIRAX)  atorvastatin (LIPITOR)  propranolol (INDERAL) RESTASIS  venlafaxine XR (EFFEXOR-XR)  As needed ALPRAZolam Duanne Moron) cyclobenzaprine (FLEXERIL) oxyCODONE (OXY IR/ROXICODONE)  As of today, STOP taking any Aspirin (unless otherwise instructed by your surgeon) Aleve, Naproxen, Ibuprofen, Motrin, Advil, Goody's, BC's, all herbal medications, fish oil, and all vitamins.                     Do NOT Smoke (Tobacco/Vaping) or drink Alcohol 24 hours prior to your procedure.  If you use a CPAP at night, you may bring all equipment for your overnight stay.   Contacts, glasses, piercing's, hearing aid's, dentures or partials may not be worn into surgery, please bring cases for these belongings.    For patients admitted to the hospital, discharge time will be determined by your treatment team.   Patients discharged the day of surgery will not be allowed to drive home, and someone needs to stay with them for 24 hours.  NO VISITORS WILL BE ALLOWED IN PRE-OP WHERE PATIENTS GET READY FOR SURGERY.  ONLY 1 SUPPORT PERSON MAY BE PRESENT IN THE WAITING ROOM WHILE YOU ARE IN SURGERY.  IF YOU ARE TO BE ADMITTED, ONCE YOU ARE IN YOUR ROOM YOU WILL BE ALLOWED TWO (2) VISITORS.  Minor children may have two parents present. Special consideration for safety and communication needs will be reviewed on a case by case basis.   Special instructions:   [facility]- Preparing For Surgery  Before  surgery, you can play an important role. Because skin is not sterile, your skin needs to be as free of germs as possible. You can reduce the number of germs on your skin by washing with CHG (chlorahexidine gluconate) Soap before surgery.  CHG is an antiseptic cleaner which kills germs and bonds with the skin to continue killing germs even after washing.    Oral Hygiene is also important to reduce your risk of infection.  Remember - BRUSH YOUR TEETH THE MORNING OF SURGERY WITH YOUR REGULAR TOOTHPASTE  Please do not use if you have an allergy to CHG or antibacterial soaps. If your skin becomes reddened/irritated stop using the CHG.  Do not shave (including legs and underarms) for at least 48 hours prior to first CHG shower. It is OK to shave your face.  Please follow these instructions carefully.   Shower the NIGHT BEFORE SURGERY and the MORNING OF SURGERY  If you chose to wash your hair, wash your hair first as usual with your normal shampoo.  After you shampoo, rinse your hair and body thoroughly to remove the shampoo.  Use CHG Soap as you would any other liquid soap. You can apply CHG directly to the skin and wash gently with a scrungie or a clean washcloth.   Apply the CHG Soap to your body ONLY FROM THE NECK DOWN.  Do not use on open wounds or open sores. Avoid contact with your eyes, ears, mouth  and genitals (private parts). Wash Face and genitals (private parts)  with your normal soap.   Wash thoroughly, paying special attention to the area where your surgery will be performed.  Thoroughly rinse your body with warm water from the neck down.  DO NOT shower/wash with your normal soap after using and rinsing off the CHG Soap.  Pat yourself dry with a CLEAN TOWEL.  Wear CLEAN PAJAMAS to bed the night before surgery  Place CLEAN SHEETS on your bed the night before your surgery  DO NOT SLEEP WITH PETS.   Day of Surgery: Shower with CHG soap. Do not wear jewelry, make up, nail  polish, gel polish, artificial nails, or any other type of covering on natural nails including finger and toenails. If patients have artificial nails, gel coating, etc. that need to be removed by a nail salon please have this removed prior to surgery. Surgery may need to be canceled/delayed if the surgeon/ anesthesia feels like the patient is unable to be adequately monitored. Do not wear lotions, powders, perfumes, or deodorant. Do not shave 48 hours prior to surgery.  Do not bring valuables to the hospital. [facility] is not responsible for any belongings or valuables. Wear Clean/Comfortable clothing the morning of surgery Remember to brush your teeth WITH YOUR REGULAR TOOTHPASTE.   Please read over the following fact sheets that you were given.   3 days prior to your procedure or After your COVID test   You are not required to quarantine however you are required to wear a well-fitting mask when you are out and around people not in your household. If your mask becomes wet or soiled, replace with a new one.   Wash your hands often with soap and water for 20 seconds or clean your hands with an alcohol-based hand sanitizer that contains at least 60% alcohol.   Do not share personal items.   Notify your provider:  o if you are in close contact with someone who has COVID  o or if you develop a fever of 100.4 or greater, sneezing, cough, sore throat, shortness of breath or body aches.

## 2021-07-28 NOTE — Telephone Encounter (Signed)
Oral swab drug screen was consistent for prescribed medications.  ?

## 2021-07-29 ENCOUNTER — Encounter (HOSPITAL_COMMUNITY)
Admission: RE | Admit: 2021-07-29 | Discharge: 2021-07-29 | Disposition: A | Discharge status: Home / Self Care | Payer: BC Managed Care – PPO | Source: Ambulatory Visit | Attending: Neurological Surgery | Admitting: Neurological Surgery

## 2021-07-29 ENCOUNTER — Encounter (HOSPITAL_COMMUNITY): Payer: Self-pay

## 2021-07-29 ENCOUNTER — Telehealth: Payer: Self-pay | Admitting: Registered Nurse

## 2021-07-29 ENCOUNTER — Other Ambulatory Visit: Payer: Self-pay

## 2021-07-29 DIAGNOSIS — Z01812 Encounter for preprocedural laboratory examination: Secondary | ICD-10-CM | POA: Diagnosis not present

## 2021-07-29 DIAGNOSIS — Z20822 Contact with and (suspected) exposure to covid-19: Secondary | ICD-10-CM | POA: Insufficient documentation

## 2021-07-29 LAB — CBC
HCT: 40.5 % (ref 36.0–46.0)
Hemoglobin: 13.2 g/dL (ref 12.0–15.0)
MCH: 27.9 pg (ref 26.0–34.0)
MCHC: 32.6 g/dL (ref 30.0–36.0)
MCV: 85.6 fL (ref 80.0–100.0)
Platelets: 225 10*3/uL (ref 150–400)
RBC: 4.73 MIL/uL (ref 3.87–5.11)
RDW: 13.1 % (ref 11.5–15.5)
WBC: 5.7 10*3/uL (ref 4.0–10.5)
nRBC: 0 % (ref 0.0–0.2)

## 2021-07-29 LAB — TYPE AND SCREEN
ABO/RH(D): O POS
Antibody Screen: NEGATIVE

## 2021-07-29 LAB — BASIC METABOLIC PANEL
Anion gap: 7 (ref 5–15)
BUN: 5 mg/dL — ABNORMAL LOW (ref 8–23)
CO2: 28 mmol/L (ref 22–32)
Calcium: 9.3 mg/dL (ref 8.9–10.3)
Chloride: 104 mmol/L (ref 98–111)
Creatinine, Ser: 0.87 mg/dL (ref 0.44–1.00)
GFR, Estimated: >60 mL/min (ref >60)
Glucose, Bld: 110 mg/dL — ABNORMAL HIGH (ref 70–99)
Potassium: 4.3 mmol/L (ref 3.5–5.1)
Sodium: 139 mmol/L (ref 135–145)

## 2021-07-29 LAB — SURGICAL PCR SCREEN
MRSA, PCR: NEGATIVE
Staphylococcus aureus: NEGATIVE

## 2021-07-29 LAB — SARS CORONAVIRUS 2 (TAT 6-24 HRS): SARS Coronavirus 2: NEGATIVE

## 2021-07-29 LAB — PROTIME-INR
INR: 1.1 (ref 0.8–1.2)
Prothrombin Time: 13.7 seconds (ref 11.4–15.2)

## 2021-07-29 NOTE — Progress Notes (Signed)
PCP - Dr. Bernerd Limbo Cardiologist - denies  Chest x-ray - 10/25/20-1 view EKG - 10/24/20 Stress Test - 09/20/14 ECHO - 09/19/14 Cardiac Cath - denies  Sleep Study - denies CPAP - denies  Blood Thinner Instructions: n/a Aspirin Instructions: n/a  COVID TEST- 07/29/21 done in PAT.  Anesthesia review: No  Patient denies shortness of breath, fever, cough and chest pain at PAT appointment   All instructions explained to the patient, with a verbal understanding of the material. Patient agrees to go over the instructions while at home for a better understanding. Patient also instructed to self quarantine after being tested for COVID-19. The opportunity to ask questions was provided.

## 2021-07-29 NOTE — Telephone Encounter (Signed)
Placed a call to [name] regarding her My-Chart message, no answer/ Left message to return the call.

## 2021-07-30 ENCOUNTER — Telehealth: Payer: Self-pay | Admitting: Registered Nurse

## 2021-07-30 NOTE — Telephone Encounter (Signed)
Placed a call to [name], regarding her pain.  Her oxycodone 5 mg tablets will be changed to  Two tablets ( 10 mg) three times a day as needed for pain.  She was instructed if her pain increases intensity, she may take the Oxycodone 10 mg 4 times a day, tonight. She was instructed to send a My-Chart message with a update on tomorrow 07/31/2021. She is scheduled for surgery on 08/01/2021.

## 2021-07-31 ENCOUNTER — Encounter: Payer: BC Managed Care – PPO | Admitting: Registered Nurse

## 2021-08-01 ENCOUNTER — Ambulatory Visit (HOSPITAL_COMMUNITY): Payer: BC Managed Care – PPO | Admitting: Anesthesiology

## 2021-08-01 ENCOUNTER — Encounter (HOSPITAL_COMMUNITY): Payer: Self-pay | Admitting: Neurological Surgery

## 2021-08-01 ENCOUNTER — Ambulatory Visit (HOSPITAL_COMMUNITY): Payer: BC Managed Care – PPO

## 2021-08-01 ENCOUNTER — Other Ambulatory Visit: Payer: Self-pay

## 2021-08-01 ENCOUNTER — Encounter (HOSPITAL_COMMUNITY)
Admission: RE | Disposition: A | Discharge status: Home / Self Care | Payer: Self-pay | Source: Home / Self Care | Attending: Neurological Surgery

## 2021-08-01 ENCOUNTER — Observation Stay (HOSPITAL_COMMUNITY)
Admission: RE | Admit: 2021-08-01 | Discharge: 2021-08-02 | Disposition: A | Discharge status: Home / Self Care | Payer: BC Managed Care – PPO | Attending: Neurological Surgery | Admitting: Neurological Surgery

## 2021-08-01 DIAGNOSIS — Z981 Arthrodesis status: Secondary | ICD-10-CM

## 2021-08-01 DIAGNOSIS — M4316 Spondylolisthesis, lumbar region: Secondary | ICD-10-CM | POA: Diagnosis not present

## 2021-08-01 DIAGNOSIS — Z85828 Personal history of other malignant neoplasm of skin: Secondary | ICD-10-CM | POA: Insufficient documentation

## 2021-08-01 DIAGNOSIS — Z79899 Other long term (current) drug therapy: Secondary | ICD-10-CM | POA: Diagnosis not present

## 2021-08-01 DIAGNOSIS — M48061 Spinal stenosis, lumbar region without neurogenic claudication: Principal | ICD-10-CM | POA: Insufficient documentation

## 2021-08-01 DIAGNOSIS — I10 Essential (primary) hypertension: Secondary | ICD-10-CM | POA: Diagnosis not present

## 2021-08-01 DIAGNOSIS — M4326 Fusion of spine, lumbar region: Secondary | ICD-10-CM | POA: Diagnosis not present

## 2021-08-01 DIAGNOSIS — K219 Gastro-esophageal reflux disease without esophagitis: Secondary | ICD-10-CM | POA: Diagnosis not present

## 2021-08-01 DIAGNOSIS — E785 Hyperlipidemia, unspecified: Secondary | ICD-10-CM | POA: Diagnosis not present

## 2021-08-01 DIAGNOSIS — Z419 Encounter for procedure for purposes other than remedying health state, unspecified: Secondary | ICD-10-CM

## 2021-08-01 SURGERY — POSTERIOR LUMBAR FUSION 1 LEVEL
Anesthesia: General | Site: Spine Lumbar

## 2021-08-01 MED ORDER — FENTANYL CITRATE (PF) 100 MCG/2ML IJ SOLN
INTRAMUSCULAR | Status: AC
  Filled 2021-08-01: qty 2

## 2021-08-01 MED ORDER — CELECOXIB 200 MG PO CAPS
200.0000 mg | ORAL_CAPSULE | Freq: Two times a day (BID) | ORAL | Status: DC
  Administered 2021-08-01 – 2021-08-02 (×2): 200 mg via ORAL
  Filled 2021-08-01 (×2): qty 1

## 2021-08-01 MED ORDER — SODIUM CHLORIDE 0.9% FLUSH: 3.0000 mL | INTRAVENOUS | Status: DC | PRN

## 2021-08-01 MED ORDER — METHYLPHENIDATE HCL 5 MG PO TABS
10.0000 mg | ORAL_TABLET | Freq: Two times a day (BID) | ORAL | Status: DC
  Administered 2021-08-02: 10 mg via ORAL
  Filled 2021-08-01: qty 2

## 2021-08-01 MED ORDER — ONDANSETRON HCL 4 MG/2ML IJ SOLN
INTRAMUSCULAR | Status: AC
  Filled 2021-08-01: qty 2

## 2021-08-01 MED ORDER — THROMBIN 5000 UNITS EX SOLR: OROMUCOSAL | Status: DC | PRN

## 2021-08-01 MED ORDER — DEXAMETHASONE 4 MG PO TABS
4.0000 mg | ORAL_TABLET | Freq: Four times a day (QID) | ORAL | Status: DC
  Administered 2021-08-01 – 2021-08-02 (×3): 4 mg via ORAL
  Filled 2021-08-01 (×3): qty 1

## 2021-08-01 MED ORDER — SCOPOLAMINE 1 MG/3DAYS TD PT72
1.0000 | MEDICATED_PATCH | TRANSDERMAL | Status: DC
  Administered 2021-08-01: 1.5 mg via TRANSDERMAL
  Filled 2021-08-01: qty 1

## 2021-08-01 MED ORDER — LIDOCAINE 2% (20 MG/ML) 5 ML SYRINGE
INTRAMUSCULAR | Status: AC
  Filled 2021-08-01: qty 5

## 2021-08-01 MED ORDER — PHENYLEPHRINE HCL-NACL 20-0.9 MG/250ML-% IV SOLN
INTRAVENOUS | Status: DC | PRN
  Administered 2021-08-01: 25 ug/min via INTRAVENOUS

## 2021-08-01 MED ORDER — ORAL CARE MOUTH RINSE: 15.0000 mL | Freq: Once | OROMUCOSAL | Status: AC

## 2021-08-01 MED ORDER — OXYCODONE HCL 5 MG PO TABS
10.0000 mg | ORAL_TABLET | ORAL | Status: DC | PRN
Start: 2021-08-01 — End: 2021-08-02
  Administered 2021-08-01 – 2021-08-02 (×4): 10 mg via ORAL
  Filled 2021-08-01 (×4): qty 2

## 2021-08-01 MED ORDER — AMISULPRIDE (ANTIEMETIC) 5 MG/2ML IV SOLN: 10.0000 mg | Freq: Once | INTRAVENOUS | Status: DC | PRN

## 2021-08-01 MED ORDER — DEXAMETHASONE SODIUM PHOSPHATE 10 MG/ML IJ SOLN
10.0000 mg | Freq: Once | INTRAMUSCULAR | Status: AC
  Administered 2021-08-01: 10 mg via INTRAVENOUS
  Filled 2021-08-01: qty 1

## 2021-08-01 MED ORDER — ONDANSETRON HCL 4 MG/2ML IJ SOLN
INTRAMUSCULAR | Status: DC | PRN
  Administered 2021-08-01: 4 mg via INTRAVENOUS

## 2021-08-01 MED ORDER — ROCURONIUM BROMIDE 10 MG/ML (PF) SYRINGE
PREFILLED_SYRINGE | INTRAVENOUS | Status: AC
  Filled 2021-08-01: qty 10

## 2021-08-01 MED ORDER — ONDANSETRON HCL 4 MG PO TABS: 4.0000 mg | ORAL_TABLET | Freq: Four times a day (QID) | ORAL | Status: DC | PRN

## 2021-08-01 MED ORDER — PROMETHAZINE HCL 25 MG/ML IJ SOLN: 6.2500 mg | INTRAMUSCULAR | Status: DC | PRN

## 2021-08-01 MED ORDER — PHENYLEPHRINE 40 MCG/ML (10ML) SYRINGE FOR IV PUSH (FOR BLOOD PRESSURE SUPPORT)
PREFILLED_SYRINGE | INTRAVENOUS | Status: AC
  Filled 2021-08-01: qty 10

## 2021-08-01 MED ORDER — MENTHOL 3 MG MT LOZG: 1.0000 | LOZENGE | OROMUCOSAL | Status: DC | PRN

## 2021-08-01 MED ORDER — ACETAMINOPHEN 650 MG RE SUPP: 650.0000 mg | RECTAL | Status: DC | PRN

## 2021-08-01 MED ORDER — ATORVASTATIN CALCIUM 80 MG PO TABS
80.0000 mg | ORAL_TABLET | Freq: Every day | ORAL | Status: DC
  Administered 2021-08-02: 80 mg via ORAL
  Filled 2021-08-01: qty 1

## 2021-08-01 MED ORDER — 0.9 % SODIUM CHLORIDE (POUR BTL) OPTIME
TOPICAL | Status: DC | PRN
  Administered 2021-08-01: 1000 mL

## 2021-08-01 MED ORDER — CEFAZOLIN SODIUM-DEXTROSE 2-4 GM/100ML-% IV SOLN
2.0000 g | Freq: Three times a day (TID) | INTRAVENOUS | Status: AC
  Administered 2021-08-01 – 2021-08-02 (×2): 2 g via INTRAVENOUS
  Filled 2021-08-01 (×2): qty 100

## 2021-08-01 MED ORDER — ACETAMINOPHEN 325 MG PO TABS: 650.0000 mg | ORAL_TABLET | ORAL | Status: DC | PRN

## 2021-08-01 MED ORDER — ACETAMINOPHEN 500 MG PO TABS
1000.0000 mg | ORAL_TABLET | Freq: Once | ORAL | Status: AC
  Filled 2021-08-01: qty 2

## 2021-08-01 MED ORDER — MIDAZOLAM HCL 2 MG/2ML IJ SOLN
INTRAMUSCULAR | Status: AC
  Filled 2021-08-01: qty 2

## 2021-08-01 MED ORDER — PROPOFOL 10 MG/ML IV BOLUS
INTRAVENOUS | Status: DC | PRN
  Administered 2021-08-01: 150 mg via INTRAVENOUS
  Administered 2021-08-01: 30 mg via INTRAVENOUS

## 2021-08-01 MED ORDER — CHLORHEXIDINE GLUCONATE CLOTH 2 % EX PADS: 6.0000 | MEDICATED_PAD | Freq: Once | CUTANEOUS | Status: DC

## 2021-08-01 MED ORDER — PHENYLEPHRINE 40 MCG/ML (10ML) SYRINGE FOR IV PUSH (FOR BLOOD PRESSURE SUPPORT)
PREFILLED_SYRINGE | INTRAVENOUS | Status: DC | PRN
Start: 2021-08-01 — End: 2021-08-01
  Administered 2021-08-01: 40 ug via INTRAVENOUS

## 2021-08-01 MED ORDER — DEXAMETHASONE SODIUM PHOSPHATE 4 MG/ML IJ SOLN: 4.0000 mg | Freq: Four times a day (QID) | INTRAMUSCULAR | Status: DC

## 2021-08-01 MED ORDER — PROPRANOLOL HCL 40 MG PO TABS
40.0000 mg | ORAL_TABLET | Freq: Two times a day (BID) | ORAL | Status: DC
  Administered 2021-08-01 – 2021-08-02 (×2): 40 mg via ORAL
  Filled 2021-08-01 (×3): qty 1

## 2021-08-01 MED ORDER — SODIUM CHLORIDE 0.9% FLUSH: 3.0000 mL | Freq: Two times a day (BID) | INTRAVENOUS | Status: DC

## 2021-08-01 MED ORDER — SODIUM CHLORIDE 0.9 % IV SOLN: 250.0000 mL | INTRAVENOUS | Status: DC

## 2021-08-01 MED ORDER — LIDOCAINE 2% (20 MG/ML) 5 ML SYRINGE
INTRAMUSCULAR | Status: DC | PRN
  Administered 2021-08-01: 100 mg via INTRAVENOUS

## 2021-08-01 MED ORDER — LACTATED RINGERS IV SOLN: INTRAVENOUS | Status: DC

## 2021-08-01 MED ORDER — CYCLOSPORINE 0.05 % OP EMUL
1.0000 [drp] | Freq: Two times a day (BID) | OPHTHALMIC | Status: DC
  Administered 2021-08-01 – 2021-08-02 (×2): 1 [drp] via OPHTHALMIC
  Filled 2021-08-01 (×3): qty 30

## 2021-08-01 MED ORDER — ROCURONIUM BROMIDE 10 MG/ML (PF) SYRINGE
PREFILLED_SYRINGE | INTRAVENOUS | Status: DC | PRN
  Administered 2021-08-01: 80 mg via INTRAVENOUS
  Administered 2021-08-01: 20 mg via INTRAVENOUS
  Administered 2021-08-01: 15 mg via INTRAVENOUS

## 2021-08-01 MED ORDER — THROMBIN 20000 UNITS EX SOLR
CUTANEOUS | Status: AC
  Filled 2021-08-01: qty 20000

## 2021-08-01 MED ORDER — CELECOXIB 200 MG PO CAPS
200.0000 mg | ORAL_CAPSULE | Freq: Once | ORAL | Status: AC
  Administered 2021-08-01: 200 mg via ORAL
  Filled 2021-08-01: qty 1

## 2021-08-01 MED ORDER — PROPOFOL 10 MG/ML IV BOLUS
INTRAVENOUS | Status: AC
  Filled 2021-08-01: qty 20

## 2021-08-01 MED ORDER — ONDANSETRON HCL 4 MG/2ML IJ SOLN: 4.0000 mg | Freq: Four times a day (QID) | INTRAMUSCULAR | Status: DC | PRN

## 2021-08-01 MED ORDER — FENTANYL CITRATE (PF) 100 MCG/2ML IJ SOLN
25.0000 ug | INTRAMUSCULAR | Status: DC | PRN
  Administered 2021-08-01: 50 ug via INTRAVENOUS
  Administered 2021-08-01: 25 ug via INTRAVENOUS

## 2021-08-01 MED ORDER — FENTANYL CITRATE (PF) 250 MCG/5ML IJ SOLN
INTRAMUSCULAR | Status: DC | PRN
  Administered 2021-08-01: 100 ug via INTRAVENOUS
  Administered 2021-08-01 (×3): 50 ug via INTRAVENOUS

## 2021-08-01 MED ORDER — ACETAMINOPHEN 500 MG PO TABS
ORAL_TABLET | ORAL | Status: AC
  Administered 2021-08-01: 1000 mg via ORAL
  Filled 2021-08-01: qty 1

## 2021-08-01 MED ORDER — CEFAZOLIN SODIUM-DEXTROSE 2-4 GM/100ML-% IV SOLN
2.0000 g | INTRAVENOUS | Status: AC
  Administered 2021-08-01: 2 g via INTRAVENOUS
  Filled 2021-08-01: qty 100

## 2021-08-01 MED ORDER — METHOCARBAMOL 1000 MG/10ML IJ SOLN
500.0000 mg | Freq: Four times a day (QID) | INTRAVENOUS | Status: DC | PRN
  Filled 2021-08-01: qty 5

## 2021-08-01 MED ORDER — BUPIVACAINE HCL (PF) 0.25 % IJ SOLN
INTRAMUSCULAR | Status: DC | PRN
  Administered 2021-08-01: 10 mL

## 2021-08-01 MED ORDER — THROMBIN 5000 UNITS EX SOLR
CUTANEOUS | Status: AC
  Filled 2021-08-01: qty 5000

## 2021-08-01 MED ORDER — METHOCARBAMOL 500 MG PO TABS
500.0000 mg | ORAL_TABLET | Freq: Four times a day (QID) | ORAL | Status: DC | PRN
  Administered 2021-08-01 – 2021-08-02 (×2): 500 mg via ORAL
  Filled 2021-08-01 (×2): qty 1

## 2021-08-01 MED ORDER — BUPIVACAINE HCL (PF) 0.25 % IJ SOLN
INTRAMUSCULAR | Status: AC
  Filled 2021-08-01: qty 30

## 2021-08-01 MED ORDER — THROMBIN 20000 UNITS EX SOLR: CUTANEOUS | Status: DC | PRN

## 2021-08-01 MED ORDER — PHENOL 1.4 % MT LIQD: 1.0000 | OROMUCOSAL | Status: DC | PRN

## 2021-08-01 MED ORDER — VENLAFAXINE HCL ER 37.5 MG PO CP24
37.5000 mg | ORAL_CAPSULE | Freq: Every day | ORAL | Status: DC
  Administered 2021-08-02: 37.5 mg via ORAL
  Filled 2021-08-01: qty 1

## 2021-08-01 MED ORDER — EPHEDRINE SULFATE-NACL 50-0.9 MG/10ML-% IV SOSY
PREFILLED_SYRINGE | INTRAVENOUS | Status: DC | PRN
  Administered 2021-08-01: 80 mg via INTRAVENOUS

## 2021-08-01 MED ORDER — FENTANYL CITRATE (PF) 250 MCG/5ML IJ SOLN
INTRAMUSCULAR | Status: AC
  Filled 2021-08-01: qty 5

## 2021-08-01 MED ORDER — MIDAZOLAM HCL 5 MG/5ML IJ SOLN
INTRAMUSCULAR | Status: DC | PRN
  Administered 2021-08-01: 1 mg via INTRAVENOUS

## 2021-08-01 MED ORDER — CHLORHEXIDINE GLUCONATE 0.12 % MT SOLN
15.0000 mL | Freq: Once | OROMUCOSAL | Status: AC
  Administered 2021-08-01: 15 mL via OROMUCOSAL
  Filled 2021-08-01: qty 15

## 2021-08-01 MED ORDER — DEXAMETHASONE SODIUM PHOSPHATE 10 MG/ML IJ SOLN
INTRAMUSCULAR | Status: AC
  Filled 2021-08-01: qty 1

## 2021-08-01 MED ORDER — MORPHINE SULFATE (PF) 2 MG/ML IV SOLN: 2.0000 mg | INTRAVENOUS | Status: DC | PRN

## 2021-08-01 MED ORDER — SUGAMMADEX SODIUM 200 MG/2ML IV SOLN
INTRAVENOUS | Status: DC | PRN
  Administered 2021-08-01: 200 mg via INTRAVENOUS

## 2021-08-01 MED ORDER — POTASSIUM CHLORIDE IN NACL 20-0.9 MEQ/L-% IV SOLN: INTRAVENOUS | Status: DC

## 2021-08-01 MED ORDER — ARIPIPRAZOLE 2 MG PO TABS
2.0000 mg | ORAL_TABLET | Freq: Every day | ORAL | Status: DC
  Administered 2021-08-02: 2 mg via ORAL
  Filled 2021-08-01: qty 1

## 2021-08-01 MED ORDER — SENNA 8.6 MG PO TABS
1.0000 | ORAL_TABLET | Freq: Two times a day (BID) | ORAL | Status: DC
  Administered 2021-08-01 – 2021-08-02 (×2): 8.6 mg via ORAL
  Filled 2021-08-01 (×2): qty 1

## 2021-08-01 SURGICAL SUPPLY — 59 items
BAG COUNTER SPONGE SURGICOUNT (BAG) ×2 IMPLANT
BASKET BONE COLLECTION (BASKET) ×2 IMPLANT
BENZOIN TINCTURE PRP APPL 2/3 (GAUZE/BANDAGES/DRESSINGS) ×2 IMPLANT
BIT DRILL PLIF MAS DISP 5.5MM (DRILL) ×1 IMPLANT
BLADE CLIPPER SURG (BLADE) IMPLANT
BONE MATRIX OSTEOCEL PRO MED (Bone Implant) ×2 IMPLANT
BUR CARBIDE MATCH 3.0 (BURR) ×2 IMPLANT
CAGE COROENT MP 8X9X23M-8 SPIN (Cage) ×4 IMPLANT
CANISTER SUCT 3000ML PPV (MISCELLANEOUS) ×2 IMPLANT
CLSR STERI-STRIP ANTIMIC 1/2X4 (GAUZE/BANDAGES/DRESSINGS) ×2 IMPLANT
CNTNR URN SCR LID CUP LEK RST (MISCELLANEOUS) ×1 IMPLANT
CONT SPEC 4OZ STRL OR WHT (MISCELLANEOUS) ×2
COVER BACK TABLE 60X90IN (DRAPES) ×2 IMPLANT
DERMABOND ADVANCED (GAUZE/BANDAGES/DRESSINGS) ×1
DERMABOND ADVANCED .7 DNX12 (GAUZE/BANDAGES/DRESSINGS) ×1 IMPLANT
DRAPE C-ARM 42X72 X-RAY (DRAPES) ×4 IMPLANT
DRAPE LAPAROTOMY 100X72X124 (DRAPES) ×2 IMPLANT
DRAPE SURG 17X23 STRL (DRAPES) ×2 IMPLANT
DRILL PLIF MAS DISP 5.5MM (DRILL) ×2
DRSG OPSITE POSTOP 4X6 (GAUZE/BANDAGES/DRESSINGS) ×2 IMPLANT
DURAPREP 26ML APPLICATOR (WOUND CARE) ×2 IMPLANT
ELECT REM PT RETURN 9FT ADLT (ELECTROSURGICAL) ×2
ELECTRODE REM PT RTRN 9FT ADLT (ELECTROSURGICAL) ×1 IMPLANT
EVACUATOR 1/8 PVC DRAIN (DRAIN) ×2 IMPLANT
GAUZE 4X4 16PLY ~~LOC~~+RFID DBL (SPONGE) IMPLANT
GLOVE SURG ENC MOIS LTX SZ7 (GLOVE) ×6 IMPLANT
GLOVE SURG ENC MOIS LTX SZ8 (GLOVE) ×6 IMPLANT
GLOVE SURG LTX SZ6.5 (GLOVE) ×10 IMPLANT
GLOVE SURG UNDER POLY LF SZ7 (GLOVE) ×6 IMPLANT
GOWN STRL REUS W/ TWL LRG LVL3 (GOWN DISPOSABLE) ×1 IMPLANT
GOWN STRL REUS W/ TWL XL LVL3 (GOWN DISPOSABLE) ×2 IMPLANT
GOWN STRL REUS W/TWL 2XL LVL3 (GOWN DISPOSABLE) IMPLANT
GOWN STRL REUS W/TWL LRG LVL3 (GOWN DISPOSABLE) ×2
GOWN STRL REUS W/TWL XL LVL3 (GOWN DISPOSABLE) ×4
HEMOSTAT POWDER KIT SURGIFOAM (HEMOSTASIS) ×2 IMPLANT
KIT BASIN OR (CUSTOM PROCEDURE TRAY) ×2 IMPLANT
KIT GRAFTMAG DEL NEURO DISP (NEUROSURGERY SUPPLIES) ×2 IMPLANT
KIT TURNOVER KIT B (KITS) ×2 IMPLANT
MILL MEDIUM DISP (BLADE) ×2 IMPLANT
NEEDLE HYPO 25X1 1.5 SAFETY (NEEDLE) ×2 IMPLANT
NS IRRIG 1000ML POUR BTL (IV SOLUTION) ×2 IMPLANT
PACK LAMINECTOMY NEURO (CUSTOM PROCEDURE TRAY) ×2 IMPLANT
PAD ARMBOARD 7.5X6 YLW CONV (MISCELLANEOUS) ×12 IMPLANT
ROD 60MM LUMBAR (Rod) ×4 IMPLANT
SCREW LOCK (Screw) ×12 IMPLANT
SCREW LOCK FXNS SPNE MAS PL (Screw) ×6 IMPLANT
SCREW SHANK PLIF MAS 5.5X40 (Screw) ×4 IMPLANT
SCREW TULIP 5.5 (Screw) ×4 IMPLANT
SPONGE SURGIFOAM ABS GEL 100 (HEMOSTASIS) IMPLANT
SPONGE T-LAP 4X18 ~~LOC~~+RFID (SPONGE) IMPLANT
STRIP CLOSURE SKIN 1/2X4 (GAUZE/BANDAGES/DRESSINGS) ×4 IMPLANT
SUT VIC AB 0 CT1 18XCR BRD8 (SUTURE) ×1 IMPLANT
SUT VIC AB 0 CT1 8-18 (SUTURE) ×2
SUT VIC AB 2-0 CP2 18 (SUTURE) ×2 IMPLANT
SUT VIC AB 3-0 SH 8-18 (SUTURE) ×6 IMPLANT
TOWEL GREEN STERILE (TOWEL DISPOSABLE) ×2 IMPLANT
TOWEL GREEN STERILE FF (TOWEL DISPOSABLE) ×2 IMPLANT
TRAY FOLEY MTR SLVR 16FR STAT (SET/KITS/TRAYS/PACK) ×2 IMPLANT
WATER STERILE IRR 1000ML POUR (IV SOLUTION) ×2 IMPLANT

## 2021-08-01 NOTE — Anesthesia Procedure Notes (Signed)
Procedure Name: Intubation Date/Time: 08/01/2021 12:31 PM Performed by: Colin Benton, CRNA Pre-anesthesia Checklist: Patient identified, Emergency Drugs available, Suction available and Patient being monitored Patient Re-evaluated:Patient Re-evaluated prior to induction Oxygen Delivery Method: Circle system utilized Preoxygenation: Pre-oxygenation with 100% oxygen Induction Type: IV induction Ventilation: Mask ventilation without difficulty Laryngoscope Size: Miller and 3 Grade View: Grade I Tube type: Oral Tube size: 7.0 mm Number of attempts: 1 Airway Equipment and Method: Stylet Placement Confirmation: ETT inserted through vocal cords under direct vision, positive ETCO2 and breath sounds checked- equal and bilateral Secured at: 22 cm Tube secured with: Tape Dental Injury: Teeth and Oropharynx as per pre-operative assessment

## 2021-08-01 NOTE — H&P (Signed)
Subjective: Patient is a 65 y.o. female admitted for lumbar fusion. Onset of symptoms was several months ago, gradually worsening since that time.  The pain is rated severe, and is located at the across the lower back and radiates to legs. The pain is described as aching and occurs all day. The symptoms have been progressive. Symptoms are exacerbated by exercise and standing. MRI or CT showed adjacent level spondylolisthesis with stenosis L3-4  Past Medical History:  Diagnosis Date   Anemia    ANXIETY 06/10/2007   Cataract    Chronic headaches    Degenerative arthritis of spine    back and neck   Depression    DYSLIPIDEMIA 06/28/2008   Esophageal stricture    Fibromyalgia    Gallstones    GERD 06/10/2007   Hiatal hernia    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   HYPERTENSION 06/10/2007   Irritable bowel syndrome 06/28/2008   OSTEOARTHRITIS 06/10/2007   Skin cancer    basal cell   Tubular adenoma of colon     Past Surgical History:  Procedure Laterality Date   ABDOMINAL HYSTERECTOMY  1999   bunions removed  1988   C4 C5 cage insertion  06/28/2013   CHOLECYSTECTOMY  04/25/2008   EDG  12/17/2004   ELECTROCARDIOGRAM  05/27/2007   L5 S1 rod insertion  11/07/2012   SKIN CANCER EXCISION     SKIN CANCER EXCISION     several   [employer]    Prior to Admission medications   Medication Sig Start Date End Date Taking? Authorizing Provider  acyclovir (ZOVIRAX) 800 MG tablet Take 1 tablet (800 mg total) by mouth daily as needed (outbreak). 02/20/21  Yes Leamon Arnt, MD  ALPRAZolam Duanne Moron) 1 MG tablet TAKE 1/2 TAB BY MOUTH 3 TIMES A DAY AS NEEDED FOR ANXIETY 04/30/21  Yes Danella Sensing L, NP  ARIPiprazole (ABILIFY) 2 MG tablet TAKE 1 TABLET BY MOUTH EVERY DAY 06/06/21  Yes Bayard Hugger, NP  atorvastatin (LIPITOR) 80 MG tablet Take 80 mg by mouth daily.   Yes [provider]  cyclobenzaprine (FLEXERIL) 5 MG tablet TAKE 1 TABLET (5 MG TOTAL) BY MOUTH AT BEDTIME AS  NEEDED FOR MUSCLE SPASMS. THREE MONTH SUPPLY 05/14/20  Yes Bayard Hugger, NP  famotidine (PEPCID) 20 MG tablet Take 20 mg by mouth at bedtime. 12/23/20  Yes [provider]  methylphenidate (RITALIN) 10 MG tablet Take 1 tablet (10 mg total) by mouth 2 (two) times daily with breakfast and lunch. [id number]. Do Not Fill Before 07/02/2021 06/04/21  Yes Danella Sensing L, NP  oxyCODONE (OXY IR/ROXICODONE) 5 MG immediate release tablet Take 1 tablet (5 mg total) by mouth 4 (four) times daily as needed for moderate pain. 07/18/21  Yes Bayard Hugger, NP  propranolol (INDERAL) 40 MG tablet TAKE 1 TABLET BY MOUTH TWICE A DAY 10/18/20  Yes Leamon Arnt, MD  RESTASIS 0.05 % ophthalmic emulsion Place 1 drop into both eyes 2 (two) times daily. 08/31/20  Yes [provider]  venlafaxine XR (EFFEXOR-XR) 150 MG 24 hr capsule ONE CAPSULE DAILY WITH BREAKFAST. 12/09/20  Yes Bayard Hugger, NP  albuterol (VENTOLIN HFA) 108 (90 Base) MCG/ACT inhaler Inhale 2 puffs into the lungs every 6 (six) hours as needed for wheezing or shortness of breath. Patient not taking: No sig reported 10/26/20   Jonetta Osgood, MD  benzonatate (TESSALON PERLES) 100 MG capsule Take 1 capsule (100 mg total) by mouth  every 6 (six) hours as needed for cough. Patient not taking: No sig reported 10/26/20 10/26/21  Jonetta Osgood, MD  irbesartan (AVAPRO) 75 MG tablet Take 1 tablet (75 mg total) by mouth daily. Patient not taking: Reported on 07/24/2021 10/24/19   Leamon Arnt, MD   Allergies  Allergen Reactions   Gabapentin Other (See Comments)    Bad headaches Other reaction(s): hallucination   Poison Ivy Extract [Poison Ivy Extract] Rash   Poison Oak Extract [Poison Oak Extract] Rash   Poison Sumac Extract Rash    Social History   Tobacco Use   Smoking status: Never   Smokeless tobacco: Never  Substance Use Topics   Alcohol use: No    Alcohol/week: 0.0 standard drinks    Family History  Problem Relation  Age of Onset   Cervical cancer Mother    Diabetes Mother    Stroke Mother    Alzheimer's disease Mother    Heart attack Father    Stroke Father    Colon cancer Father    Anxiety disorder Maternal Aunt    Depression Maternal Aunt    Anxiety disorder Maternal Uncle    Depression Maternal Uncle        suicide attempt by gun shot wound to head. Survived   Alcohol abuse Other    Drug abuse Other    Breast cancer Maternal Grandmother    Diabetes Maternal Grandmother    Breast cancer Paternal Aunt    Colon polyps Sister        x 2     Review of Systems  Positive ROS: neg  All other systems have been reviewed and were otherwise negative with the exception of those mentioned in the HPI and as above.  Objective: Vital signs in last 24 hours: Temp:  [98.6 F (37 C)] 98.6 F (37 C) (10/21 0916) Pulse Rate:  [120] 120 (10/21 0916) Resp:  [20] 20 (10/21 0916) BP: (160)/(91) 160/91 (10/21 0916) SpO2:  [95 %] 95 % (10/21 0916) Weight:  [78.1 kg] 78.1 kg (10/21 0916)  General Appearance: Alert, cooperative, no distress, appears stated age Head: Normocephalic, without obvious abnormality, atraumatic Eyes: PERRL, conjunctiva/corneas clear, EOM's intact    Neck: Supple, symmetrical, trachea midline Back: Symmetric, no curvature, ROM normal, no CVA tenderness Lungs:  respirations unlabored Heart: Regular rate and rhythm Abdomen: Soft, non-tender Extremities: Extremities normal, atraumatic, no cyanosis or edema Pulses: 2+ and symmetric all extremities Skin: Skin color, texture, turgor normal, no rashes or lesions  NEUROLOGIC:   Mental status: Alert and oriented x4,  no aphasia, good attention span, fund of knowledge, and memory Motor Exam - grossly normal Sensory Exam - grossly normal Reflexes: 1+ Coordination - grossly normal Gait - grossly normal Balance - grossly normal Cranial Nerves: I: smell Not tested  II: visual acuity  OS: nl    OD: nl  II: visual fields Full to  confrontation  II: pupils Equal, round, reactive to light  III,VII: ptosis None  III,IV,VI: extraocular muscles  Full ROM  V: mastication Normal  V: facial light touch sensation  Normal  V,VII: corneal reflex  Present  VII: facial muscle function - upper  Normal  VII: facial muscle function - lower Normal  VIII: hearing Not tested  IX: soft palate elevation  Normal  IX,X: gag reflex Present  XI: trapezius strength  5/5  XI: sternocleidomastoid strength 5/5  XI: neck flexion strength  5/5  XII: tongue strength  Normal    Data Review  Lab Results  Component Value Date   WBC 5.7 07/29/2021   HGB 13.2 07/29/2021   HCT 40.5 07/29/2021   MCV 85.6 07/29/2021   PLT 225 07/29/2021   Lab Results  Component Value Date   NA 139 07/29/2021   K 4.3 07/29/2021   CL 104 07/29/2021   CO2 28 07/29/2021   BUN 5 (L) 07/29/2021   CREATININE 0.87 07/29/2021   GLUCOSE 110 (H) 07/29/2021   Lab Results  Component Value Date   INR 1.1 07/29/2021    Assessment/Plan:  Estimated body mass index is 32.54 kg/m as calculated from the following:   Height as of this encounter: 5\' 1"  (1.549 m).   Weight as of this encounter: 78.1 kg. Patient admitted for posterior lumbar interbody fusion L3-4 with laminectomy and extension of instrumentation. Patient has failed a reasonable attempt at conservative therapy.  I explained the condition and procedure to the patient and answered any questions.  Patient wishes to proceed with procedure as planned. Understands risks/ benefits and typical outcomes of procedure.   Eustace Moore 08/01/2021 11:42 AM

## 2021-08-01 NOTE — Anesthesia Postprocedure Evaluation (Signed)
Anesthesia Post Note  Patient: [name]  Procedure(s) Performed: Posterior Lumbar Iinterbody Fusion - Lumabr three-Lumbar four (Spine Lumbar)     Patient location during evaluation: PACU Anesthesia Type: General Level of consciousness: sedated Pain management: pain level controlled Vital Signs Assessment: post-procedure vital signs reviewed and stable Respiratory status: spontaneous breathing and respiratory function stable Cardiovascular status: stable Postop Assessment: no apparent nausea or vomiting Anesthetic complications: no   No notable events documented.  Last Vitals:  Vitals:   08/01/21 1600 08/01/21 1623  BP: (!) 145/86 140/80  Pulse: (!) 107 (!) 108  Resp: 15 20  Temp: 37.4 C 36.9 C  SpO2: 96% 96%    Last Pain:  Vitals:   08/01/21 1623  TempSrc: Oral  PainSc:                  Jalin Alicea DANIEL

## 2021-08-01 NOTE — Transfer of Care (Signed)
Immediate Anesthesia Transfer of Care Note  Patient: [name]  Procedure(s) Performed: Posterior Lumbar Iinterbody Fusion - Lumabr three-Lumbar four (Spine Lumbar)  Patient Location: PACU  Anesthesia Type:General  Level of Consciousness: awake and drowsy  Airway & Oxygen Therapy: Patient Spontanous Breathing and Patient connected to face mask oxygen  Post-op Assessment: Report given to RN and Post -op Vital signs reviewed and stable  Post vital signs: Reviewed and stable  Last Vitals:  Vitals Value Taken Time  BP    Temp    Pulse 108 08/01/21 1530  Resp 19 08/01/21 1530  SpO2 96 % 08/01/21 1530  Vitals shown include unvalidated device data.  Last Pain:  Vitals:   08/01/21 0932  TempSrc:   PainSc: 8       Patients Stated Pain Goal: 3 (76/70/11 0034)  Complications: No notable events documented.

## 2021-08-01 NOTE — Anesthesia Preprocedure Evaluation (Addendum)
Anesthesia Evaluation  Patient identified by MRN, date of birth, ID band Patient awake    Reviewed: Allergy & Precautions, NPO status , Patient's Chart, lab work & pertinent test results  History of Anesthesia Complications Negative for: history of anesthetic complications  Airway Mallampati: I  TM Distance: >3 FB     Dental  (+) Edentulous Upper, Edentulous Lower, Dental Advisory Given   Pulmonary neg pulmonary ROS,    Pulmonary exam normal        Cardiovascular hypertension, Pt. on home beta blockers Normal cardiovascular exam     Neuro/Psych PSYCHIATRIC DISORDERS Anxiety Depression negative neurological ROS     GI/Hepatic Neg liver ROS, hiatal hernia, GERD  ,  Endo/Other  Hypothyroidism   Renal/GU negative Renal ROS     Musculoskeletal  (+) Fibromyalgia -, narcotic dependent  Abdominal   Peds  Hematology negative hematology ROS (+)   Anesthesia Other Findings   Reproductive/Obstetrics                            Anesthesia Physical Anesthesia Plan  ASA: 3  Anesthesia Plan: General   Post-op Pain Management:    Induction: Intravenous  PONV Risk Score and Plan: 4 or greater and Ondansetron, Dexamethasone, Scopolamine patch - Pre-op and Midazolam  Airway Management Planned: Oral ETT  Additional Equipment:   Intra-op Plan:   Post-operative Plan: Extubation in OR  Informed Consent: I have reviewed the patients History and Physical, chart, labs and discussed the procedure including the risks, benefits and alternatives for the proposed anesthesia with the patient or authorized representative who has indicated his/her understanding and acceptance.       Plan Discussed with: Anesthesiologist and CRNA  Anesthesia Plan Comments:        Anesthesia Quick Evaluation

## 2021-08-01 NOTE — Op Note (Signed)
08/01/2021  3:17 PM  PATIENT:  [name]  65 y.o. 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 Decompressive lumbar laminectomy, hemi facetectomy and foraminotomies L3-4 requiring more work than would be required for a simple exposure of the disk for PLIF in order to adequately decompress the neural elements and address the spinal stenosis 2. Posterior lumbar interbody fusion L3-4 using peek interbody cages packed with morcellized allograft and autograft  3. Posterior fixation L3-L5 inclusive using NuVasive cortical pedicle screws.  4. Intertransverse arthrodesis L3-4 right using morcellized autograft and allograft. 5.  Removal of segmental instrumentation L4-S1 with exploration of fusion L4-5  SURGEON:  Sherley Bounds, MD  ASSISTANTS: Glenford Peers FNP  ANESTHESIA:  General  EBL: 450 ml  Total I/O In: 1300 [I.V.:1200; IV Piggyback:100] Out: 585 [Urine:135; Blood:450]  BLOOD ADMINISTERED:none  DRAINS: none   INDICATION FOR PROCEDURE: This patient presented with back and leg pain. Imaging revealed adjacent level retrolisthesis with stenosis at L3-4 above previous L4-S1 fusion. The patient tried a reasonable attempt at conservative medical measures without relief. I recommended decompression and instrumented fusion to address the stenosis as well as the segmental  instability.  Patient understood the risks, benefits, and alternatives and potential outcomes and wished to proceed.  PROCEDURE DETAILS:  The patient was brought to the operating room. After induction of generalized endotracheal anesthesia the patient was rolled into the prone position on chest rolls and all pressure points were padded. The patient's lumbar region was cleaned and then prepped with DuraPrep and draped in the usual sterile fashion. Anesthesia was injected and then a dorsal midline  incision was made and carried down to the lumbosacral fascia. The fascia was opened and the paraspinous musculature was taken down in a subperiosteal fashion to expose L3-4 and the previously placed instrumentation. A self-retaining retractor was placed.  We remove the locking caps from the pedicle screws of L4-S1 bilaterally.  We remove the rods.  All of the screws had good purchase.  In pulling on each screw successively there was unified motion of the screws suggesting arthrodesis . intraoperative fluoroscopy confirmed my level, and I started with placement of the L3 cortical pedicle screws. The pedicle screw entry zones were identified utilizing surface landmarks and  AP and lateral fluoroscopy. I scored the cortex with the high-speed drill and then used the hand drill to drill an upward and outward direction into the pedicle. I then tapped line to line. I then placed a 5.5 x 40 mm cortical pedicle screw into the pedicles of L3 bilaterally.    I then turned my attention to the decompression and complete lumbar laminectomies, hemi- facetectomies, and foraminotomies were performed at L3-4.  My nurse practitioner was directly involved in the decompression and exposure of the neural elements. the patient had significant spinal stenosis and this required more work than would be required for a simple exposure of the disc for posterior lumbar interbody fusion which would only require a limited laminotomy. Much more generous decompression and generous foraminotomy was undertaken in order to adequately decompress the neural elements and address the patient's leg pain. The yellow ligament was removed to expose the underlying dura and nerve roots, and generous foraminotomies were performed to adequately decompress the neural elements. Both the exiting and traversing nerve roots were decompressed on both sides until a coronary dilator passed easily along the nerve roots. Once the decompression was  complete, I turned my  attention to the posterior lower lumbar interbody fusion. The epidural venous vasculature was coagulated and cut sharply. Disc space was incised and the initial discectomy was performed with pituitary rongeurs. The disc space was distracted with sequential distractors to a height of 8 mm. We then used a series of scrapers and shavers to prepare the endplates for fusion. The midline was prepared with Epstein curettes. Once the complete discectomy was finished, we packed an appropriate sized interbody cage with local autograft and morcellized allograft, gently retracted the nerve root, and tapped the cage into position at L3-4.  The midline between the cages was packed with morselized autograft and allograft.     We then decorticated the transverse processes and laid a mixture of morcellized autograft and allograft out over these to perform intertransverse arthrodesis at L3-4 on the right. We then placed lordotic rods into the multiaxial screw heads of the pedicle screws at L3-L4 and L5 and locked these in position with the locking caps and anti-torque device. We then checked our construct with AP and lateral fluoroscopy. Irrigated with copious amounts of bacitracin-containing saline solution. Inspected the nerve roots once again to assure adequate decompression, lined to the dura with Gelfoam,  and then we closed the muscle and the fascia with 0 Vicryl. Closed the subcutaneous tissues with 2-0 Vicryl and subcuticular tissues with 3-0 Vicryl. The skin was closed with benzoin and Steri-Strips. Dressing was then applied, the patient was awakened from general anesthesia and transported to the recovery room in stable condition. At the end of the procedure all sponge, needle and instrument counts were correct.   PLAN OF CARE: admit to inpatient  PATIENT DISPOSITION:  PACU - hemodynamically stable.   Delay start of Pharmacological VTE agent (>24hrs) due to surgical blood loss or risk of bleeding:  yes

## 2021-08-02 DIAGNOSIS — Z79899 Other long term (current) drug therapy: Secondary | ICD-10-CM | POA: Diagnosis not present

## 2021-08-02 DIAGNOSIS — M4316 Spondylolisthesis, lumbar region: Secondary | ICD-10-CM | POA: Diagnosis not present

## 2021-08-02 DIAGNOSIS — Z85828 Personal history of other malignant neoplasm of skin: Secondary | ICD-10-CM | POA: Diagnosis not present

## 2021-08-02 DIAGNOSIS — M48061 Spinal stenosis, lumbar region without neurogenic claudication: Secondary | ICD-10-CM | POA: Diagnosis not present

## 2021-08-02 DIAGNOSIS — I10 Essential (primary) hypertension: Secondary | ICD-10-CM | POA: Diagnosis not present

## 2021-08-02 MED ORDER — OXYCODONE HCL 10 MG PO TABS: 10.0000 mg | ORAL_TABLET | ORAL | 0 refills | Status: DC | PRN

## 2021-08-02 MED ORDER — DEXAMETHASONE 1 MG PO TABS: ORAL_TABLET | ORAL | 0 refills | Status: DC

## 2021-08-02 MED ORDER — METHOCARBAMOL 500 MG PO TABS: 500.0000 mg | ORAL_TABLET | Freq: Four times a day (QID) | ORAL | 3 refills | Status: DC | PRN

## 2021-08-02 NOTE — Progress Notes (Signed)
Patient was transported to her vehicle via wheelchair by RN for discharge home; in no acute distress nor complaints of pain nor discomfort; incision on her back with honeycomb dressing was clean, dry and intact with LSO brace on; room was checked and accounted for all her belongings; discharge instructions concerning her medications, wound care, follow up appointment and when to call the doctor were all discussed with the patient and significant other by RN and both expressed understanding on the instructions given.

## 2021-08-02 NOTE — Evaluation (Signed)
Occupational Therapy Evaluation Patient Details Name: [name] MRN: [id number] DOB: 1955/11/08 Today's Date: 08/02/2021   History of Present Illness 65 y.o. 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 PMHx: anxiety, cataract, degenerative arthitis of spine, fibromyalgia, hypertension, OA   Clinical Impression   Wave was indep prior to the above lumbar [place]. She lives in a 1 level home, 2-3 STE with her husband who works during the day. Upon evaluation pt demonstrated good ability to complete ADLs with supervision and VCs to maintain back precautions and use compensatory techniques. Pt was supervision without AD overall for transfers and functional mobility. All education completed, pt with no further acute OT needs. Recommend d/c to home with supervision for ADLs and mobility initally.      Recommendations for follow up therapy are one component of a multi-disciplinary discharge planning process, led by the attending physician.  Recommendations may be updated based on patient status, additional functional criteria and insurance authorization.   Follow Up Recommendations  No OT follow up;Supervision - Intermittent    Equipment Recommendations  None recommended by OT       Precautions / Restrictions Precautions Precautions: Back;Fall Precaution Booklet Issued: Yes (comment) Required Braces or Orthoses: Spinal Brace Spinal Brace: Lumbar corset;Applied in sitting position Restrictions Weight Bearing Restrictions: No      Mobility Bed Mobility Overal bed mobility: Needs Assistance Bed Mobility: Rolling;Sidelying to Sit;Sit to Sidelying Rolling: Min guard Sidelying to sit: Min guard     Sit to sidelying: Min guard General bed mobility comments: min guard fro safety - vc for log roll    Transfers Overall transfer level: Needs assistance   Transfers: Sit to/from Stand Sit to  Stand: Supervision         General transfer comment: supervision for safwty only    Balance Overall balance assessment: No apparent balance deficits (not formally assessed)                                         ADL either performed or assessed with clinical judgement   ADL Overall ADL's : Needs assistance/impaired Eating/Feeding: Independent;Sitting   Grooming: Oral care;Supervision/safety;Cueing for compensatory techniques;Standing   Upper Body Bathing: Supervision/ safety;Sitting;Cueing for compensatory techniques   Lower Body Bathing: Min guard;Sit to/from stand;Cueing for compensatory techniques;Cueing for back precautions   Upper Body Dressing : Supervision/safety;Sitting Upper Body Dressing Details (indicate cue type and reason): including back brace Lower Body Dressing: Supervision/safety;Sit to/from stand;Cueing for compensatory techniques;Cueing for back precautions   Toilet Transfer: Supervision/safety;Ambulation   Toileting- Clothing Manipulation and Hygiene: Supervision/safety;Sit to/from stand       Functional mobility during ADLs: Supervision/safety General ADL Comments: Pt demonstrated good ability to complete ADls while maintaing back precautions     Vision Baseline Vision/History: 0 No visual deficits Ability to See in Adequate Light: 0 Adequate Patient Visual Report: No change from baseline Vision Assessment?: No apparent visual deficits     Perception     Praxis      Pertinent Vitals/Pain Pain Assessment: Faces Faces Pain Scale: Hurts a little bit Pain Location: sx site Pain Descriptors / Indicators: Discomfort Pain Intervention(s): Monitored during session     Hand Dominance Right   Extremity/Trunk Assessment Upper Extremity Assessment Upper Extremity Assessment: Overall WFL for tasks assessed   Lower Extremity Assessment Lower Extremity  Assessment: Overall WFL for tasks assessed   Cervical / Trunk  Assessment Cervical / Trunk Assessment: Other exceptions Cervical / Trunk Exceptions: s/p lumbar sx   Communication Communication Communication: No difficulties   Cognition Arousal/Alertness: Awake/alert Behavior During Therapy: WFL for tasks assessed/performed Overall Cognitive Status: Within Functional Limits for tasks assessed                                     General Comments  VSS on RA - husband present and supportive throguhout    Exercises     Shoulder Instructions      Home Living Family/patient expects to be discharged to:: Private residence Living Arrangements: Spouse/significant other Available Help at Discharge: Available 24 hours/day (husband and sister) Type of Home: House Home Access: Stairs to enter [employer] of Steps: 3 in the back, 2 in the front Entrance Stairs-Rails: [place]: One level     Bathroom Shower/Tub: Walk-in Hydrologist: [place]: Environmental consultant - 2 wheels;Cane - single point          Prior Functioning/Environment Level of Independence: Independent        Comments: drives        OT Problem List: Decreased activity tolerance;Impaired balance (sitting and/or standing);Decreased knowledge of precautions;Decreased safety awareness;Pain      OT Treatment/Interventions:      OT Goals(Current goals can be found in the care plan section) Acute Rehab OT Goals Patient Stated Goal: home asap OT Goal Formulation: All assessment and education complete, DC therapy   AM-PAC OT "6 Clicks" Daily Activity     Outcome Measure Help from another person eating meals?: None Help from another person taking care of personal grooming?: None Help from another person toileting, which includes using toliet, bedpan, or urinal?: None Help from another person bathing (including washing, rinsing, drying)?: None Help from another person to put on and taking off regular upper body  clothing?: None Help from another person to put on and taking off regular lower body clothing?: None 6 Click Score: 24   End of Session Equipment Utilized During Treatment: Back brace Nurse Communication: Mobility status  Activity Tolerance: Patient tolerated treatment well Patient left: in bed;with call bell/phone within reach;with family/visitor present  OT Visit Diagnosis: Other abnormalities of gait and mobility (R26.89);Muscle weakness (generalized) (M62.81);Pain                Time: 4970-2637 OT Time Calculation (min): 21 min Charges:  OT General Charges $OT Visit: 1 Visit OT Evaluation $OT Eval Low Complexity: 1 Low   Trayon Krantz A Navina Wohlers 08/02/2021, 8:29 AM

## 2021-08-02 NOTE — Evaluation (Signed)
Physical Therapy Evaluation and Discharge Patient Details Name: [name] MRN: [id number] DOB: [birth year] Today's Date: 08/02/2021  History of Present Illness  65 y.o. 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 PMHx: anxiety, cataract, degenerative arthitis of spine, fibromyalgia, hypertension, OA  Clinical Impression  Patient evaluated by Physical Therapy with no further acute PT needs identified. Pt overall reporting good pain control and is moving well. Pt ambulating hallway distances with no assistive device and negotiated a half flight of stairs without physical difficulty. Education provided regarding spinal precautions, home set up, activity/exercise recommendations, brace use. Of note, pt reports intermittent bilateral feet numbness and color changes; encouraged her to follow up with her PCP regarding this. All education has been completed and the patient has no further questions. No follow-up Physical Therapy or equipment needs. PT is signing off. Thank you for this referral.      Recommendations for follow up therapy are one component of a multi-disciplinary discharge planning process, led by the attending physician.  Recommendations may be updated based on patient status, additional functional criteria and insurance authorization.  Follow Up Recommendations No PT follow up    Equipment Recommendations  None recommended by PT    Recommendations for Other Services       Precautions / Restrictions Precautions Precautions: Back;Fall Precaution Booklet Issued: Yes (comment) Required Braces or Orthoses: Spinal Brace Spinal Brace: Lumbar corset;Applied in sitting position Restrictions Weight Bearing Restrictions: No      Mobility  Bed Mobility Overal bed mobility: Needs Assistance Bed Mobility: Rolling;Sidelying to Sit;Sit to Sidelying Rolling: Min guard Sidelying to sit: Min  guard     Sit to sidelying: Min guard General bed mobility comments: Standing in room upon arrival; verbally reviewed technique    Transfers Overall transfer level: Independent Equipment used: None Transfers: Sit to/from Stand Sit to Stand: Supervision         General transfer comment: supervision for safwty only  Ambulation/Gait Ambulation/Gait assistance: Independent Gait Distance (Feet): 400 Feet Assistive device: None Gait Pattern/deviations: Step-through pattern;Decreased stride length     General Gait Details: Mildly decreased bilateral foot clearance, particularly with increased distance  Stairs Stairs: Yes Stairs assistance: Modified independent (Device/Increase time) Stair Management: One rail Left Number of Stairs: 12 General stair comments: step over step technique  Wheelchair Mobility    Modified Rankin (Stroke Patients Only)       Balance Overall balance assessment: No apparent balance deficits (not formally assessed)                                           Pertinent Vitals/Pain Pain Assessment: Faces Faces Pain Scale: Hurts a little bit Pain Location: sx site Pain Descriptors / Indicators: Discomfort Pain Intervention(s): Monitored during session    Home Living Family/patient expects to be discharged to:: Private residence Living Arrangements: Spouse/significant other Available Help at Discharge: Available 24 hours/day (husband and sister) Type of Home: House Home Access: Stairs to enter Entrance Stairs-Rails: Psychiatric nurse of Steps: 3 in the back, 2 in the front Home Layout: One level Home Equipment: Walker - 2 wheels;Cane - single point      Prior Function Level of Independence: Independent         Comments: drives     Hand Dominance   Dominant Hand: Right  Extremity/Trunk Assessment   Upper Extremity Assessment Upper Extremity Assessment: Overall WFL for tasks assessed     Lower Extremity Assessment Lower Extremity Assessment: LLE deficits/detail;RLE deficits/detail RLE Deficits / Details: Strength 5/5 LLE Deficits / Details: Strength 5/5    Cervical / Trunk Assessment Cervical / Trunk Assessment: Other exceptions Cervical / Trunk Exceptions: s/p lumbar sx  Communication   Communication: No difficulties  Cognition Arousal/Alertness: Awake/alert Behavior During Therapy: WFL for tasks assessed/performed Overall Cognitive Status: Within Functional Limits for tasks assessed                                        General Comments General comments (skin integrity, edema, etc.): VSS on RA - husband present and supportive throguhout    Exercises     Assessment/Plan    PT Assessment Patent does not need any further PT services  PT Problem List         PT Treatment Interventions      PT Goals (Current goals can be found in the Care Plan section)  Acute Rehab PT Goals Patient Stated Goal: home asap PT Goal Formulation: All assessment and education complete, DC therapy    Frequency     Barriers to discharge        Co-evaluation               AM-PAC PT "6 Clicks" Mobility  Outcome Measure Help needed turning from your back to your side while in a flat bed without using bedrails?: None Help needed moving from lying on your back to sitting on the side of a flat bed without using bedrails?: None Help needed moving to and from a bed to a chair (including a wheelchair)?: None Help needed standing up from a chair using your arms (e.g., wheelchair or bedside chair)?: None Help needed to walk in hospital room?: None Help needed climbing 3-5 steps with a railing? : None 6 Click Score: 24    End of Session Equipment Utilized During Treatment: Back brace Activity Tolerance: Patient tolerated treatment well Patient left: in bed;with call bell/phone within reach;with family/visitor present Nurse Communication: Mobility  status PT Visit Diagnosis: Difficulty in walking, not elsewhere classified (R26.2);Pain Pain - part of body:  (back)    Time: 1638-4665 PT Time Calculation (min) (ACUTE ONLY): 17 min   Charges:   PT Evaluation $PT Eval Low Complexity: [place], PT, DPT Acute Rehabilitation Services Pager [phone] Office [phone]   Deno Etienne 08/02/2021, 9:30 AM

## 2021-08-02 NOTE — Discharge Summary (Signed)
Physician Discharge Summary  Patient ID: [name] MRN: [id number] DOB/AGE: [birth year] 65 y.o.  Admit date: 08/01/2021 Discharge date: 08/02/2021  Admission Diagnoses: Lumbar stenosis L3-L4 status post arthrodesis L4 to sacrum  Discharge Diagnoses: Lumbar stenosis L3-L4.  Status post arthrodesis L4 to sacrum. Active Problems:   S/P lumbar fusion   Discharged Condition: good  Hospital Course: Patient was admitted to undergo surgery to decompress L3-L4 and revise a previous fusion from L4 to sacrum to include the L3 level.  She tolerated surgery well.  Consults: None  Significant Diagnostic Studies: None  Treatments: surgery: See op note  Discharge Exam: Blood pressure 113/72, pulse 88, temperature 98.2 F (36.8 C), temperature source Oral, resp. rate 16, height 5\' 1"  (1.549 m), weight 78.1 kg, SpO2 98 %. Incision is clean and dry motor function is intact patient is ambulatory.  Station and gait are intact  Disposition: Discharge disposition: 01-Home or Self Care       Discharge Instructions     Call MD for:  redness, tenderness, or signs of infection (pain, swelling, redness, odor or green/yellow discharge around incision site)   Complete by: As directed    Call MD for:  severe uncontrolled pain   Complete by: As directed    Call MD for:  temperature >100.4   Complete by: As directed    Diet - low sodium heart healthy   Complete by: As directed    Discharge wound care:   Complete by: As directed    Okay to shower. Do not apply salves or appointments to incision. No heavy lifting with the upper extremities greater than 10 pounds. May resume driving when not requiring pain medication and patient feels comfortable with doing so.   Increase activity slowly   Complete by: As directed       Allergies as of 08/02/2021       Reactions   Gabapentin Other (See Comments)   Bad headaches Other reaction(s): hallucination   Poison Ivy Extract [poison Ivy Extract]  Rash   Poison Oak Extract [poison Oak Extract] Rash   Poison Sumac Extract Rash        Medication List     TAKE these medications    acyclovir 800 MG tablet Commonly known as: ZOVIRAX Take 1 tablet (800 mg total) by mouth daily as needed (outbreak).   albuterol 108 (90 Base) MCG/ACT inhaler Commonly known as: VENTOLIN HFA Inhale 2 puffs into the lungs every 6 (six) hours as needed for wheezing or shortness of breath.   ALPRAZolam 1 MG tablet Commonly known as: XANAX TAKE 1/2 TAB BY MOUTH 3 TIMES A DAY AS NEEDED FOR ANXIETY   ARIPiprazole 2 MG tablet Commonly known as: ABILIFY TAKE 1 TABLET BY MOUTH EVERY DAY   atorvastatin 80 MG tablet Commonly known as: LIPITOR Take 80 mg by mouth daily.   benzonatate 100 MG capsule Commonly known as: Tessalon Perles Take 1 capsule (100 mg total) by mouth every 6 (six) hours as needed for cough.   cyclobenzaprine 5 MG tablet Commonly known as: FLEXERIL TAKE 1 TABLET (5 MG TOTAL) BY MOUTH AT BEDTIME AS NEEDED FOR MUSCLE SPASMS. THREE MONTH SUPPLY   dexamethasone 1 MG tablet Commonly known as: DECADRON 2 tablets twice daily for 2 days, one tablet twice daily for 2 days, one tablet daily for 2 days.   famotidine 20 MG tablet Commonly known as: PEPCID Take 20 mg by mouth at bedtime.   irbesartan 75 MG tablet Commonly known as: Avapro  Take 1 tablet (75 mg total) by mouth daily.   methocarbamol 500 MG tablet Commonly known as: ROBAXIN Take 1 tablet (500 mg total) by mouth every 6 (six) hours as needed for muscle spasms.   methylphenidate 10 MG tablet Commonly known as: RITALIN Take 1 tablet (10 mg total) by mouth 2 (two) times daily with breakfast and lunch. [id number]. Do Not Fill Before 07/02/2021   Oxycodone HCl 10 MG Tabs Take 1 tablet (10 mg total) by mouth every 3 (three) hours as needed for severe pain ((score 7 to 10)). What changed:  medication strength how much to take when to take this reasons to take this    propranolol 40 MG tablet Commonly known as: INDERAL TAKE 1 TABLET BY MOUTH TWICE A DAY   Restasis 0.05 % ophthalmic emulsion Generic drug: cycloSPORINE Place 1 drop into both eyes 2 (two) times daily.   venlafaxine XR 150 MG 24 hr capsule Commonly known as: EFFEXOR-XR ONE CAPSULE DAILY WITH BREAKFAST.               Durable Medical Equipment  (From admission, onward)           Start     Ordered   08/01/21 1627  DME Walker rolling  Once       Question:  Patient needs a walker to treat with the following condition  Answer:  S/P lumbar fusion   08/01/21 1626   08/01/21 1627  DME 3 n 1  Once        08/01/21 1626              Discharge Care Instructions  (From admission, onward)           Start     Ordered   08/02/21 0000  Discharge wound care:       Comments: Okay to shower. Do not apply salves or appointments to incision. No heavy lifting with the upper extremities greater than 10 pounds. May resume driving when not requiring pain medication and patient feels comfortable with doing so.   08/02/21 0811             Signed: Blanchie Dessert [name] 08/02/2021, 8:11 AM

## 2021-08-02 NOTE — Plan of Care (Signed)
  Problem: Education: Goal: Ability to verbalize activity precautions or restrictions will improve Outcome: Completed/Met Goal: Knowledge of the prescribed therapeutic regimen will improve Outcome: Completed/Met Goal: Understanding of discharge needs will improve Outcome: Completed/Met   Problem: Bowel/Gastric: Goal: Gastrointestinal status for postoperative course will improve Outcome: Completed/Met   Problem: Clinical Measurements: Goal: Ability to maintain clinical measurements within normal limits will improve Outcome: Completed/Met Goal: Postoperative complications will be avoided or minimized Outcome: Completed/Met Goal: Diagnostic test results will improve Outcome: Completed/Met   Problem: Skin Integrity: Goal: Will show signs of wound healing Outcome: Completed/Met   Problem: Health Behavior/Discharge Planning: Goal: Identification of resources available to assist in meeting health care needs will improve Outcome: Completed/Met

## 2021-08-20 ENCOUNTER — Other Ambulatory Visit: Payer: Self-pay | Admitting: Family Medicine

## 2021-08-25 IMAGING — MG MM DIGITAL SCREENING BILAT W/ TOMO AND CAD
8 series · 8 of 24 positions shown · non-contrast
Comparison: Previous exam(s).

CLINICAL DATA: Screening.

EXAM:
DIGITAL SCREENING BILATERAL MAMMOGRAM WITH TOMOSYNTHESIS AND CAD
TECHNIQUE: Bilateral screening digital craniocaudal and mediolateral oblique
mammograms were obtained. Bilateral screening digital breast
tomosynthesis was performed. The images were evaluated with
computer-aided detection.

[R MLO synth-2D]
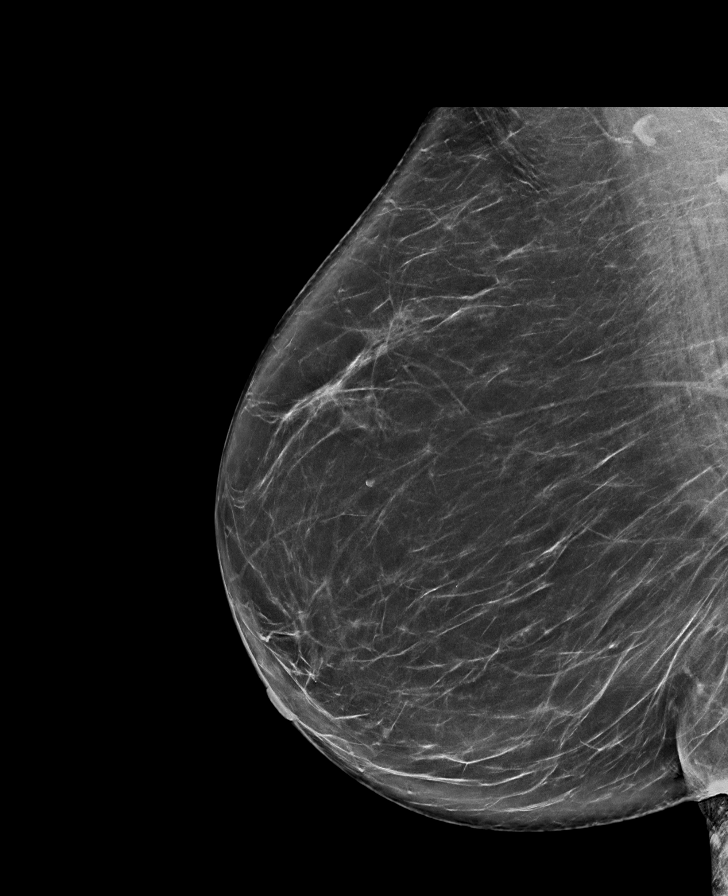

[L MLO synth-2D]
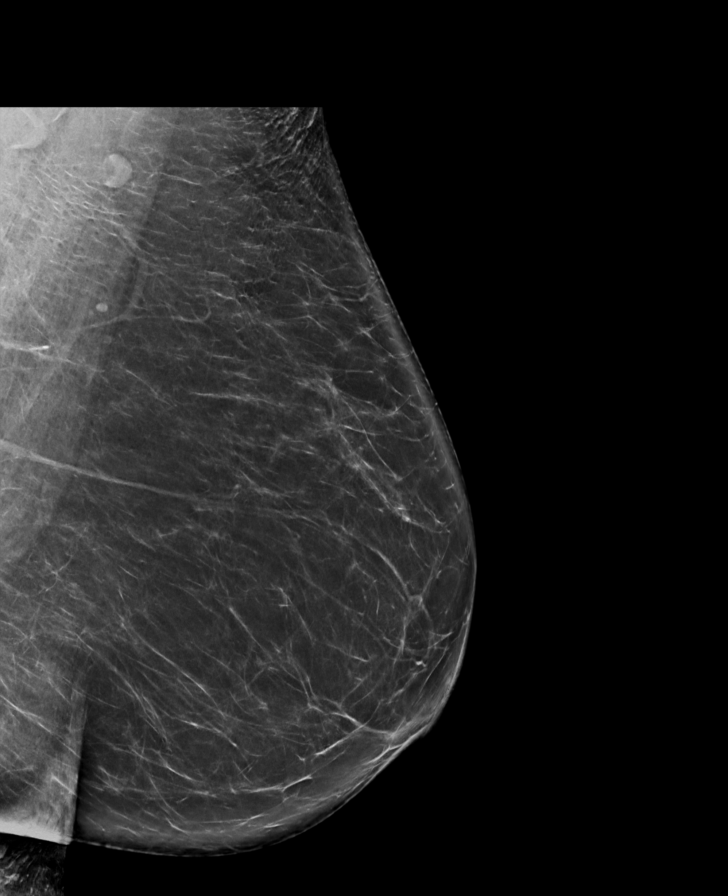

[R CC synth-2D]
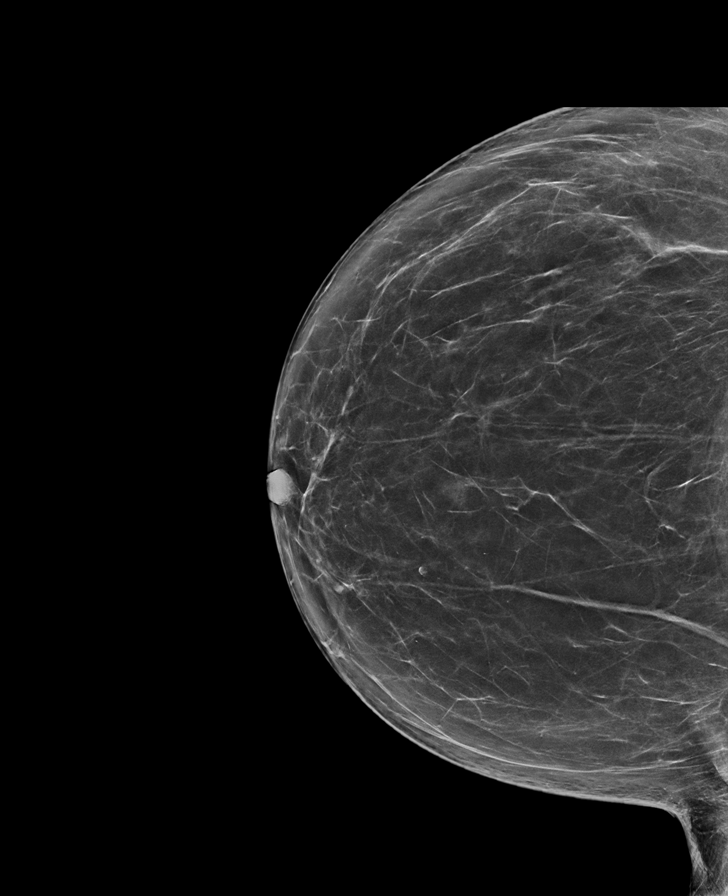

[L CC synth-2D]
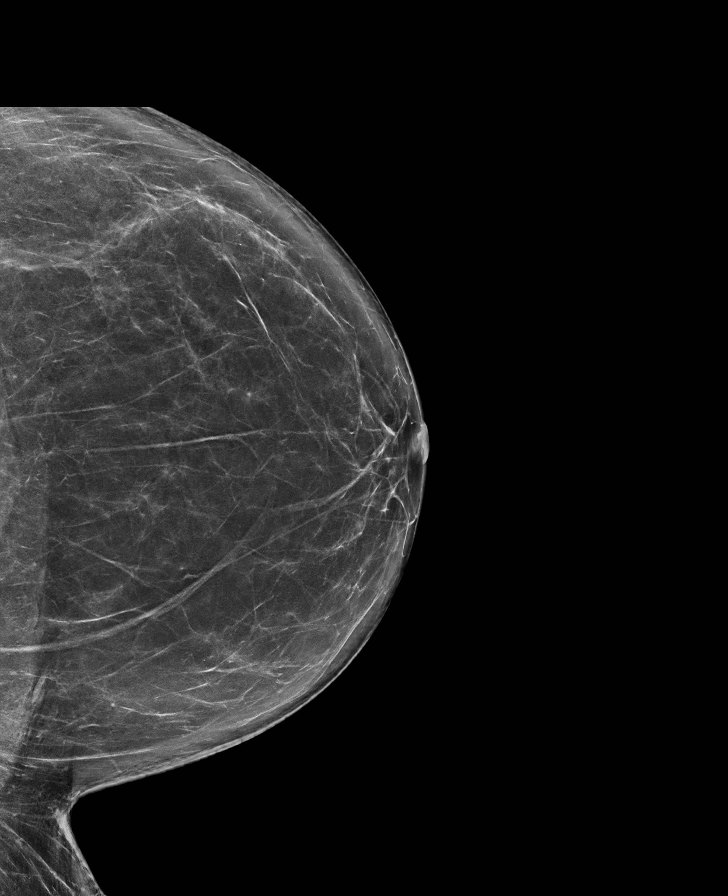

[L MLO tomo · tomo slice 47/92.0]
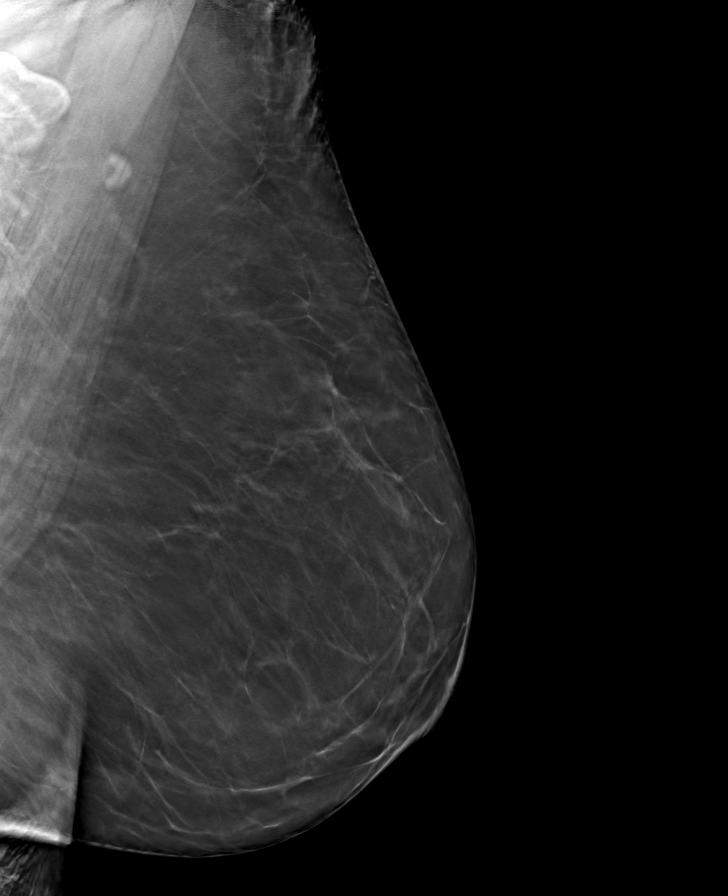

[R CC tomo · tomo slice 45/88.0]
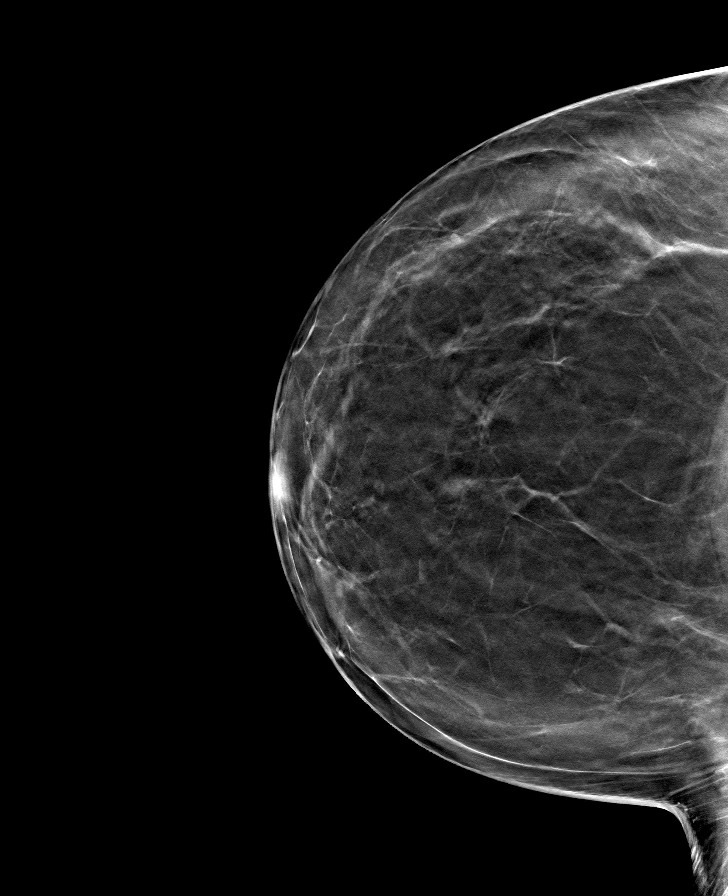

[L CC tomo · tomo slice 43/85.0]
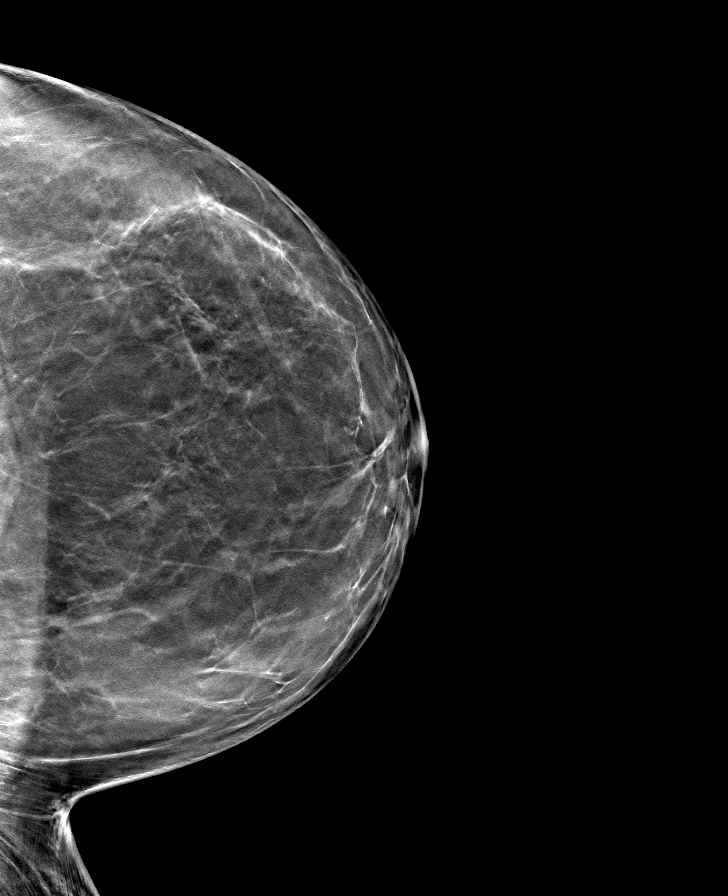

[R MLO tomo · tomo slice 47/92.0]
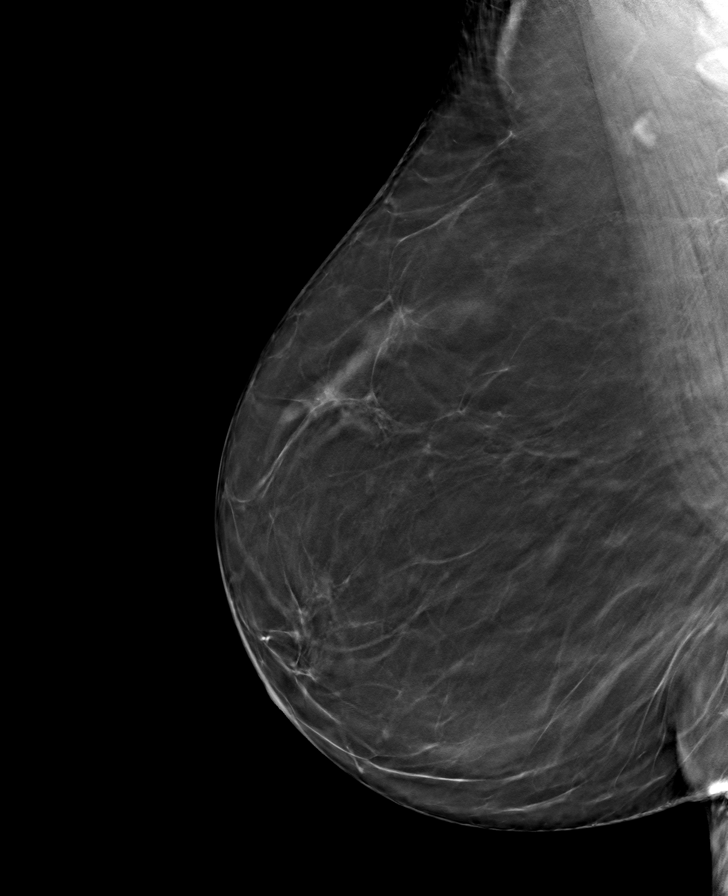

[8 of 24 positions shown; findings below may reference images not displayed]

ACR Breast Density Category b: There are scattered areas of
fibroglandular density.
FINDINGS: There are no findings suspicious for malignancy. The images were
evaluated with computer-aided detection.
IMPRESSION: No mammographic evidence of malignancy. A result letter of this
screening mammogram will be mailed directly to the patient.

RECOMMENDATION:
Screening mammogram in one year. (Code:WJ-I-BG6)

BI-RADS CATEGORY  1: Negative.

## 2021-08-28 ENCOUNTER — Encounter: Payer: BC Managed Care – PPO | Admitting: Registered Nurse

## 2021-09-01 ENCOUNTER — Encounter: Payer: Self-pay | Admitting: Registered Nurse

## 2021-09-01 ENCOUNTER — Other Ambulatory Visit: Payer: Self-pay

## 2021-09-01 ENCOUNTER — Encounter: Payer: BC Managed Care – PPO | Attending: Physical Medicine and Rehabilitation | Admitting: Registered Nurse

## 2021-09-01 ENCOUNTER — Ambulatory Visit: Payer: BC Managed Care – PPO | Admitting: Registered Nurse

## 2021-09-01 VITALS — BP 130/88 | HR 99 | Ht 61.0 in | Wt 169.0 lb

## 2021-09-01 DIAGNOSIS — M47816 Spondylosis without myelopathy or radiculopathy, lumbar region: Secondary | ICD-10-CM | POA: Insufficient documentation

## 2021-09-01 DIAGNOSIS — Z79891 Long term (current) use of opiate analgesic: Secondary | ICD-10-CM | POA: Insufficient documentation

## 2021-09-01 DIAGNOSIS — G894 Chronic pain syndrome: Secondary | ICD-10-CM | POA: Diagnosis not present

## 2021-09-01 DIAGNOSIS — Z5181 Encounter for therapeutic drug level monitoring: Secondary | ICD-10-CM | POA: Insufficient documentation

## 2021-09-01 DIAGNOSIS — M797 Fibromyalgia: Secondary | ICD-10-CM | POA: Diagnosis not present

## 2021-09-01 DIAGNOSIS — F0781 Postconcussional syndrome: Secondary | ICD-10-CM | POA: Diagnosis not present

## 2021-09-01 DIAGNOSIS — M545 Low back pain, unspecified: Secondary | ICD-10-CM | POA: Insufficient documentation

## 2021-09-01 DIAGNOSIS — M961 Postlaminectomy syndrome, not elsewhere classified: Secondary | ICD-10-CM | POA: Insufficient documentation

## 2021-09-01 DIAGNOSIS — G8929 Other chronic pain: Secondary | ICD-10-CM | POA: Insufficient documentation

## 2021-09-01 MED ORDER — METHYLPHENIDATE HCL 10 MG PO TABS: 10.0000 mg | ORAL_TABLET | Freq: Two times a day (BID) | ORAL | 0 refills | Status: DC

## 2021-09-01 MED ORDER — ALPRAZOLAM 1 MG PO TABS: ORAL_TABLET | ORAL | 3 refills | Status: DC

## 2021-09-01 MED ORDER — HYDROCODONE-ACETAMINOPHEN 10-325 MG PO TABS: 1.0000 | ORAL_TABLET | Freq: Four times a day (QID) | ORAL | 0 refills | Status: DC | PRN

## 2021-09-01 NOTE — Progress Notes (Signed)
Subjective:    Patient ID: [name], female    DOB: [birth year], 65 y.o.   MRN: [id number]  HPI: [name] is a 65 y.o. female who returns for follow up appointment for chronic pain and medication refill. She states her pain is located in her lower back. She rates her pain 7. Her current exercise regime is walking.  [name] was admitted to [facility] 08/01/2021 and discharged 08/02/2021, she underwent  Posterior Lumbar Iinterbody Fusion - Lumabr three-Lumbar four, on 08/01/2021 by Dr Ronnald Ramp    Ms. Stupka Morphine equivalent is 60.33 MME.   Last Oral Swab was Performed on 07/10/2021, it was consistent.   Pain Inventory Average Pain 7 Pain Right Now 7 My pain is dull and tenderness  In the last 24 hours, has pain interfered with the following? General activity 7 Relation with others 7 Enjoyment of life 7 What TIME of day is your pain at its worst? morning  Sleep (in general) Good  Pain is worse with: walking, bending, and standing Pain improves with: rest and medication Relief from Meds: 7  Family History  Problem Relation Age of Onset   Cervical cancer Mother    Diabetes Mother    Stroke Mother    Alzheimer's disease Mother    Heart attack Father    Stroke Father    Colon cancer Father    Anxiety disorder Maternal Aunt    Depression Maternal Aunt    Anxiety disorder Maternal Uncle    Depression Maternal Uncle        suicide attempt by gun shot wound to head. Survived   Alcohol abuse Other    Drug abuse Other    Breast cancer Maternal Grandmother    Diabetes Maternal Grandmother    Breast cancer Paternal Aunt    Colon polyps Sister        x 2   Social History   Socioeconomic History   Marital status: Married    Spouse name: Not on file   Number of children: 0   Years of education: Not on file   Highest education level: Not on file  Occupational History   Occupation: disabled  Tobacco Use   Smoking status: Never   Smokeless tobacco:  Never  Vaping Use   Vaping Use: Never used  Substance and Sexual Activity   Alcohol use: No    Alcohol/week: 0.0 standard drinks   Drug use: No   Sexual activity: Yes  Other Topics Concern   Not on file  Social History Narrative   Pt has h.s. degree   Social Determinants of Health   Financial Resource Strain: Not on file  Food Insecurity: Not on file  Transportation Needs: Not on file  Physical Activity: Not on file  Stress: Not on file  Social Connections: Not on file   Past Surgical History:  Procedure Laterality Date   ABDOMINAL HYSTERECTOMY  1999   bunions removed  1988   C4 C5 cage insertion  06/28/2013   CHOLECYSTECTOMY  04/25/2008   EDG  12/17/2004   ELECTROCARDIOGRAM  05/27/2007   L5 S1 rod insertion  11/07/2012   SKIN CANCER EXCISION     SKIN CANCER EXCISION     several   [place]   Past Surgical History:  Procedure Laterality Date   ABDOMINAL HYSTERECTOMY  1999   bunions removed  1988   C4 C5 cage insertion  06/28/2013   CHOLECYSTECTOMY  04/25/2008   EDG  12/17/2004   ELECTROCARDIOGRAM  05/27/2007   L5 S1 rod insertion  11/07/2012   SKIN CANCER EXCISION     SKIN CANCER EXCISION     several   TONSILLECTOMY  1981   TUBAL LIGATION  1997   Past Medical History:  Diagnosis Date   Anemia    ANXIETY 06/10/2007   Cataract    Chronic headaches    Degenerative arthritis of spine    back and neck   Depression    DYSLIPIDEMIA 06/28/2008   Esophageal stricture    Fibromyalgia    Gallstones    GERD 06/10/2007   Hiatal hernia    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   HYPERTENSION 06/10/2007   Irritable bowel syndrome 06/28/2008   OSTEOARTHRITIS 06/10/2007   Skin cancer    basal cell   Tubular adenoma of colon    Ht 5\' 1"  (1.549 m)   Wt 169 lb (76.7 kg)   BMI 31.93 kg/m   Opioid Risk Score:   Fall Risk Score:  `1  Depression screen PHQ 2/9  Depression screen [facility] 2/9 09/01/2021 06/04/2021 04/30/2021 03/05/2021 01/09/2021 11/12/2020 10/15/2020   Decreased Interest 0 1 1 0 1 0 1  Down, Depressed, Hopeless 0 1 1 0 1 0 1  PHQ - 2 Score 0 2 2 0 2 0 2  Altered sleeping - - - - - - -  Tired, decreased energy - - - - - - -  Change in appetite - - - - - - -  Feeling bad or failure about yourself  - - - - - - -  Trouble concentrating - - - - - - -  Moving slowly or fidgety/restless - - - - - - -  Suicidal thoughts - - - - - - -  PHQ-9 Score - - - - - - -  Difficult doing work/chores - - - - - - -  Some recent data might be hidden     Review of Systems  Constitutional: Negative.   HENT: Negative.    Eyes: Negative.   Respiratory: Negative.    Cardiovascular: Negative.   Gastrointestinal: Negative.   Endocrine: Negative.   Genitourinary: Negative.   Musculoskeletal:  Positive for back pain.  Skin: Negative.   Allergic/Immunologic: Negative.   Neurological:  Positive for tremors.  Hematological: Negative.   Psychiatric/Behavioral: Negative.        Objective:   Physical Exam        Assessment & Plan:  1. History of chronic lumbar facet disease with spondylosis/DDD/ L4-5 spondylolisthesis.With L4-5 L5-S1 decompression stabilization:  She underwent Posterior Lumbar Iinterbody Fusion - Lumabr three-Lumbar four, on 08/01/2021 by Dr Ronnald Ramp. Neurosurgery Following 09/01/2021 Refilled:Hydrocodone 10/325mg  one tablet 4 times a day  as needed for pain #120.  We will continue the opioid monitoring program, this consists of regular clinic visits, examinations, urine drug screen, pill counts as well as use of [place] Controlled Substance Reporting system. A 12 month History has been reviewed on the [place] on 09/01/2021. 2. Depression with anxiety/ PTSD:  Dr. Sima Matas Following.Continue Xanax,Abilify and Effexor. 09/01/2021 3. Cervical spondylosis: Stable at this time with no complaints. Continue Medication Regime. 09/01/2021 4. 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 Ritalin: Refilled: Ritalin10 mg  One tablet at Breakfast and Lunch. # 36. Continue with  slow weaning.  11/212022. 5. Muscle Spasm: Continue current medication regimen with  Flexeril: May take 1-2 tablets at  HS. 09/01/2021 6. Left Lumbar Radiculitis: No complaints today. Continue HEP as Tolerated . Continue to Monitor. 09/01/2021. 7. Left Knee Osteoarthritis: No complaints today. S/P Left Knee Injection on 10/26/2018 with good relief noted.09/01/2021 8. Right Knee Pain: No complaints today. Continue to alternate with ice and heat therapy. Continue to monitor.  09/01/2021 9. Bilateral Greater Trochanter Tenderness: No complaints today. Continue to alternate Ice and Heat therapy. Continue to monitor. 09/01/2021     F/U in 1 month

## 2021-09-09 ENCOUNTER — Encounter: Payer: BC Managed Care – PPO | Admitting: Physical Medicine & Rehabilitation

## 2021-09-11 DIAGNOSIS — M5416 Radiculopathy, lumbar region: Secondary | ICD-10-CM | POA: Diagnosis not present

## 2021-09-16 DIAGNOSIS — J069 Acute upper respiratory infection, unspecified: Secondary | ICD-10-CM | POA: Diagnosis not present

## 2021-09-16 DIAGNOSIS — G250 Essential tremor: Secondary | ICD-10-CM | POA: Diagnosis not present

## 2021-09-16 DIAGNOSIS — E785 Hyperlipidemia, unspecified: Secondary | ICD-10-CM | POA: Diagnosis not present

## 2021-10-07 ENCOUNTER — Encounter: Payer: Self-pay | Admitting: Registered Nurse

## 2021-10-07 ENCOUNTER — Other Ambulatory Visit: Payer: Self-pay

## 2021-10-07 ENCOUNTER — Encounter: Payer: BC Managed Care – PPO | Attending: Physical Medicine and Rehabilitation | Admitting: Registered Nurse

## 2021-10-07 VITALS — BP 140/82 | HR 55 | Temp 99.1°F | Ht 61.0 in | Wt 170.0 lb

## 2021-10-07 DIAGNOSIS — M961 Postlaminectomy syndrome, not elsewhere classified: Secondary | ICD-10-CM

## 2021-10-07 DIAGNOSIS — Z5181 Encounter for therapeutic drug level monitoring: Secondary | ICD-10-CM | POA: Diagnosis present

## 2021-10-07 DIAGNOSIS — Z79891 Long term (current) use of opiate analgesic: Secondary | ICD-10-CM

## 2021-10-07 DIAGNOSIS — G894 Chronic pain syndrome: Secondary | ICD-10-CM | POA: Diagnosis present

## 2021-10-07 DIAGNOSIS — M545 Low back pain, unspecified: Secondary | ICD-10-CM | POA: Diagnosis present

## 2021-10-07 DIAGNOSIS — M47816 Spondylosis without myelopathy or radiculopathy, lumbar region: Secondary | ICD-10-CM | POA: Diagnosis present

## 2021-10-07 DIAGNOSIS — M797 Fibromyalgia: Secondary | ICD-10-CM

## 2021-10-07 DIAGNOSIS — G8929 Other chronic pain: Secondary | ICD-10-CM | POA: Diagnosis present

## 2021-10-07 DIAGNOSIS — F0781 Postconcussional syndrome: Secondary | ICD-10-CM

## 2021-10-07 MED ORDER — METHYLPHENIDATE HCL 10 MG PO TABS: 10.0000 mg | ORAL_TABLET | Freq: Two times a day (BID) | ORAL | 0 refills | Status: DC

## 2021-10-07 MED ORDER — HYDROCODONE-ACETAMINOPHEN 10-325 MG PO TABS: 1.0000 | ORAL_TABLET | Freq: Four times a day (QID) | ORAL | 0 refills | Status: DC | PRN

## 2021-10-07 NOTE — Progress Notes (Signed)
Subjective:    Patient ID: [name], female    DOB: 16-Jan-1956, 65 y.o.   MRN: [id number]  HPI: [name] is a 65 y.o. female who returns for follow up appointment for chronic pain and medication refill.She states her pain is located in her lower back. She rates her pain 5. Her current exercise regime is walking and performing stretching exercises.   [name] Morphine equivalent is 40.00 MME.   Last Oral Swab was Performed on 07/10/2021, it was consistent.    Pain Inventory Average Pain 5 Pain Right Now 5 My pain is intermittent, dull, and aching  In the last 24 hours, has pain interfered with the following? General activity 7 Relation with others 7 Enjoyment of life 7 What TIME of day is your pain at its worst? daytime Sleep (in general) Good  Pain is worse with: walking, bending, sitting, standing, and some activites Pain improves with: rest, medication, and heat, prayer Relief from Meds: 8  Family History  Problem Relation Age of Onset   Cervical cancer Mother    Diabetes Mother    Stroke Mother    Alzheimer's disease Mother    Heart attack Father    Stroke Father    Colon cancer Father    Anxiety disorder Maternal Aunt    Depression Maternal Aunt    Anxiety disorder Maternal Uncle    Depression Maternal Uncle        suicide attempt by gun shot wound to head. Survived   Alcohol abuse Other    Drug abuse Other    Breast cancer Maternal Grandmother    Diabetes Maternal Grandmother    Breast cancer Paternal Aunt    Colon polyps Sister        x 2   Social History   Socioeconomic History   Marital status: Married    Spouse name: Not on file   Number of children: 0   Years of education: Not on file   Highest education level: Not on file  Occupational History   Occupation: disabled  Tobacco Use   Smoking status: Never   Smokeless tobacco: Never  Vaping Use   Vaping Use: Never used  Substance and Sexual Activity   Alcohol use: No     Alcohol/week: 0.0 standard drinks   Drug use: No   Sexual activity: Yes  Other Topics Concern   Not on file  Social History Narrative   Pt has h.s. degree   Social Determinants of Health   Financial Resource Strain: Not on file  Food Insecurity: Not on file  Transportation Needs: Not on file  Physical Activity: Not on file  Stress: Not on file  Social Connections: Not on file   Past Surgical History:  Procedure Laterality Date   ABDOMINAL HYSTERECTOMY  1999   bunions removed  1988   C4 C5 cage insertion  06/28/2013   CHOLECYSTECTOMY  04/25/2008   EDG  12/17/2004   ELECTROCARDIOGRAM  05/27/2007   L5 S1 rod insertion  11/07/2012   SKIN CANCER EXCISION     SKIN CANCER EXCISION     several   [place]   Past Surgical History:  Procedure Laterality Date   ABDOMINAL HYSTERECTOMY  1999   bunions removed  1988   C4 C5 cage insertion  06/28/2013   CHOLECYSTECTOMY  04/25/2008   EDG  12/17/2004   ELECTROCARDIOGRAM  05/27/2007   L5 S1 rod insertion  11/07/2012  SKIN CANCER EXCISION     SKIN CANCER EXCISION     several   TONSILLECTOMY  1981   TUBAL LIGATION  1997   Past Medical History:  Diagnosis Date   Anemia    ANXIETY 06/10/2007   Cataract    Chronic headaches    Degenerative arthritis of spine    back and neck   Depression    DYSLIPIDEMIA 06/28/2008   Esophageal stricture    Fibromyalgia    Gallstones    GERD 06/10/2007   Hiatal hernia    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   HYPERTENSION 06/10/2007   Irritable bowel syndrome 06/28/2008   OSTEOARTHRITIS 06/10/2007   Skin cancer    basal cell   Tubular adenoma of colon    BP 140/82    Pulse (!) 55    Temp 99.1 F (37.3 C)    Ht 5\' 1"  (1.549 m)    Wt 170 lb (77.1 kg)    SpO2 98%    BMI 32.12 kg/m   Opioid Risk Score:   Fall Risk Score:  `1  Depression screen PHQ 2/9  Depression screen [facility] 2/9 09/01/2021 06/04/2021 04/30/2021 03/05/2021 01/09/2021 11/12/2020 10/15/2020  Decreased Interest 0 1 1 0 1 0 1   Down, Depressed, Hopeless 0 1 1 0 1 0 1  PHQ - 2 Score 0 2 2 0 2 0 2  Altered sleeping - - - - - - -  Tired, decreased energy - - - - - - -  Change in appetite - - - - - - -  Feeling bad or failure about yourself  - - - - - - -  Trouble concentrating - - - - - - -  Moving slowly or fidgety/restless - - - - - - -  Suicidal thoughts - - - - - - -  PHQ-9 Score - - - - - - -  Difficult doing work/chores - - - - - - -  Some recent data might be hidden    Review of Systems  Musculoskeletal:  Positive for back pain.       Left upper back pain  All other systems reviewed and are negative.     Objective:   Physical Exam Vitals and nursing note reviewed.  Constitutional:      Appearance: Normal appearance.  Cardiovascular:     Rate and Rhythm: Normal rate and regular rhythm.     Pulses: Normal pulses.     Heart sounds: Normal heart sounds.  Pulmonary:     Effort: Pulmonary effort is normal.     Breath sounds: Normal breath sounds.  Musculoskeletal:     Cervical back: Normal range of motion and neck supple.     Comments: Normal Muscle Bulk and Muscle Testing Reveals:  Upper Extremities: Full ROM and Muscle Strength 5/5  Lumbar Paraspinal Tenderness: L-4-L-5 Lower Extremities: Full ROM and Muscle Strength 5/5 Arises from Chair with ease Narrow Based  Gait     Skin:    General: Skin is warm and dry.  Neurological:     Mental Status: She is alert and oriented to person, place, and time.  Psychiatric:        Mood and Affect: Mood normal.        Behavior: Behavior normal.         Assessment & Plan:  1. History of chronic lumbar facet disease with spondylosis/DDD/ L4-5 spondylolisthesis.With L4-5 L5-S1 decompression stabilization:  She underwent Posterior Lumbar Iinterbody Fusion - Lumabr three-Lumbar four, on  08/01/2021 by Dr Ronnald Ramp. Neurosurgery Following 10/07/2021 Refilled:Hydrocodone 10/325mg  one tablet 4 times a day  as needed for pain #120.  We will continue the opioid  monitoring program, this consists of regular clinic visits, examinations, urine drug screen, pill counts as well as use of [place] Controlled Substance Reporting system. A 12 month History has been reviewed on the [place] on 10/07/2021. 2. Depression with anxiety/ PTSD:  Dr. Sima Matas Following.Continue Xanax,Abilify and Effexor. 10/07/2021 3. Cervical spondylosis: Stable at this time with no complaints. Continue Medication Regime. 10/07/2021 4. 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 Ritalin: Refilled: Ritalin10 mg  One tablet at Breakfast and Lunch. # 34. Continue with  slow weaning.  12/272022. 5. Muscle Spasm: Continue current medication regimen with  Flexeril: May take 1-2 tablets at HS. 10/07/2021 6. Left Lumbar Radiculitis: No complaints today. Continue HEP as Tolerated . Continue to Monitor. 10/07/2021. 7. Left Knee Osteoarthritis: No complaints today. S/P Left Knee Injection on 10/26/2018 with good relief noted.10/07/2021 8. Right Knee Pain: No complaints today. Continue to alternate with ice and heat therapy. Continue to monitor.  10/07/2021 9. Bilateral Greater Trochanter Tenderness: No complaints today. Continue to alternate Ice and Heat therapy. Continue to monitor. 10/07/2021     F/U in 1 month

## 2021-10-09 ENCOUNTER — Ambulatory Visit: Payer: BC Managed Care – PPO | Admitting: Psychology

## 2021-10-30 ENCOUNTER — Encounter: Payer: BC Managed Care – PPO | Attending: Physical Medicine and Rehabilitation | Admitting: Registered Nurse

## 2021-10-30 ENCOUNTER — Other Ambulatory Visit: Payer: Self-pay

## 2021-10-30 ENCOUNTER — Encounter: Payer: Self-pay | Admitting: Registered Nurse

## 2021-10-30 VITALS — BP 135/79 | HR 57 | Ht 61.0 in | Wt 166.0 lb

## 2021-10-30 DIAGNOSIS — M961 Postlaminectomy syndrome, not elsewhere classified: Secondary | ICD-10-CM | POA: Insufficient documentation

## 2021-10-30 DIAGNOSIS — G894 Chronic pain syndrome: Secondary | ICD-10-CM | POA: Diagnosis present

## 2021-10-30 DIAGNOSIS — M5416 Radiculopathy, lumbar region: Secondary | ICD-10-CM | POA: Insufficient documentation

## 2021-10-30 DIAGNOSIS — Z5181 Encounter for therapeutic drug level monitoring: Secondary | ICD-10-CM | POA: Diagnosis present

## 2021-10-30 DIAGNOSIS — F0781 Postconcussional syndrome: Secondary | ICD-10-CM | POA: Insufficient documentation

## 2021-10-30 DIAGNOSIS — M797 Fibromyalgia: Secondary | ICD-10-CM | POA: Insufficient documentation

## 2021-10-30 DIAGNOSIS — M47816 Spondylosis without myelopathy or radiculopathy, lumbar region: Secondary | ICD-10-CM | POA: Insufficient documentation

## 2021-10-30 DIAGNOSIS — Z79891 Long term (current) use of opiate analgesic: Secondary | ICD-10-CM | POA: Insufficient documentation

## 2021-10-30 MED ORDER — HYDROCODONE-ACETAMINOPHEN 10-325 MG PO TABS: 1.0000 | ORAL_TABLET | Freq: Four times a day (QID) | ORAL | 0 refills | Status: DC | PRN

## 2021-10-30 MED ORDER — METHYLPHENIDATE HCL 10 MG PO TABS: 10.0000 mg | ORAL_TABLET | Freq: Two times a day (BID) | ORAL | 0 refills | Status: DC

## 2021-10-30 NOTE — Progress Notes (Signed)
Subjective:    Patient ID: [name], female    DOB: February 04, 1956, 66 y.o.   MRN: [id number]  HPI: [name] is a 66 y.o. female who returns for follow up appointment for chronic pain and medication refill. She states her pain is located in her lower back radiating into her right hip. She rates her pain 7. Her current exercise regime is walking and performing stretching exercises.  Ms. [place] Morphine equivalent is 40.00 MME.   Oral Swab was Performed Today.     Pain Inventory Average Pain 7 Pain Right Now 7 My pain is sharp, dull, and aching  In the last 24 hours, has pain interfered with the following? General activity 7 Relation with others 10 Enjoyment of life 10 What TIME of day is your pain at its worst? varies Sleep (in general) Fair  Pain is worse with: bending, standing, and some activites Pain improves with: rest, heat/ice, pacing activities, and medication Relief from Meds: 7  Family History  Problem Relation Age of Onset   Cervical cancer Mother    Diabetes Mother    Stroke Mother    Alzheimer's disease Mother    Heart attack Father    Stroke Father    Colon cancer Father    Anxiety disorder Maternal Aunt    Depression Maternal Aunt    Anxiety disorder Maternal Uncle    Depression Maternal Uncle        suicide attempt by gun shot wound to head. Survived   Alcohol abuse Other    Drug abuse Other    Breast cancer Maternal Grandmother    Diabetes Maternal Grandmother    Breast cancer Paternal Aunt    Colon polyps Sister        x 2   Social History   Socioeconomic History   Marital status: Married    Spouse name: Not on file   Number of children: 0   Years of education: Not on file   Highest education level: Not on file  Occupational History   Occupation: disabled  Tobacco Use   Smoking status: Never   Smokeless tobacco: Never  Vaping Use   Vaping Use: Never used  Substance and Sexual Activity   Alcohol use: No    Alcohol/week:  0.0 standard drinks   Drug use: No   Sexual activity: Yes  Other Topics Concern   Not on file  Social History Narrative   Pt has h.s. degree   Social Determinants of Health   Financial Resource Strain: Not on file  Food Insecurity: Not on file  Transportation Needs: Not on file  Physical Activity: Not on file  Stress: Not on file  Social Connections: Not on file   Past Surgical History:  Procedure Laterality Date   ABDOMINAL HYSTERECTOMY  1999   bunions removed  1988   C4 C5 cage insertion  06/28/2013   CHOLECYSTECTOMY  04/25/2008   EDG  12/17/2004   ELECTROCARDIOGRAM  05/27/2007   L5 S1 rod insertion  11/07/2012   SKIN CANCER EXCISION     SKIN CANCER EXCISION     several   [place]   Past Surgical History:  Procedure Laterality Date   ABDOMINAL HYSTERECTOMY  1999   bunions removed  1988   C4 C5 cage insertion  06/28/2013   CHOLECYSTECTOMY  04/25/2008   EDG  12/17/2004   ELECTROCARDIOGRAM  05/27/2007   L5 S1 rod insertion  11/07/2012  SKIN CANCER EXCISION     SKIN CANCER EXCISION     several   TONSILLECTOMY  1981   TUBAL LIGATION  1997   Past Medical History:  Diagnosis Date   Anemia    ANXIETY 06/10/2007   Cataract    Chronic headaches    Degenerative arthritis of spine    back and neck   Depression    DYSLIPIDEMIA 06/28/2008   Esophageal stricture    Fibromyalgia    Gallstones    GERD 06/10/2007   Hiatal hernia    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   HYPERTENSION 06/10/2007   Irritable bowel syndrome 06/28/2008   OSTEOARTHRITIS 06/10/2007   Skin cancer    basal cell   Tubular adenoma of colon    There were no vitals taken for this visit.  Opioid Risk Score:   Fall Risk Score:  `1  Depression screen PHQ 2/9  Depression screen [facility] 2/9 10/07/2021 09/01/2021 06/04/2021 04/30/2021 03/05/2021 01/09/2021 11/12/2020  Decreased Interest 1 0 1 1 0 1 0  Down, Depressed, Hopeless 1 0 1 1 0 1 0  PHQ - 2 Score 2 0 2 2 0 2 0  Altered sleeping - - - - -  - -  Tired, decreased energy - - - - - - -  Change in appetite - - - - - - -  Feeling bad or failure about yourself  - - - - - - -  Trouble concentrating - - - - - - -  Moving slowly or fidgety/restless - - - - - - -  Suicidal thoughts - - - - - - -  PHQ-9 Score - - - - - - -  Difficult doing work/chores - - - - - - -  Some recent data might be hidden     Review of Systems  Musculoskeletal:  Positive for back pain.  All other systems reviewed and are negative.     Objective:   Physical Exam Vitals and nursing note reviewed.  Constitutional:      Appearance: Normal appearance.  Cardiovascular:     Rate and Rhythm: Normal rate and regular rhythm.     Pulses: Normal pulses.     Heart sounds: Normal heart sounds.  Pulmonary:     Effort: Pulmonary effort is normal.     Breath sounds: Normal breath sounds.  Musculoskeletal:     Cervical back: Normal range of motion and neck supple.     Comments: Normal Muscle Bulk and Muscle Testing Reveals:  Upper Extremities: Full ROM and Muscle Strength 5/5  Lumbar Paraspinal Tenderness: L-4- L-5 Lower Extremities: Full ROM and Muscle Strength 5/5  Arises from chair  Narrow Based  Gait     Skin:    General: Skin is warm and dry.  Neurological:     Mental Status: She is alert and oriented to person, place, and time.  Psychiatric:        Mood and Affect: Mood normal.        Behavior: Behavior normal.         Assessment & Plan:  1. History of chronic lumbar facet disease with spondylosis/DDD/ L4-5 spondylolisthesis.With L4-5 L5-S1 decompression stabilization:  She underwent Posterior Lumbar Iinterbody Fusion - Lumabr three-Lumbar four, on 08/01/2021 by Dr Ronnald Ramp. Neurosurgery Following 10/30/2021 Refilled:Hydrocodone 10/325mg  one tablet 4 times a day  as needed for pain #120.  We will continue the opioid monitoring program, this consists of regular clinic visits, examinations, urine drug screen, pill counts as well as  use of [place] Controlled Substance Reporting system. A 12 month History has been reviewed on the [place] on 10/30/2021. 2. Depression with anxiety/ PTSD:  Dr. Sima Matas Following.Continue Xanax,Abilify and Effexor. 10/30/2021 3. Cervical spondylosis: Stable at this time with no complaints. Continue Medication Regime. 10/30/2021 4. 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 Ritalin: Refilled: Ritalin10 mg  One tablet at Breakfast and Lunch. # 23. Continue with  slow weaning.  01/192023. 5. Muscle Spasm: Continue current medication regimen with  Flexeril: May take 1-2 tablets at HS. 10/30/2021 6. Left Lumbar Radiculitis: No complaints today. Continue HEP as Tolerated . Continue to Monitor. 10/30/2021. 7. Left Knee Osteoarthritis: No complaints today. S/P Left Knee Injection on 10/26/2018 with good relief noted.10/30/2021 8. Right Knee Pain: No complaints today. Continue to alternate with ice and heat therapy. Continue to monitor.  10/30/2021 9. Bilateral Greater Trochanter Tenderness: No complaints today. Continue to alternate Ice and Heat therapy. Continue to monitor. 10/30/2021     F/U in 1 month

## 2021-11-05 ENCOUNTER — Telehealth: Payer: Self-pay

## 2021-11-05 MED ORDER — HYDROCODONE-ACETAMINOPHEN 10-325 MG PO TABS: 1.0000 | ORAL_TABLET | Freq: Four times a day (QID) | ORAL | 0 refills | Status: DC | PRN

## 2021-11-05 NOTE — Telephone Encounter (Signed)
PMP was Reviewed.  Hydrocodone e-scribed today.  Placed a call to Ms. Massaro, she verbalizes understanding.

## 2021-11-05 NOTE — Telephone Encounter (Signed)
Patient called stating Hydrocodone/APAP 10/325  mg was on back order at [facility]. I called [facility] and they state they have plenty. Called [facility] and cancelled prescriptions. Can you send a new prescription to [facility]?

## 2021-11-06 LAB — DRUG TOX MONITOR 1 W/CONF, ORAL FLD
Alprazolam: 1.25 ng/mL — ABNORMAL HIGH (ref <0.50)
Amphetamines: NEGATIVE ng/mL (ref <10)
Barbiturates: NEGATIVE ng/mL (ref <10)
Benzodiazepines: POSITIVE ng/mL — AB (ref <0.50)
Buprenorphine: NEGATIVE ng/mL (ref <0.10)
Chlordiazepoxide: NEGATIVE ng/mL (ref <0.50)
Clonazepam: NEGATIVE ng/mL (ref <0.50)
Cocaine: NEGATIVE ng/mL (ref <5.0)
Codeine: NEGATIVE ng/mL (ref <2.5)
Diazepam: NEGATIVE ng/mL (ref <0.50)
Dihydrocodeine: 8.2 ng/mL — ABNORMAL HIGH (ref <2.5)
Fentanyl: NEGATIVE ng/mL (ref <0.10)
Flunitrazepam: NEGATIVE ng/mL (ref <0.50)
Flurazepam: NEGATIVE ng/mL (ref <0.50)
Heroin Metabolite: NEGATIVE ng/mL (ref <1.0)
Hydrocodone: 190.2 ng/mL — ABNORMAL HIGH (ref <2.5)
Hydromorphone: NEGATIVE ng/mL (ref <2.5)
Lorazepam: NEGATIVE ng/mL (ref <0.50)
MARIJUANA: NEGATIVE ng/mL (ref <2.5)
MDMA: NEGATIVE ng/mL (ref <10)
Meprobamate: NEGATIVE ng/mL (ref <2.5)
Methadone: NEGATIVE ng/mL (ref <5.0)
Midazolam: NEGATIVE ng/mL (ref <0.50)
Morphine: NEGATIVE ng/mL (ref <2.5)
Nicotine Metabolite: NEGATIVE ng/mL (ref <5.0)
Nordiazepam: NEGATIVE ng/mL (ref <0.50)
Norhydrocodone: 5.6 ng/mL — ABNORMAL HIGH (ref <2.5)
Noroxycodone: NEGATIVE ng/mL (ref <2.5)
Opiates: POSITIVE ng/mL — AB (ref <2.5)
Oxazepam: NEGATIVE ng/mL (ref <0.50)
Oxycodone: NEGATIVE ng/mL (ref <2.5)
Oxymorphone: NEGATIVE ng/mL (ref <2.5)
Phencyclidine: NEGATIVE ng/mL (ref <10)
Tapentadol: NEGATIVE ng/mL (ref <5.0)
Temazepam: NEGATIVE ng/mL (ref <0.50)
Tramadol: NEGATIVE ng/mL (ref <5.0)
Triazolam: NEGATIVE ng/mL (ref <0.50)
Zolpidem: NEGATIVE ng/mL (ref <5.0)

## 2021-11-06 LAB — DRUG TOX METHYLPHEN W/CONF,ORAL FLD: Methylphenidate: NEGATIVE ng/mL (ref <1.0)

## 2021-11-06 LAB — DRUG TOX ALC METAB W/CON, ORAL FLD: Alcohol Metabolite: NEGATIVE ng/mL (ref <25)

## 2021-11-11 ENCOUNTER — Telehealth: Payer: Self-pay | Admitting: *Deleted

## 2021-11-11 NOTE — Telephone Encounter (Signed)
Oral swab drug screen was consistent for prescribed medications. Methylphenidate did not show even though she reported taking it and had pills present at [place].

## 2021-11-18 ENCOUNTER — Other Ambulatory Visit: Payer: Self-pay | Admitting: Registered Nurse

## 2021-12-04 ENCOUNTER — Encounter: Payer: BC Managed Care – PPO | Attending: Physical Medicine and Rehabilitation | Admitting: Registered Nurse

## 2021-12-04 ENCOUNTER — Other Ambulatory Visit: Payer: Self-pay

## 2021-12-04 ENCOUNTER — Encounter: Payer: Self-pay | Admitting: Registered Nurse

## 2021-12-04 VITALS — BP 147/83 | HR 61 | Temp 99.3°F | Ht 61.0 in | Wt 166.8 lb

## 2021-12-04 DIAGNOSIS — G8929 Other chronic pain: Secondary | ICD-10-CM

## 2021-12-04 DIAGNOSIS — M5136 Other intervertebral disc degeneration, lumbar region: Secondary | ICD-10-CM | POA: Diagnosis not present

## 2021-12-04 DIAGNOSIS — M797 Fibromyalgia: Secondary | ICD-10-CM | POA: Diagnosis not present

## 2021-12-04 DIAGNOSIS — M1712 Unilateral primary osteoarthritis, left knee: Secondary | ICD-10-CM | POA: Diagnosis not present

## 2021-12-04 DIAGNOSIS — M47816 Spondylosis without myelopathy or radiculopathy, lumbar region: Secondary | ICD-10-CM | POA: Insufficient documentation

## 2021-12-04 DIAGNOSIS — F0781 Postconcussional syndrome: Secondary | ICD-10-CM | POA: Diagnosis not present

## 2021-12-04 DIAGNOSIS — M961 Postlaminectomy syndrome, not elsewhere classified: Secondary | ICD-10-CM | POA: Diagnosis not present

## 2021-12-04 DIAGNOSIS — F4310 Post-traumatic stress disorder, unspecified: Secondary | ICD-10-CM | POA: Diagnosis not present

## 2021-12-04 DIAGNOSIS — M47817 Spondylosis without myelopathy or radiculopathy, lumbosacral region: Secondary | ICD-10-CM | POA: Diagnosis not present

## 2021-12-04 DIAGNOSIS — M25561 Pain in right knee: Secondary | ICD-10-CM | POA: Insufficient documentation

## 2021-12-04 DIAGNOSIS — M5416 Radiculopathy, lumbar region: Secondary | ICD-10-CM | POA: Diagnosis not present

## 2021-12-04 DIAGNOSIS — M47812 Spondylosis without myelopathy or radiculopathy, cervical region: Secondary | ICD-10-CM | POA: Insufficient documentation

## 2021-12-04 DIAGNOSIS — X58XXXD Exposure to other specified factors, subsequent encounter: Secondary | ICD-10-CM | POA: Diagnosis not present

## 2021-12-04 DIAGNOSIS — G894 Chronic pain syndrome: Secondary | ICD-10-CM | POA: Insufficient documentation

## 2021-12-04 DIAGNOSIS — M545 Low back pain, unspecified: Secondary | ICD-10-CM | POA: Insufficient documentation

## 2021-12-04 DIAGNOSIS — Z79899 Other long term (current) drug therapy: Secondary | ICD-10-CM | POA: Diagnosis not present

## 2021-12-04 DIAGNOSIS — Z79891 Long term (current) use of opiate analgesic: Secondary | ICD-10-CM | POA: Insufficient documentation

## 2021-12-04 DIAGNOSIS — Z760 Encounter for issue of repeat prescription: Secondary | ICD-10-CM | POA: Insufficient documentation

## 2021-12-04 DIAGNOSIS — F32A Depression, unspecified: Secondary | ICD-10-CM | POA: Diagnosis not present

## 2021-12-04 DIAGNOSIS — W06XXXD Fall from bed, subsequent encounter: Secondary | ICD-10-CM | POA: Diagnosis not present

## 2021-12-04 DIAGNOSIS — M62838 Other muscle spasm: Secondary | ICD-10-CM | POA: Insufficient documentation

## 2021-12-04 MED ORDER — HYDROCODONE-ACETAMINOPHEN 10-325 MG PO TABS: 1.0000 | ORAL_TABLET | Freq: Four times a day (QID) | ORAL | 0 refills | Status: DC | PRN

## 2021-12-04 MED ORDER — ALPRAZOLAM 1 MG PO TABS: ORAL_TABLET | ORAL | 3 refills | Status: DC

## 2021-12-04 MED ORDER — METHYLPHENIDATE HCL 10 MG PO TABS: 10.0000 mg | ORAL_TABLET | Freq: Two times a day (BID) | ORAL | 0 refills | Status: DC

## 2021-12-04 NOTE — Progress Notes (Signed)
Subjective:    Patient ID: [name], female    DOB: 03/30/1956, 66 y.o.   MRN: [id number]  HPI: [name] is a 66 y.o. female who returns for follow up appointment for chronic pain and medication refill. She states her pain is located in her lower back. She rates her pain 8. Her current exercise regime is walking and performing stretching exercises.  Ms. Heinzelman states a week ago she was sleeping and fell out of bed and landed on her stomach, she was able to get up. She states she call Dr Ronnald Ramp and had a scheduled appointment.   Ms. [place] Morphine equivalent is 40.00 MME.   Last oral swab was performed on 10/30/2021, it was consistent.     Pain Inventory Average Pain 7 Pain Right Now 8 My pain is sharp, dull, stabbing, and aching  In the last 24 hours, has pain interfered with the following? General activity 10 Relation with others 10 Enjoyment of life 10 What TIME of day is your pain at its worst? varies Sleep (in general) Poor  Pain is worse with: walking, bending, sitting, standing, and some activites Pain improves with: rest, heat/ice, pacing activities, and medication Relief from Meds: 8  Family History  Problem Relation Age of Onset   Cervical cancer Mother    Diabetes Mother    Stroke Mother    Alzheimer's disease Mother    Heart attack Father    Stroke Father    Colon cancer Father    Anxiety disorder Maternal Aunt    Depression Maternal Aunt    Anxiety disorder Maternal Uncle    Depression Maternal Uncle        suicide attempt by gun shot wound to head. Survived   Alcohol abuse Other    Drug abuse Other    Breast cancer Maternal Grandmother    Diabetes Maternal Grandmother    Breast cancer Paternal Aunt    Colon polyps Sister        x 2   Social History   Socioeconomic History   Marital status: Married    Spouse name: Not on file   Number of children: 0   Years of education: Not on file   Highest education level: Not on file   Occupational History   Occupation: disabled  Tobacco Use   Smoking status: Never   Smokeless tobacco: Never  Vaping Use   Vaping Use: Never used  Substance and Sexual Activity   Alcohol use: No    Alcohol/week: 0.0 standard drinks   Drug use: No   Sexual activity: Yes  Other Topics Concern   Not on file  Social History Narrative   Pt has h.s. degree   Social Determinants of Health   Financial Resource Strain: Not on file  Food Insecurity: Not on file  Transportation Needs: Not on file  Physical Activity: Not on file  Stress: Not on file  Social Connections: Not on file   Past Surgical History:  Procedure Laterality Date   ABDOMINAL HYSTERECTOMY  1999   bunions removed  1988   C4 C5 cage insertion  06/28/2013   CHOLECYSTECTOMY  04/25/2008   EDG  12/17/2004   ELECTROCARDIOGRAM  05/27/2007   L5 S1 rod insertion  11/07/2012   SKIN CANCER EXCISION     SKIN CANCER EXCISION     several   [place]   Past Surgical History:  Procedure Laterality Date   ABDOMINAL  HYSTERECTOMY  1999   bunions removed  1988   C4 C5 cage insertion  06/28/2013   CHOLECYSTECTOMY  04/25/2008   EDG  12/17/2004   ELECTROCARDIOGRAM  05/27/2007   L5 S1 rod insertion  11/07/2012   SKIN CANCER EXCISION     SKIN CANCER EXCISION     several   TONSILLECTOMY  1981   TUBAL LIGATION  1997   Past Medical History:  Diagnosis Date   Anemia    ANXIETY 06/10/2007   Cataract    Chronic headaches    Degenerative arthritis of spine    back and neck   Depression    DYSLIPIDEMIA 06/28/2008   Esophageal stricture    Fibromyalgia    Gallstones    GERD 06/10/2007   Hiatal hernia    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   HYPERTENSION 06/10/2007   Irritable bowel syndrome 06/28/2008   OSTEOARTHRITIS 06/10/2007   Skin cancer    basal cell   Tubular adenoma of colon    BP (!) 147/83    Pulse 61    Temp 99.3 F (37.4 C)    Ht 5\' 1"  (1.549 m)    Wt 166 lb 12.8 oz (75.7 kg)    SpO2 95%    BMI 31.52  kg/m   Opioid Risk Score:   Fall Risk Score:  `1  Depression screen PHQ 2/9  Depression screen [facility] 2/9 12/04/2021 10/07/2021 09/01/2021 06/04/2021 04/30/2021 03/05/2021 01/09/2021  Decreased Interest 0 1 0 1 1 0 1  Down, Depressed, Hopeless 0 1 0 1 1 0 1  PHQ - 2 Score 0 2 0 2 2 0 2  Altered sleeping - - - - - - -  Tired, decreased energy - - - - - - -  Change in appetite - - - - - - -  Feeling bad or failure about yourself  - - - - - - -  Trouble concentrating - - - - - - -  Moving slowly or fidgety/restless - - - - - - -  Suicidal thoughts - - - - - - -  PHQ-9 Score - - - - - - -  Difficult doing work/chores - - - - - - -  Some recent data might be hidden     Review of Systems  Constitutional: Negative.   HENT: Negative.    Eyes: Negative.   Respiratory: Negative.    Cardiovascular: Negative.   Gastrointestinal: Negative.   Endocrine: Negative.   Genitourinary: Negative.   Musculoskeletal:  Positive for back pain.  Skin: Negative.   Allergic/Immunologic: Negative.   Neurological: Negative.   Hematological: Negative.   Psychiatric/Behavioral:  Positive for sleep disturbance.       Objective:   Physical Exam Vitals and nursing note reviewed.  Constitutional:      Appearance: Normal appearance.  Cardiovascular:     Rate and Rhythm: Normal rate and regular rhythm.     Pulses: Normal pulses.     Heart sounds: Normal heart sounds.  Pulmonary:     Effort: Pulmonary effort is normal.     Breath sounds: Normal breath sounds.  Musculoskeletal:     Cervical back: Normal range of motion and neck supple.     Comments: Normal Muscle Bulk and Muscle Testing Reveals:  Upper Extremities: Full ROM and Muscle Strength 5/5 Lumbar Paraspinal Tenderness: L-4-L-5 Lower Extremities: Full ROM and Muscle Strength 5/5 Arises from chair with ease Narrow Based  Gait     Skin:    General: Skin  is warm and dry.  Neurological:     Mental Status: She is alert and oriented to person,  place, and time.  Psychiatric:        Mood and Affect: Mood normal.        Behavior: Behavior normal.         Assessment & Plan:  1. History of chronic lumbar facet disease with spondylosis/DDD/ L4-5 spondylolisthesis.With L4-5 L5-S1 decompression stabilization:  She underwent Posterior Lumbar Iinterbody Fusion - Lumabr three-Lumbar four, on 08/01/2021 by Dr Ronnald Ramp. Neurosurgery Following 12/04/2021 Refilled:Hydrocodone 10/325mg  one tablet 4 times a day  as needed for pain #120.  We will continue the opioid monitoring program, this consists of regular clinic visits, examinations, urine drug screen, pill counts as well as use of [place] Controlled Substance Reporting system. A 12 month History has been reviewed on the [place] on 12/04/2021. 2. Depression with anxiety/ PTSD:  Dr. Sima Matas Following.Continue Xanax,Abilify and Effexor. 12/04/2021 3. Cervical spondylosis: Stable at this time with no complaints. Continue Medication Regime. 12/04/2021 4. 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 Ritalin: Refilled: Ritalin10 mg  One tablet at Breakfast and Lunch. # 42. Continue with  slow weaning.  02/232023. 5. Muscle Spasm: Continue current medication regimen with  Flexeril: May take 1-2 tablets at HS. 12/04/2021 6. Left Lumbar Radiculitis: No complaints today. Continue HEP as Tolerated . Continue to Monitor. 12/04/2021. 7. Left Knee Osteoarthritis: No complaints today. S/P Left Knee Injection on 10/26/2018 with good relief noted.12/04/2021 8. Right Knee Pain: No complaints today. Continue to alternate with ice and heat therapy. Continue to monitor.  12/04/2021 9. Bilateral Greater Trochanter Tenderness: No complaints today. Continue to alternate Ice and Heat therapy. Continue to monitor. 12/04/2021     F/U in 1 month

## 2021-12-08 ENCOUNTER — Other Ambulatory Visit: Payer: Self-pay

## 2021-12-08 ENCOUNTER — Encounter: Payer: BC Managed Care – PPO | Admitting: Psychology

## 2021-12-08 DIAGNOSIS — M47816 Spondylosis without myelopathy or radiculopathy, lumbar region: Secondary | ICD-10-CM

## 2021-12-08 DIAGNOSIS — M961 Postlaminectomy syndrome, not elsewhere classified: Secondary | ICD-10-CM

## 2021-12-08 DIAGNOSIS — Z760 Encounter for issue of repeat prescription: Secondary | ICD-10-CM | POA: Diagnosis not present

## 2021-12-08 DIAGNOSIS — F0781 Postconcussional syndrome: Secondary | ICD-10-CM

## 2021-12-08 DIAGNOSIS — M797 Fibromyalgia: Secondary | ICD-10-CM

## 2021-12-08 DIAGNOSIS — G894 Chronic pain syndrome: Secondary | ICD-10-CM

## 2021-12-08 NOTE — Progress Notes (Signed)
Patient:                           [name]            DOB:                               18-Jul-1956   MR Number:                  [id number]   Location:                        [facility] [name], [place] [id number] [place] [address] Dept: [phone]   Start:     2 PM End:                                 3 PM  Today's visit was an in person visit was conducted in my outpatient clinic office with the patient myself present.   Provider/Observer:                           Edgardo Roys PSYD   Chief Complaint:                                   Chief Complaint  Patient presents with   Post-Traumatic Stress Disorder   Pain   Depression   Memory Loss   Anxiety      Reason For Service:                          [name] is a 66 year old female referred by Dr. Naaman Plummer due to issues of coping and dealing with a number of stressors. She has a history of postconcussion syndrome, chronic pain syndrome as well as PTSD.  The patient has been dealing with significant anxiety and depression along with a history of PTSD and postconcussion syndrome. The patient was referred for psychotherapeutic interventions primarily because of the issues of depression and coping with family stress.  The above reason for service was reviewed and remains accurate.  The patient reports that there has been significant improvements in psychosocial issues but there continues to be a lot of stress particularly around issues related to her mother's dementia and the mother not recognizing the patient.  The above reason for service was reviewed and remains applicable for the current visit.  The patient has been having more depressive symptoms with increased psychosocial stressors including her mother at end stages of Alzheimer's Dementia.  The mother is living with patient's sister and while the mother  likely needs a SNF the sister insists on taking care of the mother and allowing the mother to be at home when she dies.  However, this results in patient having to help the sister in significant ways including cleaning the sister's house and caring for mother that has significant medical and neurological needs.    The above reason for service has been reviewed and remains applicable for the current visit.  I have not seen the patient since May of this year.  The patient's mother has  passed away since I saw her and things went relatively smoothly after the death of her mother as the older sister took over the finalization of the will and a state.  The patient continues to have conflicts with her other sister as the other sister is described as having traits consistent with significant narcissistic personality disorder.  The patient reports that she has been doing better after having surgery on her back on 10/22.  The patient reports that she was having significant improvement overall and is no longer having radiating pain.  However, she reports that recently she fell off her bed and began having localized pain around the area of the surgical site.  The patient reports that she was very concerned and has an appointment with her neurosurgeon Dr. Ronnald Ramp 2 days from now.  However, the patient does not describe any particular neurological symptoms of radiating pain to indicate significant damage but more localized pain around surgical site.  Patient has been quite anxious about having done some damage as she had been improving so much as far as her pain symptoms.  Interventions Strategy:                    Cognitive/behavioral psychotherapeutic interventions  Participation Level:                           The patient was appropriate and active during current visit with good mental status.    Participation Quality:                       Appropriate and attentive                          Behavioral Observation:                   Patient was well-groomed, alert and appropriate in her interactions throughout the visit.   Current Psychosocial Factors:        The patient reports that she has been able to distance herself from her sister for the most part and picks and chooses the times that the interact.  This helps her with coping for the most part around the stressors that her sister with significant narcissistic like descriptions (as described by the patient).   Content of Session:                           Reviewed current symptoms and continued to work on issues of depression, pain, PTSD and anxiety.  Reviewed current symptoms and continue to work on therapeutic issues around pain, PTSD, and depression/anxiety.  Current Status:                                   T the patient reports that her coping skills and strategies have continued to improve and that she is doing much better as far as her interactions.  She reports that her anxiety and depressive symptoms have been improving.  Patient Progress:                               The patient continues to show improvement in her overall functioning.  Impression/Diagnosis:  [name] is a 66 year old female referred by Dr. Naaman Plummer due to issues of coping and dealing with a number of stressors. She has a history of postconcussion syndrome, chronic pain syndrome as well as PTSD.  The patient describes significant difficulty with issues related to her family and the family estate. There were issues when her father passed away where an older sister was in charge of everything and all of his possessions were been ago to this older sister. However, the middle sister began having trouble and when the patient could not help the sister rejected the patient. There've been a number of family conflicts going on including a niece who got in trouble with doing drugs and her excuse to the patient's sister was that the patient was the one who started her doing  drugs and giving her money. This is not accurate.  The patient reports that she managed the death of her mother fairly well.  She continues to have significant pain symptoms, PTSD symptoms and postconcussive symptoms but has been managing better and actively working on therapeutic interventions we have addressed.  The patient has been doing much better after her neurosurgery for posterior lumbar fusion until a recent fall cause localized pain to develop.  Diagnosis:   Post concussion syndrome  Lumbar post-laminectomy syndrome  Lumbar facet arthropathy  Fibromyalgia  Chronic pain syndrome

## 2021-12-23 ENCOUNTER — Encounter: Payer: BC Managed Care – PPO | Admitting: Registered Nurse

## 2021-12-29 ENCOUNTER — Encounter: Payer: BC Managed Care – PPO | Attending: Physical Medicine and Rehabilitation | Admitting: Registered Nurse

## 2021-12-29 ENCOUNTER — Encounter: Payer: Self-pay | Admitting: Registered Nurse

## 2021-12-29 ENCOUNTER — Other Ambulatory Visit: Payer: Self-pay

## 2021-12-29 VITALS — BP 154/78 | HR 63 | Ht 61.0 in | Wt 168.6 lb

## 2021-12-29 DIAGNOSIS — M797 Fibromyalgia: Secondary | ICD-10-CM | POA: Insufficient documentation

## 2021-12-29 DIAGNOSIS — Z760 Encounter for issue of repeat prescription: Secondary | ICD-10-CM | POA: Diagnosis not present

## 2021-12-29 DIAGNOSIS — G894 Chronic pain syndrome: Secondary | ICD-10-CM | POA: Diagnosis not present

## 2021-12-29 DIAGNOSIS — F0781 Postconcussional syndrome: Secondary | ICD-10-CM | POA: Diagnosis not present

## 2021-12-29 DIAGNOSIS — M47812 Spondylosis without myelopathy or radiculopathy, cervical region: Secondary | ICD-10-CM | POA: Diagnosis not present

## 2021-12-29 DIAGNOSIS — M62838 Other muscle spasm: Secondary | ICD-10-CM | POA: Insufficient documentation

## 2021-12-29 DIAGNOSIS — R6889 Other general symptoms and signs: Secondary | ICD-10-CM | POA: Insufficient documentation

## 2021-12-29 DIAGNOSIS — M5416 Radiculopathy, lumbar region: Secondary | ICD-10-CM | POA: Insufficient documentation

## 2021-12-29 DIAGNOSIS — M1712 Unilateral primary osteoarthritis, left knee: Secondary | ICD-10-CM | POA: Insufficient documentation

## 2021-12-29 DIAGNOSIS — M47816 Spondylosis without myelopathy or radiculopathy, lumbar region: Secondary | ICD-10-CM

## 2021-12-29 DIAGNOSIS — Y92009 Unspecified place in unspecified non-institutional (private) residence as the place of occurrence of the external cause: Secondary | ICD-10-CM

## 2021-12-29 DIAGNOSIS — F418 Other specified anxiety disorders: Secondary | ICD-10-CM | POA: Insufficient documentation

## 2021-12-29 DIAGNOSIS — Z5181 Encounter for therapeutic drug level monitoring: Secondary | ICD-10-CM | POA: Insufficient documentation

## 2021-12-29 DIAGNOSIS — Z79891 Long term (current) use of opiate analgesic: Secondary | ICD-10-CM | POA: Insufficient documentation

## 2021-12-29 DIAGNOSIS — W19XXXD Unspecified fall, subsequent encounter: Secondary | ICD-10-CM | POA: Diagnosis not present

## 2021-12-29 DIAGNOSIS — M961 Postlaminectomy syndrome, not elsewhere classified: Secondary | ICD-10-CM | POA: Insufficient documentation

## 2021-12-29 DIAGNOSIS — F4310 Post-traumatic stress disorder, unspecified: Secondary | ICD-10-CM | POA: Insufficient documentation

## 2021-12-29 DIAGNOSIS — M5136 Other intervertebral disc degeneration, lumbar region: Secondary | ICD-10-CM | POA: Insufficient documentation

## 2021-12-29 DIAGNOSIS — G8929 Other chronic pain: Secondary | ICD-10-CM

## 2021-12-29 DIAGNOSIS — M545 Low back pain, unspecified: Secondary | ICD-10-CM | POA: Diagnosis not present

## 2021-12-29 MED ORDER — HYDROCODONE-ACETAMINOPHEN 10-325 MG PO TABS: 1.0000 | ORAL_TABLET | Freq: Four times a day (QID) | ORAL | 0 refills | Status: DC | PRN

## 2021-12-29 MED ORDER — METHYLPHENIDATE HCL 10 MG PO TABS: 10.0000 mg | ORAL_TABLET | Freq: Two times a day (BID) | ORAL | 0 refills | Status: DC
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
## 2022-01-01 ENCOUNTER — Telehealth: Payer: Self-pay | Admitting: *Deleted

## 2022-01-01 LAB — VITAMIN B12: Vitamin B-12: 398 pg/mL (ref 232–1245)

## 2022-01-01 LAB — VITAMIN B1: Thiamine: 117.1 nmol/L (ref 66.5–200.0)

## 2022-01-01 MED ORDER — HYDROCODONE-ACETAMINOPHEN 10-325 MG PO TABS: 1.0000 | ORAL_TABLET | Freq: Four times a day (QID) | ORAL | 0 refills | Status: DC | PRN

## 2022-01-01 NOTE — Telephone Encounter (Signed)
PMP was Reviewed.  ?Hydrocodone e-scribed to [facility].  ?Ms. [place] ia aware of the above.  ?

## 2022-01-01 NOTE — Telephone Encounter (Signed)
[name] needs her hydrocodone 10-325 sent to [facility] on [address].  [facility]. [facility] have [facility]. ?

## 2022-01-02 ENCOUNTER — Encounter: Payer: BC Managed Care – PPO | Admitting: Registered Nurse

## 2022-01-12 ENCOUNTER — Other Ambulatory Visit: Payer: Self-pay | Admitting: Neurological Surgery

## 2022-01-12 DIAGNOSIS — M545 Low back pain, unspecified: Secondary | ICD-10-CM

## 2022-01-19 ENCOUNTER — Encounter: Payer: BC Managed Care – PPO | Attending: Physical Medicine and Rehabilitation | Admitting: Psychology

## 2022-01-19 DIAGNOSIS — Z79891 Long term (current) use of opiate analgesic: Secondary | ICD-10-CM | POA: Insufficient documentation

## 2022-01-19 DIAGNOSIS — M5416 Radiculopathy, lumbar region: Secondary | ICD-10-CM | POA: Diagnosis not present

## 2022-01-19 DIAGNOSIS — G8929 Other chronic pain: Secondary | ICD-10-CM | POA: Diagnosis not present

## 2022-01-19 DIAGNOSIS — M1712 Unilateral primary osteoarthritis, left knee: Secondary | ICD-10-CM | POA: Diagnosis not present

## 2022-01-19 DIAGNOSIS — F32A Depression, unspecified: Secondary | ICD-10-CM | POA: Diagnosis not present

## 2022-01-19 DIAGNOSIS — M961 Postlaminectomy syndrome, not elsewhere classified: Secondary | ICD-10-CM | POA: Insufficient documentation

## 2022-01-19 DIAGNOSIS — M47812 Spondylosis without myelopathy or radiculopathy, cervical region: Secondary | ICD-10-CM | POA: Diagnosis not present

## 2022-01-19 DIAGNOSIS — G894 Chronic pain syndrome: Secondary | ICD-10-CM | POA: Insufficient documentation

## 2022-01-19 DIAGNOSIS — F0781 Postconcussional syndrome: Secondary | ICD-10-CM | POA: Insufficient documentation

## 2022-01-19 DIAGNOSIS — M545 Low back pain, unspecified: Secondary | ICD-10-CM | POA: Diagnosis present

## 2022-01-19 DIAGNOSIS — M62838 Other muscle spasm: Secondary | ICD-10-CM | POA: Insufficient documentation

## 2022-01-19 DIAGNOSIS — F4310 Post-traumatic stress disorder, unspecified: Secondary | ICD-10-CM | POA: Insufficient documentation

## 2022-01-19 DIAGNOSIS — Z5181 Encounter for therapeutic drug level monitoring: Secondary | ICD-10-CM | POA: Diagnosis not present

## 2022-01-19 DIAGNOSIS — R4189 Other symptoms and signs involving cognitive functions and awareness: Secondary | ICD-10-CM | POA: Insufficient documentation

## 2022-01-19 DIAGNOSIS — Z760 Encounter for issue of repeat prescription: Secondary | ICD-10-CM | POA: Insufficient documentation

## 2022-01-19 DIAGNOSIS — M47816 Spondylosis without myelopathy or radiculopathy, lumbar region: Secondary | ICD-10-CM | POA: Insufficient documentation

## 2022-01-19 DIAGNOSIS — M797 Fibromyalgia: Secondary | ICD-10-CM | POA: Diagnosis not present

## 2022-01-20 ENCOUNTER — Encounter: Payer: Self-pay | Admitting: Psychology
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
## 2022-01-21 ENCOUNTER — Ambulatory Visit: Payer: BC Managed Care – PPO | Admitting: Registered Nurse

## 2022-01-22 ENCOUNTER — Ambulatory Visit: Payer: BC Managed Care – PPO | Admitting: Registered Nurse

## 2022-01-26 ENCOUNTER — Other Ambulatory Visit: Payer: Self-pay | Admitting: Registered Nurse

## 2022-01-26 ENCOUNTER — Ambulatory Visit: Payer: BC Managed Care – PPO | Admitting: Registered Nurse

## 2022-01-26 ENCOUNTER — Encounter: Payer: BC Managed Care – PPO | Admitting: Registered Nurse

## 2022-01-28 ENCOUNTER — Encounter: Payer: BC Managed Care – PPO | Admitting: Registered Nurse

## 2022-01-28 VITALS — BP 120/84 | HR 61 | Ht 61.0 in | Wt 168.0 lb

## 2022-01-28 DIAGNOSIS — M47816 Spondylosis without myelopathy or radiculopathy, lumbar region: Secondary | ICD-10-CM | POA: Diagnosis not present

## 2022-01-28 DIAGNOSIS — M961 Postlaminectomy syndrome, not elsewhere classified: Secondary | ICD-10-CM | POA: Diagnosis not present

## 2022-01-28 DIAGNOSIS — F0781 Postconcussional syndrome: Secondary | ICD-10-CM | POA: Diagnosis not present

## 2022-01-28 DIAGNOSIS — G894 Chronic pain syndrome: Secondary | ICD-10-CM

## 2022-01-28 DIAGNOSIS — Z760 Encounter for issue of repeat prescription: Secondary | ICD-10-CM | POA: Diagnosis not present

## 2022-01-28 DIAGNOSIS — M797 Fibromyalgia: Secondary | ICD-10-CM

## 2022-01-28 DIAGNOSIS — M545 Low back pain, unspecified: Secondary | ICD-10-CM

## 2022-01-28 DIAGNOSIS — Z5181 Encounter for therapeutic drug level monitoring: Secondary | ICD-10-CM

## 2022-01-28 DIAGNOSIS — Z79891 Long term (current) use of opiate analgesic: Secondary | ICD-10-CM

## 2022-01-28 DIAGNOSIS — G8929 Other chronic pain: Secondary | ICD-10-CM

## 2022-01-28 MED ORDER — HYDROCODONE-ACETAMINOPHEN 10-325 MG PO TABS: 1.0000 | ORAL_TABLET | Freq: Four times a day (QID) | ORAL | 0 refills | Status: DC | PRN

## 2022-01-28 MED ORDER — METHYLPHENIDATE HCL 10 MG PO TABS: 10.0000 mg | ORAL_TABLET | Freq: Two times a day (BID) | ORAL | 0 refills | Status: DC
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
## 2022-02-02 ENCOUNTER — Encounter: Payer: Self-pay | Admitting: Registered Nurse

## 2022-02-03 ENCOUNTER — Telehealth: Payer: Self-pay | Admitting: *Deleted

## 2022-02-03 ENCOUNTER — Other Ambulatory Visit: Payer: Self-pay | Admitting: Family Medicine

## 2022-02-03 DIAGNOSIS — F0781 Postconcussional syndrome: Secondary | ICD-10-CM

## 2022-02-03 DIAGNOSIS — Z1231 Encounter for screening mammogram for malignant neoplasm of breast: Secondary | ICD-10-CM

## 2022-02-03 MED ORDER — METHYLPHENIDATE HCL 10 MG PO TABS: 10.0000 mg | ORAL_TABLET | Freq: Two times a day (BID) | ORAL | 0 refills | Status: DC

## 2022-02-03 NOTE — Telephone Encounter (Signed)
[name] called and there was something on her ritalin rx that says pharmacy cannot release before 02/10/22. (I do not see it on the rx but I spoke with pharmacy and they confirmed) You will need to send another rx. ?

## 2022-02-03 NOTE — Telephone Encounter (Signed)
PMP was Reviewed.  ?Spoke with Pharmacist  ?Methylphenidate e-scribed today. Placed a call to Ms. Matthew Saras, no answer. Left message to return the call with any questions.  ? ?

## 2022-02-04 ENCOUNTER — Telehealth: Payer: Self-pay

## 2022-02-04 ENCOUNTER — Ambulatory Visit
Admission: RE | Admit: 2022-02-04 | Discharge: 2022-02-04 | Disposition: A | Discharge status: Home / Self Care | Payer: BC Managed Care – PPO | Source: Ambulatory Visit | Attending: Family Medicine | Admitting: Family Medicine

## 2022-02-04 DIAGNOSIS — Z1231 Encounter for screening mammogram for malignant neoplasm of breast: Secondary | ICD-10-CM

## 2022-02-04 NOTE — Telephone Encounter (Signed)
Patient called stating that [facility] sent her a message stating that due to limitations she can't get Ritalin until May 2. ?

## 2022-02-11 ENCOUNTER — Ambulatory Visit
Admission: RE | Admit: 2022-02-11 | Discharge: 2022-02-11 | Disposition: A | Discharge status: Home / Self Care | Payer: BC Managed Care – PPO | Source: Ambulatory Visit | Attending: Neurological Surgery | Admitting: Neurological Surgery

## 2022-02-11 DIAGNOSIS — M545 Low back pain, unspecified: Secondary | ICD-10-CM

## 2022-02-13 ENCOUNTER — Telehealth: Payer: Self-pay

## 2022-02-13 NOTE — Telephone Encounter (Signed)
[name] CT was reviewed by Dr. Ronnald Ramp ? ?Per Dr. Ronnald Ramp he did not see anything in the CT that has separated. Or causing pain in the tailbone region. He said her pain medication need to be changed. Because she has been on the same pain medication of so long and it my not be effective.  ? ?[name] would like a call back with advice.  ?Cal back phone [phone]. ?

## 2022-02-13 NOTE — Telephone Encounter (Signed)
Return Ms. [place] call.  ?She states she received an injection from Dr Ronnald Ramp on 02/12/2022. Her pain is under control with the hydrocodone at this time. She was instructed to call office if any changes occurs, she verbalizes understanding.  ?

## 2022-02-25 IMAGING — RF DG LUMBAR SPINE 2-3V
1 series · 3 of 3 positions shown · non-contrast
Comparison: 06/24/2021

CLINICAL DATA: Posterior lumbar fusion L3-4

EXAM:
LUMBAR SPINE - 2-3 VIEW

[Series 1: run · 3 of 3 slices shown]
[im 1/3]
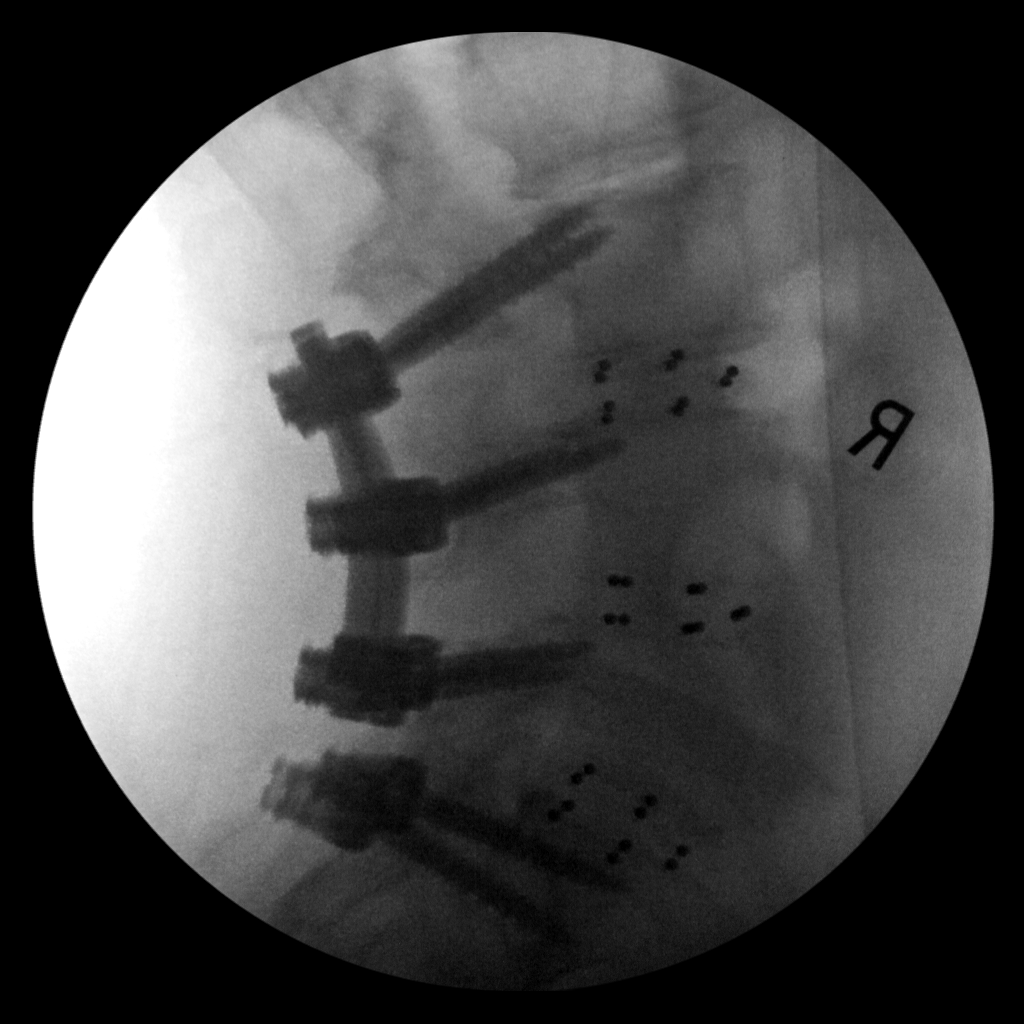
[im 2/3]
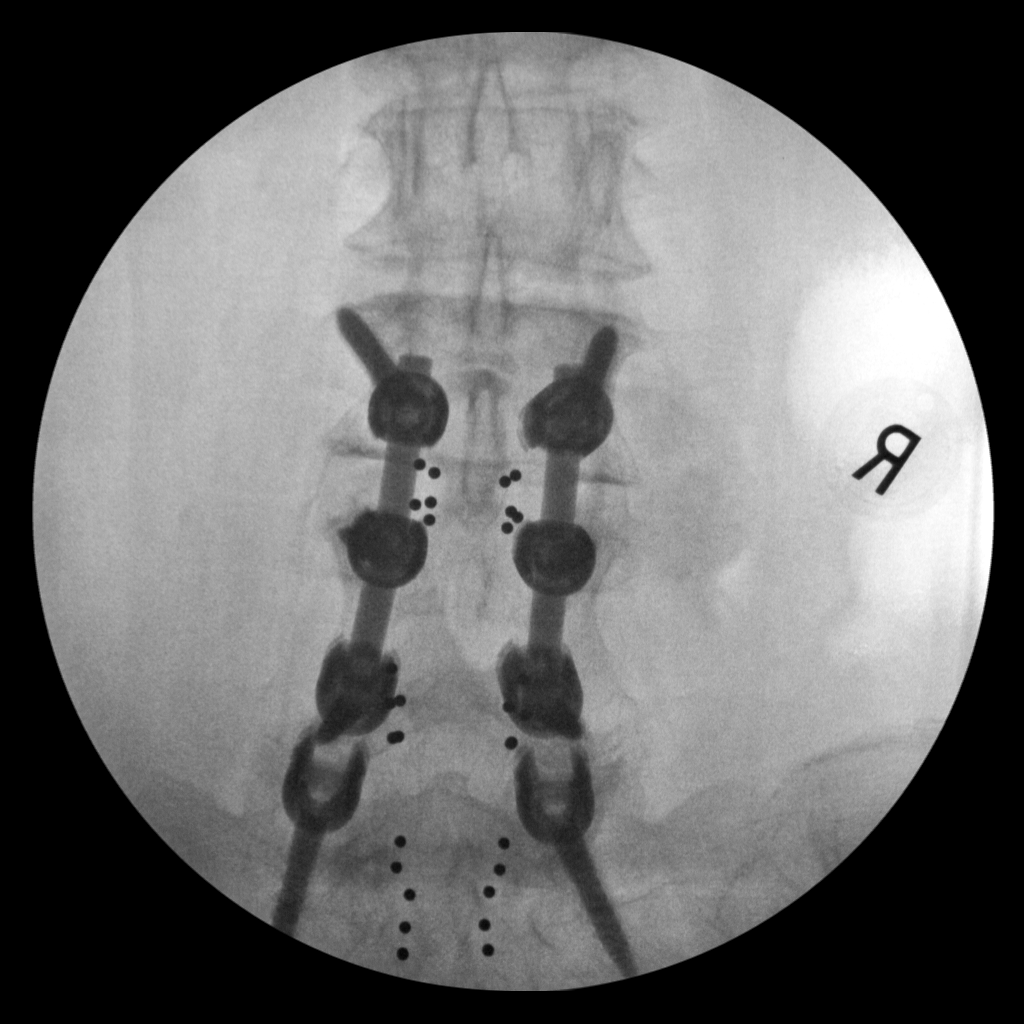
[im 3/3]
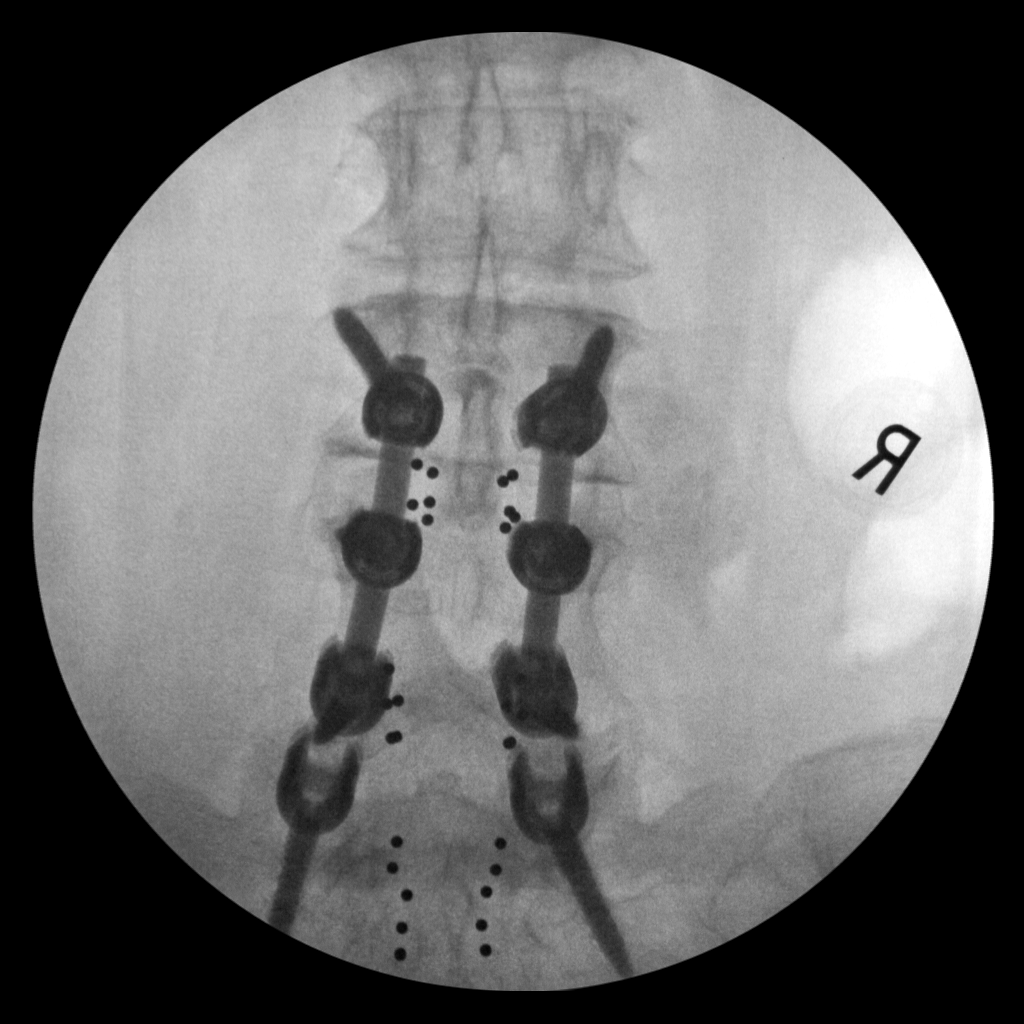

[3 of 3 positions shown; findings below may reference images not displayed]

FINDINGS: AP and lateral C-arm images obtained lumbar spine.

Interval extension of the fusion to include L3-4. Bilateral pedicle
screw and interbody fusion L3-4 and L4-5.

Posterior rods have been removed between L5-S1. Bilateral screws at
S1. Interbody spacer at L5-S1.
IMPRESSION: Interval extension of fusion to include L3-4.

## 2022-02-26 ENCOUNTER — Encounter: Payer: BC Managed Care – PPO | Attending: Physical Medicine and Rehabilitation | Admitting: Registered Nurse

## 2022-02-26 ENCOUNTER — Encounter: Payer: Self-pay | Admitting: Registered Nurse

## 2022-02-26 VITALS — BP 132/82 | HR 60 | Ht 61.0 in | Wt 169.2 lb

## 2022-02-26 DIAGNOSIS — M47816 Spondylosis without myelopathy or radiculopathy, lumbar region: Secondary | ICD-10-CM | POA: Insufficient documentation

## 2022-02-26 DIAGNOSIS — M961 Postlaminectomy syndrome, not elsewhere classified: Secondary | ICD-10-CM | POA: Insufficient documentation

## 2022-02-26 DIAGNOSIS — M545 Low back pain, unspecified: Secondary | ICD-10-CM | POA: Diagnosis not present

## 2022-02-26 DIAGNOSIS — G8929 Other chronic pain: Secondary | ICD-10-CM | POA: Insufficient documentation

## 2022-02-26 DIAGNOSIS — Z79891 Long term (current) use of opiate analgesic: Secondary | ICD-10-CM | POA: Diagnosis present

## 2022-02-26 DIAGNOSIS — M797 Fibromyalgia: Secondary | ICD-10-CM | POA: Insufficient documentation

## 2022-02-26 DIAGNOSIS — G894 Chronic pain syndrome: Secondary | ICD-10-CM | POA: Diagnosis not present

## 2022-02-26 DIAGNOSIS — Z5181 Encounter for therapeutic drug level monitoring: Secondary | ICD-10-CM | POA: Insufficient documentation

## 2022-02-26 DIAGNOSIS — F0781 Postconcussional syndrome: Secondary | ICD-10-CM | POA: Insufficient documentation

## 2022-02-26 MED ORDER — METHYLPHENIDATE HCL 10 MG PO TABS: 10.0000 mg | ORAL_TABLET | Freq: Two times a day (BID) | ORAL | 0 refills | Status: DC

## 2022-02-26 MED ORDER — ALPRAZOLAM 1 MG PO TABS: ORAL_TABLET | ORAL | 3 refills | Status: DC

## 2022-02-26 NOTE — Progress Notes (Signed)
Subjective:    Patient ID: [name], female    DOB: [birth year], 66 y.o.   MRN: [id number]  HPI: [name] is a 66 y.o. female who returns for follow up appointment for chronic pain and medication refill. She states her pain is located in her lower back. She rates her pain 9. Her current exercise regime is walking and performing stretching exercises.  [name] Morphine equivalent is 40.00 MME. She  is also prescribed Alprazolam  .We have discussed the black box warning of using opioids and benzodiazepines. I highlighted the dangers of using these drugs together and discussed the adverse events including respiratory suppression, overdose, cognitive impairment and importance of compliance with current regimen. We will continue to monitor and adjust as indicated.    Last UDS was Performed on 02/27/2022,it was consistent.     Pain Inventory Average Pain 9 Pain Right Now 9 My pain is sharp, stabbing, and aching  In the last 24 hours, has pain interfered with the following? General activity 10 Relation with others 10 Enjoyment of life 10 What TIME of day is your pain at its worst? varies Sleep (in general) Poor  Pain is worse with: walking, bending, sitting, inactivity, standing, and some activites Pain improves with: heat/ice, pacing activities, medication, and injections Relief from Meds: 3  Family History  Problem Relation Age of Onset   Cervical cancer Mother    Diabetes Mother    Stroke Mother    Alzheimer's disease Mother    Heart attack Father    Stroke Father    Colon cancer Father    Anxiety disorder Maternal Aunt    Depression Maternal Aunt    Anxiety disorder Maternal Uncle    Depression Maternal Uncle        suicide attempt by gun shot wound to head. Survived   Alcohol abuse Other    Drug abuse Other    Breast cancer Maternal Grandmother    Diabetes Maternal Grandmother    Breast cancer Paternal Aunt    Colon polyps Sister        x 2   Social  History   Socioeconomic History   Marital status: Married    Spouse name: Not on file   Number of children: 0   Years of education: Not on file   Highest education level: Not on file  Occupational History   Occupation: disabled  Tobacco Use   Smoking status: Never   Smokeless tobacco: Never  Vaping Use   Vaping Use: Never used  Substance and Sexual Activity   Alcohol use: No    Alcohol/week: 0.0 standard drinks   Drug use: No   Sexual activity: Yes  Other Topics Concern   Not on file  Social History Narrative   Pt has h.s. degree   Social Determinants of Health   Financial Resource Strain: Not on file  Food Insecurity: Not on file  Transportation Needs: Not on file  Physical Activity: Not on file  Stress: Not on file  Social Connections: Not on file   Past Surgical History:  Procedure Laterality Date   ABDOMINAL HYSTERECTOMY  1999   bunions removed  1988   C4 C5 cage insertion  06/28/2013   CHOLECYSTECTOMY  04/25/2008   EDG  12/17/2004   ELECTROCARDIOGRAM  05/27/2007   L5 S1 rod insertion  11/07/2012   SKIN CANCER EXCISION     SKIN CANCER EXCISION     several   TONSILLECTOMY  1981  TUBAL LIGATION  1997   Past Surgical History:  Procedure Laterality Date   ABDOMINAL HYSTERECTOMY  1999   bunions removed  1988   C4 C5 cage insertion  06/28/2013   CHOLECYSTECTOMY  04/25/2008   EDG  12/17/2004   ELECTROCARDIOGRAM  05/27/2007   L5 S1 rod insertion  11/07/2012   SKIN CANCER EXCISION     SKIN CANCER EXCISION     several   TONSILLECTOMY  1981   TUBAL LIGATION  1997   Past Medical History:  Diagnosis Date   Anemia    ANXIETY 06/10/2007   Cataract    Chronic headaches    Degenerative arthritis of spine    back and neck   Depression    DYSLIPIDEMIA 06/28/2008   Esophageal stricture    Fibromyalgia    Gallstones    GERD 06/10/2007   Hiatal hernia    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   HYPERTENSION 06/10/2007   Irritable bowel syndrome 06/28/2008   OSTEOARTHRITIS 06/10/2007    Skin cancer    basal cell   Tubular adenoma of colon    BP (!) 147/84 (BP Location: Left Arm, Patient Position: Sitting, Cuff Size: Normal)   Pulse (!) 55   Ht '5\' 1"'$  (1.549 m)   Wt 169 lb 3.2 oz (76.7 kg)   SpO2 99%   BMI 31.97 kg/m   Opioid Risk Score:   Fall Risk Score:  `1  Depression screen PHQ 2/9     02/26/2022    2:03 PM 12/04/2021    1:37 PM 10/07/2021    2:03 PM 09/01/2021    2:22 PM 06/04/2021    2:31 PM 04/30/2021    3:03 PM 03/05/2021    3:13 PM  Depression screen PHQ 2/9  Decreased Interest 3 0 1 0 1 1 0  Down, Depressed, Hopeless 3 0 1 0 1 1 0  PHQ - 2 Score 6 0 2 0 2 2 0     Review of Systems  Constitutional: Negative.   HENT: Negative.    Eyes: Negative.   Respiratory: Negative.    Cardiovascular: Negative.   Gastrointestinal: Negative.   Endocrine: Negative.   Genitourinary: Negative.   Musculoskeletal:  Positive for back pain.  Skin: Negative.   Allergic/Immunologic: Negative.   Neurological: Negative.   Hematological: Negative.   Psychiatric/Behavioral:  Positive for sleep disturbance.       Objective:   Physical Exam Vitals and nursing note reviewed.  Constitutional:      Appearance: Normal appearance.  Cardiovascular:     Rate and Rhythm: Normal rate and regular rhythm.     Pulses: Normal pulses.     Heart sounds: Normal heart sounds.  Pulmonary:     Effort: Pulmonary effort is normal.     Breath sounds: Normal breath sounds.  Musculoskeletal:     Cervical back: Normal range of motion and neck supple.     Comments: Normal Muscle Bulk and Muscle Testing Reveals:  Upper Extremities: Full ROM and Muscle Strength 5/5 Lumbar Paraspinal Tenderness: L-4-L-5 Lower Extremities: Full ROM and Muscle Strength 5/5 Arises from chair slowly Narrow Based  Gait     Skin:    General: Skin is warm and dry.  Neurological:     Mental Status: She is alert and oriented to person, place, and time.  Psychiatric:        Mood and Affect: Mood normal.         Behavior: Behavior normal.  Assessment & Plan:  1. History of chronic lumbar facet disease with spondylosis/DDD/ L4-5 spondylolisthesis.With L4-5 L5-S1 decompression stabilization:  She underwent Posterior Lumbar Iinterbody Fusion - Lumabr three-Lumbar four, on 08/01/2021 by Dr Ronnald Ramp. Neurosurgery Following 02/26/2022 Refilled:Hydrocodone 10/'325mg'$  one tablet 4 times a day  as needed for pain #120.  We will continue the opioid monitoring program, this consists of regular clinic visits, examinations, urine drug screen, pill counts as well as use of [place] Controlled Substance Reporting system. A 12 month History has been reviewed on the [facility] on 02/26/2022. 2. Depression with anxiety/ PTSD:  Dr. Sima Matas Following.Continue Xanax,Abilify and Effexor. 02/26/2022 3. Cervical spondylosis: Stable at this time with no complaints. Continue Medication Regime. 02/26/2022 4. 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 Ritalin: Refilled: Ritalin10 mg  One tablet at Breakfast and Lunch. # 76. Continue with  slow weaning.  05/182023. 5. Muscle Spasm: Continue current medication regimen with  Flexeril: May take 1-2 tablets at HS. 02/26/2022 6. Left Lumbar Radiculitis: No complaints today. Continue HEP as Tolerated . Continue to Monitor. 02/26/2022. 7. Left Knee Osteoarthritis: No complaints today. S/P Left Knee Injection on 10/26/2018 with good relief noted.02/26/2022 8. Right Knee Pain: No complaints today. Continue to alternate with ice and heat therapy. Continue to monitor.  02/26/2022 9. Bilateral Greater Trochanter Tenderness: No complaints today. Continue to alternate Ice and Heat therapy. Continue to monitor. 02/26/2022  10. Forgetfulness:  Dr Sima Matas.Following.  Continue to Monitor. 02/26/2022      F/U in 1 month

## 2022-02-26 NOTE — Patient Instructions (Signed)
Increase Hydrocodone to 5 times a day as needed for pain.  Send a My-Chart or Call on Monday 03/02/2022 with a update on medication change.

## 2022-03-03 ENCOUNTER — Telehealth: Payer: Self-pay | Admitting: *Deleted

## 2022-03-03 NOTE — Telephone Encounter (Signed)
[place] called to request a call back from [place]. She tried to send a myChart but it would not allow her to send.

## 2022-03-03 NOTE — Telephone Encounter (Signed)
Return [name] call,  She states she is taking the Hydrocodone 5 times a day , she started the above regimen on 03/03/2022 as needed for pain, she is receiving some relief of pain with increase frequency of Hydrocodone. She is experiencing some daytime drowsiness, she is not driving during this time. Her husband is driving her to her appointments.  If the drowsiness continues decreased medication to 4 times a day as needed for pain, she verbalizes understanding. We will continue to observe, and she was instructed to call office on Friday 03/06/2022, she verbalizes understanding.

## 2022-03-06 ENCOUNTER — Telehealth: Payer: Self-pay | Admitting: Registered Nurse

## 2022-03-06 NOTE — Telephone Encounter (Signed)
PATIENT WAS TOLD TO CALL [place] BACK ABOUT MEDICATION--SHE IS TAKING AN EXTRA PILL AND IT IS NOT HELPING.  PLEASE CALL.

## 2022-03-06 NOTE — Telephone Encounter (Signed)
Return Ms. [place] call,  No answer, left message to return the call.

## 2022-03-18 ENCOUNTER — Encounter: Payer: BC Managed Care – PPO | Attending: Physical Medicine and Rehabilitation | Admitting: Psychology

## 2022-03-18 DIAGNOSIS — M4696 Unspecified inflammatory spondylopathy, lumbar region: Secondary | ICD-10-CM

## 2022-03-18 DIAGNOSIS — G894 Chronic pain syndrome: Secondary | ICD-10-CM | POA: Insufficient documentation

## 2022-03-18 DIAGNOSIS — M961 Postlaminectomy syndrome, not elsewhere classified: Secondary | ICD-10-CM | POA: Diagnosis not present

## 2022-03-18 DIAGNOSIS — M47816 Spondylosis without myelopathy or radiculopathy, lumbar region: Secondary | ICD-10-CM | POA: Diagnosis not present

## 2022-03-18 DIAGNOSIS — Z5181 Encounter for therapeutic drug level monitoring: Secondary | ICD-10-CM | POA: Insufficient documentation

## 2022-03-18 DIAGNOSIS — F0781 Postconcussional syndrome: Secondary | ICD-10-CM | POA: Insufficient documentation

## 2022-03-18 DIAGNOSIS — Z79891 Long term (current) use of opiate analgesic: Secondary | ICD-10-CM | POA: Insufficient documentation

## 2022-03-18 DIAGNOSIS — M797 Fibromyalgia: Secondary | ICD-10-CM | POA: Insufficient documentation

## 2022-03-19 ENCOUNTER — Encounter: Payer: Self-pay | Admitting: Psychology

## 2022-03-19 NOTE — Progress Notes (Signed)
Patient:                           [name]            DOB:                               1956/01/30   MR Number:                  [id number]   Location:                        [facility] [address], [place] [id number] [place] [address] Dept: [phone]   Start:     3 PM End:                                 4 PM  Today's visit was an in person visit was conducted in my outpatient clinic office with the patient myself present.   Provider/Observer:                           Edgardo Roys PSYD   Chief Complaint:                                   Chief Complaint  Patient presents with   Post-Traumatic Stress Disorder   Pain   Depression   Memory Loss   Anxiety      Reason For Service:                          Marji Kuehnel is a 66 year old female referred by Dr. Naaman Plummer due to issues of coping and dealing with a number of stressors. She has a history of postconcussion syndrome, chronic pain syndrome as well as PTSD.  The patient has been dealing with significant anxiety and depression along with a history of PTSD and postconcussion syndrome. The patient was referred for psychotherapeutic interventions primarily because of the issues of depression and coping with family stress.  The above reason for service was reviewed and remains accurate.  The patient reports that there has been significant improvements in psychosocial issues but there continues to be a lot of stress particularly around issues related to her mother's dementia and the mother not recognizing the patient.  The above reason for service was reviewed and remains applicable for the current visit.  The patient has been having more depressive symptoms with increased psychosocial stressors including her mother at end stages of Alzheimer's Dementia.  The mother is living with patient's sister and while the mother  likely needs a SNF the sister insists on taking care of the mother and allowing the mother to be at home when she dies.  However, this results in patient having to help the sister in significant ways including cleaning the sister's house and caring for mother that has significant medical and neurological needs.    The above reason for service has been reviewed and remains applicable for the current visit.  I have not seen the patient since May of this year.  The patient's mother has  passed away since I saw her and things went relatively smoothly after the death of her mother as the older sister took over the finalization of the will and a state.  The patient continues to have conflicts with her other sister as the other sister is described as having traits consistent with significant narcissistic personality disorder.  The patient reports that she has been doing better after having surgery on her back on 10/22.  The patient reports that she was having significant improvement overall and is no longer having radiating pain.  However, she reports that recently she fell off her bed and began having localized pain around the area of the surgical site.  The patient reports that she was very concerned and has an appointment with her neurosurgeon Dr. Ronnald Ramp 2 days from now.  However, the patient does not describe any particular neurological symptoms of radiating pain to indicate significant damage but more localized pain around surgical site.  Patient has been quite anxious about having done some damage as she had been improving so much as far as her pain symptoms.  Note: The patient has been describing subjective memory changes and difficulties with short-term memory.  We reviewed these issues in depth today and they do not sound particularly consistent with a progressive degenerative neurological type condition.  The patient is taking a number of medications that could have an impact on cognition/attention and  memory.  This includes a multiyear long treatment with alprazolam opiate pain medications as well as her significant pain and postconcussion syndrome.  The patient reports that she has continued to have significant pain symptoms but that her memory has been doing a little bit better.  She is less anxious about having a progressive neurological condition and we have addressed this issue in depth.  Interventions Strategy:                    Cognitive/behavioral psychotherapeutic interventions  Participation Level:                           The patient was appropriate and active during current visit with good mental status.    Participation Quality:                       Appropriate and attentive                          Behavioral Observation:                  Patient was well-groomed, alert and appropriate in her interactions throughout the visit.   Current Psychosocial Factors:        The patient reports that she has been able to distance herself from her sister for the most part and picks and chooses the times that the interact.  This helps her with coping for the most part around the stressors that her sister with significant narcissistic like descriptions (as described by the patient).   Content of Session:                           Reviewed current symptoms and continued to work on issues of depression, pain, PTSD and anxiety.  Reviewed current symptoms and continue to work on therapeutic issues around pain, PTSD, and depression/anxiety.  Current Status:  T the patient reports that her coping skills and strategies have continued to improve and that she is doing much better as far as her interactions.  She reports that her anxiety and depressive symptoms have been improving.  Patient Progress:                               The patient continues to show improvement in her overall functioning.  Impression/Diagnosis:                     [name] is a  66 year old female referred by Dr. Naaman Plummer due to issues of coping and dealing with a number of stressors. She has a history of postconcussion syndrome, chronic pain syndrome as well as PTSD.  The patient describes significant difficulty with issues related to her family and the family estate. There were issues when her father passed away where an older sister was in charge of everything and all of his possessions were been ago to this older sister. However, the middle sister began having trouble and when the patient could not help the sister rejected the patient. There've been a number of family conflicts going on including a niece who got in trouble with doing drugs and her excuse to the patient's sister was that the patient was the one who started her doing drugs and giving her money. This is not accurate.  The patient reports that she managed the death of her mother fairly well.  She continues to have significant pain symptoms, PTSD symptoms and postconcussive symptoms but has been managing better and actively working on therapeutic interventions we have addressed.  The patient has been doing much better after her neurosurgery for posterior lumbar fusion until a recent fall cause localized pain to develop.  The patient reports that she has been doing better from the standpoint of her cognitive symptoms and is excepting more that she does not appear to have any type of progressive neurological condition.  The patient reports that she has been having more significant pain recently and feels like a recent change in medications have left her much more sleepy but not having any particular benefit on her pain level.  Diagnosis:   Post concussion syndrome  Lumbar post-laminectomy syndrome  Lumbar facet arthropathy  Fibromyalgia  Chronic pain syndrome

## 2022-03-25 ENCOUNTER — Encounter (HOSPITAL_BASED_OUTPATIENT_CLINIC_OR_DEPARTMENT_OTHER): Payer: BC Managed Care – PPO | Admitting: Registered Nurse

## 2022-03-25 ENCOUNTER — Encounter: Payer: Self-pay | Admitting: Registered Nurse

## 2022-03-25 VITALS — BP 156/84 | HR 69 | Ht 61.0 in | Wt 169.4 lb

## 2022-03-25 DIAGNOSIS — G894 Chronic pain syndrome: Secondary | ICD-10-CM

## 2022-03-25 DIAGNOSIS — Z79891 Long term (current) use of opiate analgesic: Secondary | ICD-10-CM | POA: Diagnosis present

## 2022-03-25 DIAGNOSIS — F0781 Postconcussional syndrome: Secondary | ICD-10-CM

## 2022-03-25 DIAGNOSIS — Z5181 Encounter for therapeutic drug level monitoring: Secondary | ICD-10-CM | POA: Diagnosis present

## 2022-03-25 DIAGNOSIS — M797 Fibromyalgia: Secondary | ICD-10-CM | POA: Diagnosis present

## 2022-03-25 DIAGNOSIS — M961 Postlaminectomy syndrome, not elsewhere classified: Secondary | ICD-10-CM | POA: Diagnosis present

## 2022-03-25 DIAGNOSIS — M47816 Spondylosis without myelopathy or radiculopathy, lumbar region: Secondary | ICD-10-CM | POA: Diagnosis present

## 2022-03-25 MED ORDER — OXYCODONE HCL 5 MG PO TABS: 5.0000 mg | ORAL_TABLET | Freq: Four times a day (QID) | ORAL | 0 refills | Status: DC | PRN

## 2022-03-25 MED ORDER — METHYLPHENIDATE HCL 10 MG PO TABS: 10.0000 mg | ORAL_TABLET | Freq: Two times a day (BID) | ORAL | 0 refills | Status: DC

## 2022-03-25 NOTE — Progress Notes (Signed)
Subjective:    Patient ID: [name], female    DOB: [birth year], 66 y.o.   MRN: [id number]  HPI: [name] is a 66 y.o. female who returns for follow up appointment for chronic pain and medication refill. She states her pain is located in her lower back and reports she is having muscle spasms in her back. She  rates her pain 8. Her current exercise regime is walking and performing stretching exercises. [name] reports daytime drowsiness with taking the Hydrocodone 5 times a day as needed for pain, she was instructed to decrease the Hydrocodone to 4 times day as needed for pain. She also states she had to take her Ritalin twice a day, to be able to focus. We will resume the slow titration of ritalin, she verbalizes understanding.  [name] has 5 days of Hydrocodone then she will be prescribed Oxycodone 5 mg take one tablet 4 times a day as needed for pain, she verbalizes understanding.   [name] Morphine equivalent is 50.00 MME.  she is also prescribed Alprazolam .We have discussed the black box warning of using opioids and benzodiazepines. I highlighted the dangers of using these drugs together and discussed the adverse events including respiratory suppression, overdose, cognitive impairment and importance of compliance with current regimen. We will continue to monitor and adjust as indicated.   UDS ordered today.    Pain Inventory Average Pain 8 Pain Right Now 8 My pain is intermittent, sharp, dull, stabbing, and aching  In the last 24 hours, has pain interfered with the following? General activity 10 Relation with others 10 Enjoyment of life 10 What TIME of day is your pain at its worst? varies Sleep (in general) Fair  Pain is worse with: walking, sitting, standing, and some activites Pain improves with: rest, heat/ice, pacing activities, medication, and injections Relief from Meds: 7  Family History  Problem Relation Age of Onset   Cervical cancer Mother     Diabetes Mother    Stroke Mother    Alzheimer's disease Mother    Heart attack Father    Stroke Father    Colon cancer Father    Anxiety disorder Maternal Aunt    Depression Maternal Aunt    Anxiety disorder Maternal Uncle    Depression Maternal Uncle        suicide attempt by gun shot wound to head. Survived   Alcohol abuse Other    Drug abuse Other    Breast cancer Maternal Grandmother    Diabetes Maternal Grandmother    Breast cancer Paternal Aunt    Colon polyps Sister        x 2   Social History   Socioeconomic History   Marital status: Married    Spouse name: Not on file   Number of children: 0   Years of education: Not on file   Highest education level: Not on file  Occupational History   Occupation: disabled  Tobacco Use   Smoking status: Never   Smokeless tobacco: Never  Vaping Use   Vaping Use: Never used  Substance and Sexual Activity   Alcohol use: No    Alcohol/week: 0.0 standard drinks of alcohol   Drug use: No   Sexual activity: Yes  Other Topics Concern   Not on file  Social History Narrative   Pt has h.s. degree   Social Determinants of Health   Financial Resource Strain: Not on file  Food Insecurity: Not on file  Transportation  Needs: Not on file  Physical Activity: Not on file  Stress: Not on file  Social Connections: Not on file   Past Surgical History:  Procedure Laterality Date   ABDOMINAL HYSTERECTOMY  1999   bunions removed  1988   C4 C5 cage insertion  06/28/2013   CHOLECYSTECTOMY  04/25/2008   EDG  12/17/2004   ELECTROCARDIOGRAM  05/27/2007   L5 S1 rod insertion  11/07/2012   SKIN CANCER EXCISION     SKIN CANCER EXCISION     several   [place]   Past Surgical History:  Procedure Laterality Date   ABDOMINAL HYSTERECTOMY  1999   bunions removed  1988   C4 C5 cage insertion  06/28/2013   CHOLECYSTECTOMY  04/25/2008   EDG  12/17/2004   ELECTROCARDIOGRAM  05/27/2007   L5 S1 rod insertion  11/07/2012    SKIN CANCER EXCISION     SKIN CANCER EXCISION     several   TONSILLECTOMY  1981   TUBAL LIGATION  1997   Past Medical History:  Diagnosis Date   Anemia    ANXIETY 06/10/2007   Cataract    Chronic headaches    Degenerative arthritis of spine    back and neck   Depression    DYSLIPIDEMIA 06/28/2008   Esophageal stricture    Fibromyalgia    Gallstones    GERD 06/10/2007   Hiatal hernia    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   HYPERTENSION 06/10/2007   Irritable bowel syndrome 06/28/2008   OSTEOARTHRITIS 06/10/2007   Skin cancer    basal cell   Tubular adenoma of colon    BP (!) 156/84   Pulse 69   Ht '5\' 1"'$  (1.549 m)   Wt 169 lb 6.4 oz (76.8 kg)   SpO2 97%   BMI 32.01 kg/m   Opioid Risk Score:   Fall Risk Score:  `1  Depression screen PHQ 2/9     03/25/2022    1:41 PM 02/26/2022    2:03 PM 12/04/2021    1:37 PM 10/07/2021    2:03 PM 09/01/2021    2:22 PM 06/04/2021    2:31 PM 04/30/2021    3:03 PM  Depression screen PHQ 2/9  Decreased Interest 1 3 0 1 0 1 1  Down, Depressed, Hopeless 1 3 0 1 0 1 1  PHQ - 2 Score 2 6 0 2 0 2 2     Review of Systems  Constitutional: Negative.   HENT: Negative.    Eyes: Negative.   Respiratory: Negative.    Cardiovascular: Negative.   Gastrointestinal: Negative.   Endocrine: Negative.   Genitourinary: Negative.   Musculoskeletal:  Positive for back pain.  Skin: Negative.   Allergic/Immunologic: Negative.   Neurological: Negative.   Hematological: Negative.   Psychiatric/Behavioral: Negative.        Objective:   Physical Exam Vitals and nursing note reviewed.  Constitutional:      Appearance: Normal appearance.  Cardiovascular:     Rate and Rhythm: Normal rate and regular rhythm.     Pulses: Normal pulses.     Heart sounds: Normal heart sounds.  Pulmonary:     Effort: Pulmonary effort is normal.     Breath sounds: Normal breath sounds.  Musculoskeletal:     Cervical back: Normal range of motion and neck supple.     Comments:  Normal Muscle Bulk and Muscle Testing Reveals:  Upper Extremities: Full ROM and Muscle Strength 5/5  Lumbar Paraspinal Tenderness: L-4-L-5 Mainly Right Side Lower Extremities: Full ROM and Muscle Strength 5/5 Arises from Chair slowly Antalgic  Gait     Skin:    General: Skin is warm and dry.  Neurological:     Mental Status: She is alert and oriented to person, place, and time.  Psychiatric:        Mood and Affect: Mood normal.        Behavior: Behavior normal.         Assessment & Plan:  1. History of chronic lumbar facet disease with spondylosis/DDD/ L4-5 spondylolisthesis.With L4-5 L5-S1 decompression stabilization:  She underwent Posterior Lumbar Iinterbody Fusion - Lumabr three-Lumbar four, on 08/01/2021 by Dr Ronnald Ramp. Neurosurgery Following 03/25/2022 Continue :Hydrocodone 10/'325mg'$  one tablet 4 times a day  as needed for pain. On 03/31/2022 RX: Oxycodone 5 mg one tablet 4 times a day as needed for pain. #120 We will continue the opioid monitoring program, this consists of regular clinic visits, examinations, urine drug screen, pill counts as well as use of [place] Controlled Substance Reporting system. A 12 month History has been reviewed on the [place] on 03/25/2022. 2. Depression with anxiety/ PTSD:  Dr. Sima Matas Following.Continue Xanax,Abilify and Effexor. 03/25/2022 3. Cervical spondylosis: Stable at this time with no complaints. Continue Medication Regime. 03/25/2022 4. 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 Ritalin: Refilled: Ritalin10 mg  One tablet at Breakfast and Lunch. # 23. Continue with  slow weaning.  06/142023. 5. Muscle Spasm: Continue current medication regimen with  Flexeril: May take 1-2 tablets at HS. 03/25/2022 6. Left Lumbar Radiculitis: No complaints today. Continue HEP as Tolerated . Continue to Monitor. 03/25/2022. 7. Left Knee Osteoarthritis: No complaints  today. S/P Left Knee Injection on 10/26/2018 with good relief noted.03/25/2022 8. Right Knee Pain: No complaints today. Continue to alternate with ice and heat therapy. Continue to monitor.  03/25/2022 9. Bilateral Greater Trochanter Tenderness: No complaints today. Continue to alternate Ice and Heat therapy. Continue to monitor. 03/25/2022  10. Forgetfulness:  Dr Sima Matas.Following.  Continue to Monitor. 03/25/2022      F/U in 1 month

## 2022-03-25 NOTE — Patient Instructions (Addendum)
You have 5 days of Hydrocodone:  You are to take your hydrocodone 4 times a day as needed for pain ONLY 4 times a day !!!!! Starting today 03/25/2022  Starting on 06/ 20/2023: You will start Oxycodone 5 mg 4 times a day as needed for pain.

## 2022-03-31 LAB — TOXASSURE SELECT,+ANTIDEPR,UR

## 2022-04-07 ENCOUNTER — Telehealth: Payer: Self-pay | Admitting: *Deleted

## 2022-04-07 NOTE — Telephone Encounter (Signed)
Urine drug screen for this encounter is consistent for prescribed medication 

## 2022-04-22 ENCOUNTER — Encounter: Payer: PPO | Attending: Physical Medicine and Rehabilitation | Admitting: Registered Nurse

## 2022-04-22 ENCOUNTER — Encounter: Payer: Self-pay | Admitting: Registered Nurse

## 2022-04-22 VITALS — BP 134/85 | HR 66 | Ht 61.0 in | Wt 168.6 lb

## 2022-04-22 DIAGNOSIS — M25561 Pain in right knee: Secondary | ICD-10-CM | POA: Insufficient documentation

## 2022-04-22 DIAGNOSIS — G894 Chronic pain syndrome: Secondary | ICD-10-CM | POA: Diagnosis present

## 2022-04-22 DIAGNOSIS — M25562 Pain in left knee: Secondary | ICD-10-CM | POA: Insufficient documentation

## 2022-04-22 DIAGNOSIS — M961 Postlaminectomy syndrome, not elsewhere classified: Secondary | ICD-10-CM | POA: Insufficient documentation

## 2022-04-22 DIAGNOSIS — M47816 Spondylosis without myelopathy or radiculopathy, lumbar region: Secondary | ICD-10-CM | POA: Insufficient documentation

## 2022-04-22 DIAGNOSIS — M797 Fibromyalgia: Secondary | ICD-10-CM | POA: Insufficient documentation

## 2022-04-22 DIAGNOSIS — F0781 Postconcussional syndrome: Secondary | ICD-10-CM | POA: Diagnosis present

## 2022-04-22 DIAGNOSIS — M545 Low back pain, unspecified: Secondary | ICD-10-CM | POA: Diagnosis not present

## 2022-04-22 DIAGNOSIS — Z79891 Long term (current) use of opiate analgesic: Secondary | ICD-10-CM | POA: Diagnosis present

## 2022-04-22 DIAGNOSIS — Z5181 Encounter for therapeutic drug level monitoring: Secondary | ICD-10-CM | POA: Insufficient documentation

## 2022-04-22 DIAGNOSIS — G8929 Other chronic pain: Secondary | ICD-10-CM | POA: Insufficient documentation

## 2022-04-22 MED ORDER — OXYCODONE HCL 5 MG PO TABS: 5.0000 mg | ORAL_TABLET | Freq: Four times a day (QID) | ORAL | 0 refills | Status: DC | PRN

## 2022-04-22 MED ORDER — METHYLPHENIDATE HCL 10 MG PO TABS: 10.0000 mg | ORAL_TABLET | Freq: Two times a day (BID) | ORAL | 0 refills | Status: DC

## 2022-04-22 NOTE — Progress Notes (Signed)
Subjective:    Patient ID: [name], female    DOB: 09-06-1956, 66 y.o.   MRN: [id number]  HPI: [name] is a 66 y.o. female who returns for follow up appointment for chronic pain and medication refill. She states her pain is located in her lower back and bilateral knees R>L. She rates her pain 8. Her current exercise regime is walking and performing stretching exercises.  [name] Morphine equivalent is 30.00 MME.   Last UDS was Performed on 03/25/2022, it was consistent.     Pain Inventory Average Pain 6 Pain Right Now 8 My pain is burning, dull, and aching  In the last 24 hours, has pain interfered with the following? General activity 10 Relation with others 10 Enjoyment of life 10 What TIME of day is your pain at its worst? evening Sleep (in general) Fair  Pain is worse with: walking, sitting, standing, and some activites Pain improves with: rest, heat/ice, pacing activities, medication, and injections Relief from Meds: 8  Family History  Problem Relation Age of Onset   Cervical cancer Mother    Diabetes Mother    Stroke Mother    Alzheimer's disease Mother    Heart attack Father    Stroke Father    Colon cancer Father    Anxiety disorder Maternal Aunt    Depression Maternal Aunt    Anxiety disorder Maternal Uncle    Depression Maternal Uncle        suicide attempt by gun shot wound to head. Survived   Alcohol abuse Other    Drug abuse Other    Breast cancer Maternal Grandmother    Diabetes Maternal Grandmother    Breast cancer Paternal Aunt    Colon polyps Sister        x 2   Social History   Socioeconomic History   Marital status: Married    Spouse name: Not on file   Number of children: 0   Years of education: Not on file   Highest education level: Not on file  Occupational History   Occupation: disabled  Tobacco Use   Smoking status: Never   Smokeless tobacco: Never  Vaping Use   Vaping Use: Never used  Substance and Sexual  Activity   Alcohol use: No    Alcohol/week: 0.0 standard drinks of alcohol   Drug use: No   Sexual activity: Yes  Other Topics Concern   Not on file  Social History Narrative   Pt has h.s. degree   Social Determinants of Health   Financial Resource Strain: Not on file  Food Insecurity: Not on file  Transportation Needs: Not on file  Physical Activity: Not on file  Stress: Not on file  Social Connections: Not on file   Past Surgical History:  Procedure Laterality Date   ABDOMINAL HYSTERECTOMY  1999   bunions removed  1988   C4 C5 cage insertion  06/28/2013   CHOLECYSTECTOMY  04/25/2008   EDG  12/17/2004   ELECTROCARDIOGRAM  05/27/2007   L5 S1 rod insertion  11/07/2012   SKIN CANCER EXCISION     SKIN CANCER EXCISION     several   [place]   Past Surgical History:  Procedure Laterality Date   ABDOMINAL HYSTERECTOMY  1999   bunions removed  1988   C4 C5 cage insertion  06/28/2013   CHOLECYSTECTOMY  04/25/2008   EDG  12/17/2004   ELECTROCARDIOGRAM  05/27/2007  L5 S1 rod insertion  11/07/2012   SKIN CANCER EXCISION     SKIN CANCER EXCISION     several   TONSILLECTOMY  1981   TUBAL LIGATION  1997   Past Medical History:  Diagnosis Date   Anemia    ANXIETY 06/10/2007   Cataract    Chronic headaches    Degenerative arthritis of spine    back and neck   Depression    DYSLIPIDEMIA 06/28/2008   Esophageal stricture    Fibromyalgia    Gallstones    GERD 06/10/2007   Hiatal hernia    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   HYPERTENSION 06/10/2007   Irritable bowel syndrome 06/28/2008   OSTEOARTHRITIS 06/10/2007   Skin cancer    basal cell   Tubular adenoma of colon    BP 134/85   Pulse 66   Ht '5\' 1"'$  (1.549 m)   Wt 168 lb 9.6 oz (76.5 kg)   SpO2 99%   BMI 31.86 kg/m   Opioid Risk Score:   Fall Risk Score:  `1  Depression screen PHQ 2/9     04/22/2022    2:12 PM 03/25/2022    1:41 PM 02/26/2022    2:03 PM 12/04/2021    1:37 PM 10/07/2021    2:03  PM 09/01/2021    2:22 PM 06/04/2021    2:31 PM  Depression screen PHQ 2/9  Decreased Interest 0 1 3 0 1 0 1  Down, Depressed, Hopeless 0 1 3 0 1 0 1  PHQ - 2 Score 0 2 6 0 2 0 2     Review of Systems  Constitutional: Negative.   HENT: Negative.    Eyes: Negative.   Respiratory: Negative.    Cardiovascular: Negative.   Gastrointestinal: Negative.   Endocrine: Negative.   Genitourinary: Negative.   Musculoskeletal:  Positive for back pain.  Skin: Negative.   Allergic/Immunologic: Negative.   Neurological: Negative.   Hematological: Negative.   Psychiatric/Behavioral: Negative.        Objective:   Physical Exam Vitals and nursing note reviewed.  Constitutional:      Appearance: Normal appearance.  Cardiovascular:     Rate and Rhythm: Normal rate and regular rhythm.     Pulses: Normal pulses.     Heart sounds: Normal heart sounds.  Pulmonary:     Effort: Pulmonary effort is normal.     Breath sounds: Normal breath sounds.  Musculoskeletal:     Cervical back: Normal range of motion and neck supple.     Comments: Normal Muscle Bulk and Muscle Testing Reveals:  Upper Extremities:Full  ROM and Muscle Strength 5/5 Lumbar Hypersensitivity Lower Extremities: Full ROM and Muscle Strength 5/5 Arises from Chair with ease Narrow Based  Gait     Skin:    General: Skin is warm and dry.  Neurological:     Mental Status: She is alert and oriented to person, place, and time.  Psychiatric:        Mood and Affect: Mood normal.        Behavior: Behavior normal.         Assessment & Plan:  1. History of chronic lumbar facet disease with spondylosis/DDD/ L4-5 spondylolisthesis.With L4-5 L5-S1 decompression stabilization:  She underwent Posterior Lumbar Iinterbody Fusion - Lumabr three-Lumbar four, on 08/01/2021 by Dr Ronnald Ramp. Neurosurgery Following 04/22/2022 Refilled: Oxycodone 5 mg one tablet 4 times a day as needed for pain. #120 We will continue the opioid monitoring program,  this consists of regular clinic visits,  examinations, urine drug screen, pill counts as well as use of [place] Controlled Substance Reporting system. A 12 month History has been reviewed on the [place] on 04/22/2022. 2. Depression with anxiety/ PTSD:  Dr. Sima Matas Following.Continue Xanax,Abilify and Effexor. 04/22/2022 3. Cervical spondylosis: Stable at this time with no complaints. Continue Medication Regime. 04/22/2022 4. 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 Ritalin: Refilled: Ritalin10 mg  One tablet at Breakfast and Lunch. # 57. Continue with  slow weaning.  07/122023. 5. Muscle Spasm: Continue current medication regimen with  Flexeril: May take 1-2 tablets at HS. 04/22/2022 6. Left Lumbar Radiculitis: No complaints today. Continue HEP as Tolerated . Continue to Monitor. 04/22/2022. 7. Left Knee Osteoarthritis: No complaints today. S/P Left Knee Injection on 10/26/2018 with good relief noted.04/22/2022 8. Right Knee Pain: No complaints today. Continue to alternate with ice and heat therapy. Continue to monitor.  04/22/2022 9. Bilateral Greater Trochanter Tenderness: No complaints today. Continue to alternate Ice and Heat therapy. Continue to monitor. 04/22/2022  10. Forgetfulness:  No complaints today. Dr Sima Matas.Following.  Continue to Monitor. 04/22/2022      F/U in 1 month

## 2022-04-30 ENCOUNTER — Telehealth: Payer: Self-pay

## 2022-04-30 NOTE — Telephone Encounter (Signed)
[name] will only be able to get the 7 day supply of Oxycodone IR  5 MG tablet. Because she has a new insurance. A new 30 day script  will need to be sent to pharmacy at a later date. Patient was advised to call back for the request when she is half way through the 7 day supply. [name]

## 2022-05-05 ENCOUNTER — Telehealth: Payer: Self-pay

## 2022-05-05 NOTE — Telephone Encounter (Signed)
Patient called and stated she needs the remainder of her medication sent to [facility]. She was only able to get a 7 day supply.

## 2022-05-06 MED ORDER — OXYCODONE HCL 5 MG PO TABS: 5.0000 mg | ORAL_TABLET | Freq: Four times a day (QID) | ORAL | 0 refills | Status: DC | PRN

## 2022-05-06 NOTE — Telephone Encounter (Signed)
PMP was Reviewed Oxycodone e-scribed today.  [name] is aware via My-Chart message

## 2022-05-18 ENCOUNTER — Ambulatory Visit: Payer: BC Managed Care – PPO | Admitting: Psychology

## 2022-05-28 ENCOUNTER — Encounter: Payer: PPO | Attending: Physical Medicine and Rehabilitation | Admitting: Registered Nurse

## 2022-05-28 ENCOUNTER — Encounter: Payer: Self-pay | Admitting: Registered Nurse

## 2022-05-28 VITALS — BP 122/75 | HR 79 | Ht 61.0 in | Wt 178.0 lb

## 2022-05-28 DIAGNOSIS — Z5181 Encounter for therapeutic drug level monitoring: Secondary | ICD-10-CM

## 2022-05-28 DIAGNOSIS — M25562 Pain in left knee: Secondary | ICD-10-CM

## 2022-05-28 DIAGNOSIS — M25561 Pain in right knee: Secondary | ICD-10-CM | POA: Diagnosis present

## 2022-05-28 DIAGNOSIS — M47816 Spondylosis without myelopathy or radiculopathy, lumbar region: Secondary | ICD-10-CM

## 2022-05-28 DIAGNOSIS — G894 Chronic pain syndrome: Secondary | ICD-10-CM | POA: Diagnosis present

## 2022-05-28 DIAGNOSIS — F0781 Postconcussional syndrome: Secondary | ICD-10-CM

## 2022-05-28 DIAGNOSIS — G8929 Other chronic pain: Secondary | ICD-10-CM

## 2022-05-28 DIAGNOSIS — Z79891 Long term (current) use of opiate analgesic: Secondary | ICD-10-CM

## 2022-05-28 DIAGNOSIS — M797 Fibromyalgia: Secondary | ICD-10-CM

## 2022-05-28 DIAGNOSIS — M961 Postlaminectomy syndrome, not elsewhere classified: Secondary | ICD-10-CM

## 2022-05-28 DIAGNOSIS — M545 Low back pain, unspecified: Secondary | ICD-10-CM

## 2022-05-28 MED ORDER — CYCLOBENZAPRINE HCL 5 MG PO TABS: 5.0000 mg | ORAL_TABLET | Freq: Every evening | ORAL | 3 refills | Status: DC | PRN

## 2022-05-28 MED ORDER — HYDROCODONE-ACETAMINOPHEN 10-325 MG PO TABS: 1.0000 | ORAL_TABLET | Freq: Four times a day (QID) | ORAL | 0 refills | Status: DC | PRN

## 2022-05-28 MED ORDER — ALPRAZOLAM 1 MG PO TABS: ORAL_TABLET | ORAL | 3 refills | Status: DC

## 2022-05-28 MED ORDER — METHYLPHENIDATE HCL 10 MG PO TABS: 10.0000 mg | ORAL_TABLET | Freq: Two times a day (BID) | ORAL | 0 refills | Status: DC

## 2022-05-28 NOTE — Patient Instructions (Signed)
Send a My chart message when you pick up your hydrocodone

## 2022-05-28 NOTE — Progress Notes (Signed)
Subjective:    Patient ID: [name], female    DOB: 1955-12-06, 66 y.o.   MRN: [id number]  HPI: [name] is a 66 y.o. female who returns for follow up appointment for chronic pain and medication refill. She states her pain is located in her lower back. She rates her pain 9. Her current exercise regime is walking and performing stretching exercises.  [name] Morphine equivalent is 30.00 MME. She is also prescribed Alprazolam. .We have discussed the black box warning of using opioids and benzodiazepines. I highlighted the dangers of using these drugs together and discussed the adverse events including respiratory suppression, overdose, cognitive impairment and importance of compliance with current regimen. We will continue to monitor and adjust as indicated.   Last UDS was Performed on 03/25/2022, it was consistent.   Pain Inventory Average Pain 9 Pain Right Now 9 My pain is sharp, dull, stabbing, and aching  In the last 24 hours, has pain interfered with the following? General activity 10 Relation with others 10 Enjoyment of life 10 What TIME of day is your pain at its worst? varies Sleep (in general) Fair  Pain is worse with: walking, bending, sitting, standing, and some activites Pain improves with: rest, heat/ice, pacing activities, medication, and injections Relief from Meds: 5  Family History  Problem Relation Age of Onset  . Cervical cancer Mother   . Diabetes Mother   . Stroke Mother   . Alzheimer's disease Mother   . Heart attack Father   . Stroke Father   . Colon cancer Father   . Anxiety disorder Maternal Aunt   . Depression Maternal Aunt   . Anxiety disorder Maternal Uncle   . Depression Maternal Uncle        suicide attempt by gun shot wound to head. Survived  . Alcohol abuse Other   . Drug abuse Other   . Breast cancer Maternal Grandmother   . Diabetes Maternal Grandmother   . Breast cancer Paternal Aunt   . Colon polyps Sister        x 2    Social History   Socioeconomic History  . Marital status: Married    Spouse name: Not on file  . Number of children: 0  . Years of education: Not on file  . Highest education level: Not on file  Occupational History  . Occupation: disabled  Tobacco Use  . Smoking status: Never  . Smokeless tobacco: Never  Vaping Use  . Vaping Use: Never used  Substance and Sexual Activity  . Alcohol use: No    Alcohol/week: 0.0 standard drinks of alcohol  . Drug use: No  . Sexual activity: Yes  Other Topics Concern  . Not on file  Social History Narrative   Pt has h.s. degree   Social Determinants of Health   Financial Resource Strain: Not on file  Food Insecurity: Not on file  Transportation Needs: Not on file  Physical Activity: Not on file  Stress: Not on file  Social Connections: Not on file   Past Surgical History:  Procedure Laterality Date  . ABDOMINAL HYSTERECTOMY  1999  . bunions removed  1988  . C4 C5 cage insertion  06/28/2013  . CHOLECYSTECTOMY  04/25/2008  . EDG  12/17/2004  . ELECTROCARDIOGRAM  05/27/2007  . L5 S1 rod insertion  11/07/2012  . SKIN CANCER EXCISION    . SKIN CANCER EXCISION     several  . TONSILLECTOMY  1981  . TUBAL  LIGATION  1997   Past Surgical History:  Procedure Laterality Date  . ABDOMINAL HYSTERECTOMY  1999  . bunions removed  1988  . C4 C5 cage insertion  06/28/2013  . CHOLECYSTECTOMY  04/25/2008  . EDG  12/17/2004  . ELECTROCARDIOGRAM  05/27/2007  . L5 S1 rod insertion  11/07/2012  . SKIN CANCER EXCISION    . SKIN CANCER EXCISION     several  . TONSILLECTOMY  1981  . TUBAL LIGATION  1997   Past Medical History:  Diagnosis Date  . Anemia   . ANXIETY 06/10/2007  . Cataract   . Chronic headaches   . Degenerative arthritis of spine    back and neck  . Depression   . DYSLIPIDEMIA 06/28/2008  . Esophageal stricture   . Fibromyalgia   . Gallstones   . GERD 06/10/2007  . Hiatal hernia   . 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 HYPERTENSION 06/10/2007   . Irritable bowel syndrome 06/28/2008  . OSTEOARTHRITIS 06/10/2007  . Skin cancer    basal cell  . Tubular adenoma of colon    BP 122/75   Pulse 79   Ht '5\' 1"'$  (1.549 m)   Wt 178 lb (80.7 kg)   SpO2 97%   BMI 33.63 kg/m   Opioid Risk Score:   Fall Risk Score:  `1  Depression screen PHQ 2/9     05/28/2022    2:14 PM 04/22/2022    2:12 PM 03/25/2022    1:41 PM 02/26/2022    2:03 PM 12/04/2021    1:37 PM 10/07/2021    2:03 PM 09/01/2021    2:22 PM  Depression screen PHQ 2/9  Decreased Interest 0 0 1 3 0 1 0  Down, Depressed, Hopeless 0 0 1 3 0 1 0  PHQ - 2 Score 0 0 2 6 0 2 0     Review of Systems  Musculoskeletal:  Positive for back pain.  All other systems reviewed and are negative.     Objective:   Physical Exam Vitals and nursing note reviewed.  Constitutional:      Appearance: Normal appearance.  Cardiovascular:     Rate and Rhythm: Normal rate and regular rhythm.     Pulses: Normal pulses.     Heart sounds: Normal heart sounds.  Pulmonary:     Effort: Pulmonary effort is normal.     Breath sounds: Normal breath sounds.  Musculoskeletal:     Cervical back: Normal range of motion and neck supple.     Comments: Normal Muscle Bulk and Muscle Testing Reveals:  Upper Extremities: Full ROM and Muscle Strength 5/5 Lumbar Paraspinal Tenderness: L-3-L-5 Lower Extremities: Full ROM and Muscle Strength 5/5 Lower Extremities Flexion Produces Pain into her Lumbar R>L Arises from Table slowly Narrow Based  Gait     Skin:    General: Skin is warm and dry.  Neurological:     Mental Status: She is alert and oriented to person, place, and time.  Psychiatric:        Mood and Affect: Mood normal.        Behavior: Behavior normal.         Assessment & Plan:  1. History of chronic lumbar facet disease with spondylosis/DDD/ L4-5 spondylolisthesis.With L4-5 L5-S1 decompression stabilization:  She underwent Posterior Lumbar Iinterbody Fusion - Lumabr three-Lumbar four,  on 08/01/2021 by Dr Ronnald Ramp. Neurosurgery Following 05/28/2022 RX: Hydrocodone '10mg'$ /325 one tablet every 6 hours as needed for pain #120. Discontinue  Oxycodone 5 mg one  tablet 4 times a day as needed for pain. #120 We will continue the opioid monitoring program, this consists of regular clinic visits, examinations, urine drug screen, pill counts as well as use of [place] Controlled Substance Reporting system. A 12 month History has been reviewed on the [place] on 05/28/2022. 2. Depression with anxiety/ PTSD:  Dr. Sima Matas Following.Continue Xanax,Abilify and Effexor. 05/28/2022 3. Cervical spondylosis: Stable at this time with no complaints. Continue Medication Regime. 05/28/2022 4. 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 Ritalin: Refilled: Ritalin10 mg  One tablet at Breakfast and Lunch. # 2. Continue with  slow weaning.  05/28/2022. 5. Muscle Spasm: Continue current medication regimen with  Flexeril: May take 1-2 tablets at HS. 05/28/2022 6. Left Lumbar Radiculitis: No complaints today. Continue HEP as Tolerated . Continue to Monitor. 05/28/2022. 7. Left Knee Osteoarthritis: No complaints today. S/P Left Knee Injection on 10/26/2018 with good relief noted.05/28/2022 8. Right Knee Pain: No complaints today. Continue to alternate with ice and heat therapy. Continue to monitor.  05/28/2022 9. Bilateral Greater Trochanter Tenderness: No complaints today. Continue to alternate Ice and Heat therapy. Continue to monitor. 05/28/2022  10. Forgetfulness:  No complaints today. Dr Sima Matas.Following.  Continue to Monitor. 05/28/2022      F/U in 1 month

## 2022-06-01 ENCOUNTER — Encounter: Payer: Self-pay | Admitting: Registered Nurse

## 2022-06-26 ENCOUNTER — Telehealth: Payer: Self-pay

## 2022-06-26 DIAGNOSIS — F0781 Postconcussional syndrome: Secondary | ICD-10-CM

## 2022-06-26 NOTE — Telephone Encounter (Signed)
Patient calling in regarding medication refill for Hydrocodone, patient is due for refill 06/27/22 patient stated she had enough medicine to last her through 07/01/22 her appt is on 07/06/22, left VM with patient to advise she can call for an earlier appointment, also added patient to  waitlist for earlier appointment as well if it becomes available.

## 2022-06-29 ENCOUNTER — Encounter: Payer: BC Managed Care – PPO | Admitting: Psychology

## 2022-06-30 MED ORDER — METHYLPHENIDATE HCL 10 MG PO TABS: 10.0000 mg | ORAL_TABLET | Freq: Two times a day (BID) | ORAL | 0 refills | Status: DC

## 2022-06-30 MED ORDER — HYDROCODONE-ACETAMINOPHEN 10-325 MG PO TABS: 1.0000 | ORAL_TABLET | Freq: Four times a day (QID) | ORAL | 0 refills | Status: DC | PRN

## 2022-06-30 NOTE — Telephone Encounter (Signed)
PMP was Reviewed.  Medication e-scribed to pharmacy, Call placed to Ms Huckeba, she verbalizes understanding.

## 2022-06-30 NOTE — Telephone Encounter (Signed)
Patient called stating she called last week because she noticed that her appointment and medications were not aligned. She has been out of medication since 06/26/22 and does not have an appointment until 07/06/22. She needs all of  her medications sent in for her.

## 2022-07-06 ENCOUNTER — Encounter: Payer: BC Managed Care – PPO | Admitting: Registered Nurse

## 2022-07-08 ENCOUNTER — Other Ambulatory Visit: Payer: Self-pay | Admitting: Registered Nurse

## 2022-07-22 ENCOUNTER — Encounter: Payer: PPO | Attending: Physical Medicine and Rehabilitation | Admitting: Registered Nurse

## 2022-07-22 ENCOUNTER — Encounter: Payer: Self-pay | Admitting: Registered Nurse

## 2022-07-22 VITALS — BP 146/84 | HR 66 | Ht 61.0 in | Wt 172.0 lb

## 2022-07-22 DIAGNOSIS — F0781 Postconcussional syndrome: Secondary | ICD-10-CM

## 2022-07-22 DIAGNOSIS — M961 Postlaminectomy syndrome, not elsewhere classified: Secondary | ICD-10-CM | POA: Diagnosis not present

## 2022-07-22 DIAGNOSIS — M25572 Pain in left ankle and joints of left foot: Secondary | ICD-10-CM

## 2022-07-22 DIAGNOSIS — F4310 Post-traumatic stress disorder, unspecified: Secondary | ICD-10-CM | POA: Diagnosis not present

## 2022-07-22 DIAGNOSIS — M4726 Other spondylosis with radiculopathy, lumbar region: Secondary | ICD-10-CM | POA: Diagnosis not present

## 2022-07-22 DIAGNOSIS — M797 Fibromyalgia: Secondary | ICD-10-CM | POA: Diagnosis not present

## 2022-07-22 DIAGNOSIS — M62838 Other muscle spasm: Secondary | ICD-10-CM | POA: Diagnosis not present

## 2022-07-22 DIAGNOSIS — M1712 Unilateral primary osteoarthritis, left knee: Secondary | ICD-10-CM | POA: Insufficient documentation

## 2022-07-22 DIAGNOSIS — W19XXXD Unspecified fall, subsequent encounter: Secondary | ICD-10-CM

## 2022-07-22 DIAGNOSIS — M545 Low back pain, unspecified: Secondary | ICD-10-CM | POA: Diagnosis not present

## 2022-07-22 DIAGNOSIS — M47812 Spondylosis without myelopathy or radiculopathy, cervical region: Secondary | ICD-10-CM | POA: Insufficient documentation

## 2022-07-22 DIAGNOSIS — Y92838 Other recreation area as the place of occurrence of the external cause: Secondary | ICD-10-CM | POA: Insufficient documentation

## 2022-07-22 DIAGNOSIS — Z5181 Encounter for therapeutic drug level monitoring: Secondary | ICD-10-CM

## 2022-07-22 DIAGNOSIS — G894 Chronic pain syndrome: Secondary | ICD-10-CM

## 2022-07-22 DIAGNOSIS — W1830XA Fall on same level, unspecified, initial encounter: Secondary | ICD-10-CM | POA: Insufficient documentation

## 2022-07-22 DIAGNOSIS — G8929 Other chronic pain: Secondary | ICD-10-CM

## 2022-07-22 DIAGNOSIS — M47816 Spondylosis without myelopathy or radiculopathy, lumbar region: Secondary | ICD-10-CM

## 2022-07-22 DIAGNOSIS — Y9301 Activity, walking, marching and hiking: Secondary | ICD-10-CM | POA: Insufficient documentation

## 2022-07-22 DIAGNOSIS — F418 Other specified anxiety disorders: Secondary | ICD-10-CM | POA: Diagnosis not present

## 2022-07-22 DIAGNOSIS — Z79891 Long term (current) use of opiate analgesic: Secondary | ICD-10-CM

## 2022-07-22 MED ORDER — METHYLPHENIDATE HCL 10 MG PO TABS: 10.0000 mg | ORAL_TABLET | Freq: Two times a day (BID) | ORAL | 0 refills | Status: DC

## 2022-07-22 MED ORDER — HYDROCODONE-ACETAMINOPHEN 10-325 MG PO TABS: 1.0000 | ORAL_TABLET | Freq: Four times a day (QID) | ORAL | 0 refills | Status: DC | PRN

## 2022-07-22 NOTE — Progress Notes (Signed)
Subjective:    Patient ID: [name], female    DOB: 06/05/1956, 66 y.o.   MRN: [id number]  HPI: [name] is a 66 y.o. female who returns for follow up appointment for chronic pain and medication refill. She states her  pain is located in her left ankle. She rates her pain 8. Her current exercise regime is walking and performing stretching exercises.  [name] reports she had a fall while at the [facility] a week ago, she states she lost her balance and fell forward. Her husband helped her up, she didn't seek medical attention. Educated on falls prevention she verbalizes understanding.   [name] Morphine equivalent is 40.00 MME.   UDS ordered Today.     Pain Inventory Average Pain 8 Pain Right Now 8 My pain is constant, dull, stabbing, and aching  In the last 24 hours, has pain interfered with the following? General activity 9 Relation with others 10 Enjoyment of life 10 What TIME of day is your pain at its worst? varies Sleep (in general) Poor  Pain is worse with: walking, bending, sitting, standing, and some activites Pain improves with: rest, heat/ice, pacing activities, medication, injections, and prayer Relief from Meds: 7  Family History  Problem Relation Age of Onset   Cervical cancer Mother    Diabetes Mother    Stroke Mother    Alzheimer's disease Mother    Heart attack Father    Stroke Father    Colon cancer Father    Anxiety disorder Maternal Aunt    Depression Maternal Aunt    Anxiety disorder Maternal Uncle    Depression Maternal Uncle        suicide attempt by gun shot wound to head. Survived   Alcohol abuse Other    Drug abuse Other    Breast cancer Maternal Grandmother    Diabetes Maternal Grandmother    Breast cancer Paternal Aunt    Colon polyps Sister        x 2   Social History   Socioeconomic History   Marital status: Married    Spouse name: Not on file   Number of children: 0   Years of education: Not on file   Highest  education level: Not on file  Occupational History   Occupation: disabled  Tobacco Use   Smoking status: Never   Smokeless tobacco: Never  Vaping Use   Vaping Use: Never used  Substance and Sexual Activity   Alcohol use: No    Alcohol/week: 0.0 standard drinks of alcohol   Drug use: No   Sexual activity: Yes  Other Topics Concern   Not on file  Social History Narrative   Pt has h.s. degree   Social Determinants of Health   Financial Resource Strain: Not on file  Food Insecurity: Not on file  Transportation Needs: Not on file  Physical Activity: Not on file  Stress: Not on file  Social Connections: Not on file   Past Surgical History:  Procedure Laterality Date   ABDOMINAL HYSTERECTOMY  1999   bunions removed  1988   C4 C5 cage insertion  06/28/2013   CHOLECYSTECTOMY  04/25/2008   EDG  12/17/2004   ELECTROCARDIOGRAM  05/27/2007   L5 S1 rod insertion  11/07/2012   SKIN CANCER EXCISION     SKIN CANCER EXCISION     several   [place]   Past Surgical History:  Procedure Laterality Date  ABDOMINAL HYSTERECTOMY  1999   bunions removed  1988   C4 C5 cage insertion  06/28/2013   CHOLECYSTECTOMY  04/25/2008   EDG  12/17/2004   ELECTROCARDIOGRAM  05/27/2007   L5 S1 rod insertion  11/07/2012   SKIN CANCER EXCISION     SKIN CANCER EXCISION     several   TONSILLECTOMY  1981   TUBAL LIGATION  1997   Past Medical History:  Diagnosis Date   Anemia    ANXIETY 06/10/2007   Cataract    Chronic headaches    Degenerative arthritis of spine    back and neck   Depression    DYSLIPIDEMIA 06/28/2008   Esophageal stricture    Fibromyalgia    Gallstones    GERD 06/10/2007   Hiatal hernia    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   HYPERTENSION 06/10/2007   Irritable bowel syndrome 06/28/2008   OSTEOARTHRITIS 06/10/2007   Skin cancer    basal cell   Tubular adenoma of colon    BP (!) 155/83   Pulse 66   Ht '5\' 1"'$  (1.549 m)   Wt 172 lb (78 kg)   SpO2 99%   BMI  32.50 kg/m   Opioid Risk Score:   Fall Risk Score:  `1  Depression screen PHQ 2/9     05/28/2022    2:14 PM 04/22/2022    2:12 PM 03/25/2022    1:41 PM 02/26/2022    2:03 PM 12/04/2021    1:37 PM 10/07/2021    2:03 PM 09/01/2021    2:22 PM  Depression screen PHQ 2/9  Decreased Interest 0 0 1 3 0 1 0  Down, Depressed, Hopeless 0 0 1 3 0 1 0  PHQ - 2 Score 0 0 2 6 0 2 0    Review of Systems  Musculoskeletal:  Positive for back pain.       Left leg & left ankle pain  All other systems reviewed and are negative.      Objective:   Physical Exam Vitals and nursing note reviewed.  Constitutional:      Appearance: Normal appearance.  Cardiovascular:     Rate and Rhythm: Normal rate and regular rhythm.     Pulses: Normal pulses.     Heart sounds: Normal heart sounds.  Pulmonary:     Effort: Pulmonary effort is normal.     Breath sounds: Normal breath sounds.  Musculoskeletal:     Cervical back: Normal range of motion and neck supple.     Comments: Normal Muscle Bulk and Muscle Testing Reveals:  Upper Extremities: Full ROM and Muscle Strength 5/5  Lumbar Paraspinal Tenderness: L-4-L-5 Lower Extremities: Full ROM and Muscle Strength 5/5 Arises from Table with ease Narrow Based  Gait     Skin:    General: Skin is warm and dry.  Neurological:     Mental Status: She is alert and oriented to person, place, and time.  Psychiatric:        Mood and Affect: Mood normal.        Behavior: Behavior normal.         Assessment & Plan:  1. History of chronic lumbar facet disease with spondylosis/DDD/ L4-5 spondylolisthesis.With L4-5 L5-S1 decompression stabilization:  She underwent Posterior Lumbar Iinterbody Fusion - Lumabr three-Lumbar four, on 08/01/2021 by Dr Ronnald Ramp. Neurosurgery Following 07/22/2022 Refilled: Hydrocodone '10mg'$ /325 one tablet every 6 hours as needed for pain #120. We will continue the opioid monitoring program, this consists of regular clinic visits,  examinations,  urine drug screen, pill counts as well as use of [place] Controlled Substance Reporting system. A 12 month History has been reviewed on the [place] on 07/22/2022. 2. Depression with anxiety/ PTSD:  Dr. Sima Matas Following.Continue Xanax,Abilify and Effexor. 07/22/2022 3. Cervical spondylosis: Stable at this time with no complaints. Continue Medication Regime. 07/22/2022 4. 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 Ritalin: Refilled: Ritalin10 mg  One tablet at Breakfast and Lunch. # 32. Continue with  slow weaning.  07/22/2022. 5. Muscle Spasm: Continue current medication regimen with  Flexeril: May take 1-2 tablets at HS. 07/22/2022 6. Left Lumbar Radiculitis: No complaints today. Continue HEP as Tolerated . Continue to Monitor. 07/22/2022. 7. Left Knee Osteoarthritis: No complaints today. S/P Left Knee Injection on 10/26/2018 with good relief noted.07/22/2022 8. Right Knee Pain: No complaints today. Continue to alternate with ice and heat therapy. Continue to monitor.  07/22/2022 9. Bilateral Greater Trochanter Tenderness: No complaints today. Continue to alternate Ice and Heat therapy. Continue to monitor. 07/22/2022  10. Forgetfulness:  No complaints today. Dr Sima Matas.Following.  Continue to Monitor. 07/22/2022 11. Left Ankle Pain: S/P Fall a week ago: Educated on [place] [employer] she verbalizes understanding.   53. [place] Her husband [place] her up. She didn't seek medical attention. Educated on falls prevention. She verbalizes understanding.    F/U in 1 month

## 2022-07-27 LAB — DRUG TOX MONITOR 1 W/CONF, ORAL FLD
Alprazolam: 0.82 ng/mL — ABNORMAL HIGH (ref <0.50)
Amphetamines: NEGATIVE ng/mL (ref <10)
Barbiturates: NEGATIVE ng/mL (ref <10)
Benzodiazepines: POSITIVE ng/mL — AB (ref <0.50)
Buprenorphine: NEGATIVE ng/mL (ref <0.10)
Chlordiazepoxide: NEGATIVE ng/mL (ref <0.50)
Clonazepam: NEGATIVE ng/mL (ref <0.50)
Cocaine: NEGATIVE ng/mL (ref <5.0)
Codeine: NEGATIVE ng/mL (ref <2.5)
Diazepam: NEGATIVE ng/mL (ref <0.50)
Dihydrocodeine: 21.3 ng/mL — ABNORMAL HIGH (ref <2.5)
Fentanyl: NEGATIVE ng/mL (ref <0.10)
Flunitrazepam: NEGATIVE ng/mL (ref <0.50)
Flurazepam: NEGATIVE ng/mL (ref <0.50)
Heroin Metabolite: NEGATIVE ng/mL (ref <1.0)
Hydrocodone: >250 ng/mL — ABNORMAL HIGH (ref <2.5)
Hydromorphone: NEGATIVE ng/mL (ref <2.5)
Lorazepam: NEGATIVE ng/mL (ref <0.50)
MARIJUANA: NEGATIVE ng/mL (ref <2.5)
MDMA: NEGATIVE ng/mL (ref <10)
Meprobamate: NEGATIVE ng/mL (ref <2.5)
Methadone: NEGATIVE ng/mL (ref <5.0)
Midazolam: NEGATIVE ng/mL (ref <0.50)
Morphine: NEGATIVE ng/mL (ref <2.5)
Nicotine Metabolite: NEGATIVE ng/mL (ref <5.0)
Nordiazepam: NEGATIVE ng/mL (ref <0.50)
Norhydrocodone: 13.4 ng/mL — ABNORMAL HIGH (ref <2.5)
Noroxycodone: NEGATIVE ng/mL (ref <2.5)
Opiates: POSITIVE ng/mL — AB (ref <2.5)
Oxazepam: NEGATIVE ng/mL (ref <0.50)
Oxycodone: NEGATIVE ng/mL (ref <2.5)
Oxymorphone: NEGATIVE ng/mL (ref <2.5)
Phencyclidine: NEGATIVE ng/mL (ref <10)
Tapentadol: NEGATIVE ng/mL (ref <5.0)
Temazepam: NEGATIVE ng/mL (ref <0.50)
Tramadol: NEGATIVE ng/mL (ref <5.0)
Triazolam: NEGATIVE ng/mL (ref <0.50)
Zolpidem: NEGATIVE ng/mL (ref <5.0)

## 2022-07-27 LAB — DRUG TOX METHYLPHEN W/CONF,ORAL FLD
Methylphenidate: 53.3 ng/mL — ABNORMAL HIGH (ref <1.0)
Methylphenidate: POSITIVE ng/mL — AB (ref <1.0)

## 2022-07-27 LAB — DRUG TOX ALC METAB W/CON, ORAL FLD: Alcohol Metabolite: NEGATIVE ng/mL (ref <25)

## 2022-08-03 ENCOUNTER — Telehealth: Payer: Self-pay

## 2022-08-03 ENCOUNTER — Telehealth: Payer: Self-pay | Admitting: *Deleted

## 2022-08-03 NOTE — Telephone Encounter (Signed)
[facility] to [facility] 10-325 MG in [facility]. And she wants to [place] to give her a call. [phone]).   I called her back and left a generic message to seek out another location. And call Zella Ball NP back in the morning with that location.

## 2022-08-03 NOTE — Telephone Encounter (Signed)
Oral swab drug screen was consistent for prescribed medications.  ?

## 2022-08-04 MED ORDER — HYDROCODONE-ACETAMINOPHEN 10-325 MG PO TABS: 1.0000 | ORAL_TABLET | Freq: Four times a day (QID) | ORAL | 0 refills | Status: DC | PRN

## 2022-08-04 NOTE — Telephone Encounter (Signed)
PMP was Reviewed,  [facility]codone [facility]scribed [facility]  Call placed to [name] regarding the above, she verbalizes understanding.

## 2022-08-04 NOTE — Telephone Encounter (Signed)
Pt called stating [facility] has her medication in stock.

## 2022-08-21 LAB — HM DIABETES EYE EXAM

## 2022-08-25 ENCOUNTER — Encounter: Payer: Self-pay | Admitting: Family Medicine

## 2022-08-26 ENCOUNTER — Encounter: Payer: PPO | Attending: Physical Medicine and Rehabilitation | Admitting: Registered Nurse

## 2022-08-26 ENCOUNTER — Encounter: Payer: Self-pay | Admitting: Registered Nurse

## 2022-08-26 VITALS — BP 157/84 | HR 69 | Ht 61.0 in | Wt 174.0 lb

## 2022-08-26 DIAGNOSIS — G8929 Other chronic pain: Secondary | ICD-10-CM | POA: Diagnosis present

## 2022-08-26 DIAGNOSIS — G894 Chronic pain syndrome: Secondary | ICD-10-CM | POA: Insufficient documentation

## 2022-08-26 DIAGNOSIS — Z5181 Encounter for therapeutic drug level monitoring: Secondary | ICD-10-CM | POA: Insufficient documentation

## 2022-08-26 DIAGNOSIS — Z79891 Long term (current) use of opiate analgesic: Secondary | ICD-10-CM | POA: Insufficient documentation

## 2022-08-26 DIAGNOSIS — F0781 Postconcussional syndrome: Secondary | ICD-10-CM | POA: Diagnosis present

## 2022-08-26 DIAGNOSIS — M47816 Spondylosis without myelopathy or radiculopathy, lumbar region: Secondary | ICD-10-CM | POA: Insufficient documentation

## 2022-08-26 DIAGNOSIS — M961 Postlaminectomy syndrome, not elsewhere classified: Secondary | ICD-10-CM | POA: Diagnosis not present

## 2022-08-26 DIAGNOSIS — M545 Low back pain, unspecified: Secondary | ICD-10-CM | POA: Insufficient documentation

## 2022-08-26 DIAGNOSIS — M797 Fibromyalgia: Secondary | ICD-10-CM | POA: Diagnosis not present

## 2022-08-26 MED ORDER — HYDROCODONE-ACETAMINOPHEN 10-325 MG PO TABS: 1.0000 | ORAL_TABLET | Freq: Four times a day (QID) | ORAL | 0 refills | Status: DC | PRN

## 2022-08-26 MED ORDER — METHYLPHENIDATE HCL 10 MG PO TABS: 10.0000 mg | ORAL_TABLET | Freq: Two times a day (BID) | ORAL | 0 refills | Status: DC

## 2022-08-26 MED ORDER — ALPRAZOLAM 1 MG PO TABS: ORAL_TABLET | ORAL | 3 refills | Status: DC

## 2022-08-26 NOTE — Progress Notes (Signed)
Subjective:    Patient ID: [name], female    DOB: 1955/10/31, 66 y.o.   MRN: [id number]  HPI: [name] is a 66 y.o. female who returns for follow up appointment for chronic pain and medication refill. She states her pain is located in her lower back pain. She rates  her pain 7. Her current exercise regime is walking and performing stretching exercises.  Mr. Fiorentino Morphine equivalent is 40.00 MME.  Last Oral Swab was Performed on 07/22/2022, it was consistent.       Pain Inventory Average Pain 6 Pain Right Now 7 My pain is constant, burning, dull, stabbing, tingling, and aching  In the last 24 hours, has pain interfered with the following? General activity 9 Relation with others 9 Enjoyment of life 8 What TIME of day is your pain at its worst? morning , daytime, evening, night, and varies Sleep (in general) Poor  Pain is worse with: walking, bending, sitting, standing, unsure, and some activites Pain improves with: rest, medication, injections, and heat Relief from Meds: 7  Family History  Problem Relation Age of Onset   Cervical cancer Mother    Diabetes Mother    Stroke Mother    Alzheimer's disease Mother    Heart attack Father    Stroke Father    Colon cancer Father    Anxiety disorder Maternal Aunt    Depression Maternal Aunt    Anxiety disorder Maternal Uncle    Depression Maternal Uncle        suicide attempt by gun shot wound to head. Survived   Alcohol abuse Other    Drug abuse Other    Breast cancer Maternal Grandmother    Diabetes Maternal Grandmother    Breast cancer Paternal Aunt    Colon polyps Sister        x 2   Social History   Socioeconomic History   Marital status: Married    Spouse name: Not on file   Number of children: 0   Years of education: Not on file   Highest education level: Not on file  Occupational History   Occupation: disabled  Tobacco Use   Smoking status: Never   Smokeless tobacco: Never  Vaping Use    Vaping Use: Never used  Substance and Sexual Activity   Alcohol use: No    Alcohol/week: 0.0 standard drinks of alcohol   Drug use: No   Sexual activity: Yes  Other Topics Concern   Not on file  Social History Narrative   Pt has h.s. degree   Social Determinants of Health   Financial Resource Strain: Not on file  Food Insecurity: Not on file  Transportation Needs: Not on file  Physical Activity: Not on file  Stress: Not on file  Social Connections: Not on file   Past Surgical History:  Procedure Laterality Date   ABDOMINAL HYSTERECTOMY  1999   bunions removed  1988   C4 C5 cage insertion  06/28/2013   CHOLECYSTECTOMY  04/25/2008   EDG  12/17/2004   ELECTROCARDIOGRAM  05/27/2007   L5 S1 rod insertion  11/07/2012   SKIN CANCER EXCISION     SKIN CANCER EXCISION     several   [place]   Past Surgical History:  Procedure Laterality Date   ABDOMINAL HYSTERECTOMY  1999   bunions removed  1988   C4 C5 cage insertion  06/28/2013   CHOLECYSTECTOMY  04/25/2008   EDG  12/17/2004   ELECTROCARDIOGRAM  05/27/2007   L5 S1 rod insertion  11/07/2012   SKIN CANCER EXCISION     SKIN CANCER EXCISION     several   TONSILLECTOMY  1981   TUBAL LIGATION  1997   Past Medical History:  Diagnosis Date   Anemia    ANXIETY 06/10/2007   Cataract    Chronic headaches    Degenerative arthritis of spine    back and neck   Depression    DYSLIPIDEMIA 06/28/2008   Esophageal stricture    Fibromyalgia    Gallstones    GERD 06/10/2007   Hiatal hernia    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   HYPERTENSION 06/10/2007   Irritable bowel syndrome 06/28/2008   OSTEOARTHRITIS 06/10/2007   Skin cancer    basal cell   Tubular adenoma of colon    BP (!) 158/88   Pulse 69   Ht '5\' 1"'$  (1.549 m)   Wt 174 lb (78.9 kg)   SpO2 99%   BMI 32.88 kg/m   Opioid Risk Score:   Fall Risk Score:  `1  Depression screen PHQ 2/9     08/26/2022    2:12 PM 05/28/2022    2:14 PM 04/22/2022    2:12  PM 03/25/2022    1:41 PM 02/26/2022    2:03 PM 12/04/2021    1:37 PM 10/07/2021    2:03 PM  Depression screen PHQ 2/9  Decreased Interest 1 0 0 1 3 0 1  Down, Depressed, Hopeless 1 0 0 1 3 0 1  PHQ - 2 Score 2 0 0 2 6 0 2    Review of Systems  Musculoskeletal:        Pain in right knee & right leg, left  buttock pain  All other systems reviewed and are negative.      Objective:   Physical Exam Vitals and nursing note reviewed.  Constitutional:      Appearance: Normal appearance.  Cardiovascular:     Rate and Rhythm: Normal rate and regular rhythm.     Pulses: Normal pulses.     Heart sounds: Normal heart sounds.  Pulmonary:     Effort: Pulmonary effort is normal.     Breath sounds: Normal breath sounds.  Musculoskeletal:     Cervical back: Normal range of motion and neck supple.     Comments: Normal Muscle Bulk and Muscle Testing Reveals:  Upper Extremities: Full ROM and Muscle Strength 5/5  Lower Extremities: Full ROM and Muscle Strength 5/5 Arises from chair with ease Narrow Based  Gait     Skin:    General: Skin is warm and dry.  Neurological:     Mental Status: She is alert and oriented to person, place, and time.  Psychiatric:        Mood and Affect: Mood normal.        Behavior: Behavior normal.         Assessment & Plan:  1. History of chronic lumbar facet disease with spondylosis/DDD/ L4-5 spondylolisthesis.With L4-5 L5-S1 decompression stabilization:  She underwent Posterior Lumbar Iinterbody Fusion - Lumabr three-Lumbar four, on 08/01/2021 by Dr Ronnald Ramp. Neurosurgery Following 08/26/2022 Refilled: Hydrocodone '10mg'$ /325 one tablet every 6 hours as needed for pain #120.Begin slow weaning: Goal to decrease Hydrocodone to three times a day as needed for pain.  We will continue the opioid monitoring program, this consists of regular clinic visits, examinations, urine drug screen, pill counts as well as use of [place] Controlled Substance Reporting  system. A  12 month History has been reviewed on the [place] on 08/26/2022. 2. Depression with anxiety/ PTSD:  Dr. Sima Matas Following.Continue Xanax,Abilify and Effexor. 08/26/2022 3. Cervical spondylosis: Stable at this time with no complaints. Continue Medication Regime. 08/26/2022 4. 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 Ritalin: Refilled: Ritalin10 mg  One tablet at Breakfast and Lunch. # 36. Continue with  slow weaning.  08/26/2022. 5. Muscle Spasm: Continue current medication regimen with  Flexeril: May take 1-2 tablets at HS. 08/26/2022 6. Left Lumbar Radiculitis: No complaints today. Continue HEP as Tolerated . Continue to Monitor. 08/26/2022. 7. Left Knee Osteoarthritis: No complaints today. S/P Left Knee Injection on 10/26/2018 with good relief noted.08/26/2022 8. Right Knee Pain: No complaints today. Continue to alternate with ice and heat therapy. Continue to monitor.  08/26/2022 9. Bilateral Greater Trochanter Tenderness: No complaints today. Continue to alternate Ice and Heat therapy. Continue to monitor. 08/26/2022  10. Forgetfulness:  No complaints today. Dr Sima Matas.Following.  Continue to Monitor. 08/26/2022 11. Left Ankle Pain: No complaints today. No Falls since last visit. Continue to Monitor. 11`/15/2023     F/U in 1 month

## 2022-08-27 ENCOUNTER — Encounter: Payer: Self-pay | Admitting: Family Medicine

## 2022-09-24 ENCOUNTER — Encounter: Payer: PPO | Attending: Physical Medicine and Rehabilitation | Admitting: Registered Nurse

## 2022-09-24 VITALS — BP 161/91 | HR 65 | Ht 61.0 in | Wt 165.0 lb

## 2022-09-24 DIAGNOSIS — M4326 Fusion of spine, lumbar region: Secondary | ICD-10-CM | POA: Insufficient documentation

## 2022-09-24 DIAGNOSIS — Z79891 Long term (current) use of opiate analgesic: Secondary | ICD-10-CM | POA: Diagnosis not present

## 2022-09-24 DIAGNOSIS — Z5181 Encounter for therapeutic drug level monitoring: Secondary | ICD-10-CM | POA: Diagnosis not present

## 2022-09-24 DIAGNOSIS — F0781 Postconcussional syndrome: Secondary | ICD-10-CM

## 2022-09-24 DIAGNOSIS — G8929 Other chronic pain: Secondary | ICD-10-CM

## 2022-09-24 DIAGNOSIS — M545 Low back pain, unspecified: Secondary | ICD-10-CM | POA: Diagnosis not present

## 2022-09-24 DIAGNOSIS — M47816 Spondylosis without myelopathy or radiculopathy, lumbar region: Secondary | ICD-10-CM

## 2022-09-24 DIAGNOSIS — Z760 Encounter for issue of repeat prescription: Secondary | ICD-10-CM | POA: Insufficient documentation

## 2022-09-24 DIAGNOSIS — F4310 Post-traumatic stress disorder, unspecified: Secondary | ICD-10-CM | POA: Insufficient documentation

## 2022-09-24 DIAGNOSIS — M1712 Unilateral primary osteoarthritis, left knee: Secondary | ICD-10-CM | POA: Diagnosis not present

## 2022-09-24 DIAGNOSIS — M961 Postlaminectomy syndrome, not elsewhere classified: Secondary | ICD-10-CM | POA: Diagnosis not present

## 2022-09-24 DIAGNOSIS — W19XXXD Unspecified fall, subsequent encounter: Secondary | ICD-10-CM | POA: Diagnosis not present

## 2022-09-24 DIAGNOSIS — M25572 Pain in left ankle and joints of left foot: Secondary | ICD-10-CM | POA: Diagnosis not present

## 2022-09-24 DIAGNOSIS — G894 Chronic pain syndrome: Secondary | ICD-10-CM

## 2022-09-24 DIAGNOSIS — M62838 Other muscle spasm: Secondary | ICD-10-CM | POA: Insufficient documentation

## 2022-09-24 DIAGNOSIS — F32A Depression, unspecified: Secondary | ICD-10-CM | POA: Diagnosis not present

## 2022-09-24 DIAGNOSIS — M47812 Spondylosis without myelopathy or radiculopathy, cervical region: Secondary | ICD-10-CM | POA: Diagnosis not present

## 2022-09-24 DIAGNOSIS — M797 Fibromyalgia: Secondary | ICD-10-CM | POA: Diagnosis not present

## 2022-09-24 MED ORDER — HYDROCODONE-ACETAMINOPHEN 10-325 MG PO TABS: 1.0000 | ORAL_TABLET | Freq: Four times a day (QID) | ORAL | 0 refills | Status: DC | PRN

## 2022-09-24 MED ORDER — METHYLPHENIDATE HCL 10 MG PO TABS: 10.0000 mg | ORAL_TABLET | Freq: Two times a day (BID) | ORAL | 0 refills | Status: DC

## 2022-09-24 NOTE — Progress Notes (Signed)
Subjective:    Patient ID: [name], female    DOB: 19-May-1956, 66 y.o.   MRN: [id number]  HPI: [name] is a 66 y.o. female who returns for follow up appointment for chronic pain and medication refill. She states her pain is located in her lower back. She rates her pain 7. Her current exercise regime is walking and performing stretching exercises.  [name] reports three weeks ago she was at the [place], it was raining and she was carrying her dog, she slipped on a rock and fell forward landing on her knees. Her husband helped her up. She was educated on Falls prevention, she verbalizes understanding.   [name] Morphine equivalent is 40.00 MME.   Last UDS was Performed on 07/22/2022, it was consistent.    Pain Inventory Average Pain 8 Pain Right Now 7 My pain is sharp, burning, stabbing, and aching  In the last 24 hours, has pain interfered with the following? General activity 6 Relation with others 8 Enjoyment of life 8 What TIME of day is your pain at its worst? varies Sleep (in general) Poor  Pain is worse with: walking, bending, sitting, standing, and some activites Pain improves with: rest, heat/ice, medication, and injections Relief from Meds: 0  Family History  Problem Relation Age of Onset   Cervical cancer Mother    Diabetes Mother    Stroke Mother    Alzheimer's disease Mother    Heart attack Father    Stroke Father    Colon cancer Father    Anxiety disorder Maternal Aunt    Depression Maternal Aunt    Anxiety disorder Maternal Uncle    Depression Maternal Uncle        suicide attempt by gun shot wound to head. Survived   Alcohol abuse Other    Drug abuse Other    Breast cancer Maternal Grandmother    Diabetes Maternal Grandmother    Breast cancer Paternal Aunt    Colon polyps Sister        x 2   Social History   Socioeconomic History   Marital status: Married    Spouse name: Not on file   Number of children: 0   Years of education:  Not on file   Highest education level: Not on file  Occupational History   Occupation: disabled  Tobacco Use   Smoking status: Never   Smokeless tobacco: Never  Vaping Use   Vaping Use: Never used  Substance and Sexual Activity   Alcohol use: No    Alcohol/week: 0.0 standard drinks of alcohol   Drug use: No   Sexual activity: Yes  Other Topics Concern   Not on file  Social History Narrative   Pt has h.s. degree   Social Determinants of Health   Financial Resource Strain: Not on file  Food Insecurity: Not on file  Transportation Needs: Not on file  Physical Activity: Not on file  Stress: Not on file  Social Connections: Not on file   Past Surgical History:  Procedure Laterality Date   ABDOMINAL HYSTERECTOMY  1999   bunions removed  1988   C4 C5 cage insertion  06/28/2013   CHOLECYSTECTOMY  04/25/2008   EDG  12/17/2004   ELECTROCARDIOGRAM  05/27/2007   L5 S1 rod insertion  11/07/2012   SKIN CANCER EXCISION     SKIN CANCER EXCISION     several   [name]   Past  Surgical History:  Procedure Laterality Date   ABDOMINAL HYSTERECTOMY  1999   bunions removed  1988   C4 C5 cage insertion  06/28/2013   CHOLECYSTECTOMY  04/25/2008   EDG  12/17/2004   ELECTROCARDIOGRAM  05/27/2007   L5 S1 rod insertion  11/07/2012   SKIN CANCER EXCISION     SKIN CANCER EXCISION     several   TONSILLECTOMY  1981   TUBAL LIGATION  1997   Past Medical History:  Diagnosis Date   Anemia    ANXIETY 06/10/2007   Cataract    Chronic headaches    Degenerative arthritis of spine    back and neck   Depression    DYSLIPIDEMIA 06/28/2008   Esophageal stricture    Fibromyalgia    Gallstones    GERD 06/10/2007   Hiatal hernia    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   HYPERTENSION 06/10/2007   Irritable bowel syndrome 06/28/2008   OSTEOARTHRITIS 06/10/2007   Skin cancer    basal cell   Tubular adenoma of colon    BP (!) 161/91   Pulse 65   Ht '5\' 1"'$  (1.549 m)   Wt 165 lb (74.8  kg)   SpO2 99%   BMI 31.18 kg/m   Opioid Risk Score:   Fall Risk Score:  `1  Depression screen PHQ 2/9     08/26/2022    2:12 PM 05/28/2022    2:14 PM 04/22/2022    2:12 PM 03/25/2022    1:41 PM 02/26/2022    2:03 PM 12/04/2021    1:37 PM 10/07/2021    2:03 PM  Depression screen PHQ 2/9  Decreased Interest 1 0 0 1 3 0 1  Down, Depressed, Hopeless 1 0 0 1 3 0 1  PHQ - 2 Score 2 0 0 2 6 0 2      Review of Systems  Musculoskeletal:        Bilateral thigh pain Bilateral knee pain Left ankle pain Buttocks pain  All other systems reviewed and are negative.     Objective:   Physical Exam Vitals and nursing note reviewed.  Constitutional:      Appearance: Normal appearance.  Cardiovascular:     Rate and Rhythm: Normal rate and regular rhythm.     Pulses: Normal pulses.     Heart sounds: Normal heart sounds.  Pulmonary:     Effort: Pulmonary effort is normal.     Breath sounds: Normal breath sounds.  Musculoskeletal:     Cervical back: Normal range of motion and neck supple.     Comments: Normal Muscle Bulk and Muscle Testing Reveals:  Upper Extremities: Full ROM and Muscle Strength 5/5 Lumbar Paraspinal tenderness: L-4-L-5 Lower Extremities: Full ROM and Muscle Strength 5/5 Arises from Chair with ease Narrow Based  Gait     Skin:    General: Skin is warm and dry.  Neurological:     Mental Status: She is alert and oriented to person, place, and time.  Psychiatric:        Mood and Affect: Mood normal.        Behavior: Behavior normal.         Assessment & Plan:  1. History of chronic lumbar facet disease with spondylosis/DDD/ L4-5 spondylolisthesis.With L4-5 L5-S1 decompression stabilization:  She underwent Posterior Lumbar Iinterbody Fusion - Lumabr three-Lumbar four, on 08/01/2021 by Dr Ronnald Ramp. Neurosurgery Following 09/24/2022 Refilled: Hydrocodone '10mg'$ /325 one tablet every 6 hours as needed for pain #120.Begin slow weaning: Goal to decrease Hydrocodone  to  three times a day as needed for pain.  We will continue the opioid monitoring program, this consists of regular clinic visits, examinations, urine drug screen, pill counts as well as use of [place] Controlled Substance Reporting system. A 12 month History has been reviewed on the [place] on 09/24/2022. 2. Depression with anxiety/ PTSD:  Dr. Sima Matas Following.Continue Xanax,Abilify and Effexor. 09/24/2022 3. Cervical spondylosis: Stable at this time with no complaints. Continue Medication Regime. 09/24/2022 4. 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 Ritalin: Refilled: Ritalin10 mg  One tablet at Breakfast and Lunch. # 60. Continue with  slow weaning.  09/24/2022. 5. Muscle Spasm: Continue current medication regimen with  Flexeril: May take 1-2 tablets at HS. 09/24/2022 6. Left Lumbar Radiculitis: No complaints today. Continue HEP as Tolerated . Continue to Monitor. 09/24/2022. 7. Left Knee Osteoarthritis: No complaints today. S/P Left Knee Injection on 10/26/2018 with good relief noted.09/24/2022 8. Right Knee Pain: No complaints today. Continue to alternate with ice and heat therapy. Continue to monitor.  09/24/2022 9. Bilateral Greater Trochanter Tenderness: No complaints today. Continue to alternate Ice and Heat therapy. Continue to monitor. 09/24/2022  10. Forgetfulness:  No complaints today. Dr Sima Matas.Following.  Continue to Monitor. 09/24/2022 11. Left Ankle Pain: No complaints today. No Falls since last visit. Continue to Monitor. 09/24/2022  13. Fall Subsequent encounter: Educated on Falls Prevention: She verbalizes understanding.     F/U in 1 month

## 2022-09-28 ENCOUNTER — Telehealth: Payer: Self-pay

## 2022-09-28 MED ORDER — HYDROCODONE-ACETAMINOPHEN 10-325 MG PO TABS: 1.0000 | ORAL_TABLET | Freq: Four times a day (QID) | ORAL | 0 refills | Status: DC | PRN

## 2022-09-28 NOTE — Telephone Encounter (Signed)
[facility], [facility] [address] has Hydro[facility]one in stock and she would like it sent there.

## 2022-09-28 NOTE — Telephone Encounter (Signed)
PMP was Reviewed.  Hydrocodone e-scribed to [facility].  [name] is aware of the above.

## 2022-10-26 ENCOUNTER — Encounter: Payer: PPO | Attending: Physical Medicine and Rehabilitation | Admitting: Registered Nurse

## 2022-10-26 ENCOUNTER — Other Ambulatory Visit: Payer: Self-pay | Admitting: Registered Nurse

## 2022-10-26 ENCOUNTER — Encounter: Payer: Self-pay | Admitting: Registered Nurse

## 2022-10-26 VITALS — BP 112/72 | HR 70 | Ht 61.0 in | Wt 170.0 lb

## 2022-10-26 DIAGNOSIS — M961 Postlaminectomy syndrome, not elsewhere classified: Secondary | ICD-10-CM | POA: Insufficient documentation

## 2022-10-26 DIAGNOSIS — M545 Low back pain, unspecified: Secondary | ICD-10-CM | POA: Diagnosis not present

## 2022-10-26 DIAGNOSIS — M47816 Spondylosis without myelopathy or radiculopathy, lumbar region: Secondary | ICD-10-CM | POA: Diagnosis not present

## 2022-10-26 DIAGNOSIS — M797 Fibromyalgia: Secondary | ICD-10-CM | POA: Diagnosis present

## 2022-10-26 DIAGNOSIS — F0781 Postconcussional syndrome: Secondary | ICD-10-CM | POA: Insufficient documentation

## 2022-10-26 DIAGNOSIS — Z79891 Long term (current) use of opiate analgesic: Secondary | ICD-10-CM | POA: Diagnosis present

## 2022-10-26 DIAGNOSIS — G894 Chronic pain syndrome: Secondary | ICD-10-CM | POA: Insufficient documentation

## 2022-10-26 DIAGNOSIS — Z5181 Encounter for therapeutic drug level monitoring: Secondary | ICD-10-CM | POA: Insufficient documentation

## 2022-10-26 DIAGNOSIS — G8929 Other chronic pain: Secondary | ICD-10-CM | POA: Diagnosis present

## 2022-10-26 MED ORDER — HYDROCODONE-ACETAMINOPHEN 10-325 MG PO TABS: 1.0000 | ORAL_TABLET | Freq: Four times a day (QID) | ORAL | 0 refills | Status: DC | PRN

## 2022-10-26 MED ORDER — METHYLPHENIDATE HCL 10 MG PO TABS: 10.0000 mg | ORAL_TABLET | Freq: Two times a day (BID) | ORAL | 0 refills | Status: DC
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
## 2022-10-30 ENCOUNTER — Other Ambulatory Visit: Payer: Self-pay | Admitting: Registered Nurse

## 2022-10-30 ENCOUNTER — Other Ambulatory Visit: Payer: Self-pay

## 2022-10-30 MED ORDER — ALPRAZOLAM 1 MG PO TABS: ORAL_TABLET | ORAL | 3 refills | Status: DC

## 2022-10-30 NOTE — Telephone Encounter (Signed)
Patient called and said pharmacy told her the refills expired and a new one will need to be sent.

## 2022-10-30 NOTE — Telephone Encounter (Signed)
PMP was Reviewed.  Alprazolam e- scribed today.  [name] is aware of the above.

## 2022-11-17 ENCOUNTER — Other Ambulatory Visit: Payer: Self-pay | Admitting: Registered Nurse

## 2022-11-17 NOTE — Telephone Encounter (Signed)
Effexor prescription sent to pharmacy.

## 2022-12-01 ENCOUNTER — Telehealth: Payer: Self-pay

## 2022-12-01 ENCOUNTER — Encounter: Payer: PPO | Admitting: Registered Nurse

## 2022-12-01 DIAGNOSIS — F0781 Postconcussional syndrome: Secondary | ICD-10-CM

## 2022-12-01 MED ORDER — HYDROCODONE-ACETAMINOPHEN 10-325 MG PO TABS: 1.0000 | ORAL_TABLET | Freq: Four times a day (QID) | ORAL | 0 refills | Status: DC | PRN

## 2022-12-01 MED ORDER — ALPRAZOLAM 1 MG PO TABS: ORAL_TABLET | ORAL | 3 refills | Status: DC

## 2022-12-01 MED ORDER — METHYLPHENIDATE HCL 10 MG PO TABS: 10.0000 mg | ORAL_TABLET | Freq: Two times a day (BID) | ORAL | 0 refills | Status: DC

## 2022-12-01 NOTE — Telephone Encounter (Signed)
LVM that rx has been submitted to pharmacy.

## 2022-12-01 NOTE — Telephone Encounter (Signed)
Rx written and sent to the pharmacy. Thanks!

## 2022-12-10 NOTE — Progress Notes (Signed)
Cardiology Office Note:    Date:  12/11/2022   ID:  RHODIE OLINDE, DOB [birth year], MRN [id number]  PCP:  Bernerd Limbo, MD   [facility] Providers Cardiologist:  Janina Mayo, MD     Referring MD: Bernerd Limbo, MD   No chief complaint on file. DOE  History of Present Illness:    [name] is a 67 y.o. female with a hx of GERD, HTN, fibromyalgia, depression - had prior normal lexiscan stress 09/20/2014, TTE 09/19/2014 EF is normal. Cardiac monitor in 03/2018 showing NSR with sinus bradycardia and sinus tachycardia, rare PACs. Referral for DOE. She notes for a year, she cannot catch her breath. She can feel dizzy. Sometimes things go black. Then her sight comes back. She feels short of breath with activity.  Notes R breast pain. Father had an MI and died of a stroke. Mother had strokes x3. GM had heart disease. No smoking. No LE edema. No orthopnea or PND. No sleep apnea.   Past Medical History:  Diagnosis Date   Anemia    ANXIETY 06/10/2007   Cataract    Chronic headaches    Degenerative arthritis of spine    back and neck   Depression    DYSLIPIDEMIA 06/28/2008   Esophageal stricture    Fibromyalgia    Gallstones    GERD 06/10/2007   Hiatal hernia    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   HYPERTENSION 06/10/2007   Irritable bowel syndrome 06/28/2008   OSTEOARTHRITIS 06/10/2007   Skin cancer    basal cell   Tubular adenoma of colon     Past Surgical History:  Procedure Laterality Date   ABDOMINAL HYSTERECTOMY  1999   bunions removed  1988   C4 C5 cage insertion  06/28/2013   CHOLECYSTECTOMY  04/25/2008   EDG  12/17/2004   ELECTROCARDIOGRAM  05/27/2007   L5 S1 rod insertion  11/07/2012   SKIN CANCER EXCISION     SKIN CANCER EXCISION     several   TONSILLECTOMY  1981   TUBAL LIGATION  1997    Current Medications: Current Outpatient Medications on File Prior to Visit  Medication Sig Dispense Refill   acyclovir (ZOVIRAX) 800 MG tablet Take by mouth.     ALPRAZolam  (XANAX) 1 MG tablet TAKE 1/2 TAB BY MOUTH 3 TIMES A DAY AS NEEDED FOR ANXIETY 45 tablet 3   ARIPiprazole (ABILIFY) 2 MG tablet TAKE 1 TABLET BY MOUTH EVERY DAY 90 tablet 1   atorvastatin (LIPITOR) 80 MG tablet Take 1 tablet by mouth daily.     famotidine (PEPCID) 20 MG tablet Take 20 mg by mouth at bedtime.     HYDROcodone-acetaminophen (NORCO) 10-325 MG tablet Take 1 tablet by mouth every 6 (six) hours as needed. 120 tablet 0   irbesartan (AVAPRO) 75 MG tablet Take 1 tablet (75 mg total) by mouth daily. 90 tablet 3   MAXITROL 3.5-10000-0.1 OINT SMARTSIG:1 sparingly In Eye(s) Twice Daily     methylphenidate (RITALIN) 10 MG tablet Take 1 tablet (10 mg total) by mouth 2 (two) times daily with breakfast and lunch. [id number]. 45 tablet 0   omeprazole (PRILOSEC) 40 MG capsule Take by mouth.     propranolol (INDERAL) 40 MG tablet Take 1 tablet by mouth 2 (two) times daily.     RESTASIS 0.05 % ophthalmic emulsion Place 1 drop into both eyes 2 (two) times daily.     venlafaxine XR (EFFEXOR-XR) 150 MG 24 hr capsule TAKE ONE CAPSULE DAILY WITH  BREAKFAST. 90 capsule 3   No current facility-administered medications on file prior to visit.     Allergies:   Gabapentin, Poison ivy extract [poison ivy extract], Poison oak extract [poison oak extract], and Poison sumac extract   Social History   Socioeconomic History   Marital status: Married    Spouse name: Not on file   Number of children: 0   Years of education: Not on file   Highest education level: Not on file  Occupational History   Occupation: disabled  Tobacco Use   Smoking status: Never   Smokeless tobacco: Never  Vaping Use   Vaping Use: Never used  Substance and Sexual Activity   Alcohol use: No    Alcohol/week: 0.0 standard drinks of alcohol   Drug use: No   Sexual activity: Yes  Other Topics Concern   Not on file  Social History Narrative   Pt has h.s. degree   Social Determinants of Health   Financial Resource Strain: Not  on file  Food Insecurity: Not on file  Transportation Needs: Not on file  Physical Activity: Not on file  Stress: Not on file  Social Connections: Not on file     Family History: The patient's family history includes Alcohol abuse in an other family member; Alzheimer's disease in her mother; Anxiety disorder in her maternal aunt and maternal uncle; Breast cancer in her maternal grandmother and paternal aunt; Cervical cancer in her mother; Colon cancer in her father; Colon polyps in her sister; Depression in her maternal aunt and maternal uncle; Diabetes in her maternal grandmother and mother; Drug abuse in an other family member; Heart attack in her father; Stroke in her father and mother.  ROS:   Please see the history of present illness.     All other systems reviewed and are negative.  EKGs/Labs/Other Studies Reviewed:    The following studies were reviewed today:   EKG:  EKG is  ordered today.  The ekg ordered today demonstrates   12/11/2022- sinus tachycardia HR 101 bpm  Recent Labs: No results found for requested labs within last 365 days.   Recent Lipid Panel    Component Value Date/Time   CHOL 257 (H) 04/24/2020 1407   TRIG 93 10/24/2020 1239   HDL 44.10 04/24/2020 1407   CHOLHDL 6 04/24/2020 1407   VLDL 58.8 (H) 10/24/2019 1513   LDLCALC 78 08/04/2016 1057   LDLDIRECT 111.0 04/24/2020 1407     Risk Assessment/Calculations:     Physical Exam:    VS:   Vitals:   12/11/22 1353  BP: 124/68  Pulse: (!) 101  SpO2: 98%     Wt Readings from Last 3 Encounters:  12/11/22 174 lb 3.2 oz (79 kg)  10/26/22 170 lb (77.1 kg)  09/24/22 165 lb (74.8 kg)     GEN:  Well nourished, well developed in no acute distress HEENT: Normal NECK: No JVD; No carotid bruits LYMPHATICS: No lymphadenopathy CARDIAC: RRR, no murmurs, rubs, gallops RESPIRATORY:  Clear to auscultation without rales, wheezing or rhonchi  ABDOMEN: Soft, non-tender, non-distended MUSCULOSKELETAL:  No  edema; No deformity  SKIN: Warm and dry NEUROLOGIC:  Alert and oriented x 3 PSYCHIATRIC:  Normal affect   ASSESSMENT:   DOE: TTE. Low suspicion of CAD without typical symptoms and no significant CVD risk factors. EKG poor r wave progression likely related to breast tissue. No murmurs. No signs of CHF. We discussed overall depression and chronic pain can contribute to her overall symptoms.  PLAN:    In order of problems listed above:  TTE Follow up PRN  Medication Adjustments/Labs and Tests Ordered: Current medicines are reviewed at length with the patient today.  Concerns regarding medicines are outlined above.  Orders Placed This Encounter  Procedures   EKG 12-Lead   ECHOCARDIOGRAM COMPLETE   No orders of the defined types were placed in this encounter.   Patient Instructions  Medication Instructions:  No Changes In Medications at this time.  *If you need a refill on your cardiac medications before your next appointment, please call your pharmacy*  Lab Work: None Ordered At This Time.  If you have labs (blood work) drawn today and your tests are completely normal, you will receive your results only by: [place] (if you have MyChart) OR A paper copy in the mail If you have any lab test that is abnormal or we need to change your treatment, we will call you to review the results.  Testing/Procedures: Your physician has requested that you have an echocardiogram. Echocardiography is a painless test that uses sound waves to create images of your heart. It provides your doctor with information about the size and shape of your heart and how well your heart's chambers and valves are working. You may receive an ultrasound enhancing agent through an IV if needed to better visualize your heart during the echo.This procedure takes approximately one hour. There are no restrictions for this procedure. This will take place at the [address].   Follow-Up: At [facility], you and your health needs are our priority.  As part of our continuing mission to provide you with exceptional heart care, we have created designated Provider Care Teams.  These Care Teams include your primary Cardiologist (physician) and Advanced Practice Providers (APPs -  Physician Assistants and Nurse Practitioners) who all work together to provide you with the care you need, when you need it.  Your next appointment:   AS NEEDED   Provider:   Janina Mayo, MD      Signed, Janina Mayo, MD  12/11/2022 5:15 PM    [place]

## 2022-12-11 ENCOUNTER — Ambulatory Visit: Payer: PPO | Attending: Internal Medicine | Admitting: Internal Medicine

## 2022-12-11 ENCOUNTER — Encounter: Payer: Self-pay | Admitting: Internal Medicine

## 2022-12-11 VITALS — BP 124/68 | HR 101 | Ht 61.0 in | Wt 174.2 lb

## 2022-12-11 DIAGNOSIS — R0600 Dyspnea, unspecified: Secondary | ICD-10-CM

## 2022-12-11 NOTE — Patient Instructions (Signed)
Medication Instructions:  No Changes In Medications at this time.  *If you need a refill on your cardiac medications before your next appointment, please call your pharmacy*  Lab Work: None Ordered At This Time.  If you have labs (blood work) drawn today and your tests are completely normal, you will receive your results only by: [place] (if you have MyChart) OR A paper copy in the mail If you have any lab test that is abnormal or we need to change your treatment, we will call you to review the results.  Testing/Procedures: Your physician has requested that you have an echocardiogram. Echocardiography is a painless test that uses sound waves to create images of your heart. It provides your doctor with information about the size and shape of your heart and how well your heart's chambers and valves are working. You may receive an ultrasound enhancing agent through an IV if needed to better visualize your heart during the echo.This procedure takes approximately one hour. There are no restrictions for this procedure. This will take place at the [address], [address].   Follow-Up: At [facility], you and your health needs are our priority.  As part of our continuing mission to provide you with exceptional heart care, we have created designated Provider Care Teams.  These Care Teams include your primary Cardiologist (physician) and Advanced Practice Providers (APPs -  Physician Assistants and Nurse Practitioners) who all work together to provide you with the care you need, when you need it.  Your next appointment:   AS NEEDED   Provider:   Janina Mayo, MD

## 2022-12-21 ENCOUNTER — Other Ambulatory Visit: Payer: Self-pay | Admitting: Family Medicine

## 2022-12-21 DIAGNOSIS — Z1231 Encounter for screening mammogram for malignant neoplasm of breast: Secondary | ICD-10-CM

## 2022-12-30 ENCOUNTER — Ambulatory Visit
Admission: RE | Admit: 2022-12-30 | Discharge: 2022-12-30 | Disposition: A | Discharge status: Home / Self Care | Payer: PPO | Source: Ambulatory Visit | Attending: Physical Medicine & Rehabilitation | Admitting: Physical Medicine & Rehabilitation

## 2022-12-30 ENCOUNTER — Encounter: Payer: Self-pay | Admitting: Physical Medicine & Rehabilitation

## 2022-12-30 ENCOUNTER — Encounter: Payer: PPO | Attending: Physical Medicine and Rehabilitation | Admitting: Physical Medicine & Rehabilitation

## 2022-12-30 VITALS — BP 134/77 | HR 60 | Ht 61.0 in | Wt 175.0 lb

## 2022-12-30 DIAGNOSIS — Z79891 Long term (current) use of opiate analgesic: Secondary | ICD-10-CM | POA: Diagnosis not present

## 2022-12-30 DIAGNOSIS — F0781 Postconcussional syndrome: Secondary | ICD-10-CM | POA: Diagnosis present

## 2022-12-30 DIAGNOSIS — M1712 Unilateral primary osteoarthritis, left knee: Secondary | ICD-10-CM | POA: Diagnosis present

## 2022-12-30 DIAGNOSIS — G894 Chronic pain syndrome: Secondary | ICD-10-CM | POA: Insufficient documentation

## 2022-12-30 DIAGNOSIS — M1711 Unilateral primary osteoarthritis, right knee: Secondary | ICD-10-CM | POA: Diagnosis present

## 2022-12-30 DIAGNOSIS — M25561 Pain in right knee: Secondary | ICD-10-CM

## 2022-12-30 DIAGNOSIS — M47817 Spondylosis without myelopathy or radiculopathy, lumbosacral region: Secondary | ICD-10-CM | POA: Insufficient documentation

## 2022-12-30 DIAGNOSIS — G8929 Other chronic pain: Secondary | ICD-10-CM | POA: Diagnosis present

## 2022-12-30 DIAGNOSIS — Z5181 Encounter for therapeutic drug level monitoring: Secondary | ICD-10-CM | POA: Insufficient documentation

## 2022-12-30 DIAGNOSIS — M25562 Pain in left knee: Secondary | ICD-10-CM | POA: Insufficient documentation

## 2022-12-30 DIAGNOSIS — M47812 Spondylosis without myelopathy or radiculopathy, cervical region: Secondary | ICD-10-CM | POA: Insufficient documentation

## 2022-12-30 MED ORDER — METHYLPHENIDATE HCL 10 MG PO TABS: 10.0000 mg | ORAL_TABLET | Freq: Two times a day (BID) | ORAL | 0 refills | Status: DC

## 2022-12-30 MED ORDER — DICLOFENAC SODIUM 50 MG PO TBEC: 50.0000 mg | DELAYED_RELEASE_TABLET | Freq: Two times a day (BID) | ORAL | 3 refills | Status: DC

## 2022-12-30 MED ORDER — HYDROCODONE-ACETAMINOPHEN 10-325 MG PO TABS: 1.0000 | ORAL_TABLET | Freq: Four times a day (QID) | ORAL | 0 refills | Status: DC | PRN

## 2022-12-30 MED ORDER — ALPRAZOLAM 1 MG PO TABS: ORAL_TABLET | ORAL | 3 refills | Status: DC
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
## 2022-12-30 NOTE — Patient Instructions (Addendum)
ALWAYS FEEL FREE TO CALL OUR OFFICE WITH ANY PROBLEMS OR QUESTIONS [phone])  **PLEASE NOTE** ALL MEDICATION REFILL REQUESTS (INCLUDING CONTROLLED SUBSTANCES) NEED TO BE MADE AT LEAST 7 DAYS PRIOR TO REFILL BEING DUE. ANY REFILL REQUESTS INSIDE THAT TIME FRAME MAY RESULT IN DELAYS IN RECEIVING YOUR PRESCRIPTION.    TAKE DICLOFENAC TWICE DAILY FOR ONE MONTH THEN DECREASE TO DAILY WITH BREAKFAST.

## 2022-12-31 ENCOUNTER — Other Ambulatory Visit: Payer: Self-pay | Admitting: Registered Nurse

## 2023-01-02 LAB — DRUG TOX MONITOR 1 W/CONF, ORAL FLD
Alprazolam: 2.9 ng/mL — ABNORMAL HIGH (ref <0.50)
Amphetamines: NEGATIVE ng/mL (ref <10)
Barbiturates: NEGATIVE ng/mL (ref <10)
Benzodiazepines: POSITIVE ng/mL — AB (ref <0.50)
Buprenorphine: NEGATIVE ng/mL (ref <0.10)
Chlordiazepoxide: NEGATIVE ng/mL (ref <0.50)
Clonazepam: NEGATIVE ng/mL (ref <0.50)
Cocaine: NEGATIVE ng/mL (ref <5.0)
Codeine: NEGATIVE ng/mL (ref <2.5)
Diazepam: NEGATIVE ng/mL (ref <0.50)
Dihydrocodeine: 12.3 ng/mL — ABNORMAL HIGH (ref <2.5)
Fentanyl: NEGATIVE ng/mL (ref <0.10)
Flunitrazepam: NEGATIVE ng/mL (ref <0.50)
Flurazepam: NEGATIVE ng/mL (ref <0.50)
Heroin Metabolite: NEGATIVE ng/mL (ref <1.0)
Hydrocodone: 94 ng/mL — ABNORMAL HIGH (ref <2.5)
Hydromorphone: NEGATIVE ng/mL (ref <2.5)
Lorazepam: NEGATIVE ng/mL (ref <0.50)
MARIJUANA: NEGATIVE ng/mL (ref <2.5)
MDMA: NEGATIVE ng/mL (ref <10)
Meprobamate: NEGATIVE ng/mL (ref <2.5)
Methadone: NEGATIVE ng/mL (ref <5.0)
Midazolam: NEGATIVE ng/mL (ref <0.50)
Morphine: NEGATIVE ng/mL (ref <2.5)
Nicotine Metabolite: NEGATIVE ng/mL (ref <5.0)
Nordiazepam: NEGATIVE ng/mL (ref <0.50)
Norhydrocodone: 5.6 ng/mL — ABNORMAL HIGH (ref <2.5)
Noroxycodone: NEGATIVE ng/mL (ref <2.5)
Opiates: POSITIVE ng/mL — AB (ref <2.5)
Oxazepam: NEGATIVE ng/mL (ref <0.50)
Oxycodone: NEGATIVE ng/mL (ref <2.5)
Oxymorphone: NEGATIVE ng/mL (ref <2.5)
Phencyclidine: NEGATIVE ng/mL (ref <10)
Tapentadol: NEGATIVE ng/mL (ref <5.0)
Temazepam: NEGATIVE ng/mL (ref <0.50)
Tramadol: NEGATIVE ng/mL (ref <5.0)
Triazolam: NEGATIVE ng/mL (ref <0.50)
Zolpidem: NEGATIVE ng/mL (ref <5.0)

## 2023-01-02 LAB — DRUG TOX ALC METAB W/CON, ORAL FLD: Alcohol Metabolite: NEGATIVE ng/mL (ref <25)

## 2023-01-02 LAB — DRUG TOX METHYLPHEN W/CONF,ORAL FLD
Methylphenidate: 27.3 ng/mL — ABNORMAL HIGH (ref <1.0)
Methylphenidate: POSITIVE ng/mL — AB (ref <1.0)

## 2023-01-07 ENCOUNTER — Ambulatory Visit (HOSPITAL_COMMUNITY): Payer: PPO | Attending: Internal Medicine

## 2023-01-07 DIAGNOSIS — R0600 Dyspnea, unspecified: Secondary | ICD-10-CM | POA: Diagnosis present

## 2023-01-07 LAB — ECHOCARDIOGRAM COMPLETE
Area-P 1/2: 3.81 cm2
P 1/2 time: 603 msec
S' Lateral: 2.2 cm

## 2023-01-12 ENCOUNTER — Telehealth: Payer: Self-pay | Admitting: *Deleted

## 2023-01-12 NOTE — Telephone Encounter (Signed)
Oral swab drug screen was consistent for prescribed medications.  ?

## 2023-01-27 ENCOUNTER — Telehealth: Payer: Self-pay | Admitting: Physical Medicine & Rehabilitation

## 2023-01-27 DIAGNOSIS — F0781 Postconcussional syndrome: Secondary | ICD-10-CM

## 2023-01-27 MED ORDER — METHYLPHENIDATE HCL 10 MG PO TABS: 10.0000 mg | ORAL_TABLET | Freq: Two times a day (BID) | ORAL | 0 refills | Status: DC

## 2023-01-27 MED ORDER — HYDROCODONE-ACETAMINOPHEN 10-325 MG PO TABS: 1.0000 | ORAL_TABLET | Freq: Four times a day (QID) | ORAL | 0 refills | Status: DC | PRN

## 2023-01-27 NOTE — Telephone Encounter (Signed)
Requesting prescription refill for Ritalin, hydrocodone,

## 2023-01-27 NOTE — Telephone Encounter (Signed)
Rxs written and sent to the pharmacy. Thanks!  

## 2023-01-28 NOTE — Telephone Encounter (Signed)
LVM rx sent 

## 2023-02-09 ENCOUNTER — Ambulatory Visit
Admission: RE | Admit: 2023-02-09 | Discharge: 2023-02-09 | Disposition: A | Discharge status: Home / Self Care | Payer: PPO | Source: Ambulatory Visit | Attending: Family Medicine | Admitting: Family Medicine

## 2023-02-09 DIAGNOSIS — Z1231 Encounter for screening mammogram for malignant neoplasm of breast: Secondary | ICD-10-CM

## 2023-03-03 ENCOUNTER — Encounter: Payer: Self-pay | Admitting: Physical Medicine & Rehabilitation

## 2023-03-03 ENCOUNTER — Encounter: Payer: PPO | Attending: Physical Medicine and Rehabilitation | Admitting: Physical Medicine & Rehabilitation

## 2023-03-03 DIAGNOSIS — F0781 Postconcussional syndrome: Secondary | ICD-10-CM | POA: Diagnosis present

## 2023-03-03 MED ORDER — METHYLPHENIDATE HCL 10 MG PO TABS: 10.0000 mg | ORAL_TABLET | Freq: Two times a day (BID) | ORAL | 0 refills | Status: DC

## 2023-03-03 MED ORDER — HYDROCODONE-ACETAMINOPHEN 10-325 MG PO TABS: 1.0000 | ORAL_TABLET | Freq: Four times a day (QID) | ORAL | 0 refills | Status: DC | PRN

## 2023-03-03 MED ORDER — ALPRAZOLAM 0.5 MG PO TABS: ORAL_TABLET | ORAL | 3 refills | Status: DC

## 2023-03-03 NOTE — Patient Instructions (Signed)
ALWAYS FEEL FREE TO CALL OUR OFFICE WITH ANY PROBLEMS OR QUESTIONS [phone])  **PLEASE NOTE** ALL MEDICATION REFILL REQUESTS (INCLUDING CONTROLLED SUBSTANCES) NEED TO BE MADE AT LEAST 7 DAYS PRIOR TO REFILL BEING DUE. ANY REFILL REQUESTS INSIDE THAT TIME FRAME MAY RESULT IN DELAYS IN RECEIVING YOUR PRESCRIPTION.                    CHANGING XANAX TO 0.5MG  TABS  STOP ABILIFY  CHECK WITH PRIMARY ABOUT YOUR WEIGHT LOSS MEDS  CONTINUE EXERCISES FOR KNEES AND STAY ACTIVE

## 2023-03-03 NOTE — Progress Notes (Signed)
Subjective:    Patient ID: [name], female    DOB: 26-May-1956, 67 y.o.   MRN: [id number]  HPI  Robie is here in follow up of her chronic pain. She has good results with diclofenac for her knees. She is doing her knee strengthening exercises. She is able to bend her knees better, get down on the floor, etc. She's tolerating diclofenac without any problems. She uses hydrocodone for her more severe pain which works for her.   She is still dealing with anxiety and depression although it's been a little better after she's had time to adjust with the loss of mother and her dog. She says [place] She remains on effexor and ability. She uses xanax 3 x daily and finds this the most helpful.   Her primary put her on ozempic for weight loss. It is expensive for her to fill ($200 per fill). Martika is concerned about her weight.    Pain Inventory Average Pain 8 Pain Right Now 8 My pain is sharp, dull, stabbing, and aching  In the last 24 hours, has pain interfered with the following? General activity 10 Relation with others 10 Enjoyment of life 10 What TIME of day is your pain at its worst? varies Sleep (in general) Poor  Pain is worse with: walking, bending, sitting, inactivity, standing, and some activites Pain improves with: rest, heat/ice, pacing activities, medication, and injections Relief from Meds: 7  Family History  Problem Relation Age of Onset   Cervical cancer Mother    Diabetes Mother    Stroke Mother    Alzheimer's disease Mother    Heart attack Father    Stroke Father    Colon cancer Father    Anxiety disorder Maternal Aunt    Depression Maternal Aunt    Anxiety disorder Maternal Uncle    Depression Maternal Uncle        suicide attempt by gun shot wound to head. Survived   Alcohol abuse Other    Drug abuse Other    Breast cancer Maternal Grandmother    Diabetes Maternal Grandmother    Breast cancer  Paternal Aunt    Colon polyps Sister        x 2   Social History   Socioeconomic History   Marital status: Married    Spouse name: Not on file   Number of children: 0   Years of education: Not on file   Highest education level: Not on file  Occupational History   Occupation: disabled  Tobacco Use   Smoking status: Never   Smokeless tobacco: Never  Vaping Use   Vaping Use: Never used  Substance and Sexual Activity   Alcohol use: No    Alcohol/week: 0.0 standard drinks of alcohol   Drug use: No   Sexual activity: Yes  Other Topics Concern   Not on file  Social History Narrative   Pt has h.s. degree   Social Determinants of Health   Financial Resource Strain: Not on file  Food Insecurity: Not on file  Transportation Needs: Not on file  Physical Activity: Not on file  Stress: Not on file  Social Connections: Not on file   Past Surgical History:  Procedure Laterality Date   ABDOMINAL HYSTERECTOMY  1999   bunions removed  1988   C4 C5 cage insertion  06/28/2013   CHOLECYSTECTOMY  04/25/2008   EDG  12/17/2004  ELECTROCARDIOGRAM  05/27/2007   L5 S1 rod insertion  11/07/2012   SKIN CANCER EXCISION     SKIN CANCER EXCISION     several   TONSILLECTOMY  1981   TUBAL LIGATION  1997   Past Surgical History:  Procedure Laterality Date   ABDOMINAL HYSTERECTOMY  1999   bunions removed  1988   C4 C5 cage insertion  06/28/2013   CHOLECYSTECTOMY  04/25/2008   EDG  12/17/2004   ELECTROCARDIOGRAM  05/27/2007   L5 S1 rod insertion  11/07/2012   SKIN CANCER EXCISION     SKIN CANCER EXCISION     several   TONSILLECTOMY  1981   TUBAL LIGATION  1997   Past Medical History:  Diagnosis Date   Anemia    ANXIETY 06/10/2007   Cataract    Chronic headaches    Degenerative arthritis of spine    back and neck   Depression    DYSLIPIDEMIA 06/28/2008   Esophageal stricture    Fibromyalgia    Gallstones    GERD 06/10/2007   Hiatal hernia    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   HYPERTENSION 06/10/2007    Irritable bowel syndrome 06/28/2008   OSTEOARTHRITIS 06/10/2007   Skin cancer    basal cell   Tubular adenoma of colon    BP 132/77   Pulse 60   Ht 5\' 1"  (1.549 m)   Wt 175 lb (79.4 kg)   SpO2 98%   BMI 33.07 kg/m   Opioid Risk Score:   Fall Risk Score:  `1  Depression screen PHQ 2/9     12/30/2022    2:57 PM 10/26/2022    1:54 PM 08/26/2022    2:12 PM 05/28/2022    2:14 PM 04/22/2022    2:12 PM 03/25/2022    1:41 PM 02/26/2022    2:03 PM  Depression screen PHQ 2/9  Decreased Interest 1 0 1 0 0 1 3  Down, Depressed, Hopeless 1 0 1 0 0 1 3  PHQ - 2 Score 2 0 2 0 0 2 6     Review of Systems  Musculoskeletal:  Positive for back pain.       B/L knee  All other systems reviewed and are negative.      Objective:   Physical Exam  Constitutional: No distress . Vital signs reviewed. HEENT: NCAT, EOMI, oral membranes moist Neck: supple Cardiovascular: RRR without murmur. No JVD    Respiratory/Chest: CTA Bilaterally without wheezes or rales. Normal effort    GI/Abdomen: BS +, non-tender, non-distended Ext: no clubbing, cyanosis, or edema Psych:pleasant as always.  Musculoskeletal: LB tender. Mild crepitus of knees. Bearing weight a lot better on both knees. Better weight shift, left antalgia noted.  Neurological: memory and cognition are at baseline. Strength symmetrical. Sensory is decreased in feet?l. DTR's 1+.  Skin: Skin is warm.          Assessment & Plan:   ASSESSMENT:  1. 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 s/p L4-5 L5-S1 decompression stabilization with substantial mprovement of her back and right leg pain.  2. Bilateral knee pain, OA  3. Depression with anxiety.  High anxiety after loss of dog, and with declining health of mother 4. Cervical spondylosis/Myofascial pain.  5. Concussion on 05/13/14 with post concussion syndrome-   6. 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 Maintain ritalin 10mg  for  attention/arousal---refilled #60--.    2. refilled xanax at 0.5mg  tid prn.  Dc abilify due to weight gain. Hasn't been able to tolerate higher doses of effexor in past. May need another agent.  3. Hydrocodone will continue for breakthrough pain 10/325 q6 prn #120. Was just refilled last week. We will continue the controlled substance monitoring program, this consists of regular clinic visits, examinations, routine drug screening, pill counts as well as use of [place] Controlled Substance Reporting System. [place] today.  .   4. Continue with counseling with Dr. Kieth Brightly which has been extremely helpful.        -          -xanax prn--change to 1mg  tabs. -social interaction  5. Knee xrays were reviewed. -continue diclofenac 50mg  qd which is helping her.  -knee strengthening exercises should continue -weight loss  6 .Follow up in about 2 months . 20 minutes of face to face patient care time were spent during this visit. All questions were encouraged and answered.

## 2023-03-31 ENCOUNTER — Telehealth: Payer: Self-pay | Admitting: *Deleted

## 2023-03-31 DIAGNOSIS — F0781 Postconcussional syndrome: Secondary | ICD-10-CM

## 2023-03-31 MED ORDER — HYDROCODONE-ACETAMINOPHEN 10-325 MG PO TABS: 1.0000 | ORAL_TABLET | Freq: Four times a day (QID) | ORAL | 0 refills | Status: DC | PRN

## 2023-03-31 MED ORDER — METHYLPHENIDATE HCL 10 MG PO TABS: 10.0000 mg | ORAL_TABLET | Freq: Two times a day (BID) | ORAL | 0 refills | Status: DC

## 2023-03-31 NOTE — Telephone Encounter (Signed)
Patient requesting refill on Ritalin 10 mg and Hydrocodone 10-325 @ [facility].

## 2023-03-31 NOTE — Telephone Encounter (Signed)
Rxs written and sent to the pharmacy. Thanks!  

## 2023-04-01 NOTE — Telephone Encounter (Signed)
LVM rx sent 

## 2023-04-23 ENCOUNTER — Other Ambulatory Visit: Payer: Self-pay | Admitting: Physical Medicine & Rehabilitation

## 2023-04-28 ENCOUNTER — Encounter: Payer: PPO | Attending: Physical Medicine and Rehabilitation | Admitting: Physical Medicine & Rehabilitation

## 2023-04-28 ENCOUNTER — Encounter: Payer: Self-pay | Admitting: Physical Medicine & Rehabilitation

## 2023-04-28 VITALS — BP 127/75 | HR 61 | Ht 61.0 in | Wt 175.2 lb

## 2023-04-28 DIAGNOSIS — F329 Major depressive disorder, single episode, unspecified: Secondary | ICD-10-CM | POA: Diagnosis present

## 2023-04-28 DIAGNOSIS — G894 Chronic pain syndrome: Secondary | ICD-10-CM | POA: Insufficient documentation

## 2023-04-28 DIAGNOSIS — F0781 Postconcussional syndrome: Secondary | ICD-10-CM | POA: Diagnosis present

## 2023-04-28 MED ORDER — METHYLPHENIDATE HCL 10 MG PO TABS: 10.0000 mg | ORAL_TABLET | Freq: Two times a day (BID) | ORAL | 0 refills | Status: DC

## 2023-04-28 MED ORDER — HYDROCODONE-ACETAMINOPHEN 10-325 MG PO TABS: 1.0000 | ORAL_TABLET | Freq: Four times a day (QID) | ORAL | 0 refills | Status: DC | PRN

## 2023-04-28 MED ORDER — BUPROPION HCL ER (SR) 100 MG PO TB12: 100.0000 mg | ORAL_TABLET | Freq: Two times a day (BID) | ORAL | 2 refills | Status: DC

## 2023-04-28 NOTE — Progress Notes (Signed)
Subjective:    Patient ID: [name], female    DOB: 1956-04-13, 67 y.o.   MRN: [id number]  HPI  Lorina is here in follow up of her chronic pain.  From a pain standpoint she has been doing better.  The diclofenac has helped with her knee pain.  She is is struggling still with her weight and is frustrated that she cannot get any of it off.  Her other issue currently is that she feels more depressed.  She is not even sure exactly why.  She remains on Effexor 150 mg XR daily which has been on her MAR for some time.  She also takes Ritalin 10 mg twice a day for concentration.  I asked her what she does for fine and she states [place] and for leisure time.  However when she is at home she tends to be rather isolated and staying in the house quite a bit.  She does not go out with friends or exercise very much.  For general pain she remains on hydrocodone 1 tablet every 6 hours as needed   Pain Inventory Average Pain 7 Pain Right Now 7 My pain is sharp, dull, stabbing, and aching  In the last 24 hours, has pain interfered with the following? General activity 9 Relation with others 9 Enjoyment of life 10 What TIME of day is your pain at its worst? morning , daytime, evening, and night Sleep (in general) Fair  Pain is worse with: walking, sitting, standing, and some activites Pain improves with: rest, heat/ice, pacing activities, and medication Relief from Meds: 6  Family History  Problem Relation Age of Onset   Cervical cancer Mother    Diabetes Mother    Stroke Mother    Alzheimer's disease Mother    Heart attack Father    Stroke Father    Colon cancer Father    Anxiety disorder Maternal Aunt    Depression Maternal Aunt    Anxiety disorder Maternal Uncle    Depression Maternal Uncle        suicide attempt by gun shot wound to head. Survived   Alcohol abuse Other    Drug abuse Other    Breast cancer Maternal Grandmother    Diabetes  Maternal Grandmother    Breast cancer Paternal Aunt    Colon polyps Sister        x 2   Social History   Socioeconomic History   Marital status: Married    Spouse name: Not on file   Number of children: 0   Years of education: Not on file   Highest education level: Not on file  Occupational History   Occupation: disabled  Tobacco Use   Smoking status: Never   Smokeless tobacco: Never  Vaping Use   Vaping status: Never Used  Substance and Sexual Activity   Alcohol use: No    Alcohol/week: 0.0 standard drinks of alcohol   Drug use: No   Sexual activity: Yes  Other Topics Concern   Not on file  Social History Narrative   Pt has h.s. degree   Social Determinants of Health   Financial Resource Strain: Low Risk  (02/15/2023)   Received from [facility], [facility]   Overall Financial Resource Strain (CARDIA)    Difficulty of Paying Living Expenses: Not hard at all  Food Insecurity: No Food Insecurity (02/15/2023)   Received from [facility], [facility]   Hunger Vital  Sign    Worried About Programme researcher, broadcasting/film/video in the Last Year: Never true    Ran Out of Food in the Last Year: Never true  Transportation Needs: No Transportation Needs (02/15/2023)   Received from [employer], [facility]   PRAPARE - Transportation    Lack of Transportation (Medical): No    Lack of Transportation (Non-Medical): No  Physical Activity: Insufficiently Active (02/15/2023)   Received from [facility], [facility]   Exercise Vital Sign    Days of Exercise per Week: 2 days    Minutes of Exercise per Session: 30 min  Stress: Patient Declined (02/15/2023)   Received from [facility], [facility] of Occup[facility] - Occupational Stress Questionnaire    Feeling of Stress : Patient declined  Social Connections: Socially Integrated (02/15/2023)   Received from [facility], [facility]   Social Network    How would you rate your social network (family, work,  friends)?: Good participation with social networks   Past Surgical History:  Procedure Laterality Date   ABDOMINAL HYSTERECTOMY  1999   bunions removed  1988   C4 C5 cage insertion  06/28/2013   CHOLECYSTECTOMY  04/25/2008   EDG  12/17/2004   ELECTROCARDIOGRAM  05/27/2007   L5 S1 rod insertion  11/07/2012   SKIN CANCER EXCISION     SKIN CANCER EXCISION     several   TONSILLECTOMY  1981   TUBAL LIGATION  1997   Past Surgical History:  Procedure Laterality Date   ABDOMINAL HYSTERECTOMY  1999   bunions removed  1988   C4 C5 cage insertion  06/28/2013   CHOLECYSTECTOMY  04/25/2008   EDG  12/17/2004   ELECTROCARDIOGRAM  05/27/2007   L5 S1 rod insertion  11/07/2012   SKIN CANCER EXCISION     SKIN CANCER EXCISION     several   TONSILLECTOMY  1981   TUBAL LIGATION  1997   Past Medical History:  Diagnosis Date   Anemia    ANXIETY 06/10/2007   Cataract    Chronic headaches    Degenerative arthritis of spine    back and neck   Depression    DYSLIPIDEMIA 06/28/2008   Esophageal stricture    Fibromyalgia    Gallstones    GERD 06/10/2007   Hiatal hernia    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   HYPERTENSION 06/10/2007   Irritable bowel syndrome 06/28/2008   OSTEOARTHRITIS 06/10/2007   Skin cancer    basal cell   Tubular adenoma of colon    BP 127/75   Pulse 61   Ht 5\' 1"  (1.549 m)   Wt 175 lb 3.2 oz (79.5 kg)   SpO2 96%   BMI 33.10 kg/m   Opioid Risk Score:   Fall Risk Score:  `1  Depression screen PHQ 2/9     04/28/2023    2:23 PM 12/30/2022    2:57 PM 10/26/2022    1:54 PM 08/26/2022    2:12 PM 05/28/2022    2:14 PM 04/22/2022    2:12 PM 03/25/2022    1:41 PM  Depression screen PHQ 2/9  Decreased Interest 1 1 0 1 0 0 1  Down, Depressed, Hopeless 1 1 0 1 0 0 1  PHQ - 2 Score 2 2 0 2 0 0 2     Review of Systems  Constitutional: Negative.   HENT: Negative.    Eyes: Negative.   Respiratory: Negative.    Cardiovascular: Negative.   Gastrointestinal:  Negative.   Endocrine: Negative.    Genitourinary: Negative.   Musculoskeletal:  Positive for arthralgias, back pain and myalgias.       Right knee  Skin: Negative.   Allergic/Immunologic: Negative.   Neurological: Negative.   Hematological: Negative.   Psychiatric/Behavioral: Negative.    All other systems reviewed and are negative.      Objective:   Physical Exam General: No acute distress HEENT: NCAT, EOMI, oral membranes moist Cards: reg rate  Chest: normal effort Abdomen: Soft, NT, ND Skin: dry, intact Extremities: no edema Psych: pleasant and appropriate. A little flat   Musculoskeletal: LB tender. Mild crepitus of knees. Bearing weight a lot better on both knees. Better weight shift, left antalgia noted. Fair osture.  Neurological: memory and cognition are at baseline. Strength symmetrical. Sensory is decreased in feet?l. DTR's 1+. Sttrength 5/5.            Assessment & Plan:   ASSESSMENT:  1. 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 s/p L4-5 L5-S1 decompression stabilization with substantial mprovement of her back and right leg pain.  2. Bilateral knee pain, OA  3. Depression with anxiety.  High anxiety after loss of dog, and with declining health of mother 4. Cervical spondylosis/Myofascial pain.  5. Concussion on 05/13/14 with post concussion syndrome-   6. 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 Maintain ritalin 10mg  for attention/arousal---refilled #60--.    2. refilled xanax at 0.5mg  tid prn.   -wean off effexor and begin trial of wellbutrin.  Transitional schedule was provided 3. Hydrocodone will continue for breakthrough pain 10/325 q6 prn #120.  Marland Kitchen We will continue the controlled substance monitoring program, this consists of regular clinic visits, examinations, routine drug screening, pill counts as well as use of [place] Controlled Substance Reporting System. [place] was [place] today.    4. Continue with counseling with Dr. Kieth Brightly which has been extremely helpful.        -          -xanax prn--no refills today needed. -social interaction with friends and perhaps through exercise. - 5. Knee pain. Much improved. -continue diclofenac 50mg  qd with food -knee strengthening exercises should continue -weight loss   6 .Follow up in about 2 months . 20 minutes of face to face patient care time were spent during this visit. All questions were encouraged and answered.

## 2023-04-28 NOTE — Patient Instructions (Addendum)
ALWAYS FEEL FREE TO CALL OUR OFFICE WITH ANY PROBLEMS OR QUESTIONS [phone])  **PLEASE NOTE** ALL MEDICATION REFILL REQUESTS (INCLUDING CONTROLLED SUBSTANCES) NEED TO BE MADE AT LEAST 7 DAYS PRIOR TO REFILL BEING DUE. ANY REFILL REQUESTS INSIDE THAT TIME FRAME MAY RESULT IN DELAYS IN RECEIVING YOUR PRESCRIPTION.    EFFEXOR EVERY OTHER DAY FOR 6 DAYS THEN EVERY THIRD DAY FOR 9 DAYS THEN OFF.   WELLBUTRIN TAKE 1OOMG DAILY FOR 6 DAYS THEN TWICE DAILY THEREAFTER

## 2023-05-21 ENCOUNTER — Emergency Department (HOSPITAL_COMMUNITY)
Admission: EM | Admit: 2023-05-21 | Discharge: 2023-05-22 | Disposition: A | Discharge status: Home / Self Care | Payer: PPO | Attending: Emergency Medicine | Admitting: Emergency Medicine

## 2023-05-21 ENCOUNTER — Encounter (HOSPITAL_COMMUNITY): Payer: Self-pay | Admitting: *Deleted

## 2023-05-21 ENCOUNTER — Other Ambulatory Visit: Payer: Self-pay

## 2023-05-21 ENCOUNTER — Telehealth: Payer: Self-pay | Admitting: *Deleted

## 2023-05-21 ENCOUNTER — Other Ambulatory Visit: Payer: Self-pay | Admitting: Physical Medicine & Rehabilitation

## 2023-05-21 ENCOUNTER — Telehealth: Payer: Self-pay | Admitting: Registered Nurse

## 2023-05-21 DIAGNOSIS — R45851 Suicidal ideations: Secondary | ICD-10-CM | POA: Diagnosis not present

## 2023-05-21 DIAGNOSIS — I10 Essential (primary) hypertension: Secondary | ICD-10-CM | POA: Insufficient documentation

## 2023-05-21 DIAGNOSIS — Z85828 Personal history of other malignant neoplasm of skin: Secondary | ICD-10-CM | POA: Diagnosis not present

## 2023-05-21 DIAGNOSIS — F32A Depression, unspecified: Secondary | ICD-10-CM

## 2023-05-21 DIAGNOSIS — F329 Major depressive disorder, single episode, unspecified: Secondary | ICD-10-CM

## 2023-05-21 DIAGNOSIS — F332 Major depressive disorder, recurrent severe without psychotic features: Secondary | ICD-10-CM | POA: Insufficient documentation

## 2023-05-21 LAB — COMPREHENSIVE METABOLIC PANEL
ALT: 25 U/L (ref 0–44)
AST: 21 U/L (ref 15–41)
Albumin: 4 g/dL (ref 3.5–5.0)
Alkaline Phosphatase: 72 U/L (ref 38–126)
Anion gap: 10 (ref 5–15)
BUN: 15 mg/dL (ref 8–23)
CO2: 26 mmol/L (ref 22–32)
Calcium: 9.5 mg/dL (ref 8.9–10.3)
Chloride: 104 mmol/L (ref 98–111)
Creatinine, Ser: 0.84 mg/dL (ref 0.44–1.00)
GFR, Estimated: >60 mL/min (ref >60)
Glucose, Bld: 158 mg/dL — ABNORMAL HIGH (ref 70–99)
Potassium: 3.8 mmol/L (ref 3.5–5.1)
Sodium: 140 mmol/L (ref 135–145)
Total Bilirubin: 0.6 mg/dL (ref 0.3–1.2)
Total Protein: 6.3 g/dL — ABNORMAL LOW (ref 6.5–8.1)

## 2023-05-21 LAB — CBC
HCT: 41.5 % (ref 36.0–46.0)
Hemoglobin: 13.7 g/dL (ref 12.0–15.0)
MCH: 28.1 pg (ref 26.0–34.0)
MCHC: 33 g/dL (ref 30.0–36.0)
MCV: 85 fL (ref 80.0–100.0)
Platelets: 199 10*3/uL (ref 150–400)
RBC: 4.88 MIL/uL (ref 3.87–5.11)
RDW: 13.2 % (ref 11.5–15.5)
WBC: 5.2 10*3/uL (ref 4.0–10.5)
nRBC: 0 % (ref 0.0–0.2)

## 2023-05-21 LAB — URINALYSIS, ROUTINE W REFLEX MICROSCOPIC
Bilirubin Urine: NEGATIVE
Glucose, UA: NEGATIVE mg/dL
Ketones, ur: NEGATIVE mg/dL
Nitrite: NEGATIVE
Protein, ur: NEGATIVE mg/dL
Specific Gravity, Urine: 1.008 (ref 1.005–1.030)
pH: 6 (ref 5.0–8.0)

## 2023-05-21 LAB — LIPASE, BLOOD: Lipase: 32 U/L (ref 11–51)

## 2023-05-21 MED ORDER — IRBESARTAN 75 MG PO TABS
75.0000 mg | ORAL_TABLET | Freq: Every day | ORAL | Status: DC
  Administered 2023-05-22: 75 mg via ORAL
  Filled 2023-05-21: qty 1

## 2023-05-21 MED ORDER — PROPRANOLOL HCL 10 MG PO TABS
40.0000 mg | ORAL_TABLET | Freq: Two times a day (BID) | ORAL | Status: DC
  Administered 2023-05-22 (×2): 40 mg via ORAL
  Filled 2023-05-21 (×2): qty 4

## 2023-05-21 MED ORDER — LORAZEPAM 1 MG PO TABS
1.0000 mg | ORAL_TABLET | ORAL | Status: DC | PRN
  Administered 2023-05-22: 1 mg via ORAL
  Filled 2023-05-21: qty 1

## 2023-05-21 MED ORDER — HYDROCODONE-ACETAMINOPHEN 10-325 MG PO TABS
1.0000 | ORAL_TABLET | Freq: Four times a day (QID) | ORAL | Status: DC | PRN
  Administered 2023-05-22: 1 via ORAL
  Filled 2023-05-21: qty 1

## 2023-05-21 NOTE — ED Notes (Signed)
The pt has been anxious since her med was switched  from effexor to wellbutryn she just feels sad

## 2023-05-21 NOTE — BH Assessment (Signed)
@  2157, requested patient's nurse Joya, RN and Mitzi Davenport, RN to place the telepsych machine in patient's room for the initial TTS assessment.

## 2023-05-21 NOTE — Telephone Encounter (Signed)
Return Ms. [place] call,  She states when she seen Dr [name] on  04/28/2023,he made changes to her Effexor and started her on Wellbutrin, his note was reviewed. She was tolerating the medication change until yesterday when the new was reporting about the [facility] and she has been watching news dealing with Politics and has become overwhelmed with sadness.  She denies suicidal plan, her husband is in the home with her, she was instructed to go to the emergency room to be evaluated. She will ask her husband to take her to the emergency room, she will update this provider in 30 minutes.

## 2023-05-21 NOTE — Telephone Encounter (Signed)
Call was placed to Ms. [place]marais, her husband is taking her to [facility] ED right now to be evaluated.

## 2023-05-21 NOTE — Telephone Encounter (Signed)
[name] called and is tearful and saying that she is very very depressed and that she is to the point she just doesn't want to be her anymore. She would like to talk with Dr [name] but both he and Dr Kieth Brightly are not in the office today or the hospital. I will sent this to [facility] to give her a call.

## 2023-05-21 NOTE — ED Provider Notes (Signed)
[place] EMERGENCY DEPARTMENT AT [facility] Provider Note   CSN: [id number] Arrival date & time: 05/21/23  1546     History  Chief Complaint  Patient presents with   Anxiety    [name] is a 67 y.o. female history of anxiety and depression here presenting with anxiety.  Patient states that she has been very anxious.  She has been on Effexor for years and has been progressively more depressed.  She saw her doctor about 3 weeks ago and they titrated off Effexor and switch her to Wellbutrin.  She states that for the last 1 to 2 weeks, she has been progressively more depressed.  Patient states that she does not feel that she wants to live anymore.  On the other hand she does not have any particular plan.  She states that she feels that it does not matter if she dies.  Denies any hallucination.   The history is provided by the patient.       Home Medications Prior to Admission medications   Medication Sig Start Date End Date Taking? Authorizing Provider  acyclovir (ZOVIRAX) 800 MG tablet Take by mouth. 01/28/22   [provider]  ALPRAZolam Prudy Feeler) 0.5 MG tablet TAKE 1/2 TAB BY MOUTH 3 TIMES A DAY AS NEEDED FOR ANXIETY 03/03/23   [name], MD  atorvastatin (LIPITOR) 80 MG tablet Take 1 tablet by mouth daily. 07/09/22   [provider]  buPROPion ER (WELLBUTRIN SR) 100 MG 12 hr tablet Take 1 tablet (100 mg total) by mouth 2 (two) times daily. DX:F32.9 05/21/23   [name], MD  diclofenac (VOLTAREN) 50 MG EC tablet TAKE 1 TABLET (50 MG TOTAL) BY MOUTH 2 (TWO) TIMES DAILY WITH A MEAL. 04/23/23   [name], MD  famotidine (PEPCID) 20 MG tablet Take 20 mg by mouth at bedtime. 12/23/20   [provider]  HYDROcodone-acetaminophen (NORCO) 10-325 MG tablet Take 1 tablet by mouth every 6 (six) hours as needed. 04/28/23   [name], MD  irbesartan (AVAPRO) 75 MG tablet Take 1 tablet (75 mg total) by mouth daily. 10/24/19   Willow Ora, MD  MAXITROL 3.5-10000-0.1 OINT SMARTSIG:1 sparingly In Eye(s) Twice Daily 08/21/22   [provider]  methylphenidate (RITALIN) 10 MG tablet Take 1 tablet (10 mg total) by mouth 2 (two) times daily with breakfast and lunch. 2536,6440. 04/28/23   [name], MD  metroNIDAZOLE (METROCREAM) 0.75 % cream Apply topically. 12/11/22   [provider]  omeprazole (PRILOSEC) 40 MG capsule Take by mouth.    [provider]  propranolol (INDERAL) 40 MG tablet Take 1 tablet by mouth 2 (two) times daily. 07/09/22   [provider]  RESTASIS 0.05 % ophthalmic emulsion Place 1 drop into both eyes 2 (two) times daily. 08/31/20   [provider]      Allergies    Gabapentin, Poison ivy extract [poison ivy extract], Poison oak extract [poison oak extract], and Poison sumac extract    Review of Systems   Review of Systems  Psychiatric/Behavioral:  Positive for dysphoric mood and suicidal ideas.   All other systems reviewed and are negative.   Physical Exam Updated Vital Signs BP (!) 159/99   Pulse 78   Temp 98.2 F (36.8 C) (Oral)   Resp 18   Ht 5\' 1"  (1.549 m)   Wt 79.5 kg   SpO2 100%   BMI 33.12 kg/m  Physical Exam Vitals  and nursing note reviewed.  Constitutional:      Comments: Depressed and crying  HENT:     Head: Normocephalic.     Nose: Nose normal.     Mouth/Throat:     Mouth: Mucous membranes are moist.  Eyes:     Extraocular Movements: Extraocular movements intact.     Pupils: Pupils are equal, round, and reactive to light.  Cardiovascular:     Rate and Rhythm: Normal rate and regular rhythm.     Pulses: Normal pulses.     Heart sounds: Normal heart sounds.  Pulmonary:     Effort: Pulmonary effort is normal.     Breath sounds: Normal breath sounds.  Abdominal:     General: Abdomen is flat.     Palpations: Abdomen is soft.  Musculoskeletal:        General: Normal range of motion.     Cervical back: Normal range of  motion and neck supple.  Skin:    General: Skin is warm.     Capillary Refill: Capillary refill takes less than 2 seconds.  Neurological:     General: No focal deficit present.     Mental Status: She is alert.  Psychiatric:     Comments: Depressed and anxious      ED Results / Procedures / Treatments   Labs (all labs ordered are listed, but only abnormal results are displayed) Labs Reviewed  COMPREHENSIVE METABOLIC PANEL - Abnormal; Notable for the following components:      Result Value   Glucose, Bld 158 (*)    Total Protein 6.3 (*)    All other components within normal limits  URINALYSIS, ROUTINE W REFLEX MICROSCOPIC - Abnormal; Notable for the following components:   Color, Urine STRAW (*)    Hgb urine dipstick SMALL (*)    Leukocytes,Ua MODERATE (*)    Bacteria, UA RARE (*)    All other components within normal limits  LIPASE, BLOOD  CBC  RAPID URINE DRUG SCREEN, HOSP PERFORMED    EKG None  Radiology No results found.  Procedures Procedures    Medications Ordered in ED Medications  LORazepam (ATIVAN) tablet 1 mg (has no administration in time range)    ED Course/ Medical Decision Making/ A&P                                 Medical Decision Making [name] is a 67 y.o. female here with anxiety and suicidal ideation. Will get psych clearance labs and consult TTS.   10:00 PM Labs unremarkable and urinalysis unremarkable.  Patient is medically clear for psych eval   Problems Addressed: Depression, unspecified depression type: chronic illness or injury  Amount and/or Complexity of Data Reviewed Labs: ordered. Decision-making details documented in ED Course.  Risk Prescription drug management.    Final Clinical Impression(s) / ED Diagnoses Final diagnoses:  None    Rx / DC Orders ED Discharge Orders     None         Charlynne Pander, MD 05/21/23 2327

## 2023-05-21 NOTE — ED Triage Notes (Signed)
The pt was placed on wellbutryn 1-2 weeks ago and that has been going on since she started this med  she was told to come here by her doctor who started her on this med  she feels unhappy all the time she had been on effexor

## 2023-05-21 NOTE — ED Notes (Addendum)
Pt appears to be very emotional and anxious.

## 2023-05-22 ENCOUNTER — Encounter (HOSPITAL_COMMUNITY): Payer: Self-pay | Admitting: Psychiatry

## 2023-05-22 DIAGNOSIS — F332 Major depressive disorder, recurrent severe without psychotic features: Secondary | ICD-10-CM

## 2023-05-22 MED ORDER — VENLAFAXINE HCL ER 75 MG PO CP24: 75.0000 mg | ORAL_CAPSULE | Freq: Every day | ORAL | Status: DC

## 2023-05-22 MED ORDER — VENLAFAXINE HCL ER 75 MG PO CP24: 75.0000 mg | ORAL_CAPSULE | Freq: Every day | ORAL | 0 refills | Status: DC
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
## 2023-05-22 NOTE — Discharge Instructions (Signed)
Please follow-up with the [facility] outpatient psychiatric office for medication management and care.

## 2023-05-22 NOTE — ED Notes (Signed)
Pt ambulated from ed with steady gait. Pt husband to drive home

## 2023-05-24 ENCOUNTER — Telehealth: Payer: Self-pay | Admitting: *Deleted

## 2023-05-24 ENCOUNTER — Telehealth: Payer: Self-pay | Admitting: Registered Nurse

## 2023-05-24 NOTE — Telephone Encounter (Signed)
Return [name],  She was evaluated oin the emergency room on 05/21/2023, note was reviewed.  She has a scheduled appointment with Dr [name], will obtain an appointment with Dr Kieth Brightly. She verbalizes understanding.  [name] states she is feeling better and denies any suicidal ideation.

## 2023-05-24 NOTE — Telephone Encounter (Signed)
Patient calling with f/u information re: Friday Please call back @ [phone].

## 2023-05-24 NOTE — Telephone Encounter (Signed)
This provider spoke with Dr Kieth Brightly regarding [name] evaluation. He states " she should continue medication regimen as instructed by Evans Physician Dr Andria Meuse, [name] was called regarding the above and she verbalizes understanding. She has a scheduled appointment with Dr Kieth Brightly on Thursday 05/27/2023.

## 2023-05-27 ENCOUNTER — Encounter: Payer: PPO | Attending: Physical Medicine and Rehabilitation | Admitting: Psychology

## 2023-05-27 DIAGNOSIS — F4310 Post-traumatic stress disorder, unspecified: Secondary | ICD-10-CM | POA: Insufficient documentation

## 2023-05-27 DIAGNOSIS — F0781 Postconcussional syndrome: Secondary | ICD-10-CM | POA: Diagnosis present

## 2023-05-27 DIAGNOSIS — G894 Chronic pain syndrome: Secondary | ICD-10-CM | POA: Diagnosis present

## 2023-05-27 DIAGNOSIS — F332 Major depressive disorder, recurrent severe without psychotic features: Secondary | ICD-10-CM | POA: Insufficient documentation

## 2023-05-30 ENCOUNTER — Other Ambulatory Visit: Payer: Self-pay | Admitting: Registered Nurse

## 2023-05-30 DIAGNOSIS — F0781 Postconcussional syndrome: Secondary | ICD-10-CM

## 2023-05-30 DIAGNOSIS — G894 Chronic pain syndrome: Secondary | ICD-10-CM

## 2023-05-31 ENCOUNTER — Ambulatory Visit: Payer: PPO | Admitting: Physical Medicine & Rehabilitation

## 2023-05-31 ENCOUNTER — Encounter: Payer: Self-pay | Admitting: Psychology

## 2023-05-31 MED ORDER — HYDROCODONE-ACETAMINOPHEN 10-325 MG PO TABS: 1.0000 | ORAL_TABLET | Freq: Four times a day (QID) | ORAL | 0 refills | Status: DC | PRN

## 2023-05-31 MED ORDER — METHYLPHENIDATE HCL 10 MG PO TABS: 10.0000 mg | ORAL_TABLET | Freq: Two times a day (BID) | ORAL | 0 refills | Status: DC

## 2023-05-31 MED ORDER — VENLAFAXINE HCL ER 75 MG PO CP24: 75.0000 mg | ORAL_CAPSULE | Freq: Every day | ORAL | 1 refills | Status: DC

## 2023-05-31 NOTE — Telephone Encounter (Signed)
Patient stated only had #5 Effexor-XR on hand. She is also requesting Ritalin, Hydrocodone refills.   Please send to [facility] on [address].  Call back phone [phone].
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
## 2023-05-31 NOTE — Telephone Encounter (Signed)
PMP was Reviewed.  Spoke with Dr Hali Marry and reviewed his note. Ms. Mendell will continue with Effexor and Wellbutrin, she is doing well.  Call placed to Ms.Marcelle Overlie regarding the above, she verbalizes understanding.

## 2023-06-23 ENCOUNTER — Encounter: Payer: Self-pay | Admitting: Physical Medicine & Rehabilitation

## 2023-06-23 ENCOUNTER — Encounter: Payer: PPO | Attending: Physical Medicine and Rehabilitation | Admitting: Physical Medicine & Rehabilitation

## 2023-06-23 VITALS — BP 137/84 | HR 67 | Ht 61.0 in | Wt 178.2 lb

## 2023-06-23 DIAGNOSIS — F410 Panic disorder [episodic paroxysmal anxiety] without agoraphobia: Secondary | ICD-10-CM | POA: Diagnosis not present

## 2023-06-23 DIAGNOSIS — G894 Chronic pain syndrome: Secondary | ICD-10-CM

## 2023-06-23 DIAGNOSIS — F0781 Postconcussional syndrome: Secondary | ICD-10-CM | POA: Diagnosis not present

## 2023-06-23 MED ORDER — METHYLPHENIDATE HCL 10 MG PO TABS: 10.0000 mg | ORAL_TABLET | Freq: Two times a day (BID) | ORAL | 0 refills | Status: DC

## 2023-06-23 MED ORDER — BUSPIRONE HCL 5 MG PO TABS: 5.0000 mg | ORAL_TABLET | Freq: Two times a day (BID) | ORAL | 5 refills | Status: DC

## 2023-06-23 MED ORDER — HYDROCODONE-ACETAMINOPHEN 10-325 MG PO TABS: 1.0000 | ORAL_TABLET | Freq: Four times a day (QID) | ORAL | 0 refills | Status: DC | PRN
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
## 2023-06-23 NOTE — Patient Instructions (Signed)
ALWAYS FEEL FREE TO CALL OUR OFFICE WITH ANY PROBLEMS OR QUESTIONS ([phone])  **PLEASE NOTE** ALL MEDICATION REFILL REQUESTS (INCLUDING CONTROLLED SUBSTANCES) NEED TO BE MADE AT LEAST 7 DAYS PRIOR TO REFILL BEING DUE. ANY REFILL REQUESTS INSIDE THAT TIME FRAME MAY RESULT IN DELAYS IN RECEIVING YOUR PRESCRIPTION.                    

## 2023-06-26 ENCOUNTER — Other Ambulatory Visit: Payer: Self-pay | Admitting: Registered Nurse

## 2023-06-30 ENCOUNTER — Other Ambulatory Visit: Payer: Self-pay | Admitting: Registered Nurse

## 2023-07-17 ENCOUNTER — Other Ambulatory Visit: Payer: Self-pay | Admitting: Physical Medicine & Rehabilitation

## 2023-07-17 DIAGNOSIS — F410 Panic disorder [episodic paroxysmal anxiety] without agoraphobia: Secondary | ICD-10-CM

## 2023-07-26 ENCOUNTER — Telehealth: Payer: Self-pay | Admitting: Registered Nurse

## 2023-07-26 ENCOUNTER — Other Ambulatory Visit: Payer: Self-pay | Admitting: Registered Nurse

## 2023-07-26 ENCOUNTER — Telehealth: Payer: Self-pay

## 2023-07-26 DIAGNOSIS — F0781 Postconcussional syndrome: Secondary | ICD-10-CM

## 2023-07-26 DIAGNOSIS — G894 Chronic pain syndrome: Secondary | ICD-10-CM

## 2023-07-26 MED ORDER — METHYLPHENIDATE HCL 10 MG PO TABS: 10.0000 mg | ORAL_TABLET | Freq: Two times a day (BID) | ORAL | 0 refills | Status: DC

## 2023-07-26 MED ORDER — HYDROCODONE-ACETAMINOPHEN 10-325 MG PO TABS
1.0000 | ORAL_TABLET | Freq: Four times a day (QID) | ORAL | 0 refills | Status: DC | PRN
Start: 2023-07-26 — End: 2023-08-25

## 2023-07-26 NOTE — Telephone Encounter (Signed)
PMP was Reviewed. Hydrocodone and Methylphenidate ordered today.  [name] is aware of the above via My-Chart .

## 2023-07-28 NOTE — Telephone Encounter (Signed)
Per chart message Rx sent.

## 2023-08-17 ENCOUNTER — Other Ambulatory Visit: Payer: Self-pay | Admitting: Physical Medicine & Rehabilitation

## 2023-08-25 ENCOUNTER — Encounter: Payer: Self-pay | Admitting: Physical Medicine & Rehabilitation

## 2023-08-25 ENCOUNTER — Encounter: Payer: PPO | Attending: Physical Medicine and Rehabilitation | Admitting: Physical Medicine & Rehabilitation

## 2023-08-25 VITALS — BP 121/72 | HR 71 | Ht 61.0 in | Wt 178.0 lb

## 2023-08-25 DIAGNOSIS — F411 Generalized anxiety disorder: Secondary | ICD-10-CM | POA: Diagnosis present

## 2023-08-25 DIAGNOSIS — M1711 Unilateral primary osteoarthritis, right knee: Secondary | ICD-10-CM | POA: Diagnosis present

## 2023-08-25 DIAGNOSIS — M47817 Spondylosis without myelopathy or radiculopathy, lumbosacral region: Secondary | ICD-10-CM | POA: Diagnosis present

## 2023-08-25 DIAGNOSIS — G894 Chronic pain syndrome: Secondary | ICD-10-CM

## 2023-08-25 DIAGNOSIS — M47816 Spondylosis without myelopathy or radiculopathy, lumbar region: Secondary | ICD-10-CM

## 2023-08-25 DIAGNOSIS — Z5181 Encounter for therapeutic drug level monitoring: Secondary | ICD-10-CM

## 2023-08-25 DIAGNOSIS — Z79891 Long term (current) use of opiate analgesic: Secondary | ICD-10-CM | POA: Diagnosis present

## 2023-08-25 DIAGNOSIS — F0781 Postconcussional syndrome: Secondary | ICD-10-CM

## 2023-08-25 DIAGNOSIS — F410 Panic disorder [episodic paroxysmal anxiety] without agoraphobia: Secondary | ICD-10-CM | POA: Diagnosis not present

## 2023-08-25 MED ORDER — HYDROCODONE-ACETAMINOPHEN 10-325 MG PO TABS: 1.0000 | ORAL_TABLET | Freq: Four times a day (QID) | ORAL | 0 refills | Status: DC | PRN

## 2023-08-25 MED ORDER — ALPRAZOLAM 0.5 MG PO TABS: 0.5000 mg | ORAL_TABLET | Freq: Two times a day (BID) | ORAL | 2 refills | Status: DC

## 2023-08-25 MED ORDER — DICLOFENAC SODIUM 50 MG PO TBEC: 50.0000 mg | DELAYED_RELEASE_TABLET | Freq: Two times a day (BID) | ORAL | Status: DC | PRN

## 2023-08-25 MED ORDER — BUSPIRONE HCL 10 MG PO TABS: 5.0000 mg | ORAL_TABLET | Freq: Two times a day (BID) | ORAL | 3 refills | Status: DC

## 2023-08-25 MED ORDER — BUSPIRONE HCL 10 MG PO TABS: 10.0000 mg | ORAL_TABLET | Freq: Two times a day (BID) | ORAL | 5 refills | Status: DC

## 2023-08-25 MED ORDER — METHYLPHENIDATE HCL 10 MG PO TABS: 10.0000 mg | ORAL_TABLET | Freq: Two times a day (BID) | ORAL | 0 refills | Status: DC

## 2023-08-25 NOTE — Patient Instructions (Signed)
ALWAYS FEEL FREE TO CALL OUR OFFICE WITH ANY PROBLEMS OR QUESTIONS ([phone])  **PLEASE NOTE** ALL MEDICATION REFILL REQUESTS (INCLUDING CONTROLLED SUBSTANCES) NEED TO BE MADE AT LEAST 7 DAYS PRIOR TO REFILL BEING DUE. ANY REFILL REQUESTS INSIDE THAT TIME FRAME MAY RESULT IN DELAYS IN RECEIVING YOUR PRESCRIPTION.                    

## 2023-08-25 NOTE — Progress Notes (Signed)
Subjective:    Patient ID: [name], female    DOB: [birth year], 67 y.o.   MRN: [id number]  HPI  Saidey is here in follow up of her chronic pain and mood, behavior. She had been doing fairly well from a mood standpoint until recently. She tends to struggle when the calendar turns to November. She is feeling quite a bit of anxiety.  She also deals with a sister who increases her stress level and anxiety.  She has a hard time separating from her because she has a lot of good memories of what they used to be as a pair in the past.  The addition of BuSpar in general has helped her anxiety levels.  She continues on Effexor 75 mg daily and Wellbutrin 100 mg twice daily at present.  Her knee pain continues to do much better as well.  She is on diclofenac daily and asked if she can change to as needed.  She uses hydrocodone for breakthrough pain in her knee and generally more so for her low back at this point.  She tries to stay as active as she can at home.  She wears appropriate shoe wear.  Bowel and bladder function are regular.  She is sleeping fairly well and appetite is good.      Pain Inventory Average Pain 8 Pain Right Now 8 My pain is constant, sharp, burning, dull, stabbing, and aching  In the last 24 hours, has pain interfered with the following? General activity 10 Relation with others 10 Enjoyment of life 10 What TIME of day is your pain at its worst? daytime Sleep (in general) Fair  Pain is worse with: walking, sitting, standing, and some activites Pain improves with: rest, pacing activities, medication, and injections Relief from Meds: 5  Family History  Problem Relation Age of Onset   Cervical cancer Mother    Diabetes Mother    Stroke Mother    Alzheimer's disease Mother    Heart attack Father    Stroke Father    Colon cancer Father    Anxiety disorder Maternal Aunt    Depression Maternal Aunt    Anxiety disorder Maternal Uncle    Depression Maternal Uncle         suicide attempt by gun shot wound to head. Survived   Alcohol abuse Other    Drug abuse Other    Breast cancer Maternal Grandmother    Diabetes Maternal Grandmother    Breast cancer Paternal Aunt    Colon polyps Sister        x 2   Social History   Socioeconomic History   Marital status: Married    Spouse name: Not on file   Number of children: 0   Years of education: Not on file   Highest education level: Not on file  Occupational History   Occupation: disabled  Tobacco Use   Smoking status: Never   Smokeless tobacco: Never  Vaping Use   Vaping status: Never Used  Substance and Sexual Activity   Alcohol use: No    Alcohol/week: 0.0 standard drinks of alcohol   Drug use: No   Sexual activity: Yes  Other Topics Concern   Not on file  Social History Narrative   Pt has h.s. degree   Social Determinants of Health   Financial Resource Strain: Low Risk  (02/15/2023)   Received from [facility], [facility]   Overall Financial Resource Strain (CARDIA)    Difficulty of Paying Living  Expenses: Not hard at all  Food Insecurity: No Food Insecurity (02/15/2023)   Received from [facility], [facility]   Hunger Vital Sign    Worried About Running Out of Food in the Last Year: Never true    Ran Out of Food in the Last Year: Never true  Transportation Needs: No Transportation Needs (02/15/2023)   Received from [facility], [facility]   [facility] - Transportation    Lack of Transportation (Medical): No    Lack of Transportation (Non-Medical): No  Physical Activity: Insufficiently Active (02/15/2023)   Received from [facility], [facility]   Exercise Vital Sign    Days of Exercise per Week: 2 days    Minutes of Exercise per Session: 30 min  Stress: Patient Declined (02/15/2023)   Received from [facility], [facility] - Occupational Stress Questionnaire    Feeling of Stress : Patient declined  Social Connections:  Socially Integrated (02/15/2023)   Received from [facility], [facility]   Social Network    How would you rate your social network (family, work, friends)?: Good participation with social networks   Past Surgical History:  Procedure Laterality Date   ABDOMINAL HYSTERECTOMY  1999   bunions removed  1988   C4 C5 cage insertion  06/28/2013   CHOLECYSTECTOMY  04/25/2008   EDG  12/17/2004   ELECTROCARDIOGRAM  05/27/2007   L5 S1 rod insertion  11/07/2012   SKIN CANCER EXCISION     SKIN CANCER EXCISION     several   TONSILLECTOMY  1981   TUBAL LIGATION  1997   Past Surgical History:  Procedure Laterality Date   ABDOMINAL HYSTERECTOMY  1999   bunions removed  1988   C4 C5 cage insertion  06/28/2013   CHOLECYSTECTOMY  04/25/2008   EDG  12/17/2004   ELECTROCARDIOGRAM  05/27/2007   L5 S1 rod insertion  11/07/2012   SKIN CANCER EXCISION     SKIN CANCER EXCISION     several   TONSILLECTOMY  1981   TUBAL LIGATION  1997   Past Medical History:  Diagnosis Date   Anemia    ANXIETY 06/10/2007   Cataract    Chronic headaches    Degenerative arthritis of spine    back and neck   Depression    DYSLIPIDEMIA 06/28/2008   Esophageal stricture    Fibromyalgia    Gallstones    GERD 06/10/2007   Hiatal hernia    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   HYPERTENSION 06/10/2007   Irritable bowel syndrome 06/28/2008   OSTEOARTHRITIS 06/10/2007   Skin cancer    basal cell   Tubular adenoma of colon    BP 121/72   Pulse 71   Ht 5\' 1"  (1.549 m)   Wt 178 lb (80.7 kg)   SpO2 97%   BMI 33.63 kg/m   Opioid Risk Score:   Fall Risk Score:  `1  Depression screen PHQ 2/9     08/25/2023    3:05 PM 06/23/2023    2:23 PM 04/28/2023    2:23 PM 12/30/2022    2:57 PM 10/26/2022    1:54 PM 08/26/2022    2:12 PM 05/28/2022    2:14 PM  Depression screen PHQ 2/9  Decreased Interest 1 1 1 1  0 1 0  Down, Depressed, Hopeless 1 1 1 1  0 1 0  PHQ - 2 Score 2 2 2 2  0 2 0   8  Review of Systems  Musculoskeletal:  Positive  for back pain.       Pain in the left upper leg   All other systems reviewed and are negative.      Objective:   Physical Exam  General: No acute distress HEENT: NCAT, EOMI, oral membranes moist Cards: reg rate  Chest: normal effort Abdomen: Soft, NT, ND Skin: dry, intact Extremities: no edema Psych: pleasant and appropriate. Seems to be in good spirits   Musculoskeletal: LB tender. Mild crepitus of knees. Bearing weight a lot better on both knees. Improved weight shift  Neurological: memory and cognition are at baseline. Strength symmetrical. Sensory is decreased in feet?l. DTR's 1+. Sttrength 5/5.            Assessment & Plan:   ASSESSMENT:  1. 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 s/p L4-5 L5-S1 decompression stabilization with substantial mprovement of her back and right leg pain.  2. Bilateral knee pain, OA  3. Depression with anxiety.  Seasonal exacerbation 4. Cervical spondylosis/Myofascial pain.  5. Concussion on 05/13/14 with post concussion syndrome-   6. 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 Continue ritalin 10mg  for attention/arousal---refilled #60--.    2. refilled xanax at 0.5mg bid #60.   -continue effexor 75mg  daily -wellbutrin 100mg  er q12 -increase buspar to 10mg  bid 3. Hydrocodone will continue for breakthrough pain 10/325 q6 prn #120.  .RF today We will continue the controlled substance monitoring program, this consists of regular clinic visits, examinations, routine drug screening, pill counts as well as use of [place] Controlled Substance Reporting System. [place].    -drug swab today 4. Continue with counseling with Dr. Kieth Brightly which has been extremely helpful.        -          -xanax refilled as above -social interaction with friends and perhaps through exercise. - 5. Knee pain. Much improved. -continue diclofenac  50mg  every day prn with food -knee strengthening exercises should continue -weight loss   6 .Follow up in about 2 months . 20 minutes of face to face patient care time were spent during this visit. All questions were encouraged and answered.

## 2023-08-31 LAB — DRUG TOX MONITOR 1 W/CONF, ORAL FLD
Amphetamines: NEGATIVE ng/mL (ref <10)
Barbiturates: NEGATIVE ng/mL (ref <10)
Benzodiazepines: NEGATIVE ng/mL (ref <0.50)
Buprenorphine: NEGATIVE ng/mL (ref <0.10)
Cocaine: NEGATIVE ng/mL (ref <5.0)
Codeine: NEGATIVE ng/mL (ref <2.5)
Dihydrocodeine: 11.5 ng/mL — ABNORMAL HIGH (ref <2.5)
Fentanyl: NEGATIVE ng/mL (ref <0.10)
Heroin Metabolite: NEGATIVE ng/mL (ref <1.0)
Hydrocodone: 122.3 ng/mL — ABNORMAL HIGH (ref <2.5)
Hydromorphone: NEGATIVE ng/mL (ref <2.5)
MARIJUANA: NEGATIVE ng/mL (ref <2.5)
MDMA: NEGATIVE ng/mL (ref <10)
Meprobamate: NEGATIVE ng/mL (ref <2.5)
Methadone: NEGATIVE ng/mL (ref <5.0)
Morphine: NEGATIVE ng/mL (ref <2.5)
Nicotine Metabolite: NEGATIVE ng/mL (ref <5.0)
Norhydrocodone: 6.2 ng/mL — ABNORMAL HIGH (ref <2.5)
Noroxycodone: NEGATIVE ng/mL (ref <2.5)
Opiates: POSITIVE ng/mL — AB (ref <2.5)
Oxycodone: NEGATIVE ng/mL (ref <2.5)
Oxymorphone: NEGATIVE ng/mL (ref <2.5)
Phencyclidine: NEGATIVE ng/mL (ref <10)
Tapentadol: NEGATIVE ng/mL (ref <5.0)
Tramadol: NEGATIVE ng/mL (ref <5.0)
Zolpidem: NEGATIVE ng/mL (ref <5.0)

## 2023-08-31 LAB — DRUG TOX ALC METAB W/CON, ORAL FLD: Alcohol Metabolite: NEGATIVE ng/mL (ref <25)

## 2023-09-23 ENCOUNTER — Other Ambulatory Visit: Payer: Self-pay | Admitting: Registered Nurse

## 2023-09-23 ENCOUNTER — Other Ambulatory Visit: Payer: Self-pay | Admitting: Physical Medicine & Rehabilitation

## 2023-09-23 DIAGNOSIS — F411 Generalized anxiety disorder: Secondary | ICD-10-CM

## 2023-09-27 ENCOUNTER — Telehealth: Payer: Self-pay | Admitting: Registered Nurse

## 2023-09-27 ENCOUNTER — Other Ambulatory Visit: Payer: Self-pay

## 2023-09-27 DIAGNOSIS — M1711 Unilateral primary osteoarthritis, right knee: Secondary | ICD-10-CM

## 2023-09-27 DIAGNOSIS — F0781 Postconcussional syndrome: Secondary | ICD-10-CM

## 2023-09-27 DIAGNOSIS — G894 Chronic pain syndrome: Secondary | ICD-10-CM

## 2023-09-27 MED ORDER — HYDROCODONE-ACETAMINOPHEN 10-325 MG PO TABS
1.0000 | ORAL_TABLET | Freq: Four times a day (QID) | ORAL | 0 refills | Status: DC | PRN
Start: 2023-09-27 — End: 2023-10-25

## 2023-09-27 MED ORDER — METHYLPHENIDATE HCL 10 MG PO TABS
10.0000 mg | ORAL_TABLET | Freq: Two times a day (BID) | ORAL | 0 refills | Status: DC
Start: 2023-09-27 — End: 2023-10-25

## 2023-09-27 NOTE — Telephone Encounter (Signed)
error 

## 2023-09-27 NOTE — Telephone Encounter (Signed)
PMP was Reviewed.  Hydrocodone and Methylphenidate e-scribed to pharmacy.  [name] is aware via My-Chart message.

## 2023-09-27 NOTE — Telephone Encounter (Signed)
Hydrocodone & Ritalin is due on the 09/28/2023. Please review and or advise.   Filled  Written  ID  Drug  QTY  Days  Prescriber  RX #  Dispenser  Refill  Daily Dose*  Pymt Type  PMP  09/23/2023 08/25/2023 1  Alprazolam 0.5 Mg Tablet 60.00 30 Za Swa [id number] Nor ([id number]) 1/2 2.00 LME Private Pay [place] 08/29/2023 08/25/2023 1  Hydrocodone-Acetamin 10-325 Mg 120.00 30 Za Swa [id number] Nor ([id number]) 0/0 40.00 MME Private Pay [place] 08/29/2023 08/25/2023 1  Methylphenidate 10 Mg Tablet 60.00 30 Za Swa [id number] Nor ([id number]) 0/0  Private Pay [facility] 08/27/2023 08/25/2023 1  Alprazolam 0.5 Mg Tablet 60.00 30 Za Swa [id number] Nor ([id number]) 0/2 2.00 LME Private Pay [place]

## 2023-10-04 ENCOUNTER — Other Ambulatory Visit: Payer: Self-pay | Admitting: Registered Nurse

## 2023-10-05 ENCOUNTER — Other Ambulatory Visit: Payer: Self-pay | Admitting: Registered Nurse

## 2023-10-14 ENCOUNTER — Other Ambulatory Visit: Payer: Self-pay | Admitting: Physical Medicine & Rehabilitation

## 2023-10-14 DIAGNOSIS — F329 Major depressive disorder, single episode, unspecified: Secondary | ICD-10-CM

## 2023-10-25 ENCOUNTER — Encounter: Payer: PPO | Attending: Physical Medicine and Rehabilitation | Admitting: Registered Nurse

## 2023-10-25 VITALS — BP 123/78 | HR 71 | Ht 61.0 in | Wt 179.0 lb

## 2023-10-25 DIAGNOSIS — M1711 Unilateral primary osteoarthritis, right knee: Secondary | ICD-10-CM | POA: Diagnosis not present

## 2023-10-25 DIAGNOSIS — F0781 Postconcussional syndrome: Secondary | ICD-10-CM | POA: Diagnosis present

## 2023-10-25 DIAGNOSIS — F332 Major depressive disorder, recurrent severe without psychotic features: Secondary | ICD-10-CM | POA: Insufficient documentation

## 2023-10-25 DIAGNOSIS — Z5181 Encounter for therapeutic drug level monitoring: Secondary | ICD-10-CM | POA: Insufficient documentation

## 2023-10-25 DIAGNOSIS — M47816 Spondylosis without myelopathy or radiculopathy, lumbar region: Secondary | ICD-10-CM | POA: Insufficient documentation

## 2023-10-25 DIAGNOSIS — M47817 Spondylosis without myelopathy or radiculopathy, lumbosacral region: Secondary | ICD-10-CM | POA: Insufficient documentation

## 2023-10-25 DIAGNOSIS — F411 Generalized anxiety disorder: Secondary | ICD-10-CM | POA: Insufficient documentation

## 2023-10-25 DIAGNOSIS — G894 Chronic pain syndrome: Secondary | ICD-10-CM | POA: Diagnosis present

## 2023-10-25 DIAGNOSIS — Z79891 Long term (current) use of opiate analgesic: Secondary | ICD-10-CM | POA: Diagnosis present

## 2023-10-25 MED ORDER — HYDROCODONE-ACETAMINOPHEN 10-325 MG PO TABS: 1.0000 | ORAL_TABLET | Freq: Four times a day (QID) | ORAL | 0 refills | Status: DC | PRN

## 2023-10-25 MED ORDER — METHYLPHENIDATE HCL 10 MG PO TABS: 10.0000 mg | ORAL_TABLET | Freq: Two times a day (BID) | ORAL | 0 refills | Status: DC

## 2023-10-25 NOTE — Progress Notes (Signed)
 Subjective:    Patient ID: [name], female    DOB: 1956-03-22, 68 y.o.   MRN: [id number]  HPI: [name] is a 68 y.o. female who returns for follow up appointment for chronic pain and medication refill. She states her pain is located in her lower back. She rates her pain 8. Her current exercise regime is walking, she was encouraged to increase HEP as Tolerated.   [name] Morphine  equivalent is 40.00 MME. She is also prescribed Alprazolam . .We have discussed the black box warning of using opioids and benzodiazepines. I highlighted the dangers of using these drugs together and discussed the adverse events including respiratory suppression, overdose, cognitive impairment and importance of compliance with current regimen. We will continue to monitor and adjust as indicated.  She is being closely monitored and under the care of her psychologist.     Last Oral Swab was Performed on 08/25/2023, it was consistent.     Pain Inventory Average Pain 8 Pain Right Now 8 My pain is sharp, burning, dull, stabbing, tingling, and aching  In the last 24 hours, has pain interfered with the following? General activity 10 Relation with others 10 Enjoyment of life 10 What TIME of day is your pain at its worst? varies Sleep (in general) Fair  Pain is worse with: walking, bending, standing, and some activites Pain improves with: rest, heat/ice, pacing activities, medication, and injections Relief from Meds: 8  Family History  Problem Relation Age of Onset   Cervical cancer Mother    Diabetes Mother    Stroke Mother    Alzheimer's disease Mother    Heart attack Father    Stroke Father    Colon cancer Father    Anxiety disorder Maternal Aunt    Depression Maternal Aunt    Anxiety disorder Maternal Uncle    Depression Maternal Uncle        suicide attempt by gun shot wound to head. Survived   Alcohol  abuse Other    Drug abuse Other    Breast cancer Maternal Grandmother     Diabetes Maternal Grandmother    Breast cancer Paternal Aunt    Colon polyps Sister        x 2   Social History   Socioeconomic History   Marital status: Married    Spouse name: Not on file   Number of children: 0   Years of education: Not on file   Highest education level: Not on file  Occupational History   Occupation: disabled  Tobacco Use   Smoking status: Never   Smokeless tobacco: Never  Vaping Use   Vaping status: Never Used  Substance and Sexual Activity   Alcohol  use: No    Alcohol /week: 0.0 standard drinks of alcohol    Drug use: No   Sexual activity: Yes  Other Topics Concern   Not on file  Social History Narrative   Pt has h.s. degree   Social Drivers of Health   Financial Resource Strain: Low Risk  (02/15/2023)   Received from [facility], [facility]   Overall Financial Resource Strain (CARDIA)    Difficulty of Paying Living Expenses: Not hard at all  Food Insecurity: No Food Insecurity (02/15/2023)   Received from [facility], [facility]   Hunger Vital Sign    Worried About Running Out of Food in the Last Year: Never true    Ran Out of Food in the Last Year: Never true  Transportation Needs: No Transportation Needs (  02/15/2023)   Received from [facility], [facility]   [facility] - Transportation    Lack of Transportation (Medical): No    Lack of Transportation (Non-Medical): No  Physical Activity: Insufficiently Active (02/15/2023)   Received from [facility], [facility]   Exercise Vital Sign    Days of Exercise per Week: 2 days    Minutes of Exercise per Session: 30 min  Stress: Patient Declined (02/15/2023)   Received from [facility], [facility] of Occupational Health - Occupational Stress Questionnaire    Feeling of Stress : Patient declined  Social Connections: Socially Integrated (02/15/2023)   Received from [facility], [facility]   Social Network    How would you rate your social network (family, work,  friends)?: Good participation with social networks   Past Surgical History:  Procedure Laterality Date   ABDOMINAL HYSTERECTOMY  1999   bunions removed  1988   C4 C5 cage insertion  06/28/2013   CHOLECYSTECTOMY  04/25/2008   EDG  12/17/2004   ELECTROCARDIOGRAM  05/27/2007   L5 S1 rod insertion  11/07/2012   SKIN CANCER EXCISION     SKIN CANCER EXCISION     several   TONSILLECTOMY  1981   TUBAL LIGATION  1997   Past Surgical History:  Procedure Laterality Date   ABDOMINAL HYSTERECTOMY  1999   bunions removed  1988   C4 C5 cage insertion  06/28/2013   CHOLECYSTECTOMY  04/25/2008   EDG  12/17/2004   ELECTROCARDIOGRAM  05/27/2007   L5 S1 rod insertion  11/07/2012   SKIN CANCER EXCISION     SKIN CANCER EXCISION     several   TONSILLECTOMY  1981   TUBAL LIGATION  1997   Past Medical History:  Diagnosis Date   Anemia    ANXIETY 06/10/2007   Cataract    Chronic headaches    Degenerative arthritis of spine    back and neck   Depression    DYSLIPIDEMIA 06/28/2008   Esophageal stricture    Fibromyalgia    Gallstones    GERD 06/10/2007   Hiatal hernia    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   HYPERTENSION 06/10/2007   Irritable bowel syndrome 06/28/2008   OSTEOARTHRITIS 06/10/2007   Skin cancer    basal cell   Tubular adenoma of colon    There were no vitals taken for this visit.  Opioid Risk Score:   Fall Risk Score:  `1  Depression screen PHQ 2/9     08/25/2023    3:05 PM 06/23/2023    2:23 PM 04/28/2023    2:23 PM 12/30/2022    2:57 PM 10/26/2022    1:54 PM 08/26/2022    2:12 PM 05/28/2022    2:14 PM  Depression screen PHQ 2/9  Decreased Interest 1 1 1 1  0 1 0  Down, Depressed, Hopeless 1 1 1 1  0 1 0  PHQ - 2 Score 2 2 2 2  0 2 0     Review of Systems  Genitourinary:  Positive for pelvic pain.  Musculoskeletal:  Positive for back pain.       Bilateral hip pan Upper back of left leg pain  All other systems reviewed and are negative.     Objective:   Physical Exam Vitals and  nursing note reviewed.  Constitutional:      Appearance: Normal appearance.  Cardiovascular:     Rate and Rhythm: Normal rate and regular rhythm.     Pulses: Normal pulses.  Heart sounds: Normal heart sounds.  Pulmonary:     Effort: Pulmonary effort is normal.     Breath sounds: Normal breath sounds.  Musculoskeletal:     Comments: Normal Muscle Bulk and Muscle Testing Reveals:  Upper Extremities: Full ROM and Muscle Strength 5/5   Lumbar Paraspinal Tenderness: L-4-L-5 Lower Extremities: Full ROM and Muscle Strength 5/5 Arises from Chair with ease Narrow Based  Gait     Skin:    General: Skin is warm and dry.  Neurological:     Mental Status: She is alert and oriented to person, place, and time.  Psychiatric:        Mood and Affect: Mood normal.        Behavior: Behavior normal.         Assessment & Plan:  1. History of chronic lumbar facet disease with spondylosis/DDD/ L4-5 spondylolisthesis.With L4-5 L5-S1 decompression stabilization:  She underwent Posterior Lumbar Iinterbody Fusion - Lumabr three-Lumbar four, on 08/01/2021 by Dr Joshua. Neurosurgery Following 10/25/2023 Refilled: Hydrocodone  10mg /325 one tablet every 6 hours as needed for pain #120.  We will continue the opioid monitoring program, this consists of regular clinic visits, examinations, urine drug screen, pill counts as well as use of [place]  Controlled Substance Reporting system. A 12 month History has been reviewed on the [place]  Controlled Substance Reporting System on 10/25/2023. 2. Depression with anxiety/ PTSD:  Dr. Corina Following.Continue Xanax ,Abilify  and Effexor . 10/25/2023 3. Cervical spondylosis: Stable at this time with no complaints. Continue Medication Regime. 10/25/2023 4. 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 Ritalin : Refilled: Ritalin10 mg  One tablet at Breakfast and Lunch. # 45. Continue with  slow weaning.  10/25/2023. 5.  Muscle Spasm: Continue current medication regimen with  Flexeril : May take 1-2 tablets at HS. 10/25/2023 6. Left Lumbar Radiculitis: No complaints today. Continue HEP as Tolerated . Continue to Monitor. 10/25/2023. 7. Left Knee Osteoarthritis: No complaints today. S/P Left Knee Injection on 10/26/2018 with good relief noted.10/25/2023 8. Right Knee Pain: No complaints today. Continue to alternate with ice and heat therapy. Continue to monitor. 10/25/2023 9. Bilateral Greater Trochanter Tenderness: No complaints today. Continue to alternate Ice and Heat therapy. Continue to monitor. 10/25/2023  10. Forgetfulness:  No complaints today. Dr Corina.Following.  Continue to Monitor. 10/25/2023 11. Left Ankle Pain: No complaints today. No Falls since last visit. Continue to Monitor. 10/25/2023      F/U in 2 months

## 2023-10-28 ENCOUNTER — Other Ambulatory Visit: Payer: Self-pay | Admitting: Registered Nurse

## 2023-10-30 ENCOUNTER — Encounter: Payer: Self-pay | Admitting: Registered Nurse

## 2023-11-01 ENCOUNTER — Other Ambulatory Visit: Payer: Self-pay | Admitting: Physical Medicine & Rehabilitation

## 2023-11-01 DIAGNOSIS — F410 Panic disorder [episodic paroxysmal anxiety] without agoraphobia: Secondary | ICD-10-CM

## 2023-11-29 ENCOUNTER — Other Ambulatory Visit: Payer: Self-pay | Admitting: Physical Medicine & Rehabilitation

## 2023-11-29 DIAGNOSIS — F411 Generalized anxiety disorder: Secondary | ICD-10-CM

## 2023-12-22 ENCOUNTER — Encounter: Payer: PPO | Attending: Physical Medicine and Rehabilitation | Admitting: Physical Medicine & Rehabilitation

## 2023-12-22 ENCOUNTER — Encounter: Payer: Self-pay | Admitting: Physical Medicine & Rehabilitation

## 2023-12-22 VITALS — BP 127/65 | HR 66 | Ht 61.0 in | Wt 174.0 lb

## 2023-12-22 DIAGNOSIS — K219 Gastro-esophageal reflux disease without esophagitis: Secondary | ICD-10-CM

## 2023-12-22 DIAGNOSIS — G894 Chronic pain syndrome: Secondary | ICD-10-CM

## 2023-12-22 DIAGNOSIS — F0781 Postconcussional syndrome: Secondary | ICD-10-CM

## 2023-12-22 DIAGNOSIS — M1711 Unilateral primary osteoarthritis, right knee: Secondary | ICD-10-CM

## 2023-12-22 DIAGNOSIS — F410 Panic disorder [episodic paroxysmal anxiety] without agoraphobia: Secondary | ICD-10-CM

## 2023-12-22 MED ORDER — METHYLPHENIDATE HCL 10 MG PO TABS: 10.0000 mg | ORAL_TABLET | Freq: Two times a day (BID) | ORAL | 0 refills | Status: DC

## 2023-12-22 MED ORDER — FAMOTIDINE 20 MG PO TABS: 20.0000 mg | ORAL_TABLET | Freq: Two times a day (BID) | ORAL | 7 refills | Status: DC

## 2023-12-22 MED ORDER — BUSPIRONE HCL 10 MG PO TABS: 10.0000 mg | ORAL_TABLET | Freq: Three times a day (TID) | ORAL | 3 refills | Status: AC

## 2023-12-22 MED ORDER — HYDROCODONE-ACETAMINOPHEN 10-325 MG PO TABS: 1.0000 | ORAL_TABLET | Freq: Four times a day (QID) | ORAL | 0 refills | Status: DC | PRN

## 2023-12-22 NOTE — Progress Notes (Signed)
 Subjective:    Patient ID: [name], female    DOB: [birth year], 68 y.o.   MRN: [id number]  HPI  [name] is here in follow up of her chronic pain. She is struggling again with anxiety. Her primary increased her effexor to 150mg  daily. She last saw Dr. Kieth Brightly in the fall. She is taking buspar 10mg  bid and uses xanax prn. She uses ritalin 10mg  bid for attention.   She is working on her weight. She stopped the phentermine. She stays as active as she can, pain and mood permitting.   She is having ongoing pain in her low back and knee pain. She uses hydrocodone as well as diclofenac for pain control.     Pain Inventory Average Pain 8 Pain Right Now 8 My pain is sharp, dull, and aching  In the last 24 hours, has pain interfered with the following? General activity 10 Relation with others 10 Enjoyment of life 10 What TIME of day is your pain at its worst? varies Sleep (in general) Fair  Pain is worse with: walking, bending, sitting, standing, and some activites Pain improves with: rest, heat/ice, pacing activities, and medication Relief from Meds: 8  Family History  Problem Relation Age of Onset   Cervical cancer Mother    Diabetes Mother    Stroke Mother    Alzheimer's disease Mother    Heart attack Father    Stroke Father    Colon cancer Father    Anxiety disorder Maternal Aunt    Depression Maternal Aunt    Anxiety disorder Maternal Uncle    Depression Maternal Uncle        suicide attempt by gun shot wound to head. Survived   Alcohol abuse Other    Drug abuse Other    Breast cancer Maternal Grandmother    Diabetes Maternal Grandmother    Breast cancer Paternal Aunt    Colon polyps Sister        x 2   Social History   Socioeconomic History   Marital status: Married    Spouse name: Not on file   Number of children: 0   Years of education: Not on file   Highest education level: Not on file  Occupational History   Occupation: disabled  Tobacco Use    Smoking status: Never   Smokeless tobacco: Never  Vaping Use   Vaping status: Never Used  Substance and Sexual Activity   Alcohol use: No    Alcohol/week: 0.0 standard drinks of alcohol   Drug use: No   Sexual activity: Yes  Other Topics Concern   Not on file  Social History Narrative   Pt has h.s. degree   Social Drivers of Health   Financial Resource Strain: Low Risk  (12/18/2023)   Received from [facility]   Overall Financial Resource Strain (CARDIA)    Difficulty of Paying Living Expenses: Not very hard  Food Insecurity: No Food Insecurity (12/18/2023)   Received from [facility]   Hunger Vital Sign    Worried About Running Out of Food in the Last Year: Never true    Ran Out of Food in the Last Year: Never true  Transportation Needs: No Transportation Needs (12/18/2023)   Received from [facility] - Transportation    Lack of Transportation (Medical): No    Lack of Transportation (Non-Medical): No  Physical Activity: Inactive (12/18/2023)   Received from [facility]   Exercise Vital Sign    Days of  Exercise per Week: 0 days    Minutes of Exercise per Session: 30 min  Stress: Stress Concern Present (12/18/2023)   Received from [facility] of Occupational Health - Occupational Stress Questionnaire    Feeling of Stress : Rather much  Social Connections: Socially Isolated (12/18/2023)   Received from [facility]   Social Network    How would you rate your social network (family, work, friends)?: Little participation, lonely and socially isolated   Past Surgical History:  Procedure Laterality Date   ABDOMINAL HYSTERECTOMY  1999   bunions removed  1988   C4 C5 cage insertion  06/28/2013   CHOLECYSTECTOMY  04/25/2008   EDG  12/17/2004   ELECTROCARDIOGRAM  05/27/2007   L5 S1 rod insertion  11/07/2012   SKIN CANCER EXCISION     SKIN CANCER EXCISION     several   TONSILLECTOMY  1981   TUBAL LIGATION  1997   Past Surgical History:   Procedure Laterality Date   ABDOMINAL HYSTERECTOMY  1999   bunions removed  1988   C4 C5 cage insertion  06/28/2013   CHOLECYSTECTOMY  04/25/2008   EDG  12/17/2004   ELECTROCARDIOGRAM  05/27/2007   L5 S1 rod insertion  11/07/2012   SKIN CANCER EXCISION     SKIN CANCER EXCISION     several   TONSILLECTOMY  1981   TUBAL LIGATION  1997   Past Medical History:  Diagnosis Date   Anemia    ANXIETY 06/10/2007   Cataract    Chronic headaches    Degenerative arthritis of spine    back and neck   Depression    DYSLIPIDEMIA 06/28/2008   Esophageal stricture    Fibromyalgia    Gallstones    GERD 06/10/2007   Hiatal hernia    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   HYPERTENSION 06/10/2007   Irritable bowel syndrome 06/28/2008   OSTEOARTHRITIS 06/10/2007   Skin cancer    basal cell   Tubular adenoma of colon    BP 127/65   Pulse 66   Ht 5\' 1"  (1.549 m)   Wt 174 lb (78.9 kg)   SpO2 99%   BMI 32.88 kg/m   Opioid Risk Score:   Fall Risk Score:  `1  Depression screen PHQ 2/9     08/25/2023    3:05 PM 06/23/2023    2:23 PM 04/28/2023    2:23 PM 12/30/2022    2:57 PM 10/26/2022    1:54 PM 08/26/2022    2:12 PM 05/28/2022    2:14 PM  Depression screen PHQ 2/9  Decreased Interest 1 1 1 1  0 1 0  Down, Depressed, Hopeless 1 1 1 1  0 1 0  PHQ - 2 Score 2 2 2 2  0 2 0     Review of Systems  Musculoskeletal:  Positive for back pain.       Bilateral knee pain, front and back  All other systems reviewed and are negative.     Objective:   Physical Exam  General: No acute distress HEENT: NCAT, EOMI, oral membranes moist Cards: reg rate  Chest: normal effort Abdomen: Soft, NT, ND Skin: dry, intact Extremities: no edema Psych: pleasant and appropriate. Seems to be in good spirits   Musculoskeletal: LB tender with ROM. Knees with mild pain.  Neurological: memory and cognition are at baseline. Strength symmetrical. Sensory is decreased in feet?l. DTR's 1+. Sttrength 5/5.            Assessment &  Plan:   ASSESSMENT:  1. 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 s/p L4-5 L5-S1 decompression stabilization with substantial mprovement of her back and right leg pain.  2. Bilateral knee pain, OA  3. Depression with anxiety.  Seasonal exacerbation 4. Cervical spondylosis/Myofascial pain.  5. Concussion on 05/13/14 with post concussion syndrome-   6. 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 Continue ritalin 10mg  for attention/arousal---refilled #60--.    2. on xanax at 0.5mg bid #60.  --no rf needed today -increased effexor to 150 mg daily -wellbutrin 100mg  er q12 -increase buspar to 10mg  tid -work on some strategies for relaxation---hobbies, exercise, yard activities, etc 3. Hydrocodone will continue for breakthrough pain 10/325 q6 prn #120.  .RF today We will continue the controlled substance monitoring program, this consists of regular clinic visits, examinations, routine drug screening, pill counts as well as use of [place] Controlled Substance Reporting System. [place].            - 4. Continue with counseling with Dr. Kieth Brightly which has been extremely helpful.        -          -xanax refilled as above -social interaction with friends and perhaps through exercise. -asked her to address rift with sisters. She doesn't know what it is.  5. Knee pain. Much improved. -continue diclofenac 50mg  every day prn with food  -increase pepcid to 20mg  bid -knee strengthening exercises should continue -weight loss   6 .Follow up in about 2 months . 20 minutes of face to face patient care time were spent during this visit. All questions were encouraged and answered.

## 2023-12-22 NOTE — Patient Instructions (Signed)
 ALWAYS FEEL FREE TO CALL OUR OFFICE WITH ANY PROBLEMS OR QUESTIONS [phone])  **PLEASE NOTE** ALL MEDICATION REFILL REQUESTS (INCLUDING CONTROLLED SUBSTANCES) NEED TO BE MADE AT LEAST 7 DAYS PRIOR TO REFILL BEING DUE. ANY REFILL REQUESTS INSIDE THAT TIME FRAME MAY RESULT IN DELAYS IN RECEIVING YOUR PRESCRIPTION.

## 2024-01-26 ENCOUNTER — Other Ambulatory Visit: Payer: Self-pay

## 2024-01-26 DIAGNOSIS — G894 Chronic pain syndrome: Secondary | ICD-10-CM

## 2024-01-26 DIAGNOSIS — F0781 Postconcussional syndrome: Secondary | ICD-10-CM

## 2024-01-26 DIAGNOSIS — M1711 Unilateral primary osteoarthritis, right knee: Secondary | ICD-10-CM

## 2024-01-26 MED ORDER — HYDROCODONE-ACETAMINOPHEN 10-325 MG PO TABS: 1.0000 | ORAL_TABLET | Freq: Four times a day (QID) | ORAL | 0 refills | Status: DC | PRN

## 2024-01-26 MED ORDER — METHYLPHENIDATE HCL 10 MG PO TABS: 10.0000 mg | ORAL_TABLET | Freq: Two times a day (BID) | ORAL | 0 refills | Status: DC

## 2024-01-27 ENCOUNTER — Other Ambulatory Visit: Payer: Self-pay | Admitting: Registered Nurse

## 2024-02-01 ENCOUNTER — Other Ambulatory Visit: Payer: Self-pay | Admitting: Family Medicine

## 2024-02-01 DIAGNOSIS — Z1231 Encounter for screening mammogram for malignant neoplasm of breast: Secondary | ICD-10-CM

## 2024-02-04 ENCOUNTER — Telehealth: Payer: Self-pay | Admitting: Registered Nurse

## 2024-02-04 NOTE — Telephone Encounter (Signed)
 Dr Rachel Budds note was reviewed.  Call placed to Ms. [place] regarding the Effexor . No answer. Left message to return the call.  Message sent to Dr Rachel Budds, regarding his note.  Awaiting a reply.

## 2024-02-11 ENCOUNTER — Ambulatory Visit
Admission: RE | Admit: 2024-02-11 | Discharge: 2024-02-11 | Disposition: A | Discharge status: Home / Self Care | Source: Ambulatory Visit | Attending: Family Medicine | Admitting: Family Medicine

## 2024-02-11 DIAGNOSIS — Z1231 Encounter for screening mammogram for malignant neoplasm of breast: Secondary | ICD-10-CM

## 2024-02-14 ENCOUNTER — Other Ambulatory Visit: Payer: Self-pay | Admitting: Registered Nurse

## 2024-02-16 ENCOUNTER — Other Ambulatory Visit: Payer: Self-pay | Admitting: Family Medicine

## 2024-02-16 ENCOUNTER — Encounter: Payer: Self-pay | Admitting: Physical Medicine & Rehabilitation

## 2024-02-16 ENCOUNTER — Encounter: Attending: Physical Medicine and Rehabilitation | Admitting: Physical Medicine & Rehabilitation

## 2024-02-16 VITALS — BP 156/82 | HR 72 | Ht 61.0 in | Wt 173.0 lb

## 2024-02-16 DIAGNOSIS — F411 Generalized anxiety disorder: Secondary | ICD-10-CM

## 2024-02-16 DIAGNOSIS — M47812 Spondylosis without myelopathy or radiculopathy, cervical region: Secondary | ICD-10-CM

## 2024-02-16 DIAGNOSIS — G894 Chronic pain syndrome: Secondary | ICD-10-CM

## 2024-02-16 DIAGNOSIS — M47816 Spondylosis without myelopathy or radiculopathy, lumbar region: Secondary | ICD-10-CM | POA: Diagnosis not present

## 2024-02-16 DIAGNOSIS — R928 Other abnormal and inconclusive findings on diagnostic imaging of breast: Secondary | ICD-10-CM

## 2024-02-16 MED ORDER — HYDROCODONE-ACETAMINOPHEN 10-325 MG PO TABS: 1.0000 | ORAL_TABLET | Freq: Four times a day (QID) | ORAL | 0 refills | Status: DC | PRN

## 2024-02-16 MED ORDER — ALPRAZOLAM 0.5 MG PO TABS: 0.5000 mg | ORAL_TABLET | Freq: Two times a day (BID) | ORAL | 2 refills | Status: DC

## 2024-02-16 NOTE — Progress Notes (Addendum)
 Subjective:    Patient ID: [name], female    DOB: Jan 13, 1956, 68 y.o.   MRN: [id number]  HPI [name] is herein follow up of her chronic pain. She has been doing a little better since we last met. Her mood was better with the changes we made to her mood regimen. She's tolerated the medications without issues. She struggles in the relationship with her sister.    Her knee has been improved with diclofenac . She's taking it with food and has no dyspepsia. Hydrocodone  helps for severe pain.   She had a mammogram done 2 weeks ago and a mass was discovered in her right breast.       Pain Inventory Average Pain 8 Pain Right Now 8 My pain is constant, sharp, burning, dull, stabbing, tingling, and aching  In the last 24 hours, has pain interfered with the following? General activity 9 Relation with others 9 Enjoyment of life 8 What TIME of day is your pain at its worst? morning , daytime, evening, and night Sleep (in general) Fair  Pain is worse with: walking, bending, sitting, inactivity, standing, and some activites Pain improves with: rest, medication, and heat Relief from Meds: 7  Family History  Problem Relation Age of Onset   Cervical cancer Mother    Diabetes Mother    Stroke Mother    Alzheimer's disease Mother    Heart attack Father    Stroke Father    Colon cancer Father    Colon polyps Sister        x 2   Anxiety disorder Maternal Aunt    Depression Maternal Aunt    Anxiety disorder Maternal Uncle    Depression Maternal Uncle        suicide attempt by gun shot wound to head. Survived   Breast cancer Paternal Aunt    Breast cancer Maternal Grandmother 69 - 89   Diabetes Maternal Grandmother    Alcohol  abuse Other    Drug abuse Other    Social History   Socioeconomic History   Marital status: Married    Spouse name: Not on file   Number of children: 0   Years of education: Not on file   Highest education level: Not on file  Occupational History    Occupation: disabled  Tobacco Use   Smoking status: Never   Smokeless tobacco: Never  Vaping Use   Vaping status: Never Used  Substance and Sexual Activity   Alcohol  use: No    Alcohol /week: 0.0 standard drinks of alcohol    Drug use: No   Sexual activity: Yes  Other Topics Concern   Not on file  Social History Narrative   Pt has h.s. degree   Social Drivers of Health   Financial Resource Strain: Low Risk  (12/18/2023)   Received from [facility]   Overall Financial Resource Strain (CARDIA)    Difficulty of Paying Living Expenses: Not very hard  Food Insecurity: No Food Insecurity (12/18/2023)   Received from [facility]   Hunger Vital Sign    Worried About Running Out of Food in the Last Year: Never true    Ran Out of Food in the Last Year: Never true  Transportation Needs: No Transportation Needs (12/18/2023)   Received from [facility] - Transportation    Lack of Transportation (Medical): No    Lack of Transportation (Non-Medical): No  Physical Activity: Inactive (12/18/2023)   Received from [facility]   Exercise Vital Sign  Days of Exercise per Week: 0 days    Minutes of Exercise per Session: 30 min  Stress: Stress Concern Present (12/18/2023)   Received from [facility] of Occupational Health - Occupational Stress Questionnaire    Feeling of Stress : Rather much  Social Connections: Socially Isolated (12/18/2023)   Received from [facility]   Social Network    How would you rate your social network (family, work, friends)?: Little participation, lonely and socially isolated   Past Surgical History:  Procedure Laterality Date   ABDOMINAL HYSTERECTOMY  1999   bunions removed  1988   C4 C5 cage insertion  06/28/2013   CHOLECYSTECTOMY  04/25/2008   EDG  12/17/2004   ELECTROCARDIOGRAM  05/27/2007   L5 S1 rod insertion  11/07/2012   SKIN CANCER EXCISION     SKIN CANCER EXCISION     several   TONSILLECTOMY  1981   TUBAL LIGATION  1997    Past Surgical History:  Procedure Laterality Date   ABDOMINAL HYSTERECTOMY  1999   bunions removed  1988   C4 C5 cage insertion  06/28/2013   CHOLECYSTECTOMY  04/25/2008   EDG  12/17/2004   ELECTROCARDIOGRAM  05/27/2007   L5 S1 rod insertion  11/07/2012   SKIN CANCER EXCISION     SKIN CANCER EXCISION     several   TONSILLECTOMY  1981   TUBAL LIGATION  1997   Past Medical History:  Diagnosis Date   Anemia    ANXIETY 06/10/2007   Cataract    Chronic headaches    Degenerative arthritis of spine    back and neck   Depression    DYSLIPIDEMIA 06/28/2008   Esophageal stricture    Fibromyalgia    Gallstones    GERD 06/10/2007   Hiatal hernia    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   HYPERTENSION 06/10/2007   Irritable bowel syndrome 06/28/2008   OSTEOARTHRITIS 06/10/2007   Skin cancer    basal cell   Tubular adenoma of colon    BP (!) 156/82   Pulse 72   Ht 5\' 1"  (1.549 m)   Wt 173 lb (78.5 kg)   SpO2 97%   BMI 32.69 kg/m   Opioid Risk Score:   Fall Risk Score:  `1  Depression screen PHQ 2/9     02/16/2024    3:00 PM 08/25/2023    3:05 PM 06/23/2023    2:23 PM 04/28/2023    2:23 PM 12/30/2022    2:57 PM 10/26/2022    1:54 PM 08/26/2022    2:12 PM  Depression screen PHQ 2/9  Decreased Interest 0 1 1 1 1  0 1  Down, Depressed, Hopeless 0 1 1 1 1  0 1  PHQ - 2 Score 0 2 2 2 2  0 2    Review of Systems  Musculoskeletal:  Positive for back pain.        Pain down the left leg  All other systems reviewed and are negative.      Objective:   Physical Exam General: No acute distress HEENT: NCAT, EOMI, oral membranes moist Cards: reg rate  Chest: normal effort Abdomen: Soft, NT, ND Skin: dry, intact Extremities: no edema Psych: pleasant and appropriate   Musculoskeletal: LB tender with ROM. Knee pain better.   Neurological: memory and cognition are at baseline. Strength symmetrical. Sensory is decreased in feet?l. DTR's 1+. Sttrength 5/5.            Assessment & Plan:  ASSESSMENT:  1. 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 s/p L4-5 L5-S1 decompression stabilization with substantial mprovement of her back and right leg pain.  2. Bilateral knee pain, OA  3. Depression with anxiety.  Seasonal exacerbation 4. Cervical spondylosis/Myofascial pain.  5. Concussion on 05/13/14 with post concussion syndrome-   6. 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 Right breast mass--workup pending           PLAN:  1. Continue ritalin  10mg  for attention/arousal---refilled #60--.    2. on xanax  at 0.5mg bid #60.   -increased effexor  to 150 mg daily -wellbutrin  100mg  er q12 -increase buspar  to 10mg  tid -continue with strategies for relaxation---hobbies, exercise, yard activities, etc 3. Hydrocodone  will continue for breakthrough pain 10/325 q6 prn #120.  .RF today We will continue the controlled substance monitoring program, this consists of regular clinic visits, examinations, routine drug screening, pill counts as well as use of [place]  Controlled Substance Reporting System. [place].   4. Continue with counseling with Dr. Cheryll Corti which has been extremely helpful.        -          -xanax  refilled as above -social interaction with friends and perhaps through exercise. -church is helping her with the relationship with sisters.  5. Knee pain. Much improved. -continue diclofenac  50mg  every day prn with food             -  pepcid  20mg  bid  -asked her to stop at any sign of nausea -knee strengthening exercises should continue -weight loss   6. Follow up in about 2 months . 20 minutes of face to face patient care time were spent during this visit. All questions were encouraged and answered.

## 2024-02-16 NOTE — Patient Instructions (Signed)
 ALWAYS FEEL FREE TO CALL OUR OFFICE WITH ANY PROBLEMS OR QUESTIONS [phone])  **PLEASE NOTE** ALL MEDICATION REFILL REQUESTS (INCLUDING CONTROLLED SUBSTANCES) NEED TO BE MADE AT LEAST 7 DAYS PRIOR TO REFILL BEING DUE. ANY REFILL REQUESTS INSIDE THAT TIME FRAME MAY RESULT IN DELAYS IN RECEIVING YOUR PRESCRIPTION.

## 2024-02-17 ENCOUNTER — Other Ambulatory Visit: Payer: Self-pay | Admitting: Physical Medicine & Rehabilitation

## 2024-02-17 ENCOUNTER — Encounter: Payer: Self-pay | Admitting: Physical Medicine & Rehabilitation

## 2024-02-17 DIAGNOSIS — F0781 Postconcussional syndrome: Secondary | ICD-10-CM

## 2024-02-17 DIAGNOSIS — G894 Chronic pain syndrome: Secondary | ICD-10-CM

## 2024-02-17 DIAGNOSIS — M1711 Unilateral primary osteoarthritis, right knee: Secondary | ICD-10-CM

## 2024-02-18 MED ORDER — METHYLPHENIDATE HCL 10 MG PO TABS: 10.0000 mg | ORAL_TABLET | Freq: Two times a day (BID) | ORAL | 0 refills | Status: DC

## 2024-02-26 ENCOUNTER — Other Ambulatory Visit: Payer: Self-pay | Admitting: Physical Medicine & Rehabilitation

## 2024-02-26 DIAGNOSIS — F329 Major depressive disorder, single episode, unspecified: Secondary | ICD-10-CM

## 2024-03-02 ENCOUNTER — Ambulatory Visit
Admission: RE | Admit: 2024-03-02 | Discharge: 2024-03-02 | Disposition: A | Discharge status: Home / Self Care | Source: Ambulatory Visit | Attending: Family Medicine | Admitting: Family Medicine

## 2024-03-02 DIAGNOSIS — R928 Other abnormal and inconclusive findings on diagnostic imaging of breast: Secondary | ICD-10-CM

## 2024-03-30 ENCOUNTER — Telehealth: Payer: Self-pay | Admitting: *Deleted

## 2024-03-30 DIAGNOSIS — G894 Chronic pain syndrome: Secondary | ICD-10-CM

## 2024-03-30 DIAGNOSIS — M1711 Unilateral primary osteoarthritis, right knee: Secondary | ICD-10-CM

## 2024-03-30 DIAGNOSIS — F0781 Postconcussional syndrome: Secondary | ICD-10-CM

## 2024-03-30 NOTE — Telephone Encounter (Signed)
 Needs refill on her ritalin  and hydrocodone .

## 2024-03-31 MED ORDER — METHYLPHENIDATE HCL 10 MG PO TABS: 10.0000 mg | ORAL_TABLET | Freq: Two times a day (BID) | ORAL | 0 refills | Status: DC

## 2024-03-31 MED ORDER — HYDROCODONE-ACETAMINOPHEN 10-325 MG PO TABS: 1.0000 | ORAL_TABLET | Freq: Four times a day (QID) | ORAL | 0 refills | Status: DC | PRN

## 2024-03-31 NOTE — Telephone Encounter (Signed)
 Rx written and sent to the pharmacy. Thanks!

## 2024-04-26 ENCOUNTER — Encounter: Attending: Physical Medicine and Rehabilitation | Admitting: Physical Medicine & Rehabilitation

## 2024-04-26 ENCOUNTER — Encounter: Payer: Self-pay | Admitting: Physical Medicine & Rehabilitation

## 2024-04-26 VITALS — BP 130/75 | HR 67 | Ht 61.0 in | Wt 173.0 lb

## 2024-04-26 DIAGNOSIS — Z5181 Encounter for therapeutic drug level monitoring: Secondary | ICD-10-CM | POA: Diagnosis present

## 2024-04-26 DIAGNOSIS — R269 Unspecified abnormalities of gait and mobility: Secondary | ICD-10-CM | POA: Insufficient documentation

## 2024-04-26 DIAGNOSIS — G894 Chronic pain syndrome: Secondary | ICD-10-CM | POA: Insufficient documentation

## 2024-04-26 DIAGNOSIS — Z79891 Long term (current) use of opiate analgesic: Secondary | ICD-10-CM | POA: Diagnosis not present

## 2024-04-26 MED ORDER — CYCLOBENZAPRINE HCL 5 MG PO TABS: 5.0000 mg | ORAL_TABLET | Freq: Three times a day (TID) | ORAL | 3 refills | Status: DC

## 2024-04-26 NOTE — Patient Instructions (Addendum)
 ALWAYS FEEL FREE TO CALL OUR OFFICE WITH ANY PROBLEMS OR QUESTIONS [phone])  **PLEASE NOTE** ALL MEDICATION REFILL REQUESTS (INCLUDING CONTROLLED SUBSTANCES) NEED TO BE MADE AT LEAST 7 DAYS PRIOR TO REFILL BEING DUE. ANY REFILL REQUESTS INSIDE THAT TIME FRAME MAY RESULT IN DELAYS IN RECEIVING YOUR PRESCRIPTION.    USE A SMALL BASED CANE

## 2024-04-26 NOTE — Progress Notes (Signed)
 Subjective:    Patient ID: [name], female    DOB: Feb 27, 1956, 68 y.o.   MRN: [id number]  HPI  [name] is here in follow up of her chronic pain, PCS, mood. She has been doing well with the increase in buspar  and wellbutrin  from a pain standpoint.  Sleep has been fair to good.   She has noticed numbness in her left knee after she's been sitting or lying for longer periods of time. It has caused her knee to give out duing those instances. There is no pain. She is walking more with a group and has been more on her feet from a general sense which is positive.    Her back pain can be worse when she is up longer or working but goes away when she rests.   Bowels and bladder are fine.    Her most recent MRI is from 2016: Mild periventricular and scattered subcortical T2 hyperintensities bilaterally are slightly greater than expected for age. The finding is nonspecific but can be seen in the setting of chronic microvascular ischemia, a demyelinating process such as multiple sclerosis, vasculitis, complicated migraine headaches, or as the sequelae of a prior infectious or inflammatory process.   Pain Inventory Average Pain 7 Pain Right Now 8 My pain is sharp, stabbing, and aching  In the last 24 hours, has pain interfered with the following? General activity 10 Relation with others 10 Enjoyment of life 10 What TIME of day is your pain at its worst? varies Sleep (in general) Good  Pain is worse with: walking, inactivity, and unsure Pain improves with: heat/ice, pacing activities, and medication Relief from Meds: 7  Family History  Problem Relation Age of Onset   Cervical cancer Mother    Diabetes Mother    Stroke Mother    Alzheimer's disease Mother    Heart attack Father    Stroke Father    Colon cancer Father    Colon polyps Sister        x 2   Anxiety disorder Maternal Aunt    Depression Maternal Aunt    Anxiety disorder Maternal Uncle    Depression Maternal  Uncle        suicide attempt by gun shot wound to head. Survived   Breast cancer Paternal Aunt    Breast cancer Maternal Grandmother 67 - 89   Diabetes Maternal Grandmother    Alcohol  abuse Other    Drug abuse Other    Social History   Socioeconomic History   Marital status: Married    Spouse name: Not on file   Number of children: 0   Years of education: Not on file   Highest education level: Not on file  Occupational History   Occupation: disabled  Tobacco Use   Smoking status: Never   Smokeless tobacco: Never  Vaping Use   Vaping status: Never Used  Substance and Sexual Activity   Alcohol  use: No    Alcohol /week: 0.0 standard drinks of alcohol    Drug use: No   Sexual activity: Yes  Other Topics Concern   Not on file  Social History Narrative   Pt has h.s. degree   Social Drivers of Health   Financial Resource Strain: Low Risk  (12/18/2023)   Received from [facility]   Overall Financial Resource Strain (CARDIA)    Difficulty of Paying Living Expenses: Not very hard  Food Insecurity: No Food Insecurity (12/18/2023)   Received from [facility]   Hunger Vital Sign  Within the past 12 months, you worried that your food would run out before you got the money to buy more.: Never true    Within the past 12 months, the food you bought just didn't last and you didn't have money to get more.: Never true  Transportation Needs: No Transportation Needs (12/18/2023)   Received from [facility]   PRAPARE - Transportation    Lack of Transportation (Medical): No    Lack of Transportation (Non-Medical): No  Physical Activity: Unknown (12/18/2023)   Received from [facility]   Exercise Vital Sign    On average, how many days per week do you engage in moderate to strenuous exercise (like a brisk walk)?: 0 days    Minutes of Exercise per Session: Not on file  Recent Concern: Physical Activity - Inactive (12/18/2023)   Received from [facility]   Exercise Vital Sign    Days of  Exercise per Week: 0 days    Minutes of Exercise per Session: 30 min  Stress: Stress Concern Present (12/18/2023)   Received from [facility] - Occupational Stress Questionnaire    Feeling of Stress : Rather much  Social Connections: Socially Isolated (12/18/2023)   Received from [facility]   Social Network    How would you rate your social network (family, work, friends)?: Little participation, lonely and socially isolated   Past Surgical History:  Procedure Laterality Date   ABDOMINAL HYSTERECTOMY  1999   bunions removed  1988   C4 C5 cage insertion  06/28/2013   CHOLECYSTECTOMY  04/25/2008   EDG  12/17/2004   ELECTROCARDIOGRAM  05/27/2007   L5 S1 rod insertion  11/07/2012   SKIN CANCER EXCISION     SKIN CANCER EXCISION     several   TONSILLECTOMY  1981   TUBAL LIGATION  1997   Past Surgical History:  Procedure Laterality Date   ABDOMINAL HYSTERECTOMY  1999   bunions removed  1988   C4 C5 cage insertion  06/28/2013   CHOLECYSTECTOMY  04/25/2008   EDG  12/17/2004   ELECTROCARDIOGRAM  05/27/2007   L5 S1 rod insertion  11/07/2012   SKIN CANCER EXCISION     SKIN CANCER EXCISION     several   TONSILLECTOMY  1981   TUBAL LIGATION  1997   Past Medical History:  Diagnosis Date   Anemia    ANXIETY 06/10/2007   Cataract    Chronic headaches    Degenerative arthritis of spine    back and neck   Depression    DYSLIPIDEMIA 06/28/2008   Esophageal stricture    Fibromyalgia    Gallstones    GERD 06/10/2007   Hiatal hernia    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   HYPERTENSION 06/10/2007   Irritable bowel syndrome 06/28/2008   OSTEOARTHRITIS 06/10/2007   Skin cancer    basal cell   Tubular adenoma of colon    BP 130/75   Pulse 67   Ht 5' 1 (1.549 m)   Wt 173 lb (78.5 kg)   SpO2 97%   BMI 32.69 kg/m   Opioid Risk Score:   Fall Risk Score:  `1  Depression screen PHQ 2/9     04/26/2024    2:49 PM 02/16/2024    3:00 PM 08/25/2023    3:05 PM  06/23/2023    2:23 PM 04/28/2023    2:23 PM 12/30/2022    2:57 PM 10/26/2022    1:54 PM  Depression screen PHQ  2/9  Decreased Interest 1 0 1 1 1 1  0  Down, Depressed, Hopeless 1 0 1 1 1 1  0  PHQ - 2 Score 2 0 2 2 2 2  0    Review of Systems  Musculoskeletal:        Left leg pain, left knee pain  Skin:  Positive for pallor.       Both feet are purple in color  Neurological:  Positive for weakness and numbness.       Left leg weakness & numbness that give out.   All other systems reviewed and are negative.      Objective:   Physical Exam General: No acute distress HEENT: NCAT, EOMI, oral membranes moist Cards: reg rate  Chest: normal effort Abdomen: Soft, NT, ND Skin: dry, intact Extremities: no edema Psych: pleasant and appropriate   Musculoskeletal: LB tender with ROM. Knee pain better.   Neurological: memory and cognition are at baseline. Strength symmetrical AND 5/5.  Sensory is decreased in feet? Shuffling gait. Hyperreflexic throughout at 3+. Normal cn.            Assessment & Plan:   ASSESSMENT:  1. 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 s/p L4-5 L5-S1 decompression stabilization with substantial mprovement of her back and right leg pain.  2. Bilateral knee pain, OA  3. Depression with anxiety.  Seasonal exacerbation 4. Cervical spondylosis/Myofascial pain.  5. Concussion on 05/13/14 with post concussion syndrome-   6. 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 Right breast mass--workup pending           PLAN:  1. Continue ritalin  10mg  for attention/arousal---refilled #60--.    2. on xanax  at 0.5mg bid #60.   -continue effexor   150 mg daily -wellbutrin  100mg  er q12 - buspar   10mg  tid -continue with social reintegration.  3. Hydrocodone  will continue for breakthrough pain 10/325 q6 prn #120.  .RF today We will continue the controlled substance monitoring program, this consists of  regular clinic visits, examinations, routine drug screening, pill counts as well as use of [place]  Controlled Substance Reporting System. [place].   4. Continue with counseling with Dr. Corina which has been extremely helpful.        -          -xanax  refilled as above -social interaction with friends and perhaps through exercise. -stay active with church and friends. .  5. Knee pain. Much improved. -continue diclofenac  50mg  every day prn with food             -  pepcid  20mg  bid             -asked her to stop at any sign of nausea -knee strengthening exercises should continue -weight loss 6. Shuffling gait, LOB, hyperreflexia  -this has progressed over the last year. I don't recall her gait looking like this before         -check MRI to r/o vasculitis, MS. Prior MRI from 2016 questioned these among other dx.  -needs to use small based cane for balance.   7. Follow up in about 2 months . 20 minutes of face to face patient care time were spent during this visit. All questions were encouraged and answered.         Assessment & Plan:

## 2024-04-28 ENCOUNTER — Other Ambulatory Visit: Payer: Self-pay | Admitting: Registered Nurse

## 2024-05-01 ENCOUNTER — Telehealth: Payer: Self-pay | Admitting: *Deleted

## 2024-05-01 DIAGNOSIS — F0781 Postconcussional syndrome: Secondary | ICD-10-CM

## 2024-05-01 DIAGNOSIS — G894 Chronic pain syndrome: Secondary | ICD-10-CM

## 2024-05-01 DIAGNOSIS — M1711 Unilateral primary osteoarthritis, right knee: Secondary | ICD-10-CM

## 2024-05-01 MED ORDER — METHYLPHENIDATE HCL 10 MG PO TABS: 10.0000 mg | ORAL_TABLET | Freq: Two times a day (BID) | ORAL | 0 refills | Status: DC

## 2024-05-01 MED ORDER — HYDROCODONE-ACETAMINOPHEN 10-325 MG PO TABS: 1.0000 | ORAL_TABLET | Freq: Four times a day (QID) | ORAL | 0 refills | Status: DC | PRN

## 2024-05-01 NOTE — Telephone Encounter (Signed)
 Per [name], Dr Babs did not send in my hydrocodone  and methylphenidate  after my last appointment.[name]

## 2024-05-01 NOTE — Telephone Encounter (Signed)
 She had told me she was ok, and I didn't double check. I sent both in.    Thx

## 2024-05-02 LAB — DRUG TOX MONITOR 1 W/CONF, ORAL FLD
Amphetamines: NEGATIVE ng/mL (ref <10)
Barbiturates: NEGATIVE ng/mL (ref <10)
Benzodiazepines: NEGATIVE ng/mL (ref <0.50)
Buprenorphine: NEGATIVE ng/mL (ref <0.10)
Cocaine: NEGATIVE ng/mL (ref <5.0)
Codeine: NEGATIVE ng/mL (ref <2.5)
Dihydrocodeine: 5.3 ng/mL — ABNORMAL HIGH (ref <2.5)
Fentanyl: NEGATIVE ng/mL (ref <0.10)
Heroin Metabolite: NEGATIVE ng/mL (ref <1.0)
Hydrocodone: 48.8 ng/mL — ABNORMAL HIGH (ref <2.5)
Hydromorphone: NEGATIVE ng/mL (ref <2.5)
MARIJUANA: NEGATIVE ng/mL (ref <2.5)
MDMA: NEGATIVE ng/mL (ref <10)
Meprobamate: NEGATIVE ng/mL (ref <2.5)
Methadone: NEGATIVE ng/mL (ref <5.0)
Morphine: NEGATIVE ng/mL (ref <2.5)
Nicotine Metabolite: NEGATIVE ng/mL (ref <5.0)
Norhydrocodone: 3.1 ng/mL — ABNORMAL HIGH (ref <2.5)
Noroxycodone: NEGATIVE ng/mL (ref <2.5)
Opiates: POSITIVE ng/mL — AB (ref <2.5)
Oxycodone: NEGATIVE ng/mL (ref <2.5)
Oxymorphone: NEGATIVE ng/mL (ref <2.5)
Phencyclidine: NEGATIVE ng/mL (ref <10)
Tapentadol: NEGATIVE ng/mL (ref <5.0)
Tramadol: NEGATIVE ng/mL (ref <5.0)
Zolpidem: NEGATIVE ng/mL (ref <5.0)

## 2024-05-02 LAB — DRUG TOX METHYLPHEN W/CONF,ORAL FLD
Methylphenidate: 6.3 ng/mL — ABNORMAL HIGH (ref <1.0)
Methylphenidate: POSITIVE ng/mL — AB (ref <1.0)

## 2024-05-02 LAB — DRUG TOX ALC METAB W/CON, ORAL FLD: Alcohol Metabolite: NEGATIVE ng/mL (ref <25)

## 2024-05-05 ENCOUNTER — Ambulatory Visit
Admission: RE | Admit: 2024-05-05 | Discharge: 2024-05-05 | Disposition: A | Discharge status: Home / Self Care | Source: Ambulatory Visit | Attending: Physical Medicine & Rehabilitation | Admitting: Physical Medicine & Rehabilitation

## 2024-05-05 DIAGNOSIS — R269 Unspecified abnormalities of gait and mobility: Secondary | ICD-10-CM

## 2024-05-15 ENCOUNTER — Encounter: Payer: Self-pay | Admitting: Physical Medicine & Rehabilitation

## 2024-05-24 ENCOUNTER — Telehealth: Payer: Self-pay

## 2024-05-24 NOTE — Telephone Encounter (Signed)
 [name] called to see what her next step should be? She wait for your call back.  No details given.   Call back phone [phone].

## 2024-05-26 ENCOUNTER — Ambulatory Visit: Payer: Self-pay | Admitting: Physical Medicine & Rehabilitation

## 2024-05-26 DIAGNOSIS — F0781 Postconcussional syndrome: Secondary | ICD-10-CM

## 2024-05-26 DIAGNOSIS — R413 Other amnesia: Secondary | ICD-10-CM

## 2024-05-26 NOTE — Telephone Encounter (Signed)
 Hi [name]!  I talked with [name] today about her MRI. I made a referral for formal neuro-psych testing. I was hoping this could be completed prior her September visit with Dr. Corina.   Thanks!

## 2024-05-30 ENCOUNTER — Telehealth: Payer: Self-pay | Admitting: Registered Nurse

## 2024-05-30 DIAGNOSIS — M1711 Unilateral primary osteoarthritis, right knee: Secondary | ICD-10-CM

## 2024-05-30 DIAGNOSIS — G894 Chronic pain syndrome: Secondary | ICD-10-CM

## 2024-05-30 DIAGNOSIS — F0781 Postconcussional syndrome: Secondary | ICD-10-CM

## 2024-05-30 DIAGNOSIS — F4310 Post-traumatic stress disorder, unspecified: Secondary | ICD-10-CM

## 2024-05-30 DIAGNOSIS — F411 Generalized anxiety disorder: Secondary | ICD-10-CM

## 2024-05-30 NOTE — Telephone Encounter (Signed)
 Patient needs refill on Hydrocodone , Alprazolam  and methylphenidate  sent to [facility] at [facility].

## 2024-05-31 ENCOUNTER — Other Ambulatory Visit: Payer: Self-pay | Admitting: Physical Medicine & Rehabilitation

## 2024-05-31 DIAGNOSIS — F411 Generalized anxiety disorder: Secondary | ICD-10-CM

## 2024-05-31 MED ORDER — ALPRAZOLAM 0.5 MG PO TABS: 0.5000 mg | ORAL_TABLET | Freq: Two times a day (BID) | ORAL | 2 refills | Status: DC

## 2024-05-31 MED ORDER — HYDROCODONE-ACETAMINOPHEN 10-325 MG PO TABS: 1.0000 | ORAL_TABLET | Freq: Four times a day (QID) | ORAL | 0 refills | Status: DC | PRN

## 2024-05-31 MED ORDER — METHYLPHENIDATE HCL 10 MG PO TABS: 10.0000 mg | ORAL_TABLET | Freq: Two times a day (BID) | ORAL | 0 refills | Status: DC

## 2024-05-31 NOTE — Telephone Encounter (Signed)
 Rxs sent

## 2024-06-19 ENCOUNTER — Other Ambulatory Visit: Payer: Self-pay

## 2024-06-19 DIAGNOSIS — G894 Chronic pain syndrome: Secondary | ICD-10-CM

## 2024-06-19 DIAGNOSIS — M1711 Unilateral primary osteoarthritis, right knee: Secondary | ICD-10-CM

## 2024-06-19 DIAGNOSIS — F0781 Postconcussional syndrome: Secondary | ICD-10-CM

## 2024-06-21 MED ORDER — METHYLPHENIDATE HCL 10 MG PO TABS: 10.0000 mg | ORAL_TABLET | Freq: Two times a day (BID) | ORAL | 0 refills | Status: DC

## 2024-06-26 ENCOUNTER — Encounter: Payer: Self-pay | Admitting: Podiatry

## 2024-06-26 ENCOUNTER — Ambulatory Visit (INDEPENDENT_AMBULATORY_CARE_PROVIDER_SITE_OTHER): Admitting: Podiatry

## 2024-06-26 ENCOUNTER — Ambulatory Visit (INDEPENDENT_AMBULATORY_CARE_PROVIDER_SITE_OTHER)

## 2024-06-26 VITALS — Ht 61.0 in | Wt 173.0 lb

## 2024-06-26 DIAGNOSIS — M7752 Other enthesopathy of left foot: Secondary | ICD-10-CM | POA: Diagnosis not present

## 2024-06-26 DIAGNOSIS — G629 Polyneuropathy, unspecified: Secondary | ICD-10-CM | POA: Diagnosis not present

## 2024-06-26 DIAGNOSIS — M778 Other enthesopathies, not elsewhere classified: Secondary | ICD-10-CM

## 2024-06-26 DIAGNOSIS — M7751 Other enthesopathy of right foot: Secondary | ICD-10-CM | POA: Diagnosis not present

## 2024-06-26 MED ORDER — TRIAMCINOLONE ACETONIDE 10 MG/ML IJ SUSP
10.0000 mg | Freq: Once | INTRAMUSCULAR | Status: AC
  Administered 2024-06-26: 10 mg via INTRA_ARTICULAR

## 2024-06-28 ENCOUNTER — Other Ambulatory Visit: Payer: Self-pay

## 2024-06-28 DIAGNOSIS — G894 Chronic pain syndrome: Secondary | ICD-10-CM

## 2024-06-28 MED ORDER — HYDROCODONE-ACETAMINOPHEN 10-325 MG PO TABS: 1.0000 | ORAL_TABLET | Freq: Four times a day (QID) | ORAL | 0 refills | Status: DC | PRN

## 2024-06-28 NOTE — Progress Notes (Signed)
 Subjective:   Patient ID: [name], female   DOB: 68 y.o.   MRN: [id number]   HPI Patient presents with pain in both ankles and states that this has been going on for a while and does not remember injury.  Patient also has neuropathy which can be very bothersome for her and just generalized foot pain.  Patient has good digital perfusion patient does not smoke likes to be active   Review of Systems  All other systems reviewed and are negative.       Objective:  Physical Exam Vitals and nursing note reviewed.  Constitutional:      Appearance: She is well-developed.  Pulmonary:     Effort: Pulmonary effort is normal.  Musculoskeletal:        General: Normal range of motion.  Skin:    General: Skin is warm.  Neurological:     Mental Status: She is alert.     Neurovascular status indicated good vascular flow mild loss of sharp dull vibratory bilateral with the patient noted to have inflammation pain which seems to emanate from the sinus tarsi of both feet and also generalized foot pain which is difficult to identify where it comes from.  Patient has well oriented structure      Assessment:  Hoping that this is an inflammatory condition and more capsular than anything else versus neuropathic or other pathology with capsulitis of the sinus tarsi bilateral present     Plan:  H&P x-rays taken reviewed condition and today I injected the sinus tarsi bilateral 3 mg Kenalog  5 mg Xylocaine  advised on anti-inflammatories and we may consider gabapentin .  Patient will be seen back to reevaluate all questions answered today we will see 6 weeks how she has responded to this treatment plan so far  X-rays were negative for signs of arthritis of the subtalar or ankle joint bilateral with no indication of stress fracture

## 2024-06-29 ENCOUNTER — Encounter: Attending: Physical Medicine and Rehabilitation | Admitting: Psychology

## 2024-06-29 ENCOUNTER — Telehealth: Payer: Self-pay | Admitting: *Deleted

## 2024-06-29 DIAGNOSIS — F4310 Post-traumatic stress disorder, unspecified: Secondary | ICD-10-CM | POA: Insufficient documentation

## 2024-06-29 DIAGNOSIS — G894 Chronic pain syndrome: Secondary | ICD-10-CM | POA: Diagnosis present

## 2024-06-29 DIAGNOSIS — F0781 Postconcussional syndrome: Secondary | ICD-10-CM | POA: Diagnosis not present

## 2024-06-29 DIAGNOSIS — F411 Generalized anxiety disorder: Secondary | ICD-10-CM | POA: Insufficient documentation

## 2024-06-29 DIAGNOSIS — R413 Other amnesia: Secondary | ICD-10-CM | POA: Diagnosis present

## 2024-06-29 NOTE — Telephone Encounter (Signed)
 Dr Corina is asking for an order for an ATW profile and APOE4 genotyping labs to be ordered for [facility].

## 2024-06-30 NOTE — Telephone Encounter (Signed)
 I have ordered ATN and APOE4 labs thru [facility]. Alfonso should be able to go to any location to have these drawn.    Thanks

## 2024-07-06 ENCOUNTER — Other Ambulatory Visit: Payer: Self-pay | Admitting: Physical Medicine & Rehabilitation

## 2024-07-06 DIAGNOSIS — K219 Gastro-esophageal reflux disease without esophagitis: Secondary | ICD-10-CM

## 2024-07-06 DIAGNOSIS — M47817 Spondylosis without myelopathy or radiculopathy, lumbosacral region: Secondary | ICD-10-CM

## 2024-07-06 DIAGNOSIS — G894 Chronic pain syndrome: Secondary | ICD-10-CM

## 2024-07-10 ENCOUNTER — Encounter (HOSPITAL_BASED_OUTPATIENT_CLINIC_OR_DEPARTMENT_OTHER)

## 2024-07-10 DIAGNOSIS — F0781 Postconcussional syndrome: Secondary | ICD-10-CM | POA: Diagnosis not present

## 2024-07-10 DIAGNOSIS — R413 Other amnesia: Secondary | ICD-10-CM

## 2024-07-10 DIAGNOSIS — F4310 Post-traumatic stress disorder, unspecified: Secondary | ICD-10-CM

## 2024-07-10 DIAGNOSIS — G894 Chronic pain syndrome: Secondary | ICD-10-CM | POA: Diagnosis not present

## 2024-07-10 DIAGNOSIS — F411 Generalized anxiety disorder: Secondary | ICD-10-CM

## 2024-07-11 ENCOUNTER — Encounter: Payer: Self-pay | Admitting: Psychology

## 2024-07-11 NOTE — Progress Notes (Signed)
 Neuropsychological Consultation   Patient:   [name]   DOB:   07-18-1956  MR Number:  [id number]  Location:  [facility] PH[facility] MEDICINE AND REHABILITATION [address] [place], [address] [place] [address] Dept: [phone]           Date of Service:   06/29/2024  Location of Service and Individuals present: Today's visit was conducted in my outpatient clinic office with the patient myself present.  Start Time:   2 PM End Time:   4 PM  1 hour and 15 minutes was spent in face-to-face clinical interview and the other 45 minutes was spent with records review, report writing and setting up testing protocols.  Patient Consent and Confidentiality: Limits of confidentiality reviewed including the purpose of our current visit regarding a formal neuropsychological assessment due to reports of progressive worsening in cognition and in particular memory.  Patient consents to proceed with the evaluation.  Consent for Evaluation and Treatment:  Signed:  Yes Explanation of Privacy Policies:  Signed:  Yes Discussion of Confidentiality Limits:  Yes  Provider/Observer:  Norleen Asa, Psy.D.       Clinical Neuropsychologist       Billing Code/Service: [id number]  Chief Complaint:     Chief Complaint  Patient presents with   Memory Loss   Anxiety   Depression   Pain   Sleeping Problem   Stress    Reason for Service:    [name] presents as a 68 year old female who has been seen for some time for psychotherapeutic interventions around chronic pain and posttraumatic stress disorder and postconcussion syndrome.  This is a follow-up assessment regarding cognitive changes, referred by Dr. Babs. The individual reports noticing memory changes for over a year, with recent, rapid worsening. They express concern about similarities to their mother's dementia and Alzheimer's disease. Primary complaints include difficulty  retaining new information, word-finding difficulties, geographic disorientation, and a distorted sense of time. These functional difficulties have led to cessation of driving, per their husband's Bronwen) concern. Cueing is not consistently helpful for recall. There is a family history of dementia/Alzheimer's (mother). There is no history of hallucinations. Personality is noted to be more irritable and prone to anger, which is a change. Sleep is generally good. A history of tremors is noted, managed with propranolol . The tremors are exacerbated by stress.  Onset and Duration of Symptoms: Patient reports that the symptoms have been progressing and worsening and memory functions for approximately 1 year.  Progression of Symptoms:  The patient and her husband husband report that symptoms have been present for over a year but have become more frequent and are progressing too fast recently.  Neuroimaging Results:  An MRI was completed, ordered by Dr. Babs. Findings showed mild to moderate chronic microvascular ischemic changes, which are considered non-specific but could contribute to symptoms. No acute or alarming findings were noted.    Laboratory Tests:  Recent CBC and metabolic panels from Monday of this week were reviewed and are unremarkable. Blood work from July showed medication adherence. Lab work from three years prior showed abnormalities in hemoglobin and hematocrit, which have since resolved. B12 and B1 (thiamin) levels checked in 2023 were within normal limits. A1C is well-controlled.   Medical History:   Past Medical History:  Diagnosis Date   Anemia    ANXIETY 06/10/2007   Cataract    Chronic headaches    Degenerative arthritis of spine  back and neck   Depression    DYSLIPIDEMIA 06/28/2008   Esophageal stricture    Fibromyalgia    Gallstones    GERD 06/10/2007   Hiatal hernia    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   HYPERTENSION 06/10/2007   Irritable bowel syndrome 06/28/2008    OSTEOARTHRITIS 06/10/2007   Skin cancer    basal cell   Tubular adenoma of colon          Patient Active Problem List   Diagnosis Date Noted   Gait disorder 04/26/2024   MDD (major depressive disorder), recurrent severe, 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/2023   Osteoarthritis of left knee 12/30/2022   S/P lumbar fusion 08/01/2021   COVID-19 virus infection 10/24/2020   Hypertensive retinopathy 10/23/2019   Chronic narcotic use 09/20/2018   Tubular adenoma of colon    Fibromyalgia    Chronic pain syndrome 04/27/2018   Primary osteoarthritis of right knee 03/31/2016   Osteopenia after menopause 08/17/2014   Panic disorder without agoraphobia 07/18/2014   PTSD (post-traumatic stress disorder) 03/13/2014   GAD (generalized anxiety disorder) 03/13/2014   Cervical spondylosis without myelopathy 12/02/2012   Lumbosacral spondylosis without myelopathy 06/07/2012   Lumbar facet arthropathy 02/05/2012   Hypothyroidism 08/05/2011   Hyperlipidemia 06/28/2008   Irritable bowel syndrome 06/28/2008   Essential hypertension 06/10/2007   GERD 06/10/2007     Behavioral Observation/Mental Status:   GRACIELLA ARMENT  presents alert and cooperative. Participation and motivation are good. Attention and concentration appear intact for conversation.  The patient reports recent memory difficulties, particularly with new information, names, and numbers. Speech is of normal volume and quality, though they report subjective word-finding difficulties. Thought process is coherent and organized. Thought content is significant for frustration and anger related to cognitive deficits. Denies suicidal or homicidal ideation. Orientation appears intact. Judgment and planning are impacted by memory deficits, as evidenced by misplacing a controlled substance prescription. Affect is congruent with mood, which is described as frustrated and worried. Insight into deficits is present. Intelligence appears average.   Marital  Status/Living:  Currently lives with their husband, [name]. They have been married for a significant duration.  Psychiatric History:  Patient has a long history of difficulties with postconcussion syndrome and PTSD along with anxiety and depressive type symptoms and significant life stressors.  History of Substance Use or Abuse:  No concerns of substance abuse are reported.    Family Med/Psych History:  Family History  Problem Relation Age of Onset   Cervical cancer Mother    Diabetes Mother    Stroke Mother    Alzheimer's disease Mother    Heart attack Father    Stroke Father    Colon cancer Father    Colon polyps Sister        x 2   Anxiety disorder Maternal Aunt    Depression Maternal Aunt    Anxiety disorder Maternal Uncle    Depression Maternal Uncle        suicide attempt by gun shot wound to head. Survived   Breast cancer Paternal Aunt    Breast cancer Maternal Grandmother 68 - 89   Diabetes Maternal Grandmother    Alcohol  abuse Other    Drug abuse Other     Impression/DX:   Chelsa a [place] presents for evaluation of cognitive decline, referred by Dr. Babs. They report a greater than one-year history of memory problems that have recently worsened, characterized by difficulty retaining new information, word-finding issues, geographic disorientation, and distorted time perception. These deficits have  resulted in no longer driving. Cueing for recall is inconsistently effective. There is a maternal history of dementia/Alzheimer's. The individual also reports misplacing a methylphenidate  prescription, causing significant distress.  Disposition/Plan:                      The patient is scheduled for a four-hour block of neuropsychological testing with the psychometrician, Annalise. Testing will include measures of various cognitive domains, such as the Wechsler Adult Intelligence Scale, Wechsler Memory Scale, and word association tests. The purpose and process of the testing were  explained, including the fact that tests are designed to become progressively harder and that missing items is an expected part of the assessment. A formal report will be generated and made available in the EMR. Results will be provided to the referring physician, Dr. Babs, and the neurology team. A follow-up appointment will be scheduled to discuss the results. An order will be placed for an ATN profile and APOE4 gene testing to further investigate potential risk factors. The importance of diet (avoiding refined carbohydrates; increasing whole grains, vegetables, and spices like turmeric) for managing inflammation and pain was discussed.   Diagnosis:    Memory loss  Post concussion syndrome  Chronic pain syndrome  GAD (generalized anxiety disorder)  PTSD (post-traumatic stress disorder)        Note: This document was prepared using Dragon voice recognition software and may include unintentional dictation errors.   Electronically Signed   _______________________ Norleen Asa, Psy.D. Clinical Neuropsychologist

## 2024-07-11 NOTE — Progress Notes (Signed)
 Behavioral Observations:  The patient appeared well-groomed and appropriately dressed. Her manners were polite and appropriate to the situation. The patient's attitude towards testing was positive and she demonstrated a good effort. She occasionally reported a feeling of going blank throughout the testing session and seemed to criticize herself whenever this occurred.   Neuropsychology Note  [name] completed 150 minutes of neuropsychological testing with technician, Josue Ned, BA, under the supervision of Norleen Asa, PsyD., Clinical Neuropsychologist. The patient did not appear overtly distressed by the testing session, per behavioral observation or via self-report to the technician. Rest breaks were offered.   Clinical Decision Making: In considering the patient's current level of functioning, level of presumed impairment, nature of symptoms, emotional and behavioral responses during clinical interview, level of literacy, and observed level of motivation/effort, a battery of tests was selected by Dr. Asa during initial consultation on 06/29/2024. This was communicated to the technician. Communication between the neuropsychologist and technician was ongoing throughout the testing session and changes were made as deemed necessary based on patient performance on testing, technician observations and additional pertinent factors such as those listed above.  Tests Administered: Controlled Oral Word Association Test (COWAT; FAS & Animals)  Grooved Pegboard Trail Making Test (TMT; Part A & B) Wechsler Adult Intelligence Scale, 4th Edition (WAIS-IV) Wechsler Memory Scale, 4th Edition (WMS-IV); Adult Battery   Results:  COWAT: FAS total= 25 Z= -1.0 Animals total= 9  Z= -1.83  Grooved Pegboard:  ** Pt is ambidextrous, but uses her right hand to write.  R (DH) time= 135s Drops= 0  Percentile Rank= 26th L (NDH) time= 112s Drops= 0 Percentile Rank= 34th  TMT:   Trails A time= 45s Errors= 0  Percentile Rank= 44th Trails B time= 154s Errors= 1  Percentile Rank= 39th  WAIS-IV:   Composite Score Summary  Scale Sum of Scaled Scores Composite Score Percentile Rank 95% Conf. Interval Qualitative Description  Verbal Comprehension 20 VCI 81 10 76-87 Low Average  Perceptual Reasoning 19 PRI 79 8 74-86 Borderline  Working Memory 13 WMI 80 9 74-88 Low Average  Processing Speed 10 PSI 74 4 68-85 Borderline  Full Scale 62 FSIQ 74 4 70-79 Borderline  General Ability 39 GAI 78 7 74-84 Borderline   Verbal Comprehension Subtests Summary  Subtest Raw Score Scaled Score Percentile Rank Reference Group Scaled Score SEM  Similarities 17 7 16 6  1.04  Vocabulary 28 8 25 8  0.67  Information 6 5 5 6  0.73  The scaled scores in the Reference Group Scaled Score column are based on the performance of examinees aged 20:0-34:11 (i.e., the reference group). See Chapter 6 of the WAIS-IV Technical and Interpretive Manual for more information.  Perceptual Reasoning Subtests Summary  Subtest Raw Score Scaled Score Percentile Rank Reference Group Scaled Score SEM  Block Design 24 8 25 6  1.08  Matrix Reasoning 5 5 5 2  0.90  Visual Puzzles 6 6 9 4  0.85   Working Librarian, academic Raw Score Scaled Score Percentile Rank Reference Group Scaled Score SEM  Digit Span 22 8 25 7  0.79  Arithmetic 8 5 5 5  0.99   Processing Speed Subtests Summary  Subtest Raw Score Scaled Score Percentile Rank Reference Group Scaled Score SEM  Symbol Search 12 4 2 3  1.31  Coding 33 6 9 3  0.99   WMS-IV:   Index Score Summary  Index Sum of Scaled Scores Index Score Percentile Rank 95% Confidence Interval Tour[employer] information [employer] manager Memory (  AMI) 25 78 7 73-85 Borderline  Visual Memory (VMI) 17 63 1 59-70 Extremely Low  Visual Working Memory (VWMI) 14 83 13 77-91 Low Average  Immediate Memory (IMI) 20 67 1 62-75 Extremely Low  Delayed Memory (DMI) 22 69 2 64-78  Extremely Low    Primary Subtest Scaled Score Summary  Subtest Domain Raw Score Scaled Score Percentile Rank  Logical Memory I AM 17 6 9   Logical Memory II AM 5 3 1   Verbal Paired Associates I AM 16 7 16   Verbal Paired Associates II AM 7 9 37  Designs I VM 36 2 0.4  Designs II VM 44 9 37  Visual Reproduction I VM 21 5 5   Visual Reproduction II VM 0 1 0.1  Spatial Addition VWM 6 7 16    Auditory Memory Process Score Summary  Process Score Raw Score Scaled Score Percentile Rank Cumulative Percentage (Base Rate)  LM II Recognition 23 - - 26-50%  VPA II Recognition 35 - - 17-25%   Visual Memory Process Score Summary  Process Score Raw Score Scaled Score Percentile Rank Cumulative Percentage (Base Rate)  DE I Content 25 6 9  -  DE I Spatial 5 2 0.4 -  DE II Content 28 8 25  -  DE II Spatial 12 10 50 -  DE II Recognition 13 - - 26-50%  VR II Recognition 3 - - 10-16%   ABILITY-MEMORY ANALYSIS  Ability Score:  VCI: 81 Date of Testing:  WAIS-IV; WMS-IV 2024/07/10  Predicted Difference Method   Index Predicted WMS-IV Index Score Actual WMS-IV Index Score Difference Critical Value  Significant Difference Y/N Base Rate  Auditory Memory 90 78 12 10.15 Y 15-20%  Visual Memory 91 63 28 9.30 Y 2%  Visual Working Memory 90 83 7 11.68 N   Immediate Memory 89 67 22 11.15 Y 4%  Delayed Memory 90 69 21 11.49 Y 5-10%  Statistical significance (critical value) at the .01 level.    Feedback to Patient: [name] will return on 08/17/2024 for an interactive feedback session with Dr. Corina at which time her test performances, clinical impressions and treatment recommendations will be reviewed in detail. The patient understands she can contact our office should she require our assistance before this time.  150 minutes spent face-to-face with patient administering standardized tests, 30 minutes spent scoring Radiographer, therapeutic). [CPT [id number], [id number]]  Full report to follow.

## 2024-07-26 ENCOUNTER — Encounter: Admitting: Physical Medicine & Rehabilitation

## 2024-07-28 ENCOUNTER — Encounter: Payer: Self-pay | Admitting: Registered Nurse

## 2024-07-28 ENCOUNTER — Encounter: Attending: Physical Medicine and Rehabilitation | Admitting: Registered Nurse

## 2024-07-28 VITALS — BP 144/83 | HR 67 | Ht 61.0 in | Wt 175.4 lb

## 2024-07-28 DIAGNOSIS — M1711 Unilateral primary osteoarthritis, right knee: Secondary | ICD-10-CM | POA: Insufficient documentation

## 2024-07-28 DIAGNOSIS — Z79891 Long term (current) use of opiate analgesic: Secondary | ICD-10-CM | POA: Diagnosis present

## 2024-07-28 DIAGNOSIS — M5416 Radiculopathy, lumbar region: Secondary | ICD-10-CM | POA: Diagnosis not present

## 2024-07-28 DIAGNOSIS — G894 Chronic pain syndrome: Secondary | ICD-10-CM | POA: Insufficient documentation

## 2024-07-28 DIAGNOSIS — Z5181 Encounter for therapeutic drug level monitoring: Secondary | ICD-10-CM | POA: Insufficient documentation

## 2024-07-28 DIAGNOSIS — F0781 Postconcussional syndrome: Secondary | ICD-10-CM | POA: Insufficient documentation

## 2024-07-28 DIAGNOSIS — M961 Postlaminectomy syndrome, not elsewhere classified: Secondary | ICD-10-CM | POA: Diagnosis present

## 2024-07-28 MED ORDER — HYDROCODONE-ACETAMINOPHEN 10-325 MG PO TABS: 1.0000 | ORAL_TABLET | Freq: Four times a day (QID) | ORAL | 0 refills | Status: DC | PRN

## 2024-07-28 MED ORDER — METHYLPHENIDATE HCL 10 MG PO TABS: 10.0000 mg | ORAL_TABLET | Freq: Two times a day (BID) | ORAL | 0 refills | Status: DC

## 2024-07-28 NOTE — Progress Notes (Unsigned)
 Subjective:    Patient ID: [name], female    DOB: Jan 13, 1956, 68 y.o.   MRN: [id number]  HPI: [name] is a 68 y.o. female who returns for follow up appointment for chronic pain and medication refill. She states her pain is located in her lower back radiating into her left lower extremity and bilateral feet with tingling and burning.  She rates her pain 10. Her current exercise regime is walking and performing stretching exercises.  [name] Morphine  equivalent is 40.00 MME. She is also prescribed Alprazolam  .We have discussed the black box warning of using opioids and benzodiazepines. I highlighted the dangers of using these drugs together and discussed the adverse events including respiratory suppression, overdose, cognitive impairment and importance of compliance with current regimen. We will continue to monitor and adjust as indicated.   Last Oral Swab was Performed on 04/26/2024, it was consistent.     Pain Inventory Average Pain 10 Pain Right Now 10 My pain is burning, stabbing, and aching  In the last 24 hours, has pain interfered with the following? General activity 10 Relation with others 10 Enjoyment of life 10 What TIME of day is your pain at its worst? varies Sleep (in general) Good  Pain is worse with: walking, bending, sitting, standing, and some activites Pain improves with: rest, heat/ice, pacing activities, and medication Relief from Meds: 1  Family History  Problem Relation Age of Onset   Cervical cancer Mother    Diabetes Mother    Stroke Mother    Alzheimer's disease Mother    Heart attack Father    Stroke Father    Colon cancer Father    Colon polyps Sister        x 2   Anxiety disorder Maternal Aunt    Depression Maternal Aunt    Anxiety disorder Maternal Uncle    Depression Maternal Uncle        suicide attempt by gun shot wound to head. Survived   Breast cancer Paternal Aunt    Breast cancer Maternal Grandmother 32 - 89    Diabetes Maternal Grandmother    Alcohol  abuse Other    Drug abuse Other    Social History   Socioeconomic History   Marital status: Married    Spouse name: Not on file   Number of children: 0   Years of education: Not on file   Highest education level: Not on file  Occupational History   Occupation: disabled  Tobacco Use   Smoking status: Never   Smokeless tobacco: Never  Vaping Use   Vaping status: Never Used  Substance and Sexual Activity   Alcohol  use: No    Alcohol /week: 0.0 standard drinks of alcohol    Drug use: No   Sexual activity: Yes  Other Topics Concern   Not on file  Social History Narrative   Pt has h.s. degree   Social Drivers of Health   Financial Resource Strain: Low Risk  (12/18/2023)   Received from [facility]   Overall Financial Resource Strain (CARDIA)    Difficulty of Paying Living Expenses: Not very hard  Food Insecurity: No Food Insecurity (12/18/2023)   [facility] from [facility]   Hunger Vital Sign    Within the past 12 months, you worried that your food would run out before you got the money to buy more.: Never true    Within the past 12 months, the food you bought just didn't last and you didn't have money  to get more.: Never true  Transportation Needs: No Transportation Needs (12/18/2023)   Received from [facility]   PRAPARE - Transportation    Lack of Transportation (Medical): No    Lack of Transportation (Non-Medical): No  Physical Activity: Unknown (12/18/2023)   Received from [facility]   Exercise Vital Sign    On average, how many days per week do you engage in moderate to strenuous exercise (like a brisk walk)?: 0 days    Minutes of Exercise per Session: Not on file  Recent Concern: Physical Activity - Inactive (12/18/2023)   Received from [facility]   Exercise Vital Sign    Days of Exercise per Week: 0 days    Minutes of Exercise per Session: 30 min  Stress: Stress Concern Present (12/18/2023)   Received from [facility] - Occupational Stress Questionnaire    Feeling of Stress : Rather much  Social Connections: Socially Isolated (12/18/2023)   Received from [facility]   Social Network    How would you rate your social network (family, work, friends)?: Little participation, lonely and socially isolated   Past Surgical History:  Procedure Laterality Date   ABDOMINAL HYSTERECTOMY  1999   bunions removed  1988   C4 C5 cage insertion  06/28/2013   CHOLECYSTECTOMY  04/25/2008   EDG  12/17/2004   ELECTROCARDIOGRAM  05/27/2007   L5 S1 rod insertion  11/07/2012   SKIN CANCER EXCISION     SKIN CANCER EXCISION     several   TONSILLECTOMY  1981   TUBAL LIGATION  1997   Past Surgical History:  Procedure Laterality Date   ABDOMINAL HYSTERECTOMY  1999   bunions removed  1988   C4 C5 cage insertion  06/28/2013   CHOLECYSTECTOMY  04/25/2008   EDG  12/17/2004   ELECTROCARDIOGRAM  05/27/2007   L5 S1 rod insertion  11/07/2012   SKIN CANCER EXCISION     SKIN CANCER EXCISION     several   TONSILLECTOMY  1981   TUBAL LIGATION  1997   Past Medical History:  Diagnosis Date   Anemia    ANXIETY 06/10/2007   Cataract    Chronic headaches    Degenerative arthritis of spine    back and neck   Depression    DYSLIPIDEMIA 06/28/2008   Esophageal stricture    Fibromyalgia    Gallstones    GERD 06/10/2007   Hiatal hernia    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   HYPERTENSION 06/10/2007   Irritable bowel syndrome 06/28/2008   OSTEOARTHRITIS 06/10/2007   Skin cancer    basal cell   Tubular adenoma of colon    BP (!) 144/83 (BP Location: Left Arm, Patient Position: Sitting)   Pulse 67   Ht 5' 1 (1.549 m)   Wt 175 lb 6.4 oz (79.6 kg)   SpO2 98%   BMI 33.14 kg/m   Opioid Risk Score:   Fall Risk Score:  `1  Depression screen [facility] 2/9     07/28/2024   11:07 AM 04/26/2024    2:49 PM 02/16/2024    3:00 PM 08/25/2023    3:05 PM 06/23/2023    2:23 PM 04/28/2023    2:23 PM 12/30/2022    2:57 PM   Depression screen PHQ 2/9  Decreased Interest 1 1 0 1 1 1 1   Down, Depressed, Hopeless 1 1 0 1 1 1 1   PHQ - 2 Score 2 2 0 2 2 2  2  Altered sleeping 0        Tired, decreased energy 1        Change in appetite 0        Feeling bad or failure about yourself  0        Trouble concentrating 0        Moving slowly or fidgety/restless 0        Suicidal thoughts 0        PHQ-9 Score 3        Difficult doing work/chores Somewhat difficult            Review of Systems  Musculoskeletal:  Positive for back pain and myalgias.       Low back pain radiating down left leg  All other systems reviewed and are negative.      Objective:   Physical Exam Vitals and nursing note reviewed.  Constitutional:      Appearance: Normal appearance.  Cardiovascular:     Rate and Rhythm: Normal rate and regular rhythm.     Pulses: Normal pulses.     Heart sounds: Normal heart sounds.  Pulmonary:     Effort: Pulmonary effort is normal.     Breath sounds: Normal breath sounds.  Musculoskeletal:     Comments: Normal Muscle Bulk and Muscle Testing Reveals:  Upper Extremities: Full ROM and Muscle Strength 5/5  Lumbar Paraspinal Tenderness: L-4-L-5 Lower Extremities: Full ROM and Muscle Strength 5/5 Arises from Chair with ease Narrow Based  Gait     Skin:    General: Skin is warm and dry.  Neurological:     Mental Status: She is alert and oriented to person, place, and time.  Psychiatric:        Mood and Affect: Mood normal.        Behavior: Behavior normal.          Assessment & Plan:  1. History of chronic lumbar facet disease with spondylosis/DDD/ L4-5 spondylolisthesis.With L4-5 L5-S1 decompression stabilization:  She underwent Posterior Lumbar Iinterbody Fusion - Lumabr three-Lumbar four, on 08/01/2021 by Dr Joshua. Neurosurgery Following 07/28/2024 Refilled: Hydrocodone  10mg /325 one tablet every 6 hours as needed for pain #120.  We will continue the opioid monitoring program, this  consists of regular clinic visits, examinations, urine drug screen, pill counts as well as use of [place]  Controlled Substance Reporting system. A 12 month History has been reviewed on the [place]  Controlled Substance Reporting System on 07/28/2024. 2. Depression with anxiety/ PTSD:  Dr. Corina Following.Continue Xanax ,Abilify  and Effexor . 07/28/2024 3. Cervical spondylosis: Stable at this time with no complaints. Continue Medication Regime. 07/28/2024 4. 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 Ritalin : Refilled: Ritalin10 mg  One tablet at Breakfast and Lunch. # 60. Dr Corina Following.  07/28/2024. 5. Muscle Spasm: Continue current medication regimen with  Flexeril : May take 1-2 tablets at HS. 07/28/2024 6. Left Lumbar Radiculitis: No complaints today. Continue HEP as Tolerated . Continue to Monitor. 07/28/2024. 7. Left Knee Osteoarthritis: No complaints today. S/P Left Knee Injection on 10/26/2018 with good relief noted.07/28/2024 8. Right Knee Pain: No complaints today. Continue to alternate with ice and heat therapy. Continue to monitor. 07/28/2024 9. Bilateral Greater Trochanter Tenderness: No complaints today. Continue to alternate Ice and Heat therapy. Continue to monitor. 07/28/2024  10. Forgetfulness:  No complaints today. Dr Corina.Following.  Continue to Monitor. 07/28/2024 11. Left Ankle Pain: No complaints today. No Falls since last visit. Continue to Monitor. 07/28/2024  F/U in 2 months

## 2024-08-03 ENCOUNTER — Encounter: Admitting: Psychology

## 2024-08-03 DIAGNOSIS — F0781 Postconcussional syndrome: Secondary | ICD-10-CM

## 2024-08-03 DIAGNOSIS — G894 Chronic pain syndrome: Secondary | ICD-10-CM

## 2024-08-03 DIAGNOSIS — F411 Generalized anxiety disorder: Secondary | ICD-10-CM

## 2024-08-03 DIAGNOSIS — F4310 Post-traumatic stress disorder, unspecified: Secondary | ICD-10-CM

## 2024-08-14 ENCOUNTER — Encounter: Payer: Self-pay | Admitting: Podiatry

## 2024-08-14 ENCOUNTER — Ambulatory Visit: Admitting: Podiatry

## 2024-08-14 DIAGNOSIS — M7751 Other enthesopathy of right foot: Secondary | ICD-10-CM

## 2024-08-14 MED ORDER — TRIAMCINOLONE ACETONIDE 10 MG/ML IJ SUSP
10.0000 mg | Freq: Once | INTRAMUSCULAR | Status: AC
  Administered 2024-08-14: 10 mg via INTRA_ARTICULAR

## 2024-08-16 NOTE — Progress Notes (Signed)
 Subjective:   Patient ID: [name], female   DOB: 68 y.o.   MRN: [id number]   HPI patient presents stating that the right ankle has been bothering her and that the 1 we worked on is better      ROS      Objective:  Physical Exam  Neurovascular status intact with inflammation of the sinus tarsi left which is improved with sinus tarsi right with fluid buildup around the joint     Assessment:  Inflammatory capsulitis of the right sinus tarsi with the left improved     Plan:  H&P reviewed sterile prep injected the sinus tarsi right 3 mg Kenalog  5 mg Xylocaine  applied sterile dressing and reappoint to recheck as needed

## 2024-08-17 ENCOUNTER — Encounter: Attending: Physical Medicine and Rehabilitation | Admitting: Psychology

## 2024-08-17 ENCOUNTER — Encounter: Payer: Self-pay | Admitting: Psychology

## 2024-08-17 DIAGNOSIS — F411 Generalized anxiety disorder: Secondary | ICD-10-CM | POA: Insufficient documentation

## 2024-08-17 DIAGNOSIS — G3184 Mild cognitive impairment, so stated: Secondary | ICD-10-CM | POA: Insufficient documentation

## 2024-08-17 DIAGNOSIS — F0781 Postconcussional syndrome: Secondary | ICD-10-CM | POA: Insufficient documentation

## 2024-08-17 DIAGNOSIS — G894 Chronic pain syndrome: Secondary | ICD-10-CM | POA: Insufficient documentation

## 2024-08-17 NOTE — Progress Notes (Signed)
 Neuropsychological Consultation   Patient:   [name]   DOB:   1956-08-26  MR Number:  [id number]  Location:  [facility] [address] [place], [address] [place] [address] Dept: [phone]           Date of Service:   08/17/2024  Location of Service and Individuals present: Today's visit was conducted in my outpatient clinic office with the patient myself present.  Start Time:   3 PM End Time:   4 PM  Today's visit was 1 hour in duration and was a face-to-face visit performed in my outpatient clinic office with the patient and her husband present.  Patient Consent and Confidentiality: Limits of confidentiality reviewed including the purpose of our current visit regarding a formal neuropsychological assessment due to reports of progressive worsening in cognition and in particular memory.  Patient consents to proceed with the evaluation.  Consent for Evaluation and Treatment:  Signed:  Yes Explanation of Privacy Policies:  Signed:  Yes Discussion of Confidentiality Limits:  Yes  Provider/Observer:  Norleen Asa, Psy.D.       Clinical Neuropsychologist       Billing Code/Service: [phone]  Chief Complaint:     Chief Complaint  Patient presents with   Memory Loss   Pain   Anxiety   Sleeping Problem   Today's visit was conducted in my outpatient clinic office.  Today is a summary of the feedback visit I had with the patient and her husband.  The patient's complete neuropsychological evaluation has not been finished at this moment due to waiting on the blood work and this will be amended.  Her report will be found in the patient's EMR dated 08/03/2024 but I am waiting for blood work to put the finishing touches on that report.  Reason for Service:    [name] is a 68 year old female presenting for a follow-up assessment regarding cognitive changes. The referral was made  by Dr. Babs. The patient has a history of chronic pain, posttraumatic stress disorder, and postconcussion syndrome. She reports worsening memory difficulties over the past year, particularly with retaining new information, word-finding, and a distorted sense of time. These changes have led to driving cessation. There is a family history of dementia/Alzheimer's disease in her mother. The patient reports increased irritability and anger. Sleep is described as poor. A history of tremors managed with propranolol  is noted.  OBJECTIVE: Neuropsychological testing was completed to evaluate a wide range of cognitive domains, with a specific focus on memory function. Globally, performance was somewhat below expected levels. Memory testing was consistent with overall cognitive functioning, but showed difficulties with new learning (both episodic and semantic memory). The pattern of deficits is not consistent with Alzheimer's disease.  An MRI ordered by Dr. Babs showed mild to moderate chronic microvascular ischemic changes. No acute findings were identified. Recent CBC and metabolic panels are unremarkable. B12, thiamin, and A1C levels from 2023 were within normal limits.  ASSESSMENT: Cognitive changes appear primarily related to a combination of factors including chronic microvascular ischemic disease, residual effects of postconcussion syndrome, chronic pain, and the central nervous system effects of pain medications. The presentation is not consistent with a progressive degenerative neurological condition such as Alzheimer's disease, Lewy body dementia, etc. The patient does not meet criteria for a major neurocognitive disorder. Mood lability, described as a switch flipping in the afternoon, is likely multifactorial, related to medication wearing off,  poor sleep, and chronic pain, rather than a primary mood disorder like bipolar disorder.  PLAN: 1.  Obtain ATN profile and APOE4 genotype testing, as  ordered by Dr. Babs, to further rule out Alzheimer's pathology. The patient was instructed to contact Dr. Lestine office to arrange the blood draw. 2.  Psychoeducation provided regarding the diagnosis of small vessel/chronic microvascular ischemic disease and its effects on cognition. 3.  Reassurance was provided that the current presentation is not indicative of Alzheimer's disease or another progressive neurodegenerative disorder. 4.  Discussed the role of chronic pain and medications (opioids, benzodiazepines) as contributors to cognitive symptoms. Explained the concept of endogenous medications and the body's adaptation, emphasizing careful management. 5.  Reinforced strategies to support cognitive and overall health:     *  Sleep Hygiene: Emphasized the importance of consistent sleep, aligning with natural light cycles (exposure to sunrise/sunset), and avoiding daytime naps if night sleep is poor.     *  Physical Activity: Strongly encouraged daily, safe physical movement. Recommended water aerobics at the [employer] as a low-impact option. Advised to move as much as possible without overdoing it, gradually increasing activity over time.     *  Diet: Reviewed the importance of a diet rich in vegetables, whole grains, nuts, and seeds.     *  Social Connection: Stressed the importance of having a trusted confidant. 6.  Will continue to follow for regular appointments to monitor symptoms.   Diagnosis:    Mild cognitive impairment  Post concussion syndrome  GAD (generalized anxiety disorder)  Chronic pain syndrome        Note: This document was prepared using Dragon voice recognition software and may include unintentional dictation errors.   Electronically Signed   _______________________ Norleen Asa, Psy.D. Clinical Neuropsychologist

## 2024-08-27 ENCOUNTER — Encounter: Payer: Self-pay | Admitting: Physical Medicine & Rehabilitation

## 2024-08-27 ENCOUNTER — Other Ambulatory Visit: Payer: Self-pay | Admitting: Physical Medicine & Rehabilitation

## 2024-08-27 DIAGNOSIS — F411 Generalized anxiety disorder: Secondary | ICD-10-CM

## 2024-08-28 ENCOUNTER — Encounter: Payer: Self-pay | Admitting: *Deleted

## 2024-08-28 NOTE — Telephone Encounter (Signed)
 Opened in error

## 2024-09-18 ENCOUNTER — Other Ambulatory Visit: Payer: Self-pay | Admitting: Physical Medicine & Rehabilitation

## 2024-09-18 DIAGNOSIS — G894 Chronic pain syndrome: Secondary | ICD-10-CM

## 2024-09-18 NOTE — Telephone Encounter (Signed)
Called into [facility]

## 2024-09-27 ENCOUNTER — Encounter: Payer: Self-pay | Admitting: Physical Medicine & Rehabilitation

## 2024-09-27 ENCOUNTER — Encounter: Attending: Physical Medicine and Rehabilitation | Admitting: Physical Medicine & Rehabilitation

## 2024-09-27 VITALS — BP 133/86 | HR 68 | Ht 61.0 in | Wt 171.8 lb

## 2024-09-27 DIAGNOSIS — M961 Postlaminectomy syndrome, not elsewhere classified: Secondary | ICD-10-CM | POA: Insufficient documentation

## 2024-09-27 DIAGNOSIS — F0781 Postconcussional syndrome: Secondary | ICD-10-CM | POA: Diagnosis not present

## 2024-09-27 DIAGNOSIS — G43719 Chronic migraine without aura, intractable, without status migrainosus: Secondary | ICD-10-CM | POA: Diagnosis not present

## 2024-09-27 DIAGNOSIS — G894 Chronic pain syndrome: Secondary | ICD-10-CM | POA: Insufficient documentation

## 2024-09-27 MED ORDER — METHYLPHENIDATE HCL 10 MG PO TABS: 10.0000 mg | ORAL_TABLET | Freq: Two times a day (BID) | ORAL | 0 refills | Status: AC

## 2024-09-27 MED ORDER — HYDROCODONE-ACETAMINOPHEN 10-325 MG PO TABS: 1.0000 | ORAL_TABLET | Freq: Four times a day (QID) | ORAL | 0 refills | Status: DC | PRN

## 2024-09-27 MED ORDER — CYCLOBENZAPRINE HCL 5 MG PO TABS: 5.0000 mg | ORAL_TABLET | Freq: Three times a day (TID) | ORAL | Status: AC | PRN

## 2024-09-27 MED ORDER — TOPIRAMATE 25 MG PO TABS: 25.0000 mg | ORAL_TABLET | Freq: Every day | ORAL | 2 refills | Status: AC

## 2024-09-27 NOTE — Patient Instructions (Signed)
°  VISIT SUMMARY: Today, we discussed your ongoing issues with memory and concentration, chronic pain, migraines, sleep disturbances, and balance. We reviewed your current treatments and made some adjustments to better manage your symptoms and improve your quality of life.  YOUR PLAN: -POSTCONCUSSION SYNDROME: Your memory and focus difficulties are likely due to your previous concussion and are influenced by mood and stress factors. Continue using the cognitive and behavioral strategies discussed with your mental health provider. We will review your pending lab results and notify you if there are any abnormalities.  -CHRONIC PAIN SYNDROME: Your chronic back and musculoskeletal pain is being managed with medications, but you are interested in reducing your medication burden. We discussed the potential benefits of breast reduction surgery for back pain relief and encouraged non-drug pain management strategies like exercise, social engagement, and behavioral interventions. It's important to minimize medication use when possible.  -INTRACTABLE CHRONIC MIGRAINE WITHOUT AURA: You have severe daily afternoon headaches that are consistent with migraines. We have started you on a low dose of Topamax  at bedtime to help prevent migraines. Your vision and dry eye disease are being managed by your ophthalmologist.  -DEPRESSION WITH ANXIETY: Your depression and anxiety are worsened by family stress and social isolation. You are under the care of a mental health provider and are motivated to reduce medication use. Continue with behavioral activation, socialization, and exercise as part of your treatment. We reviewed your current medications and noted that you have stopped taking Wellbutrin  on your own.  -GAIT DISTURBANCE AND BALANCE IMPAIRMENT: Your gait and balance have improved with increased home ambulation and supportive footwear. Continue with these practices to maintain your progress.  INSTRUCTIONS: We will  review your pending lab results and notify you if there are any abnormalities. Continue attending your monthly psychotherapy sessions and follow the discussed strategies for managing your symptoms. If you have any new or worsening symptoms, please contact our office.

## 2024-09-27 NOTE — Progress Notes (Signed)
 Subjective:    Patient ID: [name], female    DOB: 26-May-1956, 68 y.o.   MRN: [id number]  HPI  Discussed the use of AI scribe software for clinical note transcription with the patient, who gave verbal consent to proceed.  History of Present Illness [name] is a 68 year old female with postconcussional syndrome, chronic pain, and chronic migraine who presents for follow-up of persistent neurological deficits, pain, and headache management.  Cognitive dysfunction and postconcussional symptoms - Persistent memory impairment and difficulty with concentration following concussion. - Symptoms attributed in part to cumulative psychosocial stressors, including longstanding family conflict and depression. - Attends monthly psychotherapy sessions.  Chronic pain syndrome - Significant ongoing pain despite use of hydrocodone  and cyclobenzaprine  as needed. - Daily venlafaxine  and methylphenidate  for pain and mood management. - Discontinued bupropion  independently. - Expresses interest in reducing medication burden, particularly muscle relaxants and mood stabilizers. - Evaluating breast reduction surgery to alleviate back pain.  Migraine and headache - Daily headaches, typically beginning in the temples around 3 PM and intensifying throughout the day. - Pain is initially dull and may become severe. - Acetaminophen  and current analgesics do not provide relief. - Longstanding history of migraines since her twenties. - Recent ophthalmologic evaluation unchanged; diagnosed with dry eye disease and using Restasis  and new eye drops with some improvement. - Does not believe headaches are related to ocular symptoms.  Sleep disturbance - Variable sleep quality, with intermittent nights of adequate rest and others disrupted by vivid, unusual dreams. - No consistent correlation observed between sleep quality and headache severity.  Gait disturbance and balance impairment - Increased  ambulation at home. - Improved gait and balance after switching to footwear that encourages increased foot clearance.  Psychosocial stressors and emotional health - Longstanding family conflict contributing to psychosocial stress. - Recently ended relationships with two sisters, which is emotionally relieving. - Does not participate in family gatherings and has not attended her sister's house for Christmas for several years. - Increased social engagement, including outings with friends and spending time outdoors with her partner.  Laboratory evaluation - Laboratory tests drawn in September reportedly negative per another provider, though she has not personally reviewed the results.   Pain Inventory Average Pain 10 Pain Right Now 9 My pain is sharp, burning, dull, stabbing, and aching  In the last 24 hours, has pain interfered with the following? General activity 10 Relation with others 10 Enjoyment of life 10 What TIME of day is your pain at its worst? evening Sleep (in general) Fair  Pain is worse with: walking, bending, sitting, standing, and some activites Pain improves with: rest, heat/ice, pacing activities, medication, and injections Relief from Meds: 5  Family History  Problem Relation Age of Onset   Cervical cancer Mother    Diabetes Mother    Stroke Mother    Alzheimer's disease Mother    Heart attack Father    Stroke Father    Colon cancer Father    Colon polyps Sister        x 2   Anxiety disorder Maternal Aunt    Depression Maternal Aunt    Anxiety disorder Maternal Uncle    Depression Maternal Uncle        suicide attempt by gun shot wound to head. Survived   Breast cancer Paternal Aunt    Breast cancer Maternal Grandmother 85 - 89   Diabetes Maternal Grandmother    Alcohol  abuse Other    Drug abuse  Other    Social History   Socioeconomic History   Marital status: Married    Spouse name: Not on file   Number of children: 0   Years of education: Not  on file   Highest education level: Not on file  Occupational History   Occupation: disabled  Tobacco Use   Smoking status: Never   Smokeless tobacco: Never  Vaping Use   Vaping status: Never Used  Substance and Sexual Activity   Alcohol  use: No    Alcohol /week: 0.0 standard drinks of alcohol    Drug use: No   Sexual activity: Yes  Other Topics Concern   Not on file  Social History Narrative   Pt has h.s. degree   Social Drivers of Health   Tobacco Use: Low Risk (09/27/2024)   Patient History    Smoking Tobacco Use: Never    Smokeless Tobacco Use: Never    Passive Exposure: Not on file  Financial Resource Strain: Low Risk (12/18/2023)   Received from [facility]   Overall Financial Resource Strain (CARDIA)    Difficulty of Paying Living Expenses: Not very hard  Food Insecurity: No Food Insecurity (12/18/2023)   Received from [facility]   Epic    Within the past 12 months, you worried that your food would run out before you got the money to buy more.: Never true    Within the past 12 months, the food you bought just didn't last and you didn't have money to get more.: Never true  Transportation Needs: No Transportation Needs (12/18/2023)   Received from [facility] - Transportation    Lack of Transportation (Medical): No    Lack of Transportation (Non-Medical): No  Physical Activity: Unknown (12/18/2023)   Received from [facility]   Exercise Vital Sign    On average, how many days per week do you engage in moderate to strenuous exercise (like a brisk walk)?: 0 days    Minutes of Exercise per Session: Not on file  Recent Concern: Physical Activity - Inactive (12/18/2023)   Received from [facility]   Exercise Vital Sign    Days of Exercise per Week: 0 days    Minutes of Exercise per Session: 30 min  Stress: Stress Concern Present (12/18/2023)   Received from [facility] - Occupational Stress Questionnaire     Feeling of Stress : Rather much  Social Connections: Socially Isolated (12/18/2023)   Received from [facility]   Social Network    How would you rate your social network (family, work, friends)?: Little participation, lonely and socially isolated  Depression (PHQ2-9): Low Risk (07/28/2024)   Depression (PHQ2-9)    PHQ-2 Score: 3  Alcohol  Screen: Not on file  Housing: Low Risk (12/20/2023)   Received from [facility]    In the last 12 months, was there a time when you were not able to pay the mortgage or rent on time?: No    In the past 12 months, how many times have you moved where you were living?: 1    At any time in the past 12 months, were you homeless or living in a shelter (including now)?: No  Utilities: Not At Risk (12/18/2023)   Received from [facility] Utilities    Threatened with loss of utilities: No  Health Literacy: Not on file   Past Surgical History:  Procedure Laterality Date   ABDOMINAL HYSTERECTOMY  1999   bunions removed  1988   C4 C5 cage insertion  06/28/2013   CHOLECYSTECTOMY  04/25/2008   EDG  12/17/2004   ELECTROCARDIOGRAM  05/27/2007   L5 S1 rod insertion  11/07/2012   SKIN CANCER EXCISION     SKIN CANCER EXCISION     several   TONSILLECTOMY  1981   TUBAL LIGATION  1997   Past Surgical History:  Procedure Laterality Date   ABDOMINAL HYSTERECTOMY  1999   bunions removed  1988   C4 C5 cage insertion  06/28/2013   CHOLECYSTECTOMY  04/25/2008   EDG  12/17/2004   ELECTROCARDIOGRAM  05/27/2007   L5 S1 rod insertion  11/07/2012   SKIN CANCER EXCISION     SKIN CANCER EXCISION     several   TONSILLECTOMY  1981   TUBAL LIGATION  1997   Past Medical History:  Diagnosis Date   Anemia    ANXIETY 06/10/2007   Cataract    Chronic headaches    Degenerative arthritis of spine    back and neck   Depression    DYSLIPIDEMIA 06/28/2008   Esophageal stricture    Fibromyalgia    Gallstones    GERD 06/10/2007   Hiatal hernia    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    HYPERTENSION 06/10/2007   Irritable bowel syndrome 06/28/2008   OSTEOARTHRITIS 06/10/2007   Skin cancer    basal cell   Tubular adenoma of colon    BP 133/86 (BP Location: Left Arm, Patient Position: Sitting, Cuff Size: Normal)   Pulse 68   Ht 5' 1 (1.549 m)   Wt 171 lb 12.8 oz (77.9 kg)   SpO2 97%   BMI 32.46 kg/m   Opioid Risk Score:   Fall Risk Score:  `1  Depression screen [facility] 2/9     07/28/2024   11:07 AM 04/26/2024    2:49 PM 02/16/2024    3:00 PM 08/25/2023    3:05 PM 06/23/2023    2:23 PM 04/28/2023    2:23 PM 12/30/2022    2:57 PM  Depression screen PHQ 2/9  Decreased Interest 1 1 0 1 1 1 1   Down, Depressed, Hopeless 1 1 0 1 1 1 1   PHQ - 2 Score 2 2 0 2 2 2 2   Altered sleeping 0        Tired, decreased energy 1        Change in appetite 0        Feeling bad or failure about yourself  0        Trouble concentrating 0        Moving slowly or fidgety/restless 0        Suicidal thoughts 0        PHQ-9 Score 3         Difficult doing work/chores Somewhat difficult           Data saved with a previous flowsheet row definition      Review of Systems  Musculoskeletal:  Positive for arthralgias, back pain and myalgias.       Left hip  pain, low back pain with sciatica, left thigh pain  Neurological:  Positive for headaches.       Headaches daily in temples       Objective:   Physical Exam  General: Alert and oriented x 3, No apparent distress HEENT: Head is normocephalic, atraumatic, PERRLA, EOMI, sclera anicteric, oral mucosa pink and moist, dentition intact, ext ear canals clear,  Neck:  Supple without JVD or lymphadenopathy Heart: Reg rate and rhythm. No murmurs rubs or gallops Chest: CTA bilaterally without wheezes, rales, or rhonchi; no distress Abdomen: Soft, non-tender, non-distended, bowel sounds positive. Extremities: No clubbing, cyanosis, or edema. Pulses are 2+ Psych: Pt's affect is appropriate. Pt is cooperative Skin: Clean and intact without  signs of breakdown Neuro:  Alert and oriented x 3. Normal insight and awareness. Mild STM and attention deficits. . Normal language and speech. Cranial nerve exam unremarkable. MMT: 5/5 all r's. Improved balance. No shuffling! .   Musculoskeletal: Full ROM, No pain with AROM or PROM in the neck,  or extremities. LB TTP. Posture appropriate          1. 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 s/p L4-5 L5-S1 decompression stabilization with substantial mprovement of her back and right leg pain.  2. Bilateral knee pain, OA  3. Depression with anxiety.  Seasonal exacerbation 4. Cervical spondylosis/Myofascial pain.  5. Concussion on 05/13/14 with post concussion syndrome-   6. 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 Right breast mass--workup pending           PLAN:  1. Continue ritalin  10mg  for attention/arousal---refilled #60--. today   2. on xanax  at 0.5mg bid #60.   -continue effexor   150 mg daily  - buspar   10mg  tid -continue with social reintegration.  3. Hydrocodone  will continue for breakthrough pain 10/325 q6 prn #120.  . .  We will continue the controlled substance monitoring program, this consists of regular clinic visits, examinations, routine drug screening, pill counts as well as use of   Controlled Substance Reporting System. [place].   4. Continue with counseling with Dr. Corina which has been extremely helpful.        -          -xanax  refilled as above -social interaction with friends and perhaps through exercise. Going out to eat -continue with church and friends. .  5. Knee pain. Much improved. -continue diclofenac  50mg  every day prn with food             -  pepcid  20mg  bid             -asked her to stop at any sign of nausea -knee strengthening exercises should continue -weight loss has been discussed. Breast reduction? 6. Shuffling gait, LOB,  hyperreflexia--seems to have improved  -MRI negative          -needs to use small based cane for balance. 7.  Migraine headache  -trial of topamax    7. Follow up in about 2 months . 20 minutes of face to face patient care time were spent during this visit. All questions were encouraged and answered.

## 2024-10-25 ENCOUNTER — Other Ambulatory Visit: Payer: Self-pay

## 2024-10-25 DIAGNOSIS — G894 Chronic pain syndrome: Secondary | ICD-10-CM

## 2024-10-25 DIAGNOSIS — M961 Postlaminectomy syndrome, not elsewhere classified: Secondary | ICD-10-CM

## 2024-10-25 MED ORDER — HYDROCODONE-ACETAMINOPHEN 10-325 MG PO TABS: 1.0000 | ORAL_TABLET | Freq: Four times a day (QID) | ORAL | 0 refills | Status: AC | PRN

## 2024-10-25 NOTE — Telephone Encounter (Signed)
 Patient called for her Hydrocodone  10-325 mg refill. Please send to [facility] on [address]. Thank you.

## 2024-11-16 ENCOUNTER — Encounter: Payer: Self-pay | Admitting: Psychology

## 2024-11-16 NOTE — Progress Notes (Signed)
 Neuropsychological Evaluation   Patient:  [name]   DOB: 05/25/1956  MR Number: [id number]  Location: [facility] [address] [place], [address] [place] [address] Dept: [phone]  Start: 4 PM End: 5 PM  Provider/Observer:     Norleen JONELLE Asa PsyD  Chief Complaint:      Chief Complaint  Patient presents with   Memory Loss   Anxiety   Depression   Fatigue   Pain   Sleeping Problem   Stress    Reason For Service:     [name], a 69 year old female, was referred by Dr. Babs for follow-up neuropsychological assessment due to reported cognitive decline. She and her husband described over one year of memory changes with recent rapid worsening. Primary concerns include reduced ability to retain new information, word-finding difficulties, episodes of geographic disorientation, difficulty tracking time, and functional decline resulting in cessation of driving. There is a significant maternal history of dementia/Alzheimer's disease. This evaluation aims to assess current cognitive functioning, clarify the nature and extent of deficits, and provide diagnostic impressions and treatment recommendations.  Clinical Background / History:  Presenting Concerns The patient reports progressive memory decline for approximately one year, described as moving too fast in recent months. She endorsed difficulty retaining new information, losing track of time, forgetting conversations and names, and experiencing episodes of disorientation. Cueing does not reliably improve recall. She no longer drives due to safety concerns raised by her husband. She reports misplacing a prescription for methylphenidate , resulting in significant distress.  Psychiatric and Psychological History The patient has a longstanding history of posttraumatic stress disorder (PTSD), generalized anxiety disorder  (GAD), panic disorder, and depressive symptoms. She is also followed for postconcussion syndrome and chronic pain. She reports increased irritability and anger, which represents a change from baseline personality. There is no history of hallucinations. Sleep is generally adequate. A history of tremors--worsened by stress--is treated with propranolol .  Medical History Notable medical conditions include: Hypertension, hyperlipidemia Chronic headaches, fibromyalgia, chronic pain syndrome Osteoarthritis, cervical and lumbosacral spondylosis, osteopenia GERD, IBS, esophageal stricture, hiatal hernia Past anemia HSV, cataracts Gait disorder S/P lumbar fusion COVID-19 infection (2022) Hypothyroidism Tubular adenoma of colon Basal cell carcinoma (history)  Family Medical/Psychiatric History Mother: dementia/Alzheimer's disease, cervical cancer, diabetes, stroke Father: heart attack, stroke, colon cancer Maternal relatives: anxiety disorders, depression, suicide attempt Multiple relatives with cancer, substance use disorders This history is notable for increased genetic risk for Alzheimer's disease.  Neuroimaging MRI ordered by Dr. Babs revealed mild to moderate chronic microvascular ischemic changes, nonspecific but potentially contributory. No acute abnormalities.  Laboratory Findings CBC and metabolic panel this week: unremarkable. Past B12 and thiamine levels normal. Hemoglobin/hematocrit abnormalities from three years ago have resolved. A1C well controlled. Medication adherence confirmed in July labs.  Social History Patient lives with her husband, [name], with whom she shares a longstanding marriage. No concerns regarding substance use.  Behavioral Observations / Mental Status The patient arrived well-groomed and appropriately dressed. She was alert, cooperative, and motivated. Speech was normal in rate and quality, though she endorsed subjective word-finding difficulty. Thought  processes were coherent and organized; thought content reflected frustration and worry about cognitive decline. No suicidal or homicidal ideation. Orientation appeared intact during interview. Affect was appropriate and congruent with mood. Insight into deficits was present. Attention and concentration were adequate conversationally. She occasionally reported going blank during testing and showed self-critical reactions.  Tests Administered Controlled Oral  Word Association Test (COWAT; FAS & Animals) Grooved Pegboard Test Trail Making Test: Parts A & B Wechsler Adult Intelligence Scale - Fourth Edition (WAIS-IV) Wechsler Memory Scale - Fourth Edition (WMS-IV), Adult Battery  Participation Level:   Active  Participation Quality:  Appropriate      Behavioral Observation:  Well Groomed, Alert, and Appropriate.   Test Results:     COWAT: FAS total= 25 Z= -1.0 Animals total= 9  Z= -1.83  Grooved Pegboard:  ** Pt is ambidextrous, but uses her right hand to write.   R (DH) time= 135s Drops= 0  Percentile Rank= 26th L (NDH) time= 112s Drops= 0 Percentile Rank= 34th   TMT:  Trails A time= 45s Errors= 0  Percentile Rank= 44th Trails B time= 154s Errors= 1  Percentile Rank= 39th   WAIS-IV:              Composite Score Summary          Scale Sum of Scaled Scores Composite Score Percentile Rank 95% Conf. Interval Qualitative Description  Verbal Comprehension 20 VCI 81 10 76-87 Low Average  Perceptual Reasoning 19 PRI 79 8 74-86 Borderline  Working Memory 13 WMI 80 9 74-88 Low Average  Processing Speed 10 PSI 74 4 68-85 Borderline  Full Scale 62 FSIQ 74 4 70-79 Borderline  General Ability 39 GAI 78 7 74-84 Borderline            Verbal Comprehension Subtests Summary        Subtest Raw Score Scaled Score Percentile Rank Reference Group Scaled Score SEM  Similarities 17 7 16 6  1.04  Vocabulary 28 8 25 8  0.67  Information 6 5 5 6  0.73            Perceptual Reasoning  Subtests Summary        Subtest Raw Score Scaled Score Percentile Rank Reference Group Scaled Score SEM  Block Design 24 8 25 6  1.08  Matrix Reasoning 5 5 5 2  0.90  Visual Puzzles 6 6 9 4  0.85          Working Comptroller Raw Score Scaled Score Percentile Rank Reference Group Scaled Score SEM  Digit Span 22 8 25 7  0.79  Arithmetic 8 5 5 5  0.99          Processing Speed Subtests Summary        Subtest Raw Score Scaled Score Percentile Rank Reference Group Scaled Score SEM  Symbol Search 12 4 2 3  1.31  Coding 33 6 9 3  0.99   WMS-IV:            Index Score Summary        Index Sum of Scaled Scores Index Score Percentile Rank 95% Confidence Interval Qualitative Descriptor  Auditory Memory (AMI) 25 78 7 73-85 Borderline  Visual Memory (VMI) 17 63 1 59-70 Extremely Low  Visual Working Memory (VWMI) 14 83 13 77-91 Low Average  Immediate Memory (IMI) 20 67 1 62-75 Extremely Low  Delayed Memory (DMI) 22 69 2 64-78 Extremely Low           Primary Subtest Scaled Score Summary       Subtest Domain Raw Score Scaled Score Percentile Rank  Logical Memory I AM 17 6 9   Logical Memory II AM 5 3 1   Verbal Paired Associates I AM 16 7 16   Verbal Paired Associates II AM 7 9 37  Designs I VM 36  2 0.4  Designs II VM 44 9 37  Visual Reproduction I VM 21 5 5   Visual Reproduction II VM 0 1 0.1  Spatial Addition VWM 6 7 16           Auditory Memory Process Score Summary      Process Score Raw Score Scaled Score Percentile Rank Cumulative Percentage (Base Rate)  LM II Recognition 23 - - 26-50%  VPA II Recognition 35 - - 17-25%         Visual Memory Process Score Summary      Process Score Raw Score Scaled Score Percentile Rank Cumulative Percentage (Base Rate)  DE I Content 25 6 9  -  DE I Spatial 5 2 0.4 -  DE II Content 28 8 25  -  DE II Spatial 12 10 50 -  DE II Recognition 13 - - 26-50%  VR II Recognition 3 - - 10-16%    ABILITY-MEMORY ANALYSIS   Ability  Score:    VCI: 81 Date of Testing:           WAIS-IV; WMS-IV 2024/07/10             Predicted Difference Method    Index Predicted WMS-IV Index Score Actual WMS-IV Index Score Difference Critical Value   Significant Difference Y/N Base Rate  Auditory Memory 90 78 12 10.15 Y 15-20%  Visual Memory 91 63 28 9.30 Y 2%  Visual Working Memory 90 83 7 11.68 N    Immediate Memory 89 67 22 11.15 Y 4%  Delayed Memory 90 69 21 11.49 Y 5-10%    Neuropsychological Test Results   1. Attention, Processing Speed & Executive Functioning Trail Making Test Part A: 44th percentile (Low Average) Trail Making Test Part B: 39th percentile (Low Average) WAIS-IV Processing Speed Index (PSI = 74; 4th percentile, Borderline) COWAT phonemic fluency: Z = -1.0 (Low Average) Semantic fluency: Z = -1.83 (Borderline) Findings indicate slowed processing speed, reduced cognitive efficiency, and mild executive weaknesses.  2. Motor Functioning Grooved Pegboard performance was low average, bilaterally (26th and 34th percentiles), with no dropped pegs. Motor control and dexterity were intact.  3. Intellectual Functioning (WAIS-IV) Domain Score Percentile Description  Verbal Comprehension (VCI) 81 10th Low Average  Perceptual Reasoning (PRI) 79 8th Borderline  Working Memory (WMI) 80 9th Low Average  Processing Speed (PSI) 74 4th Borderline  Full Scale IQ 74 4th Borderline  Overall intellectual functioning falls in the borderline range, with relative strengths in verbal reasoning and working memory and weaknesses in processing speed and nonverbal reasoning.  4. Memory Functioning (WMS-IV) Significant impairments were observed. Index Score Percentile Interpretation  Auditory Memory 78 7th Borderline  Visual Memory 63 1st Extremely Low  Visual Working Memory 83 13th Low Average  Immediate Memory 67 1st Extremely Low  Delayed Memory 69 2nd Extremely Low  Subtests showed: Severely impaired delayed recall  for both auditory and visual material Visual Reproduction II score: 1 (0.1 percentile) Minimal benefit from recognition cues Ability-Memory Discrepancy Analysis demonstrated several memory scores significantly below expectation based on intellectual ability, with base rates ranging from 2%-20%, indicating that impairments are clinically meaningful and unlikely due to normal variability.  Summary & Clinical Impression  Results indicate a broad pattern of cognitive impairment, with the most severe deficits in immediate and delayed memory, particularly for visual material, along with reduced processing speed and semantic fluency. Intellectual functioning is within the borderline range, but memory deficits fall significantly below what would be expected based on her  abilities. The patient does have a history of longstanding cognitive difficulties following concussive event but more recent worsening memory, family history of dementia/Alzheimer's disease.  MRI findings of microvascular ischemic change, and significant discrepancies between predicted and actual memory functioning there are ongoing concerns for a neurodegenerative process, such as major neurocognitive disorder.  However, the clinical history and review of overall cognitive functioning appear to be more related to a combination of factors including chronic microvascular ischemic disease, residual effects of postconcussion syndrome, chronic pain, and the central nervous system effects of pain medications.   The overall presentation is not consistent with a progressive degenerative neurological condition such as Alzheimer's disease, Lewy body dementia, etc. The patient does not meet criteria for a major neurocognitive disorder/neurodegenerative process. Mood lability, described as a switch flipping in the afternoon, is likely multifactorial, related to medication wearing off, poor sleep, and chronic pain, rather than a primary mood disorder like  bipolar disorder. Psychiatric factors (PTSD, anxiety, depression) and chronic pain may exacerbate symptoms but do not fully account for the severity or pattern of memory deficits observed.  Diagnoses Memory Loss Postconcussion Syndrome Chronic Pain Syndrome Generalized Anxiety Disorder (GAD) Post-traumatic Stress Disorder (PTSD) (Consider: Major Neurocognitive Disorder--rule out; recommended further evaluation)  Recommendations / Plan  Additional Medical Workup Proceed with ordered ATN profile and APOE4 genotyping to further assess risk for Alzheimer's disease. Continue neurological follow-up. Safety Driving cessation remains appropriate. Encourage supervision for medication management due to misplacement of controlled substances. Treatment / Support Cognitive rehabilitation and compensatory strategy training. Continued psychotherapy for coping with cognitive changes and emotional distress. Discuss potential for medication trials targeting memory (per neurology). Lifestyle & Health Management Anti-inflammatory diet: minimize refined carbohydrates; increase whole grains, vegetables, turmeric. Maintain sleep hygiene and regular physical activity. Continue management of cardiovascular risk factors. Follow-Up Results forwarded to Dr. Babs and neurology. Patient will return for a feedback session to review findings in detail.  Diagnosis:    Post concussion syndrome  GAD (generalized anxiety disorder)  Chronic pain syndrome  PTSD (post-traumatic stress disorder)   _____________________ Norleen Asa, Psy.D. Clinical Neuropsychologist

## 2024-12-14 ENCOUNTER — Encounter: Admitting: Psychology

## 2024-12-27 ENCOUNTER — Encounter: Admitting: Physical Medicine & Rehabilitation
